# Patient Record
Sex: Male | Born: 1935 | Race: Black or African American | Hispanic: No | Marital: Married | Smoking: Former smoker
Health system: Southern US, Community
[De-identification: ages and names within clinical notes are randomized; demographics above are authoritative.]

## PROBLEM LIST (undated history)

## (undated) DIAGNOSIS — K279 Peptic ulcer, site unspecified, unspecified as acute or chronic, without hemorrhage or perforation: Secondary | ICD-10-CM

## (undated) DIAGNOSIS — E119 Type 2 diabetes mellitus without complications: Secondary | ICD-10-CM

## (undated) DIAGNOSIS — M5126 Other intervertebral disc displacement, lumbar region: Secondary | ICD-10-CM

## (undated) DIAGNOSIS — K219 Gastro-esophageal reflux disease without esophagitis: Secondary | ICD-10-CM

## (undated) DIAGNOSIS — G4730 Sleep apnea, unspecified: Secondary | ICD-10-CM

## (undated) DIAGNOSIS — D369 Benign neoplasm, unspecified site: Secondary | ICD-10-CM

## (undated) DIAGNOSIS — M1990 Unspecified osteoarthritis, unspecified site: Secondary | ICD-10-CM

## (undated) DIAGNOSIS — I10 Essential (primary) hypertension: Secondary | ICD-10-CM

## (undated) DIAGNOSIS — N400 Enlarged prostate without lower urinary tract symptoms: Secondary | ICD-10-CM

## (undated) DIAGNOSIS — D469 Myelodysplastic syndrome, unspecified: Principal | ICD-10-CM

## (undated) DIAGNOSIS — E785 Hyperlipidemia, unspecified: Secondary | ICD-10-CM

## (undated) DIAGNOSIS — K5790 Diverticulosis of intestine, part unspecified, without perforation or abscess without bleeding: Secondary | ICD-10-CM

## (undated) HISTORY — DX: Other intervertebral disc displacement, lumbar region: M51.26

## (undated) HISTORY — DX: Hyperlipidemia, unspecified: E78.5

## (undated) HISTORY — PX: NECK SURGERY: SHX720

## (undated) HISTORY — DX: Essential (primary) hypertension: I10

## (undated) HISTORY — DX: Myelodysplastic syndrome, unspecified: D46.9

## (undated) HISTORY — DX: Sleep apnea, unspecified: G47.30

## (undated) HISTORY — DX: Diverticulosis of intestine, part unspecified, without perforation or abscess without bleeding: K57.90

## (undated) HISTORY — DX: Benign neoplasm, unspecified site: D36.9

## (undated) HISTORY — PX: ESOPHAGOGASTRODUODENOSCOPY: SHX1529

## (undated) HISTORY — DX: Peptic ulcer, site unspecified, unspecified as acute or chronic, without hemorrhage or perforation: K27.9

## (undated) HISTORY — DX: Gastro-esophageal reflux disease without esophagitis: K21.9

## (undated) HISTORY — PX: COLONOSCOPY: SHX174

## (undated) HISTORY — DX: Benign prostatic hyperplasia without lower urinary tract symptoms: N40.0

## (undated) HISTORY — DX: Unspecified osteoarthritis, unspecified site: M19.90

## (undated) HISTORY — DX: Type 2 diabetes mellitus without complications: E11.9

---

## 1998-04-14 ENCOUNTER — Encounter: Admission: RE | Admit: 1998-04-14 | Discharge: 1998-04-14 | Payer: Self-pay | Admitting: Family Medicine

## 1998-05-19 ENCOUNTER — Encounter: Admission: RE | Admit: 1998-05-19 | Discharge: 1998-05-19 | Payer: Self-pay | Admitting: Family Medicine

## 1998-08-08 ENCOUNTER — Encounter (INDEPENDENT_AMBULATORY_CARE_PROVIDER_SITE_OTHER): Payer: Self-pay | Admitting: *Deleted

## 1998-08-08 ENCOUNTER — Other Ambulatory Visit: Admission: RE | Admit: 1998-08-08 | Discharge: 1998-08-08 | Payer: Self-pay | Admitting: Gastroenterology

## 1998-09-17 ENCOUNTER — Encounter: Admission: RE | Admit: 1998-09-17 | Discharge: 1998-09-17 | Payer: Self-pay | Admitting: Family Medicine

## 1998-10-02 ENCOUNTER — Encounter: Admission: RE | Admit: 1998-10-02 | Discharge: 1998-10-02 | Payer: Self-pay | Admitting: Family Medicine

## 1998-10-17 ENCOUNTER — Encounter: Admission: RE | Admit: 1998-10-17 | Discharge: 1998-10-17 | Payer: Self-pay | Admitting: Family Medicine

## 1998-11-11 ENCOUNTER — Encounter: Admission: RE | Admit: 1998-11-11 | Discharge: 1998-11-11 | Payer: Self-pay | Admitting: *Deleted

## 1998-11-21 ENCOUNTER — Ambulatory Visit (HOSPITAL_BASED_OUTPATIENT_CLINIC_OR_DEPARTMENT_OTHER): Admission: RE | Admit: 1998-11-21 | Discharge: 1998-11-21 | Payer: Self-pay

## 1998-12-16 ENCOUNTER — Encounter: Admission: RE | Admit: 1998-12-16 | Discharge: 1998-12-16 | Payer: Self-pay | Admitting: Sports Medicine

## 1999-01-02 ENCOUNTER — Encounter: Admission: RE | Admit: 1999-01-02 | Discharge: 1999-01-02 | Payer: Self-pay | Admitting: Family Medicine

## 1999-01-19 ENCOUNTER — Ambulatory Visit (HOSPITAL_COMMUNITY): Admission: RE | Admit: 1999-01-19 | Discharge: 1999-01-19 | Payer: Self-pay | Admitting: Sports Medicine

## 1999-02-02 ENCOUNTER — Encounter: Admission: RE | Admit: 1999-02-02 | Discharge: 1999-02-02 | Payer: Self-pay | Admitting: Family Medicine

## 1999-02-10 ENCOUNTER — Encounter: Admission: RE | Admit: 1999-02-10 | Discharge: 1999-02-10 | Payer: Self-pay | Admitting: Family Medicine

## 1999-02-11 ENCOUNTER — Encounter: Payer: Self-pay | Admitting: Emergency Medicine

## 1999-02-11 ENCOUNTER — Emergency Department (HOSPITAL_COMMUNITY): Admission: EM | Admit: 1999-02-11 | Discharge: 1999-02-11 | Payer: Self-pay | Admitting: Emergency Medicine

## 1999-02-11 ENCOUNTER — Encounter: Admission: RE | Admit: 1999-02-11 | Discharge: 1999-02-11 | Payer: Self-pay | Admitting: Family Medicine

## 1999-02-16 ENCOUNTER — Encounter: Admission: RE | Admit: 1999-02-16 | Discharge: 1999-02-16 | Payer: Self-pay | Admitting: Family Medicine

## 1999-03-04 ENCOUNTER — Encounter: Admission: RE | Admit: 1999-03-04 | Discharge: 1999-03-04 | Payer: Self-pay | Admitting: Family Medicine

## 1999-03-10 ENCOUNTER — Ambulatory Visit (HOSPITAL_BASED_OUTPATIENT_CLINIC_OR_DEPARTMENT_OTHER): Admission: RE | Admit: 1999-03-10 | Discharge: 1999-03-10 | Payer: Self-pay | Admitting: Urology

## 1999-03-25 ENCOUNTER — Encounter: Admission: RE | Admit: 1999-03-25 | Discharge: 1999-03-25 | Payer: Self-pay | Admitting: Family Medicine

## 1999-04-08 ENCOUNTER — Encounter: Admission: RE | Admit: 1999-04-08 | Discharge: 1999-04-08 | Payer: Self-pay | Admitting: Family Medicine

## 1999-06-23 ENCOUNTER — Encounter: Payer: Self-pay | Admitting: Sports Medicine

## 1999-06-23 ENCOUNTER — Encounter: Admission: RE | Admit: 1999-06-23 | Discharge: 1999-06-23 | Payer: Self-pay | Admitting: Sports Medicine

## 1999-07-03 ENCOUNTER — Encounter: Admission: RE | Admit: 1999-07-03 | Discharge: 1999-07-03 | Payer: Self-pay | Admitting: Sports Medicine

## 1999-07-23 ENCOUNTER — Encounter: Admission: RE | Admit: 1999-07-23 | Discharge: 1999-07-23 | Payer: Self-pay | Admitting: Family Medicine

## 1999-08-03 ENCOUNTER — Encounter: Admission: RE | Admit: 1999-08-03 | Discharge: 1999-08-03 | Payer: Self-pay | Admitting: Family Medicine

## 1999-10-28 ENCOUNTER — Encounter: Admission: RE | Admit: 1999-10-28 | Discharge: 1999-10-28 | Payer: Self-pay | Admitting: Family Medicine

## 1999-10-30 ENCOUNTER — Encounter: Payer: Self-pay | Admitting: Family Medicine

## 1999-10-30 ENCOUNTER — Encounter: Admission: RE | Admit: 1999-10-30 | Discharge: 1999-10-30 | Payer: Self-pay | Admitting: *Deleted

## 1999-11-16 ENCOUNTER — Encounter: Admission: RE | Admit: 1999-11-16 | Discharge: 1999-11-16 | Payer: Self-pay | Admitting: Family Medicine

## 1999-12-23 ENCOUNTER — Encounter: Admission: RE | Admit: 1999-12-23 | Discharge: 1999-12-23 | Payer: Self-pay | Admitting: Family Medicine

## 2000-02-08 ENCOUNTER — Encounter: Admission: RE | Admit: 2000-02-08 | Discharge: 2000-02-08 | Payer: Self-pay | Admitting: Family Medicine

## 2000-02-08 ENCOUNTER — Encounter: Payer: Self-pay | Admitting: *Deleted

## 2000-02-08 ENCOUNTER — Encounter: Admission: RE | Admit: 2000-02-08 | Discharge: 2000-02-08 | Payer: Self-pay | Admitting: *Deleted

## 2000-02-24 ENCOUNTER — Encounter: Admission: RE | Admit: 2000-02-24 | Discharge: 2000-02-24 | Payer: Self-pay | Admitting: Family Medicine

## 2000-04-06 ENCOUNTER — Encounter: Admission: RE | Admit: 2000-04-06 | Discharge: 2000-04-06 | Payer: Self-pay | Admitting: Family Medicine

## 2000-04-29 ENCOUNTER — Encounter: Admission: RE | Admit: 2000-04-29 | Discharge: 2000-04-29 | Payer: Self-pay | Admitting: Family Medicine

## 2000-05-11 ENCOUNTER — Encounter: Admission: RE | Admit: 2000-05-11 | Discharge: 2000-05-11 | Payer: Self-pay | Admitting: Family Medicine

## 2000-06-01 ENCOUNTER — Encounter: Admission: RE | Admit: 2000-06-01 | Discharge: 2000-06-01 | Payer: Self-pay | Admitting: Family Medicine

## 2000-07-28 ENCOUNTER — Encounter: Admission: RE | Admit: 2000-07-28 | Discharge: 2000-07-28 | Payer: Self-pay | Admitting: Family Medicine

## 2001-04-25 ENCOUNTER — Encounter: Admission: RE | Admit: 2001-04-25 | Discharge: 2001-04-25 | Payer: Self-pay | Admitting: Sports Medicine

## 2001-10-23 ENCOUNTER — Encounter: Admission: RE | Admit: 2001-10-23 | Discharge: 2001-10-23 | Payer: Self-pay | Admitting: Family Medicine

## 2001-11-09 ENCOUNTER — Encounter: Admission: RE | Admit: 2001-11-09 | Discharge: 2001-11-09 | Payer: Self-pay | Admitting: Family Medicine

## 2001-11-13 ENCOUNTER — Encounter: Admission: RE | Admit: 2001-11-13 | Discharge: 2001-11-13 | Payer: Self-pay | Admitting: Family Medicine

## 2002-01-23 ENCOUNTER — Encounter: Admission: RE | Admit: 2002-01-23 | Discharge: 2002-01-23 | Payer: Self-pay | Admitting: Family Medicine

## 2002-01-28 ENCOUNTER — Emergency Department (HOSPITAL_COMMUNITY): Admission: EM | Admit: 2002-01-28 | Discharge: 2002-01-28 | Payer: Self-pay | Admitting: Emergency Medicine

## 2002-01-28 ENCOUNTER — Encounter: Payer: Self-pay | Admitting: Emergency Medicine

## 2002-02-06 ENCOUNTER — Encounter: Admission: RE | Admit: 2002-02-06 | Discharge: 2002-02-06 | Payer: Self-pay | Admitting: Family Medicine

## 2002-02-07 ENCOUNTER — Encounter: Payer: Self-pay | Admitting: Sports Medicine

## 2002-02-07 ENCOUNTER — Encounter: Admission: RE | Admit: 2002-02-07 | Discharge: 2002-02-07 | Payer: Self-pay | Admitting: Sports Medicine

## 2002-02-13 ENCOUNTER — Encounter: Admission: RE | Admit: 2002-02-13 | Discharge: 2002-02-13 | Payer: Self-pay | Admitting: Family Medicine

## 2002-02-26 ENCOUNTER — Emergency Department (HOSPITAL_COMMUNITY): Admission: EM | Admit: 2002-02-26 | Discharge: 2002-02-27 | Payer: Self-pay | Admitting: *Deleted

## 2002-03-02 ENCOUNTER — Encounter: Admission: RE | Admit: 2002-03-02 | Discharge: 2002-03-02 | Payer: Self-pay | Admitting: Family Medicine

## 2002-04-03 ENCOUNTER — Encounter: Admission: RE | Admit: 2002-04-03 | Discharge: 2002-04-03 | Payer: Self-pay | Admitting: Family Medicine

## 2002-08-07 ENCOUNTER — Encounter: Admission: RE | Admit: 2002-08-07 | Discharge: 2002-08-07 | Payer: Self-pay | Admitting: Family Medicine

## 2002-08-07 ENCOUNTER — Encounter: Admission: RE | Admit: 2002-08-07 | Discharge: 2002-08-07 | Payer: Self-pay | Admitting: Sports Medicine

## 2002-08-07 ENCOUNTER — Encounter: Payer: Self-pay | Admitting: Sports Medicine

## 2002-08-30 ENCOUNTER — Encounter: Admission: RE | Admit: 2002-08-30 | Discharge: 2002-08-30 | Payer: Self-pay | Admitting: Sports Medicine

## 2002-08-30 ENCOUNTER — Encounter: Admission: RE | Admit: 2002-08-30 | Discharge: 2002-08-30 | Payer: Self-pay | Admitting: Family Medicine

## 2002-08-30 ENCOUNTER — Encounter: Payer: Self-pay | Admitting: Sports Medicine

## 2002-10-01 ENCOUNTER — Encounter: Admission: RE | Admit: 2002-10-01 | Discharge: 2002-10-01 | Payer: Self-pay | Admitting: Family Medicine

## 2002-12-17 ENCOUNTER — Encounter: Payer: Self-pay | Admitting: Family Medicine

## 2002-12-17 ENCOUNTER — Inpatient Hospital Stay (HOSPITAL_COMMUNITY): Admission: AD | Admit: 2002-12-17 | Discharge: 2002-12-19 | Payer: Self-pay | Admitting: Family Medicine

## 2002-12-17 ENCOUNTER — Encounter: Admission: RE | Admit: 2002-12-17 | Discharge: 2002-12-17 | Payer: Self-pay | Admitting: Sports Medicine

## 2002-12-19 ENCOUNTER — Encounter: Payer: Self-pay | Admitting: Cardiovascular Disease

## 2002-12-19 ENCOUNTER — Encounter: Payer: Self-pay | Admitting: Cardiology

## 2002-12-28 ENCOUNTER — Encounter: Admission: RE | Admit: 2002-12-28 | Discharge: 2002-12-28 | Payer: Self-pay | Admitting: Family Medicine

## 2003-01-30 ENCOUNTER — Encounter: Admission: RE | Admit: 2003-01-30 | Discharge: 2003-01-30 | Payer: Self-pay | Admitting: Family Medicine

## 2003-02-28 ENCOUNTER — Encounter: Admission: RE | Admit: 2003-02-28 | Discharge: 2003-02-28 | Payer: Self-pay | Admitting: Family Medicine

## 2003-09-02 ENCOUNTER — Encounter: Admission: RE | Admit: 2003-09-02 | Discharge: 2003-09-02 | Payer: Self-pay | Admitting: Family Medicine

## 2004-03-06 ENCOUNTER — Ambulatory Visit (HOSPITAL_COMMUNITY): Admission: RE | Admit: 2004-03-06 | Discharge: 2004-03-06 | Payer: Self-pay | Admitting: Surgery

## 2007-12-14 ENCOUNTER — Encounter: Payer: Self-pay | Admitting: Internal Medicine

## 2008-01-05 ENCOUNTER — Ambulatory Visit: Payer: Self-pay | Admitting: Internal Medicine

## 2008-01-15 ENCOUNTER — Telehealth: Payer: Self-pay | Admitting: Internal Medicine

## 2008-02-06 ENCOUNTER — Ambulatory Visit: Payer: Self-pay | Admitting: Oncology

## 2008-02-08 ENCOUNTER — Ambulatory Visit (HOSPITAL_COMMUNITY): Admission: RE | Admit: 2008-02-08 | Discharge: 2008-02-08 | Payer: Self-pay | Admitting: Oncology

## 2008-02-08 LAB — CBC WITH DIFFERENTIAL/PLATELET
BASO%: 0.1 % (ref 0.0–2.0)
Basophils Absolute: 0 10*3/uL (ref 0.0–0.1)
EOS%: 2.5 % (ref 0.0–7.0)
HGB: 13.5 g/dL (ref 13.0–17.1)
MCH: 30.4 pg (ref 28.0–33.4)
MCV: 86.8 fL (ref 81.6–98.0)
MONO%: 10.8 % (ref 0.0–13.0)
RBC: 4.43 10*6/uL (ref 4.20–5.71)
RDW: 15.8 % — ABNORMAL HIGH (ref 11.2–14.6)
lymph#: 2.3 10*3/uL (ref 0.9–3.3)

## 2008-02-08 LAB — CHCC SMEAR

## 2008-02-12 LAB — BETA 2 MICROGLOBULIN, SERUM: Beta-2 Microglobulin: 3.05 mg/L — ABNORMAL HIGH (ref 1.01–1.73)

## 2008-02-12 LAB — COMPREHENSIVE METABOLIC PANEL
ALT: 19 U/L (ref 0–53)
AST: 27 U/L (ref 0–37)
Albumin: 3.6 g/dL (ref 3.5–5.2)
Alkaline Phosphatase: 39 U/L (ref 39–117)
Potassium: 4.1 mEq/L (ref 3.5–5.3)
Sodium: 133 mEq/L — ABNORMAL LOW (ref 135–145)
Total Bilirubin: 0.3 mg/dL (ref 0.3–1.2)
Total Protein: 9 g/dL — ABNORMAL HIGH (ref 6.0–8.3)

## 2008-02-12 LAB — IMMUNOFIXATION ELECTROPHORESIS
IgM, Serum: 34 mg/dL — ABNORMAL LOW (ref 60–263)
Total Protein, Serum Electrophoresis: 9 g/dL — ABNORMAL HIGH (ref 6.0–8.3)

## 2008-02-12 LAB — KAPPA/LAMBDA LIGHT CHAINS: Kappa:Lambda Ratio: 0.82 (ref 0.26–1.65)

## 2008-02-14 LAB — UIFE/LIGHT CHAINS/TP QN, 24-HR UR
Free Kappa Lt Chains,Ur: 3.58 mg/dL — ABNORMAL HIGH (ref 0.04–1.51)
Free Lambda Lt Chains,Ur: 0.23 mg/dL (ref 0.08–1.01)
Free Lt Chn Excr Rate: 35.8 mg/d
Gamma Globulin, Urine: DETECTED — AB
Volume, Urine: 1000 mL

## 2008-02-16 ENCOUNTER — Ambulatory Visit: Payer: Self-pay | Admitting: Internal Medicine

## 2008-02-16 ENCOUNTER — Encounter: Payer: Self-pay | Admitting: Internal Medicine

## 2008-02-16 DIAGNOSIS — D369 Benign neoplasm, unspecified site: Secondary | ICD-10-CM

## 2008-02-16 HISTORY — DX: Benign neoplasm, unspecified site: D36.9

## 2008-03-01 ENCOUNTER — Encounter: Payer: Self-pay | Admitting: Internal Medicine

## 2008-03-08 ENCOUNTER — Encounter: Payer: Self-pay | Admitting: Oncology

## 2008-03-08 ENCOUNTER — Other Ambulatory Visit: Admission: RE | Admit: 2008-03-08 | Discharge: 2008-03-08 | Payer: Self-pay | Admitting: Oncology

## 2008-03-19 ENCOUNTER — Ambulatory Visit: Payer: Self-pay | Admitting: Oncology

## 2008-03-31 ENCOUNTER — Encounter: Admission: RE | Admit: 2008-03-31 | Discharge: 2008-03-31 | Payer: Self-pay | Admitting: Oncology

## 2008-04-19 LAB — COMPREHENSIVE METABOLIC PANEL
ALT: 24 U/L (ref 0–53)
AST: 47 U/L — ABNORMAL HIGH (ref 0–37)
CO2: 22 mEq/L (ref 19–32)
Calcium: 8.8 mg/dL (ref 8.4–10.5)
Chloride: 106 mEq/L (ref 96–112)
Creatinine, Ser: 1.28 mg/dL (ref 0.40–1.50)
Sodium: 137 mEq/L (ref 135–145)
Total Bilirubin: 0.3 mg/dL (ref 0.3–1.2)
Total Protein: 8.1 g/dL (ref 6.0–8.3)

## 2008-04-19 LAB — CBC WITH DIFFERENTIAL/PLATELET
BASO%: 0 % (ref 0.0–2.0)
EOS%: 4.2 % (ref 0.0–7.0)
MCH: 29.8 pg (ref 28.0–33.4)
MCHC: 34.8 g/dL (ref 32.0–35.9)
MONO#: 0.5 10*3/uL (ref 0.1–0.9)
RBC: 3.9 10*6/uL — ABNORMAL LOW (ref 4.20–5.71)
WBC: 5.4 10*3/uL (ref 4.0–10.0)
lymph#: 2.4 10*3/uL (ref 0.9–3.3)

## 2008-05-08 ENCOUNTER — Ambulatory Visit: Payer: Self-pay | Admitting: Oncology

## 2008-05-10 LAB — CBC WITH DIFFERENTIAL/PLATELET
BASO%: 0.6 % (ref 0.0–2.0)
EOS%: 2 % (ref 0.0–7.0)
HCT: 35.5 % — ABNORMAL LOW (ref 38.7–49.9)
LYMPH%: 42.9 % (ref 14.0–48.0)
MCH: 30 pg (ref 28.0–33.4)
MCHC: 35 g/dL (ref 32.0–35.9)
MONO#: 0.4 10*3/uL (ref 0.1–0.9)
NEUT%: 47.6 % (ref 40.0–75.0)
Platelets: 223 10*3/uL (ref 145–400)
RBC: 4.14 10*6/uL — ABNORMAL LOW (ref 4.20–5.71)
WBC: 5.7 10*3/uL (ref 4.0–10.0)

## 2008-05-17 ENCOUNTER — Ambulatory Visit (HOSPITAL_COMMUNITY): Admission: RE | Admit: 2008-05-17 | Discharge: 2008-05-17 | Payer: Self-pay | Admitting: Oncology

## 2008-05-24 LAB — CBC WITH DIFFERENTIAL/PLATELET
BASO%: 0.7 % (ref 0.0–2.0)
LYMPH%: 36.1 % (ref 14.0–48.0)
MCHC: 34.3 g/dL (ref 32.0–35.9)
MONO#: 0.5 10*3/uL (ref 0.1–0.9)
MONO%: 8 % (ref 0.0–13.0)
Platelets: 245 10*3/uL (ref 145–400)
RBC: 4.33 10*6/uL (ref 4.20–5.71)
RDW: 15.6 % — ABNORMAL HIGH (ref 11.2–14.6)
WBC: 5.9 10*3/uL (ref 4.0–10.0)

## 2008-05-24 LAB — COMPREHENSIVE METABOLIC PANEL
ALT: 20 U/L (ref 0–53)
Alkaline Phosphatase: 42 U/L (ref 39–117)
CO2: 25 mEq/L (ref 19–32)
Potassium: 4.2 mEq/L (ref 3.5–5.3)
Sodium: 134 mEq/L — ABNORMAL LOW (ref 135–145)
Total Bilirubin: 0.4 mg/dL (ref 0.3–1.2)
Total Protein: 8.7 g/dL — ABNORMAL HIGH (ref 6.0–8.3)

## 2008-06-07 LAB — CBC WITH DIFFERENTIAL/PLATELET
BASO%: 0 % (ref 0.0–2.0)
EOS%: 2.2 % (ref 0.0–7.0)
HCT: 36.7 % — ABNORMAL LOW (ref 38.7–49.9)
HGB: 12.8 g/dL — ABNORMAL LOW (ref 13.0–17.1)
LYMPH%: 46 % (ref 14.0–48.0)
MONO%: 18.4 % — ABNORMAL HIGH (ref 0.0–13.0)
WBC: 4.1 10*3/uL (ref 4.0–10.0)

## 2008-06-07 LAB — TECHNOLOGIST REVIEW

## 2008-06-26 ENCOUNTER — Ambulatory Visit: Payer: Self-pay | Admitting: Oncology

## 2008-07-10 LAB — CBC WITH DIFFERENTIAL/PLATELET
BASO%: 0.9 % (ref 0.0–2.0)
EOS%: 2.5 % (ref 0.0–7.0)
Eosinophils Absolute: 0.1 10*3/uL (ref 0.0–0.5)
LYMPH%: 41 % (ref 14.0–48.0)
MCH: 29.1 pg (ref 28.0–33.4)
MCHC: 35 g/dL (ref 32.0–35.9)
MCV: 83 fL (ref 81.6–98.0)
MONO%: 9.4 % (ref 0.0–13.0)
Platelets: 208 10*3/uL (ref 145–400)
RBC: 4.29 10*6/uL (ref 4.20–5.71)
RDW: 15.4 % — ABNORMAL HIGH (ref 11.2–14.6)

## 2008-07-11 LAB — IGG, IGA, IGM
IgA: 87 mg/dL (ref 68–378)
IgG (Immunoglobin G), Serum: 2860 mg/dL — ABNORMAL HIGH (ref 694–1618)

## 2008-07-11 LAB — BASIC METABOLIC PANEL
Calcium: 8.9 mg/dL (ref 8.4–10.5)
Glucose, Bld: 85 mg/dL (ref 70–99)
Potassium: 4.3 mEq/L (ref 3.5–5.3)
Sodium: 135 mEq/L (ref 135–145)

## 2008-08-02 ENCOUNTER — Ambulatory Visit: Payer: Self-pay | Admitting: Oncology

## 2008-08-02 LAB — CBC WITH DIFFERENTIAL/PLATELET
HGB: 12.1 g/dL — ABNORMAL LOW (ref 13.0–17.1)
MCV: 85.3 fL (ref 81.6–98.0)
Platelets: 188 10*3/uL (ref 145–400)
RDW: 17.5 % — ABNORMAL HIGH (ref 11.2–14.6)
WBC: 3.6 10*3/uL — ABNORMAL LOW (ref 4.0–10.0)

## 2008-08-02 LAB — MANUAL DIFFERENTIAL
ALC: 1.7 10*3/uL (ref 0.9–3.3)
Band Neutrophils: 0 % (ref 0–10)
EOS: 2 % (ref 0–7)
MONO: 10 % (ref 0–14)
Myelocytes: 0 % (ref 0–0)
Other Cell: 0 % (ref 0–0)
PLT EST: ADEQUATE
PROMYELO: 0 % (ref 0–0)
SEG: 37 % — ABNORMAL LOW (ref 38–77)
Variant Lymph: 0 % (ref 0–0)
nRBC: 0 % (ref 0–0)

## 2008-08-02 LAB — BASIC METABOLIC PANEL
BUN: 19 mg/dL (ref 6–23)
CO2: 24 mEq/L (ref 19–32)
Calcium: 8.9 mg/dL (ref 8.4–10.5)
Chloride: 105 mEq/L (ref 96–112)
Creatinine, Ser: 1.15 mg/dL (ref 0.40–1.50)
Glucose, Bld: 78 mg/dL (ref 70–99)

## 2008-08-02 LAB — IGG, IGA, IGM: IgM, Serum: 55 mg/dL — ABNORMAL LOW (ref 60–263)

## 2008-08-30 LAB — COMPREHENSIVE METABOLIC PANEL
AST: 30 U/L (ref 0–37)
Albumin: 3.4 g/dL — ABNORMAL LOW (ref 3.5–5.2)
BUN: 24 mg/dL — ABNORMAL HIGH (ref 6–23)
CO2: 29 mEq/L (ref 19–32)
Calcium: 9.1 mg/dL (ref 8.4–10.5)
Chloride: 101 mEq/L (ref 96–112)
Glucose, Bld: 79 mg/dL (ref 70–99)
Potassium: 3.7 mEq/L (ref 3.5–5.3)

## 2008-08-30 LAB — CBC WITH DIFFERENTIAL/PLATELET
Basophils Absolute: 0 10*3/uL (ref 0.0–0.1)
Eosinophils Absolute: 0.1 10*3/uL (ref 0.0–0.5)
HGB: 12.7 g/dL — ABNORMAL LOW (ref 13.0–17.1)
NEUT#: 1.9 10*3/uL (ref 1.5–6.5)
RDW: 17 % — ABNORMAL HIGH (ref 11.0–14.6)
lymph#: 1.6 10*3/uL (ref 0.9–3.3)

## 2008-09-06 LAB — IGG, IGA, IGM: IgM, Serum: 35 mg/dL — ABNORMAL LOW (ref 60–263)

## 2008-09-06 LAB — PROTEIN ELECTROPHORESIS, SERUM
Albumin ELP: 46.7 % — ABNORMAL LOW (ref 55.8–66.1)
Alpha-1-Globulin: 6.1 % — ABNORMAL HIGH (ref 2.9–4.9)
Alpha-2-Globulin: 8.4 % (ref 7.1–11.8)
Beta 2: 2.5 % — ABNORMAL LOW (ref 3.2–6.5)
Gamma Globulin: 31.8 % — ABNORMAL HIGH (ref 11.1–18.8)

## 2008-09-25 ENCOUNTER — Ambulatory Visit: Payer: Self-pay | Admitting: Oncology

## 2008-09-27 ENCOUNTER — Encounter: Payer: Self-pay | Admitting: Oncology

## 2008-09-27 ENCOUNTER — Ambulatory Visit: Payer: Self-pay | Admitting: Vascular Surgery

## 2008-09-27 ENCOUNTER — Ambulatory Visit (HOSPITAL_COMMUNITY): Admission: RE | Admit: 2008-09-27 | Discharge: 2008-09-27 | Payer: Self-pay | Admitting: Oncology

## 2008-09-27 LAB — CBC WITH DIFFERENTIAL/PLATELET
BASO%: 0.3 % (ref 0.0–2.0)
EOS%: 2.3 % (ref 0.0–7.0)
HCT: 36.3 % — ABNORMAL LOW (ref 38.4–49.9)
LYMPH%: 34.2 % (ref 14.0–49.0)
MCH: 28.9 pg (ref 27.2–33.4)
MCHC: 36.1 g/dL — ABNORMAL HIGH (ref 32.0–36.0)
MONO#: 0.4 10*3/uL (ref 0.1–0.9)
NEUT%: 52.2 % (ref 39.0–75.0)
RBC: 4.54 10*6/uL (ref 4.20–5.82)
WBC: 3.5 10*3/uL — ABNORMAL LOW (ref 4.0–10.3)
lymph#: 1.2 10*3/uL (ref 0.9–3.3)
nRBC: 0 % (ref 0–0)

## 2008-10-01 LAB — IGG, IGA, IGM
IgA: 80 mg/dL (ref 68–378)
IgG (Immunoglobin G), Serum: 2850 mg/dL — ABNORMAL HIGH (ref 694–1618)

## 2008-10-01 LAB — BASIC METABOLIC PANEL
Chloride: 103 mEq/L (ref 96–112)
Glucose, Bld: 78 mg/dL (ref 70–99)
Potassium: 4.1 mEq/L (ref 3.5–5.3)
Sodium: 136 mEq/L (ref 135–145)

## 2008-10-01 LAB — PROTEIN ELECTROPHORESIS, SERUM
Albumin ELP: 48 % — ABNORMAL LOW (ref 55.8–66.1)
Alpha-1-Globulin: 3.2 % (ref 2.9–4.9)
Beta Globulin: 4.2 % — ABNORMAL LOW (ref 4.7–7.2)
Total Protein, Serum Electrophoresis: 7.8 g/dL (ref 6.0–8.3)

## 2008-10-07 ENCOUNTER — Ambulatory Visit (HOSPITAL_COMMUNITY): Admission: RE | Admit: 2008-10-07 | Discharge: 2008-10-07 | Payer: Self-pay | Admitting: Family Medicine

## 2008-10-11 LAB — CBC WITH DIFFERENTIAL/PLATELET
BASO%: 0.8 % (ref 0.0–2.0)
EOS%: 2.1 % (ref 0.0–7.0)
HCT: 36 % — ABNORMAL LOW (ref 38.4–49.9)
MCH: 28.7 pg (ref 27.2–33.4)
MCHC: 35.8 g/dL (ref 32.0–36.0)
MCV: 80.2 fL (ref 79.3–98.0)
MONO%: 10.5 % (ref 0.0–14.0)
NEUT%: 46.9 % (ref 39.0–75.0)
RDW: 17.4 % — ABNORMAL HIGH (ref 11.0–14.6)
lymph#: 1.6 10*3/uL (ref 0.9–3.3)

## 2008-11-01 ENCOUNTER — Ambulatory Visit (HOSPITAL_COMMUNITY): Admission: RE | Admit: 2008-11-01 | Discharge: 2008-11-01 | Payer: Self-pay | Admitting: Oncology

## 2008-11-01 LAB — CBC WITH DIFFERENTIAL/PLATELET
BASO%: 0 % (ref 0.0–2.0)
Basophils Absolute: 0 10*3/uL (ref 0.0–0.1)
EOS%: 6.3 % (ref 0.0–7.0)
HGB: 12 g/dL — ABNORMAL LOW (ref 13.0–17.1)
MCH: 30.1 pg (ref 27.2–33.4)
MCHC: 34.8 g/dL (ref 32.0–36.0)
MONO%: 20.7 % — ABNORMAL HIGH (ref 0.0–14.0)
RBC: 3.98 10*6/uL — ABNORMAL LOW (ref 4.20–5.82)
RDW: 19.3 % — ABNORMAL HIGH (ref 11.0–14.6)
lymph#: 1 10*3/uL (ref 0.9–3.3)

## 2008-11-05 LAB — COMPREHENSIVE METABOLIC PANEL
ALT: 21 U/L (ref 0–53)
AST: 28 U/L (ref 0–37)
Albumin: 3.3 g/dL — ABNORMAL LOW (ref 3.5–5.2)
Alkaline Phosphatase: 44 U/L (ref 39–117)
Calcium: 8.4 mg/dL (ref 8.4–10.5)
Chloride: 104 mEq/L (ref 96–112)
Potassium: 4.1 mEq/L (ref 3.5–5.3)
Sodium: 139 mEq/L (ref 135–145)
Total Protein: 7.7 g/dL (ref 6.0–8.3)

## 2008-11-05 LAB — PROTEIN ELECTROPHORESIS, SERUM
Beta 2: 2.9 % — ABNORMAL LOW (ref 3.2–6.5)
Gamma Globulin: 30.5 % — ABNORMAL HIGH (ref 11.1–18.8)
M-Spike, %: 2.13 g/dL

## 2008-11-27 ENCOUNTER — Ambulatory Visit: Payer: Self-pay | Admitting: Oncology

## 2008-11-29 LAB — CBC WITH DIFFERENTIAL/PLATELET
BASO%: 1.2 % (ref 0.0–2.0)
HGB: 12.2 g/dL — ABNORMAL LOW (ref 13.0–17.1)
LYMPH%: 48 % (ref 14.0–49.0)
MCHC: 36.2 g/dL — ABNORMAL HIGH (ref 32.0–36.0)
MONO#: 0.4 10*3/uL (ref 0.1–0.9)
NEUT#: 1 10*3/uL — ABNORMAL LOW (ref 1.5–6.5)
NEUT%: 32.1 % — ABNORMAL LOW (ref 39.0–75.0)
Platelets: 181 10*3/uL (ref 140–400)

## 2008-12-05 LAB — CBC WITH DIFFERENTIAL/PLATELET
BASO%: 0.6 % (ref 0.0–2.0)
LYMPH%: 42.9 % (ref 14.0–49.0)
MCHC: 36 g/dL (ref 32.0–36.0)
MCV: 81.6 fL (ref 79.3–98.0)
MONO%: 14.5 % — ABNORMAL HIGH (ref 0.0–14.0)
NEUT%: 36.8 % — ABNORMAL LOW (ref 39.0–75.0)
Platelets: 186 10*3/uL (ref 140–400)
RBC: 4.12 10*6/uL — ABNORMAL LOW (ref 4.20–5.82)
nRBC: 0 % (ref 0–0)

## 2008-12-12 LAB — CBC WITH DIFFERENTIAL/PLATELET
BASO%: 0.8 % (ref 0.0–2.0)
MCH: 29.1 pg (ref 27.2–33.4)
MCV: 81.1 fL (ref 79.3–98.0)
MONO%: 15.4 % — ABNORMAL HIGH (ref 0.0–14.0)
RBC: 3.81 10*6/uL — ABNORMAL LOW (ref 4.20–5.82)
RDW: 17.9 % — ABNORMAL HIGH (ref 11.0–14.6)

## 2008-12-27 LAB — CBC WITH DIFFERENTIAL/PLATELET
Basophils Absolute: 0 10*3/uL (ref 0.0–0.1)
Eosinophils Absolute: 0.1 10*3/uL (ref 0.0–0.5)
HCT: 31.6 % — ABNORMAL LOW (ref 38.4–49.9)
HGB: 11.3 g/dL — ABNORMAL LOW (ref 13.0–17.1)
LYMPH%: 36.8 % (ref 14.0–49.0)
MCHC: 35.8 g/dL (ref 32.0–36.0)
MONO#: 0.5 10*3/uL (ref 0.1–0.9)
NEUT%: 43.8 % (ref 39.0–75.0)
Platelets: 184 10*3/uL (ref 140–400)
WBC: 3.2 10*3/uL — ABNORMAL LOW (ref 4.0–10.3)
lymph#: 1.2 10*3/uL (ref 0.9–3.3)

## 2008-12-27 LAB — COMPREHENSIVE METABOLIC PANEL
BUN: 13 mg/dL (ref 6–23)
CO2: 24 mEq/L (ref 19–32)
Calcium: 8.5 mg/dL (ref 8.4–10.5)
Chloride: 104 mEq/L (ref 96–112)
Creatinine, Ser: 1 mg/dL (ref 0.40–1.50)
Glucose, Bld: 131 mg/dL — ABNORMAL HIGH (ref 70–99)
Total Bilirubin: 0.6 mg/dL (ref 0.3–1.2)

## 2008-12-31 LAB — KAPPA/LAMBDA LIGHT CHAINS
Kappa free light chain: 1.59 mg/dL (ref 0.33–1.94)
Kappa:Lambda Ratio: 1.53 (ref 0.26–1.65)
Lambda Free Lght Chn: 1.04 mg/dL (ref 0.57–2.63)

## 2008-12-31 LAB — PROTEIN ELECTROPHORESIS, SERUM
Albumin ELP: 51.5 % — ABNORMAL LOW (ref 55.8–66.1)
Alpha-2-Globulin: 8.9 % (ref 7.1–11.8)
Beta Globulin: 4.4 % — ABNORMAL LOW (ref 4.7–7.2)
M-Spike, %: 2 g/dL
Total Protein, Serum Electrophoresis: 7.6 g/dL (ref 6.0–8.3)

## 2009-01-08 ENCOUNTER — Ambulatory Visit: Payer: Self-pay | Admitting: Oncology

## 2009-01-10 LAB — CBC WITH DIFFERENTIAL/PLATELET
BASO%: 1.2 % (ref 0.0–2.0)
EOS%: 5 % (ref 0.0–7.0)
LYMPH%: 39.4 % (ref 14.0–49.0)
MCH: 30.4 pg (ref 27.2–33.4)
MCHC: 36.3 g/dL — ABNORMAL HIGH (ref 32.0–36.0)
MCV: 83.8 fL (ref 79.3–98.0)
MONO%: 18.7 % — ABNORMAL HIGH (ref 0.0–14.0)
Platelets: 188 10*3/uL (ref 140–400)
RBC: 4.08 10*6/uL — ABNORMAL LOW (ref 4.20–5.82)

## 2009-01-17 LAB — CBC WITH DIFFERENTIAL/PLATELET
BASO%: 1.6 % (ref 0.0–2.0)
LYMPH%: 49.2 % — ABNORMAL HIGH (ref 14.0–49.0)
MCHC: 36 g/dL (ref 32.0–36.0)
MONO#: 0.5 10*3/uL (ref 0.1–0.9)
Platelets: 174 10*3/uL (ref 140–400)
RBC: 4.37 10*6/uL (ref 4.20–5.82)
RDW: 17.7 % — ABNORMAL HIGH (ref 11.0–14.6)
WBC: 3.7 10*3/uL — ABNORMAL LOW (ref 4.0–10.3)
lymph#: 1.8 10*3/uL (ref 0.9–3.3)
nRBC: 0 % (ref 0–0)

## 2009-02-07 ENCOUNTER — Ambulatory Visit: Payer: Self-pay | Admitting: Oncology

## 2009-02-07 LAB — CBC WITH DIFFERENTIAL/PLATELET
BASO%: 1.4 % (ref 0.0–2.0)
Basophils Absolute: 0 10*3/uL (ref 0.0–0.1)
EOS%: 3 % (ref 0.0–7.0)
HGB: 13.3 g/dL (ref 13.0–17.1)
MCH: 30.4 pg (ref 27.2–33.4)
MCHC: 36.1 g/dL — ABNORMAL HIGH (ref 32.0–36.0)
RDW: 17 % — ABNORMAL HIGH (ref 11.0–14.6)
lymph#: 1.1 10*3/uL (ref 0.9–3.3)

## 2009-02-07 LAB — COMPREHENSIVE METABOLIC PANEL
ALT: 19 U/L (ref 0–53)
AST: 20 U/L (ref 0–37)
Albumin: 3.7 g/dL (ref 3.5–5.2)
Alkaline Phosphatase: 46 U/L (ref 39–117)
Potassium: 4 mEq/L (ref 3.5–5.3)
Sodium: 137 mEq/L (ref 135–145)
Total Protein: 7.6 g/dL (ref 6.0–8.3)

## 2009-02-18 LAB — COMPREHENSIVE METABOLIC PANEL
ALT: 22 U/L (ref 0–53)
AST: 27 U/L (ref 0–37)
Albumin: 3.4 g/dL — ABNORMAL LOW (ref 3.5–5.2)
Alkaline Phosphatase: 42 U/L (ref 39–117)
Glucose, Bld: 84 mg/dL (ref 70–99)
Potassium: 3.8 mEq/L (ref 3.5–5.3)
Sodium: 135 mEq/L (ref 135–145)
Total Bilirubin: 0.4 mg/dL (ref 0.3–1.2)
Total Protein: 7.7 g/dL (ref 6.0–8.3)

## 2009-02-18 LAB — CBC WITH DIFFERENTIAL/PLATELET
BASO%: 0.5 % (ref 0.0–2.0)
Eosinophils Absolute: 0.2 10*3/uL (ref 0.0–0.5)
MCHC: 36.2 g/dL — ABNORMAL HIGH (ref 32.0–36.0)
MCV: 83.6 fL (ref 79.3–98.0)
MONO#: 0.6 10*3/uL (ref 0.1–0.9)
MONO%: 15.9 % — ABNORMAL HIGH (ref 0.0–14.0)
NEUT#: 1.9 10*3/uL (ref 1.5–6.5)
RBC: 4.46 10*6/uL (ref 4.20–5.82)
RDW: 16.6 % — ABNORMAL HIGH (ref 11.0–14.6)
WBC: 4 10*3/uL (ref 4.0–10.3)

## 2009-02-20 LAB — PROTEIN ELECTROPHORESIS, SERUM
Alpha-2-Globulin: 7.6 % (ref 7.1–11.8)
Beta Globulin: 4.4 % — ABNORMAL LOW (ref 4.7–7.2)
Gamma Globulin: 29.1 % — ABNORMAL HIGH (ref 11.1–18.8)
M-Spike, %: 2 g/dL

## 2009-03-13 ENCOUNTER — Ambulatory Visit: Payer: Self-pay | Admitting: Oncology

## 2009-03-18 LAB — CBC WITH DIFFERENTIAL/PLATELET
EOS%: 3.3 % (ref 0.0–7.0)
LYMPH%: 32.5 % (ref 14.0–49.0)
MCH: 30.6 pg (ref 27.2–33.4)
MCHC: 36.7 g/dL — ABNORMAL HIGH (ref 32.0–36.0)
MCV: 83.3 fL (ref 79.3–98.0)
MONO%: 15.2 % — ABNORMAL HIGH (ref 0.0–14.0)
RBC: 4.48 10*6/uL (ref 4.20–5.82)
RDW: 16.6 % — ABNORMAL HIGH (ref 11.0–14.6)
nRBC: 0 % (ref 0–0)

## 2009-03-20 LAB — PROTEIN ELECTROPHORESIS, SERUM
Gamma Globulin: 27.2 % — ABNORMAL HIGH (ref 11.1–18.8)
M-Spike, %: 1.68 g/dL
Total Protein, Serum Electrophoresis: 6.9 g/dL (ref 6.0–8.3)

## 2009-03-28 ENCOUNTER — Ambulatory Visit (HOSPITAL_COMMUNITY): Admission: RE | Admit: 2009-03-28 | Discharge: 2009-03-28 | Payer: Self-pay | Admitting: Oncology

## 2009-05-07 ENCOUNTER — Ambulatory Visit: Payer: Self-pay | Admitting: Oncology

## 2009-05-09 LAB — CBC WITH DIFFERENTIAL/PLATELET
BASO%: 0.2 % (ref 0.0–2.0)
LYMPH%: 42.3 % (ref 14.0–49.0)
MCHC: 35.9 g/dL (ref 32.0–36.0)
MONO#: 0.5 10*3/uL (ref 0.1–0.9)
Platelets: 176 10*3/uL (ref 140–400)
RBC: 4.29 10*6/uL (ref 4.20–5.82)
WBC: 4.4 10*3/uL (ref 4.0–10.3)

## 2009-05-13 LAB — COMPREHENSIVE METABOLIC PANEL
ALT: 18 U/L (ref 0–53)
AST: 24 U/L (ref 0–37)
Alkaline Phosphatase: 45 U/L (ref 39–117)
CO2: 25 mEq/L (ref 19–32)
Sodium: 137 mEq/L (ref 135–145)
Total Bilirubin: 0.6 mg/dL (ref 0.3–1.2)
Total Protein: 8.4 g/dL — ABNORMAL HIGH (ref 6.0–8.3)

## 2009-05-13 LAB — PROTEIN ELECTROPHORESIS, SERUM
Beta 2: 3.3 % (ref 3.2–6.5)
Beta Globulin: 4.5 % — ABNORMAL LOW (ref 4.7–7.2)
Gamma Globulin: 32.3 % — ABNORMAL HIGH (ref 11.1–18.8)
M-Spike, %: 2.58 g/dL
Total Protein, Serum Electrophoresis: 8.9 g/dL — ABNORMAL HIGH (ref 6.0–8.3)

## 2009-05-30 ENCOUNTER — Ambulatory Visit (HOSPITAL_COMMUNITY): Admission: RE | Admit: 2009-05-30 | Discharge: 2009-05-30 | Payer: Self-pay | Admitting: Oncology

## 2009-06-11 ENCOUNTER — Ambulatory Visit: Payer: Self-pay | Admitting: Oncology

## 2009-06-20 LAB — CBC WITH DIFFERENTIAL/PLATELET
BASO%: 1.1 % (ref 0.0–2.0)
EOS%: 0.1 % (ref 0.0–7.0)
HGB: 14 g/dL (ref 13.0–17.1)
MCH: 32.2 pg (ref 27.2–33.4)
MCHC: 34.5 g/dL (ref 32.0–36.0)
RDW: 14.8 % — ABNORMAL HIGH (ref 11.0–14.6)
WBC: 8.3 10*3/uL (ref 4.0–10.3)
lymph#: 1.2 10*3/uL (ref 0.9–3.3)

## 2009-06-24 LAB — COMPREHENSIVE METABOLIC PANEL
ALT: 31 U/L (ref 0–53)
AST: 30 U/L (ref 0–37)
Albumin: 3.5 g/dL (ref 3.5–5.2)
Calcium: 8.4 mg/dL (ref 8.4–10.5)
Chloride: 103 mEq/L (ref 96–112)
Potassium: 3.9 mEq/L (ref 3.5–5.3)
Total Protein: 7.1 g/dL (ref 6.0–8.3)

## 2009-06-24 LAB — PROTEIN ELECTROPHORESIS, SERUM
Gamma Globulin: 30.1 % — ABNORMAL HIGH (ref 11.1–18.8)
M-Spike, %: 1.94 g/dL

## 2009-07-16 ENCOUNTER — Ambulatory Visit: Payer: Self-pay | Admitting: Oncology

## 2009-07-18 ENCOUNTER — Encounter: Admission: RE | Admit: 2009-07-18 | Discharge: 2009-07-18 | Payer: Self-pay | Admitting: Neurosurgery

## 2009-07-18 LAB — CBC WITH DIFFERENTIAL/PLATELET
BASO%: 0 % (ref 0.0–2.0)
EOS%: 0 % (ref 0.0–7.0)
HCT: 38.6 % (ref 38.4–49.9)
HGB: 13.6 g/dL (ref 13.0–17.1)
LYMPH%: 13.8 % — ABNORMAL LOW (ref 14.0–49.0)
MCHC: 35.3 g/dL (ref 32.0–36.0)
MCV: 92.8 fL (ref 79.3–98.0)
MONO#: 0.3 10*3/uL (ref 0.1–0.9)
MONO%: 5.1 % (ref 0.0–14.0)
Platelets: 99 10*3/uL — ABNORMAL LOW (ref 140–400)
RBC: 4.16 10*6/uL — ABNORMAL LOW (ref 4.20–5.82)

## 2009-07-22 ENCOUNTER — Emergency Department (HOSPITAL_COMMUNITY): Admission: EM | Admit: 2009-07-22 | Discharge: 2009-07-22 | Payer: Self-pay | Admitting: Emergency Medicine

## 2009-07-23 ENCOUNTER — Telehealth: Payer: Self-pay | Admitting: Internal Medicine

## 2009-08-01 ENCOUNTER — Inpatient Hospital Stay (HOSPITAL_COMMUNITY): Admission: RE | Admit: 2009-08-01 | Discharge: 2009-08-02 | Payer: Self-pay | Admitting: Neurosurgery

## 2009-08-13 ENCOUNTER — Ambulatory Visit: Payer: Self-pay | Admitting: Physical Medicine & Rehabilitation

## 2009-08-14 ENCOUNTER — Ambulatory Visit: Payer: Self-pay | Admitting: Physical Medicine & Rehabilitation

## 2009-08-14 ENCOUNTER — Inpatient Hospital Stay (HOSPITAL_COMMUNITY)
Admission: RE | Admit: 2009-08-14 | Discharge: 2009-09-09 | Payer: Self-pay | Admitting: Physical Medicine & Rehabilitation

## 2009-08-15 ENCOUNTER — Ambulatory Visit: Payer: Self-pay | Admitting: Vascular Surgery

## 2009-08-15 ENCOUNTER — Encounter: Payer: Self-pay | Admitting: Physical Medicine & Rehabilitation

## 2009-08-20 ENCOUNTER — Ambulatory Visit: Payer: Self-pay | Admitting: Oncology

## 2009-09-10 ENCOUNTER — Inpatient Hospital Stay: Admission: AD | Admit: 2009-09-10 | Discharge: 2009-09-25 | Payer: Self-pay | Admitting: Internal Medicine

## 2009-09-13 ENCOUNTER — Ambulatory Visit (HOSPITAL_COMMUNITY): Admission: RE | Admit: 2009-09-13 | Discharge: 2009-09-13 | Payer: Self-pay | Admitting: Internal Medicine

## 2009-09-26 ENCOUNTER — Encounter: Admission: RE | Admit: 2009-09-26 | Discharge: 2009-09-26 | Payer: Self-pay | Admitting: Neurosurgery

## 2009-10-01 ENCOUNTER — Ambulatory Visit: Payer: Self-pay | Admitting: Oncology

## 2009-10-02 ENCOUNTER — Encounter
Admission: RE | Admit: 2009-10-02 | Discharge: 2009-12-05 | Payer: Self-pay | Admitting: Physical Medicine & Rehabilitation

## 2009-10-03 LAB — CBC WITH DIFFERENTIAL/PLATELET
BASO%: 0.2 % (ref 0.0–2.0)
EOS%: 0 % (ref 0.0–7.0)
LYMPH%: 15.6 % (ref 14.0–49.0)
MCHC: 34.8 g/dL (ref 32.0–36.0)
MCV: 82.9 fL (ref 79.3–98.0)
MONO%: 2.1 % (ref 0.0–14.0)
Platelets: ADEQUATE 10*3/uL (ref 140–400)
RBC: 3.98 10*6/uL — ABNORMAL LOW (ref 4.20–5.82)
RDW: 16.8 % — ABNORMAL HIGH (ref 11.0–14.6)

## 2009-10-07 ENCOUNTER — Ambulatory Visit: Payer: Self-pay | Admitting: Physical Medicine & Rehabilitation

## 2009-10-07 LAB — COMPREHENSIVE METABOLIC PANEL
ALT: 14 U/L (ref 0–53)
AST: 22 U/L (ref 0–37)
Alkaline Phosphatase: 46 U/L (ref 39–117)
Potassium: 4.8 mEq/L (ref 3.5–5.3)
Sodium: 134 mEq/L — ABNORMAL LOW (ref 135–145)
Total Bilirubin: 0.4 mg/dL (ref 0.3–1.2)
Total Protein: 7.9 g/dL (ref 6.0–8.3)

## 2009-10-07 LAB — PROTEIN ELECTROPHORESIS, SERUM
Albumin ELP: 50.5 % — ABNORMAL LOW (ref 55.8–66.1)
Beta 2: 4.1 % (ref 3.2–6.5)
Beta Globulin: 4.5 % — ABNORMAL LOW (ref 4.7–7.2)
Gamma Globulin: 26.9 % — ABNORMAL HIGH (ref 11.1–18.8)
M-Spike, %: 1.72 g/dL

## 2009-10-10 ENCOUNTER — Ambulatory Visit (HOSPITAL_COMMUNITY)
Admission: RE | Admit: 2009-10-10 | Discharge: 2009-10-10 | Payer: Self-pay | Admitting: Physical Medicine & Rehabilitation

## 2009-10-10 ENCOUNTER — Encounter: Payer: Self-pay | Admitting: Physical Medicine & Rehabilitation

## 2009-10-10 ENCOUNTER — Ambulatory Visit: Payer: Self-pay | Admitting: Vascular Surgery

## 2009-11-05 ENCOUNTER — Encounter (HOSPITAL_COMMUNITY): Admission: RE | Admit: 2009-11-05 | Discharge: 2009-12-05 | Payer: Self-pay | Admitting: Neurosurgery

## 2009-11-27 ENCOUNTER — Ambulatory Visit: Payer: Self-pay | Admitting: Oncology

## 2009-11-28 LAB — CBC WITH DIFFERENTIAL/PLATELET
BASO%: 0.2 % (ref 0.0–2.0)
EOS%: 2.3 % (ref 0.0–7.0)
HCT: 34.3 % — ABNORMAL LOW (ref 38.4–49.9)
LYMPH%: 42.1 % (ref 14.0–49.0)
MCH: 27.6 pg (ref 27.2–33.4)
MCHC: 35.6 g/dL (ref 32.0–36.0)
MONO#: 0.6 10*3/uL (ref 0.1–0.9)
NEUT%: 42.6 % (ref 39.0–75.0)
RBC: 4.42 10*6/uL (ref 4.20–5.82)
WBC: 4.4 10*3/uL (ref 4.0–10.3)
lymph#: 1.9 10*3/uL (ref 0.9–3.3)
nRBC: 0 % (ref 0–0)

## 2009-12-05 ENCOUNTER — Ambulatory Visit: Payer: Self-pay | Admitting: Physical Medicine & Rehabilitation

## 2009-12-10 ENCOUNTER — Encounter (HOSPITAL_COMMUNITY): Admission: RE | Admit: 2009-12-10 | Discharge: 2010-01-09 | Payer: Self-pay | Admitting: Neurosurgery

## 2009-12-10 LAB — HGB ELECTROPHORESIS REFLEXED REPORT
Hemoglobin A - HGBRFX: 59.1 % — ABNORMAL LOW (ref >96.0)
Hemoglobin Elect C: 37.4 % — ABNORMAL HIGH
Hemoglobin F - HGBRFX: 0.7 % (ref <2.0)
Sickle Solubility Test - HGBRFX: NEGATIVE

## 2009-12-10 LAB — HEMOGLOBINOPATHY EVALUATION
Hgb A2 Quant: 3 % (ref 2.2–3.2)
Hgb F Quant: 0.6 % (ref 0.0–2.0)

## 2009-12-13 ENCOUNTER — Emergency Department (HOSPITAL_COMMUNITY): Admission: EM | Admit: 2009-12-13 | Discharge: 2009-12-13 | Payer: Self-pay | Admitting: Emergency Medicine

## 2009-12-15 ENCOUNTER — Encounter (HOSPITAL_COMMUNITY)
Admission: RE | Admit: 2009-12-15 | Discharge: 2010-01-14 | Payer: Self-pay | Admitting: Physical Medicine & Rehabilitation

## 2009-12-26 LAB — CBC WITH DIFFERENTIAL/PLATELET
Basophils Absolute: 0 10*3/uL (ref 0.0–0.1)
Eosinophils Absolute: 0.1 10*3/uL (ref 0.0–0.5)
HCT: 35.2 % — ABNORMAL LOW (ref 38.4–49.9)
LYMPH%: 39.7 % (ref 14.0–49.0)
MCV: 76.5 fL — ABNORMAL LOW (ref 79.3–98.0)
MONO#: 0.6 10*3/uL (ref 0.1–0.9)
MONO%: 12.5 % (ref 0.0–14.0)
NEUT#: 2.2 10*3/uL (ref 1.5–6.5)
NEUT%: 45.4 % (ref 39.0–75.0)
Platelets: 217 10*3/uL (ref 140–400)
RBC: 4.6 10*6/uL (ref 4.20–5.82)

## 2009-12-30 LAB — BASIC METABOLIC PANEL
BUN: 19 mg/dL (ref 6–23)
CO2: 25 mEq/L (ref 19–32)
Calcium: 9.6 mg/dL (ref 8.4–10.5)
Creatinine, Ser: 1.04 mg/dL (ref 0.40–1.50)
Glucose, Bld: 116 mg/dL — ABNORMAL HIGH (ref 70–99)
Sodium: 140 mEq/L (ref 135–145)

## 2009-12-30 LAB — PROTEIN ELECTROPHORESIS, SERUM
Gamma Globulin: 28.3 % — ABNORMAL HIGH (ref 11.1–18.8)
M-Spike, %: 2 g/dL
Total Protein, Serum Electrophoresis: 8.2 g/dL (ref 6.0–8.3)

## 2009-12-30 LAB — FERRITIN: Ferritin: 381 ng/mL — ABNORMAL HIGH (ref 22–322)

## 2010-01-13 ENCOUNTER — Encounter (HOSPITAL_COMMUNITY): Admission: RE | Admit: 2010-01-13 | Discharge: 2010-02-12 | Payer: Self-pay | Admitting: Neurosurgery

## 2010-01-16 ENCOUNTER — Encounter (HOSPITAL_COMMUNITY)
Admission: RE | Admit: 2010-01-16 | Discharge: 2010-02-15 | Payer: Self-pay | Admitting: Physical Medicine & Rehabilitation

## 2010-01-21 ENCOUNTER — Ambulatory Visit: Payer: Self-pay | Admitting: Oncology

## 2010-01-23 LAB — CBC WITH DIFFERENTIAL/PLATELET
BASO%: 0.4 % (ref 0.0–2.0)
EOS%: 2.4 % (ref 0.0–7.0)
HCT: 38.1 % — ABNORMAL LOW (ref 38.4–49.9)
HGB: 13.4 g/dL (ref 13.0–17.1)
LYMPH%: 41.8 % (ref 14.0–49.0)
MCV: 77.3 fL — ABNORMAL LOW (ref 79.3–98.0)
MONO#: 0.7 10*3/uL (ref 0.1–0.9)
MONO%: 13.8 % (ref 0.0–14.0)
NEUT#: 2.1 10*3/uL (ref 1.5–6.5)
NEUT%: 41.6 % (ref 39.0–75.0)
Platelets: 204 10*3/uL (ref 140–400)
lymph#: 2.1 10*3/uL (ref 0.9–3.3)
nRBC: 0 % (ref 0–0)

## 2010-02-04 ENCOUNTER — Encounter
Admission: RE | Admit: 2010-02-04 | Discharge: 2010-05-05 | Payer: Self-pay | Admitting: Physical Medicine & Rehabilitation

## 2010-02-06 ENCOUNTER — Ambulatory Visit: Payer: Self-pay | Admitting: Physical Medicine & Rehabilitation

## 2010-02-13 ENCOUNTER — Encounter (HOSPITAL_COMMUNITY): Admission: RE | Admit: 2010-02-13 | Discharge: 2010-03-15 | Payer: Self-pay | Admitting: Neurosurgery

## 2010-03-04 ENCOUNTER — Ambulatory Visit: Payer: Self-pay | Admitting: Oncology

## 2010-03-06 LAB — CBC WITH DIFFERENTIAL/PLATELET
Basophils Absolute: 0 10*3/uL (ref 0.0–0.1)
EOS%: 2 % (ref 0.0–7.0)
Eosinophils Absolute: 0.1 10*3/uL (ref 0.0–0.5)
LYMPH%: 53.3 % — ABNORMAL HIGH (ref 14.0–49.0)
MCH: 27.7 pg (ref 27.2–33.4)
MCHC: 35.6 g/dL (ref 32.0–36.0)
MONO#: 0.4 10*3/uL (ref 0.1–0.9)
lymph#: 2.1 10*3/uL (ref 0.9–3.3)

## 2010-03-10 LAB — PROTEIN ELECTROPHORESIS, SERUM
Albumin ELP: 50.8 % — ABNORMAL LOW (ref 55.8–66.1)
Total Protein, Serum Electrophoresis: 7.8 g/dL (ref 6.0–8.3)

## 2010-03-10 LAB — BASIC METABOLIC PANEL
BUN: 19 mg/dL (ref 6–23)
CO2: 23 mEq/L (ref 19–32)
Calcium: 9 mg/dL (ref 8.4–10.5)
Glucose, Bld: 89 mg/dL (ref 70–99)
Potassium: 3.9 mEq/L (ref 3.5–5.3)

## 2010-03-18 ENCOUNTER — Encounter (HOSPITAL_COMMUNITY): Admission: RE | Admit: 2010-03-18 | Discharge: 2010-04-17 | Payer: Self-pay | Admitting: Neurosurgery

## 2010-04-03 ENCOUNTER — Ambulatory Visit: Payer: Self-pay | Admitting: Physical Medicine & Rehabilitation

## 2010-04-22 ENCOUNTER — Ambulatory Visit: Payer: Self-pay | Admitting: Oncology

## 2010-04-24 LAB — CBC WITH DIFFERENTIAL/PLATELET
BASO%: 0.5 % (ref 0.0–2.0)
HGB: 13 g/dL (ref 13.0–17.1)
LYMPH%: 50.6 % — ABNORMAL HIGH (ref 14.0–49.0)
MCH: 28.3 pg (ref 27.2–33.4)
MONO#: 0.3 10*3/uL (ref 0.1–0.9)
NEUT%: 39 % (ref 39.0–75.0)
RDW: 16.9 % — ABNORMAL HIGH (ref 11.0–14.6)
WBC: 4.3 10*3/uL (ref 4.0–10.3)
lymph#: 2.2 10*3/uL (ref 0.9–3.3)
nRBC: 0 % (ref 0–0)

## 2010-04-28 LAB — COMPREHENSIVE METABOLIC PANEL
AST: 33 U/L (ref 0–37)
Alkaline Phosphatase: 52 U/L (ref 39–117)
BUN: 17 mg/dL (ref 6–23)
CO2: 23 mEq/L (ref 19–32)
Calcium: 9 mg/dL (ref 8.4–10.5)
Chloride: 104 mEq/L (ref 96–112)
Glucose, Bld: 89 mg/dL (ref 70–99)
Sodium: 136 mEq/L (ref 135–145)

## 2010-04-28 LAB — PROTEIN ELECTROPHORESIS, SERUM
Albumin ELP: 49.9 % — ABNORMAL LOW (ref 55.8–66.1)
Beta Globulin: 4 % — ABNORMAL LOW (ref 4.7–7.2)
Gamma Globulin: 32.6 % — ABNORMAL HIGH (ref 11.1–18.8)
M-Spike, %: 2.29 g/dL
Total Protein, Serum Electrophoresis: 7.9 g/dL (ref 6.0–8.3)

## 2010-06-18 ENCOUNTER — Inpatient Hospital Stay (HOSPITAL_COMMUNITY): Admission: EM | Admit: 2010-06-18 | Discharge: 2009-08-14 | Payer: Self-pay | Admitting: Emergency Medicine

## 2010-06-24 ENCOUNTER — Ambulatory Visit: Payer: Self-pay | Admitting: Oncology

## 2010-06-26 LAB — CBC WITH DIFFERENTIAL/PLATELET
Basophils Absolute: 0 10*3/uL (ref 0.0–0.1)
EOS%: 2 % (ref 0.0–7.0)
HGB: 13 g/dL (ref 13.0–17.1)
MCH: 30.1 pg (ref 27.2–33.4)
MCHC: 34.9 g/dL (ref 32.0–36.0)
MCV: 86.1 fL (ref 79.3–98.0)
MONO%: 10 % (ref 0.0–14.0)
NEUT%: 40.9 % (ref 39.0–75.0)
RDW: 16.4 % — ABNORMAL HIGH (ref 11.0–14.6)

## 2010-06-30 LAB — COMPREHENSIVE METABOLIC PANEL
ALT: 18 U/L (ref 0–53)
AST: 26 U/L (ref 0–37)
Albumin: 3.6 g/dL (ref 3.5–5.2)
Alkaline Phosphatase: 46 U/L (ref 39–117)
Glucose, Bld: 84 mg/dL (ref 70–99)
Potassium: 4.2 mEq/L (ref 3.5–5.3)
Sodium: 137 mEq/L (ref 135–145)
Total Bilirubin: 0.5 mg/dL (ref 0.3–1.2)
Total Protein: 8.1 g/dL (ref 6.0–8.3)

## 2010-06-30 LAB — PROTEIN ELECTROPHORESIS, SERUM
Albumin ELP: 48.8 % — ABNORMAL LOW (ref 55.8–66.1)
Beta 2: 3.1 % — ABNORMAL LOW (ref 3.2–6.5)
Beta Globulin: 4.1 % — ABNORMAL LOW (ref 4.7–7.2)
Gamma Globulin: 33.8 % — ABNORMAL HIGH (ref 11.1–18.8)

## 2010-07-16 ENCOUNTER — Emergency Department (HOSPITAL_COMMUNITY)
Admission: EM | Admit: 2010-07-16 | Discharge: 2010-07-16 | Payer: Self-pay | Source: Home / Self Care | Admitting: Emergency Medicine

## 2010-07-16 LAB — DIFFERENTIAL
Basophils Absolute: 0 10*3/uL (ref 0.0–0.1)
Basophils Relative: 0 % (ref 0–1)
Eosinophils Absolute: 0.1 10*3/uL (ref 0.0–0.7)
Eosinophils Relative: 1 % (ref 0–5)
Lymphocytes Relative: 25 % (ref 12–46)
Lymphs Abs: 2.1 10*3/uL (ref 0.7–4.0)
Monocytes Absolute: 0.9 10*3/uL (ref 0.1–1.0)
Monocytes Relative: 11 % (ref 3–12)
Neutro Abs: 5.2 10*3/uL (ref 1.7–7.7)
Neutrophils Relative %: 63 % (ref 43–77)

## 2010-07-16 LAB — POCT I-STAT, CHEM 8
BUN: 13 mg/dL (ref 6–23)
Calcium, Ion: 1.14 mmol/L (ref 1.12–1.32)
Chloride: 107 mEq/L (ref 96–112)
Creatinine, Ser: 0.9 mg/dL (ref 0.4–1.5)
Glucose, Bld: 94 mg/dL (ref 70–99)
HCT: 39 % (ref 39.0–52.0)
Hemoglobin: 13.3 g/dL (ref 13.0–17.0)
Potassium: 3.8 mEq/L (ref 3.5–5.1)
Sodium: 140 mEq/L (ref 135–145)
TCO2: 27 mmol/L (ref 0–100)

## 2010-07-16 LAB — PROTIME-INR
INR: 1.07 (ref 0.00–1.49)
Prothrombin Time: 14.1 seconds (ref 11.6–15.2)

## 2010-07-16 LAB — CBC
HCT: 34.1 % — ABNORMAL LOW (ref 39.0–52.0)
Hemoglobin: 12.2 g/dL — ABNORMAL LOW (ref 13.0–17.0)
MCH: 29.7 pg (ref 26.0–34.0)
MCHC: 35.8 g/dL (ref 30.0–36.0)
MCV: 83 fL (ref 78.0–100.0)
Platelets: 185 10*3/uL (ref 150–400)
RBC: 4.11 MIL/uL — ABNORMAL LOW (ref 4.22–5.81)
RDW: 15.7 % — ABNORMAL HIGH (ref 11.5–15.5)
WBC: 8.3 10*3/uL (ref 4.0–10.5)

## 2010-07-16 LAB — APTT: aPTT: 29 seconds (ref 24–37)

## 2010-07-24 ENCOUNTER — Encounter
Admission: RE | Admit: 2010-07-24 | Discharge: 2010-07-24 | Payer: Self-pay | Source: Home / Self Care | Attending: Neurosurgery | Admitting: Neurosurgery

## 2010-08-02 ENCOUNTER — Encounter: Payer: Self-pay | Admitting: Neurosurgery

## 2010-08-12 ENCOUNTER — Encounter: Payer: Self-pay | Admitting: Family Medicine

## 2010-08-13 NOTE — Procedures (Signed)
Summary: Upper Endoscopy/[facility]  Upper Endoscopy/[facility]   Imported By: Sherian Rein 08/22/2009 12:21:39  _____________________________________________________________________  External Attachment:    Type:   Image     Comment:   External Document

## 2010-08-13 NOTE — Progress Notes (Signed)
Summary: triage  Phone Note Call from Patient Call back at [phone]   Caller: wife, Angelique Blonder Call For: Dr. Leone Payor Reason for Call: Talk to Nurse Summary of Call: pt was recently seen in ER for abd pain... pr diagnosed with internal bleeding per wife and were advised to sch f/u appt with Dr. Leone Payor asap Initial call taken by: Vallarie Mare,  July 23, 2009 11:08 AM  Follow-up for Phone Call        Patient  had black stools and he was found to be heme positive , she is offered an appointment with Dr Leone Payor for next week, patient's wife declines she can only bring him on a Friday.  patient is scheduled for 08-22-09 Follow-up by: Darcey Nora RN, CGRN,  July 23, 2009 12:02 PM     Appended Document: triage Called pt @ [phone] and offered him an appt today at 1:00PM.  He wanted to have his colonoscopy today. I told him that is not possible but he could see our PA-C at 1:00 Pm regarding his rectal bleeding. He said he doesn't need to come in. He went to [facility] on Tuesday 07-22-09 and hasn't had any bleeding since.  I again urged him that he may still want to see the PA to be checked out but he refused.  He thanked me for offering.  He said he has an appt in Feb with Dr. Leone Payor but he may cancel it.He thinks it is for a Colonoscopy but it is an office visit. He is having neck surgery next week and wants to wait until after that to have his colonoscopy.  Appended Document: triage Per Mike Gip PA-C , I called pt again at [phone] and LM for him to call me.  We heard from DR. Mariel Sleet office, the pt's oncologist.  He thinks the pt should have the colonoscopy before his neck surgery. I LM for the pt to call me.  I advised him on the mesage we have an opening next week for the Colonoscopy, Wed the 07-30-09.   Appended Document: triage Also LM for pt's wife at [phone] and asked her to call us or have  her husband call us back today please.  Appended Document: triage Left another  message at 4:25 PM for pt. Asked him to call me about scheduling Colonoscopy before his neck surgery which is what his oncologist suggests Dr. Truett Perna.

## 2010-09-25 ENCOUNTER — Other Ambulatory Visit: Payer: Self-pay | Admitting: Oncology

## 2010-09-25 ENCOUNTER — Ambulatory Visit: Payer: Self-pay | Admitting: Physical Medicine & Rehabilitation

## 2010-09-25 ENCOUNTER — Encounter (HOSPITAL_BASED_OUTPATIENT_CLINIC_OR_DEPARTMENT_OTHER): Payer: Medicare Other | Admitting: Oncology

## 2010-09-25 DIAGNOSIS — I82819 Embolism and thrombosis of superficial veins of unspecified lower extremities: Secondary | ICD-10-CM

## 2010-09-25 DIAGNOSIS — D630 Anemia in neoplastic disease: Secondary | ICD-10-CM

## 2010-09-25 DIAGNOSIS — Z7901 Long term (current) use of anticoagulants: Secondary | ICD-10-CM

## 2010-09-25 DIAGNOSIS — C9000 Multiple myeloma not having achieved remission: Secondary | ICD-10-CM

## 2010-09-25 LAB — CBC WITH DIFFERENTIAL/PLATELET
BASO%: 0.4 % (ref 0.0–2.0)
Basophils Absolute: 0 10*3/uL (ref 0.0–0.1)
EOS%: 2.4 % (ref 0.0–7.0)
HGB: 12.1 g/dL — ABNORMAL LOW (ref 13.0–17.1)
MCH: 28.3 pg (ref 27.2–33.4)
MCHC: 36 g/dL (ref 32.0–36.0)
MCV: 78.5 fL — ABNORMAL LOW (ref 79.3–98.0)
MONO%: 6.6 % (ref 0.0–14.0)
NEUT%: 53.9 % (ref 39.0–75.0)
RDW: 16.6 % — ABNORMAL HIGH (ref 11.0–14.6)

## 2010-09-27 LAB — HEMOCCULT GUIAC POC 1CARD (OFFICE): Fecal Occult Bld: POSITIVE

## 2010-09-27 LAB — BASIC METABOLIC PANEL
BUN: 14 mg/dL (ref 6–23)
BUN: 16 mg/dL (ref 6–23)
BUN: 18 mg/dL (ref 6–23)
CO2: 22 mEq/L (ref 19–32)
CO2: 24 mEq/L (ref 19–32)
CO2: 24 mEq/L (ref 19–32)
CO2: 27 mEq/L (ref 19–32)
Calcium: 7.9 mg/dL — ABNORMAL LOW (ref 8.4–10.5)
Calcium: 9.2 mg/dL (ref 8.4–10.5)
Chloride: 106 mEq/L (ref 96–112)
Chloride: 106 mEq/L (ref 96–112)
Creatinine, Ser: 0.71 mg/dL (ref 0.4–1.5)
Creatinine, Ser: 0.81 mg/dL (ref 0.4–1.5)
GFR calc Af Amer: >60 mL/min (ref >60)
Glucose, Bld: 112 mg/dL — ABNORMAL HIGH (ref 70–99)
Glucose, Bld: 176 mg/dL — ABNORMAL HIGH (ref 70–99)
Potassium: 3.9 mEq/L (ref 3.5–5.1)
Sodium: 133 mEq/L — ABNORMAL LOW (ref 135–145)
Sodium: 138 mEq/L (ref 135–145)

## 2010-09-27 LAB — CBC
HCT: 31.9 % — ABNORMAL LOW (ref 39.0–52.0)
HCT: 41.1 % (ref 39.0–52.0)
Hemoglobin: 11.1 g/dL — ABNORMAL LOW (ref 13.0–17.0)
Hemoglobin: 14.3 g/dL (ref 13.0–17.0)
MCHC: 34 g/dL (ref 30.0–36.0)
MCHC: 34.7 g/dL (ref 30.0–36.0)
MCHC: 34.8 g/dL (ref 30.0–36.0)
MCHC: 34.9 g/dL (ref 30.0–36.0)
MCV: 93.5 fL (ref 78.0–100.0)
MCV: 94.1 fL (ref 78.0–100.0)
MCV: 94.4 fL (ref 78.0–100.0)
Platelets: 119 10*3/uL — ABNORMAL LOW (ref 150–400)
Platelets: 122 10*3/uL — ABNORMAL LOW (ref 150–400)
Platelets: 94 10*3/uL — ABNORMAL LOW (ref 150–400)
RDW: 14.7 % (ref 11.5–15.5)
RDW: 15 % (ref 11.5–15.5)

## 2010-09-27 LAB — DIFFERENTIAL
Basophils Absolute: 0 10*3/uL (ref 0.0–0.1)
Basophils Absolute: 0 10*3/uL (ref 0.0–0.1)
Basophils Relative: 0 % (ref 0–1)
Basophils Relative: 1 % (ref 0–1)
Eosinophils Absolute: 0 10*3/uL (ref 0.0–0.7)
Eosinophils Absolute: 0 10*3/uL (ref 0.0–0.7)
Eosinophils Absolute: 0 10*3/uL (ref 0.0–0.7)
Eosinophils Relative: 0 % (ref 0–5)
Eosinophils Relative: 1 % (ref 0–5)
Lymphocytes Relative: 27 % (ref 12–46)
Lymphocytes Relative: 17 % (ref 12–46)
Lymphs Abs: 1.2 10*3/uL (ref 0.7–4.0)
Monocytes Absolute: 0.2 10*3/uL (ref 0.1–1.0)
Monocytes Relative: 9 % (ref 3–12)
Neutro Abs: 6.1 10*3/uL (ref 1.7–7.7)
Neutrophils Relative %: 64 % (ref 43–77)

## 2010-09-27 LAB — POCT I-STAT, CHEM 8
Creatinine, Ser: 1.4 mg/dL (ref 0.4–1.5)
Glucose, Bld: 87 mg/dL (ref 70–99)
Hemoglobin: 9.2 g/dL — ABNORMAL LOW (ref 13.0–17.0)
Sodium: 138 mEq/L (ref 135–145)
TCO2: 23 mmol/L (ref 0–100)

## 2010-09-27 LAB — URINALYSIS, ROUTINE W REFLEX MICROSCOPIC
Hgb urine dipstick: NEGATIVE
Protein, ur: NEGATIVE mg/dL
Urobilinogen, UA: 1 mg/dL (ref 0.0–1.0)

## 2010-09-27 LAB — CULTURE, BLOOD (ROUTINE X 2): Culture: NO GROWTH

## 2010-09-27 LAB — CK TOTAL AND CKMB (NOT AT ARMC)
CK, MB: 2.1 ng/mL (ref 0.3–4.0)
Relative Index: INVALID (ref 0.0–2.5)
Total CK: 118 U/L (ref 7–232)
Total CK: 96 U/L (ref 7–232)

## 2010-09-27 LAB — COMPREHENSIVE METABOLIC PANEL
AST: 30 U/L (ref 0–37)
Albumin: 2.9 g/dL — ABNORMAL LOW (ref 3.5–5.2)
Calcium: 8.8 mg/dL (ref 8.4–10.5)
Creatinine, Ser: 1.13 mg/dL (ref 0.4–1.5)
GFR calc Af Amer: >60 mL/min (ref >60)
Sodium: 129 mEq/L — ABNORMAL LOW (ref 135–145)
Total Protein: 6.3 g/dL (ref 6.0–8.3)

## 2010-09-27 LAB — TROPONIN I: Troponin I: 0.03 ng/mL (ref 0.00–0.06)

## 2010-09-27 LAB — PROTIME-INR: Prothrombin Time: 13.3 seconds (ref 11.6–15.2)

## 2010-09-28 LAB — URINALYSIS, ROUTINE W REFLEX MICROSCOPIC
Protein, ur: >300 mg/dL — AB
Urobilinogen, UA: 1 mg/dL (ref 0.0–1.0)

## 2010-09-28 LAB — URINE CULTURE

## 2010-09-28 LAB — POCT I-STAT, CHEM 8
BUN: 17 mg/dL (ref 6–23)
Creatinine, Ser: 0.8 mg/dL (ref 0.4–1.5)
Potassium: 3.5 mEq/L (ref 3.5–5.1)
Sodium: 139 mEq/L (ref 135–145)

## 2010-09-28 LAB — URINE MICROSCOPIC-ADD ON

## 2010-09-29 LAB — PROTEIN ELECTROPHORESIS, SERUM
Albumin ELP: 44.4 % — ABNORMAL LOW (ref 55.8–66.1)
Alpha-1-Globulin: 3.5 % (ref 2.9–4.9)

## 2010-09-29 LAB — COMPREHENSIVE METABOLIC PANEL
Albumin: 3.5 g/dL (ref 3.5–5.2)
BUN: 16 mg/dL (ref 6–23)
Calcium: 8.8 mg/dL (ref 8.4–10.5)
Chloride: 104 mEq/L (ref 96–112)
Glucose, Bld: 119 mg/dL — ABNORMAL HIGH (ref 70–99)
Potassium: 3.9 mEq/L (ref 3.5–5.3)

## 2010-09-30 LAB — URINALYSIS, MICROSCOPIC ONLY
Bilirubin Urine: NEGATIVE
Glucose, UA: NEGATIVE mg/dL
Ketones, ur: NEGATIVE mg/dL
Nitrite: NEGATIVE
Protein, ur: NEGATIVE mg/dL
Specific Gravity, Urine: 1.015 (ref 1.005–1.030)
Specific Gravity, Urine: 1.016 (ref 1.005–1.030)
Urobilinogen, UA: 1 mg/dL (ref 0.0–1.0)
pH: 8.5 — ABNORMAL HIGH (ref 5.0–8.0)

## 2010-09-30 LAB — BASIC METABOLIC PANEL
BUN: 13 mg/dL (ref 6–23)
BUN: 15 mg/dL (ref 6–23)
BUN: 7 mg/dL (ref 6–23)
BUN: 9 mg/dL (ref 6–23)
CO2: 27 mEq/L (ref 19–32)
Calcium: 8.4 mg/dL (ref 8.4–10.5)
Calcium: 8.8 mg/dL (ref 8.4–10.5)
Calcium: 8.8 mg/dL (ref 8.4–10.5)
Chloride: 100 mEq/L (ref 96–112)
Chloride: 101 mEq/L (ref 96–112)
Chloride: 101 mEq/L (ref 96–112)
Chloride: 91 mEq/L — ABNORMAL LOW (ref 96–112)
Creatinine, Ser: 0.98 mg/dL (ref 0.4–1.5)
GFR calc Af Amer: >60 mL/min (ref >60)
GFR calc Af Amer: >60 mL/min (ref >60)
GFR calc Af Amer: >60 mL/min (ref >60)
GFR calc Af Amer: >60 mL/min (ref >60)
GFR calc non Af Amer: >60 mL/min (ref >60)
GFR calc non Af Amer: >60 mL/min (ref >60)
GFR calc non Af Amer: >60 mL/min (ref >60)
GFR calc non Af Amer: >60 mL/min (ref >60)
GFR calc non Af Amer: >60 mL/min (ref >60)
GFR calc non Af Amer: >60 mL/min (ref >60)
Glucose, Bld: 121 mg/dL — ABNORMAL HIGH (ref 70–99)
Glucose, Bld: 140 mg/dL — ABNORMAL HIGH (ref 70–99)
Glucose, Bld: 166 mg/dL — ABNORMAL HIGH (ref 70–99)
Glucose, Bld: 77 mg/dL (ref 70–99)
Glucose, Bld: 87 mg/dL (ref 70–99)
Potassium: 3.4 mEq/L — ABNORMAL LOW (ref 3.5–5.1)
Potassium: 3.5 mEq/L (ref 3.5–5.1)
Potassium: 4 mEq/L (ref 3.5–5.1)
Potassium: 4.1 mEq/L (ref 3.5–5.1)
Potassium: 4.1 mEq/L (ref 3.5–5.1)
Sodium: 125 mEq/L — ABNORMAL LOW (ref 135–145)
Sodium: 125 mEq/L — ABNORMAL LOW (ref 135–145)
Sodium: 129 mEq/L — ABNORMAL LOW (ref 135–145)
Sodium: 131 mEq/L — ABNORMAL LOW (ref 135–145)
Sodium: 132 mEq/L — ABNORMAL LOW (ref 135–145)
Sodium: 134 mEq/L — ABNORMAL LOW (ref 135–145)

## 2010-09-30 LAB — CBC
HCT: 24.6 % — ABNORMAL LOW (ref 39.0–52.0)
HCT: 25 % — ABNORMAL LOW (ref 39.0–52.0)
HCT: 25.8 % — ABNORMAL LOW (ref 39.0–52.0)
HCT: 26.8 % — ABNORMAL LOW (ref 39.0–52.0)
HCT: 27.1 % — ABNORMAL LOW (ref 39.0–52.0)
HCT: 29.7 % — ABNORMAL LOW (ref 39.0–52.0)
HCT: 30.4 % — ABNORMAL LOW (ref 39.0–52.0)
HCT: 30.5 % — ABNORMAL LOW (ref 39.0–52.0)
HCT: 30.6 % — ABNORMAL LOW (ref 39.0–52.0)
HCT: 31.1 % — ABNORMAL LOW (ref 39.0–52.0)
HCT: 31.3 % — ABNORMAL LOW (ref 39.0–52.0)
HCT: 31.6 % — ABNORMAL LOW (ref 39.0–52.0)
HCT: 32.5 % — ABNORMAL LOW (ref 39.0–52.0)
HCT: 33 % — ABNORMAL LOW (ref 39.0–52.0)
HCT: 33.8 % — ABNORMAL LOW (ref 39.0–52.0)
HCT: 34.1 % — ABNORMAL LOW (ref 39.0–52.0)
Hemoglobin: 10.4 g/dL — ABNORMAL LOW (ref 13.0–17.0)
Hemoglobin: 10.8 g/dL — ABNORMAL LOW (ref 13.0–17.0)
Hemoglobin: 11.2 g/dL — ABNORMAL LOW (ref 13.0–17.0)
Hemoglobin: 11.5 g/dL — ABNORMAL LOW (ref 13.0–17.0)
Hemoglobin: 11.9 g/dL — ABNORMAL LOW (ref 13.0–17.0)
Hemoglobin: 8.8 g/dL — ABNORMAL LOW (ref 13.0–17.0)
Hemoglobin: 9.4 g/dL — ABNORMAL LOW (ref 13.0–17.0)
MCHC: 34.3 g/dL (ref 30.0–36.0)
MCHC: 34.5 g/dL (ref 30.0–36.0)
MCHC: 34.8 g/dL (ref 30.0–36.0)
MCHC: 34.9 g/dL (ref 30.0–36.0)
MCHC: 34.9 g/dL (ref 30.0–36.0)
MCHC: 35 g/dL (ref 30.0–36.0)
MCHC: 35.1 g/dL (ref 30.0–36.0)
MCHC: 35.2 g/dL (ref 30.0–36.0)
MCHC: 35.2 g/dL (ref 30.0–36.0)
MCHC: 35.4 g/dL (ref 30.0–36.0)
MCHC: 35.5 g/dL (ref 30.0–36.0)
MCV: 89.4 fL (ref 78.0–100.0)
MCV: 89.6 fL (ref 78.0–100.0)
MCV: 89.8 fL (ref 78.0–100.0)
MCV: 89.9 fL (ref 78.0–100.0)
MCV: 90.1 fL (ref 78.0–100.0)
MCV: 90.6 fL (ref 78.0–100.0)
MCV: 91.9 fL (ref 78.0–100.0)
MCV: 92.1 fL (ref 78.0–100.0)
MCV: 92.3 fL (ref 78.0–100.0)
MCV: 93.1 fL (ref 78.0–100.0)
Platelets: 127 10*3/uL — ABNORMAL LOW (ref 150–400)
Platelets: 130 10*3/uL — ABNORMAL LOW (ref 150–400)
Platelets: 135 10*3/uL — ABNORMAL LOW (ref 150–400)
Platelets: 135 10*3/uL — ABNORMAL LOW (ref 150–400)
Platelets: 160 10*3/uL (ref 150–400)
Platelets: 160 10*3/uL (ref 150–400)
Platelets: 163 10*3/uL (ref 150–400)
Platelets: 172 10*3/uL (ref 150–400)
Platelets: 188 10*3/uL (ref 150–400)
Platelets: 196 10*3/uL (ref 150–400)
Platelets: 200 10*3/uL (ref 150–400)
Platelets: 202 10*3/uL (ref 150–400)
Platelets: 232 10*3/uL (ref 150–400)
Platelets: 292 10*3/uL (ref 150–400)
Platelets: 323 10*3/uL (ref 150–400)
Platelets: 352 10*3/uL (ref 150–400)
Platelets: 357 10*3/uL (ref 150–400)
RBC: 2.76 MIL/uL — ABNORMAL LOW (ref 4.22–5.81)
RBC: 2.95 MIL/uL — ABNORMAL LOW (ref 4.22–5.81)
RBC: 2.95 MIL/uL — ABNORMAL LOW (ref 4.22–5.81)
RBC: 3.01 MIL/uL — ABNORMAL LOW (ref 4.22–5.81)
RBC: 3.03 MIL/uL — ABNORMAL LOW (ref 4.22–5.81)
RBC: 3.09 MIL/uL — ABNORMAL LOW (ref 4.22–5.81)
RBC: 3.23 MIL/uL — ABNORMAL LOW (ref 4.22–5.81)
RBC: 3.23 MIL/uL — ABNORMAL LOW (ref 4.22–5.81)
RBC: 3.52 MIL/uL — ABNORMAL LOW (ref 4.22–5.81)
RDW: 15.1 % (ref 11.5–15.5)
RDW: 15.5 % (ref 11.5–15.5)
RDW: 15.6 % — ABNORMAL HIGH (ref 11.5–15.5)
RDW: 15.7 % — ABNORMAL HIGH (ref 11.5–15.5)
RDW: 15.7 % — ABNORMAL HIGH (ref 11.5–15.5)
RDW: 15.7 % — ABNORMAL HIGH (ref 11.5–15.5)
RDW: 16.4 % — ABNORMAL HIGH (ref 11.5–15.5)
RDW: 16.7 % — ABNORMAL HIGH (ref 11.5–15.5)
RDW: 17.2 % — ABNORMAL HIGH (ref 11.5–15.5)
WBC: 3 10*3/uL — ABNORMAL LOW (ref 4.0–10.5)
WBC: 3.3 10*3/uL — ABNORMAL LOW (ref 4.0–10.5)
WBC: 3.4 10*3/uL — ABNORMAL LOW (ref 4.0–10.5)
WBC: 3.9 10*3/uL — ABNORMAL LOW (ref 4.0–10.5)
WBC: 4 10*3/uL (ref 4.0–10.5)
WBC: 4.3 10*3/uL (ref 4.0–10.5)
WBC: 5 10*3/uL (ref 4.0–10.5)
WBC: 5.1 10*3/uL (ref 4.0–10.5)
WBC: 5.6 10*3/uL (ref 4.0–10.5)
WBC: 5.6 10*3/uL (ref 4.0–10.5)
WBC: 6.2 10*3/uL (ref 4.0–10.5)
WBC: 6.4 10*3/uL (ref 4.0–10.5)

## 2010-09-30 LAB — GLUCOSE, CAPILLARY
Glucose-Capillary: 100 mg/dL — ABNORMAL HIGH (ref 70–99)
Glucose-Capillary: 100 mg/dL — ABNORMAL HIGH (ref 70–99)
Glucose-Capillary: 100 mg/dL — ABNORMAL HIGH (ref 70–99)
Glucose-Capillary: 100 mg/dL — ABNORMAL HIGH (ref 70–99)
Glucose-Capillary: 101 mg/dL — ABNORMAL HIGH (ref 70–99)
Glucose-Capillary: 101 mg/dL — ABNORMAL HIGH (ref 70–99)
Glucose-Capillary: 101 mg/dL — ABNORMAL HIGH (ref 70–99)
Glucose-Capillary: 105 mg/dL — ABNORMAL HIGH (ref 70–99)
Glucose-Capillary: 107 mg/dL — ABNORMAL HIGH (ref 70–99)
Glucose-Capillary: 108 mg/dL — ABNORMAL HIGH (ref 70–99)
Glucose-Capillary: 109 mg/dL — ABNORMAL HIGH (ref 70–99)
Glucose-Capillary: 109 mg/dL — ABNORMAL HIGH (ref 70–99)
Glucose-Capillary: 110 mg/dL — ABNORMAL HIGH (ref 70–99)
Glucose-Capillary: 110 mg/dL — ABNORMAL HIGH (ref 70–99)
Glucose-Capillary: 112 mg/dL — ABNORMAL HIGH (ref 70–99)
Glucose-Capillary: 112 mg/dL — ABNORMAL HIGH (ref 70–99)
Glucose-Capillary: 113 mg/dL — ABNORMAL HIGH (ref 70–99)
Glucose-Capillary: 113 mg/dL — ABNORMAL HIGH (ref 70–99)
Glucose-Capillary: 115 mg/dL — ABNORMAL HIGH (ref 70–99)
Glucose-Capillary: 116 mg/dL — ABNORMAL HIGH (ref 70–99)
Glucose-Capillary: 117 mg/dL — ABNORMAL HIGH (ref 70–99)
Glucose-Capillary: 117 mg/dL — ABNORMAL HIGH (ref 70–99)
Glucose-Capillary: 117 mg/dL — ABNORMAL HIGH (ref 70–99)
Glucose-Capillary: 118 mg/dL — ABNORMAL HIGH (ref 70–99)
Glucose-Capillary: 121 mg/dL — ABNORMAL HIGH (ref 70–99)
Glucose-Capillary: 121 mg/dL — ABNORMAL HIGH (ref 70–99)
Glucose-Capillary: 122 mg/dL — ABNORMAL HIGH (ref 70–99)
Glucose-Capillary: 124 mg/dL — ABNORMAL HIGH (ref 70–99)
Glucose-Capillary: 127 mg/dL — ABNORMAL HIGH (ref 70–99)
Glucose-Capillary: 132 mg/dL — ABNORMAL HIGH (ref 70–99)
Glucose-Capillary: 145 mg/dL — ABNORMAL HIGH (ref 70–99)
Glucose-Capillary: 149 mg/dL — ABNORMAL HIGH (ref 70–99)
Glucose-Capillary: 157 mg/dL — ABNORMAL HIGH (ref 70–99)
Glucose-Capillary: 157 mg/dL — ABNORMAL HIGH (ref 70–99)
Glucose-Capillary: 165 mg/dL — ABNORMAL HIGH (ref 70–99)
Glucose-Capillary: 167 mg/dL — ABNORMAL HIGH (ref 70–99)
Glucose-Capillary: 177 mg/dL — ABNORMAL HIGH (ref 70–99)
Glucose-Capillary: 208 mg/dL — ABNORMAL HIGH (ref 70–99)
Glucose-Capillary: 237 mg/dL — ABNORMAL HIGH (ref 70–99)
Glucose-Capillary: 285 mg/dL — ABNORMAL HIGH (ref 70–99)
Glucose-Capillary: 65 mg/dL — ABNORMAL LOW (ref 70–99)
Glucose-Capillary: 72 mg/dL (ref 70–99)
Glucose-Capillary: 76 mg/dL (ref 70–99)
Glucose-Capillary: 84 mg/dL (ref 70–99)
Glucose-Capillary: 85 mg/dL (ref 70–99)
Glucose-Capillary: 88 mg/dL (ref 70–99)
Glucose-Capillary: 89 mg/dL (ref 70–99)
Glucose-Capillary: 91 mg/dL (ref 70–99)
Glucose-Capillary: 92 mg/dL (ref 70–99)
Glucose-Capillary: 93 mg/dL (ref 70–99)
Glucose-Capillary: 95 mg/dL (ref 70–99)
Glucose-Capillary: 95 mg/dL (ref 70–99)
Glucose-Capillary: 97 mg/dL (ref 70–99)
Glucose-Capillary: 98 mg/dL (ref 70–99)
Glucose-Capillary: 98 mg/dL (ref 70–99)
Glucose-Capillary: 99 mg/dL (ref 70–99)
Glucose-Capillary: 99 mg/dL (ref 70–99)

## 2010-09-30 LAB — COMPREHENSIVE METABOLIC PANEL
Albumin: 2.5 g/dL — ABNORMAL LOW (ref 3.5–5.2)
Alkaline Phosphatase: 61 U/L (ref 39–117)
BUN: 16 mg/dL (ref 6–23)
Calcium: 8.8 mg/dL (ref 8.4–10.5)
Potassium: 4.1 mEq/L (ref 3.5–5.1)
Sodium: 132 mEq/L — ABNORMAL LOW (ref 135–145)
Total Protein: 5.8 g/dL — ABNORMAL LOW (ref 6.0–8.3)

## 2010-09-30 LAB — CARDIAC PANEL(CRET KIN+CKTOT+MB+TROPI)
CK, MB: 1.1 ng/mL (ref 0.3–4.0)
Relative Index: 0.5 (ref 0.0–2.5)
Troponin I: 0.03 ng/mL (ref 0.00–0.06)

## 2010-09-30 LAB — PROTIME-INR
INR: 0.96 (ref 0.00–1.49)
INR: 1.01 (ref 0.00–1.49)
INR: 2.15 — ABNORMAL HIGH (ref 0.00–1.49)
INR: 2.61 — ABNORMAL HIGH (ref 0.00–1.49)
INR: 2.67 — ABNORMAL HIGH (ref 0.00–1.49)
INR: 2.89 — ABNORMAL HIGH (ref 0.00–1.49)
INR: 2.94 — ABNORMAL HIGH (ref 0.00–1.49)
Prothrombin Time: 12.7 seconds (ref 11.6–15.2)
Prothrombin Time: 23.8 seconds — ABNORMAL HIGH (ref 11.6–15.2)
Prothrombin Time: 26.4 seconds — ABNORMAL HIGH (ref 11.6–15.2)
Prothrombin Time: 27.7 seconds — ABNORMAL HIGH (ref 11.6–15.2)
Prothrombin Time: 28.2 seconds — ABNORMAL HIGH (ref 11.6–15.2)
Prothrombin Time: 30 seconds — ABNORMAL HIGH (ref 11.6–15.2)
Prothrombin Time: 31.9 seconds — ABNORMAL HIGH (ref 11.6–15.2)

## 2010-09-30 LAB — DIFFERENTIAL
Basophils Absolute: 0 10*3/uL (ref 0.0–0.1)
Basophils Relative: 0 % (ref 0–1)
Eosinophils Absolute: 0.1 10*3/uL (ref 0.0–0.7)
Eosinophils Relative: 1 % (ref 0–5)
Lymphocytes Relative: 34 % (ref 12–46)
Lymphs Abs: 1.7 10*3/uL (ref 0.7–4.0)
Lymphs Abs: 1.9 10*3/uL (ref 0.7–4.0)
Monocytes Absolute: 0.1 10*3/uL (ref 0.1–1.0)
Monocytes Relative: 2 % — ABNORMAL LOW (ref 3–12)
Myelocytes: 0 %
Neutro Abs: 4.2 10*3/uL (ref 1.7–7.7)
Neutrophils Relative %: 54 % (ref 43–77)
Neutrophils Relative %: 67 % (ref 43–77)

## 2010-09-30 LAB — URINE CULTURE
Colony Count: 4000
Colony Count: >=100000

## 2010-09-30 LAB — URINALYSIS, ROUTINE W REFLEX MICROSCOPIC
Bilirubin Urine: NEGATIVE
Glucose, UA: NEGATIVE mg/dL
Ketones, ur: NEGATIVE mg/dL
Protein, ur: NEGATIVE mg/dL
pH: 6 (ref 5.0–8.0)

## 2010-09-30 LAB — BRAIN NATRIURETIC PEPTIDE: Pro B Natriuretic peptide (BNP): 80 pg/mL (ref 0.0–100.0)

## 2010-10-04 LAB — CBC
HCT: 30.9 % — ABNORMAL LOW (ref 39.0–52.0)
Hemoglobin: 10.6 g/dL — ABNORMAL LOW (ref 13.0–17.0)
MCV: 90.2 fL (ref 78.0–100.0)
Platelets: 299 10*3/uL (ref 150–400)
RBC: 3.43 MIL/uL — ABNORMAL LOW (ref 4.22–5.81)
WBC: 4.8 10*3/uL (ref 4.0–10.5)

## 2010-10-04 LAB — GLUCOSE, CAPILLARY: Glucose-Capillary: 131 mg/dL — ABNORMAL HIGH (ref 70–99)

## 2010-10-05 LAB — GLUCOSE, CAPILLARY
Glucose-Capillary: 101 mg/dL — ABNORMAL HIGH (ref 70–99)
Glucose-Capillary: 101 mg/dL — ABNORMAL HIGH (ref 70–99)
Glucose-Capillary: 101 mg/dL — ABNORMAL HIGH (ref 70–99)
Glucose-Capillary: 104 mg/dL — ABNORMAL HIGH (ref 70–99)
Glucose-Capillary: 105 mg/dL — ABNORMAL HIGH (ref 70–99)
Glucose-Capillary: 105 mg/dL — ABNORMAL HIGH (ref 70–99)
Glucose-Capillary: 108 mg/dL — ABNORMAL HIGH (ref 70–99)
Glucose-Capillary: 112 mg/dL — ABNORMAL HIGH (ref 70–99)
Glucose-Capillary: 115 mg/dL — ABNORMAL HIGH (ref 70–99)
Glucose-Capillary: 117 mg/dL — ABNORMAL HIGH (ref 70–99)
Glucose-Capillary: 120 mg/dL — ABNORMAL HIGH (ref 70–99)
Glucose-Capillary: 120 mg/dL — ABNORMAL HIGH (ref 70–99)
Glucose-Capillary: 123 mg/dL — ABNORMAL HIGH (ref 70–99)
Glucose-Capillary: 125 mg/dL — ABNORMAL HIGH (ref 70–99)
Glucose-Capillary: 131 mg/dL — ABNORMAL HIGH (ref 70–99)
Glucose-Capillary: 133 mg/dL — ABNORMAL HIGH (ref 70–99)
Glucose-Capillary: 136 mg/dL — ABNORMAL HIGH (ref 70–99)
Glucose-Capillary: 136 mg/dL — ABNORMAL HIGH (ref 70–99)
Glucose-Capillary: 137 mg/dL — ABNORMAL HIGH (ref 70–99)
Glucose-Capillary: 149 mg/dL — ABNORMAL HIGH (ref 70–99)
Glucose-Capillary: 157 mg/dL — ABNORMAL HIGH (ref 70–99)
Glucose-Capillary: 158 mg/dL — ABNORMAL HIGH (ref 70–99)
Glucose-Capillary: 158 mg/dL — ABNORMAL HIGH (ref 70–99)
Glucose-Capillary: 161 mg/dL — ABNORMAL HIGH (ref 70–99)
Glucose-Capillary: 197 mg/dL — ABNORMAL HIGH (ref 70–99)
Glucose-Capillary: 82 mg/dL (ref 70–99)
Glucose-Capillary: 84 mg/dL (ref 70–99)
Glucose-Capillary: 85 mg/dL (ref 70–99)
Glucose-Capillary: 90 mg/dL (ref 70–99)
Glucose-Capillary: 91 mg/dL (ref 70–99)
Glucose-Capillary: 93 mg/dL (ref 70–99)
Glucose-Capillary: 94 mg/dL (ref 70–99)
Glucose-Capillary: 95 mg/dL (ref 70–99)
Glucose-Capillary: 96 mg/dL (ref 70–99)
Glucose-Capillary: 96 mg/dL (ref 70–99)

## 2010-10-23 ENCOUNTER — Encounter: Payer: Medicare Other | Attending: Physical Medicine & Rehabilitation | Admitting: Physical Medicine & Rehabilitation

## 2010-10-23 DIAGNOSIS — G4700 Insomnia, unspecified: Secondary | ICD-10-CM | POA: Insufficient documentation

## 2010-10-23 DIAGNOSIS — Z87440 Personal history of urinary (tract) infections: Secondary | ICD-10-CM | POA: Insufficient documentation

## 2010-10-23 DIAGNOSIS — M4712 Other spondylosis with myelopathy, cervical region: Secondary | ICD-10-CM

## 2010-10-23 DIAGNOSIS — M76899 Other specified enthesopathies of unspecified lower limb, excluding foot: Secondary | ICD-10-CM | POA: Insufficient documentation

## 2010-10-23 DIAGNOSIS — M5412 Radiculopathy, cervical region: Secondary | ICD-10-CM

## 2010-10-23 DIAGNOSIS — IMO0001 Reserved for inherently not codable concepts without codable children: Secondary | ICD-10-CM | POA: Insufficient documentation

## 2010-10-23 DIAGNOSIS — M62838 Other muscle spasm: Secondary | ICD-10-CM | POA: Insufficient documentation

## 2010-10-23 DIAGNOSIS — N319 Neuromuscular dysfunction of bladder, unspecified: Secondary | ICD-10-CM

## 2010-10-23 DIAGNOSIS — R5381 Other malaise: Secondary | ICD-10-CM | POA: Insufficient documentation

## 2010-10-24 NOTE — Assessment & Plan Note (Signed)
Shell is back regarding his cervical myelopathy.  He complains generally of fatigue and insomnia.  If I recall, he does have a history of chronic fatigue.  He states that he had increased left shoulder pain in the fall and Dr. Jordan Likes gave him Percocet for this which he uses on occasion.  The symptoms have resolved for the most part.  I asked him about the factors that keep him up at night and generally he feels restless and often he has spasms.  He does feel tired during the day like he wants to go sleep, but then when he tries to go to sleep at night, he is unable to. He has a new family doctor now and sees him apparently next month or two.  REVIEW OF SYSTEMS:  Notable for trouble walking, dizziness, weakness, tingling, and coughing.  Full 12-point review is in the written health and history section of the chart.  SOCIAL HISTORY:  The patient is married, living with wife who is with him today.  PHYSICAL EXAMINATION:  VITAL SIGNS:  Blood pressure is 133/83, pulse 64, and he is saturating 95% on room air. GENERAL:  The patient is pleasant and alert. MUSCULOSKELETAL:  He walked with me today and walked without the cane and had some instability in the knee.  The knee seemed to have difficulty with extension and weightbearing as well.  He walked better with his straight cane.  He has 4/5 strength in the right upper extremity except for the hand intrinsics which are more 3+ to 4/5 still. Right lower extremity has improved somewhat in the hip flexion at 2+ to 3/5, still is only 3/5 knee extension.  He is 4/5 to 5/5 elsewhere. NEUROLOGIC:  Sensory exam is intermittently diminished in the lower extremities.  Cognitively, he is alert and appropriate. HEART:  Regular. CHEST:  Clear. ABDOMEN:  Soft and nontender.  ASSESSMENT: 1. Cervical myelopathy with residual right greater than left weakness     and big sensory loss. 2. Bursitis of right hip. 3. History of urinary tract infections. 4.  Myofascial pain. 5. Persistent fatigue and insomnia.  PLAN: 1. Since spasms often keep him awake at night, I suggested trying a     muscle relaxant on a p.r.n. basis.  He may even try to use this on     a scheduled basis so this helps him sleep. 2. We will send him to [facility] for hinged right knee brace     for stability.  He needs to work on quad and hip muscle     strengthening if he wants to gain further control of the limb.  I     am not sure that he is doing that right now. 3. Regarding fatigue, I recommended that he follow up with his family     doctor to check labs, etc.  I recall that he has had problems with     this in the past with no clear reason being found for his symptoms.     He needs to work     on better sleep hygiene and activity during the day as well. 4. I will see him back in about 3 months.     Ranelle Oyster, M.D. Electronically Signed    ZTS/MedQ D:  10/23/2010 10:21:00  T:  10/24/2010 00:13:34  Job #:  [id number]  cc:   Dr. Eldred Manges [facility]

## 2010-11-27 NOTE — Discharge Summary (Signed)
[name], [name] NO.:  [id number]   MEDICAL RECORD NO.:  [id number]                   PATIENT TYPE:  INP   LOCATION:  4715                                 FACILITY:  [facility]   PHYSICIAN:  Pearlean Brownie, M.D.            DATE OF BIRTH:  Jun 29, 1936   DATE OF ADMISSION:  12/17/2002  DATE OF DISCHARGE:  12/19/2002                                 DISCHARGE SUMMARY   MEDICATIONS:  1. Aspirin 81 mg one tablet p.o. q.d.  2. Flomax 0.4 mg one capsule p.o. b.i.d.  3. Nitroglycerin 0.4 mg one tablet sublingual every five minutes as needed     for chest pain.  4. Tylenol 650 mg p.o. p.r.n. for pain q.4-6h.  5. Toprol-XL 100 mg one tablet p.o. q.d.  6. Mevacor 20 mg one tablet p.o. q.p.m.  7. Continue omeprazole at home dose.  8. Continue other home medications, do not take Viagra if nitroglycerin used     within 24 hours.   DISCHARGE DIAGNOSES:  1. Atypical angina.  2. Hypertension.  3. Hyperlipidemia.  4. Gastroesophageal reflux disease.  5. Benign prostatic hypertrophy.   DISCHARGE INSTRUCTIONS:  The patient to avoid strenuous exercise and return  to work as tolerated. Cardiac diet.   This is a 75 year old African-American male with history of hypertension,  GERD, kidney stones, BPH, and presenting with atypical angina and also  diagnosed with hyperlipidemia.   PROBLEM LIST:  1. Atypical angina. On admission, the patient presented with substernal     chest radiating to back and alleviated with rest. Labs show CK and CK-MB     elevation at 336 and 6.4 respectively. TI not elevated and at 0.01 with     refractory index at 1.9. Initial EKGs showed T wave inversion in aVF     which had resolved on subsequent EKG. Two-D echocardiogram results show     left ventricular size normal, left ventricular systolic function     decreased, ejection fraction 45% to 50%, diffuse left ventricular     hypokinesis, and left ventricular wall thickness.  Cardiolite results     showed no ischemia and with ejection fraction of 49%. The patient placed     on Lopressor, aspirin at 325 mg, and nitroglycerin sublingual.  2. Hypertension, controlled on Lopressor.  3. Gastroesophageal reflux disease. Controlled on Protonix.  4. Hyperlipidemia. The patient's 10-year risk of coronary heart disease at     20%, therefore started on Mevacor 20 mg one tablet by mouth q.d.   INSTRUCTIONS FOR DR Aram Beecham YOUNG:  The patient to get a followup 2-D  echocardiogram six months from date of discharge, being December 19, 2002.   Followup appointment made with Dr. Ocie Doyne at [facility] family  practice on Friday, December 28, 2002 at 1:30 p.m.     [name], M.D.  Pearlean Brownie, M.D.    NCR/MEDQ  D:  12/19/2002  T:  12/20/2002  Job:  [id number]

## 2010-11-27 NOTE — Consult Note (Signed)
[name], [name] NO.:  [id number]   MEDICAL RECORD NO.:  [id number]                   PATIENT TYPE:  INP   LOCATION:  4715                                 FACILITY:  [facility]   PHYSICIAN:  Rollene Rotunda, M.D.                DATE OF BIRTH:  August 21, 1935   DATE OF CONSULTATION:  12/17/2002  DATE OF DISCHARGE:                                   CONSULTATION   PRIMARY CARE PHYSICIAN: Ocie Doyne, M.D.   REASON FOR PRESENTATION:  Evaluate patient for chest pain.   HISTORY OF PRESENT ILLNESS:  The patient is a very pleasant 75 year old  African-American with a cardiac history of chest discomfort.  He apparently  had a Cardiolite which was negative for any evidence of ischemia, in 2001  (unfortunately I do not have his old records at this time).  He had been in  his usual state of health until developing chest discomfort on Saturday.  This happened at rest.  Seemed to be worse when he was in upright position.  Lying down would help the pain go away.  It was left sided under his left  breast.  He described it as sharp with radiation straight through to his  back.  He did not seem to notice it as exacerbated by exertion other than  the fact that he would be upright and noticed it more with this.  He did not  describe any fevers or chills.  He was not having a cough.  He was not  having associated nausea, vomiting or diaphoresis.  He did not feel  particularly short of breath.  He did not think this was like his previous  reflux.  It did get quite intense and was an 8 out of 10 in intensity.  It  is mentioned in the notes that he did get some chest pain when he came to  the emergency room and seemed to have some improvement with this.  Of note, sitting the patient up in the bed during his examination brought on  the discomfort.  It became more intense the longer he stood.  It was not  noticeable when he stood straight up.  It did go away during this  examination when he laid back down.   PAST MEDICAL HISTORY:  1. Mild hypertension (the patient does not really think he has had a problem     with this).  2. Gastroesophageal reflux disease.  3. Nephrolithiasis.  4. Benign prostatic hypertrophy.  5. Mildly elevated LDL (130 in the past).  6. Seasonal allergies.  7. Varicose veins.  8. L4-5 disk herniation.   PAST SURGICAL HISTORY:  TURP.   ALLERGIES:  None.   MEDICATIONS:  1. Flomax 0.4 mg b.i.d.  2. Flonase.  3. Humibid.  4. Mepirizole 20 mg daily.  5. Viagra.   SOCIAL HISTORY:  The patient did smoke for many  years one pack per day.  He  quit in 2000.  He operates a [employer].  He is married and has four children.   FAMILY HISTORY:  Unknown as the patient is adopted.   REVIEW OF SYSTEMS:  As stated in the HPI, otherwise negative for other  systems.   PHYSICAL EXAMINATION:  GENERAL: The patient is in no distress.  VITAL SIGNS: Blood pressure 134/86, heart rate 57 and regular.  HEENT: Pupils are equal, round and reactive to light.  Fundi not visualized.  Oral mucosa is unremarkable.  NECK: No jugular venous distension, wave form within normal limits, carotid  upstroke brisk and symmetric, no bruits.  No thyromegaly.  LYMPHATICS: No cervical, axillary, inguinal adenopathy.  LUNGS: Clear to auscultation, no crackles, no wheezing, no rubs.  BACK: No costovertebral angle tenderness.  CHEST: Unremarkable, no chest wall tenderness.  HEART: The PMI not displaced or sustained.  S1 and S2 are within normal  limits, no S3, no S4, no murmurs.  ABDOMEN: Obese, positive bowel sounds, normal in frequency and pitch, no  bruits, no rebound  guarding, no positive mass, hepatomegaly or  splenomegaly.  SKIN: No rash, nodules.  EXTREMITIES: Pulses 2+ throughout.  No edema.  NEUROLOGIC: Oriented to person, place and time, cranial nerves II-XII  grossly intact.  Motor grossly intact.   LABORATORY DATA:  EKG sinus rhythm, axis within normal  limits, intervals  within normal limits, early repolarization pattern, T wave inversions noted  in the inferior leads.   ASSESSMENT AND PLAN:  1. Chest discomfort. The patient's chest discomfort is very atypical for     angina.  It came on when he sat upright in his bed with his legs     stretched out in front of him.  He felt a pressure-like discomfort in his     upper chest that slowly became worse.  It went away with lying flat.  It     was not as noticeable when he was standing upright.  At this point I consider non anginal etiologies.  He could have pleural or  pericardial inflammation.  He could have musculoskeletal discomfort.  I  suggest serial enzymes and a repeat EKG in the morning.  Start with an  echocardiogram to evaluate the pericardium.  I would consider Cardiolite  based on the above evaluation.  Certainly  if he has any enzyme elevations or other effective evidence of ischemia he  would have a cardiac catheterization.  1. Hypertension.  The patient will need an antihypertensive.  This will be     per the Teaching Service.  2. Obesity.  He needs education about diet and exercise.                                               Rollene Rotunda, M.D.    JH/MEDQ  D:  12/18/2002  T:  12/18/2002  Job:  [id number]   cc:   Ocie Doyne, M.D.  [facility].  Family Practice Resident  [place]  [place] [address]  Fax: [phone]

## 2010-12-25 ENCOUNTER — Encounter (HOSPITAL_BASED_OUTPATIENT_CLINIC_OR_DEPARTMENT_OTHER): Payer: Medicare Other | Admitting: Oncology

## 2010-12-25 ENCOUNTER — Other Ambulatory Visit: Payer: Self-pay | Admitting: Oncology

## 2010-12-25 DIAGNOSIS — R42 Dizziness and giddiness: Secondary | ICD-10-CM

## 2010-12-25 DIAGNOSIS — D630 Anemia in neoplastic disease: Secondary | ICD-10-CM

## 2010-12-25 DIAGNOSIS — I82819 Embolism and thrombosis of superficial veins of unspecified lower extremities: Secondary | ICD-10-CM

## 2010-12-25 DIAGNOSIS — Z7901 Long term (current) use of anticoagulants: Secondary | ICD-10-CM

## 2010-12-25 DIAGNOSIS — C9000 Multiple myeloma not having achieved remission: Secondary | ICD-10-CM

## 2010-12-25 LAB — CBC WITH DIFFERENTIAL/PLATELET
Basophils Absolute: 0.1 10*3/uL (ref 0.0–0.1)
Eosinophils Absolute: 0.1 10*3/uL (ref 0.0–0.5)
HGB: 12.6 g/dL — ABNORMAL LOW (ref 13.0–17.1)
MCV: 85.2 fL (ref 79.3–98.0)
MONO#: 0.4 10*3/uL (ref 0.1–0.9)
MONO%: 6.6 % (ref 0.0–14.0)
NEUT#: 3.8 10*3/uL (ref 1.5–6.5)
RBC: 4.29 10*6/uL (ref 4.20–5.82)
RDW: 16.5 % — ABNORMAL HIGH (ref 11.0–14.6)
WBC: 6.3 10*3/uL (ref 4.0–10.3)
lymph#: 1.9 10*3/uL (ref 0.9–3.3)

## 2010-12-29 LAB — COMPREHENSIVE METABOLIC PANEL
Albumin: 3.6 g/dL (ref 3.5–5.2)
Alkaline Phosphatase: 48 U/L (ref 39–117)
Calcium: 8.9 mg/dL (ref 8.4–10.5)
Chloride: 104 mEq/L (ref 96–112)
Glucose, Bld: 152 mg/dL — ABNORMAL HIGH (ref 70–99)
Potassium: 4.1 mEq/L (ref 3.5–5.3)
Sodium: 138 mEq/L (ref 135–145)
Total Protein: 8.8 g/dL — ABNORMAL HIGH (ref 6.0–8.3)

## 2010-12-29 LAB — PROTEIN ELECTROPHORESIS, SERUM
Albumin ELP: 44.2 % — ABNORMAL LOW (ref 55.8–66.1)
Beta 2: 3 % — ABNORMAL LOW (ref 3.2–6.5)
Gamma Globulin: 38.1 % — ABNORMAL HIGH (ref 11.1–18.8)

## 2011-01-22 ENCOUNTER — Encounter: Payer: Medicare Other | Attending: Physical Medicine & Rehabilitation | Admitting: Physical Medicine & Rehabilitation

## 2011-01-22 DIAGNOSIS — R209 Unspecified disturbances of skin sensation: Secondary | ICD-10-CM | POA: Insufficient documentation

## 2011-01-22 DIAGNOSIS — R29898 Other symptoms and signs involving the musculoskeletal system: Secondary | ICD-10-CM | POA: Insufficient documentation

## 2011-01-22 DIAGNOSIS — N319 Neuromuscular dysfunction of bladder, unspecified: Secondary | ICD-10-CM

## 2011-01-22 DIAGNOSIS — R5381 Other malaise: Secondary | ICD-10-CM | POA: Insufficient documentation

## 2011-01-22 DIAGNOSIS — M76899 Other specified enthesopathies of unspecified lower limb, excluding foot: Secondary | ICD-10-CM | POA: Insufficient documentation

## 2011-01-22 DIAGNOSIS — G4730 Sleep apnea, unspecified: Secondary | ICD-10-CM | POA: Insufficient documentation

## 2011-01-22 DIAGNOSIS — M4712 Other spondylosis with myelopathy, cervical region: Secondary | ICD-10-CM

## 2011-01-22 DIAGNOSIS — I803 Phlebitis and thrombophlebitis of lower extremities, unspecified: Secondary | ICD-10-CM

## 2011-01-22 DIAGNOSIS — M79609 Pain in unspecified limb: Secondary | ICD-10-CM | POA: Insufficient documentation

## 2011-01-22 DIAGNOSIS — R42 Dizziness and giddiness: Secondary | ICD-10-CM | POA: Insufficient documentation

## 2011-01-22 DIAGNOSIS — R5383 Other fatigue: Secondary | ICD-10-CM | POA: Insufficient documentation

## 2011-01-22 DIAGNOSIS — G4700 Insomnia, unspecified: Secondary | ICD-10-CM | POA: Insufficient documentation

## 2011-01-22 NOTE — Assessment & Plan Note (Signed)
[name] is back regarding his cervical myelopathy.  Generally, he has doing much better.  The Robaxin at night has helped his leg spasms and allows him sleep.  He is still having complaints of dizziness during the day and decreased sleep quality overall.  Apparently, he has been diagnosed with sleep apnea and has a CPAP machine, but the mask does not fit appropriately.  He complains of some pain in the distal legs more in the left than right.  He complains of the anterior down through the top of the foot.  Pain is a 7/10 today, described as burning particularly in the legs.  He uses a cane for balance.  REVIEW OF SYSTEMS:  Notable for the above.  Full 12-point review is in the written health and history section of the chart.  SOCIAL HISTORY:  The patient is married and wife is with him today.  PHYSICAL EXAMINATION:  VITAL SIGNS:  Blood pressure 130/84, pulse 55, respiratory rate 15 and he is satting 95% on room air. GENERAL:  The patient is pleasant, alert and oriented x3.  Affect is generally bright and appropriate. EXTREMITIES:  The patient has mild diminishment of the sensation in the legs right more than left.  He had pain with heel cord stretching on either side left more than right.  Generalized tenderness in the calf as well as along the shin to a lesser extent.  Strength is grossly 4-5/5 throughout the hand intrinsics which were 3+-4/5 and hip flexion to 3/5. He was not wearing his right knee brace today. HEART:  Regular. CHEST:  Clear. ABDOMEN:  Soft and nontender. NEUROLOGIC:  Affect was pleasant.  ASSESSMENT: 1. Cervical myelopathy with residual right greater than left weakness     and sensory loss. 2. Bursitis of the right hip. 3. History of persistent fatigue, insomnia and dizziness.  He has     sleep apnea and has a ill-fitting CPAP mask. 4. Bilateral leg pain which is more posterior seems to be related to a     calf tenderness and tightness.  PLAN: 1. I recommend  regular stretching of his cast.  We discussed and     reviewed exercises today.  He also tried some heat and ice.  I     encouraged him to increase his physical activity tolerance as well. 2. He can continue with his right-hinged knee brace.  This helps some     for stability.  He can try loosening the Velcro when he is sitting     to help with comfort in that position. 3. Recommended checking on his CPAP machine to see if there is a     service number for this.  Likely is that he could check on options     for a facemask.  I would be happy to write him a new prescription     for another face mask. 4. In general, the patient is doing quite well.  He is off the     baclofen, Neurontin and Lidoderm.  He is only using the Robaxin     p.r.n. from me.  He does have refills on this.  I will see him     again in the future as needed.     Ranelle Oyster, M.D. Electronically Signed    ZTS/MedQ D:  01/22/2011 10:38:16  T:  01/22/2011 11:01:35  Job #:  [id number]

## 2011-03-05 ENCOUNTER — Encounter: Payer: Self-pay | Admitting: Internal Medicine

## 2011-03-05 ENCOUNTER — Ambulatory Visit (INDEPENDENT_AMBULATORY_CARE_PROVIDER_SITE_OTHER): Payer: Medicare Other | Admitting: Internal Medicine

## 2011-03-05 VITALS — BP 128/70 | HR 80 | Ht 72.0 in | Wt 245.0 lb

## 2011-03-05 DIAGNOSIS — G4733 Obstructive sleep apnea (adult) (pediatric): Secondary | ICD-10-CM

## 2011-03-05 NOTE — Patient Instructions (Signed)
Order- DRS--  Autotitrate CPAP 5- 20 cwp for pressure recommendation  X 2 weeks                           Refit CPAP mask for comfort- mask of choice, humidifier                     Dx  [facility]

## 2011-03-05 NOTE — Progress Notes (Signed)
Subjective:    Patient ID: [name], male    DOB: August 19, 1935, 75 y.o.   MRN: [id number]  HPI 03/05/11- 75 year old male former smoker seen because of obstructive sleep apnea on kind referral by Dr. Modesto Charon from [facility] in [place]. Wife(?) is here. A diagnostic sleep study on 01/31/2008 was done at [facility] because of insomnia with sleep apnea. This study confirmed moderately severe obstructive sleep apnea with an AHI of 22 per hour. A full face mask became uncomfortable, he got tired of it and stopped using CPAP several months to a year ago. Wife now reports loud snoring and again says he stays sleepy during the day although he has difficulty falling asleep. Bedtime between 8 and 9 PM with sleep latency at least to 30 minutes. He feels that he awakens and will lie awake or get up and wander around in the home portrait is half of each night. He finally gets up around 5 AM. When he is asleep he feels that he is sleeping well but this is quite fragmented. He admits drowsiness during the day if he sits quietly. Occasional cough in the morning. Feels smothered lying supine, better on his sides. Treated for hypertension and elevated cholesterol but he denies heart or lung disease. Denies ENT surgery.  He had smoked heavily but quit 20 years ago. Review of Systems Constitutional:   No-   weight loss, night sweats, fevers, chills, fatigue,        + lassitude- per history of present illness HEENT:   No-  headaches, difficulty swallowing, tooth/dental problems, sore throat,       No-  sneezing, itching, ear ache, nasal congestion, post nasal drip,  CV:  No-   chest pain, orthopnea, PND, swelling in lower extremities, anasarca, dizziness, palpitations Resp: No-   shortness of breath with exertion or at rest.              No-   productive cough,  No non-productive cough,  No-  coughing up of blood.              No-   change in color of mucus.  No- wheezing.   Skin: No-   rash or lesions. GI:  No-    heartburn, indigestion, abdominal pain, nausea, vomiting, diarrhea,                 change in bowel habits, loss of appetite GU: No-   dysuria, change in color of urine, no urgency or frequency.  No- flank pain. MS:  No-   joint pain or swelling.  No- decreased range of motion.  No- back pain. Neuro- normal:  Psych:  No- change in mood or affect. No depression or anxiety.  No memory loss.      Objective:   Physical Exam General- Alert, Oriented, Affect-appropriate, Distress- none acute,  overweight Skin- rash-none, lesions- none, excoriation- none Lymphadenopathy- none Head- atraumatic            Eyes- Gross vision intact, PERRLA, conjunctivae clear secretions            Ears- Hearing, canals-normal            Nose- Clear, no-Septal dev, mucus, polyps, erosion, perforation             Throat- Mallampati II-III , mucosa clear , drainage- none, tonsils- atrophic Neck- flexible , trachea midline, no stridor , thyroid nl, carotid no bruit Chest - symmetrical excursion , unlabored  Heart/CV- RRR , no murmur , no gallop  , no rub, nl s1 s2                           - JVD- none , edema- none, stasis changes- none, varices- none           Lung- clear to P&A, wheeze- none, cough- none , dullness-none, rub- none           Chest wall-  Abd- tender-no, distended-no, bowel sounds-present, HSM- no Br/ Gen/ Rectal- Not done, not indicated Extrem- cyanosis- none, clubbing, none, atrophy- none, strength- nl Neuro- grossly intact to observation         Assessment & Plan:

## 2011-03-08 ENCOUNTER — Encounter: Payer: Self-pay | Admitting: Internal Medicine

## 2011-03-08 DIAGNOSIS — G4733 Obstructive sleep apnea (adult) (pediatric): Secondary | ICD-10-CM | POA: Insufficient documentation

## 2011-03-08 NOTE — Assessment & Plan Note (Signed)
After appropriate discussion of his symptoms and sleep hygiene, medical concerns of untreated sleep apnea and available treatments, we're going to retry CPAP. We're setting up an auto titration for pressure recommendations with reconsideration of mask style.

## 2011-03-14 ENCOUNTER — Emergency Department (HOSPITAL_COMMUNITY): Payer: Medicare Other

## 2011-03-14 ENCOUNTER — Emergency Department (HOSPITAL_COMMUNITY)
Admission: EM | Admit: 2011-03-14 | Discharge: 2011-03-14 | Disposition: A | Discharge status: Home / Self Care | Payer: Medicare Other | Attending: Emergency Medicine | Admitting: Emergency Medicine

## 2011-03-14 DIAGNOSIS — R079 Chest pain, unspecified: Secondary | ICD-10-CM | POA: Insufficient documentation

## 2011-03-14 DIAGNOSIS — M542 Cervicalgia: Secondary | ICD-10-CM | POA: Insufficient documentation

## 2011-03-14 DIAGNOSIS — Z87898 Personal history of other specified conditions: Secondary | ICD-10-CM | POA: Insufficient documentation

## 2011-03-14 DIAGNOSIS — Z981 Arthrodesis status: Secondary | ICD-10-CM | POA: Insufficient documentation

## 2011-03-14 DIAGNOSIS — M549 Dorsalgia, unspecified: Secondary | ICD-10-CM | POA: Insufficient documentation

## 2011-03-26 ENCOUNTER — Encounter (HOSPITAL_BASED_OUTPATIENT_CLINIC_OR_DEPARTMENT_OTHER): Payer: Medicare Other | Admitting: Oncology

## 2011-03-26 ENCOUNTER — Other Ambulatory Visit: Payer: Self-pay | Admitting: Oncology

## 2011-03-26 DIAGNOSIS — Z7901 Long term (current) use of anticoagulants: Secondary | ICD-10-CM

## 2011-03-26 DIAGNOSIS — C9000 Multiple myeloma not having achieved remission: Secondary | ICD-10-CM

## 2011-03-26 DIAGNOSIS — R42 Dizziness and giddiness: Secondary | ICD-10-CM

## 2011-03-26 DIAGNOSIS — M542 Cervicalgia: Secondary | ICD-10-CM

## 2011-03-26 DIAGNOSIS — I82819 Embolism and thrombosis of superficial veins of unspecified lower extremities: Secondary | ICD-10-CM

## 2011-03-26 DIAGNOSIS — D630 Anemia in neoplastic disease: Secondary | ICD-10-CM

## 2011-03-26 LAB — CBC WITH DIFFERENTIAL/PLATELET
BASO%: 0.3 % (ref 0.0–2.0)
Basophils Absolute: 0 10*3/uL (ref 0.0–0.1)
EOS%: 1.9 % (ref 0.0–7.0)
HGB: 12.5 g/dL — ABNORMAL LOW (ref 13.0–17.1)
MCH: 30 pg (ref 27.2–33.4)
MCHC: 34.9 g/dL (ref 32.0–36.0)
RDW: 16.1 % — ABNORMAL HIGH (ref 11.0–14.6)
lymph#: 1.8 10*3/uL (ref 0.9–3.3)

## 2011-03-30 LAB — COMPREHENSIVE METABOLIC PANEL
ALT: 20 U/L (ref 0–53)
AST: 24 U/L (ref 0–37)
Albumin: 3.2 g/dL — ABNORMAL LOW (ref 3.5–5.2)
Calcium: 9 mg/dL (ref 8.4–10.5)
Chloride: 104 mEq/L (ref 96–112)
Potassium: 4 mEq/L (ref 3.5–5.3)
Sodium: 137 mEq/L (ref 135–145)

## 2011-03-30 LAB — PROTEIN ELECTROPHORESIS, SERUM
Gamma Globulin: 40.5 % — ABNORMAL HIGH (ref 11.1–18.8)
M-Spike, %: 3.38 g/dL

## 2011-04-08 ENCOUNTER — Encounter: Payer: Self-pay | Admitting: Internal Medicine

## 2011-04-23 ENCOUNTER — Encounter: Payer: Self-pay | Admitting: Oncology

## 2011-04-23 ENCOUNTER — Encounter: Payer: Self-pay | Admitting: Internal Medicine

## 2011-04-23 ENCOUNTER — Ambulatory Visit (INDEPENDENT_AMBULATORY_CARE_PROVIDER_SITE_OTHER): Payer: Medicare Other | Admitting: Internal Medicine

## 2011-04-23 VITALS — BP 150/88 | HR 54 | Ht 72.0 in | Wt 245.8 lb

## 2011-04-23 DIAGNOSIS — Z23 Encounter for immunization: Secondary | ICD-10-CM

## 2011-04-23 DIAGNOSIS — G4733 Obstructive sleep apnea (adult) (pediatric): Secondary | ICD-10-CM

## 2011-04-23 NOTE — Progress Notes (Signed)
Patient ID: [name], male    DOB: [birth year], 75 y.o.   MRN: [id number]  HPI 03/05/11- 75 year old male former smoker seen because of obstructive sleep apnea on kind referral by Dr. Modesto Charon from [facility] in [place]. Wife(?) is here. A diagnostic sleep study on 01/31/2008 was done at [facility] because of insomnia with sleep apnea. This study confirmed moderately severe obstructive sleep apnea with an AHI of 22 per hour. A full face mask became uncomfortable, he got tired of it and stopped using CPAP several months to a year ago. Wife now reports loud snoring and again says he stays sleepy during the day although he has difficulty falling asleep. Bedtime between 8 and 9 PM with sleep latency at least to 30 minutes. He feels that he awakens and will lie awake or get up and wander around in the home portrait is half of each night. He finally gets up around 5 AM. When he is asleep he feels that he is sleeping well but this is quite fragmented. He admits drowsiness during the day if he sits quietly. Occasional cough in the morning. Feels smothered lying supine, better on his sides. Treated for hypertension and elevated cholesterol but he denies heart or lung disease. Denies ENT surgery.  He had smoked heavily but quit 20 years ago.  04/23/11-  75 year old male former smoker seen because of obstructive sleep apnea. A male companion is with him. He has not been able to be compliant with CPAP, blaming pains in the back of his neck and spine related to wearing the CPAP. We discussed how it might be constraining motion until his muscles gets stiff. He wore it for one week out of the two-week auto titration trial. He has an older machine that worked well. He says his mask leak so that was unusable. His home care company would not replace it because he old money. He wants to skip the auto titration, go back to his old machine, change equipment supplier and get a new mask. We discussed CPAP, indications and  alternatives, medical concerns, and the documentation of adequate compliance to meet the Medicare rules. He isn't sure of the pressure setting on his old machine, which was one of the reasons we went for auto titration.  Review of Systems Constitutional:   No-   weight loss, night sweats, fevers, chills, fatigue,        + lassitude- per history of present illness HEENT:   No-  headaches, difficulty swallowing, tooth/dental problems, sore throat,       No-  sneezing, itching, ear ache, nasal congestion, post nasal drip,  CV:  No-   chest pain, orthopnea, PND, swelling in lower extremities, anasarca, dizziness, palpitations Resp: No-   shortness of breath with exertion or at rest.              No-   productive cough,  No non-productive cough,  No-  coughing up of blood.              No-   change in color of mucus.  No- wheezing.   Skin: No-   rash or lesions. GI:  No-   heartburn, indigestion, abdominal pain, nausea, vomiting, diarrhea,                 change in bowel habits, loss of appetite GU: No-   dysuria, change in color of urine, no urgency or frequency.  No- flank pain. MS:  No-   joint pain or  swelling.  No- decreased range of motion.  No- back pain. Neuro- normal:  Psych:  No- change in mood or affect. No depression or anxiety.  No memory loss.      Objective:   Physical Exam General- Alert, Oriented, Affect-appropriate, Distress- none acute,  Overweight, calm/laconic Skin- rash-none, lesions- none, excoriation- none Lymphadenopathy- none Head- atraumatic            Eyes- Gross vision intact, PERRLA, conjunctivae clear secretions            Ears- Hearing, canals-normal            Nose- Clear, no-Septal dev, mucus, polyps, erosion, perforation             Throat- Mallampati II-III , mucosa clear , drainage- none, tonsils- atrophic Neck- flexible , trachea midline, no stridor , thyroid nl, carotid no bruit Chest - symmetrical excursion , unlabored           Heart/CV- RRR , no  murmur , no gallop  , no rub, nl s1 s2                           - JVD- none , edema- none, stasis changes- none, varices- none           Lung- clear to P&A, wheeze- none, cough- none , dullness-none, rub- none           Chest wall-  Abd- tender-no, distended-no, bowel sounds-present, HSM- no Br/ Gen/ Rectal- Not done, not indicated Extrem- cyanosis- none, clubbing, none, atrophy- none, strength- nl Neuro- grossly intact to observation

## 2011-04-23 NOTE — Patient Instructions (Signed)
Order- [facility]- Pt would like to change DME from [facility] to anybody else             Needs replacement CPAP mask of choice- he has a working machine- will leave at current pressure setting  Flu vax

## 2011-04-25 NOTE — Assessment & Plan Note (Signed)
I think he would be willing to get started again with CPAP resolve the mask issue. He indicates willingness to pay off his current home care company as long as he can switch to a new one.

## 2011-05-04 ENCOUNTER — Telehealth: Payer: Self-pay | Admitting: Internal Medicine

## 2011-05-04 DIAGNOSIS — G4733 Obstructive sleep apnea (adult) (pediatric): Secondary | ICD-10-CM

## 2011-05-04 NOTE — Telephone Encounter (Signed)
Called, spoke with [place] with [facility].  She is requesting and order for a new mask and order to set pt's cpap on a set pressure (order will need to include pressure CDY wants pt set at).  Per orders placed on 04/23/11, [facility] was already given order to get pt a new mask of choice - I informed Mayra Reel of this.  She verbalized understanding of this and now only needs an order or pt's pressure.  Dr. Maple Hudson, please advise what pressure pt is supposed to be at.  Thanks!

## 2011-05-04 NOTE — Telephone Encounter (Signed)
Called, spoke with [place].  She is aware of below per CDY and advised will send order to [facility] for this.  She verbalized understanding of this.

## 2011-05-04 NOTE — Telephone Encounter (Signed)
Order- [facility]- replacement CPAP mask of choice and supplies             Set CPAP at fixed 10 cwp   Dx [facility]SA

## 2011-05-18 NOTE — Progress Notes (Signed)
This encounter was created in error - please disregard.

## 2011-05-28 ENCOUNTER — Other Ambulatory Visit: Payer: Self-pay | Admitting: Oncology

## 2011-05-28 ENCOUNTER — Ambulatory Visit (HOSPITAL_BASED_OUTPATIENT_CLINIC_OR_DEPARTMENT_OTHER): Payer: Medicare Other | Admitting: Nurse Practitioner

## 2011-05-28 ENCOUNTER — Other Ambulatory Visit (HOSPITAL_BASED_OUTPATIENT_CLINIC_OR_DEPARTMENT_OTHER): Payer: Medicare Other | Admitting: Lab

## 2011-05-28 VITALS — BP 143/83 | HR 59 | Temp 96.9°F | Ht 71.0 in | Wt 240.4 lb

## 2011-05-28 DIAGNOSIS — E119 Type 2 diabetes mellitus without complications: Secondary | ICD-10-CM

## 2011-05-28 DIAGNOSIS — I82890 Acute embolism and thrombosis of other specified veins: Secondary | ICD-10-CM

## 2011-05-28 DIAGNOSIS — C9000 Multiple myeloma not having achieved remission: Secondary | ICD-10-CM

## 2011-05-28 DIAGNOSIS — I82819 Embolism and thrombosis of superficial veins of unspecified lower extremities: Secondary | ICD-10-CM

## 2011-05-28 LAB — CBC WITH DIFFERENTIAL/PLATELET
EOS%: 2.5 % (ref 0.0–7.0)
Eosinophils Absolute: 0.1 10*3/uL (ref 0.0–0.5)
LYMPH%: 37.6 % (ref 14.0–49.0)
MCH: 29.7 pg (ref 27.2–33.4)
MCV: 86 fL (ref 79.3–98.0)
MONO%: 8.9 % (ref 0.0–14.0)
NEUT#: 2.9 10*3/uL (ref 1.5–6.5)
Platelets: 194 10*3/uL (ref 140–400)
RBC: 4.37 10*6/uL (ref 4.20–5.82)

## 2011-05-28 NOTE — Progress Notes (Signed)
OFFICE PROGRESS NOTE  Interval history:  Mr. Heier is a 75 year old man with multiple myeloma. He has been maintained off of specific therapy for myeloma since September 2010. Most recent serum M spike on 03/26/2011 was slightly more elevated at 3.38 as compared to 3.19 on 12/25/2010. He is seen today for scheduled followup.  Overall Mr. Soltau reports that he feels well. No interim illnesses or infections. He has a good appetite. No nausea or vomiting. He continues to have intermittent dizziness. He reports a recent diagnosis of sleep apnea. He is utilizing a CPAP at night time. He has intermittent constipation. He takes laxatives as needed. He has intermittent leg pain.  Mr. Eddings reports receiving the influenza vaccine this year. He reports receiving the pneumococcal vaccine within the past 5 years.   Objective: Temperature 96.9, heart rate 59, respirations 20, blood pressure 143/83, weight 240.4 pounds, height 71 inches  Oropharynx is without thrush or ulceration. Lungs are clear. No palpable cervical or supraclavicular lymph nodes. Regular cardiac rhythm. Abdomen is soft and nontender. No organomegaly. Extremities are without edema. Motor strength is 5 over 5 with the exception of slight weakness at the right hand.   Lab Results: Hemoglobin 13 white count 5.7 absolute neutrophil count 2.9 platelet count 194,002 (chemistry panel serum protein electrophoresis and serum immunoglobulins pending).   Studies/Results: No results found.  Medications: I have reviewed the patient's current medications.  Assessment/Plan: 1. Multiple myeloma, status post treatment with melphalan/prednisone/thalidomide beginning in November 2009.  The thalidomide was increased to 200 mg daily beginning 09/11/2008.  The serum M spike was slightly improved on 03/18/2009 and slightly increased on 05/09/2009.  He has been maintained off of specific therapy for multiple myeloma since September 2010.  The serum M spike  was higher in June 2012 and September 2012. 2. Chronic "dizziness." 3. Chest x-ray 11/01/2008 with a patchy opacity at the right midlung suspicious for pneumonia.  He was treated with Avelox. 4. Low back pain with a radicular component.  An MRI on 10/07/2008 showed no involvement of the lumbosacral spine with myeloma.  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 5. Bilateral neck pain, status post a CT 05/17/2008 with findings of spinal stenosis and osteoarthritis. 6. 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 7. Red cell microcytosis, likely related to hemoglobin C trait.  A ferritin level returned elevated and a stool Hemoccult was negative 01/23/2010.  He underwent a colonoscopy in 2009. 8. Elevated beta-2 microglobulin level. 9. History of hypertension. 10. History of a herniated disk at L4-L5 on MRI an 01/11/2002. 11. Hyperlipidemia. 12. Gastroesophageal reflux disease. 13. History of atypical angina. 14. History of rectal bleeding. 15. Superficial venous thrombosis of the greater saphenous vein on a Doppler 09/27/2008.  Negative for deep vein thrombosis.  Symptoms improved with aspirin. 16. Neck pain with numbness/tingling in the arms, hands, and low back with proximal right leg weakness.  An MRI of the cervical spine 05/30/2009 showed multilevel spondylosis with mild cord edema at C4-C5 and C5-C6.  He was noted to have an enhancing lesion of the cord at T3-T4 of unclear etiology.  In the lumbar spine there was no change in the spondylosis at L3-L4 and L4-L5.  There was no involvement of the cervical or lumbar spine with myeloma.  He is status post C4-C5, C5-C6, and C6-C7 anterior cervical diskectomy with fusion by Dr. Jordan Likes 08/01/2009. 17. History of mild thrombocytopenia. 18.  Indeterminate-age deep vein thrombosis of the left lower extremity on a venous Doppler 08/15/2009.  There  were also findings consistent with superficial thrombosis involving the right lower extremity 08/15/2009.  Coumadin was discontinued in October 2011. 19. Type 2 diabetes. 20. History of hematuria, followed at [facility]. 21. Disposition-Mr. Vert appears stable. The serum M spike was slightly higher in September of this year. There has been no other evidence for progression of the myeloma. We will followup on the serum M spike from today. Mr. Spickler will return for an office visit in 2 months. He will contact the office in the interim with any problems. Plan reviewed with Dr. Truett Perna.     Lonna Cobb ANP/GNP-BC

## 2011-06-01 LAB — COMPREHENSIVE METABOLIC PANEL
AST: 29 U/L (ref 0–37)
BUN: 21 mg/dL (ref 6–23)
Calcium: 9.2 mg/dL (ref 8.4–10.5)
Chloride: 103 mEq/L (ref 96–112)
Creatinine, Ser: 1.26 mg/dL (ref 0.50–1.35)

## 2011-06-01 LAB — PROTEIN ELECTROPHORESIS, SERUM
Albumin ELP: 40.7 % — ABNORMAL LOW (ref 55.8–66.1)
Alpha-1-Globulin: 2.7 % — ABNORMAL LOW (ref 2.9–4.9)
Alpha-2-Globulin: 7 % — ABNORMAL LOW (ref 7.1–11.8)
Beta 2: 2.6 % — ABNORMAL LOW (ref 3.2–6.5)
Beta Globulin: 3.6 % — ABNORMAL LOW (ref 4.7–7.2)
Gamma Globulin: 43.4 % — ABNORMAL HIGH (ref 11.1–18.8)

## 2011-06-01 LAB — IGG, IGA, IGM: IgM, Serum: 18 mg/dL — ABNORMAL LOW (ref 41–251)

## 2011-06-04 ENCOUNTER — Ambulatory Visit (INDEPENDENT_AMBULATORY_CARE_PROVIDER_SITE_OTHER): Payer: Medicare Other | Admitting: Internal Medicine

## 2011-06-04 ENCOUNTER — Encounter: Payer: Self-pay | Admitting: Internal Medicine

## 2011-06-04 VITALS — BP 124/80 | HR 60 | Ht 72.0 in | Wt 245.0 lb

## 2011-06-04 DIAGNOSIS — G4733 Obstructive sleep apnea (adult) (pediatric): Secondary | ICD-10-CM

## 2011-06-04 DIAGNOSIS — G4700 Insomnia, unspecified: Secondary | ICD-10-CM

## 2011-06-04 DIAGNOSIS — G4730 Sleep apnea, unspecified: Secondary | ICD-10-CM

## 2011-06-04 MED ORDER — TEMAZEPAM 15 MG PO CAPS: ORAL_CAPSULE | ORAL | Status: DC

## 2011-06-04 NOTE — Patient Instructions (Signed)
Try continuing your current CPAP settings, while adding temazepam as a sleeping pill if needed.   Script for temazepam   Take 1 or 2, a little before bedtime as needed

## 2011-06-04 NOTE — Progress Notes (Signed)
Patient ID: [name], male    DOB: Jul 28, 1935, 75 y.o.   MRN: [id number]  HPI 03/05/11- 75 year old male former smoker seen because of obstructive sleep apnea on kind referral by Dr. Modesto Charon from [facility] in [place]. Wife(?) is here. A diagnostic sleep study on 01/31/2008 was done at [facility] because of insomnia with sleep apnea. This study confirmed moderately severe obstructive sleep apnea with an AHI of 22 per hour. A full face mask became uncomfortable, he got tired of it and stopped using CPAP several months to a year ago. Wife now reports loud snoring and again says he stays sleepy during the day although he has difficulty falling asleep. Bedtime between 8 and 9 PM with sleep latency at least to 30 minutes. He feels that he awakens and will lie awake or get up and wander around in the home portrait is half of each night. He finally gets up around 5 AM. When he is asleep he feels that he is sleeping well but this is quite fragmented. He admits drowsiness during the day if he sits quietly. Occasional cough in the morning. Feels smothered lying supine, better on his sides. Treated for hypertension and elevated cholesterol but he denies heart or lung disease. Denies ENT surgery.  He had smoked heavily but quit 20 years ago.  04/23/11-  75 year old male former smoker seen because of obstructive sleep apnea. A male companion is with him. He has not been able to be compliant with CPAP, blaming pains in the back of his neck and spine related to wearing the CPAP. We discussed how it might be constraining motion until his muscles gets stiff. He wore it for one week out of the two-week auto titration trial. He has an older machine that worked well. He says his mask leak so that was unusable. His home care company would not replace it because he old money. He wants to skip the auto titration, go back to his old machine, change equipment supplier and get a new mask. We discussed CPAP, indications and  alternatives, medical concerns, and the documentation of adequate compliance to meet the Medicare rules. He isn't sure of the pressure setting on his old machine, which was one of the reasons we went for auto titration.  06/04/11- 75 year old male former smoker seen because of obstructive sleep apnea. Wife here. He got a replacement CPAP mask but is using his old machine. [facility] changed his pressure to 10. The current mask does not leak. He complains of insomnia with or without CPAP and that seems to be the biggest problem interfering with a sustained use of CPAP now.  Review of Systems Constitutional:   No-   weight loss, night sweats, fevers, chills, fatigue, lassitude HEENT:   No-  headaches, difficulty swallowing, tooth/dental problems, sore throat,       No-  sneezing, itching, ear ache, nasal congestion, post nasal drip,  CV:  No-   chest pain, orthopnea, PND, swelling in lower extremities, anasarca, dizziness, palpitations Resp: No-   shortness of breath with exertion or at rest.              No-   productive cough,  No non-productive cough,  No-  coughing up of blood.              No-   change in color of mucus.  No- wheezing.   Skin: No-   rash or lesions. GI:  No-   heartburn, indigestion, abdominal pain, nausea, vomiting, diarrhea,  change in bowel habits, loss of appetite GU: No-   dysuria, change in color of urine, no urgency or frequency.  No- flank pain. MS:  No-   joint pain or swelling.  No- decreased range of motion.  No- back pain. Neuro- normal:  Psych:  No- change in mood or affect. No depression or anxiety.  No memory loss.      Objective:   Physical Exam General- Alert, Oriented, Affect-appropriate, Distress- none acute,  Overweight, calm/laconic Skin- rash-none, lesions- none, excoriation- none Lymphadenopathy- none Head- atraumatic            Eyes- Gross vision intact, PERRLA, conjunctivae clear secretions            Ears- Hearing,  canals-normal            Nose- Clear, no-Septal dev, mucus, polyps, erosion, perforation             Throat- Mallampati II-III , mucosa clear , drainage- none, tonsils- atrophic Neck- flexible , trachea midline, no stridor , thyroid nl, carotid no bruit Chest - symmetrical excursion , unlabored           Heart/CV- RRR , no murmur , no gallop  , no rub, nl s1 s2                           - JVD- none , edema- none, stasis changes- none, varices- none           Lung- clear to P&A, wheeze- none, cough- none , dullness-none, rub- none           Chest wall-  Abd- tender-no, distended-no, bowel sounds-present, HSM- no Br/ Gen/ Rectal- Not done, not indicated Extrem- cyanosis- none, clubbing, none, atrophy- none, strength- nl Neuro- grossly intact to observation

## 2011-06-06 DIAGNOSIS — G4730 Sleep apnea, unspecified: Secondary | ICD-10-CM | POA: Insufficient documentation

## 2011-06-06 NOTE — Assessment & Plan Note (Signed)
I think he is more motivated now to make CPAP work and he has his wife's support. He is using it regularly and control seems to be good.

## 2011-06-06 NOTE — Assessment & Plan Note (Signed)
I'm not yet sure if insomnia is a chronic underlying problem or directly related to comfort with his sleep apnea and CPAP. We have educated on sleep hygiene and discussed options. He wants to address this directly. We're going to try to try temazepam.

## 2011-06-14 ENCOUNTER — Telehealth: Payer: Self-pay | Admitting: *Deleted

## 2011-06-14 NOTE — Telephone Encounter (Signed)
Message copied by Wandalee Ferdinand on Mon Jun 14, 2011  7:17 PM ------      Message from: Ladene Artist      Created: Fri Jun 11, 2011  7:31 AM       Please call patient, M-spike is higher.  Hb is ok. F/u as scheduled. We will consider starting treatment for MM next visit if M-spoke is again higher

## 2011-06-17 ENCOUNTER — Telehealth: Payer: Self-pay | Admitting: *Deleted

## 2011-06-17 NOTE — Telephone Encounter (Signed)
Message copied by Caleb Popp on Thu Jun 17, 2011  9:45 AM ------      Message from: Ladene Artist      Created: Fri Jun 11, 2011  7:31 AM       Please call patient, M-spike is higher.  Hb is ok. F/u as scheduled. We will consider starting treatment for MM next visit if M-spoke is again higher

## 2011-06-17 NOTE — Telephone Encounter (Signed)
Spoke with pt, MSpike results given. Pt verbalized understanding.

## 2011-07-23 ENCOUNTER — Telehealth: Payer: Self-pay | Admitting: Oncology

## 2011-07-23 ENCOUNTER — Other Ambulatory Visit (HOSPITAL_BASED_OUTPATIENT_CLINIC_OR_DEPARTMENT_OTHER): Payer: Medicare Other | Admitting: Lab

## 2011-07-23 ENCOUNTER — Telehealth: Payer: Self-pay | Admitting: *Deleted

## 2011-07-23 ENCOUNTER — Ambulatory Visit (HOSPITAL_BASED_OUTPATIENT_CLINIC_OR_DEPARTMENT_OTHER): Payer: Medicare Other

## 2011-07-23 ENCOUNTER — Ambulatory Visit (HOSPITAL_BASED_OUTPATIENT_CLINIC_OR_DEPARTMENT_OTHER): Payer: Medicare Other | Admitting: Oncology

## 2011-07-23 DIAGNOSIS — C888 Other malignant immunoproliferative diseases: Secondary | ICD-10-CM

## 2011-07-23 DIAGNOSIS — C9000 Multiple myeloma not having achieved remission: Secondary | ICD-10-CM

## 2011-07-23 DIAGNOSIS — M549 Dorsalgia, unspecified: Secondary | ICD-10-CM

## 2011-07-23 LAB — CBC WITH DIFFERENTIAL/PLATELET
Basophils Absolute: 0 10*3/uL (ref 0.0–0.1)
Eosinophils Absolute: 0.1 10*3/uL (ref 0.0–0.5)
HGB: 12.4 g/dL — ABNORMAL LOW (ref 13.0–17.1)
LYMPH%: 44.8 % (ref 14.0–49.0)
MCV: 79.9 fL (ref 79.3–98.0)
MONO#: 0.4 10*3/uL (ref 0.1–0.9)
MONO%: 7 % (ref 0.0–14.0)
NEUT#: 2.3 10*3/uL (ref 1.5–6.5)
Platelets: 210 10*3/uL (ref 140–400)
RBC: 4.27 10*6/uL (ref 4.20–5.82)
RDW: 15.7 % — ABNORMAL HIGH (ref 11.0–14.6)
WBC: 5.1 10*3/uL (ref 4.0–10.3)
nRBC: 0 % (ref 0–0)

## 2011-07-23 LAB — URINALYSIS, MICROSCOPIC - CHCC
Blood: NEGATIVE
Nitrite: NEGATIVE
Protein: <30 mg/dL
Specific Gravity, Urine: 1.03 (ref 1.003–1.035)
pH: 6 (ref 4.6–8.0)

## 2011-07-23 NOTE — Telephone Encounter (Signed)
Called pt, urinalysis negative for blood. Follow up as scheduled. Pt verbalized understanding.

## 2011-07-23 NOTE — Progress Notes (Signed)
OFFICE PROGRESS NOTE   INTERVAL HISTORY:   She returns as scheduled. He reports stable back pain. He does not have significant neck pain at present. He reports several episodes of dark urine.  Objective:  Vital signs in last 24 hours:  Blood pressure 179/97, pulse 60, temperature 97.5 F (36.4 C), temperature source Oral, height 6' (1.829 m), weight 244 lb 6.4 oz (110.859 kg).    HEENT: Neck without mass Lymphatics: No cervical, supraclavicular, axillary, or inguinal lymph nodes Resp: Lungs clear bilaterally Cardio: Regular rate and rhythm GI: No hepatosplenomegaly Vascular: No leg edema    Lab Results:  Lab Results  Component Value Date   WBC 5.1 07/23/2011   HGB 12.4* 07/23/2011   HCT 34.1* 07/23/2011   MCV 79.9 07/23/2011   PLT 210 07/23/2011   ANC 2.3 Urinalysis: Negative for bilirubin, blood, and red cells   Medications: I have reviewed the patient's current medications.  Assessment/Plan: 1.Multiple myeloma, status post treatment with melphalan/prednisone/thalidomide beginning in November 2009. The thalidomide was increased to 200 mg daily beginning 09/11/2008. The serum M spike was slightly improved on 03/18/2009 and slightly increased on 05/09/2009. He has been maintained off of specific therapy for multiple myeloma since September 2010. The M spike was higher in  November of 2012.  2. Chronic "dizziness."  3. Chest x-ray 11/01/2008 with a patchy opacity at the right midlung suspicious for pneumonia. He was treated with Avelox.   4.Low back pain with a radicular component. An MRI on 10/07/2008 showed no involvement of the lumbosacral spine with myeloma. 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   5.Bilateral neck pain, status post a CT 05/17/2008 with findings of spinal stenosis and osteoarthritis.   6. 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  7.  Red cell microcytosis, likely related to hemoglobin C trait. A ferritin level returned elevated and a stool Hemoccult was negative 01/23/2010. He underwent a colonoscopy in 2009.  8. Elevated beta-2 microglobulin level.   9.History of hypertension.   10.History of a herniated disk  at L4-L5 on MRI an 01/11/2002.   11.Hyperlipidemia.   12.Gastroesophageal reflux disease.   13.History of atypical angina.   14.History of rectal bleeding  15 Superficial venous thrombosis of the greater saphenous vein on a Doppler 09/27/2008. Negative for deep vein thrombosis. Symptoms improved with aspirin.  16. Neck pain with numbness/tingling in the arms, hands, and low back with proximal right leg weakness. An MRI of the cervical spine 05/30/2009 showed multilevel spondylosis with mild cord edema at C4-C5 and C5-C6. He was noted to have an enhancing lesion of the cord at T3-T4 of unclear etiology. In the lumbar spine there was no change in the spondylosis at L3-L4 and L4-L5. There was no involvement of the cervical or lumbar spine with myeloma. He is status post C4-C5, C5-C6, and C6-C7 anterior cervical diskectomy with fusion by Dr. Jordan Likes 08/01/2009.   17.History of mild thrombocytopenia  18. Indeterminate-age deep vein thrombosis of the left lower extremity on a venous Doppler 08/15/2009. There were also findings consistent with superficial thrombosis involving the right lower extremity 08/15/2009. Coumadin was discontinued in October 2011.   19.Type 2 diabetes.  20. History of hematuria, followed at [facility]-a urinalysis is negative for blood today.    Disposition:  Mr. Falotico appears stable. He appears to have indolent multiple myeloma. The plan is to continue an observation approach. He will return for  an office and lab visit in 3 months.   Lucile Shutters, MD  07/23/2011  1:33 PM

## 2011-07-23 NOTE — Telephone Encounter (Signed)
Pt sent back to the lab today and appts made/printed  for 4/12   aom

## 2011-07-25 IMAGING — CR DG ABD PORTABLE 1V
1 series · 1 of 1 positions shown · non-contrast
Comparison: CT dated 03/06/2004.

CLINICAL DATA: Abdominal pain and constipation for the past week.

ABDOMEN - 1 VIEW

[view not recorded]
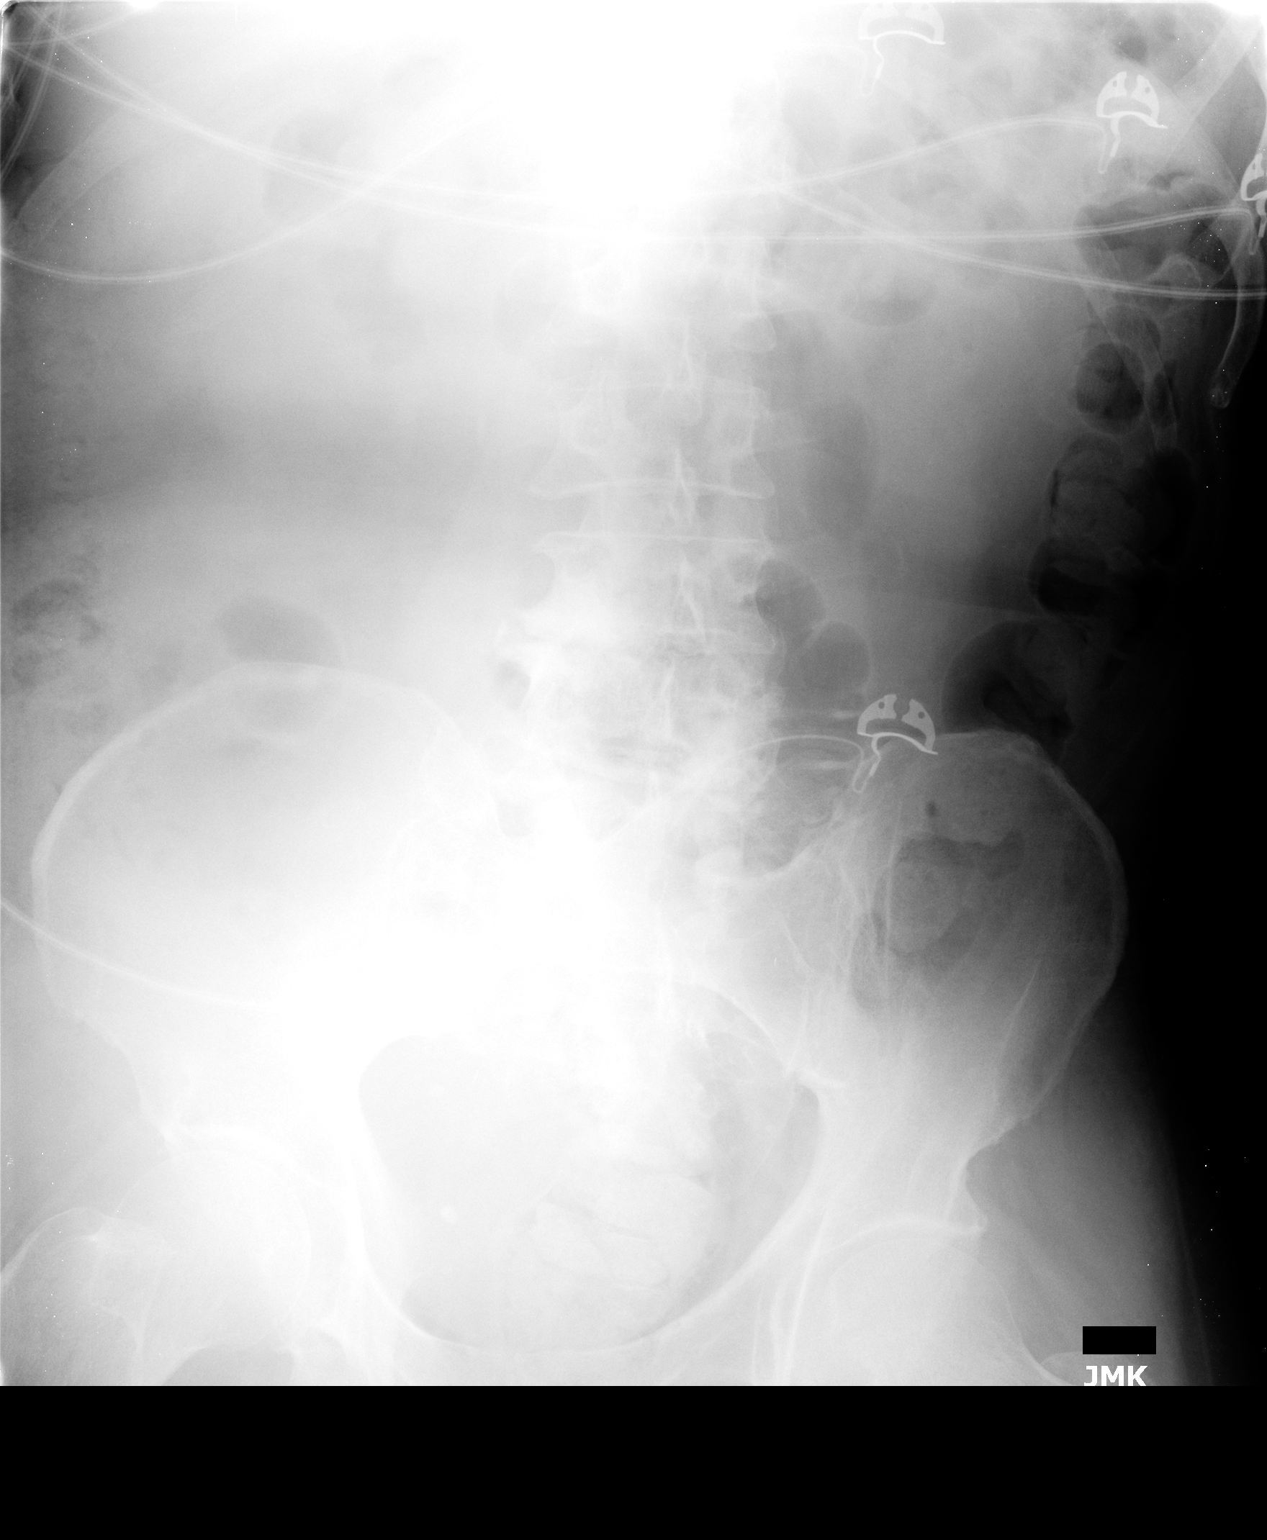

[1 of 1 positions shown; findings below may reference images not displayed]

FINDINGS: Normal bowel gas pattern.  Lower lumbar spine
degenerative changes.  Probable right pelvic phlebolith.
Suboptimal portable technique.
IMPRESSION: Limited examination demonstrating no gross acute abnormality.

## 2011-07-27 LAB — COMPREHENSIVE METABOLIC PANEL
ALT: 19 U/L (ref 0–53)
CO2: 26 mEq/L (ref 19–32)
Calcium: 8.7 mg/dL (ref 8.4–10.5)
Chloride: 104 mEq/L (ref 96–112)
Glucose, Bld: 94 mg/dL (ref 70–99)
Sodium: 135 mEq/L (ref 135–145)
Total Bilirubin: 0.4 mg/dL (ref 0.3–1.2)
Total Protein: 9.5 g/dL — ABNORMAL HIGH (ref 6.0–8.3)

## 2011-07-27 LAB — PROTEIN ELECTROPHORESIS, SERUM
Albumin ELP: 39.5 % — ABNORMAL LOW (ref 55.8–66.1)
Alpha-1-Globulin: 2.6 % — ABNORMAL LOW (ref 2.9–4.9)
Alpha-2-Globulin: 6.5 % — ABNORMAL LOW (ref 7.1–11.8)
Beta Globulin: 3.5 % — ABNORMAL LOW (ref 4.7–7.2)
Total Protein, Serum Electrophoresis: 9.5 g/dL — ABNORMAL HIGH (ref 6.0–8.3)

## 2011-07-30 ENCOUNTER — Ambulatory Visit (INDEPENDENT_AMBULATORY_CARE_PROVIDER_SITE_OTHER): Payer: Medicare Other | Admitting: Internal Medicine

## 2011-07-30 ENCOUNTER — Encounter: Payer: Self-pay | Admitting: Internal Medicine

## 2011-07-30 VITALS — BP 140/80 | HR 63 | Ht 72.0 in | Wt 246.0 lb

## 2011-07-30 DIAGNOSIS — G4730 Sleep apnea, unspecified: Secondary | ICD-10-CM

## 2011-07-30 DIAGNOSIS — G4700 Insomnia, unspecified: Secondary | ICD-10-CM

## 2011-07-30 DIAGNOSIS — G4733 Obstructive sleep apnea (adult) (pediatric): Secondary | ICD-10-CM

## 2011-07-30 NOTE — Patient Instructions (Signed)
Order- NPSG split protocol    Dx OSA 

## 2011-07-30 NOTE — Progress Notes (Signed)
Patient ID: [name], male    DOB: 1935-10-06, 76 y.o.   MRN: [id number]  HPI 03/05/11- 76 year old male former smoker seen because of obstructive sleep apnea on kind referral by Dr. Modesto Charon from [facility] in [place]. Wife(?) is here. A diagnostic sleep study on 01/31/2008 was done at [facility] because of insomnia with sleep apnea. This study confirmed moderately severe obstructive sleep apnea with an AHI of 22 per hour. A full face mask became uncomfortable, he got tired of it and stopped using CPAP several months to a year ago. Wife now reports loud snoring and again says he stays sleepy during the day although he has difficulty falling asleep. Bedtime between 8 and 9 PM with sleep latency at least to 30 minutes. He feels that he awakens and will lie awake or get up and wander around in the home  half of each night. He finally gets up around 5 AM. When he is asleep he feels that he is sleeping well but this is quite fragmented. He admits drowsiness during the day if he sits quietly. Occasional cough in the morning. Feels smothered lying supine, better on his sides. Treated for hypertension and elevated cholesterol but he denies heart or lung disease. Denies ENT surgery.  He had smoked heavily but quit 20 years ago.  04/23/11-  76 year old male former smoker seen because of obstructive sleep apnea. A male companion is with him. He has not been able to be compliant with CPAP, blaming pains in the back of his neck and spine related to wearing the CPAP. We discussed how it might be constraining motion until his muscles gets stiff. He wore it for one week out of the two-week auto titration trial. He has an older machine that worked well. He says his mask leak so that was unusable. His home care company would not replace it because he old money. He wants to skip the auto titration, go back to his old machine, change equipment supplier and get a new mask. We discussed CPAP, indications and alternatives,  medical concerns, and the documentation of adequate compliance to meet the Medicare rules. He isn't sure of the pressure setting on his old machine, which was one of the reasons we went for auto titration.  06/04/11- 76 year old male former smoker seen because of obstructive sleep apnea. Wife here. He got a replacement CPAP mask but is using his old machine. [facility] changed his pressure to 10. The current mask does not leak. He complains of insomnia with or without CPAP and that seems to be the biggest problem interfering with a sustained use of CPAP now.  07/30/11- 76 year old male former smoker seen because of obstructive sleep apnea. Wife here He try his old CPAP machine again but complains that humidifier gurgles in the flows even when he tries putting it lower than the level of his head so water runs back into the machine. He tried without she notify her but didn't like that either. Wife doesn't think CPAP stopped his snoring. He asks for a new sleep study which is the documentation we really need. He is sometimes restless at night even if he takes a sleeping pill.  Review of Systems Constitutional:   No-   weight loss, night sweats, fevers, chills, fatigue, lassitude HEENT:   No-  headaches, difficulty swallowing, tooth/dental problems, sore throat,       No-  sneezing, itching, ear ache, nasal congestion, post nasal drip,  CV:  No-   chest pain, orthopnea, PND,  swelling in lower extremities, anasarca, dizziness, palpitations Resp: No-   shortness of breath with exertion or at rest.              No-   productive cough,  No non-productive cough,  No-  coughing up of blood.              No-   change in color of mucus.  No- wheezing.   Skin: No-   rash or lesions. GI:  No-   heartburn, indigestion, abdominal pain, nausea, vomiting, diarrhea,                 change in bowel habits, loss of appetite GU: MS:  No-   joint pain or swelling.  No- decreased range of motion.  No- back pain. Neuro-  normal:  Psych:  No- change in mood or affect. No depression or anxiety.  No memory loss.      Objective:   Physical Exam General- Alert, Oriented, Affect-appropriate, Distress- none acute,  Overweight, calm/laconic but seems intelligent Skin- rash-none, lesions- none, excoriation- none Lymphadenopathy- none Head- atraumatic            Eyes- Gross vision intact, PERRLA, conjunctivae clear secretions            Ears- Hearing, canals-normal            Nose- Clear, no-Septal dev, mucus, polyps, erosion, perforation             Throat- Mallampati II-III , mucosa clear , drainage- none, tonsils- atrophic Neck- flexible , trachea midline, no stridor , thyroid nl, carotid no bruit Chest - symmetrical excursion , unlabored           Heart/CV- RRR , no murmur , no gallop  , no rub, nl s1 s2                           - JVD- none , edema- none, stasis changes- none, varices- none           Lung- clear to P&A, wheeze- none, cough- none , dullness-none, rub- none           Chest wall-  Abd- Br/ Gen/ Rectal- Not done, not indicated Extrem- cyanosis- none, clubbing, none, atrophy- none, strength- nl Neuro- grossly intact to observation

## 2011-08-01 NOTE — Assessment & Plan Note (Signed)
He has not been able to maintain CPAP compliance since original testing 3 years ago. Not strongly motivated. He is willing to have an a sleep study done so that we can tell what we are doing. I have educated him and his wife again about the medical issues of sleep apnea and an overview of treatment options.

## 2011-08-02 NOTE — Telephone Encounter (Signed)
Error. [name], CMA  

## 2011-08-22 ENCOUNTER — Ambulatory Visit (HOSPITAL_BASED_OUTPATIENT_CLINIC_OR_DEPARTMENT_OTHER): Payer: Medicare Other | Attending: Internal Medicine | Admitting: General Practice

## 2011-08-22 VITALS — Ht 72.0 in | Wt 243.0 lb

## 2011-08-22 DIAGNOSIS — G4733 Obstructive sleep apnea (adult) (pediatric): Secondary | ICD-10-CM | POA: Insufficient documentation

## 2011-08-22 DIAGNOSIS — R259 Unspecified abnormal involuntary movements: Secondary | ICD-10-CM | POA: Insufficient documentation

## 2011-08-22 DIAGNOSIS — Z79899 Other long term (current) drug therapy: Secondary | ICD-10-CM | POA: Insufficient documentation

## 2011-08-28 DIAGNOSIS — G4733 Obstructive sleep apnea (adult) (pediatric): Secondary | ICD-10-CM

## 2011-08-28 DIAGNOSIS — R0989 Other specified symptoms and signs involving the circulatory and respiratory systems: Secondary | ICD-10-CM

## 2011-08-28 DIAGNOSIS — M62838 Other muscle spasm: Secondary | ICD-10-CM

## 2011-08-28 DIAGNOSIS — R0609 Other forms of dyspnea: Secondary | ICD-10-CM

## 2011-08-29 NOTE — Procedures (Signed)
NAMEKOTY, ANCTIL                 ACCOUNT NO.:  [id number]  MEDICAL RECORD NO.:  [id number]          PATIENT TYPE:  OUT  LOCATION:  SLEEP CENTER                 FACILITY[facility]  [facility]  PHYSICIAN:  Natalin Bible D. Maple Hudson, MD, FCCP, FACPDATE OF BIRTH:  August 30, 1937  DATE OF STUDY:  08/22/2011                           NOCTURNAL POLYSOMNOGRAM  REFERRING PHYSICIAN:  Montravious Weigelt D. Tresean Mattix, MD, FCCP, FACP  INDICATION FOR STUDY:  Hypersomnia with sleep apnea.  EPWORTH SLEEPINESS SCORE:  0/24.  BMI 33, weight 243 pounds, height 72 inches.  Neck 16.5 inch.  MEDICATIONS:  Home medications charted and reviewed.  SLEEP ARCHITECTURE:  Split study protocol.  During the diagnostic phase, total sleep time 154.5 minutes with sleep efficiency 72.5%.  Stage I was 8.7%, stage II 65.4%, stage III absent.  REM 25.9% of total sleep time. Sleep latency 10.5 minutes.  REM latency 132 minutes.  Awake after sleep onset 49 minutes.  Arousal index 14.  BEDTIME MEDICATION:  Temazepam.  RESPIRATORY DATA:  Apnea-hypopnea index (AHI) 26.8 per hour.  A total of 69 events were scored including 25 obstructive apneas, 44 hypopneas. Events were scored as nonsupine.  REM AHI 69 per hour.  CPAP was then titrated to 9 CWP, AHI 0.5 per hour.  He wore a small Respironics ComfortGel mask with heated humidifier, and a C-Flex setting of 3. Supplemental oxygen was added.  OXYGEN DATA:  Moderately loud snoring before CPAP with oxygen desaturation to a nadir of 77% on room air.  When oxygen saturations remained low, supplemental oxygen was added at 2 L/minute because of persistent desaturation none related to respiratory events.  With CPAP titration, snoring was prevented, and mean oxygen saturation held at 91%.  CARDIAC DATA:  Sinus rhythm with PVCs.  MOVEMENT-PARASOMNIA:  Frequent limb jerks with little effect on sleep. During the diagnostic phase, a total of 136 limb jerks were associated with 8 arousals for an index of 3.1 per hour.   During the titration phase, a total of 292 limb jerks were recorded of which 9 were associated with arousal or awakening for periodic limb movement with arousal index of 2.5 per hour.  Bathroom x2.  IMPRESSIONS-RECOMMENDATIONS: 1. Moderate obstructive sleep apnea/hypopnea syndrome, apnea-hypopnea     index 26.8 per hour with nonsupine events, moderately loud snoring,     and oxygen desaturation to a nadir of 77% on room air. 2. CPAP successfully titrated to 9 CWP, apnea-hypopnea index 0.5 per     hour.  He wore a small Respironics ComfortGel mask with heated     humidifier, C-Flex of 3, and supplemental oxygen at 2 L/minute. 3. Supplemental oxygen was added at 2 L/minute because of persistent     oxygen desaturation with a nadir of 77% and a mean saturation of     88.3% on room air.  A total of 46.3 minutes was recorded during     this study with saturation less than 88%. 4. Frequent limb jerks with little effect on sleep.  If limb movement     is determined to be a significant cause of sleep disturbance in the     home environment after adjustment to CPAP, then consider  a trial of     specific therapy, such as ReQuip or Mirapex if clinically     appropriate.     Kyree Adriano D. Maple Hudson, MD, [facility], FACP Diplomate, American Board of Sleep Medicine    CDY/MEDQ  D:  08/28/2011 12:06:27  T:  08/29/2011 03:02:23  Job:  [id number]

## 2011-09-02 ENCOUNTER — Ambulatory Visit (INDEPENDENT_AMBULATORY_CARE_PROVIDER_SITE_OTHER): Payer: Medicare Other | Admitting: Internal Medicine

## 2011-09-02 ENCOUNTER — Encounter: Payer: Self-pay | Admitting: Internal Medicine

## 2011-09-02 VITALS — BP 122/72 | HR 72 | Ht 72.0 in | Wt 240.4 lb

## 2011-09-02 DIAGNOSIS — G4733 Obstructive sleep apnea (adult) (pediatric): Secondary | ICD-10-CM

## 2011-09-02 NOTE — Patient Instructions (Signed)
Order-  [facility]-  Replacement CPAP 9 cwp, heated humidifier, mask of choice and supplies    Dx OSA

## 2011-09-02 NOTE — Progress Notes (Signed)
Patient ID: [name], male    DOB: 04/08/1936, 76 y.o.   MRN: [id number]  HPI 03/05/11- 76 year old male former smoker seen because of obstructive sleep apnea on kind referral by Dr. Modesto Charon from [facility] in [place]. Wife(?) is here. A diagnostic sleep study on 01/31/2008 was done at [facility] because of insomnia with sleep apnea. This study confirmed moderately severe obstructive sleep apnea with an AHI of 22 per hour. A full face mask became uncomfortable, he got tired of it and stopped using CPAP several months to a year ago. Wife now reports loud snoring and again says he stays sleepy during the day although he has difficulty falling asleep. Bedtime between 8 and 9 PM with sleep latency at least to 30 minutes. He feels that he awakens and will lie awake or get up and wander around in the home  half of each night. He finally gets up around 5 AM. When he is asleep he feels that he is sleeping well but this is quite fragmented. He admits drowsiness during the day if he sits quietly. Occasional cough in the morning. Feels smothered lying supine, better on his sides. Treated for hypertension and elevated cholesterol but he denies heart or lung disease. Denies ENT surgery.  He had smoked heavily but quit 20 years ago.  04/23/11-  76 year old male former smoker seen because of obstructive sleep apnea. A male companion is with him. He has not been able to be compliant with CPAP, blaming pains in the back of his neck and spine related to wearing the CPAP. We discussed how it might be constraining motion until his muscles gets stiff. He wore it for one week out of the two-week auto titration trial. He has an older machine that worked well. He says his mask leak so that was unusable. His home care company would not replace it because he old money. He wants to skip the auto titration, go back to his old machine, change equipment supplier and get a new mask. We discussed CPAP, indications and alternatives,  medical concerns, and the documentation of adequate compliance to meet the Medicare rules. He isn't sure of the pressure setting on his old machine, which was one of the reasons we went for auto titration.  06/04/11- 76 year old male former smoker seen because of obstructive sleep apnea. Wife here. He got a replacement CPAP mask but is using his old machine. [facility] changed his pressure to 10. The current mask does not leak. He complains of insomnia with or without CPAP and that seems to be the biggest problem interfering with a sustained use of CPAP now.  07/30/11- 76 year old male former smoker seen because of obstructive sleep apnea. Wife here He try his old CPAP machine again but complains that humidifier gurgles in the flows even when he tries putting it lower than the level of his head so water runs back into the machine. He tried without she notify her but didn't like that either. Wife doesn't think CPAP stopped his snoring. He asks for a new sleep study which is the documentation we really need. He is sometimes restless at night even if he takes a sleeping pill.  09/02/19- 76 year old male former smoker seen because of obstructive sleep apnea.  NPSG 08/22/11- moderate OSA, AHI 26.8/hr with CPAP titration to 9 cwp for AHI 0.5/ hr. Oxygen was added at 2 L/M due to desaturation, and will need follow-up.  Review of Systems-see HPI Constitutional:   No-   weight loss, night sweats,  fevers, chills, +fatigue, lassitude HEENT:   No-  headaches, difficulty swallowing, tooth/dental problems, sore throat,       No-  sneezing, itching, ear ache, nasal congestion, post nasal drip,  CV:  No-   chest pain, orthopnea, PND, swelling in lower extremities, anasarca, dizziness, palpitations Resp: No-   shortness of breath with exertion or at rest.              No-   productive cough,  No non-productive cough,  No-  coughing up of blood.              No-   change in color of mucus.  No- wheezing.   Skin: No-    rash or lesions. GI:  No-   heartburn, indigestion, abdominal pain, nausea, vomiting, diarrhea,                 change in bowel habits, loss of appetite GU: MS:  No-   joint pain or swelling.  No- decreased range of motion.  No- back pain. Neuro- normal:  Psych:  No- change in mood or affect. No depression or anxiety.  No memory loss.      Objective:   Physical Exam General- Alert, Oriented, Affect-appropriate, Distress- none acute,  Overweight, calm/laconic but seems intelligent Skin- rash-none, lesions- none, excoriation- none Lymphadenopathy- none Head- atraumatic            Eyes- Gross vision intact, PERRLA, conjunctivae clear secretions, strabismus            Ears- Hearing, canals-normal            Nose- Clear, no-Septal dev, mucus, polyps, erosion, perforation             Throat- Mallampati II-III , mucosa clear , drainage- none, tonsils- atrophic Neck- flexible , trachea midline, no stridor , thyroid nl, carotid no bruit Chest - symmetrical excursion , unlabored           Heart/CV- RRR , no murmur , no gallop  , no rub, nl s1 s2                           - JVD- none , edema- none, stasis changes- none, varices- none           Lung- clear to P&A, wheeze- none, cough- none , dullness-none, rub- none           Chest wall-  Abd- Br/ Gen/ Rectal- Not done, not indicated Extrem- cyanosis- none, clubbing, none, atrophy- none, strength- nl Neuro- grossly intact to observation

## 2011-09-05 ENCOUNTER — Encounter: Payer: Self-pay | Admitting: Internal Medicine

## 2011-09-05 NOTE — Assessment & Plan Note (Signed)
He now has the documentation needed for CPAP management by [facility]. We discussed CPAP use and goals. He will need f/u with oximetry once established on CPAP and using it regularly.

## 2011-10-09 ENCOUNTER — Emergency Department (HOSPITAL_COMMUNITY)
Admission: EM | Admit: 2011-10-09 | Discharge: 2011-10-09 | Disposition: A | Discharge status: Home / Self Care | Payer: Medicare Other | Attending: Emergency Medicine | Admitting: Emergency Medicine

## 2011-10-09 ENCOUNTER — Emergency Department (HOSPITAL_COMMUNITY): Payer: Medicare Other

## 2011-10-09 ENCOUNTER — Encounter (HOSPITAL_COMMUNITY): Payer: Self-pay | Admitting: Emergency Medicine

## 2011-10-09 DIAGNOSIS — M5126 Other intervertebral disc displacement, lumbar region: Secondary | ICD-10-CM | POA: Insufficient documentation

## 2011-10-09 DIAGNOSIS — I10 Essential (primary) hypertension: Secondary | ICD-10-CM | POA: Insufficient documentation

## 2011-10-09 DIAGNOSIS — Z87898 Personal history of other specified conditions: Secondary | ICD-10-CM | POA: Insufficient documentation

## 2011-10-09 DIAGNOSIS — M545 Low back pain, unspecified: Secondary | ICD-10-CM | POA: Insufficient documentation

## 2011-10-09 DIAGNOSIS — M5431 Sciatica, right side: Secondary | ICD-10-CM

## 2011-10-09 DIAGNOSIS — M5430 Sciatica, unspecified side: Secondary | ICD-10-CM | POA: Insufficient documentation

## 2011-10-09 DIAGNOSIS — K219 Gastro-esophageal reflux disease without esophagitis: Secondary | ICD-10-CM | POA: Insufficient documentation

## 2011-10-09 DIAGNOSIS — M47817 Spondylosis without myelopathy or radiculopathy, lumbosacral region: Secondary | ICD-10-CM | POA: Insufficient documentation

## 2011-10-09 DIAGNOSIS — Z87891 Personal history of nicotine dependence: Secondary | ICD-10-CM | POA: Insufficient documentation

## 2011-10-09 DIAGNOSIS — R109 Unspecified abdominal pain: Secondary | ICD-10-CM | POA: Insufficient documentation

## 2011-10-09 MED ORDER — OXYCODONE-ACETAMINOPHEN 5-325 MG PO TABS
2.0000 | ORAL_TABLET | Freq: Once | ORAL | Status: AC
  Administered 2011-10-09: 2 via ORAL
  Filled 2011-10-09: qty 2

## 2011-10-09 MED ORDER — ORPHENADRINE CITRATE ER 100 MG PO TB12: 100.0000 mg | ORAL_TABLET | Freq: Two times a day (BID) | ORAL | Status: DC

## 2011-10-09 MED ORDER — DICLOFENAC SODIUM 75 MG PO TBEC: 75.0000 mg | DELAYED_RELEASE_TABLET | Freq: Two times a day (BID) | ORAL | Status: DC

## 2011-10-09 MED ORDER — OXYCODONE-ACETAMINOPHEN 7.5-325 MG PO TABS: 1.0000 | ORAL_TABLET | ORAL | Status: DC | PRN

## 2011-10-09 NOTE — ED Provider Notes (Signed)
History     CSN: [id number]  Arrival date & time 10/09/11  4098   First MD Initiated Contact with Patient 10/09/11 0531      Chief Complaint  Patient presents with  . Back Pain    (Consider location/radiation/quality/duration/timing/severity/associated sxs/prior treatment) Patient is a 76 y.o. male presenting with back pain. The history is provided by the patient.  Back Pain   He has had pain in the right side of his back which radiates down his right leg for "quite some time". Over the last 3 days, pain has gotten worse. It is worse if he stands but nothing makes it better. He denies weakness, numbness, tingling and. He denies bowel or bladder dysfunction other than chronic constipation. He has not tried to do anything for it other than to take painkillers. Of note, he has a history of multiple myeloma, but he says he has not had any bone problems or pathologic fractures are related to it. His pain tonight was severe and he rated at 10 out of 10 at its worst. He route came by ambulance and was given hydromorphone 2 mg and states she has been pain-free since getting that. He has a prescription for Percocet but had run out.  Past Medical History  Diagnosis Date  . GERD (gastroesophageal reflux disease)   . Hypertension   . BPH (benign prostatic hyperplasia)   . Lumbar herniated disc L4-5  . Hyperlipidemia   . Atypical angina     june 2004  . Sleep apnea     History reviewed. No pertinent past surgical history.  Family History  Problem Relation Age of Onset  . Adopted: Yes    History  Substance Use Topics  . Smoking status: Former Smoker    Types: Cigarettes  . Smokeless tobacco: Not on file  . Alcohol Use: No      Review of Systems  Musculoskeletal: Positive for back pain.  All other systems reviewed and are negative.    Allergies  Penicillins  Home Medications   Current Outpatient Rx  Name Route Sig Dispense Refill  . AMLODIPINE BESYLATE 2.5 MG PO TABS  Oral Take 1 tablet by mouth daily.    . ASPIRIN 325 MG PO TABS Oral Take 325 mg by mouth daily.      . ATORVASTATIN CALCIUM 10 MG PO TABS Oral Take 1 tablet by mouth Daily.    Marland Kitchen BYSTOLIC 5 MG PO TABS Oral Take 1 tablet by mouth daily.    Marland Kitchen DICLOFENAC SODIUM 75 MG PO TBEC Oral Take 1 tablet by mouth Twice daily.    Marland Kitchen FINASTERIDE 5 MG PO TABS Oral Take 5 mg by mouth daily.      Marland Kitchen OMEPRAZOLE 20 MG PO CPDR Oral Take 1 tablet by mouth daily.    Marland Kitchen TAMSULOSIN HCL 0.4 MG PO CAPS Oral Take 1 tablet by mouth daily.      BP 165/86  Pulse 56  Temp(Src) 97.8 F (36.6 C) (Oral)  Resp 18  Wt 240 lb (108.863 kg)  SpO2 94%  Physical Exam  Nursing note and vitals reviewed.  76 year old male who is resting comfortably and in no acute distress. Vital signs are significant for moderate hypertension with blood pressure 165/86. Oxygen saturation is 94% which is normal. Head is normocephalic and atraumatic. PERRLA, EOMI although gaze is slightly disconjugate. Arcus senilis present. Oropharynx is clear. Neck is nontender and supple without adenopathy or JVD. Lungs are clear without rales, wheezes, rhonchi. Back has no tenderness  along the midline but there is mild tenderness along the right iliac crest. Straight leg raise is negative. Heart has regular rate and rhythm without murmur. There is no chest wall tenderness. Abdomen is soft, flat, nontender without masses or hepatosplenomegaly. Extremities have full range of motion, no cyanosis or edema. Skin is warm and dry without rash. Neurologic: Mental status is normal, cranial nerves are intact, there no focal motor or sensory deficits. A careful sensory and motor exam of the lower extremities was completely normal.  ED Course  Procedures (including critical care time)  Results for orders placed in visit on 07/23/11  URINALYSIS, MICROSCOPIC - [facility]      Component Value Range   Glucose Negative  Negative (g/dL)   Bilirubin (Urine) Negative  Negative    Ketones  Negative  Negative (mg/dL)   Specific Gravity, Urine 1.030  1.003 - 1.035    Blood Negative  Negative    pH 6.0  4.6 - 8.0    Protein < 30  Negative- <30 (mg/dL)   Nitrite Negative  Negative    Leukocyte Esterase Negative  Negative    RBC count Negative  0 - 2    WBC, UA 0-2  0 - 2    Bacteria, UA Few  Negative- Trace    Casts Hyaline  Negative    Mucus, UA Large  Negative- Small    Ct Abdomen Pelvis Wo Contrast  10/09/2011  *RADIOLOGY REPORT*  Clinical Data: Right flank pain.  CT ABDOMEN AND PELVIS WITHOUT CONTRAST  Technique:  Multidetector CT imaging of the abdomen and pelvis was performed following the standard protocol without intravenous contrast.  Comparison: Ultrasound renal 03/06/2010  Findings: Atelectasis or fibrosis in the lung bases.  Tiny punctate stone in the lower pole right kidney.  No other renal, ureteral, or bladder stones are visualized.  Additional calcifications in the pelvis are consistent with phleboliths.  No pyelocaliectasis or ureterectasis.  In the posterior mid pole of the right kidney, there is a hyperdense circumscribed lesion measuring about 2.1 cm diameter.  This corresponds to the location of a cyst on the previous ultrasound and likely represents an hemorrhagic cyst.  Hypodense mass in the lateral segment left lobe of the liver measuring about 3.3 cm diameter.  This lesion is demonstrated on previous CT chest from 07/16/2010 and has the appearance on that study of a cavernous hemangioma.  Small calcification in the spleen.  This may represent a granuloma.  Gallbladder and pancreas are unremarkable.  Small nodule in the right adrenal gland measuring 1.3 cm diameter.  This measures fat attenuation on Hounsfield unit measurements consistent with an adenoma.  The stomach, small bowel, and colon are decompressed without evidence of distension, obstruction, or inflammatory change.  Small amount of fat in the emboli this.  No free air or free fluid in the abdomen.   Calcification of the abdominal aorta without aneurysm.  Pelvis:  Bladder wall is not thickened.  Calcification in the prostate gland.  No free or loculated pelvic fluid collections. The appendix is normal.  No inflammatory changes in the sigmoid colon.  Degenerative changes in the lumbar spine and hips.  No destructive bone lesions appreciated.  IMPRESSION: Punctate nonobstructing intrarenal calculus on the right.  No ureteral stone or obstruction.  Hyperdense circumscribed lesion in the right kidney corresponding to a cyst on prior ultrasound and likely representing a hemorrhagic cyst.  Cavernous hemangioma demonstrated in the lateral segment left lobe of liver, corresponding to previous CT chest.  Degenerative changes in the lumbar spine.  Original Report Authenticated By: Marlon Pel, M.D.    Workup fails to show evidence of myeloma causing his pain. He does have significant arthritic changes at L3-L4. I've explained this to the patient. He had been taking Percocet 5-325 but had not been getting relief. He'll be given a prescription for Percocet 7.5-325. He had stopped taking diclofenac and he is given a new prescription to start that and also orphenadrine. He is to followup with his PCP.  1. Sciatica of right side       MDM  Right lower back and leg pain most consistent with sciatica. With his history of multiple myeloma, CT scan is ordered to evaluate for possible myeloma involvement of vertebrae and or ileum.        Dione Booze, MD 10/09/11 2337

## 2011-10-09 NOTE — Discharge Instructions (Signed)
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 This would affect the nerve that comes out at L3. Talk with your doctor about starting physical therapy. If symptoms are not improving, or if they get worse, you may need need to have an MRI scan to see if this is something that would be amenable to treatment by surgery.  Sciatica Sciatica is a weakness and/or changes in sensation (tingling, jolts, hot and cold, numbness) along the path the sciatic nerve travels. Irritation or damage to lumbar nerve roots is often also referred to as lumbar radiculopathy.  Lumbar radiculopathy (Sciatica) is the most common form of this problem. Radiculopathy can occur in any of the nerves coming out of the spinal cord. The problems caused depend on which nerves are involved. The sciatic nerve is the large nerve supplying the branches of nerves going from the hip to the toes. It often causes a numbness or weakness in the skin and/or muscles that the sciatic nerve serves. It also may cause symptoms (problems) of pain, burning, tingling, or electric shock-like feelings in the path of this nerve. This usually comes from injury to the fibers that make up the sciatic nerve. Some of these symptoms are low back pain and/or unpleasant feelings in the following areas:  From the mid-buttock down the back of the leg to the back of the knee.   And/or the outside of the calf and top of the foot.   And/or behind the inner ankle to the sole of the foot.  CAUSES   Herniated or slipped disc. Discs are the little cushions between the bones in the back.   Pressure by the piriformis muscle in the buttock on the sciatic nerve (Piriformis Syndrome).   Misalignment of the bones in the lower back and buttocks (Sacroiliac Joint Derangement).   Narrowing of the spinal canal that puts pressure on or pinches the fibers that make up the sciatic nerve.   A slipped vertebra that is out of line with those  above or beneath it.   Abnormality of the nervous system itself so that nerve fibers do not transmit signals properly, especially to feet and calves (neuropathy).   Tumor (this is rare).  Your caregiver can usually determine the cause of your sciatica and begin the treatment most likely to help you. TREATMENT  Taking over-the-counter painkillers, physical therapy, rest, exercise, spinal manipulation, and injections of anesthetics and/or steroids may be used. Surgery, acupuncture, and Yoga can also be effective. Mind over matter techniques, mental imagery, and changing factors such as your bed, chair, desk height, posture, and activities are other treatments that may be helpful. You and your caregiver can help determine what is best for you. With proper diagnosis, the cause of most sciatica can be identified and removed. Communication and cooperation between your caregiver and you is essential. If you are not successful immediately, do not be discouraged. With time, a proper treatment can be found that will make you comfortable. HOME CARE INSTRUCTIONS   If the pain is coming from a problem in the back, applying ice to that area for 15 to 20 minutes, 3 to 4 times per day while awake, may be helpful. Put the ice in a plastic bag. Place a towel between the bag of ice and your skin.   You may exercise or perform your usual activities if these do not aggravate your pain, or as suggested by your caregiver.   Only take over-the-counter or prescription medicines  for pain, discomfort, or fever as directed by your caregiver.   If your caregiver has given you a follow-up appointment, it is very important to keep that appointment. Not keeping the appointment could result in a chronic or permanent injury, pain, and disability. If there is any problem keeping the appointment, you must call back to this facility for assistance.  SEEK IMMEDIATE MEDICAL CARE IF:   You experience loss of control of bowel or bladder.    You have increasing weakness in the trunk, buttocks, or legs.   There is numbness in any areas from the hip down to the toes.   You have difficulty walking or keeping your balance.   You have any of the above, with fever or forceful vomiting.  Document Released: 06/22/2001 Document Revised: 06/17/2011 Document Reviewed: 02/09/2008 [facility] Patient Information 2012 [place], [place].  Orphenadrine tablets What is this medicine? ORPHENADRINE (or FEN a dreen) helps to relieve pain and stiffness in muscles and can treat muscle spasms. This medicine may be used for other purposes; ask your health care provider or pharmacist if you have questions. What should I tell my health care provider before I take this medicine? They need to know if you have any of these conditions: -glaucoma -heart disease -kidney disease -myasthenia gravis -peptic ulcer disease -prostate disease -stomach problems -an unusual or allergic reaction to orphenadrine, other medicines, foods, lactose, dyes, or preservatives -pregnant or trying to get pregnant -breast-feeding How should I use this medicine? Take this medicine by mouth with a full glass of water. Follow the directions on the prescription label. Take your medicine at regular intervals. Do not take your medicine more often than directed. Do not take more than you are told to take. Talk to your pediatrician regarding the use of this medicine in children. Special care may be needed. Patients over 73 years old may have a stronger reaction and need a smaller dose. Overdosage: If you think you have taken too much of this medicine contact a poison control center or emergency room at once. NOTE: This medicine is only for you. Do not share this medicine with others. What if I miss a dose? If you miss a dose, take it as soon as you can. If it is almost time for your next dose, take only that dose. Do not take double or extra doses. What may interact with this  medicine? -alcohol -antihistamines -barbiturates, like phenobarbital -benzodiazepines -cyclobenzaprine -medicines for pain -phenothiazines like chlorpromazine, mesoridazine, prochlorperazine, thioridazine This list may not describe all possible interactions. Give your health care provider a list of all the medicines, herbs, non-prescription drugs, or dietary supplements you use. Also tell them if you smoke, drink alcohol, or use illegal drugs. Some items may interact with your medicine. What should I watch for while using this medicine? Your mouth may get dry. Chewing sugarless gum or sucking hard candy, and drinking plenty of water may help. Contact your doctor if the problem does not go away or is severe. This medicine may cause dry eyes and blurred vision. If you wear contact lenses you may feel some discomfort. Lubricating drops may help. See your eye doctor if the problem does not go away or is severe. You may get drowsy or dizzy. Do not drive, use machinery, or do anything that needs mental alertness until you know how this medicine affects you. Do not stand or sit up quickly, especially if you are an older patient. This reduces the risk of dizzy or fainting spells. Alcohol  may interfere with the effect of this medicine. Avoid alcoholic drinks. What side effects may I notice from receiving this medicine? Side effects that you should report to your doctor or health care professional as soon as possible: -allergic reactions like skin rash, itching or hives, swelling of the face, lips, or tongue -changes in vision -difficulty breathing -fast heartbeat or palpitations -hallucinations -light headedness, fainting spells -vomiting Side effects that usually do not require medical attention (report to your doctor or health care professional if they continue or are bothersome): -dizziness -drowsiness -headache -nausea This list may not describe all possible side effects. Call your doctor for  medical advice about side effects. You may report side effects to FDA at 1-800-FDA-1088. Where should I keep my medicine? Keep out of the reach of children. Store at room temperature between 15 and 30 degrees C (59 and 86 degrees F). Protect from light. Keep container tightly closed. Throw away any unused medicine after the expiration date. NOTE: This sheet is a summary. It may not cover all possible information. If you have questions about this medicine, talk to your doctor, pharmacist, or health care provider.  2012, [employer] Standard. (01/23/2008 5:19:12 PM)  Diclofenac immediate-release tablets What is this medicine? DICLOFENAC (dye KLOE fen ak) is a non-steroidal anti-inflammatory drug (NSAID). It is used to reduce swelling and to treat pain. It may be used to treat osteoarthritis, rheumatoid arthritis, mild to moderate pain, and painful monthly periods. This medicine may be used for other purposes; ask your health care provider or pharmacist if you have questions. What should I tell my health care provider before I take this medicine? They need to know if you have any of these conditions: -asthma, especially aspirin sensitive asthma -coronary artery bypass graft (CABG) surgery within the past 2 weeks -drink more than 3 alcohol containing drinks a day -heart disease or circulation problems like heart failure or leg edema (fluid retention) -high blood pressure -kidney disease -liver disease -stomach bleeding or ulcers -an unusual or allergic reaction to diclofenac, aspirin, other NSAIDs, other medicines, foods, dyes, or preservatives -pregnant or trying to get pregnant -breast-feeding How should I use this medicine? Take this medicine by mouth with food and with a full glass of water. Follow the directions on the prescription label. Take your medicine at regular intervals. Do not take your medicine more often than directed. Long-term, continuous use may increase the risk of heart  attack or stroke. A special MedGuide will be given to you by the pharmacist with each prescription and refill. Be sure to read this information carefully each time. Talk to your pediatrician regarding the use of this medicine in children. Special care may be needed. Elderly patients over 9 years old may have a stronger reaction and need a smaller dose. Overdosage: If you think you have taken too much of this medicine contact a poison control center or emergency room at once. NOTE: This medicine is only for you. Do not share this medicine with others. What if I miss a dose? If you miss a dose, take it as soon as you can. If it is almost time for your next dose, take only that dose. Do not take double or extra doses. What may interact with this medicine? Do not take this medicine with any of the following medications: -cidofovir -ketorolac -methotrexate This medicine may also interact with the following medications: -alcohol -aspirin and aspirin-like medicines -cyclosporine -diuretics -lithium -medicines for blood pressure -medicines for osteoporosis -medicines that affect platelets -medicines  that treat or prevent blood clots like warfarin -NSAIDs, medicines for pain and inflammation, like ibuprofen or naproxen -pemetrexed -steroid medicines like prednisone or cortisone This list may not describe all possible interactions. Give your health care provider a list of all the medicines, herbs, non-prescription drugs, or dietary supplements you use. Also tell them if you smoke, drink alcohol, or use illegal drugs. Some items may interact with your medicine. What should I watch for while using this medicine? Tell your doctor or health care professional if your pain does not get better. Talk to your doctor before taking another medicine for pain. Do not treat yourself. This medicine does not prevent heart attack or stroke. In fact, this medicine may increase the chance of a heart attack or  stroke. The chance may increase with longer use of this medicine and in people who have heart disease. If you take aspirin to prevent heart attack or stroke, talk with your doctor or health care professional. Do not take medicines such as ibuprofen and naproxen with this medicine. Side effects such as stomach upset, nausea, or ulcers may be more likely to occur. Many medicines available without a prescription should not be taken with this medicine. This medicine can cause ulcers and bleeding in the stomach and intestines at any time during treatment. Do not smoke cigarettes or drink alcohol. These increase irritation to your stomach and can make it more susceptible to damage from this medicine. Ulcers and bleeding can happen without warning symptoms and can cause death. You may get drowsy or dizzy. Do not drive, use machinery, or do anything that needs mental alertness until you know how this medicine affects you. Do not stand or sit up quickly, especially if you are an older patient. This reduces the risk of dizzy or fainting spells. This medicine can cause you to bleed more easily. Try to avoid damage to your teeth and gums when you brush or floss your teeth. What side effects may I notice from receiving this medicine? Side effects that you should report to your doctor or health care professional as soon as possible: -allergic reactions like skin rash, itching or hives, swelling of the face, lips, or tongue -black or bloody stools, blood in the urine or vomit -blurred vision -chest pain -difficulty breathing or wheezing -nausea or vomiting -rash or fever -redness, blistering, peeling or loosening of the skin, including inside the mouth -slurred speech or weakness on one side of the body -unexplained weight gain or swelling -unusually weak or tired -yellowing of eyes or skin Side effects that usually do not require medical attention (report to your doctor or health care professional if they  continue or are bothersome): -constipation -diarrhea -dizziness -headache -heartburn This list may not describe all possible side effects. Call your doctor for medical advice about side effects. You may report side effects to FDA at 1-800-FDA-1088. Where should I keep my medicine? Keep out of the reach of children. Store at room temperature below 30 degrees C (86 degrees F). Protect from moisture. Keep container tightly closed. Throw away any unused medicine after the expiration date. NOTE: This sheet is a summary. It may not cover all possible information. If you have questions about this medicine, talk to your doctor, pharmacist, or health care provider.  2012, [employer] Standard. (11/15/2008 2:28:04 PM)  Acetaminophen; Oxycodone tablets What is this medicine? ACETAMINOPHEN; OXYCODONE (a set a MEE noe fen; ox i KOE done) is a pain reliever. It is used to treat mild to moderate  pain. This medicine may be used for other purposes; ask your health care provider or pharmacist if you have questions. What should I tell my health care provider before I take this medicine? They need to know if you have any of these conditions: -brain tumor -Crohn's disease, inflammatory bowel disease, or ulcerative colitis -drink more than 3 alcohol containing drinks per day -drug abuse or addiction -head injury -heart or circulation problems -kidney disease or problems going to the bathroom -liver disease -lung disease, asthma, or breathing problems -an unusual or allergic reaction to acetaminophen, oxycodone, other opioid analgesics, other medicines, foods, dyes, or preservatives -pregnant or trying to get pregnant -breast-feeding How should I use this medicine? Take this medicine by mouth with a full glass of water. Follow the directions on the prescription label. Take your medicine at regular intervals. Do not take your medicine more often than directed. Talk to your pediatrician regarding the use of  this medicine in children. Special care may be needed. Patients over 34 years old may have a stronger reaction and need a smaller dose. Overdosage: If you think you have taken too much of this medicine contact a poison control center or emergency room at once. NOTE: This medicine is only for you. Do not share this medicine with others. What if I miss a dose? If you miss a dose, take it as soon as you can. If it is almost time for your next dose, take only that dose. Do not take double or extra doses. What may interact with this medicine? -alcohol or medicines that contain alcohol -antihistamines -barbiturates like amobarbital, butalbital, butabarbital, methohexital, pentobarbital, phenobarbital, thiopental, and secobarbital -benztropine -drugs for bladder problems like solifenacin, trospium, oxybutynin, tolterodine, hyoscyamine, and methscopolamine -drugs for breathing problems like ipratropium and tiotropium -drugs for certain stomach or intestine problems like propantheline, homatropine methylbromide, glycopyrrolate, atropine, belladonna, and dicyclomine -general anesthetics like etomidate, ketamine, nitrous oxide, propofol, desflurane, enflurane, halothane, isoflurane, and sevoflurane -medicines for depression, anxiety, or psychotic disturbances -medicines for pain like codeine, morphine, pentazocine, buprenorphine, butorphanol, nalbuphine, tramadol, and propoxyphene -medicines for sleep -muscle relaxants -naltrexone -phenothiazines like perphenazine, thioridazine, chlorpromazine, mesoridazine, fluphenazine, prochlorperazine, promazine, and trifluoperazine -scopolamine -trihexyphenidyl This list may not describe all possible interactions. Give your health care provider a list of all the medicines, herbs, non-prescription drugs, or dietary supplements you use. Also tell them if you smoke, drink alcohol, or use illegal drugs. Some items may interact with your medicine. What should I watch  for while using this medicine? Tell your doctor or health care professional if your pain does not go away, if it gets worse, or if you have new or a different type of pain. You may develop tolerance to the medicine. Tolerance means that you will need a higher dose of the medication for pain relief. Tolerance is normal and is expected if you take this medicine for a long time. Do not suddenly stop taking your medicine because you may develop a severe reaction. Your body becomes used to the medicine. This does NOT mean you are addicted. Addiction is a behavior related to getting and using a drug for a nonmedical reason. If you have pain, you have a medical reason to take pain medicine. Your doctor will tell you how much medicine to take. If your doctor wants you to stop the medicine, the dose will be slowly lowered over time to avoid any side effects. You may get drowsy or dizzy. Do not drive, use machinery, or do anything that needs mental  alertness until you know how this medicine affects you. Do not stand or sit up quickly, especially if you are an older patient. This reduces the risk of dizzy or fainting spells. Alcohol may interfere with the effect of this medicine. Avoid alcoholic drinks. The medicine will cause constipation. Try to have a bowel movement at least every 2 to 3 days. If you do not have a bowel movement for 3 days, call your doctor or health care professional. Do not take Tylenol (acetaminophen) or medicines that have acetaminophen with this medicine. Too much acetaminophen can be very dangerous. Many nonprescription medicines contain acetaminophen. Always read the labels carefully to avoid taking more acetaminophen. What side effects may I notice from receiving this medicine? Side effects that you should report to your doctor or health care professional as soon as possible: -allergic reactions like skin rash, itching or hives, swelling of the face, lips, or tongue -breathing difficulties,  wheezing -confusion -light headedness or fainting spells -severe stomach pain -yellowing of the skin or the whites of the eyes Side effects that usually do not require medical attention (report to your doctor or health care professional if they continue or are bothersome): -dizziness -drowsiness -nausea -vomiting This list may not describe all possible side effects. Call your doctor for medical advice about side effects. You may report side effects to FDA at 1-800-FDA-1088. Where should I keep my medicine? Keep out of the reach of children. This medicine can be abused. Keep your medicine in a safe place to protect it from theft. Do not share this medicine with anyone. Selling or giving away this medicine is dangerous and against the law. Store at room temperature between 20 and 25 degrees C (68 and 77 degrees F). Keep container tightly closed. Protect from light. Flush any unused medicines down the toilet. Do not use the medicine after the expiration date. NOTE: This sheet is a summary. It may not cover all possible information. If you have questions about this medicine, talk to your doctor, pharmacist, or health care provider.  2012, [employer] Standard. (05/27/2008 10:01:21 AM)

## 2011-10-09 NOTE — ED Notes (Signed)
To ED from home via EMS with c/o right sided back pain, no trauma reported, 20g R hand, EMS gave 2mg  Hydromorphone, NAD

## 2011-10-14 ENCOUNTER — Ambulatory Visit: Payer: Medicare Other | Admitting: Internal Medicine

## 2011-10-18 ENCOUNTER — Other Ambulatory Visit: Payer: Self-pay | Admitting: Family Medicine

## 2011-10-18 DIAGNOSIS — M79604 Pain in right leg: Secondary | ICD-10-CM

## 2011-10-18 DIAGNOSIS — M549 Dorsalgia, unspecified: Secondary | ICD-10-CM

## 2011-10-19 ENCOUNTER — Ambulatory Visit (HOSPITAL_COMMUNITY)
Admission: RE | Admit: 2011-10-19 | Discharge: 2011-10-19 | Disposition: A | Discharge status: Home / Self Care | Payer: Medicare Other | Source: Ambulatory Visit | Attending: Family Medicine | Admitting: Family Medicine

## 2011-10-19 ENCOUNTER — Ambulatory Visit: Payer: Medicare Other | Admitting: Internal Medicine

## 2011-10-19 ENCOUNTER — Telehealth: Payer: Self-pay | Admitting: *Deleted

## 2011-10-19 DIAGNOSIS — M5137 Other intervertebral disc degeneration, lumbosacral region: Secondary | ICD-10-CM | POA: Insufficient documentation

## 2011-10-19 DIAGNOSIS — M545 Low back pain, unspecified: Secondary | ICD-10-CM | POA: Insufficient documentation

## 2011-10-19 DIAGNOSIS — M51379 Other intervertebral disc degeneration, lumbosacral region without mention of lumbar back pain or lower extremity pain: Secondary | ICD-10-CM | POA: Insufficient documentation

## 2011-10-19 DIAGNOSIS — M79604 Pain in right leg: Secondary | ICD-10-CM

## 2011-10-19 DIAGNOSIS — M549 Dorsalgia, unspecified: Secondary | ICD-10-CM

## 2011-10-19 DIAGNOSIS — M79609 Pain in unspecified limb: Secondary | ICD-10-CM | POA: Insufficient documentation

## 2011-10-19 DIAGNOSIS — M5380 Other specified dorsopathies, site unspecified: Secondary | ICD-10-CM | POA: Insufficient documentation

## 2011-10-19 NOTE — Telephone Encounter (Signed)
Wife left VM saying "I need to bring [name] in tomorrow morning to be seen. His myeloma come up. Please call me as soon as possible". Noted that he is schedule for MRI at 7 pm tonight. Sees Dr. Truett Perna on 10/22/11. Per Dr. Nell Range and follow up on MRI results first.

## 2011-10-20 ENCOUNTER — Telehealth: Payer: Self-pay | Admitting: *Deleted

## 2011-10-20 ENCOUNTER — Encounter: Payer: Self-pay | Admitting: Nurse Practitioner

## 2011-10-20 ENCOUNTER — Ambulatory Visit (HOSPITAL_BASED_OUTPATIENT_CLINIC_OR_DEPARTMENT_OTHER): Payer: Medicare Other | Admitting: Lab

## 2011-10-20 ENCOUNTER — Telehealth: Payer: Self-pay | Admitting: Oncology

## 2011-10-20 ENCOUNTER — Ambulatory Visit (HOSPITAL_BASED_OUTPATIENT_CLINIC_OR_DEPARTMENT_OTHER): Payer: Medicare Other | Admitting: Nurse Practitioner

## 2011-10-20 DIAGNOSIS — M545 Low back pain: Secondary | ICD-10-CM

## 2011-10-20 DIAGNOSIS — R799 Abnormal finding of blood chemistry, unspecified: Secondary | ICD-10-CM

## 2011-10-20 DIAGNOSIS — C9000 Multiple myeloma not having achieved remission: Secondary | ICD-10-CM

## 2011-10-20 DIAGNOSIS — M549 Dorsalgia, unspecified: Secondary | ICD-10-CM

## 2011-10-20 NOTE — Telephone Encounter (Signed)
Call from pt's wife reporting pt's "myeloma is up". They are on their way to the office. They were told by the PCP to come in today. Received fax from Dr. Nash Dimmer office with elevated protein. Requested they let me get pt scheduled so they would not have to be worked in and have a long wait. Pt and wife declined. She became upset stating that pt's meloma is spreading and he needs to be seen today. Providers made aware. Pt added to NP schedule.

## 2011-10-20 NOTE — Telephone Encounter (Signed)
gve the pt's daughter the April 2013 appt calendar. Per Lonna Cobb the pa dr Truett Perna has already spoken with dr Dutch Quint and their office will be calling the pt directly with an appt. They are aware

## 2011-10-20 NOTE — Progress Notes (Signed)
OFFICE PROGRESS NOTE  Interval history:  [name]. [name] is a 76 year old man with multiple myeloma. He has been maintained off of specific therapy for myeloma since September 2010. He presents to the office today for an unscheduled visit for evaluation of back pain and elevation of serum total protein on recent labs.  Labs done through [facility] on 10/18/2011 returned with a total protein of 11.2. In addition, creatinine was 1.01, calcium 9.3, albumin 3.4, hemoglobin 14.7, white count 8.3, platelet count 214,000.  MRI of the [name] spine on 10/19/2011 showed [name] spondylosis and degenerative disc disease with considerable impingement at L2-3, L3-4 and L4-5. The most severe areas of impingement included the right L3 nerve roots due to a large disc fragment extending caudad from the L2-3 level to the right lateral recess, as well as the prominent central and right foraminal stenosis at L3-4 and the prominent central stenosis at L4-5.   [name]. [name] reports onset of low back and right hip pain radiating to the right leg approximately one month ago. He denies known injury. The right leg is weak. He denies numbness. He is taking Percocet 7.5/325 one tablet every 6 hours as needed. He also takes a muscle relaxant twice a day. No bowel or bladder dysfunction.   Objective: Blood pressure 98/60, pulse 54, temperature 96.9 F (36.1 C), temperature source Oral, height 6' (1.829 m), weight 224 lb 1.6 oz (101.651 kg).  Oropharynx is without thrush. Lungs are clear. Regular cardiac rhythm. Abdomen soft and nontender. No organomegaly. Extremities are without edema. Proximal right leg weakness. He has slight weakness at the right hand. Right knee DTR 1+; left knee DTR 2+.   Lab Results: Lab Results  Component Value Date   WBC 5.1 07/23/2011   HGB 12.4* 07/23/2011   HCT 34.1* 07/23/2011   MCV 79.9 07/23/2011   PLT 210 07/23/2011    Chemistry:    Chemistry      Component Value Date/Time   NA 135 07/23/2011 0943   K 4.2 07/23/2011 0943   CL 104 07/23/2011 0943   CO2 26 07/23/2011 0943   BUN 16 07/23/2011 0943   CREATININE 1.14 07/23/2011 0943      Component Value Date/Time   CALCIUM 8.7 07/23/2011 0943   ALKPHOS 48 07/23/2011 0943   AST 33 07/23/2011 0943   ALT 19 07/23/2011 0943   BILITOT 0.4 07/23/2011 0943       Studies/Results: Ct Abdomen Pelvis Wo [name]  10/09/2011  *RADIOLOGY REPORT*  Clinical Data: Right flank pain.  CT ABDOMEN AND PELVIS WITHOUT [name]  Technique:  Multidetector CT imaging of the abdomen and pelvis was performed following the standard protocol without intravenous [name].  Comparison: Ultrasound renal 03/06/2010  Findings: Atelectasis or fibrosis in the lung bases.  Tiny punctate stone in the lower pole right kidney.  No other renal, ureteral, or bladder stones are visualized.  Additional calcifications in the pelvis are consistent with phleboliths.  No pyelocaliectasis or ureterectasis.  In the posterior mid pole of the right kidney, there is a hyperdense circumscribed lesion measuring about 2.1 cm diameter.  This corresponds to the location of a cyst on the previous ultrasound and likely represents an hemorrhagic cyst.  Hypodense mass in the lateral segment left lobe of the liver measuring about 3.3 cm diameter.  This lesion is demonstrated on previous CT chest from 07/16/2010 and has the appearance on that study of a cavernous hemangioma.  Small calcification in the spleen.  This may represent a  granuloma.  Gallbladder and pancreas are unremarkable.  Small nodule in the right adrenal gland measuring 1.3 cm diameter.  This measures fat attenuation on Hounsfield unit measurements consistent with an adenoma.  The stomach, small bowel, and colon are decompressed without evidence of distension, obstruction, or inflammatory change.  Small amount of fat in the emboli this.  No free air or free fluid in the abdomen.  Calcification of the abdominal aorta without  aneurysm.  Pelvis:  Bladder wall is not thickened.  Calcification in the prostate gland.  No free or loculated pelvic fluid collections. The appendix is normal.  No inflammatory changes in the sigmoid colon.  Degenerative changes in the [name] spine and hips.  No destructive bone lesions appreciated.  IMPRESSION: Punctate nonobstructing intrarenal calculus on the right.  No ureteral stone or obstruction.  Hyperdense circumscribed lesion in the right kidney corresponding to a cyst on prior ultrasound and likely representing a hemorrhagic cyst.  Cavernous hemangioma demonstrated in the lateral segment left lobe of liver, corresponding to previous CT chest.  Degenerative changes in the [name] spine.  Original Report Authenticated By: Marlon Pel, M.D.   [name] [name]  10/20/2011  *RADIOLOGY REPORT*  Clinical Data: Low back pain.  Pain and weakness in the right leg.  MRI [name] SPINE WITHOUT [name]  Technique:  Multiplanar and multiecho pulse sequences of the [name] spine were obtained without intravenous [name].  Comparison: 10/09/2011  Findings: The lowest full intervertebral disk space is labeled L5- S1.  If procedural intervention is to be performed, careful correlation with this numbering strategy is recommended.  The conus medullaris appears unremarkable.  Conus level:  L1.  There is 2 mm of degenerative posterior subluxation of L2-1 L3.  Small hemangioma is noted at the T12, L1, and L2 levels.  There is prominent loss of disc space at L3-4 and L4-5, with type 3 degenerative endplate findings at L3-4 and type 2 degenerative endplate findings of L4-5.  Vacuum disc phenomenon noted at both levels.  The pedicles in the mid to lower [name] spine appear mildly congenitally short.  Disc desiccation noted at L3-4 and L4-5, and to a lesser extent at L2-3.  We partially imaged a 2.3 cm lesion with low T2 and intermediate to high T1 signal posteriorly in the right mid kidney, as shown on recent  CT scan of 10/09/2011.  This is probably a complex cyst, although technically nonspecific due to partial characterization and lack of IV [name]. However, this lesion was then present back on studies through 2010.  Additional findings at individual levels are as follows:  T12-L1:  Unremarkable.  L1-2:  Minimal facet arthropathy.  No impingement.  L2-3:  Disc bulge noted with facet arthropathy and right greater than left facet effusions.  There is also abnormal disc material in the right lateral recess extending caudad from this level, probably representing a large disc fragment or disc extrusion, and significantly impinging on the right L3 nerve roots in the lateral recess.  This disc fragment is visible on image 70 of series 3. The AP diameter of the thecal sac is narrowed to 7 mm, compatible with moderate central stenosis.  There is annular tearing in the left neural for foramen and left lateral extraforaminal space, where the underlying disc bulge abuts the left L2 nerve.  L3-4:  Disc bulge, facet arthropathy, and ligament flavum redundancy noted. The AP diameter of the thecal sac is narrowed to 6 mm compatible with prominent central stenosis.  Right  foraminal disc protrusion noted with intervertebral and facet spurring causing prominent right foraminal stenosis.  Facets spurring causes moderate left foraminal stenosis.  There is mild right and borderline left subarticular lateral recess stenosis.  L4-5:  Disc osteophyte complex noted with left greater than right facet arthropathy and small right facet effusion. The AP diameter of the thecal sac is narrowed to 5 mm compatible with severe central stenosis.  There is mild left and borderline right foraminal stenosis as well as mild left subarticular lateral recess stenosis.  L5-S1:  No impingement.  IMPRESSION:  1.  [name] spondylosis and degenerative disc disease, with considerable impingement at L2-3, L3-4, and L4-5.  The most severe areas of impingement include  impingement on the right L3 nerve roots due to a large disc fragment extending caudad from the L2-3 level in the right lateral recess; as well as the prominent central and right foraminal stenosis at L3-4 and the prominent central stenosis at L4-5.  Other lesser degrees of impingement are noted as referenced above. 2.  Complex cystic lesion of the right mid kidney posteriorly is partially characterized on today's exam, and similar appearance to the recent CT scan from 10/09/2011.  Original Report Authenticated By: Dellia Cloud, M.D.    Medications: I have reviewed the patient's current medications.  Assessment/Plan:  1. Multiple myeloma, status post treatment with melphalan/prednisone/thalidomide beginning in November 2009.  The thalidomide was increased to 200 mg daily beginning 09/11/2008.  The serum M spike was slightly improved on 03/18/2009 and slightly increased on 05/09/2009.  He has been maintained off of specific therapy for multiple myeloma since September 2010.  The serum M spike was higher in June 2012. 2. Chronic "dizziness." 3. Chest x-ray 11/01/2008 with a patchy opacity at the right midlung suspicious for pneumonia.  He was treated with Avelox. 4. Low back pain with a radicular component.  An MRI on 10/07/2008 showed no involvement of the lumbosacral spine with myeloma.  The pain was felt to be related to degenerative disease involving the [name] spine and congenital spinal stenosis. 5. Bilateral neck pain, status post a CT 05/17/2008 with findings of spinal stenosis and osteoarthritis. 6. 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 7. Red cell microcytosis, likely related to hemoglobin C trait.  A ferritin level returned elevated and a stool Hemoccult was negative 01/23/2010.  He underwent a colonoscopy in 2009. 8. Elevated beta-2 microglobulin level. 9. History of hypertension. 10. History of a herniated disk at L4-L5 on MRI an  01/11/2002. 11. Hyperlipidemia. 12. Gastroesophageal reflux disease. 13. History of atypical angina. 14. History of rectal bleeding. 15. Superficial venous thrombosis of the greater saphenous vein on a Doppler 09/27/2008.  Negative for deep vein thrombosis.  Symptoms improved with aspirin. 16. Neck pain with numbness/tingling in the arms, hands, and low back with proximal right leg weakness.  An MRI of the cervical spine 05/30/2009 showed multilevel spondylosis with mild cord edema at C4-C5 and C5-C6.  He was noted to have an enhancing lesion of the cord at T3-T4 of unclear etiology.  In the [name] spine there was no change in the spondylosis at L3-L4 and L4-L5.  There was no involvement of the cervical or [name] spine with myeloma.  He is status post C4-C5, C5-C6, and C6-C7 anterior cervical diskectomy with fusion by Dr. Jordan Likes 08/01/2009. 17. History of mild thrombocytopenia. 18. Intermittent indeterminate-age deep vein thrombosis of the left lower extremity on a venous Doppler 08/15/2009.  There were also findings  consistent with superficial thrombosis involving the right lower extremity 08/15/2009.  Coumadin was discontinued in October 2011. 19. Type 2 diabetes. 20. History of hematuria, followed at [facility]. Urinalysis was negative for blood on 07/23/2011. 21. Radicular back pain with MRI of the [name] spine on 10/19/2011 showing nerve root impingement at L2-L3, L3-L4 and L4-L5 with the most severe at the right L3 nerve roots due to a large disc fragment.  22. Proximal right leg weakness on exam today. 23. Elevated total protein on labs done 10/18/2011.  Disposition-the radicular back pain [name]. [name] is experiencing does not appear to be due to multiple myeloma. Dr. Truett Perna has spoken with Dr. Jordan Likes. Dr. Jordan Likes will see [name] on 10/21/2011. [name] will begin a Medrol Dosepak. He will continue Percocet as needed. We are repeating a serum protein electrophoresis, serum IgG and serum light  chains today. [name] or return on 10/29/2011 to review the results.  Patient seen with Dr. Truett Perna.    Lonna Cobb ANP/GNP-BC

## 2011-10-22 ENCOUNTER — Other Ambulatory Visit: Payer: Medicare Other | Admitting: Lab

## 2011-10-22 ENCOUNTER — Ambulatory Visit: Payer: Medicare Other | Admitting: Nurse Practitioner

## 2011-10-22 LAB — KAPPA/LAMBDA LIGHT CHAINS
Kappa:Lambda Ratio: 1.31 (ref 0.26–1.65)
Lambda Free Lght Chn: 1.32 mg/dL (ref 0.57–2.63)

## 2011-10-22 LAB — PROTEIN ELECTROPHORESIS, SERUM
Albumin ELP: 36.5 % — ABNORMAL LOW (ref 55.8–66.1)
M-Spike, %: 4.62 g/dL
Total Protein, Serum Electrophoresis: 10.5 g/dL — ABNORMAL HIGH (ref 6.0–8.3)

## 2011-10-25 ENCOUNTER — Encounter (HOSPITAL_COMMUNITY): Payer: Self-pay | Admitting: Pharmacy Technician

## 2011-10-26 ENCOUNTER — Encounter: Payer: Self-pay | Admitting: Internal Medicine

## 2011-10-26 ENCOUNTER — Encounter (HOSPITAL_COMMUNITY)
Admission: RE | Admit: 2011-10-26 | Discharge: 2011-10-26 | Disposition: A | Discharge status: Home / Self Care | Payer: Medicare Other | Source: Ambulatory Visit | Attending: Neurosurgery | Admitting: Neurosurgery

## 2011-10-26 ENCOUNTER — Encounter (HOSPITAL_COMMUNITY): Payer: Self-pay

## 2011-10-26 LAB — BASIC METABOLIC PANEL
CO2: 24 mEq/L (ref 19–32)
Chloride: 96 mEq/L (ref 96–112)
Glucose, Bld: 95 mg/dL (ref 70–99)
Sodium: 127 mEq/L — ABNORMAL LOW (ref 135–145)

## 2011-10-26 LAB — DIFFERENTIAL
Basophils Absolute: 0 10*3/uL (ref 0.0–0.1)
Eosinophils Relative: 2 % (ref 0–5)
Lymphocytes Relative: 24 % (ref 12–46)
Monocytes Absolute: 0.5 10*3/uL (ref 0.1–1.0)
Monocytes Relative: 5 % (ref 3–12)
Neutro Abs: 5.8 10*3/uL (ref 1.7–7.7)

## 2011-10-26 LAB — CBC
HCT: 36.7 % — ABNORMAL LOW (ref 39.0–52.0)
Hemoglobin: 13.5 g/dL (ref 13.0–17.0)
MCHC: 36.8 g/dL — ABNORMAL HIGH (ref 30.0–36.0)
MCV: 79.8 fL (ref 78.0–100.0)
RDW: 15.5 % (ref 11.5–15.5)
WBC: 8.5 10*3/uL (ref 4.0–10.5)

## 2011-10-26 LAB — TYPE AND SCREEN

## 2011-10-26 LAB — SURGICAL PCR SCREEN: Staphylococcus aureus: POSITIVE — AB

## 2011-10-26 MED ORDER — CHLORHEXIDINE GLUCONATE 4 % EX LIQD: 1.0000 "application " | Freq: Once | CUTANEOUS | Status: DC

## 2011-10-26 NOTE — Pre-Procedure Instructions (Addendum)
20 [name]  10/26/2011   Your procedure is scheduled on:  November 01, 2011  Report to [facility] at 12:15 PM.  Call this number if you have problems the morning of surgery: [phone]   Remember:   Do not eat food:After Midnight.  May have clear liquids: up to 4 Hours before arrival.  Clear liquids include soda, tea, black coffee, apple or grape juice, broth.  Take these medicines the morning of surgery with A SIP OF WATER: NORVASC, BYSTOLIC,PRILOSEC  STOP ASPIRIN NOW   Do not wear jewelry, make-up or nail polish.  Do not wear lotions, powders, or perfumes. You may wear deodorant.  Do not shave 48 hours prior to surgery.  Do not bring valuables to the hospital.  Contacts, dentures or bridgework may not be worn into surgery.  Leave suitcase in the car. After surgery it may be brought to your room.  For patients admitted to the hospital, checkout time is 11:00 AM the day of discharge.   Patients discharged the day of surgery will not be allowed to drive home.  Name and phone number of your driver: NA  Special Instructions: CHG Shower Use Special Wash: 1/2 bottle night before surgery and 1/2 bottle morning of surgery.   Please read over the following fact sheets that you were given: Pain Booklet, MRSA Information and Surgical Site Infection Prevention

## 2011-10-26 NOTE — Progress Notes (Signed)
CONTACTED VANESSA AT MD OFFICE PT ALLERGIC TO PENICILLINS ANCEF ORDERED, GIVE VANCOMYCIN 500 IV TO OR

## 2011-10-28 NOTE — Consult Note (Addendum)
Anesthesia Chart Review:  Patient is a 76 year old male scheduled for a right L2-3 decompressive LL, microdiskectomy right L3-4 decompressive LL on 11/01/11. History includes multiple myeloma, anemia, HTN, HLD, GERD, BPH, former smoker, atypical angina in '04, superficial venous thrombosis, OSA.  He is s/p 3 level ACDF in 2011.  PCP is Dr. Modesto Charon at [facility].  His Oncologist is Dr. Truett Perna who referred patient to Dr. Jordan Likes for surgical intervention.    His Pulmonologist is Dr.Young.  He had a sleep study on 08/22/11 that showed moderate OSA with a AHI of 26.8/hr.  CPAP titrated to 9 CWP.  EKG on 03/16/11 showed NSR.  His last cardiac work up was in 2004 and included a stress and echo.  Echo showed EF 45-50%, mild diffuse LV hypokinesis, trivial MR/TR.  Stress then showed no pharmacologic stress induced ischemia, EF 49%.   CXR from 03/14/11 showed 1. Bronchitic changes.  2. No focal pulmonary abnormality.  Labs noted.  Na 127, Cr 1.53.  He will need a BMET repeated preoperatively.  H/H 13.5/36.7.  It appears he has an appointment with Dr. Truett Perna tomorrow.  Will see if labs are repeated at that time.  Addendum: 10/29/11 1420  Patient was seen by Dr.Sherrill this morning. According to his note, "There is a significant rise in the serum M spike, mild elevation of his creatinine, and he has a declining performance status. We plan to reinitiate treatment of multiple myeloma with Revlimid/Decadron when he has recovered from the planned surgery. I plan to see Mr. Parkerson while in the hospital next week."  No labs were repeated, so I will order a BMET for the day of surgery.  Yesterday I notified Erie Noe at Dr. Dalphine Handing' office of patient's Na and Cr results.

## 2011-10-29 ENCOUNTER — Telehealth: Payer: Self-pay | Admitting: Oncology

## 2011-10-29 ENCOUNTER — Ambulatory Visit (HOSPITAL_BASED_OUTPATIENT_CLINIC_OR_DEPARTMENT_OTHER): Payer: Medicare Other | Admitting: Lab

## 2011-10-29 ENCOUNTER — Ambulatory Visit (HOSPITAL_BASED_OUTPATIENT_CLINIC_OR_DEPARTMENT_OTHER): Payer: Medicare Other | Admitting: Oncology

## 2011-10-29 DIAGNOSIS — Z86718 Personal history of other venous thrombosis and embolism: Secondary | ICD-10-CM

## 2011-10-29 DIAGNOSIS — C9000 Multiple myeloma not having achieved remission: Secondary | ICD-10-CM

## 2011-10-29 DIAGNOSIS — M545 Low back pain: Secondary | ICD-10-CM

## 2011-10-29 DIAGNOSIS — D630 Anemia in neoplastic disease: Secondary | ICD-10-CM

## 2011-10-29 LAB — COMPREHENSIVE METABOLIC PANEL
ALT: 27 U/L (ref 0–53)
CO2: 24 mEq/L (ref 19–32)
Calcium: 10.5 mg/dL (ref 8.4–10.5)
Chloride: 99 mEq/L (ref 96–112)
Sodium: 129 mEq/L — ABNORMAL LOW (ref 135–145)
Total Protein: 10.3 g/dL — ABNORMAL HIGH (ref 6.0–8.3)

## 2011-10-29 MED ORDER — SORBITOL 70 % PO SOLN: 30.0000 mL | Freq: Two times a day (BID) | ORAL | Status: DC

## 2011-10-29 NOTE — Telephone Encounter (Signed)
appts made and printed for pt aom °

## 2011-10-29 NOTE — Progress Notes (Signed)
OFFICE PROGRESS NOTE   INTERVAL HISTORY:   He returns as scheduled. He continues to have pain at the right lower back and pain/weakness of the right leg. He saw Dr. Jordan Likes and is scheduled for back surgery on 11/01/2011.  [name] complains of constipation for the past 2 weeks. The constipation has not been relieved with milk of magnesia, Senokot, or prune juice. He denies difficulty with bladder control. He is taking oxycodone every 6 hours for back pain. He reports hoarseness. The steroid Dosepak did not help the back pain.  Objective:  Vital signs in last 24 hours:  Blood pressure 114/76, pulse 68, temperature 96.8 F (36 C), temperature source Oral, height 6' (1.829 m), weight 216 lb 3.2 oz (98.068 kg).    HEENT: Neck without mass. No thrush. Lymphatics: No cervical, supraclavicular, or axillary nodes Resp: Lungs clear bilaterally Cardio: Regular rate and rhythm GI: Nontender, no hepatosplenomegaly Vascular: No leg edema Neuro: 4/5 strength with flexion at the right hip and extension at the right knee. 5 over 5 strength with dorsi flexion of the right foot      Lab Results:  Lab Results  Component Value Date   WBC 8.5 10/26/2011   HGB 13.5 10/26/2011   HCT 36.7* 10/26/2011   MCV 79.8 10/26/2011   PLT 243 10/26/2011   IgG 5930 on 10/20/2011 Serum M spike 4.6 to on 10/20/2011.   Medications: I have reviewed the patient's current medications.  Assessment/Plan: 1. Multiple myeloma, status post treatment with melphalan/prednisone/thalidomide beginning in November 2009. The thalidomide was increased to 200 mg daily beginning 09/11/2008. The serum M spike was slightly improved on 03/18/2009 and slightly increased on 05/09/2009. He has been maintained off of specific therapy for multiple myeloma since September 2010. The serum M spike is now higher.  2. Chronic "dizziness." 3. Chest x-ray 11/01/2008 with a patchy opacity at the right midlung suspicious for pneumonia. He was  treated with Avelox. 4. Low back pain with a radicular component. An MRI on 10/07/2008 showed no involvement of the lumbosacral spine with myeloma. 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 5. Bilateral neck pain, status post a CT 05/17/2008 with findings of spinal stenosis and osteoarthritis. 6. 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 7. Red cell microcytosis, likely related to hemoglobin C trait. A ferritin level returned elevated and a stool Hemoccult was negative 01/23/2010. He underwent a colonoscopy in 2009. 8. Elevated beta-2 microglobulin level. 9. History of hypertension. 10. History of a herniated disk at L4-L5 on MRI an 01/11/2002. 11. Hyperlipidemia. 12. Gastroesophageal reflux disease. 13. History of atypical angina. 14. History of rectal bleeding. 15. Superficial venous thrombosis of the greater saphenous vein on a Doppler 09/27/2008. Negative for deep vein thrombosis. Symptoms improved with aspirin. 16. Neck pain with numbness/tingling in the arms, hands, and low back with proximal right leg weakness. An MRI of the cervical spine 05/30/2009 showed multilevel spondylosis with mild cord edema at C4-C5 and C5-C6. He was noted to have an enhancing lesion of the cord at T3-T4 of unclear etiology. In the lumbar spine there was no change in the spondylosis at L3-L4 and L4-L5. There was no involvement of the cervical or lumbar spine with myeloma. He is status post C4-C5, C5-C6, and C6-C7 anterior cervical diskectomy with fusion by Dr. Jordan Likes 08/01/2009. 17. History of mild thrombocytopenia. 18. Intermittent indeterminate-age deep vein thrombosis of the left lower extremity on a  venous Doppler 08/15/2009. There were also findings consistent with superficial thrombosis involving the right lower extremity 08/15/2009. Coumadin was discontinued in October 2011. 19. Type 2  diabetes. 20. History of hematuria, followed at [facility]. Urinalysis was negative for blood on 07/23/2011. 21. Radicular back pain with MRI of the lumbar spine on 10/19/2011 showing nerve root impingement at L2-L3, L3-L4 and L4-L5 with the most severe at the right L3 nerve roots due to a large disc fragment. Associated right leg weakness. He is scheduled for surgery 11/01/2011. 22. Constipation-likely related to narcotics, we adjusted the bowel regimen today   Disposition:  [name] has progressive multiple myeloma after being off of specific treatment for myeloma for the past one and one half years. He is currently symptomatic with back pain and right leg weakness related to degenerative changes and disc disease at the lumbar spine. He is scheduled for surgery on 11/01/2011.  There is a  significant rise in the serum M spike,  mild elevation of his creatinine, and he has a declining performance status. We plan to reinitiate treatment of multiple myeloma with Revlimid/Decadron when he has recovered from the planned surgery. I plan to see [name] while in the hospital next week. He will return for an office visit in approximately 3 weeks. He will try sorbitol and magnesium citrate for constipation. [name] will contact us if the constipation is not relieved within the next 2 days.   Thornton Papas, MD  10/29/2011  12:01 PM

## 2011-10-29 NOTE — Patient Instructions (Signed)
[facility] Discharge Instructions   You are constipated from a combination of narcotics and limited mobility: Take Sorbitol 30 cc by mouth twice daily for bowel movement (script) Take 1/2 bottle of Magnesium citrate today and repeat Saturday am if no BM. If still no BM, please Dr. Truett Perna on Sunday. Push po fluids  BELOW ARE SYMPTOMS THAT SHOULD BE REPORTED IMMEDIATELY:  *FEVER GREATER THAN 100.5 F  *CHILLS WITH OR WITHOUT FEVER  NAUSEA AND VOMITING THAT IS NOT CONTROLLED WITH YOUR NAUSEA MEDICATION  *UNUSUAL SHORTNESS OF BREATH  *UNUSUAL BRUISING OR BLEEDING  TENDERNESS IN MOUTH AND THROAT WITH OR WITHOUT PRESENCE OF ULCERS  *URINARY PROBLEMS  *BOWEL PROBLEMS  UNUSUAL RASH Items with * indicate a potential emergency and should be followed up as soon as possible.  The clinic phone number is [phone].       __________________________________________  _____________  __________ Signature of Patient or Authorized Representative            Date                   Time    __________________________________________ Nurse's Signature

## 2011-10-31 MED ORDER — DEXAMETHASONE SODIUM PHOSPHATE 10 MG/ML IJ SOLN
10.0000 mg | Freq: Once | INTRAMUSCULAR | Status: AC
  Administered 2011-11-01: 10 mg via INTRAVENOUS
  Filled 2011-10-31: qty 1

## 2011-10-31 MED ORDER — VANCOMYCIN HCL 500 MG IV SOLR
500.0000 mg | Freq: Once | INTRAVENOUS | Status: AC
  Administered 2011-11-01: 500 mg via INTRAVENOUS
  Filled 2011-10-31: qty 500

## 2011-11-01 ENCOUNTER — Encounter (HOSPITAL_COMMUNITY): Payer: Self-pay | Admitting: Neurosurgery

## 2011-11-01 ENCOUNTER — Encounter (HOSPITAL_COMMUNITY): Payer: Self-pay | Admitting: Vascular Surgery

## 2011-11-01 ENCOUNTER — Encounter (HOSPITAL_COMMUNITY)
Admission: RE | Disposition: A | Discharge status: Home Health | Payer: Self-pay | Source: Ambulatory Visit | Attending: Neurosurgery

## 2011-11-01 ENCOUNTER — Inpatient Hospital Stay (HOSPITAL_COMMUNITY)
Admission: RE | Admit: 2011-11-01 | Discharge: 2011-11-03 | DRG: 490 | Disposition: A | Discharge status: Home Health | Payer: Medicare Other | Source: Ambulatory Visit | Attending: Neurosurgery | Admitting: Neurosurgery

## 2011-11-01 ENCOUNTER — Encounter (HOSPITAL_COMMUNITY): Payer: Self-pay | Admitting: *Deleted

## 2011-11-01 ENCOUNTER — Inpatient Hospital Stay (HOSPITAL_COMMUNITY): Payer: Medicare Other | Admitting: Vascular Surgery

## 2011-11-01 ENCOUNTER — Inpatient Hospital Stay (HOSPITAL_COMMUNITY): Payer: Medicare Other

## 2011-11-01 DIAGNOSIS — N289 Disorder of kidney and ureter, unspecified: Secondary | ICD-10-CM | POA: Diagnosis present

## 2011-11-01 DIAGNOSIS — N400 Enlarged prostate without lower urinary tract symptoms: Secondary | ICD-10-CM | POA: Diagnosis present

## 2011-11-01 DIAGNOSIS — M48061 Spinal stenosis, lumbar region without neurogenic claudication: Secondary | ICD-10-CM | POA: Diagnosis present

## 2011-11-01 DIAGNOSIS — Z01812 Encounter for preprocedural laboratory examination: Secondary | ICD-10-CM

## 2011-11-01 DIAGNOSIS — M5116 Intervertebral disc disorders with radiculopathy, lumbar region: Secondary | ICD-10-CM | POA: Diagnosis present

## 2011-11-01 DIAGNOSIS — K219 Gastro-esophageal reflux disease without esophagitis: Secondary | ICD-10-CM | POA: Diagnosis present

## 2011-11-01 DIAGNOSIS — Z01818 Encounter for other preprocedural examination: Secondary | ICD-10-CM

## 2011-11-01 DIAGNOSIS — C9000 Multiple myeloma not having achieved remission: Secondary | ICD-10-CM | POA: Diagnosis present

## 2011-11-01 DIAGNOSIS — M5126 Other intervertebral disc displacement, lumbar region: Principal | ICD-10-CM | POA: Diagnosis present

## 2011-11-01 DIAGNOSIS — K5900 Constipation, unspecified: Secondary | ICD-10-CM | POA: Diagnosis present

## 2011-11-01 HISTORY — PX: LUMBAR LAMINECTOMY/DECOMPRESSION MICRODISCECTOMY: SHX5026

## 2011-11-01 LAB — BASIC METABOLIC PANEL WITH GFR
BUN: 31 mg/dL — ABNORMAL HIGH (ref 6–23)
CO2: 29 meq/L (ref 19–32)
Calcium: 10.7 mg/dL — ABNORMAL HIGH (ref 8.4–10.5)
Chloride: 94 meq/L — ABNORMAL LOW (ref 96–112)
Creatinine, Ser: 1.49 mg/dL — ABNORMAL HIGH (ref 0.50–1.35)
GFR calc Af Amer: 52 mL/min — ABNORMAL LOW
GFR calc non Af Amer: 44 mL/min — ABNORMAL LOW
Glucose, Bld: 84 mg/dL (ref 70–99)
Potassium: 4.1 meq/L (ref 3.5–5.1)
Sodium: 127 meq/L — ABNORMAL LOW (ref 135–145)

## 2011-11-01 SURGERY — LUMBAR LAMINECTOMY/DECOMPRESSION MICRODISCECTOMY 2 LEVELS
Anesthesia: General | Site: Back | Laterality: Right | Wound class: Clean

## 2011-11-01 MED ORDER — OXYCODONE-ACETAMINOPHEN 5-325 MG PO TABS
1.0000 | ORAL_TABLET | ORAL | Status: DC | PRN
  Administered 2011-11-01 – 2011-11-02 (×2): 2 via ORAL
  Filled 2011-11-01 (×2): qty 2

## 2011-11-01 MED ORDER — ONDANSETRON HCL 4 MG/2ML IJ SOLN
INTRAMUSCULAR | Status: DC | PRN
  Administered 2011-11-01: 4 mg via INTRAVENOUS

## 2011-11-01 MED ORDER — FLEET ENEMA 7-19 GM/118ML RE ENEM
1.0000 | ENEMA | Freq: Once | RECTAL | Status: AC | PRN
  Filled 2011-11-01: qty 1

## 2011-11-01 MED ORDER — BUPIVACAINE HCL (PF) 0.25 % IJ SOLN
INTRAMUSCULAR | Status: DC | PRN
  Administered 2011-11-01: 20 mL

## 2011-11-01 MED ORDER — PHENOL 1.4 % MT LIQD: 1.0000 | OROMUCOSAL | Status: DC | PRN

## 2011-11-01 MED ORDER — ORPHENADRINE CITRATE ER 100 MG PO TB12
100.0000 mg | ORAL_TABLET | Freq: Two times a day (BID) | ORAL | Status: DC
  Administered 2011-11-02 – 2011-11-03 (×3): 100 mg via ORAL
  Filled 2011-11-01 (×5): qty 1

## 2011-11-01 MED ORDER — FENTANYL CITRATE 0.05 MG/ML IJ SOLN
INTRAMUSCULAR | Status: DC | PRN
  Administered 2011-11-01: 50 ug via INTRAVENOUS
  Administered 2011-11-01: 100 ug via INTRAVENOUS
  Administered 2011-11-01: 50 ug via INTRAVENOUS

## 2011-11-01 MED ORDER — HYDROMORPHONE HCL PF 1 MG/ML IJ SOLN
0.5000 mg | INTRAMUSCULAR | Status: DC | PRN
Start: 2011-11-01 — End: 2011-11-03
  Administered 2011-11-02: 1 mg via INTRAVENOUS
  Filled 2011-11-01: qty 1

## 2011-11-01 MED ORDER — SORBITOL 70 % PO SOLN
30.0000 mL | Freq: Two times a day (BID) | ORAL | Status: DC
  Administered 2011-11-01 – 2011-11-03 (×3): 30 mL via ORAL
  Filled 2011-11-01 (×19): qty 30

## 2011-11-01 MED ORDER — AMLODIPINE BESYLATE 2.5 MG PO TABS
2.5000 mg | ORAL_TABLET | Freq: Every day | ORAL | Status: DC
  Administered 2011-11-02 – 2011-11-03 (×2): 2.5 mg via ORAL
  Filled 2011-11-01 (×2): qty 1

## 2011-11-01 MED ORDER — VANCOMYCIN HCL IN DEXTROSE 1-5 GM/200ML-% IV SOLN
1000.0000 mg | Freq: Once | INTRAVENOUS | Status: AC
  Administered 2011-11-01: 1000 mg via INTRAVENOUS
  Filled 2011-11-01: qty 200

## 2011-11-01 MED ORDER — TAMSULOSIN HCL 0.4 MG PO CAPS
0.4000 mg | ORAL_CAPSULE | Freq: Every day | ORAL | Status: DC
  Administered 2011-11-01 – 2011-11-03 (×3): 0.4 mg via ORAL
  Filled 2011-11-01 (×3): qty 1

## 2011-11-01 MED ORDER — LIDOCAINE HCL (CARDIAC) 20 MG/ML IV SOLN
INTRAVENOUS | Status: DC | PRN
  Administered 2011-11-01: 100 mg via INTRAVENOUS

## 2011-11-01 MED ORDER — THROMBIN 5000 UNITS EX KIT
PACK | CUTANEOUS | Status: DC | PRN
  Administered 2011-11-01 (×2): 5000 [IU] via TOPICAL

## 2011-11-01 MED ORDER — BISACODYL 10 MG RE SUPP: 10.0000 mg | Freq: Every day | RECTAL | Status: DC | PRN

## 2011-11-01 MED ORDER — DICLOFENAC SODIUM 75 MG PO TBEC
75.0000 mg | DELAYED_RELEASE_TABLET | Freq: Two times a day (BID) | ORAL | Status: DC
  Administered 2011-11-01 – 2011-11-03 (×4): 75 mg via ORAL
  Filled 2011-11-01 (×5): qty 1

## 2011-11-01 MED ORDER — CYCLOBENZAPRINE HCL 10 MG PO TABS
10.0000 mg | ORAL_TABLET | Freq: Three times a day (TID) | ORAL | Status: DC | PRN
  Administered 2011-11-01 – 2011-11-02 (×2): 10 mg via ORAL
  Filled 2011-11-01 (×2): qty 1

## 2011-11-01 MED ORDER — EPHEDRINE SULFATE 50 MG/ML IJ SOLN
INTRAMUSCULAR | Status: DC | PRN
  Administered 2011-11-01: 10 mg via INTRAVENOUS
  Administered 2011-11-01: 5 mg via INTRAVENOUS

## 2011-11-01 MED ORDER — NEBIVOLOL HCL 5 MG PO TABS
5.0000 mg | ORAL_TABLET | Freq: Every day | ORAL | Status: DC
  Administered 2011-11-02 – 2011-11-03 (×2): 5 mg via ORAL
  Filled 2011-11-01 (×2): qty 1

## 2011-11-01 MED ORDER — MUPIROCIN 2 % EX OINT
TOPICAL_OINTMENT | Freq: Two times a day (BID) | CUTANEOUS | Status: DC
  Administered 2011-11-01 – 2011-11-02 (×2): via NASAL
  Administered 2011-11-02: 1 via NASAL
  Administered 2011-11-03: 11:00:00 via NASAL
  Filled 2011-11-01: qty 22

## 2011-11-01 MED ORDER — 0.9 % SODIUM CHLORIDE (POUR BTL) OPTIME
TOPICAL | Status: DC | PRN
  Administered 2011-11-01: 1000 mL

## 2011-11-01 MED ORDER — ACETAMINOPHEN 325 MG PO TABS: 650.0000 mg | ORAL_TABLET | ORAL | Status: DC | PRN

## 2011-11-01 MED ORDER — SODIUM CHLORIDE 0.9 % IV SOLN
250.0000 mL | INTRAVENOUS | Status: DC
  Administered 2011-11-03: 250 mL via INTRAVENOUS

## 2011-11-01 MED ORDER — PANTOPRAZOLE SODIUM 40 MG PO TBEC
40.0000 mg | DELAYED_RELEASE_TABLET | Freq: Every day | ORAL | Status: DC
  Administered 2011-11-02 – 2011-11-03 (×2): 40 mg via ORAL
  Filled 2011-11-01 (×2): qty 1

## 2011-11-01 MED ORDER — HYDROCODONE-ACETAMINOPHEN 5-325 MG PO TABS
1.0000 | ORAL_TABLET | ORAL | Status: DC | PRN
  Administered 2011-11-01: 1 via ORAL
  Administered 2011-11-02 – 2011-11-03 (×2): 2 via ORAL
  Filled 2011-11-01 (×2): qty 2
  Filled 2011-11-01: qty 1

## 2011-11-01 MED ORDER — SODIUM CHLORIDE 0.9 % IJ SOLN: 3.0000 mL | INTRAMUSCULAR | Status: DC | PRN

## 2011-11-01 MED ORDER — ATORVASTATIN CALCIUM 10 MG PO TABS
10.0000 mg | ORAL_TABLET | Freq: Every day | ORAL | Status: DC
  Administered 2011-11-01 – 2011-11-03 (×3): 10 mg via ORAL
  Filled 2011-11-01 (×3): qty 1

## 2011-11-01 MED ORDER — PROPOFOL 10 MG/ML IV EMUL
INTRAVENOUS | Status: DC | PRN
  Administered 2011-11-01: 200 mg via INTRAVENOUS

## 2011-11-01 MED ORDER — BACITRACIN 50000 UNITS IM SOLR
INTRAMUSCULAR | Status: AC
  Filled 2011-11-01: qty 1

## 2011-11-01 MED ORDER — SODIUM CHLORIDE 0.9 % IV SOLN
INTRAVENOUS | Status: AC
  Filled 2011-11-01: qty 500

## 2011-11-01 MED ORDER — SENNA 8.6 MG PO TABS
1.0000 | ORAL_TABLET | Freq: Two times a day (BID) | ORAL | Status: DC
  Administered 2011-11-01 – 2011-11-03 (×4): 8.6 mg via ORAL
  Filled 2011-11-01 (×6): qty 1

## 2011-11-01 MED ORDER — ONDANSETRON HCL 4 MG/2ML IJ SOLN: 4.0000 mg | INTRAMUSCULAR | Status: DC | PRN

## 2011-11-01 MED ORDER — SODIUM CHLORIDE 0.9 % IR SOLN
Status: DC | PRN
  Administered 2011-11-01: 13:00:00

## 2011-11-01 MED ORDER — DROPERIDOL 2.5 MG/ML IJ SOLN: 0.6250 mg | INTRAMUSCULAR | Status: DC | PRN

## 2011-11-01 MED ORDER — VECURONIUM BROMIDE 10 MG IV SOLR
INTRAVENOUS | Status: DC | PRN
  Administered 2011-11-01: 7 mg via INTRAVENOUS
  Administered 2011-11-01: 1 mg via INTRAVENOUS

## 2011-11-01 MED ORDER — ACETAMINOPHEN 650 MG RE SUPP: 650.0000 mg | RECTAL | Status: DC | PRN

## 2011-11-01 MED ORDER — MENTHOL 3 MG MT LOZG: 1.0000 | LOZENGE | OROMUCOSAL | Status: DC | PRN

## 2011-11-01 MED ORDER — MUPIROCIN 2 % EX OINT
TOPICAL_OINTMENT | Freq: Every day | CUTANEOUS | Status: DC
  Administered 2011-11-01: 17:00:00 via TOPICAL
  Filled 2011-11-01: qty 22

## 2011-11-01 MED ORDER — ZOLPIDEM TARTRATE 5 MG PO TABS: 5.0000 mg | ORAL_TABLET | Freq: Every evening | ORAL | Status: DC | PRN

## 2011-11-01 MED ORDER — POLYETHYLENE GLYCOL 3350 17 G PO PACK
17.0000 g | PACK | Freq: Every day | ORAL | Status: DC | PRN
  Filled 2011-11-01: qty 1

## 2011-11-01 MED ORDER — HEMOSTATIC AGENTS (NO CHARGE) OPTIME
TOPICAL | Status: DC | PRN
  Administered 2011-11-01: 1 via TOPICAL

## 2011-11-01 MED ORDER — SODIUM CHLORIDE 0.9 % IJ SOLN
3.0000 mL | Freq: Two times a day (BID) | INTRAMUSCULAR | Status: DC
  Administered 2011-11-01 – 2011-11-03 (×4): 3 mL via INTRAVENOUS

## 2011-11-01 MED ORDER — ALUM & MAG HYDROXIDE-SIMETH 200-200-20 MG/5ML PO SUSP: 30.0000 mL | Freq: Four times a day (QID) | ORAL | Status: DC | PRN

## 2011-11-01 MED ORDER — HYDROMORPHONE HCL PF 1 MG/ML IJ SOLN
0.2500 mg | INTRAMUSCULAR | Status: DC | PRN
  Administered 2011-11-01: 0.5 mg via INTRAVENOUS

## 2011-11-01 MED ORDER — LACTATED RINGERS IV SOLN
INTRAVENOUS | Status: DC | PRN
  Administered 2011-11-01 (×2): via INTRAVENOUS

## 2011-11-01 MED ORDER — HYDROMORPHONE HCL PF 1 MG/ML IJ SOLN
INTRAMUSCULAR | Status: AC
  Filled 2011-11-01: qty 1

## 2011-11-01 MED ORDER — FINASTERIDE 5 MG PO TABS
5.0000 mg | ORAL_TABLET | Freq: Every day | ORAL | Status: DC
  Administered 2011-11-02 – 2011-11-03 (×2): 5 mg via ORAL
  Filled 2011-11-01 (×2): qty 1

## 2011-11-01 SURGICAL SUPPLY — 50 items
ADH SKN CLS APL DERMABOND .7 (GAUZE/BANDAGES/DRESSINGS) ×1
APL SKNCLS STERI-STRIP NONHPOA (GAUZE/BANDAGES/DRESSINGS) ×1
BAG DECANTER FOR FLEXI CONT (MISCELLANEOUS) ×2 IMPLANT
BENZOIN TINCTURE PRP APPL 2/3 (GAUZE/BANDAGES/DRESSINGS) ×2 IMPLANT
BLADE SURG ROTATE 9660 (MISCELLANEOUS) IMPLANT
BRUSH SCRUB EZ PLAIN DRY (MISCELLANEOUS) ×2 IMPLANT
BUR CUTTER 7.0 ROUND (BURR) ×2 IMPLANT
CANISTER SUCTION 2500CC (MISCELLANEOUS) ×2 IMPLANT
CLOTH BEACON ORANGE TIMEOUT ST (SAFETY) ×2 IMPLANT
CONT SPEC 4OZ CLIKSEAL STRL BL (MISCELLANEOUS) ×2 IMPLANT
DECANTER SPIKE VIAL GLASS SM (MISCELLANEOUS) ×2 IMPLANT
DERMABOND ADVANCED (GAUZE/BANDAGES/DRESSINGS) ×1
DERMABOND ADVANCED .7 DNX12 (GAUZE/BANDAGES/DRESSINGS) ×1 IMPLANT
DRAPE LAPAROTOMY 100X72X124 (DRAPES) ×2 IMPLANT
DRAPE MICROSCOPE ZEISS OPMI (DRAPES) ×2 IMPLANT
DRAPE POUCH INSTRU U-SHP 10X18 (DRAPES) ×2 IMPLANT
DRAPE PROXIMA HALF (DRAPES) IMPLANT
DRAPE SURG 17X23 STRL (DRAPES) ×4 IMPLANT
ELECT REM PT RETURN 9FT ADLT (ELECTROSURGICAL) ×2
ELECTRODE REM PT RTRN 9FT ADLT (ELECTROSURGICAL) ×1 IMPLANT
GAUZE SPONGE 4X4 16PLY XRAY LF (GAUZE/BANDAGES/DRESSINGS) IMPLANT
GLOVE BIO SURGEON STRL SZ8 (GLOVE) ×1 IMPLANT
GLOVE ECLIPSE 8.5 STRL (GLOVE) ×2 IMPLANT
GLOVE EXAM NITRILE LRG STRL (GLOVE) IMPLANT
GLOVE EXAM NITRILE MD LF STRL (GLOVE) ×1 IMPLANT
GLOVE EXAM NITRILE XL STR (GLOVE) IMPLANT
GLOVE EXAM NITRILE XS STR PU (GLOVE) IMPLANT
GLOVE INDICATOR 8.5 STRL (GLOVE) ×1 IMPLANT
GOWN BRE IMP SLV AUR LG STRL (GOWN DISPOSABLE) IMPLANT
GOWN BRE IMP SLV AUR XL STRL (GOWN DISPOSABLE) ×5 IMPLANT
GOWN STRL REIN 2XL LVL4 (GOWN DISPOSABLE) IMPLANT
KIT BASIN OR (CUSTOM PROCEDURE TRAY) ×2 IMPLANT
KIT ROOM TURNOVER OR (KITS) ×2 IMPLANT
NDL SPNL 22GX3.5 QUINCKE BK (NEEDLE) ×1 IMPLANT
NEEDLE HYPO 22GX1.5 SAFETY (NEEDLE) ×2 IMPLANT
NEEDLE SPNL 22GX3.5 QUINCKE BK (NEEDLE) ×2 IMPLANT
NS IRRIG 1000ML POUR BTL (IV SOLUTION) ×2 IMPLANT
PACK LAMINECTOMY NEURO (CUSTOM PROCEDURE TRAY) ×2 IMPLANT
PAD ARMBOARD 7.5X6 YLW CONV (MISCELLANEOUS) ×6 IMPLANT
RUBBERBAND STERILE (MISCELLANEOUS) ×4 IMPLANT
SPONGE GAUZE 4X4 12PLY (GAUZE/BANDAGES/DRESSINGS) ×2 IMPLANT
SPONGE SURGIFOAM ABS GEL SZ50 (HEMOSTASIS) ×2 IMPLANT
STRIP CLOSURE SKIN 1/2X4 (GAUZE/BANDAGES/DRESSINGS) ×2 IMPLANT
SUT VIC AB 2-0 CT1 18 (SUTURE) ×2 IMPLANT
SUT VIC AB 3-0 SH 8-18 (SUTURE) ×2 IMPLANT
SYR 20ML ECCENTRIC (SYRINGE) ×2 IMPLANT
TAPE CLOTH SURG 4X10 WHT LF (GAUZE/BANDAGES/DRESSINGS) ×1 IMPLANT
TOWEL OR 17X24 6PK STRL BLUE (TOWEL DISPOSABLE) ×2 IMPLANT
TOWEL OR 17X26 10 PK STRL BLUE (TOWEL DISPOSABLE) ×2 IMPLANT
WATER STERILE IRR 1000ML POUR (IV SOLUTION) ×2 IMPLANT

## 2011-11-01 NOTE — Plan of Care (Signed)
Problem: Consults Goal: Diagnosis - Spinal Surgery L3-L4-L5 lami/decompression

## 2011-11-01 NOTE — Anesthesia Postprocedure Evaluation (Signed)
Anesthesia Post Note  Patient: [name]  Procedure(s) Performed: Procedure(s) (LRB): LUMBAR LAMINECTOMY/DECOMPRESSION MICRODISCECTOMY 2 LEVELS (Right)  Anesthesia type: general  Patient location: PACU  Post pain: Pain level controlled  Post assessment: Patient's Cardiovascular Status Stable  Last Vitals:  Filed Vitals:   11/01/11 1545  BP:   Pulse: 68  Temp:   Resp: 21    Post vital signs: Reviewed and stable  Level of consciousness: sedated  Complications: No apparent anesthesia complications

## 2011-11-01 NOTE — Op Note (Signed)
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 Right L3-4 stenosis.  Postoperative diagnosis: Same  Procedure Name: Right L2-3 decompressive laminotomy with a right L2 and L3 decompressive foraminotomies. Right L2-3 microdiscectomy.  Right L3-4 decompressive laminotomy with a right L3 and L4 decompressive foraminotomies.  Surgeon:Bryten Maher A.Dailyn Kempner, M.D.  Asst. Surgeon: Wynetta Emery  Anesthesia: General  Indication: 76 year old male with right lower extremity pain. Using weakest cyst with a mix lumbar radiculopathy and elements of neurogenic claudication as well peer workup demonstrates evidence of multilevel spondylosis with moderately severe stenosis at L2-3 and severe stenosis at L3-4. At L2-3 on the right side and inferior disc herniation is causing marked compression of thecal sac and right-sided L3 nerve root. Patient presents now for decompression in hopes of improving his symptoms.  Operative note: After induction anesthesia, patient positioned prone and then prepped and draped sterilely. Incision was made overlying the upper lumbar spine. Supper off Sexton performed lamina were exposed. X-rays taken and the L1-2 level was localized. Dissection was redirected 1 level caudally. Lamina of L2 and L3 were dissected free as was the lamina of L4. Deep self-retaining are in place laminotomies then performed using a high-speed drill and Kerrison rongeurs. Laminotomy at L2-3 was then further widened and the lateral gutter was undercut. Ligamentum flavum was elevated and resected piecemeal fashion. Ligament of her a laminotomy at L3-4 was also widened and undercut through the gutter. Decompressive foraminotomies in the form of course exiting L3 and L4 nerve roots. Microscope was then brought field these were microdissection of the right side at L2-3 disc space. Epidermides pus was quite  limited and cut cut. Thecal sac and L3 nerve root were gently mobilized and retracted towards midline. Inferior disc herniation was encountered. This dissected free and removed using pituitary rongeurs. The space itself was recently flat and intradiscal discectomy was not felt to be necessary. At this point a very thorough decompression achieved. Wide decompressive foraminotomies had been performed on of course exiting L2-L3 and L4 nerve root on the right side. There is no evidence of injury to thecal sac and nerve. Wound is then irrigated. Hemostasis achieved with bipolar try her. Gelfoam was placed topically for hemostasis. Wounds and closed in a typical fashion. Steri-Strips triggers were applied. There were no apparent locations. Patient tolerated the procedure well and returned to recovery room postop.

## 2011-11-01 NOTE — Brief Op Note (Signed)
11/01/2011  2:38 PM  PATIENT:  [name]  76 y.o. male  PRE-OPERATIVE DIAGNOSIS:  Herniated Nucleus Pulposus /stenosis  POST-OPERATIVE DIAGNOSIS:  Herniated Nucleus Pulposus /stenosis  PROCEDURE:  Procedure(s) (LRB): LUMBAR LAMINECTOMY/DECOMPRESSION MICRODISCECTOMY 2 LEVELS (Right)  SURGEON:  Surgeon(s) and Role:    * Temple Pacini, MD - Primary    * Mariam Dollar, MD - Assisting  PHYSICIAN ASSISTANT:   ASSISTANTS: Cram   ANESTHESIA:   general  EBL:  Total I/O In: 1300 [I.V.:1300] Out: 100 [Blood:100]  BLOOD ADMINISTERED:none  DRAINS: none   LOCAL MEDICATIONS USED:  MARCAINE     SPECIMEN:  No Specimen  DISPOSITION OF SPECIMEN:  N/A  COUNTS:  YES  TOURNIQUET:  * No tourniquets in log *  DICTATION: .Dragon Dictation  PLAN OF CARE: Admit to inpatient   PATIENT DISPOSITION:  PACU - hemodynamically stable.   Delay start of Pharmacological VTE agent (>24hrs) due to surgical blood loss or risk of bleeding: yes

## 2011-11-01 NOTE — Preoperative (Signed)
Beta Blockers   Reason not to administer Beta Blockers:Not Applicable 

## 2011-11-01 NOTE — Anesthesia Preprocedure Evaluation (Addendum)
Anesthesia Evaluation  Patient identified by MRN, date of birth, ID band Patient awake    Reviewed: Allergy & Precautions, H&P , NPO status , Patient's Chart, lab work & pertinent test results, reviewed documented beta blocker date and time   Airway Mallampati: II TM Distance: >3 FB Neck ROM: full    Dental  (+) Dental Advidsory Given and Teeth Intact   Pulmonary sleep apnea ,  breath sounds clear to auscultation        Cardiovascular hypertension, + angina Rhythm:regular Rate:Normal     Neuro/Psych  Neuromuscular disease    GI/Hepatic GERD-  Medicated and Controlled,  Endo/Other    Renal/GU      Musculoskeletal   Abdominal   Peds  Hematology   Anesthesia Other Findings   Reproductive/Obstetrics                          Anesthesia Physical Anesthesia Plan  ASA: III  Anesthesia Plan: General   Post-op Pain Management:    Induction: Intravenous  Airway Management Planned: Oral ETT  Additional Equipment:   Intra-op Plan:   Post-operative Plan: Extubation in OR  Informed Consent: I have reviewed the patients History and Physical, chart, labs and discussed the procedure including the risks, benefits and alternatives for the proposed anesthesia with the patient or authorized representative who has indicated his/her understanding and acceptance.   Dental Advisory Given and Dental advisory given  Plan Discussed with: CRNA, Surgeon and Anesthesiologist  Anesthesia Plan Comments:        Anesthesia Quick Evaluation

## 2011-11-01 NOTE — H&P (Signed)
[name] is an 76 y.o. male.   Chief Complaint: Right leg weakness HPI: 76 year old male with progressive right lower extremity weakness. Patient has lost strength sensation in his right lower extremity for the past few weeks. Patient has significant weakness of knee extension and dorsiflexion. Workup demonstrates evidence of marked stenosis at L2-3 and L3-4 with a superimposed right-sided L2-3 disc herniation with inferior free fragment. Patient has decided to proceed with operative decompression in hopes of improving his symptoms. 2  Past Medical History  Diagnosis Date  . GERD (gastroesophageal reflux disease)   . Hypertension   . BPH (benign prostatic hyperplasia)   . Lumbar herniated disc L4-5  . Hyperlipidemia   . Atypical angina     june 2004  . Sleep apnea   . Multiple myeloma 02/19/2008    Past Surgical History  Procedure Date  . Neck surgery     Family History  Problem Relation Age of Onset  . Adopted: Yes   Social History:  reports that he has quit smoking. His smoking use included Cigarettes. He does not have any smokeless tobacco history on file. He reports that he does not drink alcohol or use illicit drugs.  Allergies:  Allergies  Allergen Reactions  . Penicillins Rash    Medications Prior to Admission  Medication Sig Dispense Refill  . amLODipine (NORVASC) 2.5 MG tablet Take 1 tablet by mouth daily.      Marland Kitchen atorvastatin (LIPITOR) 10 MG tablet Take 1 tablet by mouth Daily.      Marland Kitchen BYSTOLIC 5 MG tablet Take 1 tablet by mouth daily.      . diclofenac (VOLTAREN) 75 MG EC tablet Take 75 mg by mouth 2 (two) times daily.      . finasteride (PROSCAR) 5 MG tablet Take 5 mg by mouth daily.        . mupirocin ointment (BACTROBAN) 2 %       . omeprazole (PRILOSEC) 20 MG capsule Take 1 tablet by mouth daily.      . orphenadrine (NORFLEX) 100 MG tablet Take 100 mg by mouth 2 (two) times daily.      Marland Kitchen oxyCODONE-acetaminophen (PERCOCET) 7.5-325 MG per tablet Take 1 tablet  by mouth every 4 (four) hours as needed. For pain      . sorbitol 70 % solution Take 30 mLs by mouth 2 (two) times daily. Decrease to daily when bowels become regular  1800 mL  1  . Tamsulosin HCl (FLOMAX) 0.4 MG CAPS Take 1 tablet by mouth daily.        Results for orders placed during the hospital encounter of 11/01/11 (from the past 48 hour(s))  BASIC METABOLIC PANEL     Status: Abnormal   Collection Time   11/01/11  9:36 AM      Component Value Range Comment   Sodium 127 (*) 135 - 145 (mEq/L)    Potassium 4.1  3.5 - 5.1 (mEq/L)    Chloride 94 (*) 96 - 112 (mEq/L)    CO2 29  19 - 32 (mEq/L)    Glucose, Bld 84  70 - 99 (mg/dL)    BUN 31 (*) 6 - 23 (mg/dL)    Creatinine, Ser 9.14 (*) 0.50 - 1.35 (mg/dL)    Calcium 78.2 (*) 8.4 - 10.5 (mg/dL)    GFR calc non Af Amer 44 (*) >90 (mL/min)    GFR calc Af Amer 52 (*) >90 (mL/min)    No results found.  Review of Systems  Constitutional: Negative.   HENT: Negative.   Eyes: Negative.   Respiratory: Negative.   Cardiovascular: Negative.   Gastrointestinal: Negative.   Genitourinary: Negative.   Musculoskeletal: Negative.   Skin: Negative.   Neurological: Negative.   Endo/Heme/Allergies: Negative.   Psychiatric/Behavioral: Negative.     Blood pressure 94/70, pulse 58, temperature 97.3 F (36.3 C), temperature source Oral, resp. rate 18, SpO2 97.00%. Physical Exam  Constitutional: He is oriented to person, place, and time. He appears well-developed and well-nourished.  HENT:  Head: Normocephalic and atraumatic.  Right Ear: External ear normal.  Left Ear: External ear normal.  Mouth/Throat: Oropharynx is clear and moist.  Eyes: Conjunctivae and EOM are normal. Pupils are equal, round, and reactive to light.  Neck: Normal range of motion. Neck supple. No tracheal deviation present. No thyromegaly present.  Cardiovascular: Normal rate, regular rhythm and intact distal pulses.  Exam reveals no friction rub.   No murmur  heard. Respiratory: Effort normal and breath sounds normal. No respiratory distress. He has no wheezes.  GI: Soft. Bowel sounds are normal. He exhibits no distension. There is no tenderness.  Musculoskeletal: Normal range of motion. He exhibits no edema and no tenderness.  Neurological: He is alert and oriented to person, place, and time. He has normal reflexes. No cranial nerve deficit. Coordination normal.  Skin: Skin is warm and dry. No rash noted. No erythema. No pallor.  Psychiatric: He has a normal mood and affect. His behavior is normal. Judgment and thought content normal.     Assessment/Plan Right L2-3 stenosis with a right L2-3 herniated nucleus pulposus with radiculopathy. Right L3-4 stenosis. Plan right L2-3 decompressive laminotomy and microdiscectomy and right L3-4 decompressive laminotomy and foraminotomies. Risks and benefits have been explained. Patient wishes to proceed.  [name] A 11/01/2011, 11:47 AM

## 2011-11-01 NOTE — Anesthesia Procedure Notes (Signed)
Procedure Name: Intubation Date/Time: 11/01/2011 12:21 PM Performed by: Carmela Rima Pre-anesthesia Checklist: Emergency Drugs available, Patient identified, Timeout performed, Suction available and Patient being monitored Patient Re-evaluated:Patient Re-evaluated prior to inductionOxygen Delivery Method: Circle system utilized Preoxygenation: Pre-oxygenation with 100% oxygen Intubation Type: IV induction Ventilation: Mask ventilation without difficulty Laryngoscope Size: Mac and 3 Grade View: Grade I Tube type: Oral Number of attempts: 1 Placement Confirmation: ETT inserted through vocal cords under direct vision,  breath sounds checked- equal and bilateral and positive ETCO2 Secured at: 23 cm Tube secured with: Tape Dental Injury: Teeth and Oropharynx as per pre-operative assessment

## 2011-11-01 NOTE — Consult Note (Signed)
ANTIBIOTIC CONSULT NOTE - INITIAL  Pharmacy Consult for Vancomycin Indication: surgical prophylaxis  Allergies  Allergen Reactions  . Penicillins Rash   Patient Measurements: Height: 6' 0.05" (183 cm) Weight: 216 lb 4.3 oz (98.1 kg) IBW/kg (Calculated) : 77.71   Vital Signs: Temp: 97.6 F (36.4 C) (04/22 1600) Temp src: Oral (04/22 0930) BP: 110/78 mmHg (04/22 1600) Pulse Rate: 67  (04/22 1600) Intake/Output from previous day:   Intake/Output from this shift: Total I/O In: 1400 [I.V.:1400] Out: 100 [Blood:100]  Labs:  [facility] 11/01/11 0936  WBC --  HGB --  PLT --  LABCREA --  CREATININE 1.49*   Estimated Creatinine Clearance: 52.8 ml/min (by C-G formula based on Cr of 1.49).  Microbiology: Recent Results (from the past 720 hour(s))  SURGICAL PCR SCREEN     Status: Abnormal   Collection Time   10/26/11  1:34 PM      Component Value Range Status Comment   MRSA, PCR NEGATIVE  NEGATIVE  Final    Staphylococcus aureus POSITIVE (*) NEGATIVE  Final     Medical History: Past Medical History  Diagnosis Date  . GERD (gastroesophageal reflux disease)   . Hypertension   . BPH (benign prostatic hyperplasia)   . Lumbar herniated disc L4-5  . Hyperlipidemia   . Atypical angina     june 2004  . Sleep apnea   . Multiple myeloma 02/19/2008   Assessment: 76yom s/p spinal surgery to receive one dose of vancomycin post-op for surgical prophylaxis. Received pre-op dose of 500mg  at 1220. Weight is 98.1kg and CrCl 54ml/min. Patient does not have a drain.  Plan:  1) Vancomycin 1000mg  x 1 @ 0000 on 4/23  Fredrik Rigger 11/01/2011,4:58 PM

## 2011-11-01 NOTE — Transfer of Care (Signed)
Immediate Anesthesia Transfer of Care Note  Patient: [name]  Procedure(s) Performed: Procedure(s) (LRB): LUMBAR LAMINECTOMY/DECOMPRESSION MICRODISCECTOMY 2 LEVELS (Right)  Patient Location: PACU  Anesthesia Type: General  Level of Consciousness: awake, alert  and oriented  Airway & Oxygen Therapy: Patient Spontanous Breathing and Patient connected to nasal cannula oxygen  Post-op Assessment: Report given to PACU RN  Post vital signs: Reviewed and stable  Complications: No apparent anesthesia complications

## 2011-11-02 ENCOUNTER — Encounter (HOSPITAL_COMMUNITY): Payer: Self-pay | Admitting: Neurosurgery

## 2011-11-02 NOTE — Progress Notes (Signed)
UR COMPLETED  

## 2011-11-02 NOTE — Progress Notes (Signed)
Post op day 1.   Patient states that right le feels better.  Still weak and having trouble walking.    afeb with stable vitals. Right quad 4-/5  OW 5/5.  Wound c/d/i/  Mobilize with therapy. Hopefully home in am

## 2011-11-02 NOTE — Progress Notes (Signed)
CARE MANAGEMENT NOTE 11/02/2011  Patient:  [name],[name]   Account Number:  [id number]  Date Initiated:  11/02/2011  Documentation initiated by:  Vance Peper  Subjective/Objective Assessment:   76 yr old male s/p L3-L5 decompression laminectomy.     Action/Plan:   Patient requests [facility], has used them in the past.   Anticipated DC Date:  11/03/2011   Anticipated DC Plan:  HOME W HOME HEALTH SERVICES      DC Planning Services  CM consult      PAC Choice  DURABLE MEDICAL EQUIPMENT  HOME HEALTH   Choice offered to / List presented to:  C-1 Patient   DME arranged  HOSPITAL BED      DME agency  [facility]     HH arranged  HH-2 PT      [facility] agency  [facility]   Status of service:  Completed, signed off  Discharge Disposition:  HOME W HOME HEALTH SERVICES

## 2011-11-02 NOTE — Plan of Care (Signed)
Problem: Consults Goal: Diagnosis - Spinal Surgery Outcome: Completed/Met Date Met:  11/02/11 Lumbar Laminectomy (Complex)

## 2011-11-02 NOTE — Evaluation (Addendum)
Physical Therapy Evaluation Patient Details Name: Araceli Coufal MRN: [id number] DOB: 17-Oct-1935 Today's Date: 11/02/2011 Time: 1478-2956 PT Time Calculation (min): 27 min  PT Assessment / Plan / Recommendation Clinical Impression  Patient is a 76 y.o. male s/p L3-L5 decompression laminectomy due to spinal stenosis and presents with decreased strength and surgical pain limiting indpendence and safety with mobility.  He will benefit from skilled PT in the acute setting to maximize independence and practice stairs for home entry to allow d/c home with wife assist and HHPT.    PT Assessment  Patient needs continued PT services    Follow Up Recommendations  Home health PT;Supervision/Assistance - 24 hour    Equipment Recommendations  Hospital bed    Frequency Min 6X/week    Precautions / Restrictions Precautions Precautions: Back;Fall Precaution Booklet Issued: No Precaution Comments: educated in no bending, arching or twisting; no recent falls, but has had LE weakness and light headedness PTA (feels may be due to meds or constipation)   Pertinent Vitals/Pain 5/10 in back surgical pain      Mobility  Bed Mobility Bed Mobility: Rolling Right;Right Sidelying to Sit Rolling Right: With rail;5: Supervision Right Sidelying to Sit: 5: Supervision;With rails Details for Bed Mobility Assistance: able to demo appropriate technique with back precautions without cues (HOB 25-30*) Transfers Transfers: Sit to Stand;Stand to Sit Sit to Stand: 4: Min guard;With upper extremity assist;From bed Stand to Sit: 5: Supervision;To bed;With upper extremity assist Ambulation/Gait Ambulation/Gait Assistance: 4: Min guard Ambulation Distance (Feet): 150 Feet Assistive device: Rolling walker Ambulation/Gait Assistance Details: forward flexed, decreased clearance on right and with fatigue drags floor Gait Pattern: Decreased step length - right;Trunk flexed    Exercises     PT Goals Acute Rehab PT  Goals PT Goal Formulation: With patient Time For Goal Achievement: 11/05/11 Potential to Achieve Goals: Good Pt will go Sit to Stand: with supervision PT Goal: Sit to Stand - Progress: Goal set today Pt will go Stand to Sit: with supervision PT Goal: Stand to Sit - Progress: Goal set today Pt will Ambulate: >150 feet;with supervision;with rolling walker PT Goal: Ambulate - Progress: Goal set today Pt will Go Up / Down Stairs: 1-2 stairs;with min assist;with rolling walker PT Goal: Up/Down Stairs - Progress: Goal set today Additional Goals Additional Goal #1: Patient to verbalize back precautions independently. PT Goal: Additional Goal #1 - Progress: Goal set today  Visit Information  Last PT Received On: 11/02/11 Assistance Needed: +1    Subjective Data  Subjective: This is my third walk.  Dr. Jordan Likes wanted to send me home today.  Just still weak and light headed. Patient Stated Goal: To go home with assist   Prior Functioning  Home Living Lives With: Spouse Available Help at Discharge: Family Type of Home: House Home Access: Stairs to enter Secretary/administrator of Steps: 2 Home Layout: One level Bathroom Shower/Tub: Engineer, manufacturing systems: Handicapped height Home Adaptive Equipment: Bedside commode/3-in-1;Walker - rolling;Straight cane Additional Comments: wife reports concern for tolerance to bed at home.  Interested in hospital bed for home Prior Function Level of Independence: Independent with assistive device(s) Communication Communication: No difficulties    Cognition  Overall Cognitive Status: Appears within functional limits for tasks assessed/performed Arousal/Alertness: Awake/alert Orientation Level: Appears intact for tasks assessed Behavior During Session: [facility] for tasks performed    Extremity/Trunk Assessment Right Lower Extremity Assessment RLE ROM/Strength/Tone: Deficits RLE ROM/Strength/Tone Deficits: Hip flexion 2/5, knee extension 3-/5, ankle  dorsiflexion 4/5; AAROM WFL RLE Sensation:  WFL - Light Touch Left Lower Extremity Assessment LLE ROM/Strength/Tone: WFL for tasks assessed LLE Sensation: WFL - Light Touch   Balance    End of Session PT - End of Session Equipment Utilized During Treatment: Gait belt Activity Tolerance: Patient limited by fatigue Patient left: in bed;with call bell/phone within reach;with family/visitor present (sitting on edge of bed for lunch) Fitted patient's walker with tennis balls on back legs due to waring of rubber stoppers and dragging loudly in hallway.   Caswell Alvillar,CYNDI 11/02/2011, 3:05 PM

## 2011-11-03 DIAGNOSIS — N289 Disorder of kidney and ureter, unspecified: Secondary | ICD-10-CM

## 2011-11-03 DIAGNOSIS — C9000 Multiple myeloma not having achieved remission: Secondary | ICD-10-CM

## 2011-11-03 LAB — COMPREHENSIVE METABOLIC PANEL
ALT: 72 U/L — ABNORMAL HIGH (ref 0–53)
Albumin: 2.4 g/dL — ABNORMAL LOW (ref 3.5–5.2)
Alkaline Phosphatase: 60 U/L (ref 39–117)
Potassium: 4.3 mEq/L (ref 3.5–5.1)
Sodium: 130 mEq/L — ABNORMAL LOW (ref 135–145)
Total Protein: 9.5 g/dL — ABNORMAL HIGH (ref 6.0–8.3)

## 2011-11-03 LAB — CALCIUM, IONIZED: Calcium, Ion: 1.45 mmol/L — ABNORMAL HIGH (ref 1.12–1.32)

## 2011-11-03 MED ORDER — HYDROCODONE-ACETAMINOPHEN 5-325 MG PO TABS: 1.0000 | ORAL_TABLET | ORAL | Status: DC | PRN

## 2011-11-03 MED ORDER — CYCLOBENZAPRINE HCL 10 MG PO TABS: 10.0000 mg | ORAL_TABLET | Freq: Three times a day (TID) | ORAL | Status: DC | PRN

## 2011-11-03 MED ORDER — SODIUM CHLORIDE 0.9 % IV SOLN
90.0000 mg | Freq: Once | INTRAVENOUS | Status: AC
  Administered 2011-11-03: 90 mg via INTRAVENOUS
  Filled 2011-11-03: qty 500

## 2011-11-03 NOTE — Progress Notes (Signed)
Occupational Therapy Evaluation Patient Details Name: [name] MRN: [id number] DOB: [birth year] Today's Date: 11/03/2011 Time: 4098-1191 OT Time Calculation (min): 16 min  OT Assessment / Plan / Recommendation Clinical Impression  Pt s/p  L3-L5 decompression laminectomy due to spinal stenosis.  Pt has no DME needs, however pt and wife with concern about bed at home.  Recommending hospital bed.  Pt's spouse reports that she will provide all necessary assist.  All education completed.    OT Assessment  Patient does not need any further OT services    Follow Up Recommendations  Supervision/Assistance - 24 hour    Equipment Recommendations  Hospital bed    Frequency      Precautions / Restrictions Precautions Precautions: Back;Fall Precaution Comments: Pt was educated on 3/3 back precaustions Restrictions Weight Bearing Restrictions: No   Pertinent Vitals/Pain NA    ADL  Grooming: Simulated;Set up Where Assessed - Grooming: Unsupported sitting Upper Body Bathing: Simulated;Set up Where Assessed - Upper Body Bathing: Sitting, bed Lower Body Bathing: Simulated;Minimal assistance Where Assessed - Lower Body Bathing: Sit to stand from bed Upper Body Dressing: Simulated;Set up Where Assessed - Upper Body Dressing: Sitting, bed Lower Body Dressing: Simulated;Minimal assistance Where Assessed - Lower Body Dressing: Sit to stand from bed Toilet Transfer: Simulated;Supervision/safety Toilet Transfer Method: Stand pivot Toilet Transfer Equipment: Other (comment) (bed) ADL Comments: Pt has AE at home from previous surgery.  Pt and spouse report knowledege on use of AE, but wife states that she will just assist him.    OT Goals    Visit Information  Last OT Received On: 11/03/11 Assistance Needed: +1    Subjective Data      Prior Functioning  Home Living Lives With: Spouse Available Help at Discharge: Family Type of Home: House Home Access: Stairs to enter Water quality scientist of Steps: 2 Home Layout: One level Bathroom Shower/Tub: Engineer, manufacturing systems: Handicapped height Home Adaptive Equipment: Bedside commode/3-in-1;Walker - rolling;Straight cane Additional Comments: wife reports concern for tolerance to bed at home.  Interested in hospital bed for home Prior Function Level of Independence: Independent with assistive device(s) Communication Communication: No difficulties    Cognition  Overall Cognitive Status: Appears within functional limits for tasks assessed/performed Arousal/Alertness: Awake/alert Orientation Level: Oriented X4 / Intact Behavior During Session: [facility] for tasks performed    Extremity/Trunk Assessment Right Upper Extremity Assessment RUE ROM/Strength/Tone: Within functional levels RUE Sensation: WFL - Light Touch;WFL - Proprioception RUE Coordination: WFL - gross/fine motor Left Upper Extremity Assessment LUE ROM/Strength/Tone: Within functional levels LUE Sensation: WFL - Light Touch;WFL - Proprioception LUE Coordination: WFL - gross/fine motor   Mobility Bed Mobility Bed Mobility: Scooting to HOB;Rolling Right;Right Sidelying to Sit;Sitting - Scoot to Edge of Bed Rolling Right: With rail;4: Min assist (assistance with LE) Right Sidelying to Sit: 4: Min assist (assistance with LE) Sitting - Scoot to Edge of Bed: 6: Modified independent (Device/Increase time);With rail Scooting to [facility] 6: Modified independent (Device/Increase time);With rail Details for Bed Mobility Assistance: Pt required assistance with LE during log roll and descent during sidelying to siting  Transfers Sit to Stand: 5: Supervision;With upper extremity assist;From bed Stand to Sit: 5: Supervision;With upper extremity assist;To bed   Exercise    Balance    End of Session OT - End of Session Activity Tolerance: Patient tolerated treatment well Patient left: in bed;with call bell/phone within reach;with family/visitor present Nurse  Communication: Mobility status  11/03/2011 [name] OTR/L Pager [phone] Office [phone]  [name] 11/03/2011, 5:13 PM

## 2011-11-03 NOTE — Progress Notes (Signed)
Physical Therapy Treatment Patient Details Name: [name] MRN: [id number] DOB: 31-Jan-1936 Today's Date: 11/03/2011 Time: 0910-0940 PT Time Calculation (min): 30 min  PT Assessment / Plan / Recommendation Comments on Treatment Session  Pt making progess with amb. Pt was able to amb stairs using rails. Pt and wife was educated on safety amb stairs and was encouraged to use as much assistance available to ensure safety.         Follow Up Recommendations  Home health PT;Supervision/Assistance - 24 hour    Equipment Recommendations  Hospital bed    Frequency Min 6X/week   Plan Discharge plan remains appropriate;Frequency remains appropriate    Precautions / Restrictions Precautions Precautions: Back;Fall Precaution Comments: Pt was educated on 3/3 back precaustions Restrictions Weight Bearing Restrictions: No   Pertinent Vitals/Pain     Mobility  Bed Mobility Bed Mobility: Scooting to HOB;Rolling Right;Right Sidelying to Sit;Sitting - Scoot to Edge of Bed Rolling Right: With rail;4: Min assist (assistance with LE) Right Sidelying to Sit: 4: Min assist (assistance with LE) Sitting - Scoot to Edge of Bed: 6: Modified independent (Device/Increase time);With rail Scooting to [facility]: 6: Modified independent (Device/Increase time);With rail Details for Bed Mobility Assistance: Pt required assistance with LE during log roll and descent during sidelying to siting  Transfers Transfers: Sit to Stand;Stand to Sit Sit to Stand: 5: Supervision;With upper extremity assist;From bed Stand to Sit: 5: Supervision;With upper extremity assist;To bed Ambulation/Gait Ambulation Distance (Feet): 125 Feet Assistive device: Rolling walker Ambulation/Gait Assistance Details: Pt needed VC to upright postition and to correct R foot drag during amb. Gait Pattern: Within Functional Limits;Step-through pattern;Decreased stride length Stairs: Yes Stairs Assistance: 4: Min assist Stairs Assistance Details  (indicate cue type and reason): assistance controlling desent, educated on "up with strong ,down with the not so strong" Stair Management Technique: Two rails Number of Stairs: 12  ( up and down 3 steps x 2  in stairwell ) Wheelchair Mobility Wheelchair Mobility: No    Exercises     PT Goals Acute Rehab PT Goals PT Goal: Sit to Stand - Progress: Met PT Goal: Stand to Sit - Progress: Met PT Goal: Ambulate - Progress: Progressing toward goal PT Goal: Up/Down Stairs - Progress: Progressing toward goal  Visit Information  Last PT Received On: 11/03/11    Subjective Data  Subjective: Pt's wife stated she is going to get a family member to install rails at home   Cognition  Overall Cognitive Status: Appears within functional limits for tasks assessed/performed Arousal/Alertness: Awake/alert Orientation Level: Oriented X4 / Intact Behavior During Session: [facility] for tasks performed    Balance     End of Session PT - End of Session Equipment Utilized During Treatment: Gait belt Activity Tolerance: Patient tolerated treatment well Patient left: in bed;with call bell/phone within reach;with family/visitor present Nurse Communication: Mobility status    [name]  11/03/2011, 2:50 PM

## 2011-11-03 NOTE — Discharge Instructions (Signed)
Home Health to be provided by [facility] [phone]   Wound Care Keep incision covered and dry for one week.  If you shower prior to then, cover incision with plastic wrap.  You may remove outer bandage after one week and shower.  Do not put any creams, lotions, or ointments on incision. Leave steri-strips on back.  They will fall off by themselves. Activity Walk each and every day, increasing distance each day. No lifting greater than 5 lbs.  Avoid excessive neck motion. No driving for 2 weeks; may ride as a passenger locally. If provided with back brace, wear when out of bed.  It is not necessary to wear brace in bed. Diet Resume your normal diet.  Return to Work Will be discussed at you follow up appointment. Call Your Doctor If Any of These Occur Redness, drainage, or swelling at the wound.  Temperature greater than 101 degrees. Severe pain not relieved by pain medication. Incision starts to come apart. Follow Up Appt Call today for appointment in 1-2 weeks ([phone]) or for problems.  If you have any hardware placed in your spine, you will need an x-ray before your appointment.

## 2011-11-03 NOTE — Discharge Summary (Signed)
Physician Discharge Summary  Patient ID: [name] MRN: [id number] DOB/AGE: Nov 05, 1935 76 y.o.  Admit date: 11/01/2011 Discharge date: 11/03/2011  Admission Diagnoses:  Discharge Diagnoses:  Principal Problem:  *Lumbar disc herniation with radiculopathy   Discharged Condition: fair  Hospital Course: Patient in the hospital for an are where an uncomplicated right-sided L2-3 laminotomy and microdiscectomy and right-sided L3-4 decompressive laminotomy. Postoperatively he has done reasonably well. His preoperative pain and weakness are much improved. Back pain is well controlled. He is having no new neurologic symptoms. He is mobilized with physical therapy. Therapist do believe that he would benefit from home health physical therapy which has been arranged.  Patient has multiple medical problems including significant myeloma causing generalized weakness which complicates his recovery from this does procedure. Due to his overall weakness and his right lower extremity weakness I believe the patient would benefit from a hospital bed for positioning and asked to help get him in and out of bed. He does require positioning would not be that would not be feasible and normal bed. He does require the ability to change positions not feasible within normal bed.  Consults:   Significant Diagnostic Studies:   Treatments:   Discharge Exam: Blood pressure 103/67, pulse 68, temperature 97.6 F (36.4 C), temperature source Oral, resp. rate 16, height 6' 0.05" (1.83 m), weight 98.1 kg (216 lb 4.3 oz), SpO2 92.00%. Patient awake and alert he is oriented appropriate.  Function is intact. Motor examination reveals weakness of his right knee extensors graded out at 4/5 remainder of his motor strength is reasonably intact. Sensory examination is nonfocal. Deep tendon versus are hypoactive throughout but absent in his right patella. Wound is clean dry and intact. Chest and abdomen are benign.  Disposition:  01-Home or Self Care  Discharge Orders    Future Appointments: Provider: Department: Dept Phone: Center:   11/18/2011 11:45 AM Rana Snare, NP [facility] [phone] None   11/29/2011 4:00 PM Waymon Budge, MD [facility] [phone] None     Medication List  As of 11/03/2011  9:24 AM   TAKE these medications         amLODipine 2.5 MG tablet   Commonly known as: NORVASC   Take 1 tablet by mouth daily.      atorvastatin 10 MG tablet   Commonly known as: LIPITOR   Take 1 tablet by mouth Daily.      BYSTOLIC 5 MG tablet   Generic drug: nebivolol   Take 1 tablet by mouth daily.      cyclobenzaprine 10 MG tablet   Commonly known as: FLEXERIL   Take 1 tablet (10 mg total) by mouth 3 (three) times daily as needed for muscle spasms.      diclofenac 75 MG EC tablet   Commonly known as: VOLTAREN   Take 75 mg by mouth 2 (two) times daily.      finasteride 5 MG tablet   Commonly known as: PROSCAR   Take 5 mg by mouth daily.      HYDROcodone-acetaminophen 5-325 MG per tablet   Commonly known as: NORCO   Take 1-2 tablets by mouth every 4 (four) hours as needed.      mupirocin ointment 2 %   Commonly known as: BACTROBAN      omeprazole 20 MG capsule   Commonly known as: PRILOSEC   Take 1 tablet by mouth daily.      orphenadrine 100 MG tablet   Commonly known as: NORFLEX  Take 100 mg by mouth 2 (two) times daily.      oxyCODONE-acetaminophen 7.5-325 MG per tablet   Commonly known as: PERCOCET   Take 1 tablet by mouth every 4 (four) hours as needed. For pain      sorbitol 70 % solution   Take 30 mLs by mouth 2 (two) times daily. Decrease to daily when bowels become regular      Tamsulosin HCl 0.4 MG Caps   Commonly known as: FLOMAX   Take 1 tablet by mouth daily.           Follow-up Information    Follow up with Kahli Mayon A, MD. Call in 1 week.   Contact information:   [address], [address] [place] [address] [phone]           Signed: Temple Pacini 11/03/2011, 9:24 AM

## 2011-11-03 NOTE — Progress Notes (Signed)
IP PROGRESS NOTE  Subjective:   [name] is recovering from lumbar spine surgery. He reports an improvement in the back and leg pain. The right leg remains weak. He had a bowel movement while in the hospital.  Objective: Vital signs in last 24 hours: Blood pressure 100/62, pulse 62, temperature 97.8 F (36.6 C), temperature source Oral, resp. rate 20, height 6' 0.05" (1.83 m), weight 216 lb 4.3 oz (98.1 kg), SpO2 97.00%.  Intake/Output from previous day: 04/23 0701 - 04/24 0700 In: 1200 [P.O.:1200] Out: -   Physical Exam:   Lungs: Clear bilaterally, no respiratory distress Cardiac: Regular rate and rhythm Abdomen: Soft and nontender Extremities: No leg edema Neurologic: There is weakness of the right leg proximally and with extension at the right knee    Lab Results:  BMET  Basename 11/01/11 0936  NA 127*  K 4.1  CL 94*  CO2 29  GLUCOSE 84  BUN 31*  CREATININE 1.49*  CALCIUM 10.7*    Studies/Results: Dg Lumbar Spine 1 View  11/01/2011  *RADIOLOGY REPORT*  Clinical Data: Laminectomy and microdiskectomy.  LUMBAR SPINE - 1 VIEW  Comparison: Lumbar spine MR 10/19/2011.  Findings: Numbering system from 10/19/2011 is preserved.  Surgical instrument tip projects in the region of the L1-2 facet joints. Endplate degenerative changes are worst at L3-4, with associated loss of disc space height.  Overall osseous detail is degraded by technique.  IMPRESSION: Intraoperative localization, as above.  Original Report Authenticated By: [name], M.D.    Medications: I have reviewed the patient's current medications.  Assessment/Plan:  1. Status post lumbar decompression surgery surgery 11/01/2011  2. Right leg radiculopathy secondary to lumbar disc disease/spinal stenosis  3. Multiple myeloma with a recent rise in the serum M spike  4. Hypercalcemia on preoperative lab 11/01/2011  5. Constipation-likely related to narcotic analgesics versus hypercalcemia  6.  Renal insufficiency  [name] is recovering from lumbar spine surgery. There has been recent progression of the multiple myeloma. The myeloma may be causing renal insufficiency and hypercalcemia. The plan is to begin salvage therapy with Revlimid/Decadron. He will begin Revlimid as soon as he receives home delivery of this drug. He will take Decadron weekly.  Recommendations: 1. Repeat chemistry panel and ionized calcium this morning 2. Decadron and pamidronate prior to discharge if hypercalcemia is confirmed 3. Outpatient followup as scheduled at the [facility]    LOS: 2 days   [facility], [name]  11/03/2011, 7:20 AM

## 2011-11-04 NOTE — Progress Notes (Signed)
11/04/2011 Minetta Krisher Elizabeth PTA [phone] pager [phone] office    

## 2011-11-08 ENCOUNTER — Other Ambulatory Visit: Payer: Self-pay | Admitting: *Deleted

## 2011-11-08 ENCOUNTER — Emergency Department (HOSPITAL_COMMUNITY): Payer: Medicare Other

## 2011-11-08 ENCOUNTER — Inpatient Hospital Stay (HOSPITAL_COMMUNITY)
Admission: EM | Admit: 2011-11-08 | Discharge: 2011-11-10 | DRG: 312 | Disposition: A | Discharge status: Home / Self Care | Payer: Medicare Other | Attending: Family Medicine | Admitting: Family Medicine

## 2011-11-08 ENCOUNTER — Encounter (HOSPITAL_COMMUNITY): Payer: Self-pay

## 2011-11-08 ENCOUNTER — Ambulatory Visit (HOSPITAL_BASED_OUTPATIENT_CLINIC_OR_DEPARTMENT_OTHER): Payer: Medicare Other | Admitting: Lab

## 2011-11-08 DIAGNOSIS — Z880 Allergy status to penicillin: Secondary | ICD-10-CM

## 2011-11-08 DIAGNOSIS — M5116 Intervertebral disc disorders with radiculopathy, lumbar region: Secondary | ICD-10-CM

## 2011-11-08 DIAGNOSIS — C9000 Multiple myeloma not having achieved remission: Secondary | ICD-10-CM

## 2011-11-08 DIAGNOSIS — I951 Orthostatic hypotension: Principal | ICD-10-CM | POA: Diagnosis present

## 2011-11-08 DIAGNOSIS — E871 Hypo-osmolality and hyponatremia: Secondary | ICD-10-CM | POA: Diagnosis present

## 2011-11-08 DIAGNOSIS — N400 Enlarged prostate without lower urinary tract symptoms: Secondary | ICD-10-CM | POA: Diagnosis present

## 2011-11-08 DIAGNOSIS — I10 Essential (primary) hypertension: Secondary | ICD-10-CM | POA: Diagnosis present

## 2011-11-08 DIAGNOSIS — G4700 Insomnia, unspecified: Secondary | ICD-10-CM

## 2011-11-08 DIAGNOSIS — D649 Anemia, unspecified: Secondary | ICD-10-CM

## 2011-11-08 DIAGNOSIS — E785 Hyperlipidemia, unspecified: Secondary | ICD-10-CM | POA: Diagnosis present

## 2011-11-08 DIAGNOSIS — K219 Gastro-esophageal reflux disease without esophagitis: Secondary | ICD-10-CM | POA: Diagnosis present

## 2011-11-08 DIAGNOSIS — N289 Disorder of kidney and ureter, unspecified: Secondary | ICD-10-CM | POA: Diagnosis present

## 2011-11-08 DIAGNOSIS — G4733 Obstructive sleep apnea (adult) (pediatric): Secondary | ICD-10-CM

## 2011-11-08 DIAGNOSIS — Z87891 Personal history of nicotine dependence: Secondary | ICD-10-CM

## 2011-11-08 DIAGNOSIS — Z79899 Other long term (current) drug therapy: Secondary | ICD-10-CM

## 2011-11-08 DIAGNOSIS — Z7982 Long term (current) use of aspirin: Secondary | ICD-10-CM

## 2011-11-08 LAB — COMPREHENSIVE METABOLIC PANEL
ALT: 43 U/L (ref 0–53)
BUN: 26 mg/dL — ABNORMAL HIGH (ref 6–23)
CO2: 25 mEq/L (ref 19–32)
Calcium: 8.2 mg/dL — ABNORMAL LOW (ref 8.4–10.5)
Chloride: 98 mEq/L (ref 96–112)
Creatinine, Ser: 1.74 mg/dL — ABNORMAL HIGH (ref 0.50–1.35)

## 2011-11-08 LAB — URINALYSIS, ROUTINE W REFLEX MICROSCOPIC
Leukocytes, UA: NEGATIVE
Nitrite: NEGATIVE
Specific Gravity, Urine: 1.016 (ref 1.005–1.030)
Urobilinogen, UA: 0.2 mg/dL (ref 0.0–1.0)

## 2011-11-08 LAB — POCT I-STAT TROPONIN I: Troponin i, poc: 0 ng/mL (ref 0.00–0.08)

## 2011-11-08 LAB — CBC
MCH: 28.8 pg (ref 26.0–34.0)
Platelets: 174 10*3/uL (ref 150–400)
RBC: 4.13 MIL/uL — ABNORMAL LOW (ref 4.22–5.81)
WBC: 6.5 10*3/uL (ref 4.0–10.5)

## 2011-11-08 MED ORDER — SODIUM CHLORIDE 0.9 % IV BOLUS (SEPSIS)
500.0000 mL | Freq: Once | INTRAVENOUS | Status: AC
  Administered 2011-11-08: 500 mL via INTRAVENOUS

## 2011-11-08 MED ORDER — VANCOMYCIN HCL IN DEXTROSE 1-5 GM/200ML-% IV SOLN
1000.0000 mg | Freq: Once | INTRAVENOUS | Status: AC
  Administered 2011-11-08: 1000 mg via INTRAVENOUS
  Filled 2011-11-08 (×2): qty 200

## 2011-11-08 MED ORDER — DEXTROSE 5 % IV SOLN
1.0000 g | Freq: Two times a day (BID) | INTRAVENOUS | Status: DC
  Administered 2011-11-08: 1 g via INTRAVENOUS
  Filled 2011-11-08 (×4): qty 1

## 2011-11-08 NOTE — ED Notes (Signed)
Was sent to the ER by his Cancer doctor because of abnormal lab results. Pt denies any pain at present

## 2011-11-08 NOTE — ED Provider Notes (Signed)
History     CSN: [id number]  Arrival date & time 11/08/11  1628   First MD Initiated Contact with Patient 11/08/11 1701      Chief Complaint  Patient presents with  . Coagulation Disorder    (Consider location/radiation/quality/duration/timing/severity/associated sxs/prior treatment) HPI Patient with a history of multiple myeloma and hypertension presents with generalized fatigue and weakness as well as hypotension. He states that he was referred from the cancer Center today do to low blood pressure. Patient states that his fatigue and weakness have been ongoing for the past several weeks. He denies any chest pain or fainting but he has had lightheadedness upon standing which has been the same for several weeks. He has not had any fever or cough or vomiting or diarrhea. He recently had a neurosurgical procedure on his low back and states that his pain is much improved and that he has been recovering well from the surgery without any acute change in symptoms. He has been taking his blood pressure medications as prescribed and last took a dose of BP meds at 8 AM this morning. He has also been taking hydrocodone as needed for his pain. There no other associated systemic symptoms. There are no other alleviating or modifying factors.  He denies any blood or melena in his stool  Past Medical History  Diagnosis Date  . GERD (gastroesophageal reflux disease)   . Hypertension   . BPH (benign prostatic hyperplasia)   . Lumbar herniated disc L4-5  . Hyperlipidemia   . Atypical angina     june 2004  . Sleep apnea   . Multiple myeloma 02/19/2008    Past Surgical History  Procedure Date  . Neck surgery   . Lumbar laminectomy/decompression microdiscectomy 11/01/2011    Procedure: LUMBAR LAMINECTOMY/DECOMPRESSION MICRODISCECTOMY 2 LEVELS;  Surgeon: Temple Pacini, MD;  Location: [facility];  Service: Neurosurgery;  Laterality: Right;  Lumbar Laminectomy/Microdiscectomy Decompression Lumbar  Three-Four, Lumbar Four-Five     Family History  Problem Relation Age of Onset  . Adopted: Yes    History  Substance Use Topics  . Smoking status: Former Smoker    Types: Cigarettes  . Smokeless tobacco: Not on file  . Alcohol Use: No      Review of Systems ROS reviewed and all otherwise negative except for mentioned in HPI Allergies  Penicillins  Home Medications   Current Outpatient Rx  Name Route Sig Dispense Refill  . AMLODIPINE BESYLATE 2.5 MG PO TABS Oral Take 1 tablet by mouth daily.    . ASPIRIN 325 MG PO TBEC Oral Take 325 mg by mouth daily.    . ATORVASTATIN CALCIUM 10 MG PO TABS Oral Take 1 tablet by mouth Daily.    Marland Kitchen BYSTOLIC 5 MG PO TABS Oral Take 1 tablet by mouth daily.    . CYCLOBENZAPRINE HCL 10 MG PO TABS Oral Take 10 mg by mouth 3 (three) times daily as needed.    Marland Kitchen DICLOFENAC SODIUM 75 MG PO TBEC Oral Take 75 mg by mouth 2 (two) times daily.    Marland Kitchen FINASTERIDE 5 MG PO TABS Oral Take 5 mg by mouth daily.      Marland Kitchen HYDROCODONE-ACETAMINOPHEN 5-325 MG PO TABS Oral Take 1-2 tablets by mouth every 4 (four) hours as needed. For pain    . MUPIROCIN 2 % EX OINT Topical Apply 1 application topically 2 (two) times daily.     Marland Kitchen OMEPRAZOLE 20 MG PO CPDR Oral Take 1 tablet by mouth daily.    Marland Kitchen  SORBITOL 70 % PO SOLN Oral Take 30 mLs by mouth 2 (two) times daily. Decrease to daily when bowels become regular    . TAMSULOSIN HCL 0.4 MG PO CAPS Oral Take 1 tablet by mouth daily.      BP 116/75  Pulse 79  Temp(Src) 98.1 F (36.7 C) (Oral)  Resp 21  SpO2 94% Vitals reviewed Physical Exam Physical Examination: General appearance - alert, tired appearing, and in no distress Mental status - alert, oriented to person, place, and time Eyes - pupils equal and reactive, no conjunctival injection or sceral icterus Mouth - mucous membranes moist, pharynx normal without lesions Chest - clear to auscultation, no wheezes, rales or rhonchi, symmetric air entry Heart - normal rate,  regular rhythm, normal S1, S2, no murmurs, rubs, clicks or gallops Abdomen - soft, nontender, nondistended, no masses or organomegaly Neurological - alert, oriented, normal speech, strength 5/5 in extremities x , 4+ /5 in RLE- chronic and improving per patient after recent back surgery, sensation intact to light touch in extremities x 4, cranial nerves grossly intact Extremities - peripheral pulses normal, no pedal edema, no clubbing or cyanosis Skin - normal coloration and turgor, no rashes  ED Course  Procedures (including critical care time)   Date: 11/08/2011  Rate: 74  Rhythm: normal sinus rhythm  QRS Axis: normal  Intervals: normal  ST/T Wave abnormalities: normal  Conduction Disutrbances:none  Narrative Interpretation:   Old EKG Reviewed: none available compared to 03/14/11    Labs Reviewed  CBC - Abnormal; Notable for the following:    RBC 4.13 (*)    Hemoglobin 11.9 (*)    HCT 32.6 (*)    MCHC 36.5 (*)    All other components within normal limits  URINALYSIS, ROUTINE W REFLEX MICROSCOPIC  POCT I-STAT TROPONIN I   Dg Chest 2 View  11/08/2011  *RADIOLOGY REPORT*  Clinical Data: Coagulation disorder, former smoker, history hypertension, hoarseness, recent lumbar surgery  CHEST - 2 VIEW  Comparison: 03/14/2011  Findings: Normal heart size, mediastinal contours, and pulmonary vascularity. Bibasilar atelectasis versus early infiltrate, slightly greater on left. Upper lungs clear. No pleural effusion or pneumothorax. Central peribronchial thickening noted. Bones appear demineralized evidence of prior cervical spine surgery.  IMPRESSION: Bibasilar atelectasis versus early infiltrate, greater on left.  Original Report Authenticated By: Lollie Marrow, M.D.     1. Orthostatic hypotension   2. Renal insufficiency       MDM  Patient presenting with generalized weakness and hypotension. In working him up today in the emergency department his orthostatic vital signs were positive.  He also has some mild renal insufficiency. He has not any neck and his EKG is reassuring. His blood pressure was somewhat improved after a liter of IV fluids however he was still somewhat orthostatic in terms of his standing blood pressure. Patient was reticent to be admitted but I have had a long discussion with he and his wife who have agreed to be admitted overnight. There was a possibility of early infiltrate on chest x-ray and do to his recent hospitalization he was started on antibiotics for healthcare associated pneumonia. Patient admitted to triad service for further evaluation and management.        Ethelda Chick, MD 11/10/11 [phone]

## 2011-11-08 NOTE — ED Notes (Signed)
Jalesa RN on [phone] made aware that there are temporary orders for pt to go upstairs. Telemetry order in computer. RN made aware that she needs to page admitting when pt gets upstairs. RN stated that she does not need any additional orders.

## 2011-11-08 NOTE — ED Notes (Signed)
Sent by md for low hgb and low blood pressure.

## 2011-11-08 NOTE — ED Notes (Signed)
Pt refused In and Out Cath. EDP made aware. Will continue to monitor.

## 2011-11-08 NOTE — ED Notes (Signed)
Pt stated that he had a lumbar laminectomy on last Monday. He went to Cancer doctor today and they told him to come to the ED for abnormal labs. Pt is not in any pain or discomfort. No respiratory or cardiac distress. No neurological deficits. Pt refused pain medications. Will continue to monitor.

## 2011-11-08 NOTE — ED Notes (Signed)
RN unavailable at this time. Will continue to monitor.  

## 2011-11-08 NOTE — ED Notes (Signed)
Spoke with Dr. Karma Ganja about temporary orders, and she stated that we will have to wait until admitting comes to see pt prior to the pt going upstairs. Will continue to monitor.

## 2011-11-08 NOTE — ED Notes (Signed)
EDP at bedside with pt discussing plan of care.

## 2011-11-09 ENCOUNTER — Encounter: Payer: Self-pay | Admitting: *Deleted

## 2011-11-09 ENCOUNTER — Telehealth: Payer: Self-pay | Admitting: *Deleted

## 2011-11-09 DIAGNOSIS — E871 Hypo-osmolality and hyponatremia: Secondary | ICD-10-CM | POA: Diagnosis present

## 2011-11-09 DIAGNOSIS — N289 Disorder of kidney and ureter, unspecified: Secondary | ICD-10-CM | POA: Diagnosis present

## 2011-11-09 DIAGNOSIS — I951 Orthostatic hypotension: Principal | ICD-10-CM | POA: Diagnosis present

## 2011-11-09 LAB — BASIC METABOLIC PANEL
Calcium: 7.6 mg/dL — ABNORMAL LOW (ref 8.4–10.5)
Creatinine, Ser: 1.14 mg/dL (ref 0.50–1.35)
GFR calc non Af Amer: 61 mL/min — ABNORMAL LOW (ref >90)
Sodium: 132 mEq/L — ABNORMAL LOW (ref 135–145)

## 2011-11-09 LAB — CBC
MCH: 28.5 pg (ref 26.0–34.0)
MCV: 78 fL (ref 78.0–100.0)
Platelets: 164 10*3/uL (ref 150–400)
RBC: 3.72 MIL/uL — ABNORMAL LOW (ref 4.22–5.81)
RDW: 15.3 % (ref 11.5–15.5)
WBC: 6.6 10*3/uL (ref 4.0–10.5)

## 2011-11-09 LAB — CALCIUM, IONIZED: Calcium, Ion: 1.08 mmol/L — ABNORMAL LOW (ref 1.12–1.32)

## 2011-11-09 MED ORDER — ONDANSETRON HCL 4 MG PO TABS: 4.0000 mg | ORAL_TABLET | Freq: Four times a day (QID) | ORAL | Status: DC | PRN

## 2011-11-09 MED ORDER — ACETAMINOPHEN 650 MG RE SUPP: 650.0000 mg | Freq: Four times a day (QID) | RECTAL | Status: DC | PRN

## 2011-11-09 MED ORDER — ACETAMINOPHEN 325 MG PO TABS
650.0000 mg | ORAL_TABLET | Freq: Four times a day (QID) | ORAL | Status: DC | PRN
  Administered 2011-11-10: 650 mg via ORAL
  Filled 2011-11-09: qty 2

## 2011-11-09 MED ORDER — SENNA 8.6 MG PO TABS
1.0000 | ORAL_TABLET | Freq: Two times a day (BID) | ORAL | Status: DC
  Administered 2011-11-09 – 2011-11-10 (×4): 8.6 mg via ORAL
  Filled 2011-11-09 (×5): qty 1

## 2011-11-09 MED ORDER — DEXAMETHASONE 4 MG PO TABS
40.0000 mg | ORAL_TABLET | Freq: Once | ORAL | Status: DC
Start: 2011-11-09 — End: 2011-11-18

## 2011-11-09 MED ORDER — ATORVASTATIN CALCIUM 10 MG PO TABS
10.0000 mg | ORAL_TABLET | Freq: Every day | ORAL | Status: DC
  Administered 2011-11-09: 10 mg via ORAL
  Filled 2011-11-09 (×2): qty 1

## 2011-11-09 MED ORDER — TAMSULOSIN HCL 0.4 MG PO CAPS
0.4000 mg | ORAL_CAPSULE | Freq: Every day | ORAL | Status: DC
  Administered 2011-11-09 – 2011-11-10 (×2): 0.4 mg via ORAL
  Filled 2011-11-09 (×2): qty 1

## 2011-11-09 MED ORDER — DOCUSATE SODIUM 100 MG PO CAPS
100.0000 mg | ORAL_CAPSULE | Freq: Two times a day (BID) | ORAL | Status: DC
  Administered 2011-11-09 – 2011-11-10 (×4): 100 mg via ORAL
  Filled 2011-11-09 (×5): qty 1

## 2011-11-09 MED ORDER — ASPIRIN EC 325 MG PO TBEC
325.0000 mg | DELAYED_RELEASE_TABLET | Freq: Every day | ORAL | Status: DC
  Administered 2011-11-09 – 2011-11-10 (×2): 325 mg via ORAL
  Filled 2011-11-09 (×2): qty 1

## 2011-11-09 MED ORDER — PANTOPRAZOLE SODIUM 40 MG PO TBEC
40.0000 mg | DELAYED_RELEASE_TABLET | Freq: Every day | ORAL | Status: DC
  Administered 2011-11-09 – 2011-11-10 (×3): 40 mg via ORAL
  Filled 2011-11-09 (×3): qty 1

## 2011-11-09 MED ORDER — SODIUM CHLORIDE 0.9 % IV SOLN
INTRAVENOUS | Status: DC
  Administered 2011-11-09 – 2011-11-10 (×5): via INTRAVENOUS

## 2011-11-09 MED ORDER — LEVOFLOXACIN IN D5W 750 MG/150ML IV SOLN
750.0000 mg | INTRAVENOUS | Status: DC
  Administered 2011-11-09: 750 mg via INTRAVENOUS
  Filled 2011-11-09 (×2): qty 150

## 2011-11-09 MED ORDER — ZOLPIDEM TARTRATE 5 MG PO TABS
5.0000 mg | ORAL_TABLET | Freq: Every evening | ORAL | Status: DC | PRN
  Administered 2011-11-09: 5 mg via ORAL
  Filled 2011-11-09: qty 1

## 2011-11-09 MED ORDER — HEPARIN SODIUM (PORCINE) 5000 UNIT/ML IJ SOLN
5000.0000 [IU] | Freq: Three times a day (TID) | INTRAMUSCULAR | Status: DC
  Administered 2011-11-09 – 2011-11-10 (×4): 5000 [IU] via SUBCUTANEOUS
  Filled 2011-11-09 (×7): qty 1

## 2011-11-09 MED ORDER — FINASTERIDE 5 MG PO TABS
5.0000 mg | ORAL_TABLET | Freq: Every day | ORAL | Status: DC
  Administered 2011-11-09 – 2011-11-10 (×2): 5 mg via ORAL
  Filled 2011-11-09 (×2): qty 1

## 2011-11-09 MED ORDER — ENSURE COMPLETE PO LIQD
237.0000 mL | Freq: Every day | ORAL | Status: DC
Start: 2011-11-09 — End: 2011-11-10

## 2011-11-09 MED ORDER — ONDANSETRON HCL 4 MG/2ML IJ SOLN: 4.0000 mg | Freq: Four times a day (QID) | INTRAMUSCULAR | Status: DC | PRN

## 2011-11-09 MED ORDER — LENALIDOMIDE 10 MG PO CAPS: 10.0000 mg | ORAL_CAPSULE | Freq: Every day | ORAL | Status: DC

## 2011-11-09 MED ORDER — SODIUM CHLORIDE 0.9 % IJ SOLN
3.0000 mL | Freq: Two times a day (BID) | INTRAMUSCULAR | Status: DC
  Administered 2011-11-09: 3 mL via INTRAVENOUS

## 2011-11-09 NOTE — Progress Notes (Signed)
Pt stated he's on a cpap at home MD on call was notified & placed an order for it. Respiratory was notified of the order however since it was late the pt stated he would sleep without the cpap for the time being. Will continue to monitor the pt. Sanda Linger

## 2011-11-09 NOTE — Progress Notes (Signed)
Physical Therapy Evaluation  Past Medical History  Diagnosis Date  . GERD (gastroesophageal reflux disease)   . Hypertension   . BPH (benign prostatic hyperplasia)   . Lumbar herniated disc L4-5  . Hyperlipidemia   . Atypical angina     june 2004  . Sleep apnea   . Multiple myeloma 02/19/2008   Past Surgical History  Procedure Date  . Neck surgery   . Lumbar laminectomy/decompression microdiscectomy 11/01/2011    Procedure: LUMBAR LAMINECTOMY/DECOMPRESSION MICRODISCECTOMY 2 LEVELS;  Surgeon: Temple Pacini, MD;  Location: [facility];  Service: Neurosurgery;  Laterality: Right;  Lumbar Laminectomy/Microdiscectomy Decompression Lumbar Three-Four, Lumbar Four-Five     11/09/11 1000  PT Visit Information  Last PT Received On 11/09/11  Assistance Needed +1  PT Time Calculation  PT Start Time 1025  PT Stop Time 1050  PT Time Calculation (min) 25 min  Subjective Data  Subjective I was moving around great, except for this lightheadedness.    Patient Stated Goal To go home and stay home.    Precautions  Precautions Back;Fall  Precaution Comments Reviewed back precautions  Restrictions  Weight Bearing Restrictions No  Home Living  Lives With Spouse  Available Help at Discharge Family  Type of Home House  Home Access Stairs to enter  Entrance Stairs-Number of Steps 2  Home Layout One level  Bathroom Shower/Tub Tub/shower unit  Bathroom Toilet Handicapped height  Home Adaptive Equipment Bedside commode/3-in-1;Walker - rolling;Straight cane  Additional Comments wife reports concern for tolerance to bed at home.  Interested in hospital bed for home  Prior Function  Level of Independence Independent with assistive device(s)  Able to Take Stairs? Yes  Driving Yes  Communication  Communication No difficulties  Cognition  Overall Cognitive Status Appears within functional limits for tasks assessed/performed  Arousal/Alertness Awake/alert  Orientation Level Oriented X4 / Intact    Behavior During [facility] for tasks performed  Right Lower Extremity Assessment  RLE ROM/Strength/Tone Deficits  RLE ROM/Strength/Tone Deficits Hip flexion 2/5, knee extension 3-/5, ankle dorsiflexion 4/5; AAROM WFL  Left Lower Extremity Assessment  LLE ROM/Strength/Tone WFL for tasks assessed  Bed Mobility  Bed Mobility Rolling Right;Right Sidelying to Sit;Sitting - Scoot to Edge of Bed  Rolling Right 6: Modified independent (Device/Increase time)  Right Sidelying to Sit 6: Modified independent (Device/Increase time)  Sitting - Scoot to Edge of Bed 6: Modified independent (Device/Increase time)  Details for Bed Mobility Assistance Only needed increased time, demos log roll technique.    Transfers  Transfers Sit to Stand;Stand to Sit  Sit to Stand 5: Supervision;With upper extremity assist;From bed  Stand to Sit 5: Supervision;With upper extremity assist;With armrests;To chair/3-in-1  Details for Transfer Assistance Demos good use of UEs  Ambulation/Gait  Ambulation/Gait Assistance 5: Supervision  Ambulation Distance (Feet) 150 Feet  Assistive device Rolling walker  Ambulation/Gait Assistance Details mild flexed posture, demos good use of RW and safety with turns.  pt notes feeling a little dizzy, but notes improves during ambulation.    Gait Pattern Step-through pattern;Decreased stride length;Trunk flexed  Stairs No  Engineering geologist No  Balance  Balance Assessed No  PT - End of Session  Equipment Utilized During Treatment Gait belt  Activity Tolerance Patient tolerated treatment well  Patient left in chair;with call bell/phone within reach  Nurse Communication Mobility status  PT Assessment  Clinical Impression Statement pt presents with Orthostatic Hypotension and recent back surgery.  pt motivated and moving well, just limited  by dizziness.  BP supine 135/81, Sitting 109/68, and standing 101/66.    PT Recommendation/Assessment Patient needs  continued PT services  PT Problem List Decreased strength;Decreased activity tolerance;Decreased balance;Decreased mobility;Decreased knowledge of use of DME;Decreased knowledge of precautions;Pain  Barriers to Discharge None  PT Therapy Diagnosis  Difficulty walking  PT Plan  PT Frequency Min 3X/week  PT Treatment/Interventions DME instruction;Gait training;Stair training;Functional mobility training;Therapeutic activities;Therapeutic exercise;Balance training;Patient/family education  PT Recommendation  Follow Up Recommendations Home health PT;Supervision - Intermittent  Equipment Recommended None recommended by PT  Individuals Consulted  Consulted and Agree with Results and Recommendations Patient  Acute Rehab PT Goals  PT Goal Formulation With patient  Time For Goal Achievement 11/23/11  Potential to Achieve Goals Good  Pt will go Sit to Stand with modified independence  PT Goal: Sit to Stand - Progress Goal set today  Pt will go Stand to Sit with modified independence  PT Goal: Stand to Sit - Progress Goal set today  Pt will Ambulate >150 feet;with modified independence;with rolling walker  PT Goal: Ambulate - Progress Goal set today  Pt will Go Up / Down Stairs 1-2 stairs;with min assist;with rolling walker  PT Goal: Up/Down Stairs - Progress Goal set today  Additional Goals  Additional Goal #1 pt will verbalize and follow back precautions.    PT Goal: Additional Goal #1 - Progress Goal set today    Mack Hook, PT [phone]

## 2011-11-09 NOTE — H&P (Signed)
PCP:  Redmond Baseman, MD, MD  Dr. Truett Perna in oncology Dr. Lelon Perla in neurosurgery   Chief Complaint:  Dizziness with movement  HPI: [phone] with h/o multiple myeloma, lumbar herniated disc s/p surgery on 4/22 presents with  orthostatic hypoTN.   Pt was just admitted from 4/22 to 4/24 to Nsurgery where he had an uncomplicated right sided  L2-3 laminotomy and microdiscectomy, and right L3-4 compressive laminotomy. Back pain  improved. Noted to also have generalized weakness from his MM. Pt also noted to have recent  serum M spike and hyperCa on pre-op lab 4/22. Dr. Truett Perna consulted while admitted who states  he has progression of MM, may be causing the renal insufficiency and hyperCa, plan to begin  salvage therapy with Revlimid/Decadron.  Pt comes back stating that for the past while, unclear exactly how long, he's had dizziness worse  when he moves around and especially with getting up. This was ongoing when he was admitted  for the surgery but he also seems to indicate some longstanding chronicity to this problem as well,  possibly going back to the 90's and possibly previously attributed to medication effect. He has not  had LOC, SOB, CP, n/v, abd pain, palpitations. He got back from home and this appears to have  worsened especially in the past 3 nights, such that he came back to the ED.   In the ED, initial vitals with hypoTN to 87/52, also orthostatic from 116/65  p71 lying to 95/66   p75 sitting to 86/58  p80 standing. Most recently improved to 139/72  p87. Labs with hypoNa  129, renal 26/1.74. Tprot elevated at 10.6. Trop POC negative. CBC normal but all counts lower  than prior, Hgb 11.9 down from 13.5, and plts 174 down from 243. UA negative. CXR with  bibasilar atelectasis vs early infiltrate, greater on left. Pt has been given 1L of NS, Vanco,  cefepime.   ROS as above, otherwise pt with left hand swelling from what sounds like an infiltrated IV site  during  recent admission. It's still painful but he states it's overall doing down a lot. ROS otherwise  unremarakable.    Past Medical History  Diagnosis Date  . GERD (gastroesophageal reflux disease)   . Hypertension   . BPH (benign prostatic hyperplasia)   . Lumbar herniated disc L4-5  . Hyperlipidemia   . Atypical angina     june 2004  . Sleep apnea   . Multiple myeloma 02/19/2008    Past Surgical History  Procedure Date  . Neck surgery   . Lumbar laminectomy/decompression microdiscectomy 11/01/2011    Procedure: LUMBAR LAMINECTOMY/DECOMPRESSION MICRODISCECTOMY 2 LEVELS;  Surgeon: Temple Pacini, MD;  Location: [facility];  Service: Neurosurgery;  Laterality: Right;  Lumbar Laminectomy/Microdiscectomy Decompression Lumbar Three-Four, Lumbar Four-Five     Medications:  HOME MEDS: Reconciled by name with pt  Prior to Admission medications   Medication Sig Start Date End Date Taking? Authorizing Provider  amLODipine (NORVASC) 2.5 MG tablet Take 1 tablet by mouth daily. 08/28/11  Yes Historical Provider, MD  aspirin 325 MG EC tablet Take 325 mg by mouth daily.   Yes Historical Provider, MD  atorvastatin (LIPITOR) 10 MG tablet Take 1 tablet by mouth Daily. 02/21/11  Yes Historical Provider, MD  BYSTOLIC 5 MG tablet Take 1 tablet by mouth daily. 02/25/11  Yes Historical Provider, MD  cyclobenzaprine (FLEXERIL) 10 MG tablet Take 10 mg by mouth 3 (three) times daily as needed. 11/03/11 11/13/11 Yes Sherilyn Cooter  A Pool, MD  diclofenac (VOLTAREN) 75 MG EC tablet Take 75 mg by mouth 2 (two) times daily.   Yes Historical Provider, MD  finasteride (PROSCAR) 5 MG tablet Take 5 mg by mouth daily.     Yes Historical Provider, MD  HYDROcodone-acetaminophen (NORCO) 5-325 MG per tablet Take 1-2 tablets by mouth every 4 (four) hours as needed. For pain 11/03/11 11/13/11 Yes Temple Pacini, MD  mupirocin ointment (BACTROBAN) 2 % Apply 1 application topically 2 (two) times daily.  10/26/11  Yes Historical Provider, MD    omeprazole (PRILOSEC) 20 MG capsule Take 1 tablet by mouth daily. 08/28/11  Yes Historical Provider, MD  sorbitol 70 % solution Take 30 mLs by mouth 2 (two) times daily. Decrease to daily when bowels become regular 10/29/11  Yes Ladene Artist, MD  Tamsulosin HCl (FLOMAX) 0.4 MG CAPS Take 1 tablet by mouth daily. 02/12/11  Yes Historical Provider, MD    Allergies:  Allergies  Allergen Reactions  . Penicillins Rash    Social History:   reports that he has quit smoking. His smoking use included Cigarettes. He does not have any smokeless tobacco history on file. He reports that he does not drink alcohol or use illicit drugs. He lives at home with his wife and has grown children. He quit smoking almost 40 years ago.   Family History: Family History  Problem Relation Age of Onset  . Adopted: Yes    Physical Exam: Filed Vitals:   11/08/11 2245 11/08/11 2300 11/08/11 2330 11/09/11 0002  BP: 116/78 116/75 105/52 139/72  Pulse: 77 79 82 87  Temp:    97.9 F (36.6 C)  TempSrc:    Oral  Resp: 15 21 21 20   Height:    6' (1.829 m)  Weight:    97.977 kg (216 lb)  SpO2: 97% 94% 95% 96%   Blood pressure 139/72, pulse 87, temperature 97.9 F (36.6 C), temperature source Oral, resp. rate 20, height 6' (1.829 m), weight 97.977 kg (216 lb), SpO2 96.00%. Gen: Younger than stated age appearing M in hospital bed, overall stable and well appearing, able  to relate history moderately well, no distress, breathing comfortably HEENT: Pupils round and equal, EOMI, sclera clear overall, normal appearing. Tongue  moderately dry appearing but not parched, lips not parched.  Lungs: CTAB no w/c/r, good air movement, nromal overall Heart: Regular, not tachycardic, no m/g appreciated, normal exam Abd: Soft, not tender, not distended, normal exam, no facial grimacing Extrem: Hands and feet are bit cool but not cold or cyanotic, no BLE edema noted, L hand does  appear generally more puffy than right with minimal  tenderness, does not extend proximally, no  erythema or warmth. Radials a bit hard to palpate.  Neuro: Alert, attnetive, conversant, CN 2-12 intact, moves extremities on his own, grossly non- focal.    Labs & Imaging Results for orders placed during the hospital encounter of 11/08/11 (from the past 48 hour(s))  CBC     Status: Abnormal   Collection Time   11/08/11  5:41 PM      Component Value Range Comment   WBC 6.5  4.0 - 10.5 (K/uL)    RBC 4.13 (*) 4.22 - 5.81 (MIL/uL)    Hemoglobin 11.9 (*) 13.0 - 17.0 (g/dL)    HCT 16.1 (*) 09.6 - 52.0 (%)    MCV 78.9  78.0 - 100.0 (fL)    MCH 28.8  26.0 - 34.0 (pg)    MCHC 36.5 (*)  30.0 - 36.0 (g/dL)    RDW 16.1  09.6 - 04.5 (%)    Platelets 174  150 - 400 (K/uL)   POCT I-STAT TROPONIN I     Status: Normal   Collection Time   11/08/11  5:59 PM      Component Value Range Comment   Troponin i, poc 0.00  0.00 - 0.08 (ng/mL)    Comment 3            URINALYSIS, ROUTINE W REFLEX MICROSCOPIC     Status: Normal   Collection Time   11/08/11  7:26 PM      Component Value Range Comment   Color, Urine YELLOW  YELLOW     APPearance CLEAR  CLEAR     Specific Gravity, Urine 1.016  1.005 - 1.030     pH 5.5  5.0 - 8.0     Glucose, UA NEGATIVE  NEGATIVE (mg/dL)    Hgb urine dipstick NEGATIVE  NEGATIVE     Bilirubin Urine NEGATIVE  NEGATIVE     Ketones, ur NEGATIVE  NEGATIVE (mg/dL)    Protein, ur NEGATIVE  NEGATIVE (mg/dL)    Urobilinogen, UA 0.2  0.0 - 1.0 (mg/dL)    Nitrite NEGATIVE  NEGATIVE     Leukocytes, UA NEGATIVE  NEGATIVE  MICROSCOPIC NOT DONE ON URINES WITH NEGATIVE PROTEIN, BLOOD, LEUKOCYTES, NITRITE, OR GLUCOSE <1000 mg/dL.   Dg Chest 2 View  11/08/2011  *RADIOLOGY REPORT*  Clinical Data: Coagulation disorder, former smoker, history hypertension, hoarseness, recent lumbar surgery  CHEST - 2 VIEW  Comparison: 03/14/2011  Findings: Normal heart size, mediastinal contours, and pulmonary vascularity. Bibasilar atelectasis versus early  infiltrate, slightly greater on left. Upper lungs clear. No pleural effusion or pneumothorax. Central peribronchial thickening noted. Bones appear demineralized evidence of prior cervical spine surgery.  IMPRESSION: Bibasilar atelectasis versus early infiltrate, greater on left.  Original Report Authenticated By: Lollie Marrow, M.D.    ECG: NSR 74 bpm, normal axis, normal P and PR, lateral Q waves from V4-6, I, aVL. No current  ischemic ST deviations, and T waves all appear normal. No changes compared to prior.     Impression Present on Admission:  .Orthostatic hypotension .Renal insufficiency .Hyponatremia .Multiple myeloma  76yoM with h/o multiple myeloma, lumbar herniated disc s/p surgery on 4/22 presents with  orthostatic hypoTN.   1. Orthostasis: See below, suspect this is due to dehydration. HyperCa from progressive MM as  underlying etiology for this is considered, vs poor PO intake. Do not suspect infection, will NOT  continue ABx.  - IVF's overnight and trend orthostatics. Holding PO home BP medications, except will continue  tamsulosin. PT consult   2. HypoNa: Pt's Na's were in the 120's during 10/2011 admission, however pt's baseline appears to  be a normal value. This, in combination with renal fxn as below, suggests pt was dehydrated  going into the surgery.   3. Renal insufficiency: Pt's renal indices were higher than prior during 10/2011 admission as well,  with apparent baseline 20's / 1.1-1.2, however during admission was 30's / 1.4-1.5, and now up to  Cr 1.74. Considered dehydration, however pt also noted to have progression of MM and hyperCa,  however repeat Ca today shows normal value at 8.2, although albumin a bit lower as well.  - IVF's overnight as above, will get ionized Ca.   4. Declining blood counts: without frank pancytopenia. Hgb decrease may have been from  surgery. Will monitor this, trend CBC to ensure  this is real. Do not think pt has started his  chemo  for MM to explain this.   5. Left hand swelling: Pt states this started when he had in IV in that hand that they had to pull  out, and I can see a small IV insertion site. Suspect this was infiltration that is just taking awhile  to resolve. As discussed with pt, for now will just monitor this, expect it will get better within  another 1-2 weeks, but if not improved can consider ultrasound as outpt.   6. Holding various home pain medications, can add back as needed.   SubQ heparin Telemetry, [facility] team 8 Presumed full code  Other plans as per orders.   Lilo Wallington 11/09/2011, 1:17 AM

## 2011-11-09 NOTE — Progress Notes (Signed)
Utilization review completed. Laniah Grimm RN, CCM  

## 2011-11-09 NOTE — Progress Notes (Signed)
Reviewd [name]'s chart. Saw and examined him at bed side. I have added levaquin/incentive spirometer as suggestion of infiltrate versus atelectasis. Continue ivf, and management per DR Bonno.  Jerolyn Flenniken,Md pager#[phone].

## 2011-11-09 NOTE — Progress Notes (Signed)
RECEIVED A FAX FROM [facility] CONCERNING A CONFIRMATION OF FACSIMILE RECEIPT. 

## 2011-11-09 NOTE — Progress Notes (Signed)
INITIAL ADULT NUTRITION ASSESSMENT Date: 11/09/2011   Time: 2:38 PM  Reason for Assessment: Nutrition Risk Report  ASSESSMENT: Male 76 y.o.  Dx: Orthostatic hypotension  Hx:  Past Medical History  Diagnosis Date  . GERD (gastroesophageal reflux disease)   . Hypertension   . BPH (benign prostatic hyperplasia)   . Lumbar herniated disc L4-5  . Hyperlipidemia   . Atypical angina     june 2004  . Sleep apnea   . Multiple myeloma 02/19/2008    Related Meds:     . aspirin  325 mg Oral Daily  . atorvastatin  10 mg Oral q1800  . docusate sodium  100 mg Oral BID  . finasteride  5 mg Oral Daily  . heparin  5,000 Units Subcutaneous Q8H  . levofloxacin (LEVAQUIN) IV  750 mg Intravenous Q24H  . senna  1 tablet Oral BID  . sodium chloride  500 mL Intravenous Once  . sodium chloride  500 mL Intravenous Once  . sodium chloride  3 mL Intravenous Q12H  . Tamsulosin HCl  0.4 mg Oral Daily  . vancomycin  1,000 mg Intravenous Once  . DISCONTD: ceFEPime (MAXIPIME) IV  1 g Intravenous Q12H    Ht: 6' (182.9 cm)  Wt: 216 lb (97.977 kg)  Ideal Wt: 81 kg % Ideal Wt: 121%  Usual Wt: 226 lb % Usual Wt: 95%  Body mass index is 29.29 kg/(m^2).  Food/Nutrition Related Hx: unintentional weight loss > 10 lbs within the past month per admission nutrition screen  Labs:  CMP     Component Value Date/Time   NA 132* 11/09/2011 0650   K 3.8 11/09/2011 0650   CL 103 11/09/2011 0650   CO2 22 11/09/2011 0650   GLUCOSE 78 11/09/2011 0650   BUN 24* 11/09/2011 0650   CREATININE 1.14 11/09/2011 0650   CALCIUM 7.6* 11/09/2011 0650   PROT 10.6* 11/08/2011 1325   ALBUMIN 2.6* 11/08/2011 1325   AST 27 11/08/2011 1325   ALT 43 11/08/2011 1325   ALKPHOS 65 11/08/2011 1325   BILITOT 0.3 11/08/2011 1325   GFRNONAA 61* 11/09/2011 0650   GFRAA 71* 11/09/2011 0650     Intake/Output Summary (Last 24 hours) at 11/09/11 1439 Last data filed at 11/09/11 1340  Gross per 24 hour  Intake    300 ml  Output    825 ml   Net   -525 ml    Diet Order: Regular  Supplements/Tube Feeding: N/A  IVF:    sodium chloride Last Rate: 150 mL/hr at 11/09/11 3244    Estimated Nutritional Needs:   Kcal: 2000-2200 Protein: 100-110 gm Fluid: 2.1-2.3 L  RD spoke with patient re: nutrition hx -- reports his appetite has been variable prior to hospitalization; he was not eating very well prior to his back surgery (November 01, 2011) due to pain; reports an approximate 10 lb weight loss x 1 month (4.4% -- not significant for time frame); current PO intake is variable at 50-100% per flowsheet records; no skin breakdown; pt would like Ensure supplement daily to optimize nutritional status -- RD to order.  NUTRITION DIAGNOSIS: -Inadequate oral intake (NI-2.1).  Status: Ongoing  RELATED TO: variable appetite  AS EVIDENCE BY: PO intake 50-100%  MONITORING/EVALUATION(Goals): Goal: meet >90% of estimated nutrition needs  Monitor: PO & supplemental intake, weight, labs, I/O's  EDUCATION NEEDS: -No education needs identified at this time  INTERVENTION:  Add Ensure Complete daily (350 kcals, 13 gm protein per 8 fl oz bottle)  RD to follow for nutrition care plan  Dietitian #: [phone]  DOCUMENTATION CODES Per approved criteria  -Not Applicable    [name] 11/09/2011, 2:38 PM

## 2011-11-09 NOTE — Progress Notes (Signed)
Late entry for 11/08/11: Patient arrived with his wife via W/C to to over his Revlimid paper work. Reviewed side effects with him and wife and went over precautions per [employer] program. Consent signed.  Patient reports continued feeling of dizziness. BP sitting was 76/53 pulse 72. Reviewed with Dr. Truett Perna and told patient to hold all his BP meds until his PCP calls him with further instructions. Called office of [facility] and gave message of BP reading and his complaints of dizziness. We instructed him to stop his BP meds pending PCP call. Also noted he is on 2 meds for prostate.  Patient also wanted RN to look at his left hand that appears to be swollen from what looks to be a previous IV infiltration.

## 2011-11-09 NOTE — Telephone Encounter (Signed)
Notified wife that Revlimid script was sent to [facility] and that they will be calling them after insurance investigation to determine co pay. [facility] will direct them to co pay assistance if needed. Made her aware that his calcium level was OK yesterday and that one time Decadron dose has been sent to his local pharmacy to take while we await the Revlimid. She will pick this up tomorrow. Patient currently in hospital at [facility] "trying to get his blood up". Planning on discharge tomorrow. Reminded her to call [employer] and help Bernardo with his survey tomorrow.

## 2011-11-09 NOTE — Progress Notes (Signed)
   CARE MANAGEMENT NOTE 11/09/2011  Patient:  [name],[name]   Account Number:  [id number]  Date Initiated:  11/09/2011  Documentation initiated by:  Darlyne Russian  Subjective/Objective Assessment:   Patient admitted due to orthostatic hypotensive episode     Action/Plan:   Progression of care and discharge planning   Anticipated DC Date:  11/12/2011   Anticipated DC Plan:  HOME W HOME HEALTH SERVICES      DC Planning Services  CM consult      Choice offered to / List presented to:             Status of service:  In process, will continue to follow Medicare Important Message given?   (If response is "NO", the following Medicare IM given date fields will be blank) Date Medicare IM given:   Date Additional Medicare IM given:    Discharge Disposition:    Per UR Regulation:  Reviewed for med. necessity/level of care/duration of stay  If discussed at Long Length of Stay Meetings, dates discussed:    Comments:  11/09/11 Onnie Boer, RN, BSN 1525 PT WAS ADMITTED WITH ORTHOSTATIC HYPOTENSION.  PTA PT WAS AT HOME WITH HIS WIFE.  PT STATED THAT HE HAS A WALKER AND CPAP MACHINE AT HOME.  WILL F/U ON DC NEEDS  11/09/2011 1500 Darlyne Russian RN, CCM Utilization review completed.

## 2011-11-10 DIAGNOSIS — M7989 Other specified soft tissue disorders: Secondary | ICD-10-CM

## 2011-11-10 DIAGNOSIS — E871 Hypo-osmolality and hyponatremia: Secondary | ICD-10-CM

## 2011-11-10 DIAGNOSIS — I951 Orthostatic hypotension: Secondary | ICD-10-CM

## 2011-11-10 DIAGNOSIS — N179 Acute kidney failure, unspecified: Secondary | ICD-10-CM

## 2011-11-10 LAB — CBC
HCT: 28.5 % — ABNORMAL LOW (ref 39.0–52.0)
Hemoglobin: 10.4 g/dL — ABNORMAL LOW (ref 13.0–17.0)
MCH: 28.4 pg (ref 26.0–34.0)
MCHC: 36.5 g/dL — ABNORMAL HIGH (ref 30.0–36.0)

## 2011-11-10 LAB — COMPREHENSIVE METABOLIC PANEL
Alkaline Phosphatase: 55 U/L (ref 39–117)
BUN: 13 mg/dL (ref 6–23)
CO2: 21 mEq/L (ref 19–32)
GFR calc Af Amer: >90 mL/min (ref >90)
GFR calc non Af Amer: 82 mL/min — ABNORMAL LOW (ref >90)
Glucose, Bld: 97 mg/dL (ref 70–99)
Potassium: 3.9 mEq/L (ref 3.5–5.1)
Total Protein: 8.6 g/dL — ABNORMAL HIGH (ref 6.0–8.3)

## 2011-11-10 LAB — GLUCOSE, CAPILLARY: Glucose-Capillary: 111 mg/dL — ABNORMAL HIGH (ref 70–99)

## 2011-11-10 MED ORDER — LEVOFLOXACIN 750 MG PO TABS: 750.0000 mg | ORAL_TABLET | Freq: Every day | ORAL | Status: AC

## 2011-11-10 MED ORDER — ENSURE COMPLETE PO LIQD: 237.0000 mL | Freq: Every day | ORAL | Status: DC

## 2011-11-10 NOTE — Progress Notes (Signed)
Physical Therapy Treatment Patient Details Name: [name] MRN: [id number] DOB: January 19, 1936 Today's Date: 11/10/2011 Time: 9629-5284 PT Time Calculation (min): 17 min  PT Assessment / Plan / Recommendation Comments on Treatment Session  No orthostasis noted this session; Overall managing well    Follow Up Recommendations  Home health PT;Supervision - Intermittent    Equipment Recommendations  None recommended by PT    Frequency Min 3X/week   Plan Discharge plan remains appropriate;Frequency remains appropriate    Precautions / Restrictions Precautions Precautions: Back;Fall Precaution Comments: Reviewed back precautions   Pertinent Vitals/Pain Serial BPs as follows: Sitting                    113/70 HR 83 Standing                123/82 HR 97 Post amb/steps     137/96 HR107 Post seated rest     121/75  No reports of pain    Mobility  Transfers Transfers: Sit to Stand;Stand to Sit Sit to Stand: 5: Supervision;With upper extremity assist;From bed Stand to Sit: 5: Supervision;With upper extremity assist;With armrests;To chair/3-in-1 Details for Transfer Assistance: cues for back prec Ambulation/Gait Ambulation/Gait Assistance: 5: Supervision Ambulation Distance (Feet): 140 Feet Assistive device: Rolling walker Ambulation/Gait Assistance Details: Cues for self monitor for activity tol Gait Pattern: Step-through pattern;Decreased stride length;Trunk flexed General Gait Details: Cues for safety with steps Stairs: Yes Stairs Assistance: 4: Min guard Stairs Assistance Details (indicate cue type and reason): Cues for technique Stair Management Technique: No rails Number of Stairs: 2          PT Goals Acute Rehab PT Goals Time For Goal Achievement: 11/23/11 Potential to Achieve Goals: Good Pt will go Sit to Stand: with modified independence PT Goal: Sit to Stand - Progress: Progressing toward goal Pt will go Stand to Sit: with modified independence PT Goal: Stand to Sit  - Progress: Progressing toward goal Pt will Ambulate: >150 feet;with modified independence;with rolling walker PT Goal: Ambulate - Progress: Progressing toward goal Pt will Go Up / Down Stairs: 1-2 stairs;with min assist;with rolling walker PT Goal: Up/Down Stairs - Progress: Progressing toward goal Additional Goals Additional Goal #1: pt will verbalize and follow back precautions.   PT Goal: Additional Goal #1 - Progress: Progressing toward goal  Visit Information  Last PT Received On: 11/10/11 Assistance Needed: +1    Subjective Data  Subjective: Agreeable to amb; States is very familiar with back precautions and declined bed mobility Patient Stated Goal: To go home and stay home.     Cognition  Overall Cognitive Status: Appears within functional limits for tasks assessed/performed Arousal/Alertness: Awake/alert Orientation Level: Oriented X4 / Intact Behavior During Session: [facility] for tasks performed    Balance     End of Session PT - End of Session Activity Tolerance: Patient tolerated treatment well Patient left: in chair;with call bell/phone within reach Nurse Communication: Mobility status    Olen Pel [place], [place] [phone]  11/10/2011, 1:47 PM

## 2011-11-10 NOTE — Progress Notes (Signed)
Pt and family provided with discharge instructions, education and prescription for new meds. Pt and family both verbalize understanding and teachback information. Pt was provided with information on orthostatic hypotension and what he should do at home (ie, drink plenty of water). Pt  IV removed with tip intact. Heart monitor returned to front. No needs at this time. Pt leaving floor in wheelchair with wife for home with [facility] services. Ramond Craver, RN

## 2011-11-10 NOTE — Progress Notes (Signed)
   CARE MANAGEMENT NOTE 11/10/2011  Patient:  [name],[name]   Account Number:  [id number]  Date Initiated:  11/09/2011  Documentation initiated by:  Darlyne Russian  Subjective/Objective Assessment:   Patient admitted due to orthostatic hypotensive episode     Action/Plan:   Progression of care and discharge planning   Anticipated DC Date:  11/12/2011   Anticipated DC Plan:  HOME W HOME HEALTH SERVICES      DC Planning Services  CM consult      Choice offered to / List presented to:          [facility] arranged  HH-2 PT      [facility] agency  [facility]   Status of service:  Completed, signed off Medicare Important Message given?   (If response is "NO", the following Medicare IM given date fields will be blank) Date Medicare IM given:   Date Additional Medicare IM given:    Discharge Disposition:  HOME W HOME HEALTH SERVICES  Per UR Regulation:  Reviewed for med. necessity/level of care/duration of stay  If discussed at Long Length of Stay Meetings, dates discussed:    Comments:  11/10/11 Onnie Boer, RN, BSN 1420 PT RECOMMENDED HH PT.  PT ALREADY ACTIVE WITH [facility].  WILL RESUME AT DC.  PT IS SUPPOSE TO DC TO HOME TODAY.  11/09/11 Onnie Boer, RN, BSN 1525 PT WAS ADMITTED WITH ORTHOSTATIC HYPOTENSION.  PTA PT WAS AT HOME WITH HIS WIFE.  PT STATED THAT HE HAS A WALKER AND CPAP MACHINE AT HOME.  WILL F/U ON DC NEEDS  11/09/2011 1500 Darlyne Russian RN, CCM Utilization review completed.

## 2011-11-10 NOTE — Discharge Summary (Signed)
HOSPITAL DISCHARGE SUMMARY   @n   [name], 76 y.o., DOB [birth year]  Admission date: 11/08/2011 Discharge Date: 11/10/2011  Primary MD Redmond Baseman, MD, MD  Admitting Physician Devonne Doughty, MD  Admission Diagnosis  Orthostatic hypotension [458.0] Renal insufficiency [593.9] Coagulation disorder  Discharge Diagnoses:   No resolved problems to display.  Active Hospital Problems  Diagnoses Date Noted   . Orthostatic hypotension 11/09/2011   . Renal insufficiency 11/09/2011   . Hyponatremia 11/09/2011   . Multiple myeloma 02/19/2008     Resolved Hospital Problems  Diagnoses Date Noted Date Resolved    Past Medical History  Diagnosis Date  . GERD (gastroesophageal reflux disease)   . Hypertension   . BPH (benign prostatic hyperplasia)   . Lumbar herniated disc L4-5  . Hyperlipidemia   . Atypical angina     june 2004  . Sleep apnea   . Multiple myeloma 02/19/2008    Past Surgical History  Procedure Date  . Neck surgery   . Lumbar laminectomy/decompression microdiscectomy 11/01/2011    Procedure: LUMBAR LAMINECTOMY/DECOMPRESSION MICRODISCECTOMY 2 LEVELS;  Surgeon: Temple Pacini, MD;  Location: [facility];  Service: Neurosurgery;  Laterality: Right;  Lumbar Laminectomy/Microdiscectomy Decompression Lumbar Three-Four, Lumbar [facility] Course See H&P, Labs, Consult and Test reports for all details.   From H&P:   59yoM with h/o multiple myeloma, lumbar herniated disc s/p surgery on 4/22 presents with  orthostatic hypoTN.  Pt was just admitted from 4/22 to 4/24 to Nsurgery where he had an uncomplicated right sided  L2-3 laminotomy and microdiscectomy, and right L3-4 compressive laminotomy. Back pain  improved. Noted to also have generalized weakness from his MM. Pt also noted to have recent  serum M spike and hyperCa on pre-op lab 4/22. Dr. Truett Perna consulted while admitted who states  he has progression of MM, may be causing the renal insufficiency and  hyperCa, plan to begin  salvage therapy with Revlimid/Decadron.   Pt comes back stating that for the past while, unclear exactly how long, he's had dizziness worse  when he moves around and especially with getting up. This was ongoing when he was admitted  for the surgery but he also seems to indicate some longstanding chronicity to this problem as well,  possibly going back to the 90's and possibly previously attributed to medication effect. He has not  had LOC, SOB, CP, n/v, abd pain, palpitations. He got back from home and this appears to have  worsened especially in the past 3 nights, such that he came back to the ED.   In the ED, initial vitals with hypoTN to 87/52, also orthostatic from 116/65 p71 lying to 95/66  p75 sitting to 86/58 p80 standing. Most recently improved to 139/72 p87. Labs with hypoNa  129, renal 26/1.74. Tprot elevated at 10.6. Trop POC negative. CBC normal but all counts lower  than prior, Hgb 11.9 down from 13.5, and plts 174 down from 243. UA negative. CXR with  bibasilar atelectasis vs early infiltrate, greater on left. Pt had been given 1L of NS, Vanco,  cefepime.   Pt did very well with IVF hydration and returned to baseline rather quickly.   He was started on Levaquin  750 mg daily for 5 days total for possible pneumonia seen on admission CXR.  He should complete that course.  He will follow up with his neurosurgeon tomorrow and with his PCP and oncologist in next few days for  Recheck.  He was  discharged home in stable condition with close follow up.  He was given instructions to return if  Symptoms recur, worsen or new problems develop.     No resolved problems to display.  Active Hospital Problems  Diagnoses Date Noted   . Orthostatic hypotension 11/09/2011   . Renal insufficiency 11/09/2011   . Hyponatremia 11/09/2011   . Multiple myeloma 02/19/2008     Resolved Hospital Problems  Diagnoses Date Noted Date Resolved     Today's Assessment:    Subjective:   [name]  Pt asking to go home.  He is ambulating fine with no problems.  He was seen by PT and they have recommended that he continue his HH PT.    Objective:   Blood pressure 133/71, pulse 76, temperature 98.8 F (37.1 C), temperature source Oral, resp. rate 20, height 6' (1.829 m), weight 101.107 kg (222 lb 14.4 oz), SpO2 93.00%. No intake or output data in the 24 hours ending 11/10/11 1451  Exam Gen - awake, alert, no distress Lungs - BBS clear CV - normal s1, s2 sounds  Abd - soft, nondistended, no masses Ext - mild edema of left hand   Lab Results  Component Value Date   WBC 5.6 11/10/2011   WBC 5.1 07/23/2011   HGB 10.4* 11/10/2011   HGB 12.4* 07/23/2011   HCT 28.5* 11/10/2011   HCT 34.1* 07/23/2011   PLT 155 11/10/2011   PLT 210 07/23/2011   LYMPHOPCT 24 10/26/2011   LYMPHOPCT 44.8 07/23/2011   BANDSPCT 0 08/15/2009   MONOPCT 5 10/26/2011   MONOPCT 7.0 07/23/2011   EOSPCT 2 10/26/2011   EOSPCT 2.3 07/23/2011   EOSPCT 2 08/02/2008   BASOPCT 0 10/26/2011   BASOPCT 0.8 07/23/2011   CMP:  Lab Results  Component Value Date   NA 130* 11/10/2011   K 3.9 11/10/2011   CL 104 11/10/2011   CO2 21 11/10/2011   BUN 13 11/10/2011   CREATININE 0.90 11/10/2011   PROT 8.6* 11/10/2011   ALBUMIN 2.1* 11/10/2011   BILITOT 0.2* 11/10/2011   ALKPHOS 55 11/10/2011   AST 26 11/10/2011   ALT 37 11/10/2011  .  DISCHARGE MEDICATION: Medication List  As of 11/10/2011  2:51 PM   STOP taking these medications         diclofenac 75 MG EC tablet         TAKE these medications         amLODipine 2.5 MG tablet   Commonly known as: NORVASC   Take 1 tablet by mouth daily.      aspirin 325 MG EC tablet   Take 325 mg by mouth daily.      atorvastatin 10 MG tablet   Commonly known as: LIPITOR   Take 1 tablet by mouth Daily.      BYSTOLIC 5 MG tablet   Generic drug: nebivolol   Take 1 tablet by mouth daily.      cyclobenzaprine 10 MG tablet   Commonly known as: FLEXERIL   Take 10 mg by mouth 3  (three) times daily as needed.      dexamethasone 4 MG tablet   Commonly known as: DECADRON   Take 10 tablets (40 mg total) by mouth once.      feeding supplement Liqd   Take 237 mLs by mouth daily at 3 pm.      finasteride 5 MG tablet   Commonly known as: PROSCAR   Take 5 mg by mouth daily.  HYDROcodone-acetaminophen 5-325 MG per tablet   Commonly known as: NORCO   Take 1-2 tablets by mouth every 4 (four) hours as needed. For pain      lenalidomide 10 MG capsule   Commonly known as: REVLIMID   Take 1 capsule (10 mg total) by mouth at bedtime. Take for 21 days, then 7 day rest  [id number]    Adult male      levofloxacin 750 MG tablet   Commonly known as: LEVAQUIN   Take 1 tablet (750 mg total) by mouth daily.      mupirocin ointment 2 %   Commonly known as: BACTROBAN   Apply 1 application topically 2 (two) times daily.      omeprazole 20 MG capsule   Commonly known as: PRILOSEC   Take 1 tablet by mouth daily.      sorbitol 70 % solution   Take 30 mLs by mouth 2 (two) times daily. Decrease to daily when bowels become regular      Tamsulosin HCl 0.4 MG Caps   Commonly known as: FLOMAX   Take 1 tablet by mouth daily.            Disposition and Follow-up: Discharge Orders    Future Appointments: Provider: Department: Dept Phone: Center:   11/18/2011 11:45 AM Rana Snare, NP [facility] [phone] None   11/29/2011 4:00 PM Waymon Budge, MD [facility] [phone] None     Future Orders Please Complete By Expires   Increase activity slowly        Follow-up Information    Follow up with Redmond Baseman, MD in 1 week.   Contact information:   [address] [place] [address] [place] [address] [phone]       Follow up with Thornton Papas, MD in 5 days.   Contact information:   [address] [place] [address] [phone]       Follow up with Neurosurgeon. (Follow up tomorrow  as scheduled)         The risks, benefits, and possible side effects of all treatments and tests were explained to the patient.  The patient verbalized understanding.  The importance of close follow up with the primary care medical provider was explained clearly to the patient.  The patient verbalized understanding.  The patient was given instructions to return if symptoms recur, worsen or new changes develop.  The patient verbalized understanding.   Cleora Fleet, MD, CDE, FAAFP [facility] [place], [place]   Total Time spent reviewing critical document, reviewing this patient's comprehensive hospitalization, arranging follow up and coordination of care, reviewing data and todays exam greater than 35 minutes.   Signed: Maynor Mwangi Laural Benes 11/10/2011 2:51 PM

## 2011-11-10 NOTE — Discharge Instructions (Signed)
Orthostatic Hypotension Orthostatic hypotension is a sudden fall in blood pressure. It occurs when a person goes from a sitting or lying position to a standing position. CAUSES   Loss of body fluids (dehydration).   Medicines that lower blood pressure.   Sudden changes in posture, such as sudden standing when you have been sitting or lying down.   Taking too much of your medicine.  SYMPTOMS   Lightheadedness or dizziness.   Fainting or near-fainting.   A fast heart rate (tachycardia).   Weakness.   Feeling tired (fatigue).  DIAGNOSIS  Your caregiver may find the cause of orthostatic hypotension through:  A history and/or physical exam.   Checking your blood pressure. Your caregiver will check your blood pressure when you are:   Lying down.   Sitting.   Standing.   Tilt table testing. In this test, you are placed on a table that goes from a lying position to a standing position. You will be strapped to the table. This test helps to monitor your blood pressure and heart rate when you are in different positions.  TREATMENT   If orthostatic hypotension is caused by your medicines, your caregiver will need to adjust your dosage. Do not stop or adjust your medicine on your own.   When changing positions, make these changes slowly. This allows your body to adjust to the different position.   Compression stockings that are worn on your lower legs may be helpful.   Your caregiver may have you consume extra salt. Do not add extra salt to your diet unless directed by your caregiver.   Eat frequent, small meals. Avoid sudden standing after eating.   Avoid hot showers or excessive heat.   Your caregiver may give you fluids through the vein (intravenous).   Your caregiver may put you on medicine to help enhance fluid retention.  SEEK IMMEDIATE MEDICAL CARE IF:   You faint or have a near-fainting episode. Call your local emergency services (911 in [place]   You have or  develop chest pain.   You feel sick to your stomach (nauseous) or vomit.   You have a loss of feeling or movement in your arms or legs.   You have difficulty talking, slurred speech, or you are unable to talk.   You have difficulty thinking or have confused thinking.  MAKE SURE YOU:   Understand these instructions.   Will watch your condition.   Will get help right away if you are not doing well or get worse.  Document Released: 06/18/2002 Document Revised: 06/17/2011 Document Reviewed: 10/11/2008 [facility] Patient Information 2012 [place], [place].  Dehydration, Adult Dehydration is when you lose more fluids from the body than you take in. Vital organs like the kidneys, brain, and heart cannot function without a proper amount of fluids and salt. Any loss of fluids from the body can cause dehydration.  CAUSES   Vomiting.   Diarrhea.   Excessive sweating.   Excessive urine output.   Fever.  SYMPTOMS  Mild dehydration  Thirst.   Dry lips.   Slightly dry mouth.  Moderate dehydration  Very dry mouth.   Sunken eyes.   Skin does not bounce back quickly when lightly pinched and released.   Dark urine and decreased urine production.   Decreased tear production.   Headache.  Severe dehydration  Very dry mouth.   Extreme thirst.   Rapid, weak pulse (more than 100 beats per minute at rest).   Cold hands and feet.  Not able to sweat in spite of heat and temperature.   Rapid breathing.   Blue lips.   Confusion and lethargy.   Difficulty being awakened.   Minimal urine production.   No tears.  DIAGNOSIS  Your caregiver will diagnose dehydration based on your symptoms and your exam. Blood and urine tests will help confirm the diagnosis. The diagnostic evaluation should also identify the cause of dehydration. TREATMENT  Treatment of mild or moderate dehydration can often be done at home by increasing the amount of fluids that you drink. It is best to  drink small amounts of fluid more often. Drinking too much at one time can make vomiting worse. Refer to the home care instructions below. Severe dehydration needs to be treated at the hospital where you will probably be given intravenous (IV) fluids that contain water and electrolytes. HOME CARE INSTRUCTIONS   Ask your caregiver about specific rehydration instructions.   Drink enough fluids to keep your urine clear or pale yellow.   Drink small amounts frequently if you have nausea and vomiting.   Eat as you normally do.   Avoid:   Foods or drinks high in sugar.   Carbonated drinks.   Juice.   Extremely hot or cold fluids.   Drinks with caffeine.   Fatty, greasy foods.   Alcohol.   Tobacco.   Overeating.   Gelatin desserts.   Wash your hands well to avoid spreading bacteria and viruses.   Only take over-the-counter or prescription medicines for pain, discomfort, or fever as directed by your caregiver.   Ask your caregiver if you should continue all prescribed and over-the-counter medicines.   Keep all follow-up appointments with your caregiver.  SEEK MEDICAL CARE IF:  You have abdominal pain and it increases or stays in one area (localizes).   You have a rash, stiff neck, or severe headache.   You are irritable, sleepy, or difficult to awaken.   You are weak, dizzy, or extremely thirsty.  SEEK IMMEDIATE MEDICAL CARE IF:    You are unable to keep fluids down or you get worse despite treatment.   You have frequent episodes of vomiting or diarrhea.   You have blood or green matter (bile) in your vomit.   You have blood in your stool or your stool looks black and tarry.   You have not urinated in 6 to 8 hours, or you have only urinated a small amount of very dark urine.   You have a fever.   You faint.  MAKE SURE YOU:   Understand these instructions.   Will watch your condition.   Will get help right away if you are not doing well or get worse.    Document Released: 06/28/2005 Document Revised: 06/17/2011 Document Reviewed: 02/15/2011  [facility] Patient Information 2012 [place], [place].

## 2011-11-11 ENCOUNTER — Telehealth: Payer: Self-pay | Admitting: *Deleted

## 2011-11-11 ENCOUNTER — Encounter: Payer: Self-pay | Admitting: *Deleted

## 2011-11-11 NOTE — Telephone Encounter (Signed)
Received confirmation of revlimid shipment on 11/10/11 for delivery next business day.

## 2011-11-11 NOTE — Telephone Encounter (Signed)
Took his decadron yesterday. His Revlimid arrived today with $7.00 co pay. Asking when to start? Begin tonight per Dr. Truett Perna.

## 2011-11-18 ENCOUNTER — Other Ambulatory Visit: Payer: Self-pay | Admitting: *Deleted

## 2011-11-18 ENCOUNTER — Ambulatory Visit (HOSPITAL_BASED_OUTPATIENT_CLINIC_OR_DEPARTMENT_OTHER): Payer: Medicare Other | Admitting: Nurse Practitioner

## 2011-11-18 DIAGNOSIS — R42 Dizziness and giddiness: Secondary | ICD-10-CM

## 2011-11-18 DIAGNOSIS — C9000 Multiple myeloma not having achieved remission: Secondary | ICD-10-CM

## 2011-11-18 DIAGNOSIS — D649 Anemia, unspecified: Secondary | ICD-10-CM

## 2011-11-18 DIAGNOSIS — M549 Dorsalgia, unspecified: Secondary | ICD-10-CM

## 2011-11-18 MED ORDER — DEXAMETHASONE 4 MG PO TABS: 40.0000 mg | ORAL_TABLET | ORAL | Status: DC

## 2011-11-18 NOTE — Progress Notes (Signed)
OFFICE PROGRESS NOTE  Interval history:  [name] returns as scheduled. He began Revlimid 10 mg daily for 21 days on 11/11/2011. He underwent right L2-3 decompressive laminotomy with right L2 and L3 decompressive foraminotomies and right L2-3 microdiscectomy by Dr. Jordan [name] on 11/01/2011. He denies back pain. He continues to note proximal right leg weakness.  [name] was hospitalized 11/08/2011 through 11/10/2011 with orthostatic hypotension/dizziness. He received intravenous hydration with improvement. He was started on a course of Levaquin for possible pneumonia on admission chest x-ray.  [name] continues to have intermittent dizziness. This is a chronic problem for him.  He denies nausea/vomiting. No mouth sores. No diarrhea. He is intermittently constipated. He denies numbness or tingling in his hands or feet. He has occasional shortness of breath.   Objective: Blood pressure 108/65, pulse 89, temperature 97 F (36.1 C), temperature source Oral, height 6' (1.829 m), weight 225 lb 11.2 oz (102.377 kg).  Oropharynx is without thrush or ulceration. Lungs with faint inspiratory rales at the right base. Regular cardiac rhythm. Abdomen is soft and nontender. No organomegaly. Extremities are without edema. 4/5 strength at the proximal right leg.  Lab Results: Lab Results  Component Value Date   WBC 5.6 11/10/2011   HGB 10.4* 11/10/2011   HCT 28.5* 11/10/2011   MCV 77.9* 11/10/2011   PLT 155 11/10/2011    Chemistry:    Chemistry      Component Value Date/Time   NA 130* 11/10/2011 0521   K 3.9 11/10/2011 0521   CL 104 11/10/2011 0521   CO2 21 11/10/2011 0521   BUN 13 11/10/2011 0521   CREATININE 0.90 11/10/2011 0521      Component Value Date/Time   CALCIUM 7.4* 11/10/2011 0521   ALKPHOS 55 11/10/2011 0521   AST 26 11/10/2011 0521   ALT 37 11/10/2011 0521   BILITOT 0.2* 11/10/2011 0521       Studies/Results: Dg Chest 2 View  11/08/2011  *RADIOLOGY REPORT*  Clinical Data: Coagulation disorder, former  smoker, history hypertension, hoarseness, recent [name] surgery  CHEST - 2 VIEW  Comparison: 03/14/2011  Findings: Normal heart size, mediastinal contours, and pulmonary vascularity. Bibasilar atelectasis versus early infiltrate, slightly greater on left. Upper lungs clear. No pleural effusion or pneumothorax. Central peribronchial thickening noted. Bones appear demineralized evidence of prior cervical spine surgery.  IMPRESSION: Bibasilar atelectasis versus early infiltrate, greater on left.  Original Report Authenticated By: [name], M.D.   [name] Spine Wo Contrast  10/20/2011  *RADIOLOGY REPORT*  Clinical Data: Low back pain.  Pain and weakness in the right leg.  MRI [name] SPINE WITHOUT CONTRAST  Technique:  Multiplanar and multiecho pulse sequences of the [name] spine were obtained without intravenous contrast.  Comparison: 10/09/2011  Findings: The lowest full intervertebral disk space is labeled L5- S1.  If procedural intervention is to be performed, careful correlation with this numbering strategy is recommended.  The conus medullaris appears unremarkable.  Conus level:  L1.  There is 2 mm of degenerative posterior subluxation of L2-1 L3.  Small hemangioma is noted at the T12, L1, and L2 levels.  There is prominent loss of disc space at L3-4 and L4-5, with type 3 degenerative endplate findings at L3-4 and type 2 degenerative endplate findings of L4-5.  Vacuum disc phenomenon noted at both levels.  The pedicles in the mid to lower [name] spine appear mildly congenitally short.  Disc desiccation noted at L3-4 and L4-5, and to a lesser extent at L2-3.  We  partially imaged a 2.3 cm lesion with low T2 and intermediate to high T1 signal posteriorly in the right mid kidney, as shown on recent CT scan of 10/09/2011.  This is probably a complex cyst, although technically nonspecific due to partial characterization and lack of IV contrast. However, this lesion was then present back on studies through 2010.   Additional findings at individual levels are as follows:  T12-L1:  Unremarkable.  L1-2:  Minimal facet arthropathy.  No impingement.  L2-3:  Disc bulge noted with facet arthropathy and right greater than left facet effusions.  There is also abnormal disc material in the right lateral recess extending caudad from this level, probably representing a large disc fragment or disc extrusion, and significantly impinging on the right L3 nerve roots in the lateral recess.  This disc fragment is visible on image 70 of series 3. The AP diameter of the thecal sac is narrowed to 7 mm, compatible with moderate central stenosis.  There is annular tearing in the left neural for foramen and left lateral extraforaminal space, where the underlying disc bulge abuts the left L2 nerve.  L3-4:  Disc bulge, facet arthropathy, and ligament flavum redundancy noted. The AP diameter of the thecal sac is narrowed to 6 mm compatible with prominent central stenosis.  Right foraminal disc protrusion noted with intervertebral and facet spurring causing prominent right foraminal stenosis.  Facets spurring causes moderate left foraminal stenosis.  There is mild right and borderline left subarticular lateral recess stenosis.  L4-5:  Disc osteophyte complex noted with left greater than right facet arthropathy and small right facet effusion. The AP diameter of the thecal sac is narrowed to 5 mm compatible with severe central stenosis.  There is mild left and borderline right foraminal stenosis as well as mild left subarticular lateral recess stenosis.  L5-S1:  No impingement.  IMPRESSION:  1.  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  The most severe areas of impingement include impingement on the right L3 nerve roots due to a large disc fragment extending caudad from the L2-3 level in the right lateral recess; as well as the prominent central and right foraminal stenosis at L3-4 and the  prominent central stenosis at L4-5.  Other lesser degrees of impingement are noted as referenced above. 2.  Complex cystic lesion of the right mid kidney posteriorly is partially characterized on today's exam, and similar appearance to the recent CT scan from 10/09/2011.  Original Report Authenticated By: Dellia Cloud, M.D.   Dg [name] Spine 1 View  11/01/2011  *RADIOLOGY REPORT*  Clinical Data: Laminectomy and microdiskectomy.  [name] SPINE - 1 VIEW  Comparison: [name] spine MR 10/19/2011.  Findings: Numbering system from 10/19/2011 is preserved.  Surgical instrument tip projects in the region of the L1-2 facet joints. Endplate degenerative changes are worst at L3-4, with associated loss of disc space height.  Overall osseous detail is degraded by technique.  IMPRESSION: Intraoperative localization, as above.  Original Report Authenticated By: Reyes Ivan, M.D.    Medications: I have reviewed the patient's current medications.  Assessment/Plan:  1. Multiple myeloma, status post treatment with melphalan/prednisone/thalidomide beginning in November 2009. The thalidomide was increased to 200 mg daily beginning 09/11/2008. The serum M spike was slightly improved on 03/18/2009 and slightly increased on 05/09/2009. He has been maintained off of specific therapy for multiple myeloma since September 2010. The serum M spike is now higher. He began Revlimid on 11/11/2011. He  will begin weekly dexamethasone. 2. Chronic "dizziness." 3. Chest x-ray 11/01/2008 with a patchy opacity at the right midlung suspicious for pneumonia. He was treated with Avelox. 4. Low back pain with a radicular component. An MRI on 10/07/2008 showed no involvement of the lumbosacral spine with myeloma. 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 5. Bilateral neck pain, status post a CT 05/17/2008 with findings of spinal stenosis and  osteoarthritis. 6. 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 7. Red cell microcytosis, likely related to hemoglobin C trait. A ferritin level returned elevated and a stool Hemoccult was negative 01/23/2010. He underwent a colonoscopy in 2009. 8. Elevated beta-2 microglobulin level. 9. History of hypertension. 10. History of a herniated disk at L4-L5 on MRI an 01/11/2002. 11. Hyperlipidemia. 12. Gastroesophageal reflux disease. 13. History of atypical angina. 14. History of rectal bleeding. 15. Superficial venous thrombosis of the greater saphenous vein on a Doppler 09/27/2008. Negative for deep vein thrombosis. Symptoms improved with aspirin. 16. Neck pain with numbness/tingling in the arms, hands, and low back with proximal right leg weakness. An MRI of the cervical spine 05/30/2009 showed multilevel spondylosis with mild cord edema at C4-C5 and C5-C6. He was noted to have an enhancing lesion of the cord at T3-T4 of unclear etiology. In the [name] spine there was no change in the spondylosis at L3-L4 and L4-L5. There was no involvement of the cervical or [name] spine with myeloma. He is status post C4-C5, C5-C6, and C6-C7 anterior cervical diskectomy with fusion by Dr. Jordan [name] 08/01/2009. 17. History of mild thrombocytopenia. 18. Intermittent indeterminate-age deep vein thrombosis of the left lower extremity on a venous Doppler 08/15/2009. There were also findings consistent with superficial thrombosis involving the right lower extremity 08/15/2009. Coumadin was discontinued in October 2011. 19. Type 2 diabetes. 20. History of hematuria, followed at [facility]. Urinalysis was negative for blood on 07/23/2011. 21. Radicular back pain with MRI of the [name] spine on 10/19/2011 showing nerve root impingement at L2-L3, L3-L4 and L4-L5 with the most severe at the right L3 nerve roots due to a large disc fragment. Associated right leg weakness. He underwent  right L2-3 decompressive laminotomy with right L2 and L3 decompressive foraminotomy; right L2-3 microdiscectomy. 22. Constipation. Bowels moving. He continues a laxative regimen. 23. Hospitalization 11/08/2011 through 11/10/2011 with orthostatic hypotension/dizziness. He improved with intravenous hydration. He continues to have mild hypotension. Dr. Truett Perna recommends discontinuing Norvasc and Proscar. 24. Hypercalcemia status post pamidronate 11/03/2011.  Disposition-[name] will continue Revlimid and weekly dexamethasone. He will return for a followup visit on 12/07/2011. He will contact the office in the interim with any problems.  Plan reviewed with Dr. Truett Perna.  Lonna Cobb ANP/GNP-BC

## 2011-11-18 NOTE — Telephone Encounter (Signed)
Gave pt appt calendar appt for 12/07/11 lab and ML

## 2011-11-29 ENCOUNTER — Encounter: Payer: Self-pay | Admitting: Internal Medicine

## 2011-11-29 ENCOUNTER — Ambulatory Visit (INDEPENDENT_AMBULATORY_CARE_PROVIDER_SITE_OTHER): Payer: Medicare Other | Admitting: Internal Medicine

## 2011-11-29 VITALS — BP 114/76 | HR 91 | Ht 72.0 in | Wt 228.0 lb

## 2011-11-29 DIAGNOSIS — G4700 Insomnia, unspecified: Secondary | ICD-10-CM

## 2011-11-29 DIAGNOSIS — G4730 Sleep apnea, unspecified: Secondary | ICD-10-CM

## 2011-11-29 DIAGNOSIS — G4733 Obstructive sleep apnea (adult) (pediatric): Secondary | ICD-10-CM

## 2011-11-29 MED ORDER — ZOLPIDEM TARTRATE 10 MG PO TABS: 5.0000 mg | ORAL_TABLET | Freq: Every evening | ORAL | Status: DC | PRN

## 2011-11-29 NOTE — Progress Notes (Signed)
Patient ID: [name], male    DOB: September 29, 1935, 76 y.o.   MRN: [id number]  HPI 03/05/11- 76 year old male former smoker seen because of obstructive sleep apnea on kind referral by Dr. Modesto Charon from [facility] in [place]. Wife(?) is here. A diagnostic sleep study on 01/31/2008 was done at [facility] because of insomnia with sleep apnea. This study confirmed moderately severe obstructive sleep apnea with an AHI of 22 per hour. A full face mask became uncomfortable, he got tired of it and stopped using CPAP several months to a year ago. Wife now reports loud snoring and again says he stays sleepy during the day although he has difficulty falling asleep. Bedtime between 8 and 9 PM with sleep latency at least to 30 minutes. He feels that he awakens and will lie awake or get up and wander around in the home  half of each night. He finally gets up around 5 AM. When he is asleep he feels that he is sleeping well but this is quite fragmented. He admits drowsiness during the day if he sits quietly. Occasional cough in the morning. Feels smothered lying supine, better on his sides. Treated for hypertension and elevated cholesterol but he denies heart or lung disease. Denies ENT surgery.  He had smoked heavily but quit 20 years ago.  04/23/11-  76 year old male former smoker seen because of obstructive sleep apnea. A male companion is with him. He has not been able to be compliant with CPAP, blaming pains in the back of his neck and spine related to wearing the CPAP. We discussed how it might be constraining motion until his muscles gets stiff. He wore it for one week out of the two-week auto titration trial. He has an older machine that worked well. He says his mask leak so that was unusable. His home care company would not replace it because he old money. He wants to skip the auto titration, go back to his old machine, change equipment supplier and get a new mask. We discussed CPAP, indications and alternatives,  medical concerns, and the documentation of adequate compliance to meet the Medicare rules. He isn't sure of the pressure setting on his old machine, which was one of the reasons we went for auto titration.  06/04/11- 76 year old male former smoker seen because of obstructive sleep apnea. Wife here. He got a replacement CPAP mask but is using his old machine. [facility] changed his pressure to 10. The current mask does not leak. He complains of insomnia with or without CPAP and that seems to be the biggest problem interfering with a sustained use of CPAP now.  07/30/11- 76 year old male former smoker seen because of obstructive sleep apnea. Wife here He try his old CPAP machine again but complains that humidifier gurgles in the flows even when he tries putting it lower than the level of his head so water runs back into the machine. He tried without she notify her but didn't like that either. Wife doesn't think CPAP stopped his snoring. He asks for a new sleep study which is the documentation we really need. He is sometimes restless at night even if he takes a sleeping pill.  09/02/19- 76 year old male former smoker seen because of obstructive sleep apnea.  NPSG 08/22/11- moderate OSA, AHI 26.8/hr with CPAP titration to 9 cwp for AHI 0.5/ hr. Oxygen was added at 2 L/M due to desaturation, and will need follow-up.  11/29/11- 76 year old male former smoker seen because of obstructive sleep apnea.   Pt  hasnt worn mask fo 1 month. pt denies any sob,wheezing, chest congestion He was hospitalized twice for back surgery and I think complication of that surgery. Through that, he dropped off of CPAP. Wife says he still uses it a few hours at a time and it works when he wears it. Control has seemed to good at 9 CWP. Sleep is also disturbed by pain in his knees   Review of Systems-see HPI Constitutional:   No-   weight loss, night sweats, fevers, chills, +fatigue, lassitude HEENT:   No-  headaches, difficulty  swallowing, tooth/dental problems, sore throat,       No-  sneezing, itching, ear ache, nasal congestion, post nasal drip,  CV:  No-   chest pain, orthopnea, PND, swelling in lower extremities, anasarca, dizziness, palpitations Resp: No-   shortness of breath with exertion or at rest.              No-   productive cough,  No non-productive cough,  No-  coughing up of blood.              No-   change in color of mucus.  No- wheezing.   Skin: No-   rash or lesions. GI:  No-   heartburn, indigestion, abdominal pain, nausea, vomiting,  GU: MS:  No-   joint pain or swelling.   Neuro- normal:  Psych:  No- change in mood or affect. No depression or anxiety.  No memory loss.  Objective:   Physical Exam General- Alert, Oriented, Affect-appropriate, Distress- none acute,  Overweight, calm/laconic but seems intelligent, Using a walker. Skin- rash-none, lesions- none, excoriation- none Lymphadenopathy- none Head- atraumatic            Eyes- Gross vision intact, PERRLA, conjunctivae clear secretions, strabismus            Ears- Hearing, canals-normal            Nose- Clear, no-Septal dev, mucus, polyps, erosion, perforation             Throat- Mallampati II-III , mucosa clear , drainage- none, tonsils- atrophic Neck- flexible , trachea midline, no stridor , thyroid nl, carotid no bruit Chest - symmetrical excursion , unlabored           Heart/CV- RRR , no murmur , no gallop  , no rub, nl s1 s2                           - JVD- none , edema- none, stasis changes- none, varices- none           Lung- few crackles, wheeze- none, cough- none , dullness-none, rub- none           Chest wall-  Abd- Br/ Gen/ Rectal- Not done, not indicated Extrem- cyanosis- none, clubbing, none, atrophy- none, strength- nl Neuro- grossly intact to observation

## 2011-11-29 NOTE — Patient Instructions (Addendum)
Order- [facility]  Replacement CPAP mask of choice. He would like to try full face mask       Dx OSA  Our goal is to wear CPAP all night, every night  Script sent for ambien to help sleep at night

## 2011-12-01 ENCOUNTER — Telehealth: Payer: Self-pay | Admitting: *Deleted

## 2011-12-01 NOTE — Telephone Encounter (Signed)
[facility] faxed Revlimid prescription refill request.  Request to MD for review. 

## 2011-12-02 ENCOUNTER — Encounter: Payer: Self-pay | Admitting: *Deleted

## 2011-12-02 ENCOUNTER — Other Ambulatory Visit: Payer: Self-pay | Admitting: *Deleted

## 2011-12-02 MED ORDER — LENALIDOMIDE 10 MG PO CAPS: 10.0000 mg | ORAL_CAPSULE | Freq: Every day | ORAL | Status: DC

## 2011-12-02 NOTE — Progress Notes (Signed)
Confirmation fax from [facility] that referral for Revlimid received and in insurance verification process.

## 2011-12-03 NOTE — Assessment & Plan Note (Signed)
Plan-treat the insomnia component with a trial of Ambien 5 mg at bedtime if needed. Sleep hygiene discussion.

## 2011-12-03 NOTE — Assessment & Plan Note (Signed)
CPAP 9 at good control. He just needs to use it more. We discussed CPAP, merits and ways to manage problems.

## 2011-12-07 ENCOUNTER — Ambulatory Visit (HOSPITAL_BASED_OUTPATIENT_CLINIC_OR_DEPARTMENT_OTHER): Payer: Medicare Other | Admitting: Nurse Practitioner

## 2011-12-07 ENCOUNTER — Telehealth: Payer: Self-pay | Admitting: Oncology

## 2011-12-07 ENCOUNTER — Other Ambulatory Visit (HOSPITAL_BASED_OUTPATIENT_CLINIC_OR_DEPARTMENT_OTHER): Payer: Medicare Other

## 2011-12-07 DIAGNOSIS — Z86718 Personal history of other venous thrombosis and embolism: Secondary | ICD-10-CM

## 2011-12-07 DIAGNOSIS — C9000 Multiple myeloma not having achieved remission: Secondary | ICD-10-CM

## 2011-12-07 DIAGNOSIS — T451X5A Adverse effect of antineoplastic and immunosuppressive drugs, initial encounter: Secondary | ICD-10-CM

## 2011-12-07 DIAGNOSIS — R42 Dizziness and giddiness: Secondary | ICD-10-CM

## 2011-12-07 DIAGNOSIS — I82819 Embolism and thrombosis of superficial veins of unspecified lower extremities: Secondary | ICD-10-CM

## 2011-12-07 LAB — COMPREHENSIVE METABOLIC PANEL
ALT: 30 U/L (ref 0–53)
AST: 21 U/L (ref 0–37)
Alkaline Phosphatase: 66 U/L (ref 39–117)
Creatinine, Ser: 0.83 mg/dL (ref 0.50–1.35)
Sodium: 133 mEq/L — ABNORMAL LOW (ref 135–145)
Total Bilirubin: 0.3 mg/dL (ref 0.3–1.2)
Total Protein: 8.1 g/dL (ref 6.0–8.3)

## 2011-12-07 LAB — CBC WITH DIFFERENTIAL/PLATELET
BASO%: 1.4 % (ref 0.0–2.0)
EOS%: 3.4 % (ref 0.0–7.0)
Eosinophils Absolute: 0.2 10*3/uL (ref 0.0–0.5)
MCH: 28.4 pg (ref 27.2–33.4)
MCHC: 34.3 g/dL (ref 32.0–36.0)
MCV: 82.8 fL (ref 79.3–98.0)
MONO%: 8.3 % (ref 0.0–14.0)
NEUT#: 3.3 10*3/uL (ref 1.5–6.5)
RBC: 3.56 10*6/uL — ABNORMAL LOW (ref 4.20–5.82)
RDW: 17.8 % — ABNORMAL HIGH (ref 11.0–14.6)
nRBC: 0 % (ref 0–0)

## 2011-12-07 NOTE — Telephone Encounter (Signed)
appts made and printed for pt aom °

## 2011-12-07 NOTE — Progress Notes (Signed)
OFFICE PROGRESS NOTE  Interval history:  [name] returns as scheduled. He completed cycle 1 of Revlimid on 12/01/2011. He continues weekly dexamethasone. He denies nausea/vomiting. No mouth sores. No diarrhea.   Objective: Blood pressure 140/74, pulse 93, temperature 98.1 F (36.7 C), temperature source Oral, height 6' (1.829 m), weight 232 lb 11.2 oz (105.552 kg).  Oropharynx is without thrush or ulceration. Lungs are clear. Regular cardiac rhythm. Abdomen is soft and nontender. No organomegaly. Extremities are without edema. Slight decrease in strength at the proximal right leg.  Lab Results: Lab Results  Component Value Date   WBC 6.6 12/07/2011   HGB 10.1* 12/07/2011   HCT 29.5* 12/07/2011   MCV 82.8 12/07/2011   PLT 250 12/07/2011    Chemistry:    Chemistry      Component Value Date/Time   NA 133* 12/07/2011 1342   K 3.6 12/07/2011 1342   CL 101 12/07/2011 1342   CO2 23 12/07/2011 1342   BUN 16 12/07/2011 1342   CREATININE 0.83 12/07/2011 1342      Component Value Date/Time   CALCIUM 8.2* 12/07/2011 1342   ALKPHOS 66 12/07/2011 1342   AST 21 12/07/2011 1342   ALT 30 12/07/2011 1342   BILITOT 0.3 12/07/2011 1342       Studies/Results: Dg Chest 2 View  11/08/2011  *RADIOLOGY REPORT*  Clinical Data: Coagulation disorder, former smoker, history hypertension, hoarseness, recent lumbar surgery  CHEST - 2 VIEW  Comparison: 03/14/2011  Findings: Normal heart size, mediastinal contours, and pulmonary vascularity. Bibasilar atelectasis versus early infiltrate, slightly greater on left. Upper lungs clear. No pleural effusion or pneumothorax. Central peribronchial thickening noted. Bones appear demineralized evidence of prior cervical spine surgery.  IMPRESSION: Bibasilar atelectasis versus early infiltrate, greater on left.  Original Report Authenticated By: Lollie Marrow, M.D.    Medications: I have reviewed the patient's current medications.  Assessment/Plan:  1. Multiple myeloma,  status post treatment with melphalan/prednisone/thalidomide beginning in November 2009. The thalidomide was increased to 200 mg daily beginning 09/11/2008. The serum M spike was slightly improved on 03/18/2009 and slightly increased on 05/09/2009. He has been maintained off of specific therapy for multiple myeloma since September 2010. The serum M spike was increased on 10/20/2011 at 4.6. He began Revlimid on 11/11/2011. He takes weekly dexamethasone. 2. Chronic "dizziness." 3. Chest x-ray 11/01/2008 with a patchy opacity at the right midlung suspicious for pneumonia. He was treated with Avelox. 4. Low back pain with a radicular component. An MRI on 10/07/2008 showed no involvement of the lumbosacral spine with myeloma. 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 5. Bilateral neck pain, status post a CT 05/17/2008 with findings of spinal stenosis and osteoarthritis. 6. 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 7. Red cell microcytosis, likely related to hemoglobin C trait. A ferritin level returned elevated and a stool Hemoccult was negative 01/23/2010. He underwent a colonoscopy in 2009. 8. Elevated beta-2 microglobulin level. 9. History of hypertension. 10. History of a herniated disk at L4-L5 on MRI an 01/11/2002. 11. Hyperlipidemia. 12. Gastroesophageal reflux disease. 13. History of atypical angina. 14. History of rectal bleeding. 15. Superficial venous thrombosis of the greater saphenous vein on a Doppler 09/27/2008. Negative for deep vein thrombosis. Symptoms improved with aspirin. 16. Neck pain with numbness/tingling in the arms, hands, and low back with proximal right leg weakness. An MRI of the cervical spine 05/30/2009 showed multilevel spondylosis  with mild cord edema at C4-C5 and C5-C6. He was noted to have an enhancing lesion of the cord at T3-T4 of unclear etiology. In the  lumbar spine there was no change in the spondylosis at L3-L4 and L4-L5. There was no involvement of the cervical or lumbar spine with myeloma. He is status post C4-C5, C5-C6, and C6-C7 anterior cervical diskectomy with fusion by Dr. Jordan Likes 08/01/2009. 17. History of mild thrombocytopenia. 18. Intermittent indeterminate-age deep vein thrombosis of the left lower extremity on a venous Doppler 08/15/2009. There were also findings consistent with superficial thrombosis involving the right lower extremity 08/15/2009. Coumadin was discontinued in October 2011. 19. Type 2 diabetes. 20. History of hematuria, followed at [facility]. Urinalysis was negative for blood on 07/23/2011. 21. Radicular back pain with MRI of the lumbar spine on 10/19/2011 showing nerve root impingement at L2-L3, L3-L4 and L4-L5 with the most severe at the right L3 nerve roots due to a large disc fragment. Associated right leg weakness. He underwent right L2-3 decompressive laminotomy with right L2 and L3 decompressive foraminotomy; right L2-3 microdiscectomy. 22. Constipation. Bowels moving. He continues a laxative regimen. 23. Hospitalization 11/08/2011 through 11/10/2011 with orthostatic hypotension/dizziness. He improved with intravenous hydration. He had mild hypotension when here on 11/18/2011. We recommended discontinuing Norvasc and Proscar. 24. Hypercalcemia status post pamidronate 11/03/2011. Calcium 7.4 on 11/10/2011.  Disposition-[name] appears stable. He will begin cycle #2 Revlimid on 12/09/2011. He will continue weekly dexamethasone. He will return for a followup visit on 12/31/2011. We will repeat the SPEP on 12/31/2011. He will contact the office prior to his next visit with any problems.  Plan reviewed with Dr. Truett Perna.  Lonna Cobb ANP/GNP-BC

## 2011-12-08 NOTE — Telephone Encounter (Signed)
[facility] faxed confirmation of revlimid prescription.  Shipped Revlimid on 12-07-11 with next business day delivery.

## 2011-12-20 ENCOUNTER — Encounter: Payer: Self-pay | Admitting: Internal Medicine

## 2011-12-30 NOTE — Progress Notes (Signed)
Quick Note:  Pt aware of compliance and will call as needed. ______

## 2011-12-31 ENCOUNTER — Other Ambulatory Visit: Payer: Self-pay | Admitting: *Deleted

## 2011-12-31 ENCOUNTER — Telehealth: Payer: Self-pay | Admitting: Oncology

## 2011-12-31 ENCOUNTER — Ambulatory Visit (HOSPITAL_BASED_OUTPATIENT_CLINIC_OR_DEPARTMENT_OTHER): Payer: Medicare Other | Admitting: Oncology

## 2011-12-31 ENCOUNTER — Other Ambulatory Visit (HOSPITAL_BASED_OUTPATIENT_CLINIC_OR_DEPARTMENT_OTHER): Payer: Medicare Other | Admitting: Lab

## 2011-12-31 VITALS — BP 158/77 | HR 74 | Temp 97.2°F | Ht 72.0 in | Wt 232.7 lb

## 2011-12-31 DIAGNOSIS — C9000 Multiple myeloma not having achieved remission: Secondary | ICD-10-CM

## 2011-12-31 DIAGNOSIS — T451X5A Adverse effect of antineoplastic and immunosuppressive drugs, initial encounter: Secondary | ICD-10-CM

## 2011-12-31 DIAGNOSIS — N289 Disorder of kidney and ureter, unspecified: Secondary | ICD-10-CM

## 2011-12-31 DIAGNOSIS — C888 Other malignant immunoproliferative diseases: Secondary | ICD-10-CM

## 2011-12-31 DIAGNOSIS — D638 Anemia in other chronic diseases classified elsewhere: Secondary | ICD-10-CM

## 2011-12-31 LAB — CBC WITH DIFFERENTIAL/PLATELET
BASO%: 1 % (ref 0.0–2.0)
HCT: 31.8 % — ABNORMAL LOW (ref 38.4–49.9)
LYMPH%: 31.1 % (ref 14.0–49.0)
MCHC: 34.3 g/dL (ref 32.0–36.0)
MCV: 84.4 fL (ref 79.3–98.0)
MONO%: 13.6 % (ref 0.0–14.0)
NEUT%: 48 % (ref 39.0–75.0)
Platelets: 221 10*3/uL (ref 140–400)
RBC: 3.77 10*6/uL — ABNORMAL LOW (ref 4.20–5.82)

## 2011-12-31 MED ORDER — LENALIDOMIDE 10 MG PO CAPS: 10.0000 mg | ORAL_CAPSULE | Freq: Every day | ORAL | Status: DC

## 2011-12-31 MED ORDER — DEXAMETHASONE 4 MG PO TABS: 40.0000 mg | ORAL_TABLET | ORAL | Status: DC

## 2011-12-31 NOTE — Telephone Encounter (Signed)
appts made and printed for pt  °

## 2011-12-31 NOTE — Addendum Note (Signed)
Addended by: Caren Griffins on: 12/31/2011 03:21 PM   Modules accepted: Orders

## 2011-12-31 NOTE — Telephone Encounter (Signed)
Rx refill from [facility] for revlimid given to Dr Kalman Drape RN for review.  SLJ

## 2011-12-31 NOTE — Progress Notes (Signed)
[facility]    OFFICE PROGRESS NOTE   INTERVAL HISTORY:   He returns as scheduled. He completed another cycle of Revlimid on 12/29/2011. He continues weekly Decadron. He reports tolerating the Revlimid well. No nausea or neuropathy symptoms. The back pain has improved following the recent surgery. He continues to have "dizziness ". He has a constant discomfort in the subxiphoid region. No associated symptoms.  Objective:  Vital signs in last 24 hours:  Blood pressure 158/77, pulse 74, temperature 97.2 F (36.2 C), temperature source Oral, height 6' (1.829 m), weight 232 lb 11.2 oz (105.552 kg).    HEENT: No thrush Lymphatics: Prominent bilateral axillary fat pads,? Lipoma in the right axilla Resp: Lungs clear bilaterally Cardio: Regular rate and rhythm GI: Nontender, no hepatosplenomegaly Vascular: No leg edema    Lab Results:  Lab Results  Component Value Date   WBC 5.3 12/31/2011   HGB 10.9* 12/31/2011   HCT 31.8* 12/31/2011   MCV 84.4 12/31/2011   PLT 221 12/31/2011   ANC 2.5, serum protein electrophoresis pending    Medications: I have reviewed the patient's current medications.  Assessment/Plan: 1. Multiple myeloma, status post treatment with melphalan/prednisone/thalidomide beginning in November 2009. The thalidomide was increased to 200 mg daily beginning 09/11/2008. The serum M spike was slightly improved on 03/18/2009 and slightly increased on 05/09/2009. He has been maintained off of specific therapy for multiple myeloma since September 2010. The serum M spike was increased on 10/20/2011 at 4.6. He began Revlimid on 11/11/2011. He takes weekly dexamethasone. He began cycle 2 of Revlimid on may 30th 2013. 2. Chronic "dizziness." 3. Chest x-ray 11/01/2008 with a patchy opacity at the right midlung suspicious for pneumonia. He was treated with Avelox. 4. Low back pain with a radicular component. An MRI on 10/07/2008 showed no involvement of the  lumbosacral spine with myeloma. 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 5. Bilateral neck pain, status post a CT 05/17/2008 with findings of spinal stenosis and osteoarthritis. 6. 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 7. Red cell microcytosis, likely related to hemoglobin C trait. A ferritin level returned elevated and a stool Hemoccult was negative 01/23/2010. He underwent a colonoscopy in 2009. 8. Elevated beta-2 microglobulin level. 9. History of hypertension. 10. History of a herniated disk at L4-L5 on MRI an 01/11/2002. 11. Hyperlipidemia. 12. Gastroesophageal reflux disease. 13. History of atypical angina. 14. History of rectal bleeding. 15. Superficial venous thrombosis of the greater saphenous vein on a Doppler 09/27/2008. Negative for deep vein thrombosis. Symptoms improved with aspirin. 16. Neck pain with numbness/tingling in the arms, hands, and low back with proximal right leg weakness. An MRI of the cervical spine 05/30/2009 showed multilevel spondylosis with mild cord edema at C4-C5 and C5-C6. He was noted to have an enhancing lesion of the cord at T3-T4 of unclear etiology. In the lumbar spine there was no change in the spondylosis at L3-L4 and L4-L5. There was no involvement of the cervical or lumbar spine with myeloma. He is status post C4-C5, C5-C6, and C6-C7 anterior cervical diskectomy with fusion by Dr. Jordan Likes 08/01/2009. 17. History of mild thrombocytopenia. 18. Intermittent indeterminate-age deep vein thrombosis of the left lower extremity on a venous Doppler 08/15/2009. There were also findings consistent with superficial thrombosis involving the right lower extremity 08/15/2009. Coumadin was discontinued in October 2011. 19. Type 2 diabetes. 20. History of hematuria, followed at  [facility]. Urinalysis was negative for blood on 07/23/2011. 21. Radicular  back pain with MRI of the lumbar spine on 10/19/2011 showing nerve root impingement at L2-L3, L3-L4 and L4-L5 with the most severe at the right L3 nerve roots due to a large disc fragment. Associated right leg weakness. He underwent right L2-3 decompressive laminotomy with right L2 and L3 decompressive foraminotomy; right L2-3 microdiscectomy on 11/01/2011.. 22. Constipation. Bowels moving. He continues a laxative regimen. 23. Hospitalization 11/08/2011 through 11/10/2011 with orthostatic hypotension/dizziness. He improved with intravenous hydration. He had mild hypotension when here on 11/18/2011. We recommended discontinuing Norvasc and Proscar. 24. Hypercalcemia status post pamidronate 11/03/2011. Calcium 7.4 on 11/10/2011.  Disposition:  Mr. Tabet appears stable. He is completed 2 cycles of salvage therapy with Revlimid/Decadron. He appears to be tolerating the treatment well. We will followup on the chemistry panel and serum protein electrophoresis from today. He will begin a third cycle of Revlimid/Decadron on 01/06/2012. Mr. Gatlin will return for an office visit on 01/28/2012.   Thornton Papas, MD  12/31/2011  3:28 PM

## 2011-12-31 NOTE — Telephone Encounter (Signed)
Auth # [id number], signed rx faxed to [facility].  SLJ

## 2012-01-04 LAB — PROTEIN ELECTROPHORESIS, SERUM
Albumin ELP: 38.1 % — ABNORMAL LOW (ref 55.8–66.1)
Alpha-2-Globulin: 9.1 % (ref 7.1–11.8)
Beta 2: 3.1 % — ABNORMAL LOW (ref 3.2–6.5)
Beta Globulin: 4.8 % (ref 4.7–7.2)
Total Protein, Serum Electrophoresis: 7.8 g/dL (ref 6.0–8.3)

## 2012-01-04 LAB — COMPREHENSIVE METABOLIC PANEL
ALT: 18 U/L (ref 0–53)
Alkaline Phosphatase: 58 U/L (ref 39–117)
CO2: 26 mEq/L (ref 19–32)
Creatinine, Ser: 0.83 mg/dL (ref 0.50–1.35)
Glucose, Bld: 108 mg/dL — ABNORMAL HIGH (ref 70–99)
Sodium: 133 mEq/L — ABNORMAL LOW (ref 135–145)
Total Bilirubin: 0.5 mg/dL (ref 0.3–1.2)
Total Protein: 7.8 g/dL (ref 6.0–8.3)

## 2012-01-04 NOTE — Telephone Encounter (Signed)
[facility] faxed confirmation of prescription shipment.  Revlimid was shipped 01-03-2012 with next Business day delivery.

## 2012-01-06 ENCOUNTER — Telehealth: Payer: Self-pay | Admitting: *Deleted

## 2012-01-06 DIAGNOSIS — C9000 Multiple myeloma not having achieved remission: Secondary | ICD-10-CM

## 2012-01-06 MED ORDER — POTASSIUM CHLORIDE CRYS ER 20 MEQ PO TBCR: 20.0000 meq | EXTENDED_RELEASE_TABLET | Freq: Every day | ORAL | Status: DC

## 2012-01-06 NOTE — Telephone Encounter (Signed)
Message copied by Wandalee Ferdinand on Thu Jan 06, 2012 12:41 PM ------      Message from: Thornton Papas B      Created: Wed Jan 05, 2012 11:14 PM       Please call patient, m-spike is better,cont.rev/dex, K+ is low, start kcl daily, cmet next lab

## 2012-01-06 NOTE — Telephone Encounter (Signed)
Notified wife of improvement in SPEP. Confirmed he started his next Revlimid cycle today and weekly Decadron. Explained reason he needs to begin K+ tablet daily. She understands and agrees. Will pick up script today.

## 2012-01-24 ENCOUNTER — Encounter: Payer: Self-pay | Admitting: Internal Medicine

## 2012-01-28 ENCOUNTER — Ambulatory Visit (HOSPITAL_BASED_OUTPATIENT_CLINIC_OR_DEPARTMENT_OTHER): Payer: Medicare Other | Admitting: Nurse Practitioner

## 2012-01-28 ENCOUNTER — Other Ambulatory Visit: Payer: Self-pay | Admitting: *Deleted

## 2012-01-28 ENCOUNTER — Other Ambulatory Visit (HOSPITAL_BASED_OUTPATIENT_CLINIC_OR_DEPARTMENT_OTHER): Payer: Medicare Other | Admitting: Lab

## 2012-01-28 VITALS — BP 117/77 | HR 74 | Temp 97.0°F | Ht 72.0 in | Wt 231.3 lb

## 2012-01-28 DIAGNOSIS — C9000 Multiple myeloma not having achieved remission: Secondary | ICD-10-CM

## 2012-01-28 DIAGNOSIS — N289 Disorder of kidney and ureter, unspecified: Secondary | ICD-10-CM

## 2012-01-28 LAB — CBC WITH DIFFERENTIAL/PLATELET
BASO%: 0.7 % (ref 0.0–2.0)
EOS%: 4.6 % (ref 0.0–7.0)
MCHC: 34.2 g/dL (ref 32.0–36.0)
MONO#: 0.8 10*3/uL (ref 0.1–0.9)
RBC: 4.01 10*6/uL — ABNORMAL LOW (ref 4.20–5.82)
RDW: 19.5 % — ABNORMAL HIGH (ref 11.0–14.6)
WBC: 5.8 10*3/uL (ref 4.0–10.3)
lymph#: 2.1 10*3/uL (ref 0.9–3.3)

## 2012-01-28 MED ORDER — LENALIDOMIDE 10 MG PO CAPS: 10.0000 mg | ORAL_CAPSULE | Freq: Every day | ORAL | Status: DC

## 2012-01-28 NOTE — Telephone Encounter (Signed)
appt made and printed for pt,pt seeing leb pulm on 8/20 and due to drive appt was made same day   aom

## 2012-01-28 NOTE — Progress Notes (Signed)
OFFICE PROGRESS NOTE  Interval history:  [name] returns as scheduled. He completed cycle 3 Revlimid beginning 01/06/2012. He continues weekly Decadron. He denies nausea. No numbness or tingling in his hands or feet. He denies pain. Bowels moving regularly with the aid of a laxative. He continues to have dizziness.   Objective: Blood pressure 117/77, pulse 74, temperature 97 F (36.1 C), temperature source Oral, height 6' (1.829 m), weight 231 lb 4.8 oz (104.917 kg).  Oropharynx is without thrush or ulceration. Lungs are clear. Regular cardiac rhythm. Occasional premature beat. Abdomen is soft and nontender. No organomegaly. Extremities are without edema.  Lab Results: Lab Results  Component Value Date   WBC 5.8 01/28/2012   HGB 11.5* 01/28/2012   HCT 33.7* 01/28/2012   MCV 84.0 01/28/2012   PLT 202 01/28/2012    Chemistry:    Chemistry      Component Value Date/Time   NA 133* 12/31/2011 1108   K 3.2* 12/31/2011 1108   CL 101 12/31/2011 1108   CO2 26 12/31/2011 1108   BUN 9 12/31/2011 1108   CREATININE 0.83 12/31/2011 1108      Component Value Date/Time   CALCIUM 9.0 12/31/2011 1108   ALKPHOS 58 12/31/2011 1108   AST 18 12/31/2011 1108   ALT 18 12/31/2011 1108   BILITOT 0.5 12/31/2011 1108       Studies/Results: No results found.  Medications: I have reviewed the patient's current medications.  Assessment/Plan:  1. Multiple myeloma, status post treatment with melphalan/prednisone/thalidomide beginning in November 2009. The thalidomide was increased to 200 mg daily beginning 09/11/2008. The serum M spike was slightly improved on 03/18/2009 and slightly increased on 05/09/2009. He has been maintained off of specific therapy for multiple myeloma since September 2010. The serum M spike was increased on 10/20/2011 at 4.6. He began Revlimid on 11/11/2011. He takes weekly dexamethasone. He began cycle 2 of Revlimid on 12/09/2011. The serum M spike was improved on 12/31/2011. He began  cycle 3 on 01/06/2012. 2. Chronic "dizziness." 3. Chest x-ray 11/01/2008 with a patchy opacity at the right midlung suspicious for pneumonia. He was treated with Avelox. 4. Low back pain with a radicular component. An MRI on 10/07/2008 showed no involvement of the lumbosacral spine with myeloma. 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 5. Bilateral neck pain, status post a CT 05/17/2008 with findings of spinal stenosis and osteoarthritis. 6. 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 7. Red cell microcytosis, likely related to hemoglobin C trait. A ferritin level returned elevated and a stool Hemoccult was negative 01/23/2010. He underwent a colonoscopy in 2009. 8. Elevated beta-2 microglobulin level. 9. History of hypertension. 10. History of a herniated disk at L4-L5 on MRI an 01/11/2002. 11. Hyperlipidemia. 12. Gastroesophageal reflux disease. 13. History of atypical angina. 14. History of rectal bleeding. 15. Superficial venous thrombosis of the greater saphenous vein on a Doppler 09/27/2008. Negative for deep vein thrombosis. Symptoms improved with aspirin. 16. Neck pain with numbness/tingling in the arms, hands, and low back with proximal right leg weakness. An MRI of the cervical spine 05/30/2009 showed multilevel spondylosis with mild cord edema at C4-C5 and C5-C6. He was noted to have an enhancing lesion of the cord at T3-T4 of unclear etiology. In the lumbar spine there was no change in the spondylosis at L3-L4 and L4-L5. There was no involvement of the cervical or lumbar spine with myeloma. He  is status post C4-C5, C5-C6, and C6-C7 anterior cervical diskectomy with fusion by Dr. Jordan Likes 08/01/2009. 17. History of mild thrombocytopenia. 18. Intermittent indeterminate-age deep vein thrombosis of the left lower extremity on a venous Doppler 08/15/2009. There were also findings  consistent with superficial thrombosis involving the right lower extremity 08/15/2009. Coumadin was discontinued in October 2011. 19. Type 2 diabetes. 20. History of hematuria, followed at [facility]. Urinalysis was negative for blood on 07/23/2011. 21. Radicular back pain with MRI of the lumbar spine on 10/19/2011 showing nerve root impingement at L2-L3, L3-L4 and L4-L5 with the most severe at the right L3 nerve roots due to a large disc fragment. Associated right leg weakness. He underwent right L2-3 decompressive laminotomy with right L2 and L3 decompressive foraminotomy; right L2-3 microdiscectomy on 11/01/2011.. 22. Constipation. Bowels moving. He continues a laxative regimen. 23. Hospitalization 11/08/2011 through 11/10/2011 with orthostatic hypotension/dizziness. He improved with intravenous hydration. He had mild hypotension when here on 11/18/2011. We recommended discontinuing Norvasc and Proscar. 24. Hypercalcemia status post pamidronate 11/03/2011. Calcium 7.4 on 11/10/2011; 9.0 on 12/31/2011.  Disposition-[name] appears stable. He has now completed 3 cycles of Revlimid/dexamethasone. The serum M spike was improved following 2 cycles. He will begin cycle 4 Revlimid 02/03/2012 with continuation of weekly Decadron. He will return for a followup visit in 4 weeks. He will contact the office in the interim with any problems.  Plan reviewed with Dr. Truett Perna.  Lonna Cobb ANP/GNP-BC

## 2012-01-28 NOTE — Telephone Encounter (Signed)
Faxed request from [facility] for Revlimid given to Kem Boroughs RN.  Pt. Is seeing Lonna Cobb NP today.

## 2012-02-01 LAB — COMPREHENSIVE METABOLIC PANEL
ALT: 15 U/L (ref 0–53)
Albumin: 3 g/dL — ABNORMAL LOW (ref 3.5–5.2)
Alkaline Phosphatase: 58 U/L (ref 39–117)
CO2: 23 mEq/L (ref 19–32)
Glucose, Bld: 88 mg/dL (ref 70–99)
Potassium: 4.5 mEq/L (ref 3.5–5.3)
Sodium: 129 mEq/L — ABNORMAL LOW (ref 135–145)
Total Protein: 8.4 g/dL — ABNORMAL HIGH (ref 6.0–8.3)

## 2012-02-01 LAB — PROTEIN ELECTROPHORESIS, SERUM
Alpha-1-Globulin: 3.7 % (ref 2.9–4.9)
Alpha-2-Globulin: 9 % (ref 7.1–11.8)
Total Protein, Serum Electrophoresis: 8.4 g/dL — ABNORMAL HIGH (ref 6.0–8.3)

## 2012-02-01 NOTE — Telephone Encounter (Signed)
[facility] faxed confirmation of prescription shipment.  Revlimid was shipped 01-31-2012 with next business day delivery.

## 2012-02-02 ENCOUNTER — Telehealth: Payer: Self-pay | Admitting: Oncology

## 2012-02-02 ENCOUNTER — Telehealth: Payer: Self-pay | Admitting: *Deleted

## 2012-02-02 DIAGNOSIS — C9000 Multiple myeloma not having achieved remission: Secondary | ICD-10-CM

## 2012-02-02 NOTE — Telephone Encounter (Signed)
Called pt, MSpike results given. He understands to expect call from schedulers for lab appt.

## 2012-02-02 NOTE — Telephone Encounter (Signed)
Message copied by Caleb Popp on Wed Feb 02, 2012 10:46 AM ------      Message from: Ladene Artist      Created: Tue Feb 01, 2012  9:56 PM       Please call patient, m-spike is stable, check cmet and ionized calcium next 1-2 weeks to f/u on mildly elevated Ca++ level

## 2012-02-02 NOTE — Telephone Encounter (Signed)
S/w the pt's dtr and they are aware of the aug lab appt

## 2012-02-16 ENCOUNTER — Ambulatory Visit (HOSPITAL_BASED_OUTPATIENT_CLINIC_OR_DEPARTMENT_OTHER): Payer: Medicare Other

## 2012-02-16 ENCOUNTER — Other Ambulatory Visit: Payer: Self-pay | Admitting: Nurse Practitioner

## 2012-02-16 ENCOUNTER — Other Ambulatory Visit (HOSPITAL_BASED_OUTPATIENT_CLINIC_OR_DEPARTMENT_OTHER): Payer: Medicare Other | Admitting: Lab

## 2012-02-16 ENCOUNTER — Other Ambulatory Visit: Payer: Self-pay | Admitting: *Deleted

## 2012-02-16 DIAGNOSIS — C9000 Multiple myeloma not having achieved remission: Secondary | ICD-10-CM

## 2012-02-16 LAB — COMPREHENSIVE METABOLIC PANEL
AST: 16 U/L (ref 0–37)
Albumin: 2.6 g/dL — ABNORMAL LOW (ref 3.5–5.2)
Alkaline Phosphatase: 64 U/L (ref 39–117)
BUN: 28 mg/dL — ABNORMAL HIGH (ref 6–23)
Glucose, Bld: 109 mg/dL — ABNORMAL HIGH (ref 70–99)
Potassium: 4.2 mEq/L (ref 3.5–5.3)
Sodium: 128 mEq/L — ABNORMAL LOW (ref 135–145)
Total Bilirubin: 0.4 mg/dL (ref 0.3–1.2)

## 2012-02-16 MED ORDER — ZOLEDRONIC ACID 4 MG/5ML IV CONC: 4.0000 mg | Freq: Once | INTRAVENOUS | Status: DC

## 2012-02-16 MED ORDER — DEXAMETHASONE 4 MG PO TABS: 20.0000 mg | ORAL_TABLET | Freq: Two times a day (BID) | ORAL | Status: DC

## 2012-02-16 MED ORDER — ZOLEDRONIC ACID 4 MG/100ML IV SOLN
4.0000 mg | Freq: Once | INTRAVENOUS | Status: AC
  Administered 2012-02-16: 4 mg via INTRAVENOUS
  Filled 2012-02-16: qty 100

## 2012-02-16 NOTE — Telephone Encounter (Signed)
Stat CMET reviewed by Misty Stanley, NP and Dr. Cyndie Chime. Order received for Zometa ASAP and Decadron 20 mg BID for 4 days. Called pt and wife, they will return to office today for Zometa infusion.

## 2012-02-16 NOTE — Patient Instructions (Signed)
Zoledronic Acid injection (Hypercalcemia, Oncology) What is this medicine? ZOLEDRONIC ACID (ZOE le dron ik AS id) lowers the amount of calcium loss from bone. It is used to treat too much calcium in your blood from cancer. It is also used to prevent complications of cancer that has spread to the bone. This medicine may be used for other purposes; ask your health care provider or pharmacist if you have questions. What should I tell my health care provider before I take this medicine? They need to know if you have any of these conditions: -aspirin-sensitive asthma -dental disease -kidney disease -an unusual or allergic reaction to zoledronic acid, other medicines, foods, dyes, or preservatives -pregnant or trying to get pregnant -breast-feeding How should I use this medicine? This medicine is for infusion into a vein. It is given by a health care professional in a hospital or clinic setting. Talk to your pediatrician regarding the use of this medicine in children. Special care may be needed. Overdosage: If you think you have taken too much of this medicine contact a poison control center or emergency room at once. NOTE: This medicine is only for you. Do not share this medicine with others. What if I miss a dose? It is important not to miss your dose. Call your doctor or health care professional if you are unable to keep an appointment. What may interact with this medicine? -certain antibiotics given by injection -NSAIDs, medicines for pain and inflammation, like ibuprofen or naproxen -some diuretics like bumetanide, furosemide -teriparatide -thalidomide This list may not describe all possible interactions. Give your health care provider a list of all the medicines, herbs, non-prescription drugs, or dietary supplements you use. Also tell them if you smoke, drink alcohol, or use illegal drugs. Some items may interact with your medicine. What should I watch for while using this medicine? Visit  your doctor or health care professional for regular checkups. It may be some time before you see the benefit from this medicine. Do not stop taking your medicine unless your doctor tells you to. Your doctor may order blood tests or other tests to see how you are doing. Women should inform their doctor if they wish to become pregnant or think they might be pregnant. There is a potential for serious side effects to an unborn child. Talk to your health care professional or pharmacist for more information. You should make sure that you get enough calcium and vitamin D while you are taking this medicine. Discuss the foods you eat and the vitamins you take with your health care professional. Some people who take this medicine have severe bone, joint, and/or muscle pain. This medicine may also increase your risk for a broken thigh bone. Tell your doctor right away if you have pain in your upper leg or groin. Tell your doctor if you have any pain that does not go away or that gets worse. What side effects may I notice from receiving this medicine? Side effects that you should report to your doctor or health care professional as soon as possible: -allergic reactions like skin rash, itching or hives, swelling of the face, lips, or tongue -anxiety, confusion, or depression -breathing problems -changes in vision -feeling faint or lightheaded, falls -jaw burning, cramping, pain -muscle cramps, stiffness, or weakness -trouble passing urine or change in the amount of urine Side effects that usually do not require medical attention (report to your doctor or health care professional if they continue or are bothersome): -bone, joint, or muscle pain -  fever -hair loss -irritation at site where injected -loss of appetite -nausea, vomiting -stomach upset -tired This list may not describe all possible side effects. Call your doctor for medical advice about side effects. You may report side effects to FDA at  1-800-FDA-1088. Where should I keep my medicine? This drug is given in a hospital or clinic and will not be stored at home. NOTE: This sheet is a summary. It may not cover all possible information. If you have questions about this medicine, talk to your doctor, pharmacist, or health care provider.  2012, [employer] Standard. (12/25/2010 9:06:58 AM) 

## 2012-02-17 ENCOUNTER — Telehealth: Payer: Self-pay | Admitting: Oncology

## 2012-02-17 ENCOUNTER — Other Ambulatory Visit: Payer: Self-pay | Admitting: *Deleted

## 2012-02-17 NOTE — Telephone Encounter (Signed)
Moved 8/20 appt to 8/16 per 8/8 pof. S/w wife re change and new d/t.

## 2012-02-19 ENCOUNTER — Other Ambulatory Visit: Payer: Self-pay | Admitting: Nurse Practitioner

## 2012-02-19 DIAGNOSIS — C9000 Multiple myeloma not having achieved remission: Secondary | ICD-10-CM

## 2012-02-25 ENCOUNTER — Telehealth: Payer: Self-pay | Admitting: Oncology

## 2012-02-25 ENCOUNTER — Other Ambulatory Visit: Payer: Self-pay | Admitting: *Deleted

## 2012-02-25 ENCOUNTER — Ambulatory Visit (HOSPITAL_BASED_OUTPATIENT_CLINIC_OR_DEPARTMENT_OTHER): Payer: Medicare Other | Admitting: Oncology

## 2012-02-25 ENCOUNTER — Other Ambulatory Visit (HOSPITAL_BASED_OUTPATIENT_CLINIC_OR_DEPARTMENT_OTHER): Payer: Medicare Other | Admitting: Lab

## 2012-02-25 VITALS — BP 132/70 | HR 77 | Temp 96.9°F | Resp 20 | Ht 72.0 in | Wt 226.9 lb

## 2012-02-25 DIAGNOSIS — D649 Anemia, unspecified: Secondary | ICD-10-CM

## 2012-02-25 DIAGNOSIS — C9000 Multiple myeloma not having achieved remission: Secondary | ICD-10-CM

## 2012-02-25 DIAGNOSIS — K625 Hemorrhage of anus and rectum: Secondary | ICD-10-CM

## 2012-02-25 DIAGNOSIS — R42 Dizziness and giddiness: Secondary | ICD-10-CM

## 2012-02-25 LAB — COMPREHENSIVE METABOLIC PANEL
AST: 24 U/L (ref 0–37)
Alkaline Phosphatase: 79 U/L (ref 39–117)
BUN: 17 mg/dL (ref 6–23)
Calcium: 7.5 mg/dL — ABNORMAL LOW (ref 8.4–10.5)
Chloride: 100 mEq/L (ref 96–112)
Creatinine, Ser: 0.98 mg/dL (ref 0.50–1.35)
Total Bilirubin: 0.2 mg/dL — ABNORMAL LOW (ref 0.3–1.2)

## 2012-02-25 LAB — CBC WITH DIFFERENTIAL/PLATELET
BASO%: 0.3 % (ref 0.0–2.0)
Basophils Absolute: 0 10*3/uL (ref 0.0–0.1)
EOS%: 4.6 % (ref 0.0–7.0)
HCT: 34.6 % — ABNORMAL LOW (ref 38.4–49.9)
HGB: 11.9 g/dL — ABNORMAL LOW (ref 13.0–17.1)
LYMPH%: 27.3 % (ref 14.0–49.0)
MCH: 28.7 pg (ref 27.2–33.4)
MCHC: 34.5 g/dL (ref 32.0–36.0)
MCV: 83 fL (ref 79.3–98.0)
MONO%: 11.7 % (ref 0.0–14.0)
NEUT%: 56.1 % (ref 39.0–75.0)
Platelets: 206 10*3/uL (ref 140–400)

## 2012-02-25 MED ORDER — CYCLOPHOSPHAMIDE 50 MG PO TABS: 600.0000 mg | ORAL_TABLET | ORAL | Status: DC

## 2012-02-25 MED ORDER — ACYCLOVIR 400 MG PO TABS: 400.0000 mg | ORAL_TABLET | Freq: Two times a day (BID) | ORAL | Status: DC

## 2012-02-25 NOTE — Progress Notes (Signed)
[facility]    OFFICE PROGRESS NOTE   INTERVAL HISTORY:   [name] returns as scheduled. He received Zometa on 02/16/2012 after the calcium returned elevated at 11.5. He complains of chronic "dizziness "and constipation. He has a burning discomfort in the abdomen. He reports 2 episodes of bright red blood per rectum this week.  Objective:  Vital signs in last 24 hours:  Blood pressure 132/70, pulse 77, temperature 96.9 F (36.1 C), temperature source Oral, resp. rate 20, height 6' (1.829 m), weight 226 lb 14.4 oz (102.921 kg).    HEENT: Neck without mass Resp: Lungs clear bilaterally Cardio: Regular rate and rhythm GI: The abdomen is nontender, no hepatomegaly, no mass Vascular: No leg edema  Rectal: Large external hemorrhoids, the prostate is symmetrically enlarged, no rectal mass, the stool is light brown and Hemoccult positive     Lab Results:  Lab Results  Component Value Date   WBC 5.5 02/25/2012   HGB 11.9* 02/25/2012   HCT 34.6* 02/25/2012   MCV 83.0 02/25/2012   PLT 206 02/25/2012   ANC 3.1 Potassium 3.2, BUN 17, creatinine 0.98, calcium 7.5    Medications: I have reviewed the patient's current medications.  Assessment/Plan: 1. Multiple myeloma, status post treatment with melphalan/prednisone/thalidomide beginning in November 2009. The thalidomide was increased to 200 mg daily beginning 09/11/2008. The serum M spike was slightly improved on 03/18/2009 and slightly increased on 05/09/2009. He has been maintained off of specific therapy for multiple myeloma since September 2010. The serum M spike was increased on 10/20/2011 at 4.6. He began Revlimid on 11/11/2011. He takes weekly dexamethasone. He began cycle 2 of Revlimid on 12/09/2011. The serum M spike was improved on 12/31/2011. The serum M spike was slightly higher on 01/28/2012 .He began cycle 4 on 02/03/2012. 2. Chronic "dizziness." 3. Chest x-ray 11/01/2008 with a patchy opacity at the right  midlung suspicious for pneumonia. He was treated with Avelox. 4. Low back pain with a radicular component. An MRI on 10/07/2008 showed no involvement of the lumbosacral spine with myeloma. 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 5. Bilateral neck pain, status post a CT 05/17/2008 with findings of spinal stenosis and osteoarthritis. 6. 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 7. Red cell microcytosis, likely related to hemoglobin C trait. A ferritin level returned elevated and a stool Hemoccult was negative 01/23/2010. He underwent a colonoscopy in 2009. 8. Elevated beta-2 microglobulin level. 9. History of hypertension. 10. History of a herniated disk at L4-L5 on MRI an 01/11/2002. 11. Hyperlipidemia. 12. Gastroesophageal reflux disease. 13. History of atypical angina. 14. History of rectal bleeding-large external hemorrhoids noted on exam today. The stool was Hemoccult positive. He has a history of colon polyps. He will be referred to Dr. Leone Payor to consider a repeat colonoscopy. 15. Superficial venous thrombosis of the greater saphenous vein on a Doppler 09/27/2008. Negative for deep vein thrombosis. Symptoms improved with aspirin. 16. Neck pain with numbness/tingling in the arms, hands, and low back with proximal right leg weakness. An MRI of the cervical spine 05/30/2009 showed multilevel spondylosis with mild cord edema at C4-C5 and C5-C6. He was noted to have an enhancing lesion of the cord at T3-T4 of unclear etiology. In the lumbar spine there was no change in the spondylosis at L3-L4 and L4-L5. There was no involvement of the cervical or lumbar spine with myeloma. He is status  post C4-C5, C5-C6, and C6-C7 anterior cervical diskectomy with fusion by Dr. Jordan Likes 08/01/2009. 17. History of mild thrombocytopenia. 18. Intermittent indeterminate-age deep vein thrombosis of the left  lower extremity on a venous Doppler 08/15/2009. There were also findings consistent with superficial thrombosis involving the right lower extremity 08/15/2009. Coumadin was discontinued in October 2011. 19. Type 2 diabetes. 20. History of hematuria, followed at [facility]. Urinalysis was negative for blood on 07/23/2011. 21. Radicular back pain with MRI of the lumbar spine on 10/19/2011 showing nerve root impingement at L2-L3, L3-L4 and L4-L5 with the most severe at the right L3 nerve roots due to a large disc fragment. Associated right leg weakness. He underwent right L2-3 decompressive laminotomy with right L2 and L3 decompressive foraminotomy; right L2-3 microdiscectomy on 11/01/2011.. 22. Constipation. Bowels moving. He continues a laxative regimen. 23. Hospitalization 11/08/2011 through 11/10/2011 with orthostatic hypotension/dizziness. He improved with intravenous hydration. He had mild hypotension when here on 11/18/2011. We recommended discontinuing Norvasc and Proscar. 24. Hypercalcemia status post pamidronate 11/03/2011. Recurrent hypercalcemia 02/16/2012, status post Zometa. The calcium is normal today.   Disposition:  [name] appears stable. He will be referred to Dr. Leone Payor to consider colonoscopy to evaluate the rectal bleeding and Hemoccult positive stool.  He had recurrent hypercalcemia last week. This responded to Zometa. The serum M spike was slightly higher on 01/28/2012. I suspect the myeloma is progressing while on Revlimid/Decadron. We will followup on the IgG level and serum M spike from today.  The plan is to make a change to Cytoxan/Velcade/Decadron. I reviewed the potential toxicities associated with this regimen including the chance for mouth sores, nausea, hematologic toxicity, diarrhea, and neuropathy. He will be placed on prophylactic acyclovir. The plan is to begin weekly treatment on the above regimen 03/02/2012. [name] will return for an office visit on  03/16/2012.   Thornton Papas, MD  02/25/2012  4:50 PM

## 2012-02-25 NOTE — Telephone Encounter (Signed)
gv wife appt schedule for August/September. Due to after hrs pt aware he will be contacted w/GI appt. I will be out next wk and Rose will f/u on GI referral.

## 2012-02-25 NOTE — Telephone Encounter (Signed)
Faxed request for Revlimid received from [facility].  Given to Kem Boroughs RN.  Pt. Sees Dr. Truett Perna today

## 2012-02-25 NOTE — Progress Notes (Signed)
Gave patient and wife printed information and verbal information on Cytoxan. Script to managed care.

## 2012-02-28 ENCOUNTER — Other Ambulatory Visit: Payer: Medicare Other | Admitting: Lab

## 2012-02-28 ENCOUNTER — Ambulatory Visit: Payer: Medicare Other | Admitting: Oncology

## 2012-02-29 ENCOUNTER — Encounter: Payer: Self-pay | Admitting: Oncology

## 2012-02-29 ENCOUNTER — Other Ambulatory Visit: Payer: Medicare Other | Admitting: Lab

## 2012-02-29 ENCOUNTER — Telehealth: Payer: Self-pay | Admitting: Oncology

## 2012-02-29 ENCOUNTER — Ambulatory Visit (INDEPENDENT_AMBULATORY_CARE_PROVIDER_SITE_OTHER): Payer: Medicare Other | Admitting: Internal Medicine

## 2012-02-29 ENCOUNTER — Ambulatory Visit: Payer: Medicare Other | Admitting: Oncology

## 2012-02-29 ENCOUNTER — Encounter: Payer: Self-pay | Admitting: Internal Medicine

## 2012-02-29 ENCOUNTER — Telehealth: Payer: Self-pay | Admitting: Internal Medicine

## 2012-02-29 VITALS — BP 160/78 | HR 83 | Ht 72.0 in | Wt 233.6 lb

## 2012-02-29 DIAGNOSIS — G4733 Obstructive sleep apnea (adult) (pediatric): Secondary | ICD-10-CM

## 2012-02-29 LAB — PROTEIN ELECTROPHORESIS, SERUM
Alpha-1-Globulin: 4.5 % (ref 2.9–4.9)
Alpha-2-Globulin: 11.5 % (ref 7.1–11.8)
Beta Globulin: 4.4 % — ABNORMAL LOW (ref 4.7–7.2)
Gamma Globulin: 39.6 % — ABNORMAL HIGH (ref 11.1–18.8)

## 2012-02-29 NOTE — Progress Notes (Signed)
Patient ID: [name], male    DOB: 1936/02/16, 76 y.o.   MRN: [id number]  HPI 03/05/11- 76 year old male former smoker seen because of obstructive sleep apnea on kind referral by Dr. Jacelyn Grip from [facility] in [place]. Wife(?) is here. A diagnostic sleep study on 01/31/2008 was done at [facility] because of insomnia with sleep apnea. This study confirmed moderately severe obstructive sleep apnea with an AHI of 22 per hour. A full face mask became uncomfortable, he got tired of it and stopped using CPAP several months to a year ago. Wife now reports loud snoring and again says he stays sleepy during the day although he has difficulty falling asleep. Bedtime between 8 and 9 PM with sleep latency at least to 30 minutes. He feels that he awakens and will lie awake or get up and wander around in the home  half of each night. He finally gets up around 5 AM. When he is asleep he feels that he is sleeping well but this is quite fragmented. He admits drowsiness during the day if he sits quietly. Occasional cough in the morning. Feels smothered lying supine, better on his sides. Treated for hypertension and elevated cholesterol but he denies heart or lung disease. Denies ENT surgery.  He had smoked heavily but quit 20 years ago.  04/23/11-  76 year old male former smoker seen because of obstructive sleep apnea. A male companion is with him. He has not been able to be compliant with CPAP, blaming pains in the back of his neck and spine related to wearing the CPAP. We discussed how it might be constraining motion until his muscles gets stiff. He wore it for one week out of the two-week auto titration trial. He has an older machine that worked well. He says his mask leak so that was unusable. His home care company would not replace it because he old money. He wants to skip the auto titration, go back to his old machine, change equipment supplier and get a new mask. We discussed CPAP, indications and alternatives,  medical concerns, and the documentation of adequate compliance to meet the Medicare rules. He isn't sure of the pressure setting on his old machine, which was one of the reasons we went for auto titration.  06/04/11- 76 year old male former smoker seen because of obstructive sleep apnea. Wife here. He got a replacement CPAP mask but is using his old machine. [facility] changed his pressure to 10. The current mask does not leak. He complains of insomnia with or without CPAP and that seems to be the biggest problem interfering with a sustained use of CPAP now.  07/30/11- 76 year old male former smoker seen because of obstructive sleep apnea. Wife here He try his old CPAP machine again but complains that humidifier gurgles in the flows even when he tries putting it lower than the level of his head so water runs back into the machine. He tried without she notify her but didn't like that either. Wife doesn't think CPAP stopped his snoring. He asks for a new sleep study which is the documentation we really need. He is sometimes restless at night even if he takes a sleeping pill.  09/02/19- 76 year old male former smoker seen because of obstructive sleep apnea.     NPSG 08/22/11- moderate OSA, AHI 26.8/hr with CPAP titration to 9 cwp for AHI 0.5/ hr. Oxygen was added at 2 L/M due to desaturation, and will need follow-up.  11/29/11- 76 year old male former smoker seen because of obstructive sleep apnea.  Pt hasnt worn mask fo 1 month. pt denies any sob,wheezing, chest congestion He was hospitalized twice for back surgery and I think complication of that surgery. Through that, he dropped off of CPAP. Wife says he still uses it a few hours at a time and it works when he wears it. Control has seemed  good at 9 CWP/ [facility]. Sleep is also disturbed by pain in his knees  02/29/12- 76 year old male former smoker seen because of obstructive sleep apnea.        Wife here  CPAP mask is not working well-unable to get one  until September due to insurance; Trying to wear CPAP every night  for 8-9 hours, but current mask hurts his face.  Review of Systems-see HPI Constitutional:   No-   weight loss, night sweats, fevers, chills, +fatigue, lassitude HEENT:   No-  headaches, difficulty swallowing, tooth/dental problems, sore throat,       No-  sneezing, itching, ear ache, nasal congestion, post nasal drip,  CV:  No-   chest pain, orthopnea, PND, swelling in lower extremities, anasarca, dizziness, palpitations Resp: No-   shortness of breath with exertion or at rest.              No-   productive cough,  No non-productive cough,  No-  coughing up of blood.              No-   change in color of mucus.  No- wheezing.   Skin: No-   rash or lesions. GI:  No-   heartburn, indigestion, abdominal pain, nausea, vomiting,  GU: MS:  No-   joint pain or swelling.   Neuro- normal:  Psych:  No- change in mood or affect. No depression or anxiety.  No memory loss.  Objective:   Physical Exam General- Alert, Oriented, Affect-appropriate, Distress- none acute,  Overweight, calm/laconic but seems intelligent, Using a walker. Skin- rash-none, lesions- none, excoriation- none Lymphadenopathy- none Head- atraumatic            Eyes- Gross vision intact, PERRLA, conjunctivae clear secretions, strabismus            Ears- Hearing, canals-normal            Nose- Clear, no-Septal dev, mucus, polyps, erosion, perforation             Throat- Mallampati II-III , mucosa clear , drainage- none, tonsils- atrophic Neck- flexible , trachea midline, no stridor , thyroid nl, carotid no bruit Chest - symmetrical excursion , unlabored           Heart/CV- RRR , no murmur , no gallop  , no rub, nl s1 s2                           - JVD- none , edema- none, stasis changes- none, varices- none           Lung- clear, wheeze- none, cough- none , dullness-none, rub- none           Chest wall-  Abd- Br/ Gen/ Rectal- Not done, not indicated Extrem-  cyanosis- none, clubbing, none, atrophy- none, strength- nl Neuro- grossly intact to observation

## 2012-02-29 NOTE — Telephone Encounter (Signed)
Patient is scheduled for 03/06/12 2:00 with Willette Cluster RNP.  Rose at the [facility] is advised that if the patient is symptomatic he will need ER eval.

## 2012-02-29 NOTE — Telephone Encounter (Signed)
Called pt numemrous times today, tried all the numbers, no vm, pt has appt with GI @ [facility] , Dr. Leone Payor

## 2012-02-29 NOTE — Progress Notes (Signed)
Faxed cytoxan prescription to [facility]. °

## 2012-02-29 NOTE — Telephone Encounter (Signed)
Talked to pt and gave him appt for 03/01/12 and on 03/06/12 for GI doctor @ [facility]

## 2012-02-29 NOTE — Telephone Encounter (Signed)
Called Dr. Leone Payor GI, @ [facility], first opening in 3rd week of September 2013, waiting for a call from Triage nurse.

## 2012-02-29 NOTE — Patient Instructions (Addendum)
Try to adjust your current mask, perhaps cushioning with cotton balls, so you can use it until [facility] can work with you to get a new mask in September  We will leave your CPAP pressure at 9 for now.

## 2012-02-29 NOTE — Progress Notes (Signed)
[employer] approved cytoxan 50mg  under Medicare Part B from 02-29-12-12-31.13.

## 2012-03-01 ENCOUNTER — Telehealth: Payer: Self-pay | Admitting: *Deleted

## 2012-03-01 NOTE — Telephone Encounter (Signed)
[facility] faxed confirmation of prescription shipment.  Cyclophosphamide was shipped 02-29-2012 with next business day delivery.

## 2012-03-01 NOTE — Telephone Encounter (Signed)
[facility] faxed information about Revlimid and Cyclophosphamide services patient is receiving.

## 2012-03-02 ENCOUNTER — Other Ambulatory Visit (HOSPITAL_BASED_OUTPATIENT_CLINIC_OR_DEPARTMENT_OTHER): Payer: Medicare Other | Admitting: Lab

## 2012-03-02 ENCOUNTER — Other Ambulatory Visit: Payer: Self-pay | Admitting: Oncology

## 2012-03-02 ENCOUNTER — Ambulatory Visit (HOSPITAL_BASED_OUTPATIENT_CLINIC_OR_DEPARTMENT_OTHER): Payer: Medicare Other

## 2012-03-02 VITALS — BP 131/79 | HR 68 | Temp 98.1°F | Resp 20

## 2012-03-02 DIAGNOSIS — C9000 Multiple myeloma not having achieved remission: Secondary | ICD-10-CM

## 2012-03-02 DIAGNOSIS — Z5112 Encounter for antineoplastic immunotherapy: Secondary | ICD-10-CM

## 2012-03-02 LAB — BASIC METABOLIC PANEL
Chloride: 105 mEq/L (ref 96–112)
Glucose, Bld: 91 mg/dL (ref 70–99)
Potassium: 3.9 mEq/L (ref 3.5–5.3)
Sodium: 133 mEq/L — ABNORMAL LOW (ref 135–145)

## 2012-03-02 LAB — CBC WITH DIFFERENTIAL/PLATELET
Basophils Absolute: 0.1 10*3/uL (ref 0.0–0.1)
EOS%: 1.9 % (ref 0.0–7.0)
Eosinophils Absolute: 0.1 10*3/uL (ref 0.0–0.5)
HGB: 10.9 g/dL — ABNORMAL LOW (ref 13.0–17.1)
MCH: 28 pg (ref 27.2–33.4)
MONO#: 0.5 10*3/uL (ref 0.1–0.9)
NEUT#: 2.5 10*3/uL (ref 1.5–6.5)
RDW: 18.4 % — ABNORMAL HIGH (ref 11.0–14.6)
WBC: 5.2 10*3/uL (ref 4.0–10.3)
lymph#: 2 10*3/uL (ref 0.9–3.3)

## 2012-03-02 MED ORDER — BORTEZOMIB CHEMO SQ INJECTION 3.5 MG (2.5MG/ML)
1.3000 mg/m2 | Freq: Once | INTRAMUSCULAR | Status: AC
  Administered 2012-03-02: 3 mg via SUBCUTANEOUS
  Filled 2012-03-02: qty 3

## 2012-03-02 MED ORDER — ONDANSETRON HCL 8 MG PO TABS
8.0000 mg | ORAL_TABLET | Freq: Once | ORAL | Status: AC
  Administered 2012-03-02: 8 mg via ORAL

## 2012-03-02 MED ORDER — DEXAMETHASONE 4 MG PO TABS
40.0000 mg | ORAL_TABLET | Freq: Once | ORAL | Status: AC
  Administered 2012-03-02: 40 mg via ORAL

## 2012-03-02 NOTE — Patient Instructions (Signed)
[facility] Discharge Instructions for Patients Receiving Chemotherapy  Today you received the following chemotherapy agents velcade  To help prevent nausea and vomiting after your treatment, we encourage you to take your nausea medication as needed Begin taking it at 10pm and take it as often as prescribed for the next 48 hours as needed .   If you develop nausea and vomiting that is not controlled by your nausea medication, call the clinic. If it is after clinic hours your family physician or the after hours number for the clinic or go to the Emergency Department.   BELOW ARE SYMPTOMS THAT SHOULD BE REPORTED IMMEDIATELY:  *FEVER GREATER THAN 100.5 F  *CHILLS WITH OR WITHOUT FEVER  NAUSEA AND VOMITING THAT IS NOT CONTROLLED WITH YOUR NAUSEA MEDICATION  *UNUSUAL SHORTNESS OF BREATH  *UNUSUAL BRUISING OR BLEEDING  TENDERNESS IN MOUTH AND THROAT WITH OR WITHOUT PRESENCE OF ULCERS  *URINARY PROBLEMS  *BOWEL PROBLEMS  UNUSUAL RASH Items with * indicate a potential emergency and should be followed up as soon as possible.  One of the nurses will contact you 24 hours after your treatment. Please let the nurse know about any problems that you may have experienced. Feel free to call the clinic you have any questions or concerns. The clinic phone number is [phone].   I have been informed and understand all the instructions given to me. I know to contact the clinic, my physician, or go to the Emergency Department if any problems should occur. I do not have any questions at this time, but understand that I may call the clinic during office hours   should I have any questions or need assistance in obtaining follow up care.    __________________________________________  _____________  __________ Signature of Patient or Authorized Representative            Date                   Time    __________________________________________ Nurse's Signature

## 2012-03-03 ENCOUNTER — Telehealth: Payer: Self-pay | Admitting: *Deleted

## 2012-03-03 NOTE — Telephone Encounter (Signed)
Message copied by Augusto Garbe on Fri Mar 03, 2012  1:04 PM ------      Message from: Gaylord Shih      Created: Thu Mar 02, 2012  5:26 PM      Regarding: chemo follow up       Started po cytoxan 8/22, received sq velcade and po decadron 40mg  8/22. Pt had a question about taking aspirin with cytoxan from the package insert. I was unable to ascertain answer from Dr Truett Perna or pharmacy d/t time. Pt will be at his cell phone #.

## 2012-03-03 NOTE — Telephone Encounter (Signed)
Spoke with patient and his wife [name].  He is doing well and doesn't feel any differently.  Is drinking water and eating well.  Asked about taking the aspirin 325 mg dose.  Checked with pharmacist who did not find any interaction with aspirin and cytoxan.  Patient says his doctor told him to take this to prevent heart attacks.  No further questions.

## 2012-03-05 NOTE — Assessment & Plan Note (Signed)
CPAP compliance and control are pretty good, given discomfort he is having with broken mask. We discussed how to cushion it with a cotton ball or similar padding until his insurance can replace it

## 2012-03-06 ENCOUNTER — Ambulatory Visit (INDEPENDENT_AMBULATORY_CARE_PROVIDER_SITE_OTHER): Payer: Medicare Other | Admitting: Nurse Practitioner

## 2012-03-06 ENCOUNTER — Encounter: Payer: Self-pay | Admitting: Nurse Practitioner

## 2012-03-06 VITALS — BP 132/70 | HR 80 | Ht 72.0 in | Wt 234.0 lb

## 2012-03-06 DIAGNOSIS — Z86010 Personal history of colonic polyps: Secondary | ICD-10-CM

## 2012-03-06 DIAGNOSIS — K625 Hemorrhage of anus and rectum: Secondary | ICD-10-CM

## 2012-03-06 DIAGNOSIS — K649 Unspecified hemorrhoids: Secondary | ICD-10-CM

## 2012-03-06 DIAGNOSIS — K5900 Constipation, unspecified: Secondary | ICD-10-CM | POA: Insufficient documentation

## 2012-03-06 MED ORDER — HYDROCORTISONE ACETATE 25 MG RE SUPP: 25.0000 mg | Freq: Two times a day (BID) | RECTAL | Status: DC

## 2012-03-06 NOTE — Patient Instructions (Addendum)
We sent a prescription to [facility] for Anusol HC Suppositories. Stop the Sorbitol.  Take Miralax 17 grams , once daily. You can increase to twice daily for constipation if you need to. Make a follow up appointment to see Dr. Leone Payor.

## 2012-03-06 NOTE — Progress Notes (Signed)
03/06/2012 [name] [id number] 1935-11-27   HISTORY OF PRESENT ILLNESS: Patient is a 76 year old male with a history of multiple myeloma for which he is currently undergoing treatment under the direction of Dr. Myrle Sheng. Patient is known to Dr. Leone Payor for a history of colon polyps. His last surveillance colonoscopy was August 2009. Findings included diverticulosis, internal hemorrhoids and 2 small adenomatous polyps.  Patient comes in today for evaluation of rectal bleeding. He passed some red blood with a bowel movement several days ago, no bleeding since. Bleeding was painless. Hgb on 02/25/12 was 11.9. Patient does complain of constipation, he has been on sorbitol for several months but it causes gassiness and gives inconsistent results. He has a history of hemorrhoids and uses Preparation H from time to time.   Past Medical History  Diagnosis Date  . GERD (gastroesophageal reflux disease)   . Hypertension   . BPH (benign prostatic hyperplasia)   . Lumbar herniated disc L4-5  . Hyperlipidemia   . Atypical angina     june 2004  . Sleep apnea   . Multiple myeloma 02/19/2008  . Arthritis    Past Surgical History  Procedure Date  . Neck surgery   . Lumbar laminectomy/decompression microdiscectomy 11/01/2011    Procedure: LUMBAR LAMINECTOMY/DECOMPRESSION MICRODISCECTOMY 2 LEVELS;  Surgeon: Temple Pacini, MD;  Location: [facility];  Service: Neurosurgery;  Laterality: Right;  Lumbar Laminectomy/Microdiscectomy Decompression Lumbar Three-Four, Lumbar Four-Five     reports that he has quit smoking. His smoking use included Cigarettes. He has a 40 pack-year smoking history. He has never used smokeless tobacco. He reports that he does not drink alcohol or use illicit drugs. family history is not on file.  He is adopted. Allergies  Allergen Reactions  . Penicillins Rash      Outpatient Encounter Prescriptions as of 03/06/2012  Medication Sig Dispense Refill  . acyclovir (ZOVIRAX) 400 MG  tablet Take 1 tablet (400 mg total) by mouth 2 (two) times daily.  60 tablet  3  . aspirin 325 MG EC tablet Take 325 mg by mouth 2 (two) times daily.       Marland Kitchen atorvastatin (LIPITOR) 10 MG tablet Take 1 tablet by mouth Daily.      . cyclophosphamide (CYTOXAN) 50 MG tablet Take 12 tablets (600 mg total) by mouth once a week. Give on an empty stomach 1 hour before or 2 hours after meals.  48 tablet  0  . dexamethasone (DECADRON) 4 MG tablet Take 20 mg by mouth as directed. Take 20 mg by mouth on Thursday not getting dose at [facility]      . diclofenac (VOLTAREN) 75 MG EC tablet Take 75 mg by mouth 2 (two) times daily.       Marland Kitchen omeprazole (PRILOSEC) 20 MG capsule Take 1 tablet by mouth daily.      . Potassium Chloride (K-TABS PO) Take 20 mEq by mouth daily.      . sorbitol 70 % solution Take 30 mLs by mouth 2 (two) times daily. Decrease to daily when bowels become regular      . Tamsulosin HCl (FLOMAX) 0.4 MG CAPS Take 0.4 mg by mouth 2 (two) times daily.       Marland Kitchen zolpidem (AMBIEN) 10 MG tablet Take 5 mg by mouth at bedtime as needed.         REVIEW OF SYSTEMS  : All other systems reviewed and negative except where noted in the History of Present Illness.   PHYSICAL EXAM:  BP 132/70  Pulse 80  Ht 6' (1.829 m)  Wt 234 lb (106.142 kg)  BMI 31.74 kg/m2 General: Well developed black male in no acute distress Head: Normocephalic and atraumatic Eyes:  sclerae anicteric,conjunctive pink. Ears: Normal auditory acuity Neck: Supple, soft tumor over left clavicle (feels like lipoma) Lungs: Clear throughout to auscultation Heart: Regular rate and rhythm Abdomen: Soft, non distended, nontender. No masses or hepatomegaly noted. Normal Bowel sounds Rectal: Large external hemorrhoids, on anoscopy there were large and inflamed internal hemorrhoids. Minimal brown stool in vault, Hemoccult negative Musculoskeletal: Symmetrical with no gross deformities  Skin: No lesions on visible extremities Extremities:  No edema or deformities noted Neurological: Alert oriented x 4, grossly nonfocal Cervical Nodes:  No significant cervical adenopathy Psychological:  Alert and cooperative. Normal mood and affect  ASSESSMENT AND PLAN:  1. painless rectal bleeding, one episode with bowel movement last week. He has large internal and external hemorrhoids, suspect this is source of bleeding. Hemorrhoids aggravated by constipation.   2. Hemorrhoids.  Will treat constipation as well as try Anusol suppositories for hemorrhoids. Patient to return to clinic in a few weeks for reassessment of hemorrhoids. If he continues to have bleeding then hemorrhoids may need banding or patient sent for surgical evaluation. If bleeding recurs despite successful treatment of constipation and hemorrhoids then we may need to consider repeating colonoscopy at earlier time.    3. Constipation, stop Sorbitol for now as it is causing gas. Trial of Miralax  4. history of adenomatous colon polyps, up-to-date on surveillance colonoscopy with next one being due August 2014..  5. Multiple myeloma, currently undergoing treatment with Dr. Truett Perna.

## 2012-03-07 ENCOUNTER — Other Ambulatory Visit: Payer: Self-pay | Admitting: Oncology

## 2012-03-07 NOTE — Progress Notes (Signed)
Reviewed and agree with management plan. Malcolm T. Stark MD FACG 

## 2012-03-09 ENCOUNTER — Ambulatory Visit (HOSPITAL_BASED_OUTPATIENT_CLINIC_OR_DEPARTMENT_OTHER): Payer: Medicare Other

## 2012-03-09 ENCOUNTER — Other Ambulatory Visit (HOSPITAL_BASED_OUTPATIENT_CLINIC_OR_DEPARTMENT_OTHER): Payer: Medicare Other | Admitting: Lab

## 2012-03-09 VITALS — BP 117/72 | HR 79 | Temp 97.6°F | Resp 17

## 2012-03-09 DIAGNOSIS — C9000 Multiple myeloma not having achieved remission: Secondary | ICD-10-CM

## 2012-03-09 DIAGNOSIS — Z5112 Encounter for antineoplastic immunotherapy: Secondary | ICD-10-CM

## 2012-03-09 LAB — CBC WITH DIFFERENTIAL/PLATELET
BASO%: 1.5 % (ref 0.0–2.0)
Basophils Absolute: 0.1 10*3/uL (ref 0.0–0.1)
EOS%: 3.1 % (ref 0.0–7.0)
HCT: 31 % — ABNORMAL LOW (ref 38.4–49.9)
HGB: 11.4 g/dL — ABNORMAL LOW (ref 13.0–17.1)
LYMPH%: 36.6 % (ref 14.0–49.0)
MCH: 28.1 pg (ref 27.2–33.4)
MCHC: 36.8 g/dL — ABNORMAL HIGH (ref 32.0–36.0)
MCV: 76.4 fL — ABNORMAL LOW (ref 79.3–98.0)
MONO%: 11.7 % (ref 0.0–14.0)
NEUT%: 47.1 % (ref 39.0–75.0)
Platelets: 200 10*3/uL (ref 140–400)
lymph#: 1.8 10*3/uL (ref 0.9–3.3)

## 2012-03-09 MED ORDER — ONDANSETRON HCL 8 MG PO TABS
8.0000 mg | ORAL_TABLET | Freq: Once | ORAL | Status: AC
  Administered 2012-03-09: 8 mg via ORAL

## 2012-03-09 MED ORDER — BORTEZOMIB CHEMO SQ INJECTION 3.5 MG (2.5MG/ML)
1.3000 mg/m2 | Freq: Once | INTRAMUSCULAR | Status: AC
  Administered 2012-03-09: 3 mg via SUBCUTANEOUS
  Filled 2012-03-09: qty 3

## 2012-03-09 MED ORDER — DEXAMETHASONE 4 MG PO TABS
40.0000 mg | ORAL_TABLET | Freq: Once | ORAL | Status: AC
  Administered 2012-03-09: 40 mg via ORAL

## 2012-03-09 NOTE — Patient Instructions (Signed)
[facility] Discharge Instructions for Patients Receiving Chemotherapy  Today you received the following chemotherapy agent Velcade.  To help prevent nausea and vomiting after your treatment, we encourage you to take your nausea medication. Begin taking your nausea medication as prescribed for by Dr. Truett Perna.    If you develop nausea and vomiting that is not controlled by your nausea medication, call the clinic. If it is after clinic hours your family physician or the after hours number for the clinic or go to the Emergency Department.   BELOW ARE SYMPTOMS THAT SHOULD BE REPORTED IMMEDIATELY:  *FEVER GREATER THAN 100.5 F  *CHILLS WITH OR WITHOUT FEVER  NAUSEA AND VOMITING THAT IS NOT CONTROLLED WITH YOUR NAUSEA MEDICATION  *UNUSUAL SHORTNESS OF BREATH  *UNUSUAL BRUISING OR BLEEDING  TENDERNESS IN MOUTH AND THROAT WITH OR WITHOUT PRESENCE OF ULCERS  *URINARY PROBLEMS  *BOWEL PROBLEMS  UNUSUAL RASH Items with * indicate a potential emergency and should be followed up as soon as possible.  One of the nurses will contact you 24 hours after your treatment. Please let the nurse know about any problems that you may have experienced. Feel free to call the clinic you have any questions or concerns. The clinic phone number is [phone].   I have been informed and understand all the instructions given to me. I know to contact the clinic, my physician, or go to the Emergency Department if any problems should occur. I do not have any questions at this time, but understand that I may call the clinic during office hours   should I have any questions or need assistance in obtaining follow up care.    __________________________________________  _____________  __________ Signature of Patient or Authorized Representative            Date                   Time    __________________________________________ Nurse's Signature      BORTEZOMIB (bor TEZ oh mib) is a  chemotherapy drug. It slows the growth of cancer cells. This medicine is used to treat multiple myeloma, lymphoma, and other cancers. This medicine may be used for other purposes; ask your health care provider or pharmacist if you have questions. What should I tell my health care provider before I take this medicine? They need to know if you have any of these conditions: -heart disease -irregular heartbeat -liver disease -low blood counts, like low white blood cells, platelets, or hemoglobin -peripheral neuropathy -taking medicine for blood pressure -an unusual or allergic reaction to bortezomib, mannitol, boron, other medicines, foods, dyes, or preservatives -pregnant or trying to get pregnant -breast-feeding How should I use this medicine? This medicine is for injection into a vein or for injection under the skin. It is given by a health care professional in a hospital or clinic setting. Talk to your pediatrician regarding the use of this medicine in children. Special care may be needed. Overdosage: If you think you have taken too much of this medicine contact a poison control center or emergency room at once. NOTE: This medicine is only for you. Do not share this medicine with others. What if I miss a dose? It is important not to miss your dose. Call your doctor or health care professional if you are unable to keep an appointment. What may interact with this medicine? -medicines for diabetes -medicines to increase blood counts like filgrastim, pegfilgrastim, sargramostim -zalcitabine Talk to your doctor or health  care professional before taking any of these medicines: -acetaminophen -aspirin -ibuprofen -ketoprofen -naproxen This list may not describe all possible interactions. Give your health care provider a list of all the medicines, herbs, non-prescription drugs, or dietary supplements you use. Also tell them if you smoke, drink alcohol, or use illegal drugs. Some items may  interact with your medicine. What should I watch for while using this medicine? Visit your doctor for checks on your progress. This drug may make you feel generally unwell. This is not uncommon, as chemotherapy can affect healthy cells as well as cancer cells. Report any side effects. Continue your course of treatment even though you feel ill unless your doctor tells you to stop. You may get drowsy or dizzy. Do not drive, use machinery, or do anything that needs mental alertness until you know how this medicine affects you. Do not stand or sit up quickly, especially if you are an older patient. This reduces the risk of dizzy or fainting spells. In some cases, you may be given additional medicines to help with side effects. Follow all directions for their use. Call your doctor or health care professional for advice if you get a fever, chills or sore throat, or other symptoms of a cold or flu. Do not treat yourself. This drug decreases your body's ability to fight infections. Try to avoid being around people who are sick. This medicine may increase your risk to bruise or bleed. Call your doctor or health care professional if you notice any unusual bleeding. Be careful brushing and flossing your teeth or using a toothpick because you may get an infection or bleed more easily. If you have any dental work done, tell your dentist you are receiving this medicine. Avoid taking products that contain aspirin, acetaminophen, ibuprofen, naproxen, or ketoprofen unless instructed by your doctor. These medicines may hide a fever. Do not become pregnant while taking this medicine. Women should inform their doctor if they wish to become pregnant or think they might be pregnant. There is a potential for serious side effects to an unborn child. Talk to your health care professional or pharmacist for more information. Do not breast-feed an infant while taking this medicine. You may have vomiting or diarrhea while taking this  medicine. Drink water or other fluids as directed. What side effects may I notice from receiving this medicine? Side effects that you should report to your doctor or health care professional as soon as possible: -allergic reactions like skin rash, itching or hives, swelling of the face, lips, or tongue -breathing problems -changes in hearing -changes in vision -fast, irregular heartbeat -feeling faint or lightheaded, falls -pain, tingling, numbness in the hands or feet -seizures -swelling of the ankles, feet, hands -unusual bleeding or bruising -unusually weak or tired -vomiting Side effects that usually do not require medical attention (report to your doctor or health care professional if they continue or are bothersome): -changes in emotions or moods -constipation -diarrhea -loss of appetite -headache -irritation at site where injected -nausea This list may not describe all possible side effects. Call your doctor for medical advice about side effects. You may report side effects to FDA at 1-800-FDA-1088. Where should I keep my medicine? This drug is given in a hospital or clinic and will not be stored at home. NOTE: This sheet is a summary. It may not cover all possible information. If you have questions about this medicine, talk to your doctor, pharmacist, or health care provider.  2012, [employer] Standard. (08/05/2010 11:42:36  AM)

## 2012-03-15 ENCOUNTER — Other Ambulatory Visit: Payer: Self-pay | Admitting: Oncology

## 2012-03-16 ENCOUNTER — Other Ambulatory Visit (HOSPITAL_BASED_OUTPATIENT_CLINIC_OR_DEPARTMENT_OTHER): Payer: Medicare Other | Admitting: Lab

## 2012-03-16 ENCOUNTER — Telehealth: Payer: Self-pay | Admitting: Oncology

## 2012-03-16 ENCOUNTER — Ambulatory Visit (HOSPITAL_BASED_OUTPATIENT_CLINIC_OR_DEPARTMENT_OTHER): Payer: Medicare Other

## 2012-03-16 ENCOUNTER — Ambulatory Visit (HOSPITAL_BASED_OUTPATIENT_CLINIC_OR_DEPARTMENT_OTHER): Payer: Medicare Other | Admitting: Nurse Practitioner

## 2012-03-16 ENCOUNTER — Other Ambulatory Visit: Payer: Self-pay | Admitting: *Deleted

## 2012-03-16 ENCOUNTER — Telehealth: Payer: Self-pay | Admitting: *Deleted

## 2012-03-16 VITALS — BP 121/76 | HR 74 | Temp 98.0°F | Resp 18 | Ht 72.0 in | Wt 234.5 lb

## 2012-03-16 VITALS — BP 147/87 | HR 71 | Temp 97.9°F

## 2012-03-16 DIAGNOSIS — C9000 Multiple myeloma not having achieved remission: Secondary | ICD-10-CM

## 2012-03-16 DIAGNOSIS — Z5112 Encounter for antineoplastic immunotherapy: Secondary | ICD-10-CM

## 2012-03-16 DIAGNOSIS — T451X5A Adverse effect of antineoplastic and immunosuppressive drugs, initial encounter: Secondary | ICD-10-CM

## 2012-03-16 DIAGNOSIS — E119 Type 2 diabetes mellitus without complications: Secondary | ICD-10-CM

## 2012-03-16 LAB — BASIC METABOLIC PANEL (CC13)
BUN: 10 mg/dL (ref 7.0–26.0)
Calcium: 8.2 mg/dL — ABNORMAL LOW (ref 8.4–10.4)
Creatinine: 0.9 mg/dL (ref 0.7–1.3)
Glucose: 113 mg/dl — ABNORMAL HIGH (ref 70–99)

## 2012-03-16 LAB — CBC WITH DIFFERENTIAL/PLATELET
Basophils Absolute: 0.1 10*3/uL (ref 0.0–0.1)
Eosinophils Absolute: 0.2 10*3/uL (ref 0.0–0.5)
LYMPH%: 24 % (ref 14.0–49.0)
MCV: 84.2 fL (ref 79.3–98.0)
MONO%: 12.8 % (ref 0.0–14.0)
NEUT#: 3.2 10*3/uL (ref 1.5–6.5)
Platelets: 196 10*3/uL (ref 140–400)
RBC: 3.78 10*6/uL — ABNORMAL LOW (ref 4.20–5.82)

## 2012-03-16 MED ORDER — DEXAMETHASONE 4 MG PO TABS: 20.0000 mg | ORAL_TABLET | ORAL | Status: DC

## 2012-03-16 MED ORDER — ONDANSETRON HCL 8 MG PO TABS
8.0000 mg | ORAL_TABLET | Freq: Once | ORAL | Status: AC
  Administered 2012-03-16: 8 mg via ORAL

## 2012-03-16 MED ORDER — BORTEZOMIB CHEMO SQ INJECTION 3.5 MG (2.5MG/ML)
1.3000 mg/m2 | Freq: Once | INTRAMUSCULAR | Status: AC
  Administered 2012-03-16: 3 mg via SUBCUTANEOUS
  Filled 2012-03-16: qty 3

## 2012-03-16 MED ORDER — CYCLOPHOSPHAMIDE 50 MG PO TABS: 600.0000 mg | ORAL_TABLET | ORAL | Status: DC

## 2012-03-16 MED ORDER — DEXAMETHASONE 4 MG PO TABS
40.0000 mg | ORAL_TABLET | Freq: Once | ORAL | Status: AC
Start: 2012-03-16 — End: 2012-03-16
  Administered 2012-03-16: 40 mg via ORAL

## 2012-03-16 NOTE — Progress Notes (Signed)
OFFICE PROGRESS NOTE  Interval history:  [name] returns as scheduled. He began subcutaneous Velcade/Cytoxan/dexamethasone 03/02/2012. He denies nausea/vomiting. No mouth sores. No diarrhea. No numbness or tingling in his hands or feet.  He reports an episode of low back pain approximately 1 month ago after mowing his lawn with a riding mower. He denies pain at present. He has noted weakness of the right leg. He is able to ambulate but notes that he tires easily. No bowel or bladder dysfunction. He reports a seeing Dr. Jordan Likes yesterday. He has been referred for an MRI.   Objective: Blood pressure 121/76, pulse 74, temperature 98 F (36.7 C), temperature source Oral, resp. rate 18, height 6' (1.829 m), weight 234 lb 8 oz (106.369 kg).  Oropharynx is without thrush or ulceration. Lungs are clear. Regular cardiac rhythm. Abdomen is soft and nontender. No organomegaly. Extremities are without edema. Right proximal leg with slight decrease in strength. Motor strength is otherwise intact. He was able to maneuver onto the exam table without difficulty. He ambulated in the room.  Lab Results: Lab Results  Component Value Date   WBC 5.4 03/16/2012   HGB 11.0* 03/16/2012   HCT 31.9* 03/16/2012   MCV 84.2 03/16/2012   PLT 196 03/16/2012    Chemistry:    Chemistry      Component Value Date/Time   NA 134* 03/16/2012 1301   NA 133* 03/02/2012 1504   K 3.6 03/16/2012 1301   K 3.9 03/02/2012 1504   CL 108* 03/16/2012 1301   CL 105 03/02/2012 1504   CO2 20* 03/16/2012 1301   CO2 24 03/02/2012 1504   BUN 10.0 03/16/2012 1301   BUN 9 03/02/2012 1504   CREATININE 0.9 03/16/2012 1301   CREATININE 0.84 03/02/2012 1504      Component Value Date/Time   CALCIUM 8.2* 03/16/2012 1301   CALCIUM 8.3* 03/02/2012 1504   ALKPHOS 79 02/25/2012 1501   AST 24 02/25/2012 1501   ALT 39 02/25/2012 1501   BILITOT 0.2* 02/25/2012 1501       Studies/Results: No results found.  Medications: I have reviewed the patient's current  medications.  Assessment/Plan:  1. Multiple myeloma, status post treatment with melphalan/prednisone/thalidomide beginning in November 2009. The thalidomide was increased to 200 mg daily beginning 09/11/2008. The serum M spike was slightly improved on 03/18/2009 and slightly increased on 05/09/2009. He has been maintained off of specific therapy for multiple myeloma since September 2010. The serum M spike was increased on 10/20/2011 at 4.6. He began Revlimid on 11/11/2011. He takes weekly dexamethasone. He began cycle 2 of Revlimid on 12/09/2011. The serum M spike was improved on 12/31/2011. The serum M spike was slightly higher on 01/28/2012. He began cycle 4 on 02/03/2012. He developed recurrent hypercalcemia. Treatment was changed to Cytoxan/Velcade/Decadron beginning 03/02/2012. 2. Chronic "dizziness." 3. Chest x-ray 11/01/2008 with a patchy opacity at the right midlung suspicious for pneumonia. He was treated with Avelox. 4. Low back pain with a radicular component. An MRI on 10/07/2008 showed no involvement of the lumbosacral spine with myeloma. 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 5. Bilateral neck pain, status post a CT 05/17/2008 with findings of spinal stenosis and osteoarthritis. 6. 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 7. Red cell microcytosis, likely related to hemoglobin C trait. A ferritin level returned elevated and a stool Hemoccult was negative 01/23/2010. He underwent a colonoscopy in 2009. 8.  Elevated beta-2 microglobulin level. 9. History of hypertension. 10. History of a herniated disk at L4-L5 on MRI an 01/11/2002. 11. Hyperlipidemia. 12. Gastroesophageal reflux disease. 13. History of atypical angina. 14. History of rectal bleeding-large external hemorrhoids noted 02/25/2012. The stool was Hemoccult positive. He has a history of colon polyps. He is  following up with Dr. Leone Payor. 15. Superficial venous thrombosis of the greater saphenous vein on a Doppler 09/27/2008. Negative for deep vein thrombosis. Symptoms improved with aspirin. 16. Neck pain with numbness/tingling in the arms, hands, and low back with proximal right leg weakness. An MRI of the cervical spine 05/30/2009 showed multilevel spondylosis with mild cord edema at C4-C5 and C5-C6. He was noted to have an enhancing lesion of the cord at T3-T4 of unclear etiology. In the lumbar spine there was no change in the spondylosis at L3-L4 and L4-L5. There was no involvement of the cervical or lumbar spine with myeloma. He is status post C4-C5, C5-C6, and C6-C7 anterior cervical diskectomy with fusion by Dr. Jordan Likes 08/01/2009. 17. History of mild thrombocytopenia. 18. Intermittent indeterminate-age deep vein thrombosis of the left lower extremity on a venous Doppler 08/15/2009. There were also findings consistent with superficial thrombosis involving the right lower extremity 08/15/2009. Coumadin was discontinued in October 2011. 19. Type 2 diabetes. 20. History of hematuria, followed at [facility]. Urinalysis was negative for blood on 07/23/2011. 21. Radicular back pain with MRI of the lumbar spine on 10/19/2011 showing nerve root impingement at L2-L3, L3-L4 and L4-L5 with the most severe at the right L3 nerve roots due to a large disc fragment. Associated right leg weakness. He underwent right L2-3 decompressive laminotomy with right L2 and L3 decompressive foraminotomy; right L2-3 microdiscectomy on 11/01/2011. 22. Constipation. Bowels moving. He continues a laxative regimen. 23. Hospitalization 11/08/2011 through 11/10/2011 with orthostatic hypotension/dizziness. He improved with intravenous hydration. He had mild hypotension when here on 11/18/2011. We recommended discontinuing Norvasc and Proscar. 24. Hypercalcemia status post pamidronate 11/03/2011. Recurrent hypercalcemia 02/16/2012,  status post Zometa. The calcium was normal on 02/25/2012, 03/02/2012 and 03/16/2012. 25. Episode of low back pain approximately 1 month ago. He has been referred for an MRI by Dr. Jordan Likes. He knows to contact our office or Dr. Jordan Likes immediately with increased back pain, extremity weakness, bowel/bladder dysfunction.  Disposition-[name] appears stable. Plan to continue Velcade/Cytoxan/Decadron (Velcade is given 3 weeks out of 4; Cytoxan and Decadron are taken weekly). He will return for a followup visit on 04/13/2012. We will repeat the SPEP at that time. He will contact the office in the interim as outlined above or with any other problems.  Plan reviewed with Dr. Truett Perna.    Lonna Cobb ANP/GNP-BC

## 2012-03-16 NOTE — Telephone Encounter (Signed)
Per staff message and POF I have scheduled appts.  JMW  

## 2012-03-16 NOTE — Telephone Encounter (Signed)
gve the pt his sept,oct 2013 appt calendars. Pt is aware his chemo appts will be added. Sent michelle a staff message

## 2012-03-16 NOTE — Patient Instructions (Signed)
BORTEZOMIB (bor TEZ oh mib) is a chemotherapy drug. It slows the growth of cancer cells. This medicine is used to treat multiple myeloma, lymphoma, and other cancers. This medicine may be used for other purposes; ask your health care provider or pharmacist if you have questions. What should I tell my health care provider before I take this medicine? They need to know if you have any of these conditions: -heart disease -irregular heartbeat -liver disease -low blood counts, like low white blood cells, platelets, or hemoglobin -peripheral neuropathy -taking medicine for blood pressure -an unusual or allergic reaction to bortezomib, mannitol, boron, other medicines, foods, dyes, or preservatives -pregnant or trying to get pregnant -breast-feeding How should I use this medicine? This medicine is for injection into a vein or for injection under the skin. It is given by a health care professional in a hospital or clinic setting. Talk to your pediatrician regarding the use of this medicine in children. Special care may be needed. Overdosage: If you think you have taken too much of this medicine contact a poison control center or emergency room at once. NOTE: This medicine is only for you. Do not share this medicine with others. What if I miss a dose? It is important not to miss your dose. Call your doctor or health care professional if you are unable to keep an appointment. What may interact with this medicine? -medicines for diabetes -medicines to increase blood counts like filgrastim, pegfilgrastim, sargramostim -zalcitabine Talk to your doctor or health care professional before taking any of these medicines: -acetaminophen -aspirin -ibuprofen -ketoprofen -naproxen This list may not describe all possible interactions. Give your health care provider a list of all the medicines, herbs, non-prescription drugs, or dietary supplements you use. Also tell them if you smoke, drink alcohol, or use  illegal drugs. Some items may interact with your medicine. What should I watch for while using this medicine? Visit your doctor for checks on your progress. This drug may make you feel generally unwell. This is not uncommon, as chemotherapy can affect healthy cells as well as cancer cells. Report any side effects. Continue your course of treatment even though you feel ill unless your doctor tells you to stop. You may get drowsy or dizzy. Do not drive, use machinery, or do anything that needs mental alertness until you know how this medicine affects you. Do not stand or sit up quickly, especially if you are an older patient. This reduces the risk of dizzy or fainting spells. In some cases, you may be given additional medicines to help with side effects. Follow all directions for their use. Call your doctor or health care professional for advice if you get a fever, chills or sore throat, or other symptoms of a cold or flu. Do not treat yourself. This drug decreases your body's ability to fight infections. Try to avoid being around people who are sick. This medicine may increase your risk to bruise or bleed. Call your doctor or health care professional if you notice any unusual bleeding. Be careful brushing and flossing your teeth or using a toothpick because you may get an infection or bleed more easily. If you have any dental work done, tell your dentist you are receiving this medicine. Avoid taking products that contain aspirin, acetaminophen, ibuprofen, naproxen, or ketoprofen unless instructed by your doctor. These medicines may hide a fever. Do not become pregnant while taking this medicine. Women should inform their doctor if they wish to become pregnant or think they might   be pregnant. There is a potential for serious side effects to an unborn child. Talk to your health care professional or pharmacist for more information. Do not breast-feed an infant while taking this medicine. You may have vomiting  or diarrhea while taking this medicine. Drink water or other fluids as directed. What side effects may I notice from receiving this medicine? Side effects that you should report to your doctor or health care professional as soon as possible: -allergic reactions like skin rash, itching or hives, swelling of the face, lips, or tongue -breathing problems -changes in hearing -changes in vision -fast, irregular heartbeat -feeling faint or lightheaded, falls -pain, tingling, numbness in the hands or feet -seizures -swelling of the ankles, feet, hands -unusual bleeding or bruising -unusually weak or tired -vomiting Side effects that usually do not require medical attention (report to your doctor or health care professional if they continue or are bothersome): -changes in emotions or moods -constipation -diarrhea -loss of appetite -headache -irritation at site where injected -nausea This list may not describe all possible side effects. Call your doctor for medical advice about side effects. You may report side effects to FDA at 1-800-FDA-1088. Where should I keep my medicine? This drug is given in a hospital or clinic and will not be stored at home. NOTE: This sheet is a summary. It may not cover all possible information. If you have questions about this medicine, talk to your doctor, pharmacist, or health care provider.  2012, [employer]. (08/05/2010 11:42:36 AM) 

## 2012-03-17 ENCOUNTER — Other Ambulatory Visit: Payer: Self-pay | Admitting: Neurosurgery

## 2012-03-17 ENCOUNTER — Telehealth: Payer: Self-pay | Admitting: Oncology

## 2012-03-17 DIAGNOSIS — M545 Low back pain: Secondary | ICD-10-CM

## 2012-03-17 NOTE — Telephone Encounter (Signed)
S/w the pt's wife and she is aware of the appt time change on 03/30/2012 to 11:00am for the lab and 11:30am for the infusion

## 2012-03-19 ENCOUNTER — Encounter (HOSPITAL_COMMUNITY): Payer: Self-pay | Admitting: *Deleted

## 2012-03-19 ENCOUNTER — Observation Stay (HOSPITAL_COMMUNITY)
Admission: EM | Admit: 2012-03-19 | Discharge: 2012-03-21 | Disposition: A | Discharge status: Home / Self Care | Payer: Medicare Other | Attending: Internal Medicine | Admitting: Internal Medicine

## 2012-03-19 DIAGNOSIS — K625 Hemorrhage of anus and rectum: Secondary | ICD-10-CM

## 2012-03-19 DIAGNOSIS — I951 Orthostatic hypotension: Secondary | ICD-10-CM

## 2012-03-19 DIAGNOSIS — K921 Melena: Principal | ICD-10-CM | POA: Insufficient documentation

## 2012-03-19 DIAGNOSIS — E871 Hypo-osmolality and hyponatremia: Secondary | ICD-10-CM | POA: Diagnosis present

## 2012-03-19 DIAGNOSIS — N289 Disorder of kidney and ureter, unspecified: Secondary | ICD-10-CM | POA: Diagnosis present

## 2012-03-19 DIAGNOSIS — K5900 Constipation, unspecified: Secondary | ICD-10-CM

## 2012-03-19 DIAGNOSIS — G4733 Obstructive sleep apnea (adult) (pediatric): Secondary | ICD-10-CM | POA: Diagnosis present

## 2012-03-19 DIAGNOSIS — E785 Hyperlipidemia, unspecified: Secondary | ICD-10-CM | POA: Insufficient documentation

## 2012-03-19 DIAGNOSIS — K649 Unspecified hemorrhoids: Secondary | ICD-10-CM

## 2012-03-19 DIAGNOSIS — Z7982 Long term (current) use of aspirin: Secondary | ICD-10-CM | POA: Insufficient documentation

## 2012-03-19 DIAGNOSIS — I10 Essential (primary) hypertension: Secondary | ICD-10-CM | POA: Insufficient documentation

## 2012-03-19 DIAGNOSIS — K644 Residual hemorrhoidal skin tags: Secondary | ICD-10-CM | POA: Insufficient documentation

## 2012-03-19 DIAGNOSIS — Z87891 Personal history of nicotine dependence: Secondary | ICD-10-CM | POA: Insufficient documentation

## 2012-03-19 DIAGNOSIS — K219 Gastro-esophageal reflux disease without esophagitis: Secondary | ICD-10-CM | POA: Insufficient documentation

## 2012-03-19 DIAGNOSIS — M5126 Other intervertebral disc displacement, lumbar region: Secondary | ICD-10-CM | POA: Insufficient documentation

## 2012-03-19 DIAGNOSIS — Z860100 Personal history of colon polyps, unspecified: Secondary | ICD-10-CM

## 2012-03-19 DIAGNOSIS — M5116 Intervertebral disc disorders with radiculopathy, lumbar region: Secondary | ICD-10-CM | POA: Diagnosis present

## 2012-03-19 DIAGNOSIS — G4700 Insomnia, unspecified: Secondary | ICD-10-CM | POA: Diagnosis present

## 2012-03-19 DIAGNOSIS — C9000 Multiple myeloma not having achieved remission: Secondary | ICD-10-CM | POA: Diagnosis present

## 2012-03-19 DIAGNOSIS — Z79899 Other long term (current) drug therapy: Secondary | ICD-10-CM | POA: Insufficient documentation

## 2012-03-19 LAB — COMPREHENSIVE METABOLIC PANEL
AST: 20 U/L (ref 0–37)
Albumin: 2.5 g/dL — ABNORMAL LOW (ref 3.5–5.2)
BUN: 11 mg/dL (ref 6–23)
CO2: 24 mEq/L (ref 19–32)
Calcium: 7.9 mg/dL — ABNORMAL LOW (ref 8.4–10.5)
Chloride: 103 mEq/L (ref 96–112)
Creatinine, Ser: 0.73 mg/dL (ref 0.50–1.35)
GFR calc non Af Amer: 89 mL/min — ABNORMAL LOW (ref >90)
Total Bilirubin: 0.2 mg/dL — ABNORMAL LOW (ref 0.3–1.2)

## 2012-03-19 LAB — URINALYSIS, ROUTINE W REFLEX MICROSCOPIC
Hgb urine dipstick: NEGATIVE
Leukocytes, UA: NEGATIVE
Protein, ur: NEGATIVE mg/dL
Specific Gravity, Urine: 1.02 (ref 1.005–1.030)
Urobilinogen, UA: 0.2 mg/dL (ref 0.0–1.0)

## 2012-03-19 LAB — OCCULT BLOOD, POC DEVICE: Fecal Occult Bld: POSITIVE

## 2012-03-19 LAB — PROTIME-INR: Prothrombin Time: 14 seconds (ref 11.6–15.2)

## 2012-03-19 LAB — CBC WITH DIFFERENTIAL/PLATELET
Basophils Absolute: 0 10*3/uL (ref 0.0–0.1)
HCT: 28.4 % — ABNORMAL LOW (ref 39.0–52.0)
Lymphocytes Relative: 41 % (ref 12–46)
Monocytes Absolute: 0.7 10*3/uL (ref 0.1–1.0)
Neutro Abs: 2.2 10*3/uL (ref 1.7–7.7)
Neutrophils Relative %: 43 % (ref 43–77)
Platelets: 192 10*3/uL (ref 150–400)
RDW: 19 % — ABNORMAL HIGH (ref 11.5–15.5)
WBC: 5.2 10*3/uL (ref 4.0–10.5)

## 2012-03-19 LAB — APTT: aPTT: 25 seconds (ref 24–37)

## 2012-03-19 MED ORDER — ATORVASTATIN CALCIUM 10 MG PO TABS
10.0000 mg | ORAL_TABLET | Freq: Every day | ORAL | Status: DC
  Administered 2012-03-20: 10 mg via ORAL
  Filled 2012-03-19 (×2): qty 1

## 2012-03-19 MED ORDER — ONDANSETRON HCL 4 MG/2ML IJ SOLN: 4.0000 mg | Freq: Four times a day (QID) | INTRAMUSCULAR | Status: DC | PRN

## 2012-03-19 MED ORDER — ACETAMINOPHEN 650 MG RE SUPP: 650.0000 mg | Freq: Four times a day (QID) | RECTAL | Status: DC | PRN

## 2012-03-19 MED ORDER — SODIUM CHLORIDE 0.9 % IJ SOLN: 3.0000 mL | INTRAMUSCULAR | Status: DC | PRN

## 2012-03-19 MED ORDER — ZOLPIDEM TARTRATE 5 MG PO TABS: 5.0000 mg | ORAL_TABLET | Freq: Every evening | ORAL | Status: DC | PRN

## 2012-03-19 MED ORDER — ONDANSETRON HCL 4 MG/2ML IJ SOLN
4.0000 mg | Freq: Once | INTRAMUSCULAR | Status: AC
  Administered 2012-03-19: 4 mg via INTRAVENOUS
  Filled 2012-03-19: qty 2

## 2012-03-19 MED ORDER — SODIUM CHLORIDE 0.9 % IJ SOLN
3.0000 mL | Freq: Two times a day (BID) | INTRAMUSCULAR | Status: DC
  Administered 2012-03-20: 3 mL via INTRAVENOUS

## 2012-03-19 MED ORDER — ACETAMINOPHEN 325 MG PO TABS: 650.0000 mg | ORAL_TABLET | Freq: Four times a day (QID) | ORAL | Status: DC | PRN

## 2012-03-19 MED ORDER — ACYCLOVIR 400 MG PO TABS
400.0000 mg | ORAL_TABLET | Freq: Two times a day (BID) | ORAL | Status: DC
  Administered 2012-03-20 – 2012-03-21 (×4): 400 mg via ORAL
  Filled 2012-03-19 (×5): qty 1

## 2012-03-19 MED ORDER — SODIUM CHLORIDE 0.9 % IV SOLN
1000.0000 mL | INTRAVENOUS | Status: DC
  Administered 2012-03-19 – 2012-03-20 (×2): 1000 mL via INTRAVENOUS

## 2012-03-19 MED ORDER — SODIUM CHLORIDE 0.9 % IV SOLN: 250.0000 mL | INTRAVENOUS | Status: DC | PRN

## 2012-03-19 MED ORDER — HYDROCORTISONE ACETATE 25 MG RE SUPP
25.0000 mg | Freq: Two times a day (BID) | RECTAL | Status: DC
  Administered 2012-03-20 – 2012-03-21 (×3): 25 mg via RECTAL
  Filled 2012-03-19 (×5): qty 1

## 2012-03-19 MED ORDER — PANTOPRAZOLE SODIUM 40 MG PO TBEC
40.0000 mg | DELAYED_RELEASE_TABLET | Freq: Every day | ORAL | Status: DC
  Administered 2012-03-20: 40 mg via ORAL
  Filled 2012-03-19 (×2): qty 1

## 2012-03-19 MED ORDER — ONDANSETRON HCL 4 MG PO TABS: 4.0000 mg | ORAL_TABLET | Freq: Four times a day (QID) | ORAL | Status: DC | PRN

## 2012-03-19 MED ORDER — ALUM & MAG HYDROXIDE-SIMETH 200-200-20 MG/5ML PO SUSP: 30.0000 mL | Freq: Four times a day (QID) | ORAL | Status: DC | PRN

## 2012-03-19 MED ORDER — TAMSULOSIN HCL 0.4 MG PO CAPS
0.4000 mg | ORAL_CAPSULE | Freq: Two times a day (BID) | ORAL | Status: DC
  Administered 2012-03-20 – 2012-03-21 (×4): 0.4 mg via ORAL
  Filled 2012-03-19 (×5): qty 1

## 2012-03-19 NOTE — ED Notes (Signed)
Pt states had a bowel movement x 2 today with large amt bright red blood; denies abd pain or nausea; c/o feeling sleepy and tired; previous history of small last month with workup

## 2012-03-19 NOTE — ED Notes (Signed)
Attempted to call report. Secretary advised that RN will call back when she is able to take report

## 2012-03-19 NOTE — H&P (Signed)
[facility] History and Physical  [name] ZOX:[id number] DOB: 30-Apr-1936 DOA: 03/19/2012   PCP: Redmond Baseman, MD   Chief Complaint:  Chief Complaint  Patient presents with  . Hematochezia     HPI: [name] is a 76 y.o. male with multiple myeloma, on active chemotherapy with Cytoxan dexamethasone presented to the emergency room with complaints of 2 large bloody bowel movements today. He has history of colonic polyps and history of hemorrhoids. He also has remote history of gastric ulcer. Patient denies any epigastric pain, denies any nausea or vomiting. He has been started on fairly high-dose steroids as well as high-dose nonsteroidal inflammatory drugs is the new chemotherapy regimen last month. In the emergency room he was found to be medically stable before for observation for admission.   Review of Systems: Chronic back pain for which the patient underwent back surgery and he is ambulating using a walker The patient denies anorexia, fever, weight loss,, vision loss, decreased hearing, hoarseness, chest pain, syncope, dyspnea on exertion, peripheral edema, balance deficits, hemoptysis, abdominal pain, melena, severe indigestion/heartburn, hematuria, incontinence, genital sores, muscle weakness, suspicious skin lesions, transient blindness, depression, unusual weight change,  enlarged lymph nodes, angioedema,     Past Medical History  Diagnosis Date  . GERD (gastroesophageal reflux disease)   . Hypertension   . BPH (benign prostatic hyperplasia)   . Lumbar herniated disc L4-5  . Hyperlipidemia   . Atypical angina     june 2004  . Sleep apnea   . Multiple myeloma 02/19/2008  . Arthritis   . Myeloma    Past Surgical History  Procedure Date  . Neck surgery   . Lumbar laminectomy/decompression microdiscectomy 11/01/2011    Procedure: LUMBAR LAMINECTOMY/DECOMPRESSION MICRODISCECTOMY 2 LEVELS;  Surgeon: Temple Pacini, MD;  Location: [facility];  Service:  Neurosurgery;  Laterality: Right;  Lumbar Laminectomy/Microdiscectomy Decompression Lumbar Three-Four, Lumbar Four-Five    Social History:  reports that he has quit smoking. His smoking use included Cigarettes. He has a 40 pack-year smoking history. He has never used smokeless tobacco. He reports that he does not drink alcohol or use illicit drugs. The patient lives at home with his wife and he is fairly independent with his activities of daily living  Allergies  Allergen Reactions  . Penicillins Rash    Family History  Problem Relation Age of Onset  . Adopted: Yes     Prior to Admission medications   Medication Sig Start Date End Date Taking? Authorizing Provider  acyclovir (ZOVIRAX) 400 MG tablet Take 1 tablet (400 mg total) by mouth 2 (two) times daily. 02/25/12  Yes Ladene Artist, MD  aspirin 325 MG EC tablet Take 325 mg by mouth 2 (two) times daily.    Yes Historical Provider, MD  atorvastatin (LIPITOR) 10 MG tablet Take 1 tablet by mouth Daily. 02/21/11  Yes Historical Provider, MD  cyclophosphamide (CYTOXAN) 50 MG tablet Take 600 mg by mouth once a week. Give on an empty stomach 1 hour before or 2 hours after meals. 03/30/12  Yes Ladene Artist, MD  dexamethasone (DECADRON) 4 MG tablet Take 5 tablets (20 mg total) by mouth as directed. Take 20 mg by mouth on Thursday not getting dose at [facility] 03/16/12  Yes Ladene Artist, MD  diclofenac (VOLTAREN) 75 MG EC tablet Take 75 mg by mouth 2 (two) times daily.  12/23/11  Yes Historical Provider, MD  hydrocortisone (ANUSOL-HC) 25 MG suppository Place 1 suppository (25 mg total) rectally 2 (  two) times daily. 03/06/12 03/06/13 Yes Meredith Pel, NP  omeprazole (PRILOSEC) 20 MG capsule Take 1 tablet by mouth daily. 08/28/11  Yes Historical Provider, MD  Potassium Chloride (K-TABS PO) Take 20 mEq by mouth daily.   Yes Historical Provider, MD  sorbitol 70 % solution Take 30 mLs by mouth 2 (two) times daily. Decrease to daily when bowels  become regular 10/29/11  Yes Ladene Artist, MD  Tamsulosin HCl (FLOMAX) 0.4 MG CAPS Take 0.4 mg by mouth 2 (two) times daily.  02/12/11  Yes Historical Provider, MD  zolpidem (AMBIEN) 10 MG tablet Take 5 mg by mouth at bedtime as needed. 11/29/11  Yes Waymon Budge, MD   Physical Exam: Filed Vitals:   03/19/12 2130 03/19/12 2145 03/19/12 2200 03/19/12 2215  BP:   132/77   Pulse: 62 63 66 66  Temp:      Resp: 16 21 20 17   SpO2: 95% 92% 93% 93%     General:  Alert and oriented x3, head normocephalic atraumatic  Eyes: pupils equal and round, reactive to light accommodation, extraocular movements intact  ENT: No pharyngeal exudates, no oral lesions  Neck: No jugular venous distention  Cardiovascular: Regular rate and rhythm without murmurs rubs or gallops  Respiratory: Clear to auscultation bilaterally  Abdomen: Soft nontender, rectal exam per the emergency room physician report showing hemorrhoids  Skin: Dry no rashes  Musculoskeletal: Intact   Psychiatric: Euthymic  Neurologic: Cranial nerves 2-12 intact, strength 5 out of 5 in all 4 extremities, sensation intact  Labs on Admission:  Basic Metabolic Panel:  Lab 03/19/12 8295 03/16/12 1301  NA 133* 134*  K 3.8 3.6  CL 103 108*  CO2 24 20*  GLUCOSE 88 113*  BUN 11 10.0  CREATININE 0.73 0.9  CALCIUM 7.9* 8.2*  MG -- --  PHOS -- --   Liver Function Tests:  Lab 03/19/12 1950  AST 20  ALT 18  ALKPHOS 73  BILITOT 0.2*  PROT 7.7  ALBUMIN 2.5*   No results found for this basename: LIPASE:5,AMYLASE:5 in the last 168 hours No results found for this basename: AMMONIA:5 in the last 168 hours CBC:  Lab 03/19/12 1950 03/16/12 1301  WBC 5.2 5.4  NEUTROABS 2.2 3.2  HGB 10.4* 11.0*  HCT 28.4* 31.9*  MCV 77.2* 84.2  PLT 192 196   Cardiac Enzymes: No results found for this basename: CKTOTAL:5,CKMB:5,CKMBINDEX:5,TROPONINI:5 in the last 168 hours  BNP (last 3 results) No results found for this basename: PROBNP:3  in the last 8760 hours CBG: No results found for this basename: GLUCAP:5 in the last 168 hours  Radiological Exams on Admission: No results found.    Assessment/Plan Principal Problem:  *Rectal bleeding Active Problems:  Obstructive sleep apnea  Insomnia with sleep apnea  Multiple myeloma  Lumbar disc herniation with radiculopathy  Renal insufficiency  Hyponatremia  Hemorrhoids  Constipation  History of colon polyps   1. Recurrent hematochezia- etiology is unclear-severity is unclear-given the ongoing usage of steroids and nostril that the vomit or drugs I think it's prudent to observe the patient overnight to make sure he does not develop hemodynamic instability. Most likely etiologies hemorrhoids. Will followup CBCs every 12 hours. If patient stops bleeding he will be scheduled closely with [facility] in the office 2. Multiple myeloma on active chemotherapy-patient will followup with his primary oncologist as soon as possible 3. Obstructive sleep apnea-resume CPAP at night in the hospital 4. Hyponatremia-chronic  Code Status: full code Family  Communication: wife at bedside  Disposition Plan: home  Time spent: 45 minutes  Ernest Orr [facility] Pager [phone]  If 7PM-7AM, please contact night-coverage [email or web address] Password [email or web address] 03/19/2012, 11:11 PM

## 2012-03-19 NOTE — ED Notes (Signed)
Pt placed on cardiac monitor with cycling v/s

## 2012-03-19 NOTE — ED Provider Notes (Signed)
History     CSN: [id number]  Arrival date & time 03/19/12  1842   First MD Initiated Contact with Patient 03/19/12 1911      Chief Complaint  Patient presents with  . Hematochezia    (Consider location/radiation/quality/duration/timing/severity/associated sxs/prior treatment) Patient is a 76 y.o. male presenting with hematochezia. The history is provided by the patient.  Rectal Bleeding  The current episode started today. The onset was sudden. Episode frequency: He has had two episodes. The patient is experiencing no pain. The stool is described as bloody. Pertinent negatives include no fever, no abdominal pain, no nausea, no rectal pain and no vomiting. His past medical history does not include recent abdominal injury or recent antibiotic use. There were no sick contacts. He has received no recent medical care.    Past Medical History  Diagnosis Date  . GERD (gastroesophageal reflux disease)   . Hypertension   . BPH (benign prostatic hyperplasia)   . Lumbar herniated disc L4-5  . Hyperlipidemia   . Atypical angina     june 2004  . Sleep apnea   . Multiple myeloma 02/19/2008  . Arthritis   . Myeloma     Past Surgical History  Procedure Date  . Neck surgery   . Lumbar laminectomy/decompression microdiscectomy 11/01/2011    Procedure: LUMBAR LAMINECTOMY/DECOMPRESSION MICRODISCECTOMY 2 LEVELS;  Surgeon: Temple Pacini, MD;  Location: [facility];  Service: Neurosurgery;  Laterality: Right;  Lumbar Laminectomy/Microdiscectomy Decompression Lumbar Three-Four, Lumbar Four-Five     Family History  Problem Relation Age of Onset  . Adopted: Yes    History  Substance Use Topics  . Smoking status: Former Smoker -- 2.0 packs/day for 20 years    Types: Cigarettes  . Smokeless tobacco: Never Used  . Alcohol Use: No      Review of Systems  Constitutional: Negative for fever.  Gastrointestinal: Positive for hematochezia. Negative for nausea, vomiting, abdominal pain and rectal  pain.  All other systems reviewed and are negative.    Allergies  Penicillins  Home Medications   Current Outpatient Rx  Name Route Sig Dispense Refill  . ACYCLOVIR 400 MG PO TABS Oral Take 1 tablet (400 mg total) by mouth 2 (two) times daily. 60 tablet 3  . ASPIRIN 325 MG PO TBEC Oral Take 325 mg by mouth 2 (two) times daily.     . ATORVASTATIN CALCIUM 10 MG PO TABS Oral Take 1 tablet by mouth Daily.    . CYCLOPHOSPHAMIDE 50 MG PO TABS Oral Take 600 mg by mouth once a week. Give on an empty stomach 1 hour before or 2 hours after meals.    Marland Kitchen DEXAMETHASONE 4 MG PO TABS Oral Take 5 tablets (20 mg total) by mouth as directed. Take 20 mg by mouth on Thursday not getting dose at [facility] 5 tablet 3  . DICLOFENAC SODIUM 75 MG PO TBEC Oral Take 75 mg by mouth 2 (two) times daily.     Marland Kitchen HYDROCORTISONE ACETATE 25 MG RE SUPP Rectal Place 1 suppository (25 mg total) rectally 2 (two) times daily. 20 suppository 1  . OMEPRAZOLE 20 MG PO CPDR Oral Take 1 tablet by mouth daily.    Marland Kitchen K-TABS PO Oral Take 20 mEq by mouth daily.    . SORBITOL 70 % PO SOLN Oral Take 30 mLs by mouth 2 (two) times daily. Decrease to daily when bowels become regular    . TAMSULOSIN HCL 0.4 MG PO CAPS Oral Take 0.4 mg by  mouth 2 (two) times daily.     Marland Kitchen ZOLPIDEM TARTRATE 10 MG PO TABS Oral Take 5 mg by mouth at bedtime as needed.      BP 136/73  Pulse 83  Temp 98.2 F (36.8 C)  Resp 16  SpO2 95%  Physical Exam  Nursing note and vitals reviewed. Constitutional: He appears well-developed and well-nourished. No distress.  HENT:  Head: Normocephalic and atraumatic.  Right Ear: External ear normal.  Left Ear: External ear normal.  Eyes: Conjunctivae are normal. Right eye exhibits no discharge. Left eye exhibits no discharge. No scleral icterus.  Neck: Neck supple. No tracheal deviation present.  Cardiovascular: Normal rate, regular rhythm and intact distal pulses.   Pulmonary/Chest: Effort normal and breath sounds  normal. No stridor. No respiratory distress. He has no wheezes. He has no rales.  Abdominal: Soft. Bowel sounds are normal. He exhibits no distension. There is no tenderness. There is no rebound and no guarding.  Genitourinary: Rectal exam shows tenderness. Guaiac positive stool.       External hemorrhoids, anal tenderness, no definite mass  Musculoskeletal: He exhibits no edema and no tenderness.  Neurological: He is alert. He has normal strength. No sensory deficit. Cranial nerve deficit:  no gross defecits noted. He exhibits normal muscle tone. He displays no seizure activity. Coordination normal.  Skin: Skin is warm and dry. No rash noted.  Psychiatric: He has a normal mood and affect.    ED Course  Procedures (including critical care time)  Labs Reviewed  CBC WITH DIFFERENTIAL - Abnormal; Notable for the following:    RBC 3.68 (*)     Hemoglobin 10.4 (*)     HCT 28.4 (*)     MCV 77.2 (*)     MCHC 36.6 (*)     RDW 19.0 (*)     Monocytes Relative 13 (*)     All other components within normal limits  COMPREHENSIVE METABOLIC PANEL - Abnormal; Notable for the following:    Sodium 133 (*)     Calcium 7.9 (*)     Albumin 2.5 (*)     Total Bilirubin 0.2 (*)     GFR calc non Af Amer 89 (*)     All other components within normal limits  URINALYSIS, ROUTINE W REFLEX MICROSCOPIC  APTT  PROTIME-INR  TYPE AND SCREEN  ABO/RH  OCCULT BLOOD, POC DEVICE   No results found.   1. Rectal bleeding       MDM  Patient presents with rectal bleeding.  He remained hemodynamically stable.  His anemia has worsened. He will be admitted to the hospital for further evaluation and monitoring.        Celene Kras, MD 03/19/12 2212

## 2012-03-20 DIAGNOSIS — E871 Hypo-osmolality and hyponatremia: Secondary | ICD-10-CM

## 2012-03-20 LAB — COMPREHENSIVE METABOLIC PANEL
Albumin: 2.1 g/dL — ABNORMAL LOW (ref 3.5–5.2)
Alkaline Phosphatase: 66 U/L (ref 39–117)
BUN: 10 mg/dL (ref 6–23)
Calcium: 8 mg/dL — ABNORMAL LOW (ref 8.4–10.5)
Creatinine, Ser: 0.71 mg/dL (ref 0.50–1.35)
Potassium: 3.5 mEq/L (ref 3.5–5.1)
Total Protein: 6.7 g/dL (ref 6.0–8.3)

## 2012-03-20 LAB — CBC
HCT: 26.8 % — ABNORMAL LOW (ref 39.0–52.0)
HCT: 30.5 % — ABNORMAL LOW (ref 39.0–52.0)
Hemoglobin: 10.9 g/dL — ABNORMAL LOW (ref 13.0–17.0)
MCHC: 36.6 g/dL — ABNORMAL HIGH (ref 30.0–36.0)
MCV: 77.8 fL — ABNORMAL LOW (ref 78.0–100.0)
RBC: 3.92 MIL/uL — ABNORMAL LOW (ref 4.22–5.81)
RDW: 19 % — ABNORMAL HIGH (ref 11.5–15.5)
WBC: 4.7 10*3/uL (ref 4.0–10.5)

## 2012-03-20 MED ORDER — SODIUM CHLORIDE 0.45 % IV SOLN
INTRAVENOUS | Status: DC
  Administered 2012-03-20: 10 mL/h via INTRAVENOUS

## 2012-03-20 MED ORDER — SODIUM CHLORIDE 0.9 % IV SOLN
INTRAVENOUS | Status: DC
  Administered 2012-03-20: via INTRAVENOUS
  Administered 2012-03-21: 500 mL via INTRAVENOUS

## 2012-03-20 NOTE — Progress Notes (Addendum)
[facility] PROGRESS NOTE  Eilam Shrewsbury WUJ:[id number] DOB: [birth year] DOA: 03/19/2012 PCP: Redmond Baseman, MD  Assessment/Plan: Principal Problem:  *Rectal bleeding Active Problems:  Obstructive sleep apnea  Insomnia with sleep apnea  Multiple myeloma  Lumbar disc herniation with radiculopathy  Renal insufficiency  Hyponatremia  Hemorrhoids  Constipation  History of colon polyps  1. Recurrent hematochezia- etiology is unclear-severity is unclear- Hgb stable. GI consulted for further recs. Pt is anxious to know the source of bleeding 2. Multiple myeloma on active chemotherapy-patient will followup with his primary oncologist as soon as possible 3. Obstructive sleep apnea-resume CPAP at night in the hospital 4. Hyponatremia- stable, chronic  Code Status:full Family Communication: Disposition Plan: home    Brief narrative: Pt is a 76 y.o. male with multiple myeloma, on active chemotherapy with Cytoxan dexamethasone admitted with complaints of 2 large bloody bowel movements. He has history of colonic polyps and history of hemorrhoids. He also has remote history of gastric ulcer. He had been started on fairly high-dose steroids as well as high-dose nonsteroidal inflammatory drugs is the new chemotherapy regimen last month. In the ED his hgb was 9.4 he was admitted for further eval and management.    Consultants:  GI  Procedures:  NONE  Antibiotics:  NONE  HPI/Subjective: Pt denies any further rectal bleeding today. He is anxious to know the source of his bleeding  Objective: Filed Vitals:   03/19/12 2215 03/19/12 2313 03/20/12 0513 03/20/12 1530  BP:  160/92 159/88 147/82  Pulse: 66 68 65 60  Temp:  97.4 F (36.3 C) 97.5 F (36.4 C) 98.2 F (36.8 C)  TempSrc:  Oral Oral   Resp: 17 20 18 17   Height:  6' (1.829 m)    Weight:  107.14 kg (236 lb 3.2 oz)    SpO2: 93% 94% 96% 97%    Intake/Output Summary (Last 24 hours) at 03/20/12 1711 Last data filed  at 03/20/12 1500  Gross per 24 hour  Intake   1080 ml  Output   2050 ml  Net   -970 ml   Filed Weights   03/19/12 2313  Weight: 107.14 kg (236 lb 3.2 oz)    Exam:   General: elderly BM, in NAF  Cardiovascular: RRR, nl S1S2  Respiratory: clear  Abdomen: soft +BS, NT/ND  Data Reviewed: Basic Metabolic Panel:  Lab 03/20/12 9562 03/19/12 1950 03/16/12 1301  NA 133* 133* 134*  K 3.5 3.8 3.6  CL 102 103 108*  CO2 24 24 20*  GLUCOSE 99 88 113*  BUN 10 11 10.0  CREATININE 0.71 0.73 0.9  CALCIUM 8.0* 7.9* 8.2*  MG -- -- --  PHOS -- -- --   Liver Function Tests:  Lab 03/20/12 0135 03/19/12 1950  AST 14 20  ALT 15 18  ALKPHOS 66 73  BILITOT 0.2* 0.2*  PROT 6.7 7.7  ALBUMIN 2.1* 2.5*   No results found for this basename: LIPASE:5,AMYLASE:5 in the last 168 hours No results found for this basename: AMMONIA:5 in the last 168 hours CBC:  Lab 03/20/12 1230 03/20/12 0135 03/19/12 1950 03/16/12 1301  WBC 4.7 4.6 5.2 5.4  NEUTROABS -- -- 2.2 3.2  HGB 10.9* 9.8* 10.4* 11.0*  HCT 30.5* 26.8* 28.4* 31.9*  MCV 77.8* 77.5* 77.2* 84.2  PLT 180 170 192 196   Cardiac Enzymes: No results found for this basename: CKTOTAL:5,CKMB:5,CKMBINDEX:5,TROPONINI:5 in the last 168 hours BNP (last 3 results) No results found for this basename: PROBNP:3 in the last 8760 hours  CBG: No results found for this basename: GLUCAP:5 in the last 168 hours  No results found for this or any previous visit (from the past 240 hour(s)).   Studies: No results found.  Scheduled Meds:   . acyclovir  400 mg Oral BID  . atorvastatin  10 mg Oral q1800  . hydrocortisone  25 mg Rectal BID  . ondansetron (ZOFRAN) IV  4 mg Intravenous Once  . pantoprazole  40 mg Oral Q1200  . sodium chloride  3 mL Intravenous Q12H  . sodium chloride  3 mL Intravenous Q12H  . Tamsulosin HCl  0.4 mg Oral BID   Continuous Infusions:   . sodium chloride 1,000 mL (03/19/12 2009)    Principal Problem:  *Rectal  bleeding Active Problems:  Obstructive sleep apnea  Insomnia with sleep apnea  Multiple myeloma  Lumbar disc herniation with radiculopathy  Renal insufficiency  Hyponatremia  Hemorrhoids  Constipation  History of colon polyps    Time spent: 40    [facility]  [facility] Pager [phone]. If 8PM-8AM, please contact night-coverage at [email or web address], password [facility] 03/20/2012, 5:11 PM  LOS: 1 day

## 2012-03-20 NOTE — Progress Notes (Signed)
Set up patient on CPAP (9cmh2o pressure per home setting) with full face mask. Patient tolerating well at this time. RT will monitor.

## 2012-03-20 NOTE — Consult Note (Signed)
[facility] Consultation  Referring Provider: [facility] Primary Care Physician:  Redmond Baseman, MD Primary Gastroenterologist:   Stan Head, MD Reason for Consultation:  Rectal bleeding  HPI: [name] is a 76 y.o. male with a history of multiple myeloma for which he is currently undergoing treatment under the direction of Dr. Myrle Sheng. Patient is known to Dr. Leone Payor in our practice for a history of colon polyps. His last surveillance colonoscopy was August 2009. Findings included diverticulosis, internal hemorrhoids and 2 small adenomatous polyps. I saw [name] in the office 03/06/12 for painless rectal bleeding. He had passed some red blood with a bowel movement several days prior to our visit. Patient had been constipated. He had been on sorbitol for several months for constipation but stopped it because of gassiness and inconsistent results.  Patient had external hemorroids on exam and internal hemorrhoids were seen on anoscopy. He was given Anusol HC suppositories. Miralax 1-2 times a day was recommended for constipation.  The MiraLax worked well initially but then seemed that cause excessive gas in his stools were not hard, they were not occurring frequently enough. Patient increased MiraLax to twice daily. Yesterday he had a large bowel movement which contained what he describes as a large amount of blood. Later in the evening patient expelled gas which was also associated with a large amount of blood. At this point patient came to ED. Presenting hemoglobin was 10.4.  It was 9.8 at 1:30 this morning then up to 10.9 at 12:30 PM today.  Baseline hemoglobin is around 10.4 to 11.9 range. No abdominal pain. No nausea or vomiting. He has been hemodynamically stable. They should has not had any rectal bleeding nor bowel movements today. He takes a full aspirin on a daily basis. He takes 2 Voltaren on a daily basis.  He is on a daily PPI as well    Past Medical History    Diagnosis Date  . GERD (gastroesophageal reflux disease)   . Hypertension   . BPH (benign prostatic hyperplasia)   . Lumbar herniated disc L4-5  . Hyperlipidemia   . Atypical angina     june 2004  . Sleep apnea   . Multiple myeloma 02/19/2008  . Arthritis   . Myeloma     Past Surgical History  Procedure Date  . Neck surgery   . Lumbar laminectomy/decompression microdiscectomy 11/01/2011    Procedure: LUMBAR LAMINECTOMY/DECOMPRESSION MICRODISCECTOMY 2 LEVELS;  Surgeon: Temple Pacini, MD;  Location: [facility];  Service: Neurosurgery;  Laterality: Right;  Lumbar Laminectomy/Microdiscectomy Decompression Lumbar Three-Four, Lumbar Four-Five     Prior to Admission medications   Medication Sig Start Date End Date Taking? Authorizing Provider  acyclovir (ZOVIRAX) 400 MG tablet Take 1 tablet (400 mg total) by mouth 2 (two) times daily. 02/25/12  Yes Ladene Artist, MD  aspirin 325 MG EC tablet Take 325 mg by mouth 2 (two) times daily.    Yes Historical Provider, MD  atorvastatin (LIPITOR) 10 MG tablet Take 1 tablet by mouth Daily. 02/21/11  Yes Historical Provider, MD  cyclophosphamide (CYTOXAN) 50 MG tablet Take 600 mg by mouth once a week. Give on an empty stomach 1 hour before or 2 hours after meals. 03/30/12  Yes Ladene Artist, MD  dexamethasone (DECADRON) 4 MG tablet Take 5 tablets (20 mg total) by mouth as directed. Take 20 mg by mouth on Thursday not getting dose at [facility] 03/16/12  Yes Ladene Artist, MD  diclofenac (VOLTAREN) 75 MG  EC tablet Take 75 mg by mouth 2 (two) times daily.  12/23/11  Yes Historical Provider, MD  hydrocortisone (ANUSOL-HC) 25 MG suppository Place 1 suppository (25 mg total) rectally 2 (two) times daily. 03/06/12 03/06/13 Yes Meredith Pel, NP  omeprazole (PRILOSEC) 20 MG capsule Take 1 tablet by mouth daily. 08/28/11  Yes Historical Provider, MD  Potassium Chloride (K-TABS PO) Take 20 mEq by mouth daily.   Yes Historical Provider, MD  sorbitol 70 %  solution Take 30 mLs by mouth 2 (two) times daily. Decrease to daily when bowels become regular 10/29/11  Yes Ladene Artist, MD  Tamsulosin HCl (FLOMAX) 0.4 MG CAPS Take 0.4 mg by mouth 2 (two) times daily.  02/12/11  Yes Historical Provider, MD  zolpidem (AMBIEN) 10 MG tablet Take 5 mg by mouth at bedtime as needed. 11/29/11  Yes Waymon Budge, MD    Current Facility-Administered Medications  Medication Dose Route Frequency Provider Last Rate Last Dose  . 0.9 %  sodium chloride infusion  1,000 mL Intravenous Continuous Celene Kras, MD 125 mL/hr at 03/19/12 2009 1,000 mL at 03/19/12 2009  . 0.9 %  sodium chloride infusion  250 mL Intravenous PRN Sorin Luanne Bras, MD      . acetaminophen (TYLENOL) tablet 650 mg  650 mg Oral Q6H PRN Sorin Luanne Bras, MD       Or  . acetaminophen (TYLENOL) suppository 650 mg  650 mg Rectal Q6H PRN Sorin Luanne Bras, MD      . acyclovir (ZOVIRAX) tablet 400 mg  400 mg Oral BID Sorin Luanne Bras, MD   400 mg at 03/20/12 1040  . alum & mag hydroxide-simeth (MAALOX/MYLANTA) 200-200-20 MG/5ML suspension 30 mL  30 mL Oral Q6H PRN Sorin Luanne Bras, MD      . atorvastatin (LIPITOR) tablet 10 mg  10 mg Oral q1800 Sorin Luanne Bras, MD      . hydrocortisone (ANUSOL-HC) suppository 25 mg  25 mg Rectal BID Sorin Luanne Bras, MD   25 mg at 03/20/12 1041  . ondansetron (ZOFRAN) injection 4 mg  4 mg Intravenous Once Celene Kras, MD   4 mg at 03/19/12 2010  . ondansetron (ZOFRAN) tablet 4 mg  4 mg Oral Q6H PRN Sorin Luanne Bras, MD       Or  . ondansetron (ZOFRAN) injection 4 mg  4 mg Intravenous Q6H PRN Sorin Luanne Bras, MD      . pantoprazole (PROTONIX) EC tablet 40 mg  40 mg Oral Q1200 Sorin Luanne Bras, MD   40 mg at 03/20/12 1041  . sodium chloride 0.9 % injection 3 mL  3 mL Intravenous Q12H Sorin Luanne Bras, MD   3 mL at 03/20/12 0005  . sodium chloride 0.9 % injection 3 mL  3 mL Intravenous Q12H Sorin C Laza, MD      . sodium chloride 0.9 % injection 3 mL  3 mL Intravenous PRN Sorin Luanne Bras, MD      . Tamsulosin HCl  (FLOMAX) capsule 0.4 mg  0.4 mg Oral BID Sorin Luanne Bras, MD   0.4 mg at 03/20/12 1041  . zolpidem (AMBIEN) tablet 5 mg  5 mg Oral QHS PRN Sorin Luanne Bras, MD        Allergies as of 03/19/2012 - Review Complete 03/19/2012  Allergen Reaction Noted  . Penicillins Rash 03/05/2011    Family History  Problem Relation Age of Onset  . Adopted: Yes    History  Social History  . Marital Status: Married    Spouse Name: N/A    Number of Children: N/A  . Years of Education: N/A   Occupational History  . retired-disabled-Construction    Social History Main Topics  . Smoking status: Former Smoker -- 2.0 packs/day for 20 years    Types: Cigarettes  . Smokeless tobacco: Never Used  . Alcohol Use: No  . Drug Use: No  . Sexually Active: Not on file    Social History Narrative   Pt was adopted.    Review of Systems: Positive for chronic back pain. All other systems reviewed and negative except where noted in the history of present illness.  PHYSICAL EXAM: Vital signs in last 24 hours: Temp:  [97.4 F (36.3 C)-98.2 F (36.8 C)] 98.2 F (36.8 C) (09/09 1530) Pulse Rate:  [60-83] 60  (09/09 1530) Resp:  [16-24] 17  (09/09 1530) BP: (132-160)/(73-92) 147/82 mmHg (09/09 1530) SpO2:  [92 %-97 %] 97 % (09/09 1530) Weight:  [236 lb 3.2 oz (107.14 kg)] 236 lb 3.2 oz (107.14 kg) (09/08 2313) Last BM Date: 03/19/12 General:   Pleasant black male male in NAD Head:  Normocephalic and atraumatic. Eyes:   No icterus.   Conjunctiva pink. Ears:  Normal auditory acuity. Neck:  Soft mass over her left clavicle (questionable lipoma)  Lungs:  Respirations even and unlabored. Lungs clear to auscultation bilaterally.   No wheezes, crackles, or rhonchi.  Heart:  Regular rate and rhythm Abdomen:  Soft, nondistended, nontender. Normal bowel sounds. No appreciable masses or hepatomegaly.  Rectal:  Large external hemorrhoid. Flecks of dried stool and blood in vault   Msk:  Symmetrical without gross  deformities.  Extremities:  Without edema. Neurologic:  Alert and  oriented x4;  grossly normal neurologically. Skin:  Intact without significant lesions or rashes. Cervical Nodes:  No significant cervical adenopathy. Psych:  Alert and cooperative. Normal affect.  LAB RESULTS:  Basename 03/20/12 1230 03/20/12 0135 03/19/12 1950  WBC 4.7 4.6 5.2  HGB 10.9* 9.8* 10.4*  HCT 30.5* 26.8* 28.4*  PLT 180 170 192   BMET  Basename 03/20/12 0135 03/19/12 1950  NA 133* 133*  K 3.5 3.8  CL 102 103  CO2 24 24  GLUCOSE 99 88  BUN 10 11  CREATININE 0.71 0.73  CALCIUM 8.0* 7.9*   LFT  Basename 03/20/12 0135  PROT 6.7  ALBUMIN 2.1*  AST 14  ALT 15  ALKPHOS 66  BILITOT 0.2*  BILIDIR --  IBILI --   PT/INR  Basename 03/19/12 1950  LABPROT 14.0  INR 1.06     PREVIOUS ENDOSCOPIES: Colonoscopy August 2009 for polyp surveillance, findings included diverticulosis, internal hemorrhoids and 2 small polyps (path compatible with tubular adenoma)   IMPRESSION / PLAN: 1.  Painless rectal bleeding. Suspect hemorrhoidal bleeding. Patient has been hemodynamically stable, hemoglobin has been stable. Patient is not due for surveillance colonoscopy until August 2014 but given recurrent rectal bleeding we could possibly proceed earlier and band internal hemorrhoids at time of procedure. This could be done on an outpatient basis but will review case with  Dr. Arlyce Dice to see if he wants to proceed inpatient.   2. history of adenomatous colon polyps, up-to-date on surveillance colonoscopy with next one being due August 2014..   3..Multiple myeloma, currently undergoing treatment with  Dr. Truett Perna. He gets infusions twice weekly. White blood cells and platelets are in normal range.     Thanks   LOS: 1 day  Willette Cluster  03/20/2012, 4:15 PM

## 2012-03-20 NOTE — Consult Note (Signed)
Chart was reviewed and patient was examined. X-rays were reviewed.   Pt has grade 3 hemorrhoids and likely is bleeding from them.  Diverticular bleeding is less likely.  Doubt neoplasm.  In view of his relatively recent colonoscopy (2009) I think we can proceed with sigmoidoscopy only with the intention of banding the hemorrhoids.     Barbette Hair. Arlyce Dice, M.D., [facility]astroenterology Cell [phone]

## 2012-03-21 ENCOUNTER — Encounter (HOSPITAL_COMMUNITY): Payer: Self-pay | Admitting: Gastroenterology

## 2012-03-21 ENCOUNTER — Other Ambulatory Visit (INDEPENDENT_AMBULATORY_CARE_PROVIDER_SITE_OTHER): Payer: Self-pay | Admitting: General Surgery

## 2012-03-21 ENCOUNTER — Encounter (HOSPITAL_COMMUNITY)
Admission: EM | Disposition: A | Discharge status: Home / Self Care | Payer: Self-pay | Source: Home / Self Care | Attending: Internal Medicine

## 2012-03-21 DIAGNOSIS — C9000 Multiple myeloma not having achieved remission: Secondary | ICD-10-CM

## 2012-03-21 DIAGNOSIS — K649 Unspecified hemorrhoids: Secondary | ICD-10-CM

## 2012-03-21 LAB — CBC
HCT: 27.9 % — ABNORMAL LOW (ref 39.0–52.0)
HCT: 28.5 % — ABNORMAL LOW (ref 39.0–52.0)
HCT: 31.9 % — ABNORMAL LOW (ref 39.0–52.0)
Hemoglobin: 10.2 g/dL — ABNORMAL LOW (ref 13.0–17.0)
Hemoglobin: 10.3 g/dL — ABNORMAL LOW (ref 13.0–17.0)
MCH: 28 pg (ref 26.0–34.0)
MCHC: 36.1 g/dL — ABNORMAL HIGH (ref 30.0–36.0)
MCHC: 36.6 g/dL — ABNORMAL HIGH (ref 30.0–36.0)
MCV: 76.6 fL — ABNORMAL LOW (ref 78.0–100.0)
MCV: 77.1 fL — ABNORMAL LOW (ref 78.0–100.0)
MCV: 77.7 fL — ABNORMAL LOW (ref 78.0–100.0)
RBC: 3.67 MIL/uL — ABNORMAL LOW (ref 4.22–5.81)
RDW: 18.9 % — ABNORMAL HIGH (ref 11.5–15.5)
WBC: 4.3 10*3/uL (ref 4.0–10.5)

## 2012-03-21 LAB — BASIC METABOLIC PANEL
BUN: 5 mg/dL — ABNORMAL LOW (ref 6–23)
CO2: 27 mEq/L (ref 19–32)
Chloride: 102 mEq/L (ref 96–112)
Creatinine, Ser: 0.71 mg/dL (ref 0.50–1.35)

## 2012-03-21 SURGERY — CANCELLED PROCEDURE

## 2012-03-21 MED ORDER — FENTANYL CITRATE 0.05 MG/ML IJ SOLN
INTRAMUSCULAR | Status: DC | PRN
  Administered 2012-03-21 (×2): 25 ug via INTRAVENOUS

## 2012-03-21 MED ORDER — PRAMOXINE HCL 1 % RE FOAM: RECTAL | Status: AC | PRN

## 2012-03-21 MED ORDER — MIDAZOLAM HCL 10 MG/2ML IJ SOLN
INTRAMUSCULAR | Status: DC | PRN
  Administered 2012-03-21 (×2): 2 mg via INTRAVENOUS
  Administered 2012-03-21: 1 mg via INTRAVENOUS

## 2012-03-21 MED ORDER — PSYLLIUM 58.6 % PO POWD: 1.0000 | Freq: Three times a day (TID) | ORAL | Status: DC

## 2012-03-21 NOTE — Progress Notes (Signed)
Patient ID: [name], male   DOB: 1936-03-04, 76 y.o.   MRN: [id number] [name] Progress Note  Subjective: Feels fine -no further bleeding. HGB stable at 10.3  Objective:  Vital signs in last 24 hours: Temp:  [97.2 F (36.2 C)-98.2 F (36.8 C)] 97.7 F (36.5 C) (09/10 0555) Pulse Rate:  [60-67] 64  (09/10 0555) Resp:  [16-18] 18  (09/10 0555) BP: (143-181)/(78-116) 157/91 mmHg (09/10 0558) SpO2:  [93 %-97 %] 93 % (09/10 0555) Last BM Date: 03/19/12 General:   Alert,  Well-developed,    in NAD Heart:  Regular rate and rhythm; no murmurs Pulm;clear Abdomen:  Soft, nontender and nondistended. Normal bowel sounds,  Extremities:  Without edema. Neurologic:  Alert and  oriented x4;  grossly normal neurologically. Psych:  Alert and cooperative. Normal mood and affect.  Intake/Output from previous day: 09/09 0701 - 09/10 0700 In: 3886.3 [P.O.:1080; I.V.:2806.3] Out: 3850 [Urine:3850] Intake/Output this shift: Total I/O In: -  Out: 575 [Urine:575]  Lab Results:  Basename 03/21/12 0430 03/21/12 0005 03/20/12 1230  WBC 4.3 4.8 4.7  HGB 10.3* 10.2* 10.9*  HCT 28.5* 27.9* 30.5*  PLT 162 177 180   BMET  Basename 03/21/12 0430 03/20/12 0135 03/19/12 1950  NA 134* 133* 133*  K 3.6 3.5 3.8  CL 102 102 103  CO2 27 24 24   GLUCOSE 92 99 88  BUN 5* 10 11  CREATININE 0.71 0.71 0.73  CALCIUM 8.3* 8.0* 7.9*   LFT  Basename 03/20/12 0135  PROT 6.7  ALBUMIN 2.1*  AST 14  ALT 15  ALKPHOS 66  BILITOT 0.2*  BILIDIR --  IBILI --   PT/INR  Basename 03/19/12 1950  LABPROT 14.0  INR 1.06    Assessment / Plan: #1  76 yo male with hematochezia- R/O  hemorrhoidal vs low grade diverticular- no further active bleeding For Flex sigmoid today and possible hemorrhoid banding if indicated Pt will need to stay  on Miralax once daily on discharge Please give him an Rx for Vicodin or percocet short term on discharge as well  In event he has pain post banding Should  be OK for discharge later today Follow up DrMarland Kitchen [name]  Prn #2 anemia-stable #3 multiple myeloma Principal Problem:  *Rectal bleeding Active Problems:  Obstructive sleep apnea  Insomnia with sleep apnea  Multiple myeloma  Lumbar disc herniation with radiculopathy  Renal insufficiency  Hyponatremia  Hemorrhoids  Constipation  History of colon polyps     LOS: 2 days   [name]  03/21/2012, 9:13 AM

## 2012-03-21 NOTE — Care Management Note (Unsigned)
    Page 1 of 1   03/21/2012     11:24:35 AM   CARE MANAGEMENT NOTE 03/21/2012  Patient:  [name],[name]   Account Number:  [id number]  Date Initiated:  03/21/2012  Documentation initiated by:  Lanier Clam  Subjective/Objective Assessment:   ADMITTED W/RECTAL BLEED.     Action/Plan:   FROM HOME   Anticipated DC Date:  03/21/2012   Anticipated DC Plan:  HOME/SELF CARE      DC Planning Services  CM consult      Choice offered to / List presented to:             Status of service:  In process, will continue to follow Medicare Important Message given?   (If response is "NO", the following Medicare IM given date fields will be blank) Date Medicare IM given:   Date Additional Medicare IM given:    Discharge Disposition:    Per UR Regulation:  Reviewed for med. necessity/level of care/duration of stay  If discussed at Long Length of Stay Meetings, dates discussed:    Comments:  03/21/12 [name] Yerian RN,BSN NCM [phone] FOR FLEX SIGMOIDOSCOPY TODAY.

## 2012-03-21 NOTE — Progress Notes (Signed)
On digital examination today grade 4 hemorrhoids were seen. These could not be reduced.  Therefore banding procedure was canceled because it is ineffective for external hemorrhoids.  Recommend surgical consult for further therapy.

## 2012-03-21 NOTE — Progress Notes (Signed)
I have personally taken an interval history, reviewed the chart, and examined the patient.  I agree with the extender's note, impression and recommendations.  

## 2012-03-21 NOTE — Discharge Summary (Signed)
Physician Discharge Summary  [name] ZOX:[id number] DOB: [birth year] DOA: 03/19/2012  PCP: Redmond Baseman, MD  Admit date: 03/19/2012 Discharge date: 03/21/2012  Recommendations for Outpatient Follow-up:  Follow-up Information    Follow up with [facility], MD in 3 weeks. (As needed)    Contact information:   [address] [place] [address] [phone]       Follow up with Redmond Baseman, MD. (in 1-2weeks, call for appt upon discharge)    Contact information:   [address] [place] [address] [phone]       Follow up with Melvia Heaps, MD. (GI, as needed)    Contact information:   [address] [facility] [address] [place] [address] [place] [address] [phone]           Discharge Diagnoses:  Principal Problem:  *Rectal bleeding Active Problems:  Obstructive sleep apnea  Insomnia with sleep apnea  Multiple myeloma  Lumbar disc herniation with radiculopathy  Renal insufficiency  Hyponatremia  Hemorrhoids  Constipation  History of colon polyps   Discharge Condition: improved/stable  Diet recommendation: high fiber diet  Filed Weights   03/19/12 2313  Weight: 107.14 kg (236 lb 3.2 oz)    History of present illness:  Pt is a 76 y.o. male with multiple myeloma, on active chemotherapy with Cytoxan dexamethasone admitted with complaints of 2 large bloody bowel movements. He has history of colonic polyps and history of hemorrhoids. He also has remote history of gastric ulcer. He had been started on fairly high-dose steroids as well as high-dose nonsteroidal inflammatory drugs is the new chemotherapy regimen last month. In the ED his hgb was 9.4 he was admitted for further eval and management  Hospital Course:  1. Recurrent hematochezia- -likely secondary to Hemorrhoids per GI.His Hgb was monitored in the hospital and serial h/h remained stable. GI was consulted  for further recs and Dr Arlyce Dice saw pt and found that he had grade 4 hemorrhoids on exam, and stated that banding would be ineffective for these and so the procedure was cancelled and he recommended gen. Surgery consult and this was done. Dr Dwain Sarna saw pt and recommended sitz baths, and proctofoam, then outpt f/u at the office. Pt was then dc'ed home, he  Was also to f/u with his PCP.  2. Multiple myeloma on active chemotherapy-patient will followup with his primary oncologist upon d/c. 3. Obstructive sleep apnea- CPAP at night was resumed in the hospital 4. Hyponatremia- stable, chronic- NA on d/c was 134.  Procedures:  none  Consultations:  GI-  Dr Arlyce Dice  Surgery - Dr Dwain Sarna  Discharge Exam: Filed Vitals:   03/21/12 1349 03/21/12 1350 03/21/12 1400 03/21/12 1432  BP: 160/104 160/104 154/96 175/89  Pulse:    69  Temp:    97.9 F (36.6 C)  TempSrc:      Resp: 17 18 21 17   Height:      Weight:      SpO2: 94% 94% 90% 96%   Exam:  General: elderly BM, in NAD  Cardiovascular: RRR, nl S1S2  Respiratory: clear  Abdomen: soft +BS, NT/ND   Discharge Instructions  Discharge Orders    Future Appointments: Provider: Department: Dept Phone: Center:   03/23/2012 12:15 PM [facility] Mr 2 Gi[facility]315 Mri [phone] [facility] [facility]   03/30/2012 11:00 AM Dava Najjar Idelle Jo [facility] [phone] None   03/30/2012 11:30 AM [facility] B7 [facility] [phone] None  04/04/2012 2:00 PM Iva Boop, MD [facility] [place] Office [phone] [facility]   04/06/2012 10:45 AM Krista Blue [facility] [phone] None   04/06/2012 11:15 AM [facility] I25 Dns [facility] [phone] None   04/13/2012 11:30 AM Windell Hummingbird [facility] [phone] None   04/13/2012 12:00 PM Ladene Artist, MD [facility] [phone] None   04/13/2012 12:45 PM [facility] G24 [facility] [phone] None   08/31/2012 2:00 PM Waymon Budge, MD [facility]  [phone] None     Future Orders Please Complete By Expires   Diet - low sodium heart healthy      Increase activity slowly      Discharge instructions      Comments:   SITZ baths as directed 2X/day     Medication List  As of 03/21/2012  4:25 PM   STOP taking these medications         aspirin 325 MG EC tablet      diclofenac 75 MG EC tablet         TAKE these medications         acyclovir 400 MG tablet   Commonly known as: ZOVIRAX   Take 1 tablet (400 mg total) by mouth 2 (two) times daily.      atorvastatin 10 MG tablet   Commonly known as: LIPITOR   Take 1 tablet by mouth Daily.      cyclophosphamide 50 MG tablet   Commonly known as: CYTOXAN   Take 600 mg by mouth once a week. Give on an empty stomach 1 hour before or 2 hours after meals.   Start taking on: 03/30/2012      dexamethasone 4 MG tablet   Commonly known as: DECADRON   Take 5 tablets (20 mg total) by mouth as directed. Take 20 mg by mouth on Thursday not getting dose at [facility]      hydrocortisone 25 MG suppository   Commonly known as: ANUSOL-HC   Place 1 suppository (25 mg total) rectally 2 (two) times daily.      K-TABS PO   Take 20 mEq by mouth daily.      omeprazole 20 MG capsule   Commonly known as: PRILOSEC   Take 1 tablet by mouth daily.      pramoxine 1 % foam   Commonly known as: PROCTOFOAM   Place rectally every 4 (four) hours as needed for hemorrhoids.      psyllium 58.6 % powder   Commonly known as: METAMUCIL   Take 1 packet by mouth 3 (three) times daily.      sorbitol 70 % solution   Take 30 mLs by mouth 2 (two) times daily. Decrease to daily when bowels become regular      Tamsulosin HCl 0.4 MG Caps   Commonly known as: FLOMAX   Take 0.4 mg by mouth 2 (two) times daily.      zolpidem 10 MG tablet   Commonly known as: AMBIEN   Take 5 mg by mouth at bedtime as needed.           Follow-up Information    Follow up with [facility], MD in 3 weeks. (As needed)     Contact information:   [address] [place] [address] [phone]       Follow up with Redmond Baseman, MD. (in 1-2weeks, call for appt upon discharge)    Contact information:   [address] [employer]  [address] [place] [address] [phone]       Follow up with Melvia Heaps, MD. (GI, as needed)    Contact information:   [address] [facility] [address] [place] [address] [place] [address] [phone]           The results of significant diagnostics from this hospitalization (including imaging, microbiology, ancillary and laboratory) are listed below for reference.    Significant Diagnostic Studies: No results found.  Microbiology: No results found for this or any previous visit (from the past 240 hour(s)).   Labs: Basic Metabolic Panel:  Lab 03/21/12 5784 03/20/12 0135 03/19/12 1950 03/16/12 1301  NA 134* 133* 133* 134*  K 3.6 3.5 3.8 3.6  CL 102 102 103 108*  CO2 27 24 24  20*  GLUCOSE 92 99 88 113*  BUN 5* 10 11 10.0  CREATININE 0.71 0.71 0.73 0.9  CALCIUM 8.3* 8.0* 7.9* 8.2*  MG -- -- -- --  PHOS -- -- -- --   Liver Function Tests:  Lab 03/20/12 0135 03/19/12 1950  AST 14 20  ALT 15 18  ALKPHOS 66 73  BILITOT 0.2* 0.2*  PROT 6.7 7.7  ALBUMIN 2.1* 2.5*   No results found for this basename: LIPASE:5,AMYLASE:5 in the last 168 hours No results found for this basename: AMMONIA:5 in the last 168 hours CBC:  Lab 03/21/12 1230 03/21/12 0430 03/21/12 0005 03/20/12 1230 03/20/12 0135 03/19/12 1950  WBC 4.8 4.3 4.8 4.7 4.6 --  NEUTROABS -- -- -- -- -- 2.2  HGB 11.5* 10.3* 10.2* 10.9* 9.8* --  HCT 31.9* 28.5* 27.9* 30.5* 26.8* --  MCV 77.1* 77.7* 76.6* 77.8* 77.5* --  PLT 176 162 177 180 170 --   Cardiac Enzymes: No results found for this basename: CKTOTAL:5,CKMB:5,CKMBINDEX:5,TROPONINI:5 in the last 168 hours BNP: BNP (last 3 results) No results found for this basename: PROBNP:3  in the last 8760 hours CBG: No results found for this basename: GLUCAP:5 in the last 168 hours  Time coordinating discharge: >30 minutes  Signed:  Kela Millin  [facility] 03/21/2012, 4:25 PM

## 2012-03-21 NOTE — Consult Note (Signed)
He has some prolapsed internal hemorrhoids associated with external hemorrhoids.  There is no stigmata of bleeding now.  His hct has been fine.  I agree with above and will see him back in my office.

## 2012-03-21 NOTE — Progress Notes (Signed)
INITIAL ADULT NUTRITION ASSESSMENT Date: 03/21/2012   Time: 12:48 PM Reason for Assessment: Nutrition Risk- weight loss  ASSESSMENT: Male 76 y.o.  Dx: Rectal bleeding  Hx:  Past Medical History  Diagnosis Date  . GERD (gastroesophageal reflux disease)   . Hypertension   . BPH (benign prostatic hyperplasia)   . Lumbar herniated disc L4-5  . Hyperlipidemia   . Atypical angina     june 2004  . Sleep apnea   . Multiple myeloma 02/19/2008  . Arthritis   . Myeloma    Past Surgical History  Procedure Date  . Neck surgery   . Lumbar laminectomy/decompression microdiscectomy 11/01/2011    Procedure: LUMBAR LAMINECTOMY/DECOMPRESSION MICRODISCECTOMY 2 LEVELS;  Surgeon: Temple Pacini, MD;  Location: [facility];  Service: Neurosurgery;  Laterality: Right;  Lumbar Laminectomy/Microdiscectomy Decompression Lumbar Three-Four, Lumbar Four-Five     Related Meds:     . acyclovir  400 mg Oral BID  . atorvastatin  10 mg Oral q1800  . hydrocortisone  25 mg Rectal BID  . pantoprazole  40 mg Oral Q1200  . sodium chloride  3 mL Intravenous Q12H  . Tamsulosin HCl  0.4 mg Oral BID  . DISCONTD: sodium chloride  3 mL Intravenous Q12H     Ht: 6' (182.9 cm)  Wt: 236 lb 3.2 oz (107.14 kg)  Ideal Wt: 81 kg % Ideal Wt: 132  Usual Wt: 265# before cancer Wt Readings from Last 10 Encounters:  03/19/12 236 lb 3.2 oz (107.14 kg)  03/19/12 236 lb 3.2 oz (107.14 kg)  03/16/12 234 lb 8 oz (106.369 kg)  03/06/12 234 lb (106.142 kg)  02/29/12 233 lb 9.6 oz (105.96 kg)  02/25/12 226 lb 14.4 oz (102.921 kg)  01/28/12 231 lb 4.8 oz (104.917 kg)  12/31/11 232 lb 11.2 oz (105.552 kg)  12/07/11 232 lb 11.2 oz (105.552 kg)  11/29/11 228 lb (103.42 kg)   % Usual Wt: 104% of weight 4 months ago  Body mass index is 32.03 kg/(m^2).  Labs:  CMP     Component Value Date/Time   NA 134* 03/21/2012 0430   NA 134* 03/16/2012 1301   K 3.6 03/21/2012 0430   K 3.6 03/16/2012 1301   CL 102 03/21/2012 0430   CL  108* 03/16/2012 1301   CO2 27 03/21/2012 0430   CO2 20* 03/16/2012 1301   GLUCOSE 92 03/21/2012 0430   GLUCOSE 113* 03/16/2012 1301   BUN 5* 03/21/2012 0430   BUN 10.0 03/16/2012 1301   CREATININE 0.71 03/21/2012 0430   CREATININE 0.9 03/16/2012 1301   CALCIUM 8.3* 03/21/2012 0430   CALCIUM 8.2* 03/16/2012 1301   PROT 6.7 03/20/2012 0135   ALBUMIN 2.1* 03/20/2012 0135   AST 14 03/20/2012 0135   ALT 15 03/20/2012 0135   ALKPHOS 66 03/20/2012 0135   BILITOT 0.2* 03/20/2012 0135   GFRNONAA >90 03/21/2012 0430   GFRAA >90 03/21/2012 0430    I/O last 3 completed shifts: In: 3886.3 [P.O.:1080; I.V.:2806.3] Out: 4550 [Urine:4550] Total I/O In: -  Out: 775 [Urine:775]   Diet Order: NPO  Supplements/Tube Feeding:  none  IVF:    sodium chloride Last Rate: 20 mL/hr at 03/20/12 2330  DISCONTD: sodium chloride Last Rate: 10 mL/hr (03/20/12 1908)  DISCONTD: sodium chloride Last Rate: 1,000 mL (03/20/12 1200)    Estimated Nutritional Needs:   Kcal: 1900-2200 Protein: 100-120 gm Fluid: >1.9L  Food/Nutrition Related Hx: Spoke with pts wife who reported that pt ate very well prior to  admit.  Regular diet but low in salt and sugar.  Weight loss secondary to cancer but has started to regain.  Used to drink Ensure in the am but this increased his potassium.  Currently NPO for tests. BMI meets criteria for obesity grade 1  NUTRITION DIAGNOSIS: -Inadequate oral intake (NI-2.1).  Status: Ongoing  RELATED TO: inability to eat  AS EVIDENCE BY: npo status  MONITORING/EVALUATION(Goals): Intake >90% to meet estimated needs. Monitor:  Intake, weight, diet advancement per MD, labs  EDUCATION NEEDS: -No education needs identified at this time  INTERVENTION: Monitor diet advancement  Dietitian [phone]  DOCUMENTATION CODES Per approved criteria  -Obesity Unspecified    Derrell Lolling Anastasia Fiedler 03/21/2012, 12:48 PM

## 2012-03-21 NOTE — Consult Note (Signed)
[name] 04-Nov-1935  [id number].   Requesting MD: Dr. Melvia Heaps Chief Complaint/Reason for Consult: hemorrhoids HPI: This is a 76 yo male who has multiple myeloma, currently getting chemotherapy, who had some painless rectal bleeding several weeks ago.  He saw the GI PA and was treated with anusol suppositories and Miralax BID.  He did okay for a while, but this past Sunday he had some blood with his BM.  Later that afternoon, he passed flatus and had some bleeding with that too.  He notes that this all started when he was having some constipation issues and passing hard BMs.  He was admitted and evaluated by the GI doctors again.  After their evaluation, they felt he needed a surgical evaluation and we were consulted.  Review of Systems: Please see HPI, otherwise all other systems are currently negative.  Family History  Problem Relation Age of Onset  . Adopted: Yes    Past Medical History  Diagnosis Date  . GERD (gastroesophageal reflux disease)   . Hypertension   . BPH (benign prostatic hyperplasia)   . Lumbar herniated disc L4-5  . Hyperlipidemia   . Atypical angina     june 2004  . Sleep apnea   . Multiple myeloma 02/19/2008  . Arthritis   . Myeloma     Past Surgical History  Procedure Date  . Neck surgery   . Lumbar laminectomy/decompression microdiscectomy 11/01/2011    Procedure: LUMBAR LAMINECTOMY/DECOMPRESSION MICRODISCECTOMY 2 LEVELS;  Surgeon: Temple Pacini, MD;  Location: [facility];  Service: Neurosurgery;  Laterality: Right;  Lumbar Laminectomy/Microdiscectomy Decompression Lumbar Three-Four, Lumbar Four-Five     Social History:  reports that he has quit smoking. His smoking use included Cigarettes. He has a 40 pack-year smoking history. He has never used smokeless tobacco. He reports that he does not drink alcohol or use illicit drugs.  Allergies:  Allergies  Allergen Reactions  . Penicillins Rash    Medications Prior to Admission  Medication Sig  Dispense Refill  . acyclovir (ZOVIRAX) 400 MG tablet Take 1 tablet (400 mg total) by mouth 2 (two) times daily.  60 tablet  3  . aspirin 325 MG EC tablet Take 325 mg by mouth 2 (two) times daily.       Marland Kitchen atorvastatin (LIPITOR) 10 MG tablet Take 1 tablet by mouth Daily.      . cyclophosphamide (CYTOXAN) 50 MG tablet Take 600 mg by mouth once a week. Give on an empty stomach 1 hour before or 2 hours after meals.      Marland Kitchen dexamethasone (DECADRON) 4 MG tablet Take 5 tablets (20 mg total) by mouth as directed. Take 20 mg by mouth on Thursday not getting dose at [facility]  5 tablet  3  . diclofenac (VOLTAREN) 75 MG EC tablet Take 75 mg by mouth 2 (two) times daily.       . hydrocortisone (ANUSOL-HC) 25 MG suppository Place 1 suppository (25 mg total) rectally 2 (two) times daily.  20 suppository  1  . omeprazole (PRILOSEC) 20 MG capsule Take 1 tablet by mouth daily.      . Potassium Chloride (K-TABS PO) Take 20 mEq by mouth daily.      . sorbitol 70 % solution Take 30 mLs by mouth 2 (two) times daily. Decrease to daily when bowels become regular      . Tamsulosin HCl (FLOMAX) 0.4 MG CAPS Take 0.4 mg by mouth 2 (two) times daily.       Marland Kitchen  zolpidem (AMBIEN) 10 MG tablet Take 5 mg by mouth at bedtime as needed.        Blood pressure 175/89, pulse 69, temperature 97.9 F (36.6 C), temperature source Oral, resp. rate 17, height 6' (1.829 m), weight 236 lb 3.2 oz (107.14 kg), SpO2 96.00%. Physical Exam: General: WD, WN 76 yo black male who is sitting up in bed in NAD Rectal: large external hemorrhoid on left side of rectum.  No bleeding currently seen.  These are not tender and are soft with no evidence of thrombosis.  DRE is differed at this time.   Results for orders placed during the hospital encounter of 03/19/12 (from the past 48 hour(s))  CBC WITH DIFFERENTIAL     Status: Abnormal   Collection Time   03/19/12  7:50 PM      Component Value Range Comment   WBC 5.2  4.0 - 10.5 K/uL    RBC 3.68 (*)  4.22 - 5.81 MIL/uL    Hemoglobin 10.4 (*) 13.0 - 17.0 g/dL    HCT 16.1 (*) 09.6 - 52.0 %    MCV 77.2 (*) 78.0 - 100.0 fL    MCH 28.3  26.0 - 34.0 pg    MCHC 36.6 (*) 30.0 - 36.0 g/dL    RDW 04.5 (*) 40.9 - 15.5 %    Platelets 192  150 - 400 K/uL    Neutrophils Relative 43  43 - 77 %    Neutro Abs 2.2  1.7 - 7.7 K/uL    Lymphocytes Relative 41  12 - 46 %    Lymphs Abs 2.1  0.7 - 4.0 K/uL    Monocytes Relative 13 (*) 3 - 12 %    Monocytes Absolute 0.7  0.1 - 1.0 K/uL    Eosinophils Relative 3  0 - 5 %    Eosinophils Absolute 0.2  0.0 - 0.7 K/uL    Basophils Relative 0  0 - 1 %    Basophils Absolute 0.0  0.0 - 0.1 K/uL   COMPREHENSIVE METABOLIC PANEL     Status: Abnormal   Collection Time   03/19/12  7:50 PM      Component Value Range Comment   Sodium 133 (*) 135 - 145 mEq/L    Potassium 3.8  3.5 - 5.1 mEq/L    Chloride 103  96 - 112 mEq/L    CO2 24  19 - 32 mEq/L    Glucose, Bld 88  70 - 99 mg/dL    BUN 11  6 - 23 mg/dL    Creatinine, Ser 8.11  0.50 - 1.35 mg/dL    Calcium 7.9 (*) 8.4 - 10.5 mg/dL    Total Protein 7.7  6.0 - 8.3 g/dL    Albumin 2.5 (*) 3.5 - 5.2 g/dL    AST 20  0 - 37 U/L    ALT 18  0 - 53 U/L    Alkaline Phosphatase 73  39 - 117 U/L    Total Bilirubin 0.2 (*) 0.3 - 1.2 mg/dL    GFR calc non Af Amer 89 (*) >90 mL/min    GFR calc Af Amer >90  >90 mL/min   APTT     Status: Normal   Collection Time   03/19/12  7:50 PM      Component Value Range Comment   aPTT 25  24 - 37 seconds   PROTIME-INR     Status: Normal   Collection Time   03/19/12  7:50 PM  Component Value Range Comment   Prothrombin Time 14.0  11.6 - 15.2 seconds    INR 1.06  0.00 - 1.49   TYPE AND SCREEN     Status: Normal   Collection Time   03/19/12  7:50 PM      Component Value Range Comment   ABO/RH(D) B POS      Antibody Screen NEG      Sample Expiration 03/22/2012     ABO/RH     Status: Normal   Collection Time   03/19/12  8:45 PM      Component Value Range Comment   ABO/RH(D) B POS      URINALYSIS, ROUTINE W REFLEX MICROSCOPIC     Status: Normal   Collection Time   03/19/12  8:58 PM      Component Value Range Comment   Color, Urine YELLOW  YELLOW    APPearance CLEAR  CLEAR    Specific Gravity, Urine 1.020  1.005 - 1.030    pH 5.5  5.0 - 8.0    Glucose, UA NEGATIVE  NEGATIVE mg/dL    Hgb urine dipstick NEGATIVE  NEGATIVE    Bilirubin Urine NEGATIVE  NEGATIVE    Ketones, ur NEGATIVE  NEGATIVE mg/dL    Protein, ur NEGATIVE  NEGATIVE mg/dL    Urobilinogen, UA 0.2  0.0 - 1.0 mg/dL    Nitrite NEGATIVE  NEGATIVE    Leukocytes, UA NEGATIVE  NEGATIVE MICROSCOPIC NOT DONE ON URINES WITH NEGATIVE PROTEIN, BLOOD, LEUKOCYTES, NITRITE, OR GLUCOSE <1000 mg/dL.  OCCULT BLOOD, POC DEVICE     Status: Normal   Collection Time   03/19/12  9:27 PM      Component Value Range Comment   Fecal Occult Bld POSITIVE     CBC     Status: Abnormal   Collection Time   03/20/12  1:35 AM      Component Value Range Comment   WBC 4.6  4.0 - 10.5 K/uL    RBC 3.46 (*) 4.22 - 5.81 MIL/uL    Hemoglobin 9.8 (*) 13.0 - 17.0 g/dL    HCT 16.1 (*) 09.6 - 52.0 %    MCV 77.5 (*) 78.0 - 100.0 fL    MCH 28.3  26.0 - 34.0 pg    MCHC 36.6 (*) 30.0 - 36.0 g/dL    RDW 04.5 (*) 40.9 - 15.5 %    Platelets 170  150 - 400 K/uL   COMPREHENSIVE METABOLIC PANEL     Status: Abnormal   Collection Time   03/20/12  1:35 AM      Component Value Range Comment   Sodium 133 (*) 135 - 145 mEq/L    Potassium 3.5  3.5 - 5.1 mEq/L    Chloride 102  96 - 112 mEq/L    CO2 24  19 - 32 mEq/L    Glucose, Bld 99  70 - 99 mg/dL    BUN 10  6 - 23 mg/dL    Creatinine, Ser 8.11  0.50 - 1.35 mg/dL    Calcium 8.0 (*) 8.4 - 10.5 mg/dL    Total Protein 6.7  6.0 - 8.3 g/dL    Albumin 2.1 (*) 3.5 - 5.2 g/dL    AST 14  0 - 37 U/L    ALT 15  0 - 53 U/L    Alkaline Phosphatase 66  39 - 117 U/L    Total Bilirubin 0.2 (*) 0.3 - 1.2 mg/dL    GFR calc non Af Amer >  90  >90 mL/min    GFR calc Af Amer >90  >90 mL/min   CBC     Status: Abnormal    Collection Time   03/20/12 12:30 PM      Component Value Range Comment   WBC 4.7  4.0 - 10.5 K/uL    RBC 3.92 (*) 4.22 - 5.81 MIL/uL    Hemoglobin 10.9 (*) 13.0 - 17.0 g/dL    HCT 86.5 (*) 78.4 - 52.0 %    MCV 77.8 (*) 78.0 - 100.0 fL    MCH 27.8  26.0 - 34.0 pg    MCHC 35.7  30.0 - 36.0 g/dL    RDW 69.6 (*) 29.5 - 15.5 %    Platelets 180  150 - 400 K/uL   CBC     Status: Abnormal   Collection Time   03/21/12 12:05 AM      Component Value Range Comment   WBC 4.8  4.0 - 10.5 K/uL    RBC 3.64 (*) 4.22 - 5.81 MIL/uL    Hemoglobin 10.2 (*) 13.0 - 17.0 g/dL    HCT 28.4 (*) 13.2 - 52.0 %    MCV 76.6 (*) 78.0 - 100.0 fL    MCH 28.0  26.0 - 34.0 pg    MCHC 36.6 (*) 30.0 - 36.0 g/dL    RDW 44.0 (*) 10.2 - 15.5 %    Platelets 177  150 - 400 K/uL   CBC     Status: Abnormal   Collection Time   03/21/12  4:30 AM      Component Value Range Comment   WBC 4.3  4.0 - 10.5 K/uL    RBC 3.67 (*) 4.22 - 5.81 MIL/uL    Hemoglobin 10.3 (*) 13.0 - 17.0 g/dL    HCT 72.5 (*) 36.6 - 52.0 %    MCV 77.7 (*) 78.0 - 100.0 fL    MCH 28.1  26.0 - 34.0 pg    MCHC 36.1 (*) 30.0 - 36.0 g/dL    RDW 44.0 (*) 34.7 - 15.5 %    Platelets 162  150 - 400 K/uL   BASIC METABOLIC PANEL     Status: Abnormal   Collection Time   03/21/12  4:30 AM      Component Value Range Comment   Sodium 134 (*) 135 - 145 mEq/L    Potassium 3.6  3.5 - 5.1 mEq/L    Chloride 102  96 - 112 mEq/L    CO2 27  19 - 32 mEq/L    Glucose, Bld 92  70 - 99 mg/dL    BUN 5 (*) 6 - 23 mg/dL    Creatinine, Ser 4.25  0.50 - 1.35 mg/dL    Calcium 8.3 (*) 8.4 - 10.5 mg/dL    GFR calc non Af Amer >90  >90 mL/min    GFR calc Af Amer >90  >90 mL/min   CBC     Status: Abnormal   Collection Time   03/21/12 12:30 PM      Component Value Range Comment   WBC 4.8  4.0 - 10.5 K/uL    RBC 4.14 (*) 4.22 - 5.81 MIL/uL    Hemoglobin 11.5 (*) 13.0 - 17.0 g/dL    HCT 95.6 (*) 38.7 - 52.0 %    MCV 77.1 (*) 78.0 - 100.0 fL    MCH 27.8  26.0 - 34.0 pg    MCHC  36.1 (*) 30.0 - 36.0 g/dL    RDW  18.9 (*) 11.5 - 15.5 %    Platelets 176  150 - 400 K/uL    No results found.     Assessment/Plan 1. Large external hemorrhoids 2. BRBPR, secondary to #1 3. Multiple myeloma, on chemo  Plan: 1. There is nothing that needs to acutely be done for these hemorrhoids.  Banding is not an option for external hemorrhoids.  These are not causing the patient any problems except for occasional bleeding.  We have recommended to him to drink water, take fiber, use proctofoam, and sit in a warm bath BID.  He may return to see Dr. Dwain Sarna in the office in 2-3 weeks if needed.  The patient and wife understand and agree.  He is stable for dc from our standpoint.  Ellianne Gowen E 03/21/2012, 3:41 PM

## 2012-03-22 ENCOUNTER — Telehealth (INDEPENDENT_AMBULATORY_CARE_PROVIDER_SITE_OTHER): Payer: Self-pay | Admitting: General Surgery

## 2012-03-22 NOTE — Telephone Encounter (Signed)
Called p,t and spoke with wife, regarding Protofoam ordered at his discharge from the hospital.  His insurance will not cover the cost.  Dr. Dwain Sarna stated to use OTC med, like Preparation H, following their instructions.  Wife understands.

## 2012-03-23 ENCOUNTER — Ambulatory Visit
Admission: RE | Admit: 2012-03-23 | Discharge: 2012-03-23 | Disposition: A | Discharge status: Home / Self Care | Payer: Medicare Other | Source: Ambulatory Visit | Attending: Neurosurgery | Admitting: Neurosurgery

## 2012-03-23 DIAGNOSIS — M545 Low back pain: Secondary | ICD-10-CM

## 2012-03-23 MED ORDER — GADOBENATE DIMEGLUMINE 529 MG/ML IV SOLN
20.0000 mL | Freq: Once | INTRAVENOUS | Status: AC | PRN
  Administered 2012-03-23: 20 mL via INTRAVENOUS

## 2012-03-27 NOTE — Telephone Encounter (Signed)
RECEIVED A FAX FROM [employer] CONCERNING A CONFIRMATION OF PRESCRIPTION SHIPMENT FOR CYCLOPHOSPHAMIDE ON 03/24/12.

## 2012-03-29 ENCOUNTER — Other Ambulatory Visit: Payer: Self-pay | Admitting: Oncology

## 2012-03-30 ENCOUNTER — Other Ambulatory Visit (HOSPITAL_BASED_OUTPATIENT_CLINIC_OR_DEPARTMENT_OTHER): Payer: Medicare Other | Admitting: Lab

## 2012-03-30 ENCOUNTER — Ambulatory Visit (HOSPITAL_BASED_OUTPATIENT_CLINIC_OR_DEPARTMENT_OTHER): Payer: Medicare Other

## 2012-03-30 VITALS — BP 116/73 | HR 83 | Temp 97.6°F | Resp 18

## 2012-03-30 DIAGNOSIS — Z5112 Encounter for antineoplastic immunotherapy: Secondary | ICD-10-CM

## 2012-03-30 DIAGNOSIS — C9000 Multiple myeloma not having achieved remission: Secondary | ICD-10-CM

## 2012-03-30 LAB — CBC WITH DIFFERENTIAL/PLATELET
Basophils Absolute: 0 10*3/uL (ref 0.0–0.1)
EOS%: 1.4 % (ref 0.0–7.0)
HGB: 11.9 g/dL — ABNORMAL LOW (ref 13.0–17.1)
LYMPH%: 26.9 % (ref 14.0–49.0)
MCH: 29.3 pg (ref 27.2–33.4)
MCV: 85 fL (ref 79.3–98.0)
MONO%: 12.9 % (ref 0.0–14.0)
Platelets: 162 10*3/uL (ref 140–400)
RDW: 21.1 % — ABNORMAL HIGH (ref 11.0–14.6)

## 2012-03-30 MED ORDER — BORTEZOMIB CHEMO SQ INJECTION 3.5 MG (2.5MG/ML)
1.3000 mg/m2 | Freq: Once | INTRAMUSCULAR | Status: AC
  Administered 2012-03-30: 3 mg via SUBCUTANEOUS
  Filled 2012-03-30: qty 3

## 2012-03-30 MED ORDER — ONDANSETRON HCL 8 MG PO TABS
8.0000 mg | ORAL_TABLET | Freq: Once | ORAL | Status: AC
  Administered 2012-03-30: 8 mg via ORAL

## 2012-03-30 MED ORDER — DEXAMETHASONE 4 MG PO TABS
40.0000 mg | ORAL_TABLET | Freq: Once | ORAL | Status: AC
  Administered 2012-03-30: 40 mg via ORAL

## 2012-04-04 ENCOUNTER — Ambulatory Visit: Payer: Medicare Other | Admitting: Internal Medicine

## 2012-04-04 ENCOUNTER — Other Ambulatory Visit: Payer: Self-pay | Admitting: Oncology

## 2012-04-06 ENCOUNTER — Ambulatory Visit (HOSPITAL_BASED_OUTPATIENT_CLINIC_OR_DEPARTMENT_OTHER): Payer: Medicare Other

## 2012-04-06 ENCOUNTER — Ambulatory Visit: Payer: Medicare Other

## 2012-04-06 ENCOUNTER — Other Ambulatory Visit (HOSPITAL_BASED_OUTPATIENT_CLINIC_OR_DEPARTMENT_OTHER): Payer: Medicare Other | Admitting: Lab

## 2012-04-06 VITALS — BP 138/80 | HR 82 | Temp 96.0°F | Resp 18

## 2012-04-06 DIAGNOSIS — Z5112 Encounter for antineoplastic immunotherapy: Secondary | ICD-10-CM

## 2012-04-06 DIAGNOSIS — C9000 Multiple myeloma not having achieved remission: Secondary | ICD-10-CM

## 2012-04-06 LAB — CBC WITH DIFFERENTIAL/PLATELET
BASO%: 0.2 % (ref 0.0–2.0)
EOS%: 0.1 % (ref 0.0–7.0)
Eosinophils Absolute: 0 10*3/uL (ref 0.0–0.5)
LYMPH%: 18.8 % (ref 14.0–49.0)
MCH: 29.6 pg (ref 27.2–33.4)
MCHC: 34.5 g/dL (ref 32.0–36.0)
MCV: 85.8 fL (ref 79.3–98.0)
MONO%: 14.4 % — ABNORMAL HIGH (ref 0.0–14.0)
NEUT#: 4.5 10*3/uL (ref 1.5–6.5)
RBC: 3.99 10*6/uL — ABNORMAL LOW (ref 4.20–5.82)
RDW: 20.5 % — ABNORMAL HIGH (ref 11.0–14.6)

## 2012-04-06 MED ORDER — BORTEZOMIB CHEMO SQ INJECTION 3.5 MG (2.5MG/ML)
1.3000 mg/m2 | Freq: Once | INTRAMUSCULAR | Status: AC
  Administered 2012-04-06: 3 mg via SUBCUTANEOUS
  Filled 2012-04-06: qty 3

## 2012-04-06 MED ORDER — DEXAMETHASONE 4 MG PO TABS
40.0000 mg | ORAL_TABLET | Freq: Once | ORAL | Status: AC
  Administered 2012-04-06: 40 mg via ORAL

## 2012-04-06 MED ORDER — ONDANSETRON HCL 8 MG PO TABS
8.0000 mg | ORAL_TABLET | Freq: Once | ORAL | Status: AC
  Administered 2012-04-06: 8 mg via ORAL

## 2012-04-06 NOTE — Patient Instructions (Signed)
[facility] Discharge Instructions for Patients Receiving Chemotherapy  Today you received the following chemotherapy agents :  Velcade.  To help prevent nausea and vomiting after your treatment, we encourage you to take your nausea medication as directed.   If you develop nausea and vomiting that is not controlled by your nausea medication, call the clinic. If it is after clinic hours your family physician or the after hours number for the clinic or go to the Emergency Department.   BELOW ARE SYMPTOMS THAT SHOULD BE REPORTED IMMEDIATELY:  *FEVER GREATER THAN 100.5 F  *CHILLS WITH OR WITHOUT FEVER  NAUSEA AND VOMITING THAT IS NOT CONTROLLED WITH YOUR NAUSEA MEDICATION  *UNUSUAL SHORTNESS OF BREATH  *UNUSUAL BRUISING OR BLEEDING  TENDERNESS IN MOUTH AND THROAT WITH OR WITHOUT PRESENCE OF ULCERS  *URINARY PROBLEMS  *BOWEL PROBLEMS  UNUSUAL RASH Items with * indicate a potential emergency and should be followed up as soon as possible.   Feel free to call the clinic you have any questions or concerns. The clinic phone number is [phone].   I have been informed and understand all the instructions given to me. I know to contact the clinic, my physician, or go to the Emergency Department if any problems should occur. I do not have any questions at this time, but understand that I may call the clinic during office hours   should I have any questions or need assistance in obtaining follow up care.    __________________________________________  _____________  __________ Signature of Patient or Authorized Representative            Date                   Time    __________________________________________ Nurse's Signature    

## 2012-04-12 ENCOUNTER — Other Ambulatory Visit: Payer: Self-pay | Admitting: Oncology

## 2012-04-13 ENCOUNTER — Ambulatory Visit (HOSPITAL_BASED_OUTPATIENT_CLINIC_OR_DEPARTMENT_OTHER): Payer: Medicare Other | Admitting: Oncology

## 2012-04-13 ENCOUNTER — Ambulatory Visit (HOSPITAL_BASED_OUTPATIENT_CLINIC_OR_DEPARTMENT_OTHER): Payer: Medicare Other

## 2012-04-13 ENCOUNTER — Other Ambulatory Visit (HOSPITAL_BASED_OUTPATIENT_CLINIC_OR_DEPARTMENT_OTHER): Payer: Medicare Other | Admitting: Lab

## 2012-04-13 ENCOUNTER — Telehealth: Payer: Self-pay | Admitting: Oncology

## 2012-04-13 ENCOUNTER — Other Ambulatory Visit: Payer: Self-pay | Admitting: *Deleted

## 2012-04-13 VITALS — BP 136/90 | HR 78 | Temp 97.7°F | Resp 20 | Ht 72.0 in | Wt 236.5 lb

## 2012-04-13 DIAGNOSIS — D630 Anemia in neoplastic disease: Secondary | ICD-10-CM

## 2012-04-13 DIAGNOSIS — C9000 Multiple myeloma not having achieved remission: Secondary | ICD-10-CM

## 2012-04-13 DIAGNOSIS — I82819 Embolism and thrombosis of superficial veins of unspecified lower extremities: Secondary | ICD-10-CM

## 2012-04-13 DIAGNOSIS — Z5112 Encounter for antineoplastic immunotherapy: Secondary | ICD-10-CM

## 2012-04-13 LAB — CBC WITH DIFFERENTIAL/PLATELET
BASO%: 0.3 % (ref 0.0–2.0)
Eosinophils Absolute: 0 10*3/uL (ref 0.0–0.5)
MCHC: 34.8 g/dL (ref 32.0–36.0)
MONO#: 0.8 10*3/uL (ref 0.1–0.9)
NEUT#: 2.9 10*3/uL (ref 1.5–6.5)
RBC: 3.93 10*6/uL — ABNORMAL LOW (ref 4.20–5.82)
RDW: 21 % — ABNORMAL HIGH (ref 11.0–14.6)
WBC: 5 10*3/uL (ref 4.0–10.3)
lymph#: 1.3 10*3/uL (ref 0.9–3.3)

## 2012-04-13 LAB — COMPREHENSIVE METABOLIC PANEL (CC13)
ALT: 11 U/L (ref 0–55)
Albumin: 2.9 g/dL — ABNORMAL LOW (ref 3.5–5.0)
CO2: 22 mEq/L (ref 22–29)
Glucose: 94 mg/dl (ref 70–99)
Potassium: 4 mEq/L (ref 3.5–5.1)
Sodium: 135 mEq/L — ABNORMAL LOW (ref 136–145)
Total Bilirubin: 0.6 mg/dL (ref 0.20–1.20)
Total Protein: 8 g/dL (ref 6.4–8.3)

## 2012-04-13 MED ORDER — ONDANSETRON HCL 8 MG PO TABS
8.0000 mg | ORAL_TABLET | Freq: Once | ORAL | Status: AC
  Administered 2012-04-13: 8 mg via ORAL

## 2012-04-13 MED ORDER — DEXAMETHASONE 4 MG PO TABS
40.0000 mg | ORAL_TABLET | Freq: Once | ORAL | Status: AC
  Administered 2012-04-13: 40 mg via ORAL

## 2012-04-13 MED ORDER — BORTEZOMIB CHEMO SQ INJECTION 3.5 MG (2.5MG/ML)
1.3000 mg/m2 | Freq: Once | INTRAMUSCULAR | Status: AC
  Administered 2012-04-13: 3 mg via SUBCUTANEOUS
  Filled 2012-04-13: qty 3

## 2012-04-13 NOTE — Patient Instructions (Addendum)
[facility] Discharge Instructions for Patients Receiving Chemotherapy  Today you received the following chemotherapy agents;  Velcade.  To help prevent nausea and vomiting after your treatment, we encourage you to take your nausea medication.  Call Dr. Kalman Drape nurse to check on nausea medication and your decadron tomorrow if you have not heard anything yet.  If you develop nausea and vomiting that is not controlled by your nausea medication, call the clinic. If it is after clinic hours your family physician or the after hours number for the clinic or go to the Emergency Department.   BELOW ARE SYMPTOMS THAT SHOULD BE REPORTED IMMEDIATELY:  *FEVER GREATER THAN 100.5 F  *CHILLS WITH OR WITHOUT FEVER  NAUSEA AND VOMITING THAT IS NOT CONTROLLED WITH YOUR NAUSEA MEDICATION  *UNUSUAL SHORTNESS OF BREATH  *UNUSUAL BRUISING OR BLEEDING  TENDERNESS IN MOUTH AND THROAT WITH OR WITHOUT PRESENCE OF ULCERS  *URINARY PROBLEMS  *BOWEL PROBLEMS  UNUSUAL RASH Items with * indicate a potential emergency and should be followed up as soon as possible.   Feel free to call the clinic you have any questions or concerns. The clinic phone number is [phone].   I have been informed and understand all the instructions given to me. I know to contact the clinic, my physician, or go to the Emergency Department if any problems should occur. I do not have any questions at this time, but understand that I may call the clinic during office hours   should I have any questions or need assistance in obtaining follow up care.    __________________________________________  _____________  __________ Signature of Patient or Authorized Representative            Date                   Time    __________________________________________ Nurse's Signature

## 2012-04-13 NOTE — Telephone Encounter (Signed)
gv and printed schedule for pt. °

## 2012-04-13 NOTE — Progress Notes (Signed)
[facility]    OFFICE PROGRESS NOTE   INTERVAL HISTORY:   He returns as scheduled. He continues treatment with Cytoxan/Velcade/Decadron. He is tolerating the chemotherapy well. He was admitted on 03/19/2012 with an episode of rectal bleeding. He was diagnosed with "hemorrhoids ". No recurrent bleeding. He reports intermittent episodes of discomfort at the right anterior chest. He wonders whether this is associated with the CPAP machine. No leg swelling or erythema.  Objective:  Vital signs in last 24 hours:  Blood pressure 136/90, pulse 78, temperature 97.7 F (36.5 C), temperature source Oral, resp. rate 20, height 6' (1.829 m), weight 236 lb 8 oz (107.276 kg).    HEENT: No thrush or ulcers Resp: Lungs clear bilaterally Cardio: Regular rate and rhythm GI: No hepatosplenomegaly, nontender Vascular: No leg edema  Lab Results:  Lab Results  Component Value Date   WBC 5.0 04/13/2012   HGB 11.7* 04/13/2012   HCT 33.6* 04/13/2012   MCV 85.4 04/13/2012   PLT 131* 04/13/2012   ANC 2.9 Serum M spike 2.79 on 02/25/2012   Medications: I have reviewed the patient's current medications.  Assessment/Plan: 1. Multiple myeloma, status post treatment with melphalan/prednisone/thalidomide beginning in November 2009. The thalidomide was increased to 200 mg daily beginning 09/11/2008. The serum M spike was slightly improved on 03/18/2009 and slightly increased on 05/09/2009. He has been maintained off of specific therapy for multiple myeloma since September 2010. The serum M spike was increased on 10/20/2011 at 4.6. He began Revlimid on 11/11/2011. He takes weekly dexamethasone. He began cycle 2 of Revlimid on 12/09/2011. The serum M spike was improved on 12/31/2011. The serum M spike was slightly higher on 01/28/2012. He began cycle 4 on 02/03/2012. He developed recurrent hypercalcemia. Treatment was changed to Cytoxan/Velcade/Decadron beginning 03/02/2012. The serum M spike was  lower on 02/25/2012. 2. Chronic "dizziness." 3. Chest x-ray 11/01/2008 with a patchy opacity at the right midlung suspicious for pneumonia. He was treated with Avelox. 4. Low back pain with a radicular component. An MRI on 10/07/2008 showed no involvement of the lumbosacral spine with myeloma. 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 5. Bilateral neck pain, status post a CT 05/17/2008 with findings of spinal stenosis and osteoarthritis. 6. 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 Red cell microcytosis, likely related to hemoglobin C trait. A ferritin level returned elevated and a stool Hemoccult was negative 01/23/2010. He underwent a colonoscopy in 2009. 8. Elevated beta-2 microglobulin level. 9. History of hypertension. 10. History of a herniated disk at L4-L5 on MRI an 01/11/2002. 11. Hyperlipidemia. 12. Gastroesophageal reflux disease. 13. History of atypical angina. 14. History of rectal bleeding-large external hemorrhoids noted 02/25/2012. The stool was Hemoccult positive. He has a history of colon polyps. He is followed by [facility]. He was diagnosed with hemorrhoids in September of 2013 while hospitalized 15. Superficial venous thrombosis of the greater saphenous vein on a Doppler 09/27/2008. Negative for deep vein thrombosis. Symptoms improved with aspirin. 16. Neck pain with numbness/tingling in the arms, hands, and low back with proximal right leg weakness. An MRI of the cervical spine 05/30/2009 showed multilevel spondylosis with mild cord edema at C4-C5 and C5-C6. He was noted to have an enhancing lesion of the cord at T3-T4 of unclear etiology. In the lumbar spine there was no change in the spondylosis at L3-L4 and L4-L5. There was no involvement of  the cervical or lumbar spine with myeloma. He is status post C4-C5, C5-C6, and  C6-C7 anterior cervical diskectomy with fusion by Dr. Jordan Likes 08/01/2009. 17. History of mild thrombocytopenia. 18. Intermittent indeterminate-age deep vein thrombosis of the left lower extremity on a venous Doppler 08/15/2009. There were also findings consistent with superficial thrombosis involving the right lower extremity 08/15/2009. Coumadin was discontinued in October 2011. 19. Type 2 diabetes. 20. History of hematuria, followed at [facility]. Urinalysis was negative for blood on 07/23/2011. 21. Radicular back pain with MRI of the lumbar spine on 10/19/2011 showing nerve root impingement at L2-L3, L3-L4 and L4-L5 with the most severe at the right L3 nerve roots due to a large disc fragment. Associated right leg weakness. He underwent right L2-3 decompressive laminotomy with right L2 and L3 decompressive foraminotomy; right L2-3 microdiscectomy on 11/01/2011. 22. Constipation He continues a laxative regimen. 23. Hospitalization 11/08/2011 through 11/10/2011 with orthostatic hypotension/dizziness. He improved with intravenous hydration. He had mild hypotension when here on 11/18/2011. We recommended discontinuing Norvasc and Proscar.       24. Hypercalcemia status post pamidronate 11/03/2011. Recurrent hypercalcemia 02/16/2012, status post Zometa. The calcium has remained normal range.  Disposition:  Mr. Bur appears stable. We will followup on the serum electrophoresis from today. He will continue the Cytoxan/Velcade/Decadron. Mr. Johns has received an influenza vaccine. He will resume aspirin therapy. Mr. Maraldo will return for an office visit on 05/11/2012.   Thornton Papas, MD  04/13/2012  1:40 PM

## 2012-04-14 ENCOUNTER — Other Ambulatory Visit: Payer: Self-pay | Admitting: *Deleted

## 2012-04-14 DIAGNOSIS — C9000 Multiple myeloma not having achieved remission: Secondary | ICD-10-CM

## 2012-04-14 MED ORDER — PROCHLORPERAZINE MALEATE 10 MG PO TABS: 10.0000 mg | ORAL_TABLET | Freq: Four times a day (QID) | ORAL | Status: DC | PRN

## 2012-04-14 NOTE — Telephone Encounter (Signed)
Confirmed with [facility] that next refill is due 05/04/12-they will send refill request next week. Confirmed with his [facility] that he still had refill on his Decadron-they will prepare this for him now. E-scribed Compazine refill. Notified Mrs. Shawnie Pons.

## 2012-04-14 NOTE — Telephone Encounter (Signed)
Saw MD 10/3 and forgot to request refills on cytoxan, decadron and antiemetic. Will be needed for next week.

## 2012-04-17 LAB — PROTEIN ELECTROPHORESIS, SERUM
Albumin ELP: 41.7 % — ABNORMAL LOW (ref 55.8–66.1)
Beta 2: 3 % — ABNORMAL LOW (ref 3.2–6.5)
Beta Globulin: 4.2 % — ABNORMAL LOW (ref 4.7–7.2)
M-Spike, %: 2.98 g/dL
Total Protein, Serum Electrophoresis: 8 g/dL (ref 6.0–8.3)

## 2012-04-19 ENCOUNTER — Telehealth: Payer: Self-pay | Admitting: *Deleted

## 2012-04-19 NOTE — Telephone Encounter (Signed)
Refill request received from [facility] for cyclophosphamide 50 mg tablets.  Request to MD for review.

## 2012-04-21 ENCOUNTER — Other Ambulatory Visit: Payer: Self-pay | Admitting: *Deleted

## 2012-04-21 MED ORDER — CYCLOPHOSPHAMIDE 50 MG PO TABS: 600.0000 mg | ORAL_TABLET | ORAL | Status: DC

## 2012-04-23 ENCOUNTER — Other Ambulatory Visit: Payer: Self-pay | Admitting: Oncology

## 2012-04-24 ENCOUNTER — Other Ambulatory Visit: Payer: Self-pay | Admitting: *Deleted

## 2012-04-24 NOTE — Telephone Encounter (Addendum)
RECEIVED A FAX FROM [facility] CONCERNING A CONFIRMATION OF FACSIMILE RECEIPT FOR PT.'S REFERRAL. 

## 2012-04-25 NOTE — Telephone Encounter (Signed)
RECEIVED A FAX FROM [facility] CONCERNING A CONFIRMATION OF PRESCRIPTION SHIPMENT FOR CYCLOPHOSPHAMIDE.

## 2012-04-27 ENCOUNTER — Other Ambulatory Visit (HOSPITAL_BASED_OUTPATIENT_CLINIC_OR_DEPARTMENT_OTHER): Payer: Medicare Other | Admitting: Lab

## 2012-04-27 ENCOUNTER — Ambulatory Visit (HOSPITAL_BASED_OUTPATIENT_CLINIC_OR_DEPARTMENT_OTHER): Payer: Medicare Other

## 2012-04-27 VITALS — BP 127/75 | HR 79 | Temp 97.6°F

## 2012-04-27 DIAGNOSIS — Z5112 Encounter for antineoplastic immunotherapy: Secondary | ICD-10-CM

## 2012-04-27 DIAGNOSIS — C9000 Multiple myeloma not having achieved remission: Secondary | ICD-10-CM

## 2012-04-27 LAB — CBC WITH DIFFERENTIAL/PLATELET
BASO%: 0.5 % (ref 0.0–2.0)
EOS%: 1.5 % (ref 0.0–7.0)
HCT: 33.6 % — ABNORMAL LOW (ref 38.4–49.9)
MCH: 29.9 pg (ref 27.2–33.4)
MCHC: 34.3 g/dL (ref 32.0–36.0)
MONO#: 0.8 10*3/uL (ref 0.1–0.9)
NEUT%: 58.4 % (ref 39.0–75.0)
RBC: 3.86 10*6/uL — ABNORMAL LOW (ref 4.20–5.82)
WBC: 5.5 10*3/uL (ref 4.0–10.3)
lymph#: 1.3 10*3/uL (ref 0.9–3.3)

## 2012-04-27 MED ORDER — DEXAMETHASONE 4 MG PO TABS
40.0000 mg | ORAL_TABLET | Freq: Once | ORAL | Status: AC
  Administered 2012-04-27: 40 mg via ORAL

## 2012-04-27 MED ORDER — BORTEZOMIB CHEMO SQ INJECTION 3.5 MG (2.5MG/ML)
1.3000 mg/m2 | Freq: Once | INTRAMUSCULAR | Status: AC
  Administered 2012-04-27: 3 mg via SUBCUTANEOUS
  Filled 2012-04-27: qty 3

## 2012-04-27 MED ORDER — ONDANSETRON HCL 8 MG PO TABS
8.0000 mg | ORAL_TABLET | Freq: Once | ORAL | Status: AC
  Administered 2012-04-27: 8 mg via ORAL

## 2012-04-27 NOTE — Patient Instructions (Addendum)
[facility] Discharge Instructions for Patients Receiving Chemotherapy  Today you received the following chemotherapy agents Velcade To help prevent nausea and vomiting after your treatment, we encourage you to take your nausea medication as prescribed.   BELOW ARE SYMPTOMS THAT SHOULD BE REPORTED IMMEDIATELY:  *FEVER GREATER THAN 100.5 F  *CHILLS WITH OR WITHOUT FEVER  NAUSEA AND VOMITING THAT IS NOT CONTROLLED WITH YOUR NAUSEA MEDICATION  *UNUSUAL SHORTNESS OF BREATH  *UNUSUAL BRUISING OR BLEEDING  TENDERNESS IN MOUTH AND THROAT WITH OR WITHOUT PRESENCE OF ULCERS  *URINARY PROBLEMS  *BOWEL PROBLEMS  UNUSUAL RASH Items with * indicate a potential emergency and should be followed up as soon as possible.  Feel free to call the clinic you have any questions or concerns. The clinic phone number is [phone].

## 2012-04-28 ENCOUNTER — Encounter: Payer: Self-pay | Admitting: Internal Medicine

## 2012-04-28 ENCOUNTER — Ambulatory Visit (INDEPENDENT_AMBULATORY_CARE_PROVIDER_SITE_OTHER): Payer: Medicare Other | Admitting: Internal Medicine

## 2012-04-28 VITALS — BP 148/86 | HR 100 | Ht 69.5 in | Wt 243.4 lb

## 2012-04-28 DIAGNOSIS — K644 Residual hemorrhoidal skin tags: Secondary | ICD-10-CM

## 2012-04-28 DIAGNOSIS — K648 Other hemorrhoids: Secondary | ICD-10-CM

## 2012-04-28 MED ORDER — HYDROCORTISONE 2.5 % RE CREA: TOPICAL_CREAM | Freq: Two times a day (BID) | RECTAL | Status: DC | PRN

## 2012-04-28 NOTE — Progress Notes (Signed)
  Subjective:    Patient ID: [name], male    DOB: 1936-07-02, 76 y.o.   MRN: [id number]  HPI This elderly African American man with a history of multiple myeloma is seen in followup for hemorrhoids. He is actively being treated for his multiple myeloma. He was in our office in August and briefly admitted to the hospital in September with bleeding internal and external hemorrhoids. Outside of 2 episodes resulting in the office visit in the hospitalization he has not had further bleeding. He did take a course of hydrocortisone suppositories at one point. ProctoFoam was prescribed after the hospitalization but was not covered by his insurance. He has been using some preparation H. as needed. He takes MiraLax to try to control his stools using that as needed what most days. Medications, allergies, past medical history, past surgical history, family history and social history are reviewed and updated in the EMR.  Review of Systems As above.    Objective:   Physical Exam Is obese and in no acute distress, he uses a cane    Assessment & Plan:   1. Internal and external bleeding hemorrhoids    1. I have prescribed 2.5% hydrocortisone cream to use as needed 2. Written information regarding hemorrhoids given 3. Sitz baths advised as needed 4. MiraLax as needed 5. Followup with GI as needed, should he need some sort of intervention, surgical procedure would be indicated but in a man with his comorbidities and problems we should try to avoid that if we can. 6. I have explained that he may see periodic bleeding from time to time as long as it is fresh and red and self-limited does not necessarily need medical intervention or evaluation   CC: Redmond Baseman, MD

## 2012-04-28 NOTE — Patient Instructions (Addendum)
Today you have been given a hemorrhoid handout to read and follow.  Follow up with [place] as needed.  Thank you for choosing me and [facility].  Iva Boop, M.D., [facility]

## 2012-05-01 ENCOUNTER — Other Ambulatory Visit: Payer: Self-pay | Admitting: Certified Registered Nurse Anesthetist

## 2012-05-04 ENCOUNTER — Other Ambulatory Visit (HOSPITAL_BASED_OUTPATIENT_CLINIC_OR_DEPARTMENT_OTHER): Payer: Medicare Other | Admitting: Lab

## 2012-05-04 ENCOUNTER — Ambulatory Visit (HOSPITAL_BASED_OUTPATIENT_CLINIC_OR_DEPARTMENT_OTHER): Payer: Medicare Other

## 2012-05-04 VITALS — BP 144/80 | HR 86 | Temp 97.2°F | Resp 20

## 2012-05-04 DIAGNOSIS — C9000 Multiple myeloma not having achieved remission: Secondary | ICD-10-CM

## 2012-05-04 DIAGNOSIS — Z5112 Encounter for antineoplastic immunotherapy: Secondary | ICD-10-CM

## 2012-05-04 LAB — CBC WITH DIFFERENTIAL/PLATELET
Basophils Absolute: 0 10*3/uL (ref 0.0–0.1)
Eosinophils Absolute: 0.1 10*3/uL (ref 0.0–0.5)
LYMPH%: 27.3 % (ref 14.0–49.0)
MCV: 87.6 fL (ref 79.3–98.0)
MONO%: 15.7 % — ABNORMAL HIGH (ref 0.0–14.0)
NEUT#: 2.8 10*3/uL (ref 1.5–6.5)
Platelets: 151 10*3/uL (ref 140–400)
RBC: 3.99 10*6/uL — ABNORMAL LOW (ref 4.20–5.82)

## 2012-05-04 MED ORDER — BORTEZOMIB CHEMO SQ INJECTION 3.5 MG (2.5MG/ML)
1.3000 mg/m2 | Freq: Once | INTRAMUSCULAR | Status: AC
  Administered 2012-05-04: 3 mg via SUBCUTANEOUS
  Filled 2012-05-04: qty 3

## 2012-05-04 MED ORDER — DEXAMETHASONE 4 MG PO TABS
40.0000 mg | ORAL_TABLET | Freq: Once | ORAL | Status: AC
  Administered 2012-05-04: 40 mg via ORAL

## 2012-05-04 MED ORDER — ONDANSETRON HCL 8 MG PO TABS
8.0000 mg | ORAL_TABLET | Freq: Once | ORAL | Status: AC
  Administered 2012-05-04: 8 mg via ORAL

## 2012-05-04 NOTE — Patient Instructions (Addendum)
[facility] Discharge Instructions for Patients Receiving Chemotherapy  Today you received the following chemotherapy agents: Velcade  To help prevent nausea and vomiting after your treatment, we encourage you to take your nausea medication as directed by your MD.  If you develop nausea and vomiting that is not controlled by your nausea medication, call the clinic. If it is after clinic hours your family physician or the after hours number for the clinic or go to the Emergency Department.   BELOW ARE SYMPTOMS THAT SHOULD BE REPORTED IMMEDIATELY:  *FEVER GREATER THAN 100.5 F  *CHILLS WITH OR WITHOUT FEVER  NAUSEA AND VOMITING THAT IS NOT CONTROLLED WITH YOUR NAUSEA MEDICATION  *UNUSUAL SHORTNESS OF BREATH  *UNUSUAL BRUISING OR BLEEDING  TENDERNESS IN MOUTH AND THROAT WITH OR WITHOUT PRESENCE OF ULCERS  *URINARY PROBLEMS  *BOWEL PROBLEMS  UNUSUAL RASH Items with * indicate a potential emergency and should be followed up as soon as possible.   Feel free to call the clinic you have any questions or concerns. The clinic phone number is [phone].    

## 2012-05-11 ENCOUNTER — Other Ambulatory Visit: Payer: Self-pay | Admitting: *Deleted

## 2012-05-11 ENCOUNTER — Other Ambulatory Visit (HOSPITAL_BASED_OUTPATIENT_CLINIC_OR_DEPARTMENT_OTHER): Payer: Medicare Other | Admitting: Lab

## 2012-05-11 ENCOUNTER — Telehealth: Payer: Self-pay | Admitting: Oncology

## 2012-05-11 ENCOUNTER — Ambulatory Visit (HOSPITAL_BASED_OUTPATIENT_CLINIC_OR_DEPARTMENT_OTHER): Payer: Medicare Other | Admitting: Nurse Practitioner

## 2012-05-11 ENCOUNTER — Ambulatory Visit (HOSPITAL_BASED_OUTPATIENT_CLINIC_OR_DEPARTMENT_OTHER): Payer: Medicare Other

## 2012-05-11 VITALS — BP 107/68 | HR 78 | Temp 97.1°F | Resp 20 | Ht 69.5 in | Wt 245.7 lb

## 2012-05-11 DIAGNOSIS — C9000 Multiple myeloma not having achieved remission: Secondary | ICD-10-CM

## 2012-05-11 DIAGNOSIS — D630 Anemia in neoplastic disease: Secondary | ICD-10-CM

## 2012-05-11 DIAGNOSIS — Z86718 Personal history of other venous thrombosis and embolism: Secondary | ICD-10-CM

## 2012-05-11 DIAGNOSIS — R718 Other abnormality of red blood cells: Secondary | ICD-10-CM

## 2012-05-11 DIAGNOSIS — Z5112 Encounter for antineoplastic immunotherapy: Secondary | ICD-10-CM

## 2012-05-11 LAB — CBC WITH DIFFERENTIAL/PLATELET
BASO%: 0.4 % (ref 0.0–2.0)
EOS%: 1.3 % (ref 0.0–7.0)
LYMPH%: 28.7 % (ref 14.0–49.0)
MCH: 30.1 pg (ref 27.2–33.4)
MCHC: 34.2 g/dL (ref 32.0–36.0)
MCV: 88.1 fL (ref 79.3–98.0)
MONO%: 16 % — ABNORMAL HIGH (ref 0.0–14.0)
Platelets: 128 10*3/uL — ABNORMAL LOW (ref 140–400)
RBC: 3.95 10*6/uL — ABNORMAL LOW (ref 4.20–5.82)
WBC: 5.1 10*3/uL (ref 4.0–10.3)

## 2012-05-11 LAB — BASIC METABOLIC PANEL (CC13)
Calcium: 8.9 mg/dL (ref 8.4–10.4)
Sodium: 136 mEq/L (ref 136–145)

## 2012-05-11 MED ORDER — DEXAMETHASONE 4 MG PO TABS
40.0000 mg | ORAL_TABLET | Freq: Once | ORAL | Status: AC
  Administered 2012-05-11: 40 mg via ORAL

## 2012-05-11 MED ORDER — ONDANSETRON HCL 8 MG PO TABS
8.0000 mg | ORAL_TABLET | Freq: Once | ORAL | Status: AC
  Administered 2012-05-11: 8 mg via ORAL

## 2012-05-11 MED ORDER — CYCLOPHOSPHAMIDE 50 MG PO TABS: 600.0000 mg | ORAL_TABLET | ORAL | Status: DC

## 2012-05-11 MED ORDER — BORTEZOMIB CHEMO SQ INJECTION 3.5 MG (2.5MG/ML)
1.3000 mg/m2 | Freq: Once | INTRAMUSCULAR | Status: AC
  Administered 2012-05-11: 3 mg via SUBCUTANEOUS
  Filled 2012-05-11: qty 3

## 2012-05-11 NOTE — Patient Instructions (Signed)
[facility] Discharge Instructions for Patients Receiving Chemotherapy  Today you received the following chemotherapy agents Velcade  To help prevent nausea and vomiting after your treatment, we encourage you to take your nausea medication as prescribed.   If you develop nausea and vomiting that is not controlled by your nausea medication, call the clinic. If it is after clinic hours your family physician or the after hours number for the clinic or go to the Emergency Department.   BELOW ARE SYMPTOMS THAT SHOULD BE REPORTED IMMEDIATELY:  *FEVER GREATER THAN 100.5 F  *CHILLS WITH OR WITHOUT FEVER  NAUSEA AND VOMITING THAT IS NOT CONTROLLED WITH YOUR NAUSEA MEDICATION  *UNUSUAL SHORTNESS OF BREATH  *UNUSUAL BRUISING OR BLEEDING  TENDERNESS IN MOUTH AND THROAT WITH OR WITHOUT PRESENCE OF ULCERS  *URINARY PROBLEMS  *BOWEL PROBLEMS  UNUSUAL RASH Items with * indicate a potential emergency and should be followed up as soon as possible.  Feel free to call the clinic you have any questions or concerns. The clinic phone number is [phone].   I have been informed and understand all the instructions given to me. I know to contact the clinic, my physician, or go to the Emergency Department if any problems should occur. I do not have any questions at this time, but understand that I may call the clinic during office hours   should I have any questions or need assistance in obtaining follow up care.    

## 2012-05-11 NOTE — Progress Notes (Signed)
OFFICE PROGRESS NOTE  Interval history:  [name] returns as scheduled. He continues Cytoxan/Velcade/Decadron. Most recent serum M spike on 04/13/2012 was stable at 2.98.  [name] reports he is tolerating the chemotherapy well. He denies nausea/vomiting. No mouth sores. No diarrhea. He denies any numbness or tingling in his hands or feet. He reports recent fracturing of a tooth with associated pain. He would like to see a dentist.   Objective: Blood pressure 107/68, pulse 78, temperature 97.1 F (36.2 C), temperature source Oral, resp. rate 20, height 5' 9.5" (1.765 m), weight 245 lb 11.2 oz (111.449 kg).  Poor dentition. Fractured tooth right upper anterior gumline. Lungs clear. Regular cardiac rhythm. Abdomen soft and nontender. No organomegaly. Extremities without edema.  Lab Results: Lab Results  Component Value Date   WBC 5.1 05/11/2012   HGB 11.9* 05/11/2012   HCT 34.8* 05/11/2012   MCV 88.1 05/11/2012   PLT 128* 05/11/2012    Chemistry:    Chemistry      Component Value Date/Time   NA 136 05/11/2012 1145   NA 134* 03/21/2012 0430   K 4.4 05/11/2012 1145   K 3.6 03/21/2012 0430   CL 107 05/11/2012 1145   CL 102 03/21/2012 0430   CO2 24 05/11/2012 1145   CO2 27 03/21/2012 0430   BUN 17.0 05/11/2012 1145   BUN 5* 03/21/2012 0430   CREATININE 0.8 05/11/2012 1145   CREATININE 0.71 03/21/2012 0430      Component Value Date/Time   CALCIUM 8.9 05/11/2012 1145   CALCIUM 8.3* 03/21/2012 0430   ALKPHOS 62 04/13/2012 1107   ALKPHOS 66 03/20/2012 0135   AST 16 04/13/2012 1107   AST 14 03/20/2012 0135   ALT 11 04/13/2012 1107   ALT 15 03/20/2012 0135   BILITOT 0.60 04/13/2012 1107   BILITOT 0.2* 03/20/2012 0135       Studies/Results: No results found.  Medications: I have reviewed the patient's current medications.  Assessment/Plan:  1. Multiple myeloma, status post treatment with melphalan/prednisone/thalidomide beginning in November 2009. The thalidomide was increased to 200  mg daily beginning 09/11/2008. The serum M spike was slightly improved on 03/18/2009 and slightly increased on 05/09/2009. He has been maintained off of specific therapy for multiple myeloma since September 2010. The serum M spike was increased on 10/20/2011 at 4.6. He began Revlimid on 11/11/2011. He takes weekly dexamethasone. He began cycle 2 of Revlimid on 12/09/2011. The serum M spike was improved on 12/31/2011. The serum M spike was slightly higher on 01/28/2012. He began cycle 4 on 02/03/2012. He developed recurrent hypercalcemia. Treatment was changed to Cytoxan/Velcade/Decadron beginning 03/02/2012. The serum M spike was lower on 02/25/2012. The serum M spike was stable on 04/13/2012. 2. Chronic "dizziness." 3. Chest x-ray 11/01/2008 with a patchy opacity at the right midlung suspicious for pneumonia. He was treated with Avelox. 4. Low back pain with a radicular component. An MRI on 10/07/2008 showed no involvement of the lumbosacral spine with myeloma. 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 5. Bilateral neck pain, status post a CT 05/17/2008 with findings of spinal stenosis and osteoarthritis. 6. 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 Red cell microcytosis, likely related to hemoglobin C trait. A ferritin level returned elevated and a stool Hemoccult was negative 01/23/2010. He underwent a colonoscopy in 2009. 8. Elevated beta-2 microglobulin level. 9. History of hypertension. 10. History of a herniated  disk at L4-L5 on MRI an 01/11/2002. 11. Hyperlipidemia. 12. Gastroesophageal reflux disease. 13. History of atypical angina. 14. History of rectal bleeding-large external hemorrhoids noted 02/25/2012. The stool was Hemoccult positive. He has a history of colon polyps. He is followed by [facility]. He was diagnosed with hemorrhoids  in September of 2013 while hospitalized 15. Superficial venous thrombosis of the greater saphenous vein on a Doppler 09/27/2008. Negative for deep vein thrombosis. Symptoms improved with aspirin. 16. Neck pain with numbness/tingling in the arms, hands, and low back with proximal right leg weakness. An MRI of the cervical spine 05/30/2009 showed multilevel spondylosis with mild cord edema at C4-C5 and C5-C6. He was noted to have an enhancing lesion of the cord at T3-T4 of unclear etiology. In the lumbar spine there was no change in the spondylosis at L3-L4 and L4-L5. There was no involvement of the cervical or lumbar spine with myeloma. He is status post C4-C5, C5-C6, and C6-C7 anterior cervical diskectomy with fusion by Dr. Jordan Likes 08/01/2009. 17. History of mild thrombocytopenia. 18. Intermittent indeterminate-age deep vein thrombosis of the left lower extremity on a venous Doppler 08/15/2009. There were also findings consistent with superficial thrombosis involving the right lower extremity 08/15/2009. Coumadin was discontinued in October 2011. 19. Type 2 diabetes. 20. History of hematuria, followed at [facility]. Urinalysis was negative for blood on 07/23/2011. 21. Radicular back pain with MRI of the lumbar spine on 10/19/2011 showing nerve root impingement at L2-L3, L3-L4 and L4-L5 with the most severe at the right L3 nerve roots due to a large disc fragment. Associated right leg weakness. He underwent right L2-3 decompressive laminotomy with right L2 and L3 decompressive foraminotomy; right L2-3 microdiscectomy on 11/01/2011. 22. Constipation He continues a laxative regimen. 23. Hospitalization 11/08/2011 through 11/10/2011 with orthostatic hypotension/dizziness. He improved with intravenous hydration. He had mild hypotension when here on 11/18/2011. We recommended discontinuing Norvasc and Proscar. 24. Hypercalcemia status post pamidronate 11/03/2011. Recurrent hypercalcemia 02/16/2012 status post  Zometa. The calcium has remained in normal range. 25. Fractured tooth with associated pain. We are making a referral to Dr. Kristin Bruins.  Disposition-[name] appears stable. We will followup on the serum protein electrophoresis from today. Plan to continue Cytoxan/Velcade/Decadron. He will return for a followup visit on 06/09/2012. He will contact the office in the interim with any problems.  Plan reviewed with Dr. Truett Perna.  Lonna Cobb ANP/GNP-BC

## 2012-05-11 NOTE — Telephone Encounter (Signed)
lvm for Doris Dr. Jarold Motto sec. To make an appt.Marland Kitchen..Marland Kitchenemailed Marcelino Duster to add infusion for this pt..the patient aware....gv and printed appt schedule for pt.

## 2012-05-12 ENCOUNTER — Telehealth: Payer: Self-pay | Admitting: Oncology

## 2012-05-12 NOTE — Telephone Encounter (Signed)
s.w. pt and advised on Nov appt 11.14.13

## 2012-05-15 LAB — PROTEIN ELECTROPHORESIS, SERUM
Alpha-1-Globulin: 3.2 % (ref 2.9–4.9)
Alpha-2-Globulin: 8.7 % (ref 7.1–11.8)
Total Protein, Serum Electrophoresis: 7.8 g/dL (ref 6.0–8.3)

## 2012-05-15 LAB — IGG: IgG (Immunoglobin G), Serum: 2350 mg/dL — ABNORMAL HIGH (ref 650–1600)

## 2012-05-16 ENCOUNTER — Telehealth: Payer: Self-pay | Admitting: Oncology

## 2012-05-16 NOTE — Telephone Encounter (Signed)
S.w. With Doris from Dr. Lurene Shadow office...she stated that the pt is on a waiting list and when he comes up they will call the pt if baised on the pt's problem are within Dr. Lurene Shadow scope of practice.

## 2012-05-17 ENCOUNTER — Telehealth: Payer: Self-pay | Admitting: *Deleted

## 2012-05-17 NOTE — Telephone Encounter (Signed)
Message copied by Wandalee Ferdinand on Wed May 17, 2012  2:22 PM ------      Message from: Thornton Papas B      Created: Mon May 15, 2012  8:01 PM       Please call patient IgG and m-spike are better, f/u as scheduled,continue current therapy

## 2012-05-17 NOTE — Telephone Encounter (Signed)
Notified wife of SPEP and IgG results improved. Follow up as scheduled.

## 2012-05-18 ENCOUNTER — Other Ambulatory Visit: Payer: Self-pay | Admitting: Oncology

## 2012-05-19 ENCOUNTER — Encounter (HOSPITAL_COMMUNITY): Payer: Self-pay | Admitting: Dentistry

## 2012-05-19 ENCOUNTER — Other Ambulatory Visit: Payer: Self-pay | Admitting: Nurse Practitioner

## 2012-05-19 ENCOUNTER — Ambulatory Visit (HOSPITAL_COMMUNITY): Payer: Self-pay | Admitting: Dentistry

## 2012-05-19 VITALS — BP 142/82 | HR 81 | Temp 98.1°F

## 2012-05-19 DIAGNOSIS — C9000 Multiple myeloma not having achieved remission: Secondary | ICD-10-CM

## 2012-05-19 DIAGNOSIS — K036 Deposits [accretions] on teeth: Secondary | ICD-10-CM

## 2012-05-19 DIAGNOSIS — K029 Dental caries, unspecified: Secondary | ICD-10-CM

## 2012-05-19 DIAGNOSIS — D696 Thrombocytopenia, unspecified: Secondary | ICD-10-CM

## 2012-05-19 DIAGNOSIS — K083 Retained dental root: Secondary | ICD-10-CM

## 2012-05-19 DIAGNOSIS — Z972 Presence of dental prosthetic device (complete) (partial): Secondary | ICD-10-CM

## 2012-05-19 DIAGNOSIS — K08109 Complete loss of teeth, unspecified cause, unspecified class: Secondary | ICD-10-CM

## 2012-05-19 DIAGNOSIS — Z9229 Personal history of other drug therapy: Secondary | ICD-10-CM

## 2012-05-19 DIAGNOSIS — Z79899 Other long term (current) drug therapy: Secondary | ICD-10-CM

## 2012-05-19 DIAGNOSIS — M264 Malocclusion, unspecified: Secondary | ICD-10-CM

## 2012-05-19 DIAGNOSIS — K0530 Chronic periodontitis, unspecified: Secondary | ICD-10-CM

## 2012-05-19 NOTE — Progress Notes (Signed)
DENTAL CONSULTATION  Date of Consultation:  05/19/2012 Patient Name:   [name] Date of Birth:   24-Dec-1935 Medical Record Number: [id number]  VITALS: BP 142/82  Pulse 81  Temp 98.1 F (36.7 C) (Oral)   CHIEF COMPLAINT: Patient referred for evaluation of broken tooth.  HPI: [name] is a 76 -year-old male referred by Dr. Arnoldo Lenis for dental consultation. Patient has a history of multiple myeloma undergoing active chemotherapy. Patient also has a history of one dose of IV Aredia and a history of one dose of IV Zometa.  Dental consultation requested to evaluate poor dentition and provide treatment is indicated in coordination with active chemotherapy schedule, thrombocytopenia, and history of previous IV bisphosphonate therapy.  The patient currently is complaining of a broken upper right maxillary tooth. The patient points to tooth #6 is the offending tooth.This tooth does not hurt by patient report but is bothering his upper lip.  Patient indicates that the upper right tooth of the fractured off for approximately one year. He last saw a dentist approximately 7 years ago to have an exam, cleaning, and fabrication of upper and lower partial dentures.  Patient has not worn the partial dentures for the past 3 years.  Patient cannot remember the name of the dentist that fabricated the partial dentures.  PMH: Past Medical History  Diagnosis Date  . GERD (gastroesophageal reflux disease)   . Hypertension   . BPH (benign prostatic hyperplasia)   . Lumbar herniated disc L4-5  . Hyperlipidemia   . Atypical angina     june 2004  . Sleep apnea   . Multiple myeloma(203.0) 02/19/2008  . Arthritis   . Myeloma   . Diverticulosis   . Peptic ulcer disease   . Tubular adenoma 02/16/2008    Dr. Stan Head    PSH: Past Surgical History  Procedure Date  . Neck surgery   . Lumbar laminectomy/decompression microdiscectomy 11/01/2011    Procedure: LUMBAR LAMINECTOMY/DECOMPRESSION  MICRODISCECTOMY 2 LEVELS;  Surgeon: Temple Pacini, MD;  Location: [facility];  Service: Neurosurgery;  Laterality: Right;  Lumbar Laminectomy/Microdiscectomy Decompression Lumbar Three-Four, Lumbar Four-Five   . Colonoscopy     ALLERGIES: Allergies  Allergen Reactions  . Penicillins Hives and Rash    MEDICATIONS: Current Outpatient Prescriptions  Medication Sig Dispense Refill  . acyclovir (ZOVIRAX) 400 MG tablet Take 1 tablet (400 mg total) by mouth 2 (two) times daily.  60 tablet  3  . aspirin 325 MG EC tablet Take 325 mg by mouth daily.      Marland Kitchen atorvastatin (LIPITOR) 10 MG tablet Take 1 tablet by mouth Daily.      . cyclophosphamide (CYTOXAN) 50 MG tablet Take 12 tablets (600 mg total) by mouth once a week. Give on an empty stomach 1 hour before or 2 hours after meals.  48 tablet  0  . dexamethasone (DECADRON) 4 MG tablet Take 5 tablets (20 mg total) by mouth as directed. Take 20 mg by mouth on Thursday not getting dose at [facility]  5 tablet  3  . hydrocortisone (ANUSOL-HC) 2.5 % rectal cream Place rectally 2 (two) times daily as needed for hemorrhoids.  30 g  0  . KLOR-CON M20 20 MEQ tablet TAKE 1 TABLET EVERY DAY  30 tablet  3  . omeprazole (PRILOSEC) 20 MG capsule Take 1 tablet by mouth daily.      . Polyethylene Glycol 3350 (MIRALAX PO) Take 17 g by mouth every morning.      Marland Kitchen  Potassium Chloride (K-TABS PO) Take 20 mEq by mouth daily.      . prochlorperazine (COMPAZINE) 10 MG tablet Take 1 tablet (10 mg total) by mouth every 6 (six) hours as needed.  30 tablet  1  . psyllium (METAMUCIL) 58.6 % powder Take 1 packet by mouth at bedtime.      . Tamsulosin HCl (FLOMAX) 0.4 MG CAPS Take 0.4 mg by mouth 2 (two) times daily.       Marland Kitchen zolpidem (AMBIEN) 10 MG tablet Take 5 mg by mouth at bedtime as needed.        LABS: Lab Results  Component Value Date   WBC 5.1 05/11/2012   HGB 11.9* 05/11/2012   HCT 34.8* 05/11/2012   MCV 88.1 05/11/2012   PLT 128* 05/11/2012   BMET      Component Value Date/Time   NA 136 05/11/2012 1145   NA 134* 03/21/2012 0430   K 4.4 05/11/2012 1145   K 3.6 03/21/2012 0430   CL 107 05/11/2012 1145   CL 102 03/21/2012 0430   CO2 24 05/11/2012 1145   CO2 27 03/21/2012 0430   GLUCOSE 110* 05/11/2012 1145   GLUCOSE 92 03/21/2012 0430   BUN 17.0 05/11/2012 1145   BUN 5* 03/21/2012 0430   CREATININE 0.8 05/11/2012 1145   CREATININE 0.71 03/21/2012 0430   CALCIUM 8.9 05/11/2012 1145   CALCIUM 8.3* 03/21/2012 0430   GFRNONAA >90 03/21/2012 0430   GFRAA >90 03/21/2012 0430     Lab Results  Component Value Date   INR 1.06 03/19/2012   INR 1.07 07/16/2010   INR 1.57* 12/13/2009   No results found for this basename: PTT    SOCIAL HISTORY: History   Social History  . Marital Status: Married    Spouse Name: N/A    Number of Children: 4  . Years of Education: N/A   Occupational History  . retired-disabled-Construction    Social History Main Topics  . Smoking status: Former Smoker -- 2.0 packs/day for 20 years    Types: Cigarettes    Quit date: 07/12/1990  . Smokeless tobacco: Never Used  . Alcohol Use: No  . Drug Use: No  . Sexually Active: Not on file   Other Topics Concern  . Not on file   Social History Narrative   Pt was adopted.    FAMILY HISTORY: Family History  Problem Relation Age of Onset  . Adopted: Yes     REVIEW OF SYSTEMS: Reviewed with patient and included in dental chart.  DENTAL HISTORY: CHIEF COMPLAINT: Patient referred for evaluation of broken tooth.  HPI: [name] is a 76 -year-old male referred by Dr. Arnoldo Lenis for dental consultation. Patient has a history of multiple myeloma undergoing active chemotherapy. Patient also has a history of one dose of IV Aredia and a history of one dose of IV Zometa.  Dental consultation requested to evaluate poor dentition and provide treatment is indicated in coordination with active chemotherapy schedule, thrombocytopenia, and history of previous IV  bisphosphonate therapy.  The patient currently is complaining of a broken upper right maxillary tooth. The patient points to tooth #6 is the offending tooth.This tooth does not hurt by patient report but is bothering his upper lip.  Patient indicates that the upper right tooth of the fractured off for approximately one year. He last saw a dentist approximately 7 years ago to have an exam, cleaning, and fabrication of upper and lower partial dentures.  Patient has not worn  the partial dentures for the past 3 years.  Patient cannot remember the name of the dentist that fabricated the partial dentures.  DENTAL EXAMINATION:  GENERAL: Patient is a well-developed, well-nourished male in no acute distress. HEAD AND NECK:There is no palpable submandibular lymphadenopathy. The patient denies acute TMJ symptoms.  INTRAORAL EXAM:Patient has normal saliva. There is no evidence of abscess formation within the mouth at this time.  DENTITION:The patient is missing tooth numbers 1, 3, 4, 5, 10, 11, 12, 13, 14, 15, 16, 17, 19, 20, 31, and 32.  Tooth #6 is present as retained root segment.  PERIODONTAL:Patient with chronic, advanced periodontal disease with plaque and calculus accumulations, generalized interval recession, generalized tooth mobility, and moderate to severe bone loss.  DENTAL CARIES/SUBOPTIMAL RESTORATIONS:There are multiple dental caries noted as per dental charting form. ENDODONTIC:Patient currently denies acute pulpitis symptoms. There is evidence of periapical pathology associated with tooth numbers 6 and possibly tooth #9 as well. CROWN AND BRIDGE:There are no crown restorations. PROSTHODONTIC:Patient with a history of upper and lower cast partial dentures that he has not worn for the past 3 years. These were fabricated approximately 7 years ago.  OCCLUSION:Patient with a poor occlusal scheme secondary to multiple missing teeth, retained root segment #6, and lack of replacement of missing teeth  with clinically acceptable dental prostheses.  RADIOGRAPHIC INTERPRETATION: An orthopantogram was obtained today and supplemented with 9 periapical radiographs. There are multiple missing teeth. There is retained root segment #6. There is periapical pathology and radiolucency associated with tooth numbers 6 and possibly #9. There multiple dental caries noted. There is moderate to severe bone loss. There is supra-eruption and drifting of the unopposed teeth into the edentulous areas.   ASSESSMENTS: 1. Fractured tooth numbers 6 at the gum line with history of upper lip soreness by patient report. 2. Periapical pathology associated with tooth numbers 6 and possibly #9 as well. 3. No current acute pulpitis symptoms 4. Chronic periodontitis with bone loss 5. Plaque and calculus accumulations 6. Generalized gingival recession 7. Tooth mobility 8. Dental caries 9. Ill-fitting maxillary and mandibular partial denture that he no longer wears 10.  Thrombocytopenia with the risk for bleeding with invasive dental procedures 11. 2 doses of IV bisphosphonate therapy with the risk for osteonecrosis of the jaw with invasive dental procedures. 12. History of dental neglect  PLAN/RECOMMENDATIONS: 1. I discussed the risks, benefits, and complications of various treatment options with the patient in relationship to the medical and dental conditions, current chemotherapy, and previous IV bisphosphonate therapy.  We discussed potential competitions for bleeding due to the thrombocytopenia as well as possible osteonecrosis of the jaw related to previous IV bisphosphonate therapy with invasive dental procedures. We discussed various treatment options to include no treatment,  selective extractions tooth numbers 6 only, multiple extractions with alveoloplasty, pre-prosthetic surgery as indicated, periodontal therapy, dental restorations, root canal therapy, crown and bridge therapy, implant therapy, and replacement of  missing teeth as indicated. The patient currently wishes to proceed with extraction of tooth #6 at this time only. We will need to coordinate this extraction with Dr. Truett Perna to confirm the patient's platelet count and the ability to proceed with dental extraction at this time.  Extensive time was spent on discussing the potential increased risks for osteonecrosis of the jaw with future dental extractions once additional doses of the IV bisphosphonate therapy are given.  Patient will think about his options and consider additional extractions after he has healed from this initial extraction.  2.  Discussion of findings with medical team and coordination of future medical and dental care.   Charlynne Pander, DDS

## 2012-05-19 NOTE — Patient Instructions (Signed)
Return to dental medicine clinic for extraction of tooth numbers 6 after coordination of this treatment with Dr. Arnoldo Lenis.

## 2012-05-22 ENCOUNTER — Other Ambulatory Visit: Payer: Self-pay | Admitting: Nurse Practitioner

## 2012-05-23 ENCOUNTER — Encounter: Payer: Self-pay | Admitting: *Deleted

## 2012-05-23 NOTE — Progress Notes (Signed)
RECEIVED A FAX FROM [employer] [facility]CERNING A CONFIRMATION OF PRESCRIPTION SHIPMENT FOR CYCLOPHOSPHAMIDE ON 05/22/12.

## 2012-05-24 ENCOUNTER — Encounter (HOSPITAL_COMMUNITY): Payer: Self-pay | Admitting: Dentistry

## 2012-05-24 ENCOUNTER — Ambulatory Visit (HOSPITAL_COMMUNITY): Payer: Self-pay | Admitting: Dentistry

## 2012-05-24 ENCOUNTER — Other Ambulatory Visit (HOSPITAL_BASED_OUTPATIENT_CLINIC_OR_DEPARTMENT_OTHER): Payer: Medicare Other | Admitting: Lab

## 2012-05-24 VITALS — BP 147/86 | HR 87 | Temp 98.3°F

## 2012-05-24 DIAGNOSIS — C888 Other malignant immunoproliferative diseases: Secondary | ICD-10-CM

## 2012-05-24 DIAGNOSIS — C9000 Multiple myeloma not having achieved remission: Secondary | ICD-10-CM

## 2012-05-24 DIAGNOSIS — K083 Retained dental root: Secondary | ICD-10-CM

## 2012-05-24 DIAGNOSIS — K045 Chronic apical periodontitis: Secondary | ICD-10-CM

## 2012-05-24 DIAGNOSIS — Z9229 Personal history of other drug therapy: Secondary | ICD-10-CM

## 2012-05-24 DIAGNOSIS — K044 Acute apical periodontitis of pulpal origin: Secondary | ICD-10-CM

## 2012-05-24 LAB — CBC WITH DIFFERENTIAL/PLATELET
Basophils Absolute: 0 10*3/uL (ref 0.0–0.1)
EOS%: 1.7 % (ref 0.0–7.0)
Eosinophils Absolute: 0.1 10*3/uL (ref 0.0–0.5)
HGB: 12.1 g/dL — ABNORMAL LOW (ref 13.0–17.1)
NEUT#: 3.4 10*3/uL (ref 1.5–6.5)
RDW: 18.8 % — ABNORMAL HIGH (ref 11.0–14.6)
WBC: 5.4 10*3/uL (ref 4.0–10.3)
lymph#: 1.1 10*3/uL (ref 0.9–3.3)

## 2012-05-24 NOTE — Progress Notes (Signed)
Patient:            [name] Date of Birth:  07-21-1935 MRN:                [id number]   DATE OF PROCEDURE:  05/24/2012               OPERATIVE REPORT   PREOPERATIVE DIAGNOSES: 1. Multiple Myeloma 2. Active chemotherapy 3. History of IV bisphosphonate 4. Retained root segment #6 5. Apical periodontitis  POSTOPERATIVE DIAGNOSES: 1. Multiple Myeloma 2. Active chemotherapy 3. History of IV bisphosphonate 4. Retained root segment #6 5. Apical periodontitis   OPERATIONS: 1. Extraction of tooth #6   SURGEON: Charlynne Pander, DDS  ASSISTANT: Zettie Pho, (dental assistant)  ANESTHESIA: Local anesthesia only   MEDICATIONS: 1. Local anesthesia with a total utilization of one carpule each containing 34 mg of lidocaine with 0.017 mg of epinephrine  SPECIMENS: There was one tooth discarded.  DRAINS: None  CULTURES: None  COMPLICATIONS: None   ESTIMATED BLOOD LOSS: Less than 5 mLs.  INTRAVENOUS FLUIDS: None  INDICATIONS: The patient was previously diagnosed with multiple myeloma with a history of active chemotherapy and 2 doses of IV bisphosphonate therapy. A dental consultation was then requested to evaluate history of retained root segment.   The patient was examined and treatment planned for extraction of tooth #6 with alveoloplasty as indicated.  This treatment plan was formulated to decrease the risks and complications associated with dental infection from affecting the patient's systemic health.    OPERATIVE FINDINGS: Patient was examined in the dental clinic.  Tooth #6 was identified for extraction. The patient was noted be affected by a retained root segment #6, chronic apical periodontitis, and dental caries affecting the retained root segment.   DESCRIPTION OF PROCEDURE: Patient arrived operatory 1 in the dental clinic. Patient was then placed in the sitting position in the dental chair.  Preoperative vitals were obtained.  An extensive discussion  was provided concerning the risk for osteonecrosis of the jaw and other complications related to the surgical procedure. Informed consent was obtained. The patient was then prepped and draped in the usual manner for dental medicine procedure. A timeout was performed. The patient was identified and procedures were verified. The oral cavity was then thoroughly examined with the findings noted above. The patient was then ready for dental medicine procedure as follows:  Local anesthesia was then administered with a total utilization of one carpule containing 34 mg of lidocaine with 0.017 mg of epinephrine.  The Maxillary right quadrant was first approached. Anesthesia was then delivered utilizing infiltration with lidocaine with epinephrine. A Woodson elevator was then used to remove the soft tissue from the hard tissue. Tooth numbers 6 was then removed with a 150 forceps without complications. The socket was then curetted and compressed appropriately. No sutures were placed at this time.  The surgical site was then irrigated with copious amounts of sterile saline. A series of 4 x 4 gauze were placed to aid hemostasis. The patient was then examined for complications, seeing none, the dental medicine procedures deemed to be complete. Postoperative instructions were provided in a written and verbal format.  Postoperative vital signs were obtained with a blood pressure reading 147/86 and pulse of 87.  After an appropriate amount of time, the patient was transferred to the care of his spouse with stable vital signs and a good condition. All counts were correct for the dental medicine procedure. Patient indicated that he does have pain  medication at home. Patient is to return to clinic in approximately one week for evaluation of healing.   Charlynne Pander, DDS.

## 2012-05-24 NOTE — Progress Notes (Signed)
PRE-OPERATIVE NOTE:  05/24/2012 [name] [id number]  VITALS: BP 140/82  Pulse 78  Temp 98.3 F (36.8 C) (Oral)  Lab Results  Component Value Date   WBC 5.4 05/24/2012   HGB 12.1* 05/24/2012   HCT 34.1* 05/24/2012   MCV 88.4 05/24/2012   PLT 159 05/24/2012   BMET    Component Value Date/Time   NA 136 05/11/2012 1145   NA 134* 03/21/2012 0430   K 4.4 05/11/2012 1145   K 3.6 03/21/2012 0430   CL 107 05/11/2012 1145   CL 102 03/21/2012 0430   CO2 24 05/11/2012 1145   CO2 27 03/21/2012 0430   GLUCOSE 110* 05/11/2012 1145   GLUCOSE 92 03/21/2012 0430   BUN 17.0 05/11/2012 1145   BUN 5* 03/21/2012 0430   CREATININE 0.8 05/11/2012 1145   CREATININE 0.71 03/21/2012 0430   CALCIUM 8.9 05/11/2012 1145   CALCIUM 8.3* 03/21/2012 0430   GFRNONAA >90 03/21/2012 0430   GFRAA >90 03/21/2012 0430    Lab Results  Component Value Date   INR 1.06 03/19/2012   INR 1.07 07/16/2010   INR 1.57* 12/13/2009   No results found for this basename: PTT     [name] presents for dental extraction of tooth #6 with alveoloplasty in the dental clinic. We had an extensive discussion of the risks, benefits, complications of the dental procedures to include possible osteonecrosis of the jaw related to previous IV bisphosphonate therapy. Informed consent was obtained related to extraction of teeth with previous IV bisphosphonate therapy as well as informed consent for the surgical procedures. CBC was obtained this morning and platelet levels were acceptable.   SUBJECTIVE: The patient denies any acute medical or dental changes and agrees to proceed with treatment as planned.  EXAM: No sign of acute dental changes.  ASSESSMENT: Patient is affected by retained root segment #, dental caries, apical periodontitis, history of previous IV bisphosphonate therapy with risk for osteonecrosis of the jaw.  PLAN: Patient agrees to proceed with treatment as planned as discussed and accepts the risks, benefits,  complications of the proposed treatment.   Charlynne Pander, DDS

## 2012-05-24 NOTE — Patient Instructions (Signed)

## 2012-05-25 ENCOUNTER — Other Ambulatory Visit: Payer: Medicare Other | Admitting: Lab

## 2012-05-25 ENCOUNTER — Ambulatory Visit (HOSPITAL_BASED_OUTPATIENT_CLINIC_OR_DEPARTMENT_OTHER): Payer: Medicare Other

## 2012-05-25 ENCOUNTER — Other Ambulatory Visit: Payer: Self-pay | Admitting: Oncology

## 2012-05-25 VITALS — BP 131/81 | HR 81 | Temp 96.9°F

## 2012-05-25 DIAGNOSIS — Z5112 Encounter for antineoplastic immunotherapy: Secondary | ICD-10-CM

## 2012-05-25 DIAGNOSIS — C9000 Multiple myeloma not having achieved remission: Secondary | ICD-10-CM

## 2012-05-25 MED ORDER — BORTEZOMIB CHEMO SQ INJECTION 3.5 MG (2.5MG/ML)
1.3000 mg/m2 | Freq: Once | INTRAMUSCULAR | Status: AC
  Administered 2012-05-25: 3 mg via SUBCUTANEOUS
  Filled 2012-05-25: qty 3

## 2012-05-25 MED ORDER — DEXAMETHASONE 4 MG PO TABS
40.0000 mg | ORAL_TABLET | Freq: Once | ORAL | Status: AC
  Administered 2012-05-25: 40 mg via ORAL

## 2012-05-25 MED ORDER — ONDANSETRON HCL 8 MG PO TABS
8.0000 mg | ORAL_TABLET | Freq: Once | ORAL | Status: AC
  Administered 2012-05-25: 8 mg via ORAL

## 2012-05-25 NOTE — Patient Instructions (Addendum)
[facility] Discharge Instructions for Patients Receiving Chemotherapy  Today you received the following chemotherapy agents velcade  To help prevent nausea and vomiting after your treatment, we encourage you to take your nausea medication as prescribed Begin taking it as directed and take it as often as prescribed for the next as needed hours.   If you develop nausea and vomiting that is not controlled by your nausea medication, call the clinic. If it is after clinic hours your family physician or the after hours number for the clinic or go to the Emergency Department.   BELOW ARE SYMPTOMS THAT SHOULD BE REPORTED IMMEDIATELY:  *FEVER GREATER THAN 100.5 F  *CHILLS WITH OR WITHOUT FEVER  NAUSEA AND VOMITING THAT IS NOT CONTROLLED WITH YOUR NAUSEA MEDICATION  *UNUSUAL SHORTNESS OF BREATH  *UNUSUAL BRUISING OR BLEEDING  TENDERNESS IN MOUTH AND THROAT WITH OR WITHOUT PRESENCE OF ULCERS  *URINARY PROBLEMS  *BOWEL PROBLEMS  UNUSUAL RASH Items with * indicate a potential emergency and should be followed up as soon as possible.  One of the nurses will contact you 24 hours after your treatment. Please let the nurse know about any problems that you may have experienced. Feel free to call the clinic you have any questions or concerns. The clinic phone number is [phone].   I have been informed and understand all the instructions given to me. I know to contact the clinic, my physician, or go to the Emergency Department if any problems should occur. I do not have any questions at this time, but understand that I may call the clinic during office hours   should I have any questions or need assistance in obtaining follow up care.    __________________________________________  _____________  __________ Signature of Patient or Authorized Representative            Date                   Time    __________________________________________ Nurse's Signature

## 2012-05-30 ENCOUNTER — Other Ambulatory Visit: Payer: Self-pay | Admitting: Certified Registered Nurse Anesthetist

## 2012-05-31 ENCOUNTER — Other Ambulatory Visit: Payer: Self-pay | Admitting: Oncology

## 2012-06-01 ENCOUNTER — Other Ambulatory Visit (HOSPITAL_COMMUNITY): Payer: Self-pay | Admitting: Dentistry

## 2012-06-01 ENCOUNTER — Other Ambulatory Visit (HOSPITAL_BASED_OUTPATIENT_CLINIC_OR_DEPARTMENT_OTHER): Payer: Medicare Other | Admitting: Lab

## 2012-06-01 ENCOUNTER — Ambulatory Visit (HOSPITAL_COMMUNITY): Payer: Self-pay | Admitting: Dentistry

## 2012-06-01 ENCOUNTER — Encounter (HOSPITAL_COMMUNITY): Payer: Self-pay | Admitting: Dentistry

## 2012-06-01 ENCOUNTER — Ambulatory Visit (HOSPITAL_BASED_OUTPATIENT_CLINIC_OR_DEPARTMENT_OTHER): Payer: Medicare Other

## 2012-06-01 VITALS — BP 107/66 | HR 87 | Temp 97.6°F | Resp 20

## 2012-06-01 VITALS — BP 137/71 | HR 82 | Temp 97.8°F

## 2012-06-01 DIAGNOSIS — Z1389 Encounter for screening for other disorder: Secondary | ICD-10-CM

## 2012-06-01 DIAGNOSIS — Z5112 Encounter for antineoplastic immunotherapy: Secondary | ICD-10-CM

## 2012-06-01 DIAGNOSIS — K0530 Chronic periodontitis, unspecified: Secondary | ICD-10-CM

## 2012-06-01 DIAGNOSIS — C9000 Multiple myeloma not having achieved remission: Secondary | ICD-10-CM

## 2012-06-01 DIAGNOSIS — K08109 Complete loss of teeth, unspecified cause, unspecified class: Secondary | ICD-10-CM

## 2012-06-01 DIAGNOSIS — K08199 Complete loss of teeth due to other specified cause, unspecified class: Secondary | ICD-10-CM

## 2012-06-01 LAB — CBC WITH DIFFERENTIAL/PLATELET
BASO%: 0.4 % (ref 0.0–2.0)
EOS%: 1.4 % (ref 0.0–7.0)
HGB: 12.7 g/dL — ABNORMAL LOW (ref 13.0–17.1)
MCH: 30.6 pg (ref 27.2–33.4)
MCHC: 34.8 g/dL (ref 32.0–36.0)
RBC: 4.16 10*6/uL — ABNORMAL LOW (ref 4.20–5.82)
RDW: 18.4 % — ABNORMAL HIGH (ref 11.0–14.6)
lymph#: 1.5 10*3/uL (ref 0.9–3.3)

## 2012-06-01 MED ORDER — BORTEZOMIB CHEMO SQ INJECTION 3.5 MG (2.5MG/ML)
1.3000 mg/m2 | Freq: Once | INTRAMUSCULAR | Status: AC
  Administered 2012-06-01: 3 mg via SUBCUTANEOUS
  Filled 2012-06-01: qty 3

## 2012-06-01 MED ORDER — DEXAMETHASONE 4 MG PO TABS
40.0000 mg | ORAL_TABLET | Freq: Once | ORAL | Status: AC
  Administered 2012-06-01: 40 mg via ORAL

## 2012-06-01 MED ORDER — ONDANSETRON HCL 8 MG PO TABS
8.0000 mg | ORAL_TABLET | Freq: Once | ORAL | Status: AC
  Administered 2012-06-01: 8 mg via ORAL

## 2012-06-01 NOTE — Progress Notes (Signed)
POST OPERATIVE NOTE:  06/01/2012 [name] [id number]  VITALS: BP 137/71  Pulse 82  Temp 97.8 F (36.6 C) (Oral)  Patient is status post extraction of tooth #6.  SUBJECTIVE: Patient without complaints. He denies any acute pain at this time.  EXAM: No sign of infection, heme, or ooze. Clot is still present. No exposed bone is noted.  ASSESSMENT: Post operative course is consistent with dental procedures performed.   PLAN: 1. Continue salt water rinses as needed. 2. Return to clinic as scheduled for reevaluation of healing in 3 weeks. 3. Call if acute problems arise   Charlynne Pander, DDS

## 2012-06-01 NOTE — Patient Instructions (Addendum)
Return to clinic in 3 weeks for reevaluation of healing and to follow patient to rule out osteonecrosis of the jaw.

## 2012-06-01 NOTE — Patient Instructions (Signed)
[facility] Discharge Instructions for Patients Receiving Chemotherapy  Today you received the following chemotherapy agents Velcade.  To help prevent nausea and vomiting after your treatment, we encourage you to take your nausea medication compazine. Begin taking it at anytime today upon discharge home and take it as often as prescribed for the next 72 hours.   If you develop nausea and vomiting that is not controlled by your nausea medication, call the clinic. If it is after clinic hours your family physician or the after hours number for the clinic or go to the Emergency Department.   BELOW ARE SYMPTOMS THAT SHOULD BE REPORTED IMMEDIATELY:  *FEVER GREATER THAN 100.5 F  *CHILLS WITH OR WITHOUT FEVER  NAUSEA AND VOMITING THAT IS NOT CONTROLLED WITH YOUR NAUSEA MEDICATION  *UNUSUAL SHORTNESS OF BREATH  *UNUSUAL BRUISING OR BLEEDING  TENDERNESS IN MOUTH AND THROAT WITH OR WITHOUT PRESENCE OF ULCERS  *URINARY PROBLEMS  *BOWEL PROBLEMS  UNUSUAL RASH Items with * indicate a potential emergency and should be followed up as soon as possible.  Please call to let a nurse know about any problems that you may have experienced. Feel free to call the clinic you have any questions or concerns. The clinic phone number is [phone].   I have been informed and understand all the instructions given to me. I know to contact the clinic, my physician, or go to the Emergency Department if any problems should occur. I do not have any questions at this time, but understand that I may call the clinic during office hours   should I have any questions or need assistance in obtaining follow up care.    __________________________________________  _____________  __________ Signature of Patient or Authorized Representative            Date                   Time    __________________________________________ Nurse's Signature

## 2012-06-09 ENCOUNTER — Ambulatory Visit (HOSPITAL_BASED_OUTPATIENT_CLINIC_OR_DEPARTMENT_OTHER): Payer: Medicare Other | Admitting: Oncology

## 2012-06-09 ENCOUNTER — Other Ambulatory Visit (HOSPITAL_BASED_OUTPATIENT_CLINIC_OR_DEPARTMENT_OTHER): Payer: Medicare Other | Admitting: Lab

## 2012-06-09 ENCOUNTER — Telehealth: Payer: Self-pay | Admitting: Oncology

## 2012-06-09 ENCOUNTER — Ambulatory Visit (HOSPITAL_BASED_OUTPATIENT_CLINIC_OR_DEPARTMENT_OTHER): Payer: Medicare Other

## 2012-06-09 VITALS — BP 131/85 | HR 86 | Temp 97.1°F | Resp 22 | Ht 69.5 in | Wt 252.0 lb

## 2012-06-09 DIAGNOSIS — C9000 Multiple myeloma not having achieved remission: Secondary | ICD-10-CM

## 2012-06-09 DIAGNOSIS — R42 Dizziness and giddiness: Secondary | ICD-10-CM

## 2012-06-09 DIAGNOSIS — Z5112 Encounter for antineoplastic immunotherapy: Secondary | ICD-10-CM

## 2012-06-09 LAB — COMPREHENSIVE METABOLIC PANEL (CC13)
ALT: 15 U/L (ref 0–55)
CO2: 25 mEq/L (ref 22–29)
Creatinine: 1 mg/dL (ref 0.7–1.3)
Total Bilirubin: 0.42 mg/dL (ref 0.20–1.20)

## 2012-06-09 LAB — CBC WITH DIFFERENTIAL/PLATELET
Basophils Absolute: 0 10*3/uL (ref 0.0–0.1)
EOS%: 1.5 % (ref 0.0–7.0)
HCT: 37.1 % — ABNORMAL LOW (ref 38.4–49.9)
HGB: 12.9 g/dL — ABNORMAL LOW (ref 13.0–17.1)
LYMPH%: 28.4 % (ref 14.0–49.0)
MCH: 30.6 pg (ref 27.2–33.4)
MCV: 88.1 fL (ref 79.3–98.0)
MONO%: 16 % — ABNORMAL HIGH (ref 0.0–14.0)
NEUT%: 53.4 % (ref 39.0–75.0)
Platelets: 122 10*3/uL — ABNORMAL LOW (ref 140–400)

## 2012-06-09 MED ORDER — BORTEZOMIB CHEMO SQ INJECTION 3.5 MG (2.5MG/ML)
1.3000 mg/m2 | Freq: Once | INTRAMUSCULAR | Status: AC
  Administered 2012-06-09: 3 mg via SUBCUTANEOUS
  Filled 2012-06-09: qty 3

## 2012-06-09 MED ORDER — DEXAMETHASONE 4 MG PO TABS
40.0000 mg | ORAL_TABLET | Freq: Once | ORAL | Status: AC
  Administered 2012-06-09: 40 mg via ORAL

## 2012-06-09 MED ORDER — ONDANSETRON HCL 8 MG PO TABS
8.0000 mg | ORAL_TABLET | Freq: Once | ORAL | Status: AC
  Administered 2012-06-09: 8 mg via ORAL

## 2012-06-09 NOTE — Progress Notes (Signed)
[facility]    OFFICE PROGRESS NOTE   INTERVAL HISTORY:   He returns as scheduled. He continues treatment with Cytoxan/Velcade/Decadron. [name] reports tolerating the chemotherapy well. No new complaint.  Objective:  Vital signs in last 24 hours:  Blood pressure 131/85, pulse 86, temperature 97.1 F (36.2 C), temperature source Oral, resp. rate 22, height 5' 9.5" (1.765 m), weight 252 lb (114.306 kg).    HEENT: No thrush or ulcers Resp: End inspiratory rhonchi at the right greater than left base, no respiratory distress Cardio: Regular rate and rhythm GI: No hepatosplenomegaly Vascular: No leg edema   Lab Results:  Lab Results  Component Value Date   WBC 4.9 06/09/2012   HGB 12.9* 06/09/2012   HCT 37.1* 06/09/2012   MCV 88.1 06/09/2012   PLT 122* 06/09/2012   ANC 2.6  Serum M spike on 05/11/2012-2.47 IgG on 05/11/2012-2350    Medications: I have reviewed the patient's current medications.  Assessment/Plan: 1. Multiple myeloma, status post treatment with melphalan/prednisone/thalidomide beginning in November 2009. The thalidomide was increased to 200 mg daily beginning 09/11/2008. The serum M spike was slightly improved on 03/18/2009 and slightly increased on 05/09/2009. He has been maintained off of specific therapy for multiple myeloma since September 2010. The serum M spike was increased on 10/20/2011 at 4.6. He began Revlimid on 11/11/2011. He takes weekly dexamethasone. He began cycle 2 of Revlimid on 12/09/2011. The serum M spike was improved on 12/31/2011. The serum M spike was slightly higher on 01/28/2012. He began cycle 4 on 02/03/2012. He developed recurrent hypercalcemia. Treatment was changed to Cytoxan/Velcade/Decadron beginning 03/02/2012. The serum M spike was lower on 02/25/2012. The serum M spike was lower on 05/11/2012. 2. Chronic "dizziness." 3. Chest x-ray 11/01/2008 with a patchy opacity at the right midlung suspicious for  pneumonia. He was treated with Avelox. 4. Low back pain with a radicular component. An MRI on 10/07/2008 showed no involvement of the lumbosacral spine with myeloma. 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 5. Bilateral neck pain, status post a CT 05/17/2008 with findings of spinal stenosis and osteoarthritis. 6. 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 Red cell microcytosis, likely related to hemoglobin C trait. A ferritin level returned elevated and a stool Hemoccult was negative 01/23/2010. He underwent a colonoscopy in 2009. 8. Elevated beta-2 microglobulin level. 9. History of hypertension. 10. History of a herniated disk at L4-L5 on MRI an 01/11/2002. 11. Hyperlipidemia. 12. Gastroesophageal reflux disease. 13. History of atypical angina. 14. History of rectal bleeding-large external hemorrhoids noted 02/25/2012. The stool was Hemoccult positive. He has a history of colon polyps. He is followed by [facility]. He was diagnosed with hemorrhoids in September of 2013 while hospitalized 15. Superficial venous thrombosis of the greater saphenous vein on a Doppler 09/27/2008. Negative for deep vein thrombosis. Symptoms improved with aspirin. 16. Neck pain with numbness/tingling in the arms, hands, and low back with proximal right leg weakness. An MRI of the cervical spine 05/30/2009 showed multilevel spondylosis with mild cord edema at C4-C5 and C5-C6. He was noted to have an enhancing lesion of the cord at T3-T4 of unclear etiology. In the lumbar spine there was no change in the spondylosis at L3-L4 and L4-L5. There was no involvement of the cervical or lumbar spine with myeloma. He is status post C4-C5, C5-C6, and C6-C7 anterior cervical diskectomy with fusion by  Dr. Jordan Likes 08/01/2009. 17. History of mild thrombocytopenia. 18. Indeterminate-age  deep vein thrombosis of the left lower extremity on a venous Doppler 08/15/2009. There were also findings consistent with superficial thrombosis involving the right lower extremity 08/15/2009. Coumadin was discontinued in October 2011. 19. Type 2 diabetes. 20. History of hematuria, followed at [facility]. Urinalysis was negative for blood on 07/23/2011. 21. Radicular back pain with MRI of the lumbar spine on 10/19/2011 showing nerve root impingement at L2-L3, L3-L4 and L4-L5 with the most severe at the right L3 nerve roots due to a large disc fragment. Associated right leg weakness. He underwent right L2-3 decompressive laminotomy with right L2 and L3 decompressive foraminotomy; right L2-3 microdiscectomy on 11/01/2011. 22. Constipation He continues a laxative regimen. 23. Hospitalization 11/08/2011 through 11/10/2011 with orthostatic hypotension/dizziness. He improved with intravenous hydration. He had mild hypotension when here on 11/18/2011. We recommended discontinuing Norvasc and Proscar. 24. Hypercalcemia status post pamidronate 11/03/2011. Recurrent hypercalcemia 02/16/2012 status post Zometa. The calcium has remained in normal range. 25. Fractured tooth with associated pain. Status post a tooth extraction by Dr. Kristin Bruins   Disposition:  Mr. Whidby appears stable. The plan is to continue Cytoxan/Velcade/Decadron. We will followup on a chemistry panel and serum protein electrophoresis from today. He will return for an office visit in one month.   Thornton Papas, MD  06/09/2012  10:18 AM

## 2012-06-09 NOTE — Telephone Encounter (Signed)
gv and printed pt appt schedule for DEC °

## 2012-06-12 ENCOUNTER — Telehealth: Payer: Self-pay | Admitting: Internal Medicine

## 2012-06-12 DIAGNOSIS — G4733 Obstructive sleep apnea (adult) (pediatric): Secondary | ICD-10-CM

## 2012-06-12 NOTE — Telephone Encounter (Signed)
I spoke with spouse and she stated pt machine is leaking water out from the machine. Pt has not been able to sleep in 2 days. They took machine over to [facility] and was advised they needed an order sent from [place] before they can do anything. i have sent order. Nothing further was needed.

## 2012-06-13 LAB — PROTEIN ELECTROPHORESIS, SERUM
Albumin ELP: 49.5 % — ABNORMAL LOW (ref 55.8–66.1)
Alpha-1-Globulin: 3.4 % (ref 2.9–4.9)
Alpha-2-Globulin: 9.5 % (ref 7.1–11.8)
Beta 2: 1.9 % — ABNORMAL LOW (ref 3.2–6.5)
Beta Globulin: 5.5 % (ref 4.7–7.2)
Gamma Globulin: 30.2 % — ABNORMAL HIGH (ref 11.1–18.8)
M-Spike, %: 2.43 g/dL
Total Protein, Serum Electrophoresis: 8.3 g/dL (ref 6.0–8.3)

## 2012-06-14 ENCOUNTER — Telehealth: Payer: Self-pay | Admitting: *Deleted

## 2012-06-14 NOTE — Telephone Encounter (Signed)
[facility] faxed refill request for Cyclophosphamide.  Request to MD for review.

## 2012-06-15 MED ORDER — CYCLOPHOSPHAMIDE 50 MG PO TABS: 600.0000 mg | ORAL_TABLET | ORAL | Status: DC

## 2012-06-15 NOTE — Addendum Note (Signed)
Addended by: Augusto Garbe on: 06/15/2012 01:53 PM   Modules accepted: Orders

## 2012-06-15 NOTE — Telephone Encounter (Signed)
[facility] faxed confirmation of facsimile receipt for oral cytoxan.  Will verify insurance and make delivery arrangements.

## 2012-06-16 ENCOUNTER — Telehealth: Payer: Self-pay | Admitting: Internal Medicine

## 2012-06-16 DIAGNOSIS — C9000 Multiple myeloma not having achieved remission: Secondary | ICD-10-CM

## 2012-06-16 NOTE — Telephone Encounter (Signed)
Pt requesting rx for  ambien 10 mg <> 1/2 tablet at bedtime prn #30 3 refills Allergies  Allergen Reactions  . Penicillins Hives and Rash

## 2012-06-17 NOTE — Telephone Encounter (Signed)
Ok to refill ambien

## 2012-06-19 MED ORDER — ZOLPIDEM TARTRATE 10 MG PO TABS: 5.0000 mg | ORAL_TABLET | Freq: Every evening | ORAL | Status: DC | PRN

## 2012-06-19 NOTE — Telephone Encounter (Signed)
Refill called to pharmacy. Wife is aware.

## 2012-06-20 ENCOUNTER — Encounter (HOSPITAL_COMMUNITY): Payer: Self-pay | Admitting: Dentistry

## 2012-06-20 ENCOUNTER — Ambulatory Visit (HOSPITAL_COMMUNITY): Payer: Self-pay | Admitting: Dentistry

## 2012-06-20 ENCOUNTER — Telehealth: Payer: Self-pay | Admitting: Internal Medicine

## 2012-06-20 VITALS — BP 165/85 | HR 88 | Temp 97.4°F

## 2012-06-20 DIAGNOSIS — K08109 Complete loss of teeth, unspecified cause, unspecified class: Secondary | ICD-10-CM

## 2012-06-20 DIAGNOSIS — K0530 Chronic periodontitis, unspecified: Secondary | ICD-10-CM

## 2012-06-20 DIAGNOSIS — K08409 Partial loss of teeth, unspecified cause, unspecified class: Secondary | ICD-10-CM

## 2012-06-20 DIAGNOSIS — K036 Deposits [accretions] on teeth: Secondary | ICD-10-CM

## 2012-06-20 DIAGNOSIS — C9000 Multiple myeloma not having achieved remission: Secondary | ICD-10-CM

## 2012-06-20 DIAGNOSIS — K08199 Complete loss of teeth due to other specified cause, unspecified class: Secondary | ICD-10-CM

## 2012-06-20 DIAGNOSIS — M264 Malocclusion, unspecified: Secondary | ICD-10-CM

## 2012-06-20 DIAGNOSIS — K0889 Other specified disorders of teeth and supporting structures: Secondary | ICD-10-CM

## 2012-06-20 NOTE — Patient Instructions (Signed)
The patient is to contact dental medicine when he is ready to proceed with extraction of remaining teeth in the operating room. The patient currently wishes to discuss these dental procedures with Dr. Truett Perna at his late December 2013 appointment. The patient is to call dental medicine with his decision in early January 2014. In the meantime, the patient is to contact dental medicine for evaluation of exposed bone from the extraction site as indicated. Dr. Kristin Bruins

## 2012-06-20 NOTE — Progress Notes (Signed)
06/20/2012  Patient:            [name] Date of Birth:  [birth year] MRN:                [id number]  BP 165/85  Pulse 88  Temp 97.4 F (36.3 C) (Oral)  Patient is status post extraction of tooth #6 on 05/24/2012.  SUBJECTIVE: Patient without complaints. He denies any acute pain or dental problems at this time.  EXAM: No sign of infection, heme, or ooze. Extraction site is completely covered by soft tissue and is healing in appropriately. There is No exposed bone noted. The patient has chronic, advanced periodontal disease with plaque and calculus accumulations noted. Patient with multiple missing teeth and malocclusion. Multiple teeth are multiple.  ASSESSMENT: Post operative course is consistent with dental extraction performed on 05/24/12 with no exposed bone or evidence of osteonecrosis of the jaw related to previous bisphosphonate therapy. The patient has chronic, advanced periodontal disease with multiple mobile teeth.  These teeth will more than likely need to be extracted in the future.   The ideal plan would allow for removal of remaining teeth with alveoloplasty as indicated with subsequent fabrication of upper lower complete denture after adequate healing. Hopefully these extractions would be performed before additional IV bisphosphonate or antiresorptive drug therapy has been given.  PLAN: 1. Continue salt water rinses as needed. 2. The patient is to discuss extraction of remaining teeth with Dr. Truett Perna in late December 2013. The patient will then contact dental medicine with his decision in early January 2014. 3. The patient to call if exposed bone is noted in the area of previous extraction.   Charlynne Pander, DDS

## 2012-06-20 NOTE — Telephone Encounter (Signed)
Paperwork and message on CY's cart.

## 2012-06-22 ENCOUNTER — Ambulatory Visit: Payer: Self-pay

## 2012-06-22 ENCOUNTER — Ambulatory Visit (HOSPITAL_BASED_OUTPATIENT_CLINIC_OR_DEPARTMENT_OTHER): Payer: Medicare Other

## 2012-06-22 ENCOUNTER — Other Ambulatory Visit: Payer: Self-pay | Admitting: Oncology

## 2012-06-22 ENCOUNTER — Other Ambulatory Visit (HOSPITAL_BASED_OUTPATIENT_CLINIC_OR_DEPARTMENT_OTHER): Payer: Medicare Other | Admitting: Lab

## 2012-06-22 VITALS — BP 130/75 | HR 82 | Temp 98.0°F | Resp 20

## 2012-06-22 DIAGNOSIS — C9000 Multiple myeloma not having achieved remission: Secondary | ICD-10-CM

## 2012-06-22 DIAGNOSIS — Z5112 Encounter for antineoplastic immunotherapy: Secondary | ICD-10-CM

## 2012-06-22 LAB — CBC WITH DIFFERENTIAL/PLATELET
BASO%: 0.8 % (ref 0.0–2.0)
EOS%: 1.8 % (ref 0.0–7.0)
HCT: 36.2 % — ABNORMAL LOW (ref 38.4–49.9)
MCH: 30.7 pg (ref 27.2–33.4)
MCHC: 35 g/dL (ref 32.0–36.0)
MCV: 87.6 fL (ref 79.3–98.0)
MONO%: 14.5 % — ABNORMAL HIGH (ref 0.0–14.0)
NEUT%: 57.9 % (ref 39.0–75.0)
RDW: 17.8 % — ABNORMAL HIGH (ref 11.0–14.6)
lymph#: 1.4 10*3/uL (ref 0.9–3.3)

## 2012-06-22 MED ORDER — ONDANSETRON HCL 8 MG PO TABS
8.0000 mg | ORAL_TABLET | Freq: Once | ORAL | Status: AC
  Administered 2012-06-22: 8 mg via ORAL

## 2012-06-22 MED ORDER — DEXAMETHASONE 4 MG PO TABS
40.0000 mg | ORAL_TABLET | Freq: Once | ORAL | Status: AC
  Administered 2012-06-22: 40 mg via ORAL

## 2012-06-22 MED ORDER — BORTEZOMIB CHEMO SQ INJECTION 3.5 MG (2.5MG/ML)
1.3000 mg/m2 | Freq: Once | INTRAMUSCULAR | Status: AC
  Administered 2012-06-22: 3 mg via SUBCUTANEOUS
  Filled 2012-06-22: qty 3

## 2012-06-22 NOTE — Patient Instructions (Signed)
[facility] Discharge Instructions for Patients Receiving Chemotherapy  Today you received the following chemotherapy agents Velcade.  To help prevent nausea and vomiting after your treatment, we encourage you to take your nausea medication.   If you develop nausea and vomiting that is not controlled by your nausea medication, call the clinic. If it is after clinic hours your family physician or the after hours number for the clinic or go to the Emergency Department.   BELOW ARE SYMPTOMS THAT SHOULD BE REPORTED IMMEDIATELY:  *FEVER GREATER THAN 100.5 F  *CHILLS WITH OR WITHOUT FEVER  NAUSEA AND VOMITING THAT IS NOT CONTROLLED WITH YOUR NAUSEA MEDICATION  *UNUSUAL SHORTNESS OF BREATH  *UNUSUAL BRUISING OR BLEEDING  TENDERNESS IN MOUTH AND THROAT WITH OR WITHOUT PRESENCE OF ULCERS  *URINARY PROBLEMS  *BOWEL PROBLEMS  UNUSUAL RASH Items with * indicate a potential emergency and should be followed up as soon as possible.  One of the nurses will contact you 24 hours after your treatment. Please let the nurse know about any problems that you may have experienced. Feel free to call the clinic you have any questions or concerns. The clinic phone number is [phone].   I have been informed and understand all the instructions given to me. I know to contact the clinic, my physician, or go to the Emergency Department if any problems should occur. I do not have any questions at this time, but understand that I may call the clinic during office hours   should I have any questions or need assistance in obtaining follow up care.    __________________________________________  _____________  __________ Signature of Patient or Authorized Representative            Date                   Time    __________________________________________ Nurse's Signature    

## 2012-06-27 NOTE — Telephone Encounter (Signed)
Florentina Addison, are you/CY still working on this for the pt?

## 2012-06-28 ENCOUNTER — Other Ambulatory Visit: Payer: Self-pay | Admitting: Oncology

## 2012-06-28 DIAGNOSIS — C9000 Multiple myeloma not having achieved remission: Secondary | ICD-10-CM

## 2012-06-29 ENCOUNTER — Ambulatory Visit: Payer: Self-pay

## 2012-06-29 ENCOUNTER — Other Ambulatory Visit (HOSPITAL_BASED_OUTPATIENT_CLINIC_OR_DEPARTMENT_OTHER): Payer: Medicare Other

## 2012-06-29 ENCOUNTER — Other Ambulatory Visit: Payer: Self-pay | Admitting: Oncology

## 2012-06-29 ENCOUNTER — Ambulatory Visit (HOSPITAL_BASED_OUTPATIENT_CLINIC_OR_DEPARTMENT_OTHER): Payer: Medicare Other

## 2012-06-29 VITALS — BP 160/84 | HR 78 | Temp 98.5°F

## 2012-06-29 DIAGNOSIS — C9000 Multiple myeloma not having achieved remission: Secondary | ICD-10-CM

## 2012-06-29 DIAGNOSIS — Z5112 Encounter for antineoplastic immunotherapy: Secondary | ICD-10-CM

## 2012-06-29 LAB — CBC WITH DIFFERENTIAL/PLATELET
BASO%: 0.6 % (ref 0.0–2.0)
Basophils Absolute: 0 10*3/uL (ref 0.0–0.1)
EOS%: 1.3 % (ref 0.0–7.0)
HGB: 12.6 g/dL — ABNORMAL LOW (ref 13.0–17.1)
MCH: 30.4 pg (ref 27.2–33.4)
MCV: 87.2 fL (ref 79.3–98.0)
MONO%: 15.7 % — ABNORMAL HIGH (ref 0.0–14.0)
RBC: 4.15 10*6/uL — ABNORMAL LOW (ref 4.20–5.82)
RDW: 17.3 % — ABNORMAL HIGH (ref 11.0–14.6)
lymph#: 1.5 10*3/uL (ref 0.9–3.3)

## 2012-06-29 MED ORDER — DEXAMETHASONE 4 MG PO TABS
40.0000 mg | ORAL_TABLET | Freq: Once | ORAL | Status: AC
  Administered 2012-06-29: 40 mg via ORAL

## 2012-06-29 MED ORDER — BORTEZOMIB CHEMO SQ INJECTION 3.5 MG (2.5MG/ML)
1.3000 mg/m2 | Freq: Once | INTRAMUSCULAR | Status: AC
  Administered 2012-06-29: 3 mg via SUBCUTANEOUS
  Filled 2012-06-29: qty 3

## 2012-06-29 MED ORDER — ONDANSETRON HCL 8 MG PO TABS
8.0000 mg | ORAL_TABLET | Freq: Once | ORAL | Status: AC
  Administered 2012-06-29: 8 mg via ORAL

## 2012-06-29 NOTE — Telephone Encounter (Signed)
Given to [place]. Insurance will only pay for 90 days per year of sleep med/ zolpidem. The rest he will need to self-pay if he wants more.

## 2012-06-29 NOTE — Patient Instructions (Addendum)
[facility] Discharge Instructions for Patients Receiving Chemotherapy  Today you received the following chemotherapy agents:  Velcade  To help prevent nausea and vomiting after your treatment, we encourage you to take your nausea medication as ordered per MD.    If you develop nausea and vomiting that is not controlled by your nausea medication, call the clinic. If it is after clinic hours your family physician or the after hours number for the clinic or go to the Emergency Department.   BELOW ARE SYMPTOMS THAT SHOULD BE REPORTED IMMEDIATELY:  *FEVER GREATER THAN 100.5 F  *CHILLS WITH OR WITHOUT FEVER  NAUSEA AND VOMITING THAT IS NOT CONTROLLED WITH YOUR NAUSEA MEDICATION  *UNUSUAL SHORTNESS OF BREATH  *UNUSUAL BRUISING OR BLEEDING  TENDERNESS IN MOUTH AND THROAT WITH OR WITHOUT PRESENCE OF ULCERS  *URINARY PROBLEMS  *BOWEL PROBLEMS  UNUSUAL RASH Items with * indicate a potential emergency and should be followed up as soon as possible.   Please let the nurse know about any problems that you may have experienced. Feel free to call the clinic you have any questions or concerns. The clinic phone number is [phone].   I have been informed and understand all the instructions given to me. I know to contact the clinic, my physician, or go to the Emergency Department if any problems should occur. I do not have any questions at this time, but understand that I may call the clinic during office hours   should I have any questions or need assistance in obtaining follow up care.    __________________________________________  _____________  __________ Signature of Patient or Authorized Representative            Date                   Time    __________________________________________ Nurse's Signature    

## 2012-06-30 ENCOUNTER — Other Ambulatory Visit: Payer: Self-pay | Admitting: Certified Registered Nurse Anesthetist

## 2012-07-03 NOTE — Telephone Encounter (Signed)
Spoke with wife-aware and will relay to patient. Nothing more needed.

## 2012-07-05 ENCOUNTER — Other Ambulatory Visit: Payer: Self-pay | Admitting: Oncology

## 2012-07-06 ENCOUNTER — Telehealth: Payer: Self-pay | Admitting: *Deleted

## 2012-07-06 ENCOUNTER — Ambulatory Visit (HOSPITAL_BASED_OUTPATIENT_CLINIC_OR_DEPARTMENT_OTHER): Payer: Medicare Other

## 2012-07-06 ENCOUNTER — Ambulatory Visit (HOSPITAL_BASED_OUTPATIENT_CLINIC_OR_DEPARTMENT_OTHER): Payer: Medicare Other | Admitting: Oncology

## 2012-07-06 ENCOUNTER — Other Ambulatory Visit (HOSPITAL_BASED_OUTPATIENT_CLINIC_OR_DEPARTMENT_OTHER): Payer: Medicare Other | Admitting: Lab

## 2012-07-06 ENCOUNTER — Telehealth: Payer: Self-pay | Admitting: Oncology

## 2012-07-06 VITALS — BP 144/84 | HR 80 | Temp 97.0°F | Resp 18 | Ht 69.5 in | Wt 253.7 lb

## 2012-07-06 DIAGNOSIS — J00 Acute nasopharyngitis [common cold]: Secondary | ICD-10-CM

## 2012-07-06 DIAGNOSIS — Z5112 Encounter for antineoplastic immunotherapy: Secondary | ICD-10-CM

## 2012-07-06 DIAGNOSIS — C9000 Multiple myeloma not having achieved remission: Secondary | ICD-10-CM

## 2012-07-06 LAB — CBC WITH DIFFERENTIAL/PLATELET
BASO%: 0.2 % (ref 0.0–2.0)
Eosinophils Absolute: 0.1 10*3/uL (ref 0.0–0.5)
MCHC: 36.1 g/dL — ABNORMAL HIGH (ref 32.0–36.0)
MCV: 80.8 fL (ref 79.3–98.0)
MONO%: 11.9 % (ref 0.0–14.0)
NEUT#: 2.6 10*3/uL (ref 1.5–6.5)
RBC: 4.42 10*6/uL (ref 4.20–5.82)
RDW: 16.7 % — ABNORMAL HIGH (ref 11.0–14.6)
WBC: 4.5 10*3/uL (ref 4.0–10.3)
nRBC: 0 % (ref 0–0)

## 2012-07-06 LAB — BASIC METABOLIC PANEL (CC13)
CO2: 25 mEq/L (ref 22–29)
Calcium: 8.7 mg/dL (ref 8.4–10.4)
Creatinine: 0.8 mg/dL (ref 0.7–1.3)
Sodium: 138 mEq/L (ref 136–145)

## 2012-07-06 MED ORDER — DEXAMETHASONE 4 MG PO TABS
40.0000 mg | ORAL_TABLET | Freq: Once | ORAL | Status: AC
  Administered 2012-07-06: 40 mg via ORAL

## 2012-07-06 MED ORDER — BORTEZOMIB CHEMO SQ INJECTION 3.5 MG (2.5MG/ML)
1.3000 mg/m2 | Freq: Once | INTRAMUSCULAR | Status: AC
Start: 2012-07-06 — End: 2012-07-06
  Administered 2012-07-06: 3 mg via SUBCUTANEOUS
  Filled 2012-07-06: qty 3

## 2012-07-06 MED ORDER — ONDANSETRON HCL 8 MG PO TABS
8.0000 mg | ORAL_TABLET | Freq: Once | ORAL | Status: AC
  Administered 2012-07-06: 8 mg via ORAL

## 2012-07-06 MED ORDER — DEXAMETHASONE 4 MG PO TABS: 20.0000 mg | ORAL_TABLET | ORAL | Status: DC

## 2012-07-06 NOTE — Patient Instructions (Addendum)
[facility] Discharge Instructions for Patients Receiving Chemotherapy  Today you received the following chemotherapy agents :  Velcade,  Dexamethasone, and  Zofran .  Cytoxan meds from home.  To help prevent nausea and vomiting after your treatment, we encourage you to take your nausea medication as instructed by your physician.    If you develop nausea and vomiting that is not controlled by your nausea medication, call the clinic. If it is after clinic hours your family physician or the after hours number for the clinic or go to the Emergency Department.   BELOW ARE SYMPTOMS THAT SHOULD BE REPORTED IMMEDIATELY:  *FEVER GREATER THAN 100.5 F  *CHILLS WITH OR WITHOUT FEVER  NAUSEA AND VOMITING THAT IS NOT CONTROLLED WITH YOUR NAUSEA MEDICATION  *UNUSUAL SHORTNESS OF BREATH  *UNUSUAL BRUISING OR BLEEDING  TENDERNESS IN MOUTH AND THROAT WITH OR WITHOUT PRESENCE OF ULCERS  *URINARY PROBLEMS  *BOWEL PROBLEMS  UNUSUAL RASH Items with * indicate a potential emergency and should be followed up as soon as possible.  One of the nurses will contact you 24 hours after your treatment. Please let the nurse know about any problems that you may have experienced. Feel free to call the clinic you have any questions or concerns. The clinic phone number is [phone].   I have been informed and understand all the instructions given to me. I know to contact the clinic, my physician, or go to the Emergency Department if any problems should occur. I do not have any questions at this time, but understand that I may call the clinic during office hours   should I have any questions or need assistance in obtaining follow up care.    __________________________________________  _____________  __________ Signature of Patient or Authorized Representative            Date                   Time    __________________________________________ Nurse's Signature

## 2012-07-06 NOTE — Telephone Encounter (Signed)
Per staff message and POF I have scheduled appts.  JMW  

## 2012-07-06 NOTE — Progress Notes (Signed)
[facility]    OFFICE PROGRESS NOTE   INTERVAL HISTORY:   He returns as scheduled. He currently has a "cold "with rhinorrhea, redness/watering of the eyes and chest discomfort. He continues weekly Cytoxan and Decadron. He is scheduled to complete another cycle of Velcade today.  Objective:  Vital signs in last 24 hours:  Blood pressure 144/84, pulse 80, temperature 97 F (36.1 C), temperature source Oral, resp. rate 18, height 5' 9.5" (1.765 m), weight 253 lb 11.2 oz (115.078 kg).    HEENT: Mild conjunctival erythema bilaterally, no thrush or ulcers Resp: Lungs with a few in inspiratory rhonchi at the posterior basis, no respiratory distress Cardio: Regular rate and rhythm GI: No hepatosplenomegaly, nontender Vascular: No leg edema   Lab Results:  Lab Results  Component Value Date   WBC 4.5 07/06/2012   HGB 12.9* 07/06/2012   HCT 35.7* 07/06/2012   MCV 80.8 07/06/2012   PLT 124* 07/06/2012   ANC 2.6  06/09/2012-serum M spike 2.43   Medications: I have reviewed the patient's current medications.  Assessment/Plan: 1. Multiple myeloma, status post treatment with melphalan/prednisone/thalidomide beginning in November 2009. The thalidomide was increased to 200 mg daily beginning 09/11/2008. The serum M spike was slightly improved on 03/18/2009 and slightly increased on 05/09/2009. He has been maintained off of specific therapy for multiple myeloma since September 2010. The serum M spike was increased on 10/20/2011 at 4.6. He began Revlimid on 11/11/2011. He takes weekly dexamethasone. He began cycle 2 of Revlimid on 12/09/2011. The serum M spike was improved on 12/31/2011. The serum M spike was slightly higher on 01/28/2012. He began cycle 4 on 02/03/2012. He developed recurrent hypercalcemia. Treatment was changed to Cytoxan/Velcade/Decadron beginning 03/02/2012. The serum M spike was lower on 02/25/2012. The serum M spike was stable on  06/09/2012. 2. Chronic "dizziness." 3. Chest x-ray 11/01/2008 with a patchy opacity at the right midlung suspicious for pneumonia. He was treated with Avelox. 4. Low back pain with a radicular component. An MRI on 10/07/2008 showed no involvement of the lumbosacral spine with myeloma. 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 5. Bilateral neck pain, status post a CT 05/17/2008 with findings of spinal stenosis and osteoarthritis. 6. 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 Red cell microcytosis, likely related to hemoglobin C trait. A ferritin level returned elevated and a stool Hemoccult was negative 01/23/2010. He underwent a colonoscopy in 2009. 8. Elevated beta-2 microglobulin level. 9. History of hypertension. 10. History of a herniated disk at L4-L5 on MRI an 01/11/2002. 11. Hyperlipidemia. 12. Gastroesophageal reflux disease. 13. History of atypical angina. 14. History of rectal bleeding-large external hemorrhoids noted 02/25/2012. The stool was Hemoccult positive. He has a history of colon polyps. He is followed by [facility]. He was diagnosed with hemorrhoids in September of 2013 while hospitalized 15. Superficial venous thrombosis of the greater saphenous vein on a Doppler 09/27/2008. Negative for deep vein thrombosis. Symptoms improved with aspirin. 16. Neck pain with numbness/tingling in the arms, hands, and low back with proximal right leg weakness. An MRI of the cervical spine 05/30/2009 showed multilevel spondylosis with mild cord edema at C4-C5 and C5-C6. He was noted to have an enhancing lesion of the cord at T3-T4 of unclear etiology. In the lumbar spine there was no change in the spondylosis at L3-L4 and L4-L5. There was no involvement of the cervical or  lumbar spine with myeloma. He is status post C4-C5, C5-C6, and C6-C7  anterior cervical diskectomy with fusion by Dr. Jordan Likes 08/01/2009. 17. History of mild thrombocytopenia. 18. Indeterminate-age deep vein thrombosis of the left lower extremity on a venous Doppler 08/15/2009. There were also findings consistent with superficial thrombosis involving the right lower extremity 08/15/2009. Coumadin was discontinued in October 2011. 19. Type 2 diabetes. 20. History of hematuria, followed at [facility]. Urinalysis was negative for blood on 07/23/2011. 21. Radicular back pain with MRI of the lumbar spine on 10/19/2011 showing nerve root impingement at L2-L3, L3-L4 and L4-L5 with the most severe at the right L3 nerve roots due to a large disc fragment. Associated right leg weakness. He underwent right L2-3 decompressive laminotomy with right L2 and L3 decompressive foraminotomy; right L2-3 microdiscectomy on 11/01/2011. 22. Constipation He continues a laxative regimen. 23. Hospitalization 11/08/2011 through 11/10/2011 with orthostatic hypotension/dizziness. He improved with intravenous hydration. He had mild hypotension when here on 11/18/2011. We recommended discontinuing Norvasc and Proscar. 24. Hypercalcemia status post pamidronate 11/03/2011. Recurrent hypercalcemia 02/16/2012 status post Zometa. The calcium has remained in normal range. 25. Fractured tooth with associated pain. Status post a tooth extraction by Dr. Kristin Bruins, he is to be scheduled for multiple tooth extractions in the near future. We will place further Zometa on hold.  Disposition:  Mr. Radler is stable from a hematologic standpoint. He will complete another cycle of Cytoxan/Velcade/Decadron today. He continues weekly Cytoxan/Decadron at home. He will begin another cycle of Velcade on 07/21/2011. He will return for an office visit on 08/04/2011. We will followup on the serum M spike and IgG level from today. Mr. Fiveash most likely has a viral upper respiratory infection. He will contact us for a fever or  new symptoms.   Thornton Papas, MD  07/06/2012  3:26 PM

## 2012-07-06 NOTE — Telephone Encounter (Signed)
Gave pt appt for January 2014 lab, chemo and ML

## 2012-07-07 ENCOUNTER — Other Ambulatory Visit: Payer: Self-pay | Admitting: *Deleted

## 2012-07-07 NOTE — Telephone Encounter (Signed)
THIS REFILL REQUEST FOR CYCLOPHOSPHAMIDE WAS GIVEN TO DR.SHERRILL'S NURSE, SUSAN COWARD,RN.

## 2012-07-10 LAB — PROTEIN ELECTROPHORESIS, SERUM
Alpha-1-Globulin: 3.4 % (ref 2.9–4.9)
Gamma Globulin: 28.8 % — ABNORMAL HIGH (ref 11.1–18.8)
M-Spike, %: 1.98 g/dL

## 2012-07-10 LAB — IGG: IgG (Immunoglobin G), Serum: 2310 mg/dL — ABNORMAL HIGH (ref 650–1600)

## 2012-07-11 ENCOUNTER — Other Ambulatory Visit: Payer: Self-pay | Admitting: *Deleted

## 2012-07-11 MED ORDER — CYCLOPHOSPHAMIDE 50 MG PO TABS: 600.0000 mg | ORAL_TABLET | ORAL | Status: DC

## 2012-07-12 HISTORY — PX: HEMORRHOID BANDING: SHX5850

## 2012-07-19 ENCOUNTER — Other Ambulatory Visit: Payer: Self-pay | Admitting: Oncology

## 2012-07-20 ENCOUNTER — Ambulatory Visit (HOSPITAL_BASED_OUTPATIENT_CLINIC_OR_DEPARTMENT_OTHER): Payer: Medicare Other

## 2012-07-20 ENCOUNTER — Other Ambulatory Visit (HOSPITAL_BASED_OUTPATIENT_CLINIC_OR_DEPARTMENT_OTHER): Payer: Self-pay | Admitting: Lab

## 2012-07-20 VITALS — BP 129/67 | HR 86 | Temp 97.8°F

## 2012-07-20 DIAGNOSIS — Z5112 Encounter for antineoplastic immunotherapy: Secondary | ICD-10-CM

## 2012-07-20 DIAGNOSIS — C9000 Multiple myeloma not having achieved remission: Secondary | ICD-10-CM

## 2012-07-20 LAB — CBC WITH DIFFERENTIAL/PLATELET
BASO%: 0.7 % (ref 0.0–2.0)
LYMPH%: 22.8 % (ref 14.0–49.0)
MCHC: 34.9 g/dL (ref 32.0–36.0)
MONO#: 0.7 10*3/uL (ref 0.1–0.9)
Platelets: 156 10*3/uL (ref 140–400)
RBC: 4.2 10*6/uL (ref 4.20–5.82)
RDW: 17.3 % — ABNORMAL HIGH (ref 11.0–14.6)
WBC: 5.4 10*3/uL (ref 4.0–10.3)

## 2012-07-20 MED ORDER — DEXAMETHASONE 4 MG PO TABS
40.0000 mg | ORAL_TABLET | Freq: Once | ORAL | Status: AC
  Administered 2012-07-20: 40 mg via ORAL

## 2012-07-20 MED ORDER — BORTEZOMIB CHEMO SQ INJECTION 3.5 MG (2.5MG/ML)
1.3000 mg/m2 | Freq: Once | INTRAMUSCULAR | Status: AC
  Administered 2012-07-20: 3 mg via SUBCUTANEOUS
  Filled 2012-07-20: qty 3

## 2012-07-20 MED ORDER — ONDANSETRON HCL 8 MG PO TABS
8.0000 mg | ORAL_TABLET | Freq: Once | ORAL | Status: AC
  Administered 2012-07-20: 8 mg via ORAL

## 2012-07-20 NOTE — Patient Instructions (Addendum)
[facility] Discharge Instructions for Patients Receiving Chemotherapy  Today you received the following chemotherapy agents Velcade.  To help prevent nausea and vomiting after your treatment, we encourage you to take your nausea medication.   If you develop nausea and vomiting that is not controlled by your nausea medication, call the clinic. If it is after clinic hours your family physician or the after hours number for the clinic or go to the Emergency Department.   BELOW ARE SYMPTOMS THAT SHOULD BE REPORTED IMMEDIATELY:  *FEVER GREATER THAN 100.5 F  *CHILLS WITH OR WITHOUT FEVER  NAUSEA AND VOMITING THAT IS NOT CONTROLLED WITH YOUR NAUSEA MEDICATION  *UNUSUAL SHORTNESS OF BREATH  *UNUSUAL BRUISING OR BLEEDING  TENDERNESS IN MOUTH AND THROAT WITH OR WITHOUT PRESENCE OF ULCERS  *URINARY PROBLEMS  *BOWEL PROBLEMS  UNUSUAL RASH Items with * indicate a potential emergency and should be followed up as soon as possible.  Feel free to call the clinic you have any questions or concerns. The clinic phone number is [phone].   

## 2012-07-26 ENCOUNTER — Other Ambulatory Visit: Payer: Self-pay | Admitting: Oncology

## 2012-07-26 ENCOUNTER — Encounter: Payer: Self-pay | Admitting: Oncology

## 2012-07-27 ENCOUNTER — Ambulatory Visit (HOSPITAL_BASED_OUTPATIENT_CLINIC_OR_DEPARTMENT_OTHER): Payer: Medicare Other

## 2012-07-27 ENCOUNTER — Other Ambulatory Visit (HOSPITAL_BASED_OUTPATIENT_CLINIC_OR_DEPARTMENT_OTHER): Payer: Medicare Other | Admitting: Lab

## 2012-07-27 VITALS — BP 126/64 | HR 72 | Temp 98.6°F | Resp 20

## 2012-07-27 DIAGNOSIS — C9000 Multiple myeloma not having achieved remission: Secondary | ICD-10-CM

## 2012-07-27 DIAGNOSIS — Z5112 Encounter for antineoplastic immunotherapy: Secondary | ICD-10-CM

## 2012-07-27 LAB — CBC WITH DIFFERENTIAL/PLATELET
Basophils Absolute: 0 10*3/uL (ref 0.0–0.1)
Eosinophils Absolute: 0.1 10*3/uL (ref 0.0–0.5)
HGB: 12.5 g/dL — ABNORMAL LOW (ref 13.0–17.1)
LYMPH%: 20.1 % (ref 14.0–49.0)
MCV: 85.7 fL (ref 79.3–98.0)
MONO#: 0.8 10*3/uL (ref 0.1–0.9)
MONO%: 13.2 % (ref 0.0–14.0)
NEUT#: 3.8 10*3/uL (ref 1.5–6.5)
Platelets: 140 10*3/uL (ref 140–400)
RDW: 17 % — ABNORMAL HIGH (ref 11.0–14.6)
WBC: 5.8 10*3/uL (ref 4.0–10.3)

## 2012-07-27 LAB — COMPREHENSIVE METABOLIC PANEL (CC13)
Albumin: 3.1 g/dL — ABNORMAL LOW (ref 3.5–5.0)
Alkaline Phosphatase: 51 U/L (ref 40–150)
BUN: 13 mg/dL (ref 7.0–26.0)
CO2: 24 mEq/L (ref 22–29)
Glucose: 108 mg/dl — ABNORMAL HIGH (ref 70–99)
Potassium: 4 mEq/L (ref 3.5–5.1)
Sodium: 137 mEq/L (ref 136–145)
Total Protein: 7.4 g/dL (ref 6.4–8.3)

## 2012-07-27 MED ORDER — ONDANSETRON HCL 8 MG PO TABS
8.0000 mg | ORAL_TABLET | Freq: Once | ORAL | Status: AC
  Administered 2012-07-27: 8 mg via ORAL

## 2012-07-27 MED ORDER — BORTEZOMIB CHEMO SQ INJECTION 3.5 MG (2.5MG/ML)
1.3000 mg/m2 | Freq: Once | INTRAMUSCULAR | Status: AC
  Administered 2012-07-27: 3 mg via SUBCUTANEOUS
  Filled 2012-07-27: qty 3

## 2012-07-27 MED ORDER — DEXAMETHASONE 4 MG PO TABS
40.0000 mg | ORAL_TABLET | Freq: Once | ORAL | Status: AC
  Administered 2012-07-27: 40 mg via ORAL

## 2012-07-27 NOTE — Patient Instructions (Signed)
Patient aware of next appointment; discharged home with no complaints. 

## 2012-08-03 ENCOUNTER — Telehealth: Payer: Self-pay | Admitting: *Deleted

## 2012-08-03 ENCOUNTER — Other Ambulatory Visit (HOSPITAL_BASED_OUTPATIENT_CLINIC_OR_DEPARTMENT_OTHER): Payer: Medicare Other | Admitting: Lab

## 2012-08-03 ENCOUNTER — Ambulatory Visit (HOSPITAL_BASED_OUTPATIENT_CLINIC_OR_DEPARTMENT_OTHER): Payer: Medicare Other

## 2012-08-03 ENCOUNTER — Ambulatory Visit (HOSPITAL_BASED_OUTPATIENT_CLINIC_OR_DEPARTMENT_OTHER): Payer: Medicare Other | Admitting: Nurse Practitioner

## 2012-08-03 VITALS — BP 126/74 | HR 82 | Temp 96.9°F | Resp 20 | Ht 69.5 in | Wt 254.8 lb

## 2012-08-03 DIAGNOSIS — D6481 Anemia due to antineoplastic chemotherapy: Secondary | ICD-10-CM

## 2012-08-03 DIAGNOSIS — E119 Type 2 diabetes mellitus without complications: Secondary | ICD-10-CM

## 2012-08-03 DIAGNOSIS — C9000 Multiple myeloma not having achieved remission: Secondary | ICD-10-CM

## 2012-08-03 DIAGNOSIS — M545 Low back pain: Secondary | ICD-10-CM

## 2012-08-03 DIAGNOSIS — Z5112 Encounter for antineoplastic immunotherapy: Secondary | ICD-10-CM

## 2012-08-03 LAB — CBC WITH DIFFERENTIAL/PLATELET
BASO%: 0.2 % (ref 0.0–2.0)
LYMPH%: 23.9 % (ref 14.0–49.0)
MCHC: 36 g/dL (ref 32.0–36.0)
MCV: 81.5 fL (ref 79.3–98.0)
MONO#: 0.9 10*3/uL (ref 0.1–0.9)
MONO%: 17 % — ABNORMAL HIGH (ref 0.0–14.0)
Platelets: 141 10*3/uL (ref 140–400)
RBC: 4.39 10*6/uL (ref 4.20–5.82)
RDW: 16.5 % — ABNORMAL HIGH (ref 11.0–14.6)
WBC: 5.2 10*3/uL (ref 4.0–10.3)
nRBC: 0 % (ref 0–0)

## 2012-08-03 MED ORDER — BORTEZOMIB CHEMO SQ INJECTION 3.5 MG (2.5MG/ML)
1.3000 mg/m2 | Freq: Once | INTRAMUSCULAR | Status: AC
  Administered 2012-08-03: 3 mg via SUBCUTANEOUS
  Filled 2012-08-03: qty 3

## 2012-08-03 MED ORDER — DEXAMETHASONE 4 MG PO TABS
40.0000 mg | ORAL_TABLET | Freq: Once | ORAL | Status: AC
  Administered 2012-08-03: 40 mg via ORAL

## 2012-08-03 MED ORDER — ONDANSETRON HCL 8 MG PO TABS
8.0000 mg | ORAL_TABLET | Freq: Once | ORAL | Status: AC
  Administered 2012-08-03: 8 mg via ORAL

## 2012-08-03 NOTE — Progress Notes (Signed)
OFFICE PROGRESS NOTE  Interval history:  [name] returns as scheduled. He continues Velcade and weekly Cytoxan/Decadron. Most recent M spike on 07/06/2012 was improved at 1.98 as compared to 2.43 on 06/09/2012. IgG was stable on 07/06/2012 at 2310 as compared to 2350 on 05/11/2012.  He overall is feeling well. He recently began eyedrops and has noticed improvement in the redness and watering. He continues to feel "stuffy" in the chest. He is intermittently short of breath. He mainly notes the shortness of breath with exertion. He denies chest pain. No cough. No fever. He occasionally wheezes. He denies nausea/vomiting. No mouth sores. No diarrhea. He continues to have a good appetite.   Objective: Blood pressure 126/74, pulse 82, temperature 96.9 F (36.1 C), temperature source Oral, resp. rate 20, height 5' 9.5" (1.765 m), weight 254 lb 12.8 oz (115.577 kg).  Mild conjunctival erythema. Lungs are clear. No wheezes or rales. Regular cardiac rhythm. Abdomen is soft and nontender. No organomegaly. Trace lower leg edema bilaterally. Calves are nontender.  Lab Results: Lab Results  Component Value Date   WBC 5.2 08/03/2012   HGB 12.9* 08/03/2012   HCT 35.8* 08/03/2012   MCV 81.5 08/03/2012   PLT 141 08/03/2012    Chemistry:    Chemistry      Component Value Date/Time   NA 137 07/27/2012 1106   NA 134* 03/21/2012 0430   K 4.0 07/27/2012 1106   K 3.6 03/21/2012 0430   CL 105 07/27/2012 1106   CL 102 03/21/2012 0430   CO2 24 07/27/2012 1106   CO2 27 03/21/2012 0430   BUN 13.0 07/27/2012 1106   BUN 5* 03/21/2012 0430   CREATININE 0.9 07/27/2012 1106   CREATININE 0.71 03/21/2012 0430      Component Value Date/Time   CALCIUM 9.0 07/27/2012 1106   CALCIUM 8.3* 03/21/2012 0430   ALKPHOS 51 07/27/2012 1106   ALKPHOS 66 03/20/2012 0135   AST 21 07/27/2012 1106   AST 14 03/20/2012 0135   ALT 11 07/27/2012 1106   ALT 15 03/20/2012 0135   BILITOT 0.67 07/27/2012 1106   BILITOT 0.2* 03/20/2012 0135        Studies/Results: No results found.  Medications: I have reviewed the patient's current medications.  Assessment/Plan:  1. Multiple myeloma, status post treatment with melphalan/prednisone/thalidomide beginning in November 2009. The thalidomide was increased to 200 mg daily beginning 09/11/2008. The serum M spike was slightly improved on 03/18/2009 and slightly increased on 05/09/2009. He has been maintained off of specific therapy for multiple myeloma since September 2010. The serum M spike was increased on 10/20/2011 at 4.6. He began Revlimid on 11/11/2011. He takes weekly dexamethasone. He began cycle 2 of Revlimid on 12/09/2011. The serum M spike was improved on 12/31/2011. The serum M spike was slightly higher on 01/28/2012. He began cycle 4 on 02/03/2012. He developed recurrent hypercalcemia. Treatment was changed to Cytoxan/Velcade/Decadron beginning 03/02/2012. The serum M spike was lower on 02/25/2012. The serum M spike was stable on 06/09/2012. The serum M spike was improved on 07/06/2012. 2. Chronic "dizziness." 3. Chest x-ray 11/01/2008 with a patchy opacity at the right midlung suspicious for pneumonia. He was treated with Avelox. 4. Low back pain with a radicular component. An MRI on 10/07/2008 showed no involvement of the lumbosacral spine with myeloma. 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 5. Bilateral neck pain, status post a CT 05/17/2008 with findings of spinal stenosis and  osteoarthritis. 6. 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 Red cell microcytosis, likely related to hemoglobin C trait. A ferritin level returned elevated and a stool Hemoccult was negative 01/23/2010. He underwent a colonoscopy in 2009. 8. Elevated beta-2 microglobulin level. 9. History of hypertension. 10. History of a herniated disk at  L4-L5 on MRI an 01/11/2002. 11. Hyperlipidemia. 12. Gastroesophageal reflux disease. 13. History of atypical angina. 14. History of rectal bleeding-large external hemorrhoids noted 02/25/2012. The stool was Hemoccult positive. He has a history of colon polyps. He is followed by [facility]. He was diagnosed with hemorrhoids in September of 2013 while hospitalized 15. Superficial venous thrombosis of the greater saphenous vein on a Doppler 09/27/2008. Negative for deep vein thrombosis. Symptoms improved with aspirin. 16. Neck pain with numbness/tingling in the arms, hands, and low back with proximal right leg weakness. An MRI of the cervical spine 05/30/2009 showed multilevel spondylosis with mild cord edema at C4-C5 and C5-C6. He was noted to have an enhancing lesion of the cord at T3-T4 of unclear etiology. In the lumbar spine there was no change in the spondylosis at L3-L4 and L4-L5. There was no involvement of the cervical or lumbar spine with myeloma. He is status post C4-C5, C5-C6, and C6-C7 anterior cervical diskectomy with fusion by Dr. Jordan Likes 08/01/2009. 17. History of mild thrombocytopenia. 18. Indeterminate-age deep vein thrombosis of the left lower extremity on a venous Doppler 08/15/2009. There were also findings consistent with superficial thrombosis involving the right lower extremity 08/15/2009. Coumadin was discontinued in October 2011. 19. Type 2 diabetes. 20. History of hematuria, followed at [facility]. Urinalysis was negative for blood on 07/23/2011. 21. Radicular back pain with MRI of the lumbar spine on 10/19/2011 showing nerve root impingement at L2-L3, L3-L4 and L4-L5 with the most severe at the right L3 nerve roots due to a large disc fragment. Associated right leg weakness. He underwent right L2-3 decompressive laminotomy with right L2 and L3 decompressive foraminotomy; right L2-3 microdiscectomy on 11/01/2011. 22. Constipation He continues a laxative  regimen. 23. Hospitalization 11/08/2011 through 11/10/2011 with orthostatic hypotension/dizziness. He improved with intravenous hydration. He had mild hypotension when here on 11/18/2011. We recommended discontinuing Norvasc and Proscar. 24. Hypercalcemia status post pamidronate 11/03/2011. Recurrent hypercalcemia 02/16/2012 status post Zometa. The calcium has remained in normal range. 25. Fractured tooth with associated pain. Status post a tooth extraction by Dr. Kristin Bruins, he is to be scheduled for multiple tooth extractions in the near future. We will place further Zometa on hold.  Disposition-[name] appears stable. Plan to continue Velcade 3 weeks out of 4 and weekly Cytoxan/Decadron. He will return for a followup visit in one month. He will contact the office in the interim with any problems.  Plan reviewed with Dr. Truett Perna.  Lonna Cobb ANP/GNP-BC

## 2012-08-03 NOTE — Telephone Encounter (Signed)
Per staff message and POF I have scheduled appts.  JMW  

## 2012-08-03 NOTE — Patient Instructions (Signed)
[facility] Discharge Instructions for Patients Receiving Chemotherapy  Today you received the following chemotherapy agents Velcade.  To help prevent nausea and vomiting after your treatment, we encourage you to take your nausea medication as prescribed.   If you develop nausea and vomiting that is not controlled by your nausea medication, call the clinic. If it is after clinic hours your family physician or the after hours number for the clinic or go to the Emergency Department.   BELOW ARE SYMPTOMS THAT SHOULD BE REPORTED IMMEDIATELY:  *FEVER GREATER THAN 100.5 F  *CHILLS WITH OR WITHOUT FEVER  NAUSEA AND VOMITING THAT IS NOT CONTROLLED WITH YOUR NAUSEA MEDICATION  *UNUSUAL SHORTNESS OF BREATH  *UNUSUAL BRUISING OR BLEEDING  TENDERNESS IN MOUTH AND THROAT WITH OR WITHOUT PRESENCE OF ULCERS  *URINARY PROBLEMS  *BOWEL PROBLEMS  UNUSUAL RASH Items with * indicate a potential emergency and should be followed up as soon as possible.  One of the nurses will contact you 24 hours after your treatment. Please let the nurse know about any problems that you may have experienced. Feel free to call the clinic you have any questions or concerns. The clinic phone number is [phone].   I have been informed and understand all the instructions given to me. I know to contact the clinic, my physician, or go to the Emergency Department if any problems should occur. I do not have any questions at this time, but understand that I may call the clinic during office hours   should I have any questions or need assistance in obtaining follow up care.    __________________________________________  _____________  __________ Signature of Patient or Authorized Representative            Date                   Time    __________________________________________ Nurse's Signature    

## 2012-08-04 ENCOUNTER — Telehealth: Payer: Self-pay | Admitting: Oncology

## 2012-08-04 NOTE — Telephone Encounter (Signed)
Pt has copy of appt calendar for February 2014 labs, chemo and MD from [facility], chemo scheduler

## 2012-08-07 LAB — PROTEIN ELECTROPHORESIS, SERUM
Beta 2: 3 % — ABNORMAL LOW (ref 3.2–6.5)
Gamma Globulin: 26 % — ABNORMAL HIGH (ref 11.1–18.8)

## 2012-08-08 ENCOUNTER — Telehealth: Payer: Self-pay | Admitting: Certified Registered Nurse Anesthetist

## 2012-08-08 DIAGNOSIS — C9000 Multiple myeloma not having achieved remission: Secondary | ICD-10-CM

## 2012-08-08 MED ORDER — DEXAMETHASONE 4 MG PO TABS: 20.0000 mg | ORAL_TABLET | ORAL | Status: DC

## 2012-08-08 NOTE — Telephone Encounter (Signed)
Pt. left message this morning requesting for medication.  Called and spoke to wife Angelique Blonder, pt. requested decadron refill.  Discuss with Dr. Truett Perna, okay for refill and pt. and wife also notified that lab( protein level) has improved per MD.  E-scribe sent. HL

## 2012-08-09 ENCOUNTER — Telehealth: Payer: Self-pay | Admitting: *Deleted

## 2012-08-09 NOTE — Telephone Encounter (Signed)
Message copied by Caleb Popp on Wed Aug 09, 2012 12:44 PM ------      Message from: Thornton Papas B      Created: Mon Aug 07, 2012  9:17 PM       Please call patient, IgG, m-spike are better, cont. Current therapy

## 2012-08-16 ENCOUNTER — Other Ambulatory Visit: Payer: Self-pay | Admitting: Oncology

## 2012-08-16 ENCOUNTER — Encounter: Payer: Self-pay | Admitting: *Deleted

## 2012-08-16 NOTE — Progress Notes (Signed)
Fax from [employer] that Cyclophosphamide shipped 08/15/12 for 08/16/12 delivery.

## 2012-08-17 ENCOUNTER — Other Ambulatory Visit (HOSPITAL_BASED_OUTPATIENT_CLINIC_OR_DEPARTMENT_OTHER): Payer: Medicare Other | Admitting: Lab

## 2012-08-17 ENCOUNTER — Ambulatory Visit (HOSPITAL_BASED_OUTPATIENT_CLINIC_OR_DEPARTMENT_OTHER): Payer: Medicare Other

## 2012-08-17 VITALS — BP 105/67 | HR 87 | Temp 96.9°F | Resp 20

## 2012-08-17 DIAGNOSIS — C9000 Multiple myeloma not having achieved remission: Secondary | ICD-10-CM

## 2012-08-17 DIAGNOSIS — Z5112 Encounter for antineoplastic immunotherapy: Secondary | ICD-10-CM

## 2012-08-17 LAB — CBC WITH DIFFERENTIAL/PLATELET
Basophils Absolute: 0 10*3/uL (ref 0.0–0.1)
Eosinophils Absolute: 0.1 10*3/uL (ref 0.0–0.5)
HCT: 37.8 % — ABNORMAL LOW (ref 38.4–49.9)
LYMPH%: 22.6 % (ref 14.0–49.0)
MCV: 86.1 fL (ref 79.3–98.0)
MONO#: 0.8 10*3/uL (ref 0.1–0.9)
MONO%: 14.9 % — ABNORMAL HIGH (ref 0.0–14.0)
NEUT#: 3.4 10*3/uL (ref 1.5–6.5)
NEUT%: 60.5 % (ref 39.0–75.0)
Platelets: 166 10*3/uL (ref 140–400)
WBC: 5.6 10*3/uL (ref 4.0–10.3)

## 2012-08-17 MED ORDER — DEXAMETHASONE 4 MG PO TABS
40.0000 mg | ORAL_TABLET | Freq: Once | ORAL | Status: AC
  Administered 2012-08-17: 40 mg via ORAL

## 2012-08-17 MED ORDER — ONDANSETRON HCL 8 MG PO TABS
8.0000 mg | ORAL_TABLET | Freq: Once | ORAL | Status: AC
  Administered 2012-08-17: 8 mg via ORAL

## 2012-08-17 MED ORDER — BORTEZOMIB CHEMO SQ INJECTION 3.5 MG (2.5MG/ML)
1.3000 mg/m2 | Freq: Once | INTRAMUSCULAR | Status: AC
  Administered 2012-08-17: 3 mg via SUBCUTANEOUS
  Filled 2012-08-17: qty 3

## 2012-08-17 NOTE — Progress Notes (Signed)
Pt. C/o increased shortness of breath with activity.  Walked him in the hall.  O2 sat fell from 95 to 91 and then back up to 96.  Pulse increased from 87 to 100 and then back to 91.  Discussed with Lonna Cobb NP and she will discuss with Dr. Truett Perna.  But we are to go ahead and treat him today with velcade.  Lonna Cobb NP here to see pt.

## 2012-08-17 NOTE — Patient Instructions (Addendum)
[name] Discharge Instructions for Patients Receiving Chemotherapy  Today you received the following chemotherapy agents velcade  To help prevent nausea and vomiting after your treatment, we encourage you to take your nausea medication.    If you develop nausea and vomiting that is not controlled by your nausea medication, call the clinic. If it is after clinic hours your family physician or the after hours number for the clinic or go to the Emergency Department.   BELOW ARE SYMPTOMS THAT SHOULD BE REPORTED IMMEDIATELY:  *FEVER GREATER THAN 100.5 F  *CHILLS WITH OR WITHOUT FEVER  NAUSEA AND VOMITING THAT IS NOT CONTROLLED WITH YOUR NAUSEA MEDICATION  *UNUSUAL SHORTNESS OF BREATH  *UNUSUAL BRUISING OR BLEEDING  TENDERNESS IN MOUTH AND THROAT WITH OR WITHOUT PRESENCE OF ULCERS  *URINARY PROBLEMS  *BOWEL PROBLEMS  UNUSUAL RASH Items with * indicate a potential emergency and should be followed up as soon as possible. . Feel free to call the clinic you have any questions or concerns. The clinic phone number is [phone].   I have been informed and understand all the instructions given to me. I know to contact the clinic, my physician, or go to the Emergency Department if any problems should occur. I do not have any questions at this time, but understand that I may call the clinic during office hours   should I have any questions or need assistance in obtaining follow up care.    __________________________________________  _____________  __________ Signature of Patient or Authorized Representative            Date                   Time    __________________________________________ Nurse's Signature

## 2012-08-21 ENCOUNTER — Other Ambulatory Visit: Payer: Self-pay | Admitting: Oncology

## 2012-08-24 ENCOUNTER — Other Ambulatory Visit: Payer: Self-pay | Admitting: Lab

## 2012-08-24 ENCOUNTER — Telehealth: Payer: Self-pay | Admitting: *Deleted

## 2012-08-24 ENCOUNTER — Ambulatory Visit: Payer: Self-pay

## 2012-08-24 ENCOUNTER — Other Ambulatory Visit: Payer: Self-pay | Admitting: Oncology

## 2012-08-24 NOTE — Telephone Encounter (Signed)
Asking if he should still take his steroid and cytoxan today since he can't come to office for treatment? Per Dr. Truett Perna, his counts have been stable-OK to take home chemo meds. Can skip this week's Velcade and pick up next week as scheduled.

## 2012-08-24 NOTE — Telephone Encounter (Signed)
Called and spoke with pt; he stated not able to come due to weather and will re-schedule.

## 2012-08-31 ENCOUNTER — Ambulatory Visit (INDEPENDENT_AMBULATORY_CARE_PROVIDER_SITE_OTHER)
Admission: RE | Admit: 2012-08-31 | Discharge: 2012-08-31 | Disposition: A | Discharge status: Home / Self Care | Payer: Medicare Other | Source: Ambulatory Visit | Attending: Internal Medicine | Admitting: Internal Medicine

## 2012-08-31 ENCOUNTER — Ambulatory Visit (HOSPITAL_BASED_OUTPATIENT_CLINIC_OR_DEPARTMENT_OTHER): Payer: Medicare Other

## 2012-08-31 ENCOUNTER — Other Ambulatory Visit: Payer: Medicare Other

## 2012-08-31 ENCOUNTER — Encounter: Payer: Self-pay | Admitting: Internal Medicine

## 2012-08-31 ENCOUNTER — Ambulatory Visit (HOSPITAL_BASED_OUTPATIENT_CLINIC_OR_DEPARTMENT_OTHER): Payer: Medicare Other | Admitting: Oncology

## 2012-08-31 ENCOUNTER — Telehealth: Payer: Self-pay | Admitting: Oncology

## 2012-08-31 ENCOUNTER — Ambulatory Visit (INDEPENDENT_AMBULATORY_CARE_PROVIDER_SITE_OTHER): Payer: Medicare Other | Admitting: Internal Medicine

## 2012-08-31 VITALS — BP 115/62 | HR 86 | Temp 97.0°F | Resp 20 | Ht 69.5 in | Wt 251.6 lb

## 2012-08-31 VITALS — BP 118/76 | HR 89 | Ht 72.0 in | Wt 254.6 lb

## 2012-08-31 DIAGNOSIS — G4733 Obstructive sleep apnea (adult) (pediatric): Secondary | ICD-10-CM

## 2012-08-31 LAB — CBC WITH DIFFERENTIAL/PLATELET
BASO%: 1.3 % (ref 0.0–2.0)
Eosinophils Absolute: 0.1 10*3/uL (ref 0.0–0.5)
LYMPH%: 22 % (ref 14.0–49.0)
MCHC: 34.8 g/dL (ref 32.0–36.0)
MONO#: 0.6 10*3/uL (ref 0.1–0.9)
MONO%: 10.8 % (ref 0.0–14.0)
NEUT#: 3.5 10*3/uL (ref 1.5–6.5)
Platelets: 147 10*3/uL (ref 140–400)
RBC: 4.52 10*6/uL (ref 4.20–5.82)
RDW: 17 % — ABNORMAL HIGH (ref 11.0–14.6)
WBC: 5.3 10*3/uL (ref 4.0–10.3)

## 2012-08-31 LAB — COMPREHENSIVE METABOLIC PANEL (CC13)
ALT: 13 U/L (ref 0–55)
Albumin: 3.1 g/dL — ABNORMAL LOW (ref 3.5–5.0)
CO2: 25 mEq/L (ref 22–29)
Chloride: 105 mEq/L (ref 98–107)
Glucose: 132 mg/dl — ABNORMAL HIGH (ref 70–99)
Potassium: 3.8 mEq/L (ref 3.5–5.1)
Sodium: 138 mEq/L (ref 136–145)
Total Bilirubin: 0.67 mg/dL (ref 0.20–1.20)
Total Protein: 7.1 g/dL (ref 6.4–8.3)

## 2012-08-31 MED ORDER — ONDANSETRON HCL 8 MG PO TABS
8.0000 mg | ORAL_TABLET | Freq: Once | ORAL | Status: AC
  Administered 2012-08-31: 8 mg via ORAL

## 2012-08-31 MED ORDER — DEXAMETHASONE 4 MG PO TABS
40.0000 mg | ORAL_TABLET | Freq: Once | ORAL | Status: AC
  Administered 2012-08-31: 40 mg via ORAL

## 2012-08-31 MED ORDER — BORTEZOMIB CHEMO SQ INJECTION 3.5 MG (2.5MG/ML)
1.3000 mg/m2 | Freq: Once | INTRAMUSCULAR | Status: AC
  Administered 2012-08-31: 3 mg via SUBCUTANEOUS
  Filled 2012-08-31: qty 3

## 2012-08-31 NOTE — Assessment & Plan Note (Signed)
Still some difficulty with CPAP mask seal. Not much room to decrease pressure.  Plan- [facility]AP mask fitting with [facility] staff.

## 2012-08-31 NOTE — Progress Notes (Signed)
[facility]    OFFICE PROGRESS NOTE   INTERVAL HISTORY:   He returns as scheduled. He missed treatment last week secondary to the weather. [name] complains of malaise. He has chronic exertional dyspnea that has increased over the past month. He complains of burning at the left lower leg. No leg swelling. No chest pain. He has night sweats. No peripheral numbness.  Objective:  Vital signs in last 24 hours:  Blood pressure 115/62, pulse 86, temperature 97 F (36.1 C), temperature source Oral, resp. rate 20, height 5' 9.5" (1.765 m), weight 251 lb 9.6 oz (114.125 kg).    HEENT: No thrush or ulcers Resp: Few end inspiratory coarse rales at the bases, no respiratory distress Cardio: Regular rate and rhythm GI: No hepatosplenomegaly Vascular: No leg edema   Lab Results:  Lab Results  Component Value Date   WBC 5.3 08/31/2012   HGB 13.5 08/31/2012   HCT 38.8 08/31/2012   MCV 85.8 08/31/2012   PLT 147 08/31/2012   ANC 3.5  Serum M spike 1.58, IgG 1920 on 08/03/2012  Medications: I have reviewed the patient's current medications.  Assessment/Plan: 1. Multiple myeloma, status post treatment with melphalan/prednisone/thalidomide beginning in November 2009. The thalidomide was increased to 200 mg daily beginning 09/11/2008. The serum M spike was slightly improved on 03/18/2009 and slightly increased on 05/09/2009. He has been maintained off of specific therapy for multiple myeloma since September 2010. The serum M spike was increased on 10/20/2011 at 4.6. He began Revlimid on 11/11/2011. He takes weekly dexamethasone. He began cycle 2 of Revlimid on 12/09/2011. The serum M spike was improved on 12/31/2011. The serum M spike was slightly higher on 01/28/2012. He began cycle 4 on 02/03/2012. He developed recurrent hypercalcemia. Treatment was changed to Cytoxan/Velcade/Decadron beginning 03/02/2012. The serum M spike was lower on 02/25/2012. The serum M spike was stable on  06/09/2012. The serum M spike improved on 08/03/2012  2. Chronic "dizziness." 3. Chest x-ray 11/01/2008 with a patchy opacity at the right midlung suspicious for pneumonia. He was treated with Avelox. 4. Low back pain with a radicular component. An MRI on 10/07/2008 showed no involvement of the lumbosacral spine with myeloma. 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 5. Bilateral neck pain, status post a CT 05/17/2008 with findings of spinal stenosis and osteoarthritis. 6. 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 Red cell microcytosis, likely related to hemoglobin C trait. A ferritin level returned elevated and a stool Hemoccult was negative 01/23/2010. He underwent a colonoscopy in 2009. 8. Elevated beta-2 microglobulin level. 9. History of hypertension. 10. History of a herniated disk at L4-L5 on MRI an 01/11/2002. 11. Hyperlipidemia. 12. Gastroesophageal reflux disease. 13. History of atypical angina. 14. History of rectal bleeding-large external hemorrhoids noted 02/25/2012. The stool was Hemoccult positive. He has a history of colon polyps. He is followed by [facility]. He was diagnosed with hemorrhoids in September of 2013 while hospitalized 15. Superficial venous thrombosis of the greater saphenous vein on a Doppler 09/27/2008. Negative for deep vein thrombosis. Symptoms improved with aspirin. 16. Neck pain with numbness/tingling in the arms, hands, and low back with proximal right leg weakness. An MRI of the cervical spine 05/30/2009 showed multilevel spondylosis with mild cord edema at C4-C5 and C5-C6. He was noted to have an enhancing lesion of the cord at T3-T4 of unclear etiology. In the  lumbar spine there was no change in the spondylosis at L3-L4 and L4-L5. There was no involvement of the cervical or lumbar spine with myeloma. He  is status post C4-C5, C5-C6, and C6-C7 anterior cervical diskectomy with fusion by Dr. Jordan Likes 08/01/2009. 17. History of mild thrombocytopenia. 18. Indeterminate-age deep vein thrombosis of the left lower extremity on a venous Doppler 08/15/2009. There were also findings consistent with superficial thrombosis involving the right lower extremity 08/15/2009. Coumadin was discontinued in October 2011. 19. Type 2 diabetes. 20. History of hematuria, followed at [facility]. Urinalysis was negative for blood on 07/23/2011. 21. Radicular back pain with MRI of the lumbar spine on 10/19/2011 showing nerve root impingement at L2-L3, L3-L4 and L4-L5 with the most severe at the right L3 nerve roots due to a large disc fragment. Associated right leg weakness. He underwent right L2-3 decompressive laminotomy with right L2 and L3 decompressive foraminotomy; right L2-3 microdiscectomy on 11/01/2011. 22. Constipation He continues a laxative regimen. 23. Hospitalization 11/08/2011 through 11/10/2011 with orthostatic hypotension/dizziness. He improved with intravenous hydration. He had mild hypotension when here on 11/18/2011. We recommended discontinuing Norvasc and Proscar. 24. Hypercalcemia status post pamidronate 11/03/2011. Recurrent hypercalcemia 02/16/2012 status post Zometa. The calcium has remained in normal range. 25. Fractured tooth with associated pain. Status post a tooth extraction by Dr. Kristin Bruins, he is to be scheduled for multiple tooth extractions in the near future. Zometa has been placed on hold 26. Exertional dyspnea-etiology unclear. I recommended he followup with Dr. Modesto Charon. He may need a cardiac evaluation.  Disposition:  He continues to tolerate the Cytoxan/Velcade/Decadron well. No clinical evidence for progression of the multiple myeloma. The serum IgG and M spike continue to improve. He continues weekly Cytoxan/Decadron and he is treated with Velcade three out of four weeks. He will begin  another cycle today. Mr. Blazejewski is scheduled for an office visit on 10/05/2012.   Thornton Papas, MD  08/31/2012  1:23 PM

## 2012-08-31 NOTE — Telephone Encounter (Signed)
gv and printed appt schedule for pt for Feb and March...pt awar

## 2012-08-31 NOTE — Patient Instructions (Addendum)
[facility] Discharge Instructions for Patients Receiving Chemotherapy  Today you received the following chemotherapy agents Velcade.  To help prevent nausea and vomiting after your treatment, we encourage you to take your nausea medication as prescribed.   If you develop nausea and vomiting that is not controlled by your nausea medication, call the clinic. If it is after clinic hours your family physician or the after hours number for the clinic or go to the Emergency Department.   BELOW ARE SYMPTOMS THAT SHOULD BE REPORTED IMMEDIATELY:  *FEVER GREATER THAN 100.5 F  *CHILLS WITH OR WITHOUT FEVER  NAUSEA AND VOMITING THAT IS NOT CONTROLLED WITH YOUR NAUSEA MEDICATION  *UNUSUAL SHORTNESS OF BREATH  *UNUSUAL BRUISING OR BLEEDING  TENDERNESS IN MOUTH AND THROAT WITH OR WITHOUT PRESENCE OF ULCERS  *URINARY PROBLEMS  *BOWEL PROBLEMS  UNUSUAL RASH Items with * indicate a potential emergency and should be followed up as soon as possible.  One of the nurses will contact you 24 hours after your treatment. Please let the nurse know about any problems that you may have experienced. Feel free to call the clinic you have any questions or concerns. The clinic phone number is [phone].   I have been informed and understand all the instructions given to me. I know to contact the clinic, my physician, or go to the Emergency Department if any problems should occur. I do not have any questions at this time, but understand that I may call the clinic during office hours   should I have any questions or need assistance in obtaining follow up care.    __________________________________________  _____________  __________ Signature of Patient or Authorized Representative            Date                   Time    __________________________________________ Nurse's Signature    

## 2012-08-31 NOTE — Progress Notes (Signed)
Patient ID: [name], male    DOB: 1936/02/16, 77 y.o.   MRN: [id number]  HPI 03/05/11- 77 year old male former smoker seen because of obstructive sleep apnea on kind referral by Dr. Jacelyn Grip from [facility] in [place]. Wife(?) is here. A diagnostic sleep study on 01/31/2008 was done at [facility] because of insomnia with sleep apnea. This study confirmed moderately severe obstructive sleep apnea with an AHI of 22 per hour. A full face mask became uncomfortable, he got tired of it and stopped using CPAP several months to a year ago. Wife now reports loud snoring and again says he stays sleepy during the day although he has difficulty falling asleep. Bedtime between 8 and 9 PM with sleep latency at least to 30 minutes. He feels that he awakens and will lie awake or get up and wander around in the home  half of each night. He finally gets up around 5 AM. When he is asleep he feels that he is sleeping well but this is quite fragmented. He admits drowsiness during the day if he sits quietly. Occasional cough in the morning. Feels smothered lying supine, better on his sides. Treated for hypertension and elevated cholesterol but he denies heart or lung disease. Denies ENT surgery.  He had smoked heavily but quit 20 years ago.  04/23/11-  77 year old male former smoker seen because of obstructive sleep apnea. A male companion is with him. He has not been able to be compliant with CPAP, blaming pains in the back of his neck and spine related to wearing the CPAP. We discussed how it might be constraining motion until his muscles gets stiff. He wore it for one week out of the two-week auto titration trial. He has an older machine that worked well. He says his mask leak so that was unusable. His home care company would not replace it because he old money. He wants to skip the auto titration, go back to his old machine, change equipment supplier and get a new mask. We discussed CPAP, indications and alternatives,  medical concerns, and the documentation of adequate compliance to meet the Medicare rules. He isn't sure of the pressure setting on his old machine, which was one of the reasons we went for auto titration.  06/04/11- 77 year old male former smoker seen because of obstructive sleep apnea. Wife here. He got a replacement CPAP mask but is using his old machine. [facility] changed his pressure to 10. The current mask does not leak. He complains of insomnia with or without CPAP and that seems to be the biggest problem interfering with a sustained use of CPAP now.  07/30/11- 77 year old male former smoker seen because of obstructive sleep apnea. Wife here He try his old CPAP machine again but complains that humidifier gurgles in the flows even when he tries putting it lower than the level of his head so water runs back into the machine. He tried without she notify her but didn't like that either. Wife doesn't think CPAP stopped his snoring. He asks for a new sleep study which is the documentation we really need. He is sometimes restless at night even if he takes a sleeping pill.  09/02/19- 77 year old male former smoker seen because of obstructive sleep apnea.     NPSG 08/22/11- moderate OSA, AHI 26.8/hr with CPAP titration to 9 cwp for AHI 0.5/ hr. Oxygen was added at 2 L/M due to desaturation, and will need follow-up.  11/29/11- 77 year old male former smoker seen because of obstructive sleep apnea.  Pt hasnt worn mask fo 1 month. pt denies any sob,wheezing, chest congestion He was hospitalized twice for back surgery and I think complication of that surgery. Through that, he dropped off of CPAP. Wife says he still uses it a few hours at a time and it works when he wears it. Control has seemed  good at 9 CWP/ [facility]. Sleep is also disturbed by pain in his knees  02/29/12- 77 year old male former smoker seen because of obstructive sleep apnea.        Wife here  CPAP mask is not working well-unable to get one  until September due to insurance; Trying to wear CPAP every night  for 8-9 hours, but current mask hurts his face.  08/31/12- 77 year old male former smoker seen because of obstructive sleep apnea. FOLLOWS FOR: trying to wear CPAP every night but has hard time with mask around nose and pressure blowing into eyes; also states hard to lay/sleep on back. Leak worse in his preferred R decubitus sleep position. Wife says he snores through at times, but much better with CPAP. He is also concerned about dyspnea, especially with exertion. Dr Truett Perna recently suggested he might need to see a heart doctor. Denies chest pain, cough or wheeze. Apparently not a new problem.  Review of Systems-see HPI Constitutional:   No-   weight loss, night sweats, fevers, chills, +fatigue, lassitude HEENT:   No-  headaches, difficulty swallowing, tooth/dental problems, sore throat,       No-  sneezing, itching, ear ache, nasal congestion, post nasal drip,  CV:  No-   chest pain, orthopnea, PND, swelling in lower extremities, anasarca, dizziness, palpitations Resp: + shortness of breath with exertion or at rest.              No-   productive cough,  No non-productive cough,  No-  coughing up of blood.              No-   change in color of mucus.  No- wheezing.   Skin: No-   rash or lesions. GI:  No-   heartburn, indigestion, abdominal pain, nausea, vomiting,  GU: MS:  No-   joint pain or swelling.   Neuro- normal:  Psych:  No- change in mood or affect. No depression or anxiety.  No memory loss.  Objective:   Physical Exam General- Alert, Oriented, Affect-appropriate, Distress- none acute,  Overweight, calm/laconic but seems intelligent, Using a walker. Skin- rash-none, lesions- none, excoriation- none Lymphadenopathy- none Head- atraumatic            Eyes- Gross vision intact, PERRLA, conjunctivae clear secretions, strabismus            Ears- Hearing, canals-normal            Nose- Clear, no-Septal dev, mucus,  polyps, erosion, perforation             Throat- Mallampati II-III , mucosa clear , drainage- none, tonsils- atrophic Neck- flexible , trachea midline, no stridor , thyroid nl, carotid no bruit Chest - symmetrical excursion , unlabored           Heart/CV- RRR , no murmur , no gallop  , no rub, nl s1 s2                           +JVD 1-2 cm , edema- none, stasis changes- none, varices- none           Lung-  clear,  wheeze- none, cough- none , dullness-none, rub- none           Chest wall-  Abd- Br/ Gen/ Rectal- Not done, not indicated Extrem- cyanosis- none, clubbing, none, atrophy- none, strength- nl Neuro- grossly intact to observation

## 2012-08-31 NOTE — Assessment & Plan Note (Signed)
Lungs sound clear, with some JVD. Agree probably cardiogenic. We can get PFT if needed. Plan- CXR while here. He will follow-up on this problem with Dr Modesto Charon. I will help if needed.

## 2012-08-31 NOTE — Patient Instructions (Addendum)
Continue working with [facility], trying to get a CPAP mask that suits you.  Order- CXR  Dx dyspnea  Order- Refer to [facility] staff for help with CPAP mask fit.  We can continue CPAP 9  Please call as needed

## 2012-09-01 ENCOUNTER — Telehealth: Payer: Self-pay | Admitting: *Deleted

## 2012-09-01 NOTE — Telephone Encounter (Signed)
Per staff message and POF I have scheduled appts.  JMW  

## 2012-09-04 LAB — PROTEIN ELECTROPHORESIS, SERUM
Albumin ELP: 53.6 % — ABNORMAL LOW (ref 55.8–66.1)
Alpha-2-Globulin: 10.1 % (ref 7.1–11.8)
M-Spike, %: 1.58 g/dL
Total Protein, Serum Electrophoresis: 7.1 g/dL (ref 6.0–8.3)

## 2012-09-04 LAB — IGG: IgG (Immunoglobin G), Serum: 1890 mg/dL — ABNORMAL HIGH (ref 650–1600)

## 2012-09-05 ENCOUNTER — Ambulatory Visit (HOSPITAL_BASED_OUTPATIENT_CLINIC_OR_DEPARTMENT_OTHER): Payer: Medicare Other | Attending: Internal Medicine | Admitting: Radiology

## 2012-09-05 ENCOUNTER — Telehealth: Payer: Self-pay | Admitting: Oncology

## 2012-09-05 NOTE — Telephone Encounter (Signed)
Talked to pt and gave her appt for March 2014, lab, chem

## 2012-09-06 ENCOUNTER — Other Ambulatory Visit: Payer: Self-pay | Admitting: *Deleted

## 2012-09-06 MED ORDER — CYCLOPHOSPHAMIDE 50 MG PO TABS: 600.0000 mg | ORAL_TABLET | ORAL | Status: DC

## 2012-09-07 ENCOUNTER — Other Ambulatory Visit: Payer: Self-pay | Admitting: *Deleted

## 2012-09-07 ENCOUNTER — Ambulatory Visit: Payer: Self-pay

## 2012-09-07 ENCOUNTER — Ambulatory Visit (HOSPITAL_BASED_OUTPATIENT_CLINIC_OR_DEPARTMENT_OTHER): Payer: Medicare Other | Admitting: Oncology

## 2012-09-07 ENCOUNTER — Other Ambulatory Visit (HOSPITAL_BASED_OUTPATIENT_CLINIC_OR_DEPARTMENT_OTHER): Payer: Medicare Other

## 2012-09-07 ENCOUNTER — Ambulatory Visit (HOSPITAL_BASED_OUTPATIENT_CLINIC_OR_DEPARTMENT_OTHER): Payer: Medicare Other

## 2012-09-07 VITALS — BP 120/72 | HR 93 | Temp 98.5°F

## 2012-09-07 LAB — CBC WITH DIFFERENTIAL/PLATELET
Basophils Absolute: 0 10*3/uL (ref 0.0–0.1)
Eosinophils Absolute: 0.2 10*3/uL (ref 0.0–0.5)
HGB: 12.7 g/dL — ABNORMAL LOW (ref 13.0–17.1)
MCV: 86.4 fL (ref 79.3–98.0)
MONO#: 1.2 10*3/uL — ABNORMAL HIGH (ref 0.1–0.9)
NEUT#: 5.3 10*3/uL (ref 1.5–6.5)
RBC: 4.27 10*6/uL (ref 4.20–5.82)
RDW: 16.6 % — ABNORMAL HIGH (ref 11.0–14.6)
WBC: 7.8 10*3/uL (ref 4.0–10.3)
lymph#: 1.1 10*3/uL (ref 0.9–3.3)

## 2012-09-07 MED ORDER — ONDANSETRON HCL 8 MG PO TABS
8.0000 mg | ORAL_TABLET | Freq: Once | ORAL | Status: AC
  Administered 2012-09-07: 8 mg via ORAL

## 2012-09-07 MED ORDER — BORTEZOMIB CHEMO SQ INJECTION 3.5 MG (2.5MG/ML)
1.3000 mg/m2 | Freq: Once | INTRAMUSCULAR | Status: AC
  Administered 2012-09-07: 3 mg via SUBCUTANEOUS
  Filled 2012-09-07: qty 3

## 2012-09-07 MED ORDER — DEXAMETHASONE 4 MG PO TABS
40.0000 mg | ORAL_TABLET | Freq: Once | ORAL | Status: AC
  Administered 2012-09-07: 40 mg via ORAL

## 2012-09-07 MED ORDER — AZITHROMYCIN 250 MG PO TABS: ORAL_TABLET | ORAL | Status: DC

## 2012-09-07 NOTE — Patient Instructions (Addendum)
[facility] Discharge Instructions for Patients Receiving Chemotherapy  Today you received the following chemotherapy agents:  Velcade  To help prevent nausea and vomiting after your treatment, we encourage you to take your nausea medication as ordered per MD.    If you develop nausea and vomiting that is not controlled by your nausea medication, call the clinic. If it is after clinic hours your family physician or the after hours number for the clinic or go to the Emergency Department.   BELOW ARE SYMPTOMS THAT SHOULD BE REPORTED IMMEDIATELY:  *FEVER GREATER THAN 100.5 F  *CHILLS WITH OR WITHOUT FEVER  NAUSEA AND VOMITING THAT IS NOT CONTROLLED WITH YOUR NAUSEA MEDICATION  *UNUSUAL SHORTNESS OF BREATH  *UNUSUAL BRUISING OR BLEEDING  TENDERNESS IN MOUTH AND THROAT WITH OR WITHOUT PRESENCE OF ULCERS  *URINARY PROBLEMS  *BOWEL PROBLEMS  UNUSUAL RASH Items with * indicate a potential emergency and should be followed up as soon as possible.   Please let the nurse know about any problems that you may have experienced. Feel free to call the clinic you have any questions or concerns. The clinic phone number is [phone].   I have been informed and understand all the instructions given to me. I know to contact the clinic, my physician, or go to the Emergency Department if any problems should occur. I do not have any questions at this time, but understand that I may call the clinic during office hours   should I have any questions or need assistance in obtaining follow up care.    __________________________________________  _____________  __________ Signature of Patient or Authorized Representative            Date                   Time    __________________________________________ Nurse's Signature    

## 2012-09-07 NOTE — Progress Notes (Signed)
1250-Pt states that he had a nose bleed this past Saturday that lasted approximately 5 minutes.  States that he has had nose bleeds "off and on" since then.  Last nose bleed being this morning that lasted approximately 3 minutes.  Pt states that he has had harsh cough with sputum varying in color from white to green to blood tinged.  Pt.'s wife states that he had a fever this past Sunday and has had none further.  Dr. Truett Perna notified and to infusion room to assess patient.  Z-PAC to be called in for patient per Dr. Truett Perna.  Pt and pt.'s wife instructed to notify MD if bleeding continues or fevers persist.  Pt and pt.'s wife with no questions at this time.

## 2012-09-08 ENCOUNTER — Other Ambulatory Visit: Payer: Self-pay | Admitting: Oncology

## 2012-09-08 NOTE — Progress Notes (Signed)
   [facility]    OFFICE PROGRESS NOTE   INTERVAL HISTORY:   He returns for scheduled Velcade therapy. Mr. Hunley complains of a cough since I saw him last week. He saw Dr. Modesto Charon and a chest x-ray was negative. He had a fever over the weekend and reports an episode of coughing up blood this morning. He believes the blood was from postnasal drainage. Stable exertional dyspnea. He reports Dr. Modesto Charon referred him to cardiology for evaluation of the dyspnea.  Objective:  Vital signs in last 24 hours:  There were no vitals taken for this visit.   Resp: Lungs clear bilaterally in the anterior and posterior lung fields, no respiratory distress Cardio: Regular rate and rhythm Vascular: No leg edema  Lab Results:  Lab Results  Component Value Date   WBC 7.8 09/07/2012   HGB 12.7* 09/07/2012   HCT 36.9* 09/07/2012   MCV 86.4 09/07/2012   PLT 123* 09/07/2012   ANC 5.3  Chest x-ray 08/31/2012-negative  Medications: I have reviewed the patient's current medications.  Assessment/Plan:  Mr. Sires has multiple myeloma. He is currently being treated with Cytoxan/Velcade/Decadron. He reports a cough, fever, and night sweats. His symptoms have persisted over the past week. A chest x-ray did not show pneumonia. I suspect he has bronchitis. He is immunocompromised with the chemotherapy and underlying multiple myeloma. We prescribed azithromycin.  Disposition:  Mr. Anschutz will complete a course of azithromycin. He will contact us if his symptoms are not improved. He will return for treatment as scheduled on 09/14/2012.   Thornton Papas, MD  09/08/2012  4:54 PM

## 2012-09-11 NOTE — Progress Notes (Signed)
Quick Note:  Pt aware of results. ______ 

## 2012-09-12 ENCOUNTER — Other Ambulatory Visit: Payer: Self-pay | Admitting: Oncology

## 2012-09-12 NOTE — Telephone Encounter (Signed)
[facility] faxed confirmation of Cyclophosphamide prescription shipment.  Shipped on 09-11-2012 with next business day delivery.

## 2012-09-13 ENCOUNTER — Other Ambulatory Visit: Payer: Self-pay | Admitting: *Deleted

## 2012-09-14 ENCOUNTER — Other Ambulatory Visit (HOSPITAL_BASED_OUTPATIENT_CLINIC_OR_DEPARTMENT_OTHER): Payer: Medicare Other | Admitting: Lab

## 2012-09-14 ENCOUNTER — Other Ambulatory Visit: Payer: Self-pay | Admitting: Oncology

## 2012-09-14 ENCOUNTER — Other Ambulatory Visit: Payer: Self-pay | Admitting: Lab

## 2012-09-14 ENCOUNTER — Ambulatory Visit: Payer: Self-pay

## 2012-09-14 ENCOUNTER — Ambulatory Visit (HOSPITAL_BASED_OUTPATIENT_CLINIC_OR_DEPARTMENT_OTHER): Payer: Medicare Other

## 2012-09-14 VITALS — BP 152/84 | HR 85 | Temp 98.0°F | Resp 20

## 2012-09-14 DIAGNOSIS — C9000 Multiple myeloma not having achieved remission: Secondary | ICD-10-CM

## 2012-09-14 DIAGNOSIS — Z5112 Encounter for antineoplastic immunotherapy: Secondary | ICD-10-CM

## 2012-09-14 LAB — CBC WITH DIFFERENTIAL/PLATELET
BASO%: 0.8 % (ref 0.0–2.0)
Basophils Absolute: 0 10*3/uL (ref 0.0–0.1)
EOS%: 1.8 % (ref 0.0–7.0)
HGB: 12.9 g/dL — ABNORMAL LOW (ref 13.0–17.1)
MCH: 30 pg (ref 27.2–33.4)
MCHC: 35.3 g/dL (ref 32.0–36.0)
MCV: 84.9 fL (ref 79.3–98.0)
MONO%: 15.1 % — ABNORMAL HIGH (ref 0.0–14.0)
NEUT%: 60.7 % (ref 39.0–75.0)
RDW: 16.7 % — ABNORMAL HIGH (ref 11.0–14.6)

## 2012-09-14 MED ORDER — BORTEZOMIB CHEMO SQ INJECTION 3.5 MG (2.5MG/ML)
1.3000 mg/m2 | Freq: Once | INTRAMUSCULAR | Status: AC
  Administered 2012-09-14: 3 mg via SUBCUTANEOUS
  Filled 2012-09-14: qty 3

## 2012-09-14 MED ORDER — ONDANSETRON HCL 8 MG PO TABS
8.0000 mg | ORAL_TABLET | Freq: Once | ORAL | Status: AC
  Administered 2012-09-14: 8 mg via ORAL

## 2012-09-14 MED ORDER — DEXAMETHASONE 4 MG PO TABS
40.0000 mg | ORAL_TABLET | Freq: Once | ORAL | Status: AC
  Administered 2012-09-14: 40 mg via ORAL

## 2012-09-14 NOTE — Patient Instructions (Addendum)
[facility] Discharge Instructions for Patients Receiving Chemotherapy  Today you received the following chemotherapy agents :  Velcade.  To help prevent nausea and vomiting after your treatment, we encourage you to take your nausea medication as instructed by your physician.    If you develop nausea and vomiting that is not controlled by your nausea medication, call the clinic. If it is after clinic hours your family physician or the after hours number for the clinic or go to the Emergency Department.   BELOW ARE SYMPTOMS THAT SHOULD BE REPORTED IMMEDIATELY:  *FEVER GREATER THAN 100.5 F  *CHILLS WITH OR WITHOUT FEVER  NAUSEA AND VOMITING THAT IS NOT CONTROLLED WITH YOUR NAUSEA MEDICATION  *UNUSUAL SHORTNESS OF BREATH  *UNUSUAL BRUISING OR BLEEDING  TENDERNESS IN MOUTH AND THROAT WITH OR WITHOUT PRESENCE OF ULCERS  *URINARY PROBLEMS  *BOWEL PROBLEMS  UNUSUAL RASH Items with * indicate a potential emergency and should be followed up as soon as possible.  One of the nurses will contact you 24 hours after your treatment. Please let the nurse know about any problems that you may have experienced. Feel free to call the clinic you have any questions or concerns. The clinic phone number is [phone].   I have been informed and understand all the instructions given to me. I know to contact the clinic, my physician, or go to the Emergency Department if any problems should occur. I do not have any questions at this time, but understand that I may call the clinic during office hours   should I have any questions or need assistance in obtaining follow up care.    __________________________________________  _____________  __________ Signature of Patient or Authorized Representative            Date                   Time    __________________________________________ Nurse's Signature    

## 2012-09-19 ENCOUNTER — Encounter: Payer: Self-pay | Admitting: Cardiology

## 2012-09-24 ENCOUNTER — Other Ambulatory Visit: Payer: Self-pay | Admitting: Oncology

## 2012-09-28 ENCOUNTER — Other Ambulatory Visit: Payer: Self-pay | Admitting: Lab

## 2012-09-28 ENCOUNTER — Other Ambulatory Visit (HOSPITAL_BASED_OUTPATIENT_CLINIC_OR_DEPARTMENT_OTHER): Payer: Medicare Other | Admitting: Lab

## 2012-09-28 ENCOUNTER — Ambulatory Visit (HOSPITAL_BASED_OUTPATIENT_CLINIC_OR_DEPARTMENT_OTHER): Payer: Medicare Other

## 2012-09-28 ENCOUNTER — Ambulatory Visit: Payer: Self-pay

## 2012-09-28 VITALS — BP 109/63 | HR 81 | Temp 97.0°F

## 2012-09-28 DIAGNOSIS — C9000 Multiple myeloma not having achieved remission: Secondary | ICD-10-CM

## 2012-09-28 DIAGNOSIS — Z5112 Encounter for antineoplastic immunotherapy: Secondary | ICD-10-CM

## 2012-09-28 LAB — CBC WITH DIFFERENTIAL/PLATELET
BASO%: 0.5 % (ref 0.0–2.0)
Basophils Absolute: 0 10*3/uL (ref 0.0–0.1)
Eosinophils Absolute: 0.1 10*3/uL (ref 0.0–0.5)
HCT: 36.3 % — ABNORMAL LOW (ref 38.4–49.9)
HGB: 13.1 g/dL (ref 13.0–17.1)
LYMPH%: 31.7 % (ref 14.0–49.0)
MCHC: 36.1 g/dL — ABNORMAL HIGH (ref 32.0–36.0)
MONO#: 0.5 10*3/uL (ref 0.1–0.9)
NEUT#: 2.4 10*3/uL (ref 1.5–6.5)
NEUT%: 55.1 % (ref 39.0–75.0)
Platelets: 172 10*3/uL (ref 140–400)
WBC: 4.3 10*3/uL (ref 4.0–10.3)
lymph#: 1.4 10*3/uL (ref 0.9–3.3)

## 2012-09-28 MED ORDER — DEXAMETHASONE 4 MG PO TABS
40.0000 mg | ORAL_TABLET | Freq: Once | ORAL | Status: AC
  Administered 2012-09-28: 40 mg via ORAL

## 2012-09-28 MED ORDER — ONDANSETRON HCL 8 MG PO TABS
8.0000 mg | ORAL_TABLET | Freq: Once | ORAL | Status: AC
  Administered 2012-09-28: 8 mg via ORAL

## 2012-09-28 MED ORDER — BORTEZOMIB CHEMO SQ INJECTION 3.5 MG (2.5MG/ML)
1.3000 mg/m2 | Freq: Once | INTRAMUSCULAR | Status: AC
  Administered 2012-09-28: 3 mg via SUBCUTANEOUS
  Filled 2012-09-28: qty 3

## 2012-09-28 NOTE — Patient Instructions (Signed)
[facility] Discharge Instructions for Patients Receiving Chemotherapy  Today you received the following chemotherapy agents: velcade  To help prevent nausea and vomiting after your treatment, we encourage you to take your nausea medication.  Take it as often as prescribed.     If you develop nausea and vomiting that is not controlled by your nausea medication, call the clinic. If it is after clinic hours your family physician or the after hours number for the clinic or go to the Emergency Department.   BELOW ARE SYMPTOMS THAT SHOULD BE REPORTED IMMEDIATELY:  *FEVER GREATER THAN 100.5 F  *CHILLS WITH OR WITHOUT FEVER  NAUSEA AND VOMITING THAT IS NOT CONTROLLED WITH YOUR NAUSEA MEDICATION  *UNUSUAL SHORTNESS OF BREATH  *UNUSUAL BRUISING OR BLEEDING  TENDERNESS IN MOUTH AND THROAT WITH OR WITHOUT PRESENCE OF ULCERS  *URINARY PROBLEMS  *BOWEL PROBLEMS  UNUSUAL RASH Items with * indicate a potential emergency and should be followed up as soon as possible.  Feel free to call the clinic you have any questions or concerns. The clinic phone number is [phone].   I have been informed and understand all the instructions given to me. I know to contact the clinic, my physician, or go to the Emergency Department if any problems should occur. I do not have any questions at this time, but understand that I may call the clinic during office hours   should I have any questions or need assistance in obtaining follow up care.    __________________________________________  _____________  __________ Signature of Patient or Authorized Representative            Date                   Time    __________________________________________ Nurse's Signature    

## 2012-10-04 ENCOUNTER — Other Ambulatory Visit: Payer: Self-pay | Admitting: *Deleted

## 2012-10-04 NOTE — Telephone Encounter (Signed)
Refill request from [facility] for Cyclophosphamide given to Dr Kalman Drape nurse. Patient has appt on 10/05/12.

## 2012-10-05 ENCOUNTER — Ambulatory Visit (HOSPITAL_BASED_OUTPATIENT_CLINIC_OR_DEPARTMENT_OTHER): Payer: Medicare Other

## 2012-10-05 ENCOUNTER — Ambulatory Visit (HOSPITAL_BASED_OUTPATIENT_CLINIC_OR_DEPARTMENT_OTHER): Payer: Medicare Other | Admitting: Nurse Practitioner

## 2012-10-05 ENCOUNTER — Ambulatory Visit: Payer: Self-pay

## 2012-10-05 ENCOUNTER — Other Ambulatory Visit (HOSPITAL_BASED_OUTPATIENT_CLINIC_OR_DEPARTMENT_OTHER): Payer: Medicare Other | Admitting: Lab

## 2012-10-05 ENCOUNTER — Telehealth: Payer: Self-pay | Admitting: *Deleted

## 2012-10-05 ENCOUNTER — Telehealth: Payer: Self-pay | Admitting: Oncology

## 2012-10-05 VITALS — BP 122/74 | HR 85 | Temp 97.3°F | Resp 20 | Ht 72.0 in | Wt 254.8 lb

## 2012-10-05 DIAGNOSIS — C9000 Multiple myeloma not having achieved remission: Secondary | ICD-10-CM

## 2012-10-05 DIAGNOSIS — Z5112 Encounter for antineoplastic immunotherapy: Secondary | ICD-10-CM

## 2012-10-05 DIAGNOSIS — R209 Unspecified disturbances of skin sensation: Secondary | ICD-10-CM

## 2012-10-05 DIAGNOSIS — R0989 Other specified symptoms and signs involving the circulatory and respiratory systems: Secondary | ICD-10-CM

## 2012-10-05 DIAGNOSIS — E119 Type 2 diabetes mellitus without complications: Secondary | ICD-10-CM

## 2012-10-05 LAB — CBC WITH DIFFERENTIAL/PLATELET
Basophils Absolute: 0 10*3/uL (ref 0.0–0.1)
EOS%: 1.8 % (ref 0.0–7.0)
HCT: 36.5 % — ABNORMAL LOW (ref 38.4–49.9)
HGB: 12.5 g/dL — ABNORMAL LOW (ref 13.0–17.1)
LYMPH%: 22.9 % (ref 14.0–49.0)
MCH: 29.6 pg (ref 27.2–33.4)
MCHC: 34.1 g/dL (ref 32.0–36.0)
NEUT%: 58.7 % (ref 39.0–75.0)
Platelets: 116 10*3/uL — ABNORMAL LOW (ref 140–400)
lymph#: 1 10*3/uL (ref 0.9–3.3)

## 2012-10-05 LAB — BASIC METABOLIC PANEL (CC13)
CO2: 25 mEq/L (ref 22–29)
Chloride: 106 mEq/L (ref 98–107)
Potassium: 3.9 mEq/L (ref 3.5–5.1)

## 2012-10-05 MED ORDER — DEXAMETHASONE 4 MG PO TABS
40.0000 mg | ORAL_TABLET | Freq: Once | ORAL | Status: AC
  Administered 2012-10-05: 40 mg via ORAL

## 2012-10-05 MED ORDER — BORTEZOMIB CHEMO SQ INJECTION 3.5 MG (2.5MG/ML)
1.3000 mg/m2 | Freq: Once | INTRAMUSCULAR | Status: AC
  Administered 2012-10-05: 3 mg via SUBCUTANEOUS
  Filled 2012-10-05: qty 3

## 2012-10-05 MED ORDER — DEXAMETHASONE 4 MG PO TABS: 20.0000 mg | ORAL_TABLET | ORAL | Status: DC

## 2012-10-05 MED ORDER — ONDANSETRON HCL 8 MG PO TABS
8.0000 mg | ORAL_TABLET | Freq: Once | ORAL | Status: AC
  Administered 2012-10-05: 8 mg via ORAL

## 2012-10-05 NOTE — Telephone Encounter (Signed)
Per staff message and POF I have scheduled appts.  JMW  

## 2012-10-05 NOTE — Patient Instructions (Addendum)
[facility] Discharge Instructions for Patients Receiving Chemotherapy  Today you received the following chemotherapy agents VELCADE To help prevent nausea and vomiting after your treatment, we encourage you to take your nausea medication   Take it as often as prescribed.   If you develop nausea and vomiting that is not controlled by your nausea medication, call the clinic. If it is after clinic hours your family physician or the after hours number for the clinic or go to the Emergency Department.   BELOW ARE SYMPTOMS THAT SHOULD BE REPORTED IMMEDIATELY:  *FEVER GREATER THAN 100.5 F  *CHILLS WITH OR WITHOUT FEVER  NAUSEA AND VOMITING THAT IS NOT CONTROLLED WITH YOUR NAUSEA MEDICATION  *UNUSUAL SHORTNESS OF BREATH  *UNUSUAL BRUISING OR BLEEDING  TENDERNESS IN MOUTH AND THROAT WITH OR WITHOUT PRESENCE OF ULCERS  *URINARY PROBLEMS  *BOWEL PROBLEMS  UNUSUAL RASH Items with * indicate a potential emergency and should be followed up as soon as possible.  If this is your first treatment one of the nurses will contact you 24 hours after your treatment. Please let the nurse know about any problems that you may have experienced. Feel free to call the clinic you have any questions or concerns. The clinic phone number is [phone].   I have been informed and understand all the instructions given to me. I know to contact the clinic, my physician, or go to the Emergency Department if any problems should occur. I do not have any questions at this time, but understand that I may call the clinic during office hours   should I have any questions or need assistance in obtaining follow up care.    __________________________________________  _____________  __________ Signature of Patient or Authorized Representative            Date                   Time    __________________________________________ Nurse's Signature     

## 2012-10-05 NOTE — Progress Notes (Signed)
OFFICE PROGRESS NOTE  Interval history:  [name] returns as scheduled. He continues Cytoxan/Velcade/dexamethasone therapy. Most recent IgG level was stable at 1890 on 08/31/2012 for disease 1910 on 08/03/2012). Most recent SPEP was stable at 1.58 on 08/31/2012.  He denies nausea/vomiting. No mouth sores. No diarrhea. Bowels move regularly with the aid of a laxative. The cough he was experiencing in late February has improved. He continues to expectorate a small amount of phlegm periodically. He has stable dyspnea on exertion. No fever. He intermittently notes a "burning sensation" at the fingertips of the right hand. He has noted this since his neck surgery.   Objective: Blood pressure 122/74, pulse 85, temperature 97.3 F (36.3 C), temperature source Oral, resp. rate 20, height 6' (1.829 m), weight 254 lb 12.8 oz (115.577 kg).  Oropharynx is without thrush or ulceration. Lungs are clear. Regular cardiac rhythm. Abdomen soft and nontender. No organomegaly. Extremities are without edema. Vibratory sense is mildly decreased over the fingertips bilaterally.  Lab Results: Lab Results  Component Value Date   WBC 4.6 10/05/2012   HGB 12.5* 10/05/2012   HCT 36.5* 10/05/2012   MCV 86.7 10/05/2012   PLT 116* 10/05/2012    Chemistry:    Chemistry      Component Value Date/Time   NA 138 10/05/2012 1119   NA 134* 03/21/2012 0430   K 3.9 10/05/2012 1119   K 3.6 03/21/2012 0430   CL 106 10/05/2012 1119   CL 102 03/21/2012 0430   CO2 25 10/05/2012 1119   CO2 27 03/21/2012 0430   BUN 9.9 10/05/2012 1119   BUN 5* 03/21/2012 0430   CREATININE 0.8 10/05/2012 1119   CREATININE 0.71 03/21/2012 0430      Component Value Date/Time   CALCIUM 8.8 10/05/2012 1119   CALCIUM 8.3* 03/21/2012 0430   ALKPHOS 51 08/31/2012 1035   ALKPHOS 66 03/20/2012 0135   AST 20 08/31/2012 1035   AST 14 03/20/2012 0135   ALT 13 08/31/2012 1035   ALT 15 03/20/2012 0135   BILITOT 0.67 08/31/2012 1035   BILITOT 0.2* 03/20/2012 0135        Studies/Results: No results found.  Medications: I have reviewed the patient's current medications.  Assessment/Plan:  1. Multiple myeloma, status post treatment with melphalan/prednisone/thalidomide beginning in November 2009. The thalidomide was increased to 200 mg daily beginning 09/11/2008. The serum M spike was slightly improved on 03/18/2009 and slightly increased on 05/09/2009. He has been maintained off of specific therapy for multiple myeloma since September 2010. The serum M spike was increased on 10/20/2011 at 4.6. He began Revlimid on 11/11/2011. He takes weekly dexamethasone. He began cycle 2 of Revlimid on 12/09/2011. The serum M spike was improved on 12/31/2011. The serum M spike was slightly higher on 01/28/2012. He began cycle 4 on 02/03/2012. He developed recurrent hypercalcemia. Treatment was changed to Cytoxan/Velcade/Decadron beginning 03/02/2012. The serum M spike was lower on 02/25/2012. The serum M spike was stable on 06/09/2012. The serum M spike improved on 08/03/2012 and stable on 08/31/2012. The IgG level was stable on 08/31/2012. 2. Chronic "dizziness." 3. Chest x-ray 11/01/2008 with a patchy opacity at the right midlung suspicious for pneumonia. He was treated with Avelox. 4. Low back pain with a radicular component. An MRI on 10/07/2008 showed no involvement of the lumbosacral spine with myeloma. 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 5. Bilateral neck pain, status post a CT 05/17/2008 with findings  of spinal stenosis and osteoarthritis. 6. 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 Red cell microcytosis, likely related to hemoglobin C trait. A ferritin level returned elevated and a stool Hemoccult was negative 01/23/2010. He underwent a colonoscopy in 2009. 8. Elevated beta-2 microglobulin  level. 9. History of hypertension. 10. History of a herniated disk at L4-L5 on MRI an 01/11/2002. 11. Hyperlipidemia. 12. Gastroesophageal reflux disease. 13. History of atypical angina. 14. History of rectal bleeding-large external hemorrhoids noted 02/25/2012. The stool was Hemoccult positive. He has a history of colon polyps. He is followed by [facility]. He was diagnosed with hemorrhoids in September of 2013 while hospitalized. 15. Superficial venous thrombosis of the greater saphenous vein on a Doppler 09/27/2008. Negative for deep vein thrombosis. Symptoms improved with aspirin. 16. Neck pain with numbness/tingling in the arms, hands, and low back with proximal right leg weakness. An MRI of the cervical spine 05/30/2009 showed multilevel spondylosis with mild cord edema at C4-C5 and C5-C6. He was noted to have an enhancing lesion of the cord at T3-T4 of unclear etiology. In the lumbar spine there was no change in the spondylosis at L3-L4 and L4-L5. There was no involvement of the cervical or lumbar spine with myeloma. He is status post C4-C5, C5-C6, and C6-C7 anterior cervical diskectomy with fusion by Dr. Jordan Likes 08/01/2009. 17. History of mild thrombocytopenia. 18. Indeterminate-age deep vein thrombosis of the left lower extremity on a venous Doppler 08/15/2009. There were also findings consistent with superficial thrombosis involving the right lower extremity 08/15/2009. Coumadin was discontinued in October 2011. 19. Type 2 diabetes. 20. History of hematuria, followed at [facility]. Urinalysis was negative for blood on 07/23/2011. 21. Radicular back pain with MRI of the lumbar spine on 10/19/2011 showing nerve root impingement at L2-L3, L3-L4 and L4-L5 with the most severe at the right L3 nerve roots due to a large disc fragment. Associated right leg weakness. He underwent right L2-3 decompressive laminotomy with right L2 and L3 decompressive foraminotomy; right L2-3 microdiscectomy on  11/01/2011. 22. Constipation He continues a laxative regimen. 23. Hospitalization 11/08/2011 through 11/10/2011 with orthostatic hypotension/dizziness. He improved with intravenous hydration. He had mild hypotension when here on 11/18/2011. We recommended discontinuing Norvasc and Proscar. 24. Hypercalcemia status post pamidronate 11/03/2011. Recurrent hypercalcemia 02/16/2012 status post Zometa. The calcium has remained in normal range. 25. Fractured tooth with associated pain. Status post a tooth extraction by Dr. Kristin Bruins, he is to be scheduled for multiple tooth extractions in the near future. Zometa has been placed on hold 26. Exertional dyspnea-etiology unclear. Stable. He reports an upcoming appointment with cardiology.  Disposition-[name] appears stable. Plan to continue Cytoxan/Velcade/Decadron. He will complete the current cycle today. He will return to begin the next cycle on 10/19/2012. We will see him in followup in one month.  Lonna Cobb ANP/GNP-BC

## 2012-10-06 ENCOUNTER — Other Ambulatory Visit: Payer: Self-pay | Admitting: Certified Registered Nurse Anesthetist

## 2012-10-06 ENCOUNTER — Other Ambulatory Visit: Payer: Self-pay

## 2012-10-06 MED ORDER — OMEPRAZOLE 20 MG PO CPDR: 20.0000 mg | DELAYED_RELEASE_CAPSULE | Freq: Every day | ORAL | Status: DC

## 2012-10-06 MED ORDER — TAMSULOSIN HCL 0.4 MG PO CAPS: 0.4000 mg | ORAL_CAPSULE | Freq: Every day | ORAL | Status: DC

## 2012-10-09 LAB — PROTEIN ELECTROPHORESIS, SERUM
Albumin ELP: 55.8 % (ref 55.8–66.1)
Alpha-1-Globulin: 3.7 % (ref 2.9–4.9)
Beta 2: 3.1 % — ABNORMAL LOW (ref 3.2–6.5)
Beta Globulin: 5.1 % (ref 4.7–7.2)
Gamma Globulin: 22.9 % — ABNORMAL HIGH (ref 11.1–18.8)

## 2012-10-10 NOTE — Telephone Encounter (Signed)
RECEIVED A FAX FROM [facility]CERNING A CONFIRMATION OF PRESCRIPTION SHIPMENT FOR CYCLOPHOSPHAMIDE ON 10/09/12.

## 2012-10-12 ENCOUNTER — Other Ambulatory Visit: Payer: Self-pay | Admitting: *Deleted

## 2012-10-12 DIAGNOSIS — C9000 Multiple myeloma not having achieved remission: Secondary | ICD-10-CM

## 2012-10-12 MED ORDER — AMLODIPINE BESYLATE 5 MG PO TABS: 5.0000 mg | ORAL_TABLET | Freq: Every day | ORAL | Status: DC

## 2012-10-12 NOTE — Telephone Encounter (Signed)
Okay to restart 

## 2012-10-12 NOTE — Telephone Encounter (Signed)
Received refill for amlodipine, on med list it says to hold?

## 2012-10-13 ENCOUNTER — Other Ambulatory Visit: Payer: Self-pay | Admitting: *Deleted

## 2012-10-13 MED ORDER — ATORVASTATIN CALCIUM 10 MG PO TABS: 10.0000 mg | ORAL_TABLET | Freq: Every day | ORAL | Status: DC

## 2012-10-13 NOTE — Telephone Encounter (Signed)
Chart has lipitor 20mg .Will send back for review. thanks

## 2012-10-15 ENCOUNTER — Other Ambulatory Visit: Payer: Self-pay | Admitting: Oncology

## 2012-10-18 ENCOUNTER — Ambulatory Visit (INDEPENDENT_AMBULATORY_CARE_PROVIDER_SITE_OTHER): Payer: Medicare Other | Admitting: Family Medicine

## 2012-10-18 DIAGNOSIS — G4733 Obstructive sleep apnea (adult) (pediatric): Secondary | ICD-10-CM

## 2012-10-18 DIAGNOSIS — C9000 Multiple myeloma not having achieved remission: Secondary | ICD-10-CM

## 2012-10-18 DIAGNOSIS — M5126 Other intervertebral disc displacement, lumbar region: Secondary | ICD-10-CM

## 2012-10-18 DIAGNOSIS — N289 Disorder of kidney and ureter, unspecified: Secondary | ICD-10-CM

## 2012-10-18 NOTE — Progress Notes (Deleted)
Subjective:     Patient ID: [name], male   DOB: [birth year], 77 y.o.   MRN: [id number]  HPI    PMH/PSH: reviewed/updated in Epic  SH/FH: reviewed/updated in Epic  Allergies: reviewed/updated in Epic  Medications: reviewed/updated in Epic  Immunizations: reviewed/updated in Epic    Review of Systems     Objective:   Physical Exam On examination he appeared in good health and spirits. No distress. Anicteric, Acyanotic Vital signs as documented.  Skin warm and dry and without overt rashes.  Head,Ears,Eyes,Throat: normal Neck without JVD.  Lungs clear.  Heart exam notable for regular rhythm, normal sounds and absence of murmurs, rubs or gallops. Abdomen unremarkable and without evidence of organomegaly, masses, or abdominal aortic enlargement. GU: Extremities nonedematous. Neurologic:nonfocal    Assessment:     ***    Plan:     ***

## 2012-10-19 ENCOUNTER — Other Ambulatory Visit (HOSPITAL_BASED_OUTPATIENT_CLINIC_OR_DEPARTMENT_OTHER): Payer: Medicare Other | Admitting: Lab

## 2012-10-19 ENCOUNTER — Ambulatory Visit: Payer: Self-pay | Admitting: Cardiology

## 2012-10-19 ENCOUNTER — Ambulatory Visit (HOSPITAL_BASED_OUTPATIENT_CLINIC_OR_DEPARTMENT_OTHER): Payer: Medicare Other

## 2012-10-19 VITALS — BP 134/83 | HR 88 | Temp 97.0°F

## 2012-10-19 DIAGNOSIS — Z5112 Encounter for antineoplastic immunotherapy: Secondary | ICD-10-CM

## 2012-10-19 DIAGNOSIS — C9000 Multiple myeloma not having achieved remission: Secondary | ICD-10-CM

## 2012-10-19 LAB — CBC WITH DIFFERENTIAL/PLATELET
Eosinophils Absolute: 0 10*3/uL (ref 0.0–0.5)
LYMPH%: 23.6 % (ref 14.0–49.0)
MCH: 29.3 pg (ref 27.2–33.4)
MCHC: 35.8 g/dL (ref 32.0–36.0)
MCV: 81.8 fL (ref 79.3–98.0)
MONO%: 10.8 % (ref 0.0–14.0)
NEUT#: 3 10*3/uL (ref 1.5–6.5)
Platelets: 172 10*3/uL (ref 140–400)
RBC: 4.34 10*6/uL (ref 4.20–5.82)

## 2012-10-19 MED ORDER — ONDANSETRON HCL 8 MG PO TABS
8.0000 mg | ORAL_TABLET | Freq: Once | ORAL | Status: AC
  Administered 2012-10-19: 8 mg via ORAL

## 2012-10-19 MED ORDER — DEXAMETHASONE 4 MG PO TABS
40.0000 mg | ORAL_TABLET | Freq: Once | ORAL | Status: AC
  Administered 2012-10-19: 40 mg via ORAL

## 2012-10-19 MED ORDER — BORTEZOMIB CHEMO SQ INJECTION 3.5 MG (2.5MG/ML)
1.3000 mg/m2 | Freq: Once | INTRAMUSCULAR | Status: AC
  Administered 2012-10-19: 3 mg via SUBCUTANEOUS
  Filled 2012-10-19: qty 3

## 2012-10-19 NOTE — Patient Instructions (Addendum)
[facility] Discharge Instructions for Patients Receiving Chemotherapy  Today you received the following chemotherapy agents Velcade.  To help prevent nausea and vomiting after your treatment, we encourage you to take your nausea medication as prescribed.   If you develop nausea and vomiting that is not controlled by your nausea medication, call the clinic. If it is after clinic hours your family physician or the after hours number for the clinic or go to the Emergency Department.   BELOW ARE SYMPTOMS THAT SHOULD BE REPORTED IMMEDIATELY:  *FEVER GREATER THAN 100.5 F  *CHILLS WITH OR WITHOUT FEVER  NAUSEA AND VOMITING THAT IS NOT CONTROLLED WITH YOUR NAUSEA MEDICATION  *UNUSUAL SHORTNESS OF BREATH  *UNUSUAL BRUISING OR BLEEDING  TENDERNESS IN MOUTH AND THROAT WITH OR WITHOUT PRESENCE OF ULCERS  *URINARY PROBLEMS  *BOWEL PROBLEMS  UNUSUAL RASH Items with * indicate a potential emergency and should be followed up as soon as possible.  Feel free to call the clinic you have any questions or concerns. The clinic phone number is [phone].   I have been informed and understand all the instructions given to me. I know to contact the clinic, my physician, or go to the Emergency Department if any problems should occur. I do not have any questions at this time, but understand that I may call the clinic during office hours   should I have any questions or need assistance in obtaining follow up care.    __________________________________________  _____________  __________ Signature of Patient or Authorized Representative            Date                   Time    __________________________________________ Nurse's Signature    

## 2012-10-19 NOTE — Progress Notes (Signed)
Patient ID: [name], male   DOB: 1936-03-17, 77 y.o.   MRN: [id number] No show.

## 2012-10-24 ENCOUNTER — Other Ambulatory Visit: Payer: Self-pay | Admitting: Oncology

## 2012-10-26 ENCOUNTER — Other Ambulatory Visit: Payer: Self-pay | Admitting: Oncology

## 2012-10-26 ENCOUNTER — Ambulatory Visit (HOSPITAL_BASED_OUTPATIENT_CLINIC_OR_DEPARTMENT_OTHER): Payer: Medicare Other

## 2012-10-26 ENCOUNTER — Other Ambulatory Visit (HOSPITAL_BASED_OUTPATIENT_CLINIC_OR_DEPARTMENT_OTHER): Payer: Medicare Other | Admitting: Lab

## 2012-10-26 VITALS — BP 133/73 | HR 82 | Temp 97.8°F | Resp 18

## 2012-10-26 DIAGNOSIS — C9000 Multiple myeloma not having achieved remission: Secondary | ICD-10-CM

## 2012-10-26 DIAGNOSIS — Z5112 Encounter for antineoplastic immunotherapy: Secondary | ICD-10-CM

## 2012-10-26 LAB — CBC WITH DIFFERENTIAL/PLATELET
BASO%: 0.7 % (ref 0.0–2.0)
EOS%: 1.5 % (ref 0.0–7.0)
HCT: 36.2 % — ABNORMAL LOW (ref 38.4–49.9)
LYMPH%: 24.7 % (ref 14.0–49.0)
MCH: 30 pg (ref 27.2–33.4)
MCHC: 34.8 g/dL (ref 32.0–36.0)
NEUT%: 54.1 % (ref 39.0–75.0)
Platelets: 139 10*3/uL — ABNORMAL LOW (ref 140–400)
RBC: 4.2 10*6/uL (ref 4.20–5.82)

## 2012-10-26 MED ORDER — BORTEZOMIB CHEMO SQ INJECTION 3.5 MG (2.5MG/ML)
1.3000 mg/m2 | Freq: Once | INTRAMUSCULAR | Status: AC
  Administered 2012-10-26: 3 mg via SUBCUTANEOUS
  Filled 2012-10-26: qty 3

## 2012-10-26 MED ORDER — ONDANSETRON HCL 8 MG PO TABS
8.0000 mg | ORAL_TABLET | Freq: Once | ORAL | Status: AC
  Administered 2012-10-26: 8 mg via ORAL

## 2012-10-26 MED ORDER — DEXAMETHASONE 4 MG PO TABS
40.0000 mg | ORAL_TABLET | Freq: Once | ORAL | Status: AC
  Administered 2012-10-26: 40 mg via ORAL

## 2012-10-26 NOTE — Patient Instructions (Addendum)
[facility] Discharge Instructions for Patients Receiving Chemotherapy  Today you received the following chemotherapy agent Velcade.  To help prevent nausea and vomiting after your treatment, we encourage you to take your nausea medication. Begin taking your nausea medication as often as prescribed for by Dr Truett Perna.    If you develop nausea and vomiting that is not controlled by your nausea medication, call the clinic. If it is after clinic hours your family physician or the after hours number for the clinic or go to the Emergency Department.   BELOW ARE SYMPTOMS THAT SHOULD BE REPORTED IMMEDIATELY:  *FEVER GREATER THAN 100.5 F  *CHILLS WITH OR WITHOUT FEVER  NAUSEA AND VOMITING THAT IS NOT CONTROLLED WITH YOUR NAUSEA MEDICATION  *UNUSUAL SHORTNESS OF BREATH  *UNUSUAL BRUISING OR BLEEDING  TENDERNESS IN MOUTH AND THROAT WITH OR WITHOUT PRESENCE OF ULCERS  *URINARY PROBLEMS  *BOWEL PROBLEMS  UNUSUAL RASH Items with * indicate a potential emergency and should be followed up as soon as possible.  One of the nurses will contact you 24 hours after your treatment. Please let the nurse know about any problems that you may have experienced. Feel free to call the clinic you have any questions or concerns. The clinic phone number is [phone].   I have been informed and understand all the instructions given to me. I know to contact the clinic, my physician, or go to the Emergency Department if any problems should occur. I do not have any questions at this time, but understand that I may call the clinic during office hours   should I have any questions or need assistance in obtaining follow up care.    __________________________________________  _____________  __________ Signature of Patient or Authorized Representative            Date                   Time    __________________________________________ Nurse's Signature

## 2012-10-29 ENCOUNTER — Other Ambulatory Visit: Payer: Self-pay | Admitting: Oncology

## 2012-11-02 ENCOUNTER — Ambulatory Visit (HOSPITAL_BASED_OUTPATIENT_CLINIC_OR_DEPARTMENT_OTHER): Payer: Medicare Other

## 2012-11-02 ENCOUNTER — Ambulatory Visit (HOSPITAL_BASED_OUTPATIENT_CLINIC_OR_DEPARTMENT_OTHER): Payer: Medicare Other | Admitting: Nurse Practitioner

## 2012-11-02 ENCOUNTER — Other Ambulatory Visit: Payer: Self-pay | Admitting: *Deleted

## 2012-11-02 ENCOUNTER — Other Ambulatory Visit (HOSPITAL_BASED_OUTPATIENT_CLINIC_OR_DEPARTMENT_OTHER): Payer: Medicare Other | Admitting: Lab

## 2012-11-02 VITALS — BP 109/71 | HR 83 | Temp 97.2°F | Resp 20 | Ht 72.0 in | Wt 249.7 lb

## 2012-11-02 DIAGNOSIS — C9000 Multiple myeloma not having achieved remission: Secondary | ICD-10-CM

## 2012-11-02 DIAGNOSIS — M549 Dorsalgia, unspecified: Secondary | ICD-10-CM

## 2012-11-02 DIAGNOSIS — D6481 Anemia due to antineoplastic chemotherapy: Secondary | ICD-10-CM

## 2012-11-02 DIAGNOSIS — Z5112 Encounter for antineoplastic immunotherapy: Secondary | ICD-10-CM

## 2012-11-02 DIAGNOSIS — R0602 Shortness of breath: Secondary | ICD-10-CM

## 2012-11-02 LAB — CBC WITH DIFFERENTIAL/PLATELET
BASO%: 1 % (ref 0.0–2.0)
EOS%: 2 % (ref 0.0–7.0)
MCH: 30.2 pg (ref 27.2–33.4)
MCHC: 34.7 g/dL (ref 32.0–36.0)
RBC: 4.3 10*6/uL (ref 4.20–5.82)
RDW: 17.5 % — ABNORMAL HIGH (ref 11.0–14.6)
WBC: 4.8 10*3/uL (ref 4.0–10.3)
lymph#: 1.1 10*3/uL (ref 0.9–3.3)

## 2012-11-02 LAB — COMPREHENSIVE METABOLIC PANEL (CC13)
ALT: 12 U/L (ref 0–55)
CO2: 24 mEq/L (ref 22–29)
Creatinine: 1 mg/dL (ref 0.7–1.3)
Total Bilirubin: 0.44 mg/dL (ref 0.20–1.20)

## 2012-11-02 MED ORDER — ONDANSETRON HCL 8 MG PO TABS
8.0000 mg | ORAL_TABLET | Freq: Once | ORAL | Status: AC
  Administered 2012-11-02: 8 mg via ORAL

## 2012-11-02 MED ORDER — BORTEZOMIB CHEMO SQ INJECTION 3.5 MG (2.5MG/ML)
1.3000 mg/m2 | Freq: Once | INTRAMUSCULAR | Status: AC
  Administered 2012-11-02: 3 mg via SUBCUTANEOUS
  Filled 2012-11-02: qty 3

## 2012-11-02 MED ORDER — DEXAMETHASONE 4 MG PO TABS
40.0000 mg | ORAL_TABLET | Freq: Once | ORAL | Status: AC
  Administered 2012-11-02: 40 mg via ORAL

## 2012-11-02 NOTE — Telephone Encounter (Signed)
gv and printed appt sched and avs for pt....emailed michelle to add tx.Marland KitchenMarland Kitchen

## 2012-11-02 NOTE — Progress Notes (Signed)
OFFICE PROGRESS NOTE  Interval history:  [name] returns as scheduled. He continues Cytoxan/Velcade/dexamethasone therapy. Most recent IgG level was improved at 1730 on 10/05/2012 and most recent serum M spike was improved at 1.39 on 10/05/2012.  He overall is feeling well. No nausea or vomiting. Bowels moving with the aid of a laxative. Stable mild dyspnea on exertion. No fevers. No cough. He continues to have tingling in the right fingertips. He notes intermittent tenderness over the fingertips on the left hand. He has an intermittent burning sensation at the left lower leg.   Objective: Blood pressure 109/71, pulse 83, temperature 97.2 F (36.2 C), temperature source Oral, resp. rate 20, height 6' (1.829 m), weight 249 lb 11.2 oz (113.263 kg).  No thrush or ulceration. Lungs are clear. Regular cardiac rhythm. Abdomen soft and nontender. No organomegaly. Extremities without edema. Vibratory sense mildly decreased over the fingertips bilaterally.  Lab Results: Lab Results  Component Value Date   WBC 4.8 11/02/2012   HGB 13.0 11/02/2012   HCT 37.4* 11/02/2012   MCV 87.0 11/02/2012   PLT 120* 11/02/2012    Chemistry:    Chemistry      Component Value Date/Time   NA 138 10/05/2012 1119   NA 134* 03/21/2012 0430   K 3.9 10/05/2012 1119   K 3.6 03/21/2012 0430   CL 106 10/05/2012 1119   CL 102 03/21/2012 0430   CO2 25 10/05/2012 1119   CO2 27 03/21/2012 0430   BUN 9.9 10/05/2012 1119   BUN 5* 03/21/2012 0430   CREATININE 0.8 10/05/2012 1119   CREATININE 0.71 03/21/2012 0430      Component Value Date/Time   CALCIUM 8.8 10/05/2012 1119   CALCIUM 8.3* 03/21/2012 0430   ALKPHOS 51 08/31/2012 1035   ALKPHOS 66 03/20/2012 0135   AST 20 08/31/2012 1035   AST 14 03/20/2012 0135   ALT 13 08/31/2012 1035   ALT 15 03/20/2012 0135   BILITOT 0.67 08/31/2012 1035   BILITOT 0.2* 03/20/2012 0135       Studies/Results: No results found.  Medications: I have reviewed the patient's current  medications.  Assessment/Plan:  1. Multiple myeloma, status post treatment with melphalan/prednisone/thalidomide beginning in November 2009. The thalidomide was increased to 200 mg daily beginning 09/11/2008. The serum M spike was slightly improved on 03/18/2009 and slightly increased on 05/09/2009. He has been maintained off of specific therapy for multiple myeloma since September 2010. The serum M spike was increased on 10/20/2011 at 4.6. He began Revlimid on 11/11/2011. He takes weekly dexamethasone. He began cycle 2 of Revlimid on 12/09/2011. The serum M spike was improved on 12/31/2011. The serum M spike was slightly higher on 01/28/2012. He began cycle 4 on 02/03/2012. He developed recurrent hypercalcemia. Treatment was changed to Cytoxan/Velcade/Decadron beginning 03/02/2012. The serum M spike was lower on 02/25/2012. The serum M spike was stable on 06/09/2012. The serum M spike improved on 08/03/2012 and stable on 08/31/2012. The IgG level was stable on 08/31/2012. The IgG level and serum M spike were improved on 10/05/2012. 2. Chronic "dizziness." 3. Chest x-ray 11/01/2008 with a patchy opacity at the right midlung suspicious for pneumonia. He was treated with Avelox. 4. Low back pain with a radicular component. An MRI on 10/07/2008 showed no involvement of the lumbosacral spine with myeloma. 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 5. Bilateral neck pain, status post a CT 05/17/2008 with findings of spinal stenosis and  osteoarthritis. 6. 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 Red cell microcytosis, likely related to hemoglobin C trait. A ferritin level returned elevated and a stool Hemoccult was negative 01/23/2010. He underwent a colonoscopy in 2009. 8. Elevated beta-2 microglobulin level. 9. History of hypertension. 10. History of  a herniated disk at L4-L5 on MRI an 01/11/2002. 11. Hyperlipidemia. 12. Gastroesophageal reflux disease. 13. History of atypical angina. 14. History of rectal bleeding-large external hemorrhoids noted 02/25/2012. The stool was Hemoccult positive. He has a history of colon polyps. He is followed by [facility]. He was diagnosed with hemorrhoids in September of 2013 while hospitalized. 15. Superficial venous thrombosis of the greater saphenous vein on a Doppler 09/27/2008. Negative for deep vein thrombosis. Symptoms improved with aspirin. 16. Neck pain with numbness/tingling in the arms, hands, and low back with proximal right leg weakness. An MRI of the cervical spine 05/30/2009 showed multilevel spondylosis with mild cord edema at C4-C5 and C5-C6. He was noted to have an enhancing lesion of the cord at T3-T4 of unclear etiology. In the lumbar spine there was no change in the spondylosis at L3-L4 and L4-L5. There was no involvement of the cervical or lumbar spine with myeloma. He is status post C4-C5, C5-C6, and C6-C7 anterior cervical diskectomy with fusion by Dr. Jordan Likes 08/01/2009. 17. History of mild thrombocytopenia. 18. Indeterminate-age deep vein thrombosis of the left lower extremity on a venous Doppler 08/15/2009. There were also findings consistent with superficial thrombosis involving the right lower extremity 08/15/2009. Coumadin was discontinued in October 2011. 19. Type 2 diabetes. 20. History of hematuria, followed at [facility]. Urinalysis was negative for blood on 07/23/2011. 21. Radicular back pain with MRI of the lumbar spine on 10/19/2011 showing nerve root impingement at L2-L3, L3-L4 and L4-L5 with the most severe at the right L3 nerve roots due to a large disc fragment. Associated right leg weakness. He underwent right L2-3 decompressive laminotomy with right L2 and L3 decompressive foraminotomy; right L2-3 microdiscectomy on 11/01/2011. 22. Constipation He continues a laxative  regimen. 23. Hospitalization 11/08/2011 through 11/10/2011 with orthostatic hypotension/dizziness. He improved with intravenous hydration. He had mild hypotension when here on 11/18/2011. We recommended discontinuing Norvasc and Proscar. 24. Hypercalcemia status post pamidronate 11/03/2011. Recurrent hypercalcemia 02/16/2012 status post Zometa. The calcium has remained in normal range. 25. Fractured tooth with associated pain. Status post a tooth extraction by Dr. Kristin Bruins, he is to be scheduled for multiple tooth extractions in the near future. Zometa has been placed on hold. 26. Exertional dyspnea-etiology unclear. Stable. He reports an upcoming appointment with cardiology.  Disposition-[name] appears stable. Most recent IgG level and serum M spike were improved. Plan to continue Cytoxan/Velcade/Decadron on the current schedule. He will return for a followup visit in one month.  Plan reviewed with Dr. Truett Perna.  Lonna Cobb ANP/GNP-BC

## 2012-11-02 NOTE — Telephone Encounter (Signed)
Per staff message and POF I have scheduled appts.  JMW  

## 2012-11-02 NOTE — Patient Instructions (Signed)
[facility] Discharge Instructions for Patients Receiving Chemotherapy  Today you received the following chemotherapy agents Velcade.  To help prevent nausea and vomiting after your treatment, we encourage you to take your nausea medication as prescribed.   If you develop nausea and vomiting that is not controlled by your nausea medication, call the clinic. If it is after clinic hours your family physician or the after hours number for the clinic or go to the Emergency Department.   BELOW ARE SYMPTOMS THAT SHOULD BE REPORTED IMMEDIATELY:  *FEVER GREATER THAN 100.5 F  *CHILLS WITH OR WITHOUT FEVER  NAUSEA AND VOMITING THAT IS NOT CONTROLLED WITH YOUR NAUSEA MEDICATION  *UNUSUAL SHORTNESS OF BREATH  *UNUSUAL BRUISING OR BLEEDING  TENDERNESS IN MOUTH AND THROAT WITH OR WITHOUT PRESENCE OF ULCERS  *URINARY PROBLEMS  *BOWEL PROBLEMS  UNUSUAL RASH Items with * indicate a potential emergency and should be followed up as soon as possible.  One of the nurses will contact you 24 hours after your treatment. Please let the nurse know about any problems that you may have experienced. Feel free to call the clinic you have any questions or concerns. The clinic phone number is [phone].   I have been informed and understand all the instructions given to me. I know to contact the clinic, my physician, or go to the Emergency Department if any problems should occur. I do not have any questions at this time, but understand that I may call the clinic during office hours   should I have any questions or need assistance in obtaining follow up care.    __________________________________________  _____________  __________ Signature of Patient or Authorized Representative            Date                   Time    __________________________________________ Nurse's Signature    

## 2012-11-06 ENCOUNTER — Ambulatory Visit (INDEPENDENT_AMBULATORY_CARE_PROVIDER_SITE_OTHER): Payer: Medicare Other | Admitting: Cardiology

## 2012-11-06 ENCOUNTER — Encounter: Payer: Self-pay | Admitting: *Deleted

## 2012-11-06 ENCOUNTER — Ambulatory Visit: Payer: Self-pay | Admitting: Cardiology

## 2012-11-06 ENCOUNTER — Encounter: Payer: Self-pay | Admitting: Cardiology

## 2012-11-06 VITALS — BP 119/75 | HR 77 | Ht 72.0 in | Wt 250.4 lb

## 2012-11-06 DIAGNOSIS — I10 Essential (primary) hypertension: Secondary | ICD-10-CM

## 2012-11-06 DIAGNOSIS — R0602 Shortness of breath: Secondary | ICD-10-CM | POA: Insufficient documentation

## 2012-11-06 DIAGNOSIS — R079 Chest pain, unspecified: Secondary | ICD-10-CM

## 2012-11-06 DIAGNOSIS — C9000 Multiple myeloma not having achieved remission: Secondary | ICD-10-CM

## 2012-11-06 DIAGNOSIS — E782 Mixed hyperlipidemia: Secondary | ICD-10-CM

## 2012-11-06 LAB — PROTEIN ELECTROPHORESIS, SERUM
Alpha-1-Globulin: 4 % (ref 2.9–4.9)
Beta 2: 3.2 % (ref 3.2–6.5)
Gamma Globulin: 20.7 % — ABNORMAL HIGH (ref 11.1–18.8)

## 2012-11-06 NOTE — Progress Notes (Signed)
Clinical Summary [name] is a 77 y.o.male referred for cardiology consultation by Dr. Modesto Charon. He reports a history of progressive shortness of breath as well as intermittent indigestion-like chest discomfort, most notable over the last several months. At baseline there is no history of CAD or congestive heart failure. He was seen by Dr. Antoine Poche back in 2004 with atypical chest pain. Adenosine Myoview in June of 2004 was negative for ischemia. Echocardiogram at that time indicated an LVEF of 45-50% with mild diffuse hypokinesis, no major valvular abnormalities. He has had no followup testing since that time.  Recent ECG from February reviewed showing sinus rhythm with PAC.  He states that his shortness of breath is now NYHA class III. He denies any orthopnea or PND, but does have dependent edema, particularly when he stands for long periods of time.  Medications and chronic medical history outlined below.  Recent lab work reviewed finding hemoglobin 13.0, platelets 120, potassium 4.0, BUN 16, creatinine 1.0. Chest x-ray from February was normal.   Allergies  Allergen Reactions  . Penicillins Hives and Rash    Current Outpatient Prescriptions  Medication Sig Dispense Refill  . acyclovir (ZOVIRAX) 400 MG tablet Take 1 tablet (400 mg total) by mouth 2 (two) times daily.  60 tablet  2  . amLODipine (NORVASC) 5 MG tablet Take 1 tablet (5 mg total) by mouth daily.  30 tablet  4  . aspirin 325 MG EC tablet Take 325 mg by mouth daily.      Marland Kitchen atorvastatin (LIPITOR) 10 MG tablet Take 1 tablet (10 mg total) by mouth daily.  30 tablet  3  . cyclophosphamide (CYTOXAN) 50 MG tablet Take 12 tablets (600 mg total) by mouth once a week. Give on an empty stomach 1 hour before or 2 hours after meals.  48 tablet  0  . dexamethasone (DECADRON) 4 MG tablet Take 5 tablets (20 mg total) by mouth as directed. Take 20 mg by mouth on Thursday not getting dose at [facility]  5 tablet  3  . KLOR-CON M20 20 MEQ  tablet TAKE 1 TABLET EVERY DAY  30 tablet  3  . omeprazole (PRILOSEC) 20 MG capsule Take 1 capsule (20 mg total) by mouth daily.  30 capsule  4  . Polyethylene Glycol 3350 (MIRALAX PO) Take 17 g by mouth every morning.      . prochlorperazine (COMPAZINE) 10 MG tablet TAKE 1 TABLET BY MOUTH EVERY SIX HOURS AS NEEDED  30 tablet  2  . tamsulosin (FLOMAX) 0.4 MG CAPS Take 1 capsule (0.4 mg total) by mouth daily.  30 capsule  4  . zolpidem (AMBIEN) 10 MG tablet Take 0.5 tablets (5 mg total) by mouth at bedtime as needed.  30 tablet  3   No current facility-administered medications for this visit.    Past Medical History  Diagnosis Date  . GERD (gastroesophageal reflux disease)   . Essential hypertension, benign   . BPH (benign prostatic hyperplasia)   . Lumbar herniated disc     L4-5  . Hyperlipidemia   . Sleep apnea   . Multiple myeloma(203.0) 02/19/2008  . Arthritis   . Diverticulosis   . Peptic ulcer disease   . Tubular adenoma 02/16/2008    Dr. Stan Head    Past Surgical History  Procedure Laterality Date  . Neck surgery    . Lumbar laminectomy/decompression microdiscectomy  11/01/2011    Procedure: LUMBAR LAMINECTOMY/DECOMPRESSION MICRODISCECTOMY 2 LEVELS;  Surgeon: Temple Pacini, MD;  Location: [facility];  Service: Neurosurgery;  Laterality: Right;  Lumbar Laminectomy/Microdiscectomy Decompression Lumbar Three-Four, Lumbar Four-Five   . Colonoscopy      Family History  Problem Relation Age of Onset  . Adopted: Yes    Social History [name] reports that he quit smoking about 22 years ago. His smoking use included Cigarettes. He has a 40 pack-year smoking history. He has never used smokeless tobacco. [name] reports that he does not drink alcohol.  Review of Systems No palpitations. No cough, fevers or chills. Stable appetite. No syncope. Arthritic discomfort. Otherwise negative.  Physical Examination Filed Vitals:   11/06/12 1405  BP: 119/75  Pulse: 77    Filed Weights   11/06/12 1405  Weight: 250 lb 6.4 oz (113.581 kg)   Obese male in no acute distress. HEENT: Conjunctiva and lids normal, oropharynx clear. Neck: Supple, increased girth without elevated JVP or carotid bruits, no thyromegaly. Lungs: Diminished but clear to auscultation, nonlabored breathing at rest. Cardiac: Regular rate and rhythm, indistinct PMI, no S3 or significant systolic murmur. Abdomen: Protuberant , soft, nontender, bowel sounds present. Extremities: 1-2+ edema around ankles and lower legs, distal pulses 1-2+. Skin: Warm and dry. Musculoskeletal: No kyphosis. Neuropsychiatric: Alert and oriented x3, affect grossly appropriate.   Problem List and Plan   Shortness of breath Progressive, noted over the last several months, now NYHA class III and associated with indigestion-like chest pain. Recent ECG reviewed without significant ST segment change. He does have other comorbid associated illnesses including multiple myeloma (followed by Dr. Truett Perna), hypertension, hyperlipidemia, sleep apnea, also is overweight. He has not had ischemic or structural cardiac evaluation in the last 10 years. Plan is to proceed with a Lexiscan Myoview as well as an echocardiogram to assess for ischemic heart disease and also evaluate LVEF and pulmonary pressures. We will call him with the results, and can determine if further cardiac evaluation is needed.  Multiple myeloma Followed by Dr. Truett Perna.  Essential hypertension, benign Blood pressure is normal today.  Mixed hyperlipidemia On Lipitor, followed by Dr. Modesto Charon. I cannot locate any recent lipid profiles.    Jonelle Sidle, M.D., F.A.C.C.

## 2012-11-06 NOTE — Assessment & Plan Note (Signed)
On Lipitor, followed by Dr. Modesto Charon. I cannot locate any recent lipid profiles.

## 2012-11-06 NOTE — Assessment & Plan Note (Signed)
Blood pressure is normal today. 

## 2012-11-06 NOTE — Assessment & Plan Note (Signed)
Progressive, noted over the last several months, now NYHA class III and associated with indigestion-like chest pain. Recent ECG reviewed without significant ST segment change. He does have other comorbid associated illnesses including multiple myeloma (followed by Dr. Truett Perna), hypertension, hyperlipidemia, sleep apnea, also is overweight. He has not had ischemic or structural cardiac evaluation in the last 10 years. Plan is to proceed with a Lexiscan Myoview as well as an echocardiogram to assess for ischemic heart disease and also evaluate LVEF and pulmonary pressures. We will call him with the results, and can determine if further cardiac evaluation is needed.

## 2012-11-06 NOTE — Patient Instructions (Addendum)
Your physician recommends that you continue on your current medications as directed. Please refer to the Current Medication list given to you today..  Your physician has requested that you have a lexiscan myoview. For further information please visit [email or web address]. Please follow instruction sheet, as given.  Your physician has requested that you have an echocardiogram. Echocardiography is a painless test that uses sound waves to create images of your heart. It provides your doctor with information about the size and shape of your heart and how well your heart's chambers and valves are working. This procedure takes approximately one hour. There are no restrictions for this procedure.   

## 2012-11-06 NOTE — Assessment & Plan Note (Signed)
Followed by Dr Sherrill.  

## 2012-11-07 ENCOUNTER — Encounter: Payer: Self-pay | Admitting: *Deleted

## 2012-11-07 NOTE — Progress Notes (Signed)
RECEIVED A FAX FROM [facility] CONCERNING A CONFIRMATION OF PRESCRIPTION SHIPMENT FOR CYCLOPHOSPHAMIDE ON 11/06/12.

## 2012-11-13 ENCOUNTER — Ambulatory Visit (HOSPITAL_COMMUNITY): Payer: Medicare Other | Attending: Cardiology | Admitting: Radiology

## 2012-11-13 VITALS — BP 121/79 | Ht 72.0 in | Wt 248.0 lb

## 2012-11-13 DIAGNOSIS — I10 Essential (primary) hypertension: Secondary | ICD-10-CM | POA: Insufficient documentation

## 2012-11-13 DIAGNOSIS — R079 Chest pain, unspecified: Secondary | ICD-10-CM

## 2012-11-13 DIAGNOSIS — C9000 Multiple myeloma not having achieved remission: Secondary | ICD-10-CM | POA: Insufficient documentation

## 2012-11-13 DIAGNOSIS — R0789 Other chest pain: Secondary | ICD-10-CM | POA: Insufficient documentation

## 2012-11-13 DIAGNOSIS — Z87891 Personal history of nicotine dependence: Secondary | ICD-10-CM | POA: Insufficient documentation

## 2012-11-13 DIAGNOSIS — R0602 Shortness of breath: Secondary | ICD-10-CM | POA: Insufficient documentation

## 2012-11-13 DIAGNOSIS — E785 Hyperlipidemia, unspecified: Secondary | ICD-10-CM | POA: Insufficient documentation

## 2012-11-13 MED ORDER — TECHNETIUM TC 99M SESTAMIBI GENERIC - CARDIOLITE
11.0000 | Freq: Once | INTRAVENOUS | Status: AC | PRN
  Administered 2012-11-13: 11 via INTRAVENOUS

## 2012-11-13 MED ORDER — TECHNETIUM TC 99M SESTAMIBI GENERIC - CARDIOLITE
33.0000 | Freq: Once | INTRAVENOUS | Status: AC | PRN
  Administered 2012-11-13: 33 via INTRAVENOUS

## 2012-11-13 MED ORDER — REGADENOSON 0.4 MG/5ML IV SOLN
0.4000 mg | Freq: Once | INTRAVENOUS | Status: AC
  Administered 2012-11-13: 0.4 mg via INTRAVENOUS

## 2012-11-13 NOTE — Progress Notes (Signed)
[facility] SITE 3 NUCLEAR MED [address] [place], [place] [address] [phone]    Cardiology Nuclear Med Study  [name] is a 77 y.o. male     MRN : [id number]     DOB: 1936-05-25  Procedure Date: 11/13/2012  Nuclear Med Background Indication for Stress Test:  Evaluation for Ischemia History:  2004 ECHO: EF: 45-50% diffuse HK MPS: (-) ischemia EF: 49% mild activity intraseptal at stress and rest, multiple myeloma Cardiac Risk Factors: History of Smoking, Hypertension and Lipids  Symptoms:  Chest Pain, Chest Tightness, and SOB   Nuclear Pre-Procedure Caffeine/Decaff Intake:  None > 12 hrs NPO After: 5:00pm   Lungs:  clear O2 Sat: 98% on room air. IV 0.9% NS with Angio Cath:  22g  IV Site: R Hand x 1, tolerated well IV Started by:  Irean Hong, RN  Chest Size (in):  46 Cup Size: n/a  Height: 6' (1.829 m)  Weight:  248 lb (112.492 kg)  BMI:  Body mass index is 33.63 kg/(m^2). Tech Comments:  Took am medications    Nuclear Med Study 1 or 2 day study: 1 day  Stress Test Type:  Lexiscan  Reading MD: Marca Ancona, MD  Order Authorizing Provider:  Nona Dell, MD  Resting Radionuclide: Technetium 67m Sestamibi  Resting Radionuclide Dose: 11.0 mCi   Stress Radionuclide:  Technetium 70m Sestamibi  Stress Radionuclide Dose: 33.0 mCi           Stress Protocol Rest HR: 68 Stress HR: 93  Rest BP: 121/79 Stress BP: 124/77  Exercise Time (min): n/a METS: n/a   Predicted Max HR: 145 bpm % Max HR: 64.14 bpm Rate Pressure Product: 21308   Dose of Adenosine (mg):  n/a Dose of Lexiscan: 0.4 mg  Dose of Atropine (mg): n/a Dose of Dobutamine: n/a mcg/kg/min (at max HR)  Stress Test Technologist: Milana Na, EMT-P  Nuclear Technologist:  Domenic Polite, CNMT     Rest Procedure:  Myocardial perfusion imaging was performed at rest 45 minutes following the intravenous administration of Technetium 51m Sestamibi. Rest ECG: NSR - Normal EKG  Stress Procedure:   The patient received IV Lexiscan 0.4 mg over 15-seconds.  Technetium 77m Sestamibi injected at 30-seconds. This patient had sob, chest pain, and abdominal pain with the Lexiscan injection. Quantitative spect images were obtained after a 45 minute delay. Stress ECG: No significant change from baseline ECG  QPS Raw Data Images:  Normal; no motion artifact; normal heart/lung ratio. Stress Images:  Medium-sized, moderate basal to mid inferior and inferoseptal perfusion defect. Rest Images:  Medium-sized, severe basal to mid inferior and inferoseptal perfusion defect. Subtraction (SDS):  Fixed (actually looks worse at rest) medium-sized basal to mid inferior and inferoseptal perfusion defect.  Transient Ischemic Dilatation (Normal <1.22):  1.00 Lung/Heart Ratio (Normal <0.45):  0.31  Quantitative Gated Spect Images QGS EDV:  106 ml QGS ESV:  45 ml  Impression Exercise Capacity:  Lexiscan with no exercise. BP Response:  Hypotensive blood pressure response. Clinical Symptoms:  Pain in chest, warmth.  ECG Impression:  No significant ST segment change suggestive of ischemia. Comparison with Prior Nuclear Study: Inferior/inferoseptal perfusion defect more marked than on prior study.   Overall Impression:  Low risk stress nuclear study.  There is a fixed (looks worse at rest) medium-sized basal to mid inferior and inferoseptal perfusion defect.  There is no evidence for ischemia.  Suspect prior infarction.  EF is preserved.   LV Ejection Fraction: 58%.  LV Wall Motion:  Basal inferior hypokinesis.  Marca Ancona 11/13/2012

## 2012-11-16 ENCOUNTER — Telehealth: Payer: Self-pay | Admitting: *Deleted

## 2012-11-16 ENCOUNTER — Ambulatory Visit (HOSPITAL_BASED_OUTPATIENT_CLINIC_OR_DEPARTMENT_OTHER): Payer: Medicare Other

## 2012-11-16 ENCOUNTER — Other Ambulatory Visit (HOSPITAL_BASED_OUTPATIENT_CLINIC_OR_DEPARTMENT_OTHER): Payer: Medicare Other

## 2012-11-16 ENCOUNTER — Other Ambulatory Visit: Payer: Self-pay | Admitting: Oncology

## 2012-11-16 VITALS — BP 120/75 | HR 73 | Temp 97.6°F | Resp 20

## 2012-11-16 DIAGNOSIS — C9000 Multiple myeloma not having achieved remission: Secondary | ICD-10-CM

## 2012-11-16 DIAGNOSIS — Z5112 Encounter for antineoplastic immunotherapy: Secondary | ICD-10-CM

## 2012-11-16 LAB — CBC WITH DIFFERENTIAL/PLATELET
Eosinophils Absolute: 0.1 10*3/uL (ref 0.0–0.5)
HCT: 37.3 % — ABNORMAL LOW (ref 38.4–49.9)
LYMPH%: 22.6 % (ref 14.0–49.0)
MCHC: 34.6 g/dL (ref 32.0–36.0)
MCV: 87 fL (ref 79.3–98.0)
MONO#: 0.9 10*3/uL (ref 0.1–0.9)
MONO%: 17.8 % — ABNORMAL HIGH (ref 0.0–14.0)
NEUT#: 3 10*3/uL (ref 1.5–6.5)
NEUT%: 56.2 % (ref 39.0–75.0)
Platelets: 146 10*3/uL (ref 140–400)
RBC: 4.29 10*6/uL (ref 4.20–5.82)
WBC: 5.3 10*3/uL (ref 4.0–10.3)

## 2012-11-16 MED ORDER — BORTEZOMIB CHEMO SQ INJECTION 3.5 MG (2.5MG/ML)
1.3000 mg/m2 | Freq: Once | INTRAMUSCULAR | Status: AC
  Administered 2012-11-16: 3 mg via SUBCUTANEOUS
  Filled 2012-11-16: qty 3

## 2012-11-16 MED ORDER — DEXAMETHASONE 4 MG PO TABS
40.0000 mg | ORAL_TABLET | Freq: Once | ORAL | Status: AC
  Administered 2012-11-16: 40 mg via ORAL

## 2012-11-16 MED ORDER — ONDANSETRON HCL 8 MG PO TABS
8.0000 mg | ORAL_TABLET | Freq: Once | ORAL | Status: AC
  Administered 2012-11-16: 8 mg via ORAL

## 2012-11-16 NOTE — Patient Instructions (Addendum)
[facility] Discharge Instructions for Patients Receiving Chemotherapy  Today you received the following chemotherapy agents Velcade  To help prevent nausea and vomiting after your treatment, we encourage you to take your nausea medication prochlorperazine 10 mg Begin taking it at anytime upon discharge home and take it as often as prescribed for the next 72 hours.   If you develop nausea and vomiting that is not controlled by your nausea medication, call the clinic. If it is after clinic hours your family physician or the after hours number for the clinic or go to the Emergency Department.   BELOW ARE SYMPTOMS THAT SHOULD BE REPORTED IMMEDIATELY:  *FEVER GREATER THAN 100.5 F  *CHILLS WITH OR WITHOUT FEVER  NAUSEA AND VOMITING THAT IS NOT CONTROLLED WITH YOUR NAUSEA MEDICATION  *UNUSUAL SHORTNESS OF BREATH  *UNUSUAL BRUISING OR BLEEDING  TENDERNESS IN MOUTH AND THROAT WITH OR WITHOUT PRESENCE OF ULCERS  *URINARY PROBLEMS  *BOWEL PROBLEMS  UNUSUAL RASH Items with * indicate a potential emergency and should be followed up as soon as possible.  Please call to let a nurse know about any problems that you may have experienced. Feel free to call the clinic you have any questions or concerns. The clinic phone number is [phone].   I have been informed and understand all the instructions given to me. I know to contact the clinic, my physician, or go to the Emergency Department if any problems should occur. I do not have any questions at this time, but understand that I may call the clinic during office hours   should I have any questions or need assistance in obtaining follow up care.    __________________________________________  _____________  __________ Signature of Patient or Authorized Representative            Date                   Time    __________________________________________ Nurse's Signature

## 2012-11-16 NOTE — Telephone Encounter (Signed)
Message copied by Eustace Moore on Thu Nov 16, 2012  8:57 AM ------      Message from: Jonelle Sidle      Created: Tue Nov 14, 2012  1:08 PM       Reviewed Myoview report, conclusion copied below. Suggests possible prior infarct scar but normal LVEF. Will await results of echocardiogram, and then determine the next step for followup. Importantly, no active ischemia at this time            Impression      Exercise Capacity:  Lexiscan with no exercise.      BP Response:  Hypotensive blood pressure response.      Clinical Symptoms:  Pain in chest, warmth.       ECG Impression:  No significant ST segment change suggestive of ischemia.      Comparison with Prior Nuclear Study: Inferior/inferoseptal perfusion defect more marked than on prior study.             Overall Impression:  Low risk stress nuclear study.  There is a fixed (looks worse at rest) medium-sized basal to mid inferior and inferoseptal perfusion defect.  There is no evidence for ischemia.  Suspect prior infarction.  EF is preserved.             LV Ejection Fraction: 58%.  LV Wall Motion:  Basal inferior hypokinesis.            Marca Ancona      11/13/2012             ------

## 2012-11-16 NOTE — Progress Notes (Signed)
Discharged at 1305, ambulatory with cane with spouse.  No distress

## 2012-11-17 ENCOUNTER — Ambulatory Visit (HOSPITAL_COMMUNITY): Payer: Medicare Other | Attending: Cardiology | Admitting: Radiology

## 2012-11-17 DIAGNOSIS — R0609 Other forms of dyspnea: Secondary | ICD-10-CM

## 2012-11-17 DIAGNOSIS — C9000 Multiple myeloma not having achieved remission: Secondary | ICD-10-CM | POA: Insufficient documentation

## 2012-11-17 DIAGNOSIS — R079 Chest pain, unspecified: Secondary | ICD-10-CM | POA: Insufficient documentation

## 2012-11-17 DIAGNOSIS — E669 Obesity, unspecified: Secondary | ICD-10-CM | POA: Insufficient documentation

## 2012-11-17 DIAGNOSIS — I10 Essential (primary) hypertension: Secondary | ICD-10-CM | POA: Insufficient documentation

## 2012-11-17 DIAGNOSIS — R0602 Shortness of breath: Secondary | ICD-10-CM | POA: Insufficient documentation

## 2012-11-17 DIAGNOSIS — Z87891 Personal history of nicotine dependence: Secondary | ICD-10-CM | POA: Insufficient documentation

## 2012-11-17 DIAGNOSIS — E785 Hyperlipidemia, unspecified: Secondary | ICD-10-CM | POA: Insufficient documentation

## 2012-11-17 DIAGNOSIS — R0989 Other specified symptoms and signs involving the circulatory and respiratory systems: Secondary | ICD-10-CM

## 2012-11-17 DIAGNOSIS — R072 Precordial pain: Secondary | ICD-10-CM

## 2012-11-17 NOTE — Progress Notes (Signed)
Echocardiogram performed.  

## 2012-11-19 ENCOUNTER — Other Ambulatory Visit: Payer: Self-pay | Admitting: Oncology

## 2012-11-21 ENCOUNTER — Telehealth: Payer: Self-pay | Admitting: *Deleted

## 2012-11-21 NOTE — Telephone Encounter (Signed)
Patient informed. 

## 2012-11-21 NOTE — Telephone Encounter (Signed)
Message copied by Eustace Moore on Tue Nov 21, 2012  8:55 AM ------      Message from: Jonelle Sidle      Created: Fri Nov 17, 2012  2:48 PM       Reviewed. LV function normal without obvious regional wall motion abnormality. This would argue that the inferior wall defect seen on Myoview my have been artifact rather than scar. Pulmonary artery pressures are mildly elevated, but no major valvular abnormalities. While neither test points toward a definitive diagnosis of obstructive CAD causing his symptoms, we could pursue further invasive cardiac testing (right and left heart catheterization) if his symptoms progress. Please let him know, and we can see him back if needed. ------

## 2012-11-21 NOTE — Telephone Encounter (Signed)
Wife informed and said that patient does want to pursue further testing since he is still having the chest pain.

## 2012-11-23 ENCOUNTER — Ambulatory Visit (HOSPITAL_BASED_OUTPATIENT_CLINIC_OR_DEPARTMENT_OTHER): Payer: Medicare Other

## 2012-11-23 ENCOUNTER — Other Ambulatory Visit (HOSPITAL_BASED_OUTPATIENT_CLINIC_OR_DEPARTMENT_OTHER): Payer: Medicare Other | Admitting: Lab

## 2012-11-23 ENCOUNTER — Ambulatory Visit (INDEPENDENT_AMBULATORY_CARE_PROVIDER_SITE_OTHER): Payer: Medicare Other | Admitting: Family Medicine

## 2012-11-23 ENCOUNTER — Encounter: Payer: Self-pay | Admitting: Family Medicine

## 2012-11-23 VITALS — BP 113/74 | HR 91 | Temp 97.6°F

## 2012-11-23 VITALS — BP 117/70 | HR 98 | Temp 98.2°F | Ht 70.0 in | Wt 250.0 lb

## 2012-11-23 DIAGNOSIS — C9000 Multiple myeloma not having achieved remission: Secondary | ICD-10-CM

## 2012-11-23 DIAGNOSIS — M5126 Other intervertebral disc displacement, lumbar region: Secondary | ICD-10-CM

## 2012-11-23 DIAGNOSIS — I10 Essential (primary) hypertension: Secondary | ICD-10-CM

## 2012-11-23 DIAGNOSIS — E782 Mixed hyperlipidemia: Secondary | ICD-10-CM

## 2012-11-23 DIAGNOSIS — J309 Allergic rhinitis, unspecified: Secondary | ICD-10-CM

## 2012-11-23 DIAGNOSIS — G4733 Obstructive sleep apnea (adult) (pediatric): Secondary | ICD-10-CM

## 2012-11-23 DIAGNOSIS — N289 Disorder of kidney and ureter, unspecified: Secondary | ICD-10-CM

## 2012-11-23 DIAGNOSIS — Z5112 Encounter for antineoplastic immunotherapy: Secondary | ICD-10-CM

## 2012-11-23 DIAGNOSIS — J302 Other seasonal allergic rhinitis: Secondary | ICD-10-CM

## 2012-11-23 DIAGNOSIS — M5116 Intervertebral disc disorders with radiculopathy, lumbar region: Secondary | ICD-10-CM

## 2012-11-23 LAB — CBC WITH DIFFERENTIAL/PLATELET
Basophils Absolute: 0 10*3/uL (ref 0.0–0.1)
HCT: 40 % (ref 38.4–49.9)
HGB: 13.8 g/dL (ref 13.0–17.1)
LYMPH%: 18 % (ref 14.0–49.0)
MCH: 29.9 pg (ref 27.2–33.4)
MONO#: 0.9 10*3/uL (ref 0.1–0.9)
NEUT%: 61.5 % (ref 39.0–75.0)
Platelets: 136 10*3/uL — ABNORMAL LOW (ref 140–400)
WBC: 5.2 10*3/uL (ref 4.0–10.3)
lymph#: 0.9 10*3/uL (ref 0.9–3.3)

## 2012-11-23 MED ORDER — ONDANSETRON HCL 8 MG PO TABS
8.0000 mg | ORAL_TABLET | Freq: Once | ORAL | Status: AC
  Administered 2012-11-23: 8 mg via ORAL

## 2012-11-23 MED ORDER — FLUTICASONE PROPIONATE 50 MCG/ACT NA SUSP: 2.0000 | Freq: Every day | NASAL | Status: DC

## 2012-11-23 MED ORDER — DEXAMETHASONE 4 MG PO TABS
40.0000 mg | ORAL_TABLET | Freq: Once | ORAL | Status: AC
  Administered 2012-11-23: 40 mg via ORAL

## 2012-11-23 MED ORDER — BORTEZOMIB CHEMO SQ INJECTION 3.5 MG (2.5MG/ML)
1.3000 mg/m2 | Freq: Once | INTRAMUSCULAR | Status: AC
  Administered 2012-11-23: 3 mg via SUBCUTANEOUS
  Filled 2012-11-23: qty 3

## 2012-11-23 NOTE — Progress Notes (Signed)
Patient ID: Coen Miyasato, male   DOB: Nov 28, 1935, 77 y.o.   MRN: [id number] SUBJECTIVE: HPI: Patient is here for follow up of hypertension/hyperlipidemia:  denies Headache;deniesChest Pain;denies weakness;denies Shortness of Breath or Orthopnea;denies Visual changes;denies palpitations;denies cough;denies pedal edema;denies symptoms of TIA or stroke; admits to Compliance with medications. denies Problems with medications.  Has congestion nasally and in the throat.  Has Multiple myeloma and is under treatment and doing well.   PMH/PSH: reviewed/updated in Epic  SH/FH: reviewed/updated in Epic  Allergies: reviewed/updated in Epic  Medications: reviewed/updated in Epic  Immunizations: reviewed/updated in Epic  ROS: As above in the HPI. All other systems are stable or negative.  OBJECTIVE: APPEARANCE:  Patient in no acute distress.The patient appeared well nourished and normally developed. Acyanotic. Waist: VITAL SIGNS:BP 117/70  Pulse 98  Temp(Src) 98.2 F (36.8 C) (Oral)  Ht 5\' 10"  (1.778 m)  Wt 250 lb (113.399 kg)  BMI 35.87 kg/m2 Obese AA  Male Ambulates with a cane.  SKIN: warm and  Dry without overt rashes, tattoos and scars  HEAD and Neck: without JVD, Head and scalp: normal Eyes:No scleral icterus. Fundi normal, eye movements normal. Ears: Auricle normal, canal normal, Tympanic membranes normal, insufflation normal. Nose:congested and rhinitis and postnasal drip. Throat: normal Neck & thyroid: normal  CHEST & LUNGS: Chest wall: normal Lungs: Clear  CVS: Reveals the PMI to be normally located. Regular rhythm, First and Second Heart sounds are normal,  absence of murmurs, rubs or gallops. Peripheral vasculature: Radial pulses: normal Dorsal pedis pulses: normal Posterior pulses: normal  ABDOMEN:  Appearance: obese Benign,, no organomegaly, no masses, no Abdominal Aortic enlargement. No Guarding , no rebound. No Bruits. Bowel sounds: normal  RECTAL:  N/A GU: N/A  EXTREMETIES: nonedematous.  ASSESSMENT: Seasonal allergic rhinitis - Plan: fluticasone (FLONASE) 50 MCG/ACT nasal spray  Multiple myeloma  Renal insufficiency  Obstructive sleep apnea  Mixed hyperlipidemia  Essential hypertension, benign  Lumbar disc herniation with radiculopathy   PLAN:  No orders of the defined types were placed in this encounter.    Results for orders placed in visit on 11/23/12  CBC WITH DIFFERENTIAL      Result Value Range   WBC 5.2  4.0 - 10.3 10e3/uL   NEUT# 3.2  1.5 - 6.5 10e3/uL   HGB 13.8  13.0 - 17.1 g/dL   HCT 78.2  95.6 - 21.3 %   Platelets 136 (*) 140 - 400 10e3/uL   MCV 86.5  79.3 - 98.0 fL   MCH 29.9  27.2 - 33.4 pg   MCHC 34.6  32.0 - 36.0 g/dL   RBC 0.86  5.78 - 4.69 10e6/uL   RDW 17.0 (*) 11.0 - 14.6 %   lymph# 0.9  0.9 - 3.3 10e3/uL   MONO# 0.9  0.1 - 0.9 10e3/uL   Eosinophils Absolute 0.1  0.0 - 0.5 10e3/uL   Basophils Absolute 0.0  0.0 - 0.1 10e3/uL   NEUT% 61.5  39.0 - 75.0 %   LYMPH% 18.0  14.0 - 49.0 %   MONO% 18.0 (*) 0.0 - 14.0 %   EOS% 1.9  0.0 - 7.0 %   BASO% 0.6  0.0 - 2.0 %    Meds ordered this encounter  Medications  . fluticasone (FLONASE) 50 MCG/ACT nasal spray    Sig: Place 2 sprays into the nose daily.    Dispense:  16 g    Refill:  6    Continue with specialists. BP stable no change in regimen.  Labs done mainly through the specialists.  RTC 4 months.  Kristilyn Coltrane P. Modesto Charon, M.D.

## 2012-11-23 NOTE — Patient Instructions (Addendum)
[facility] Discharge Instructions for Patients Receiving Chemotherapy  Today you received the following chemotherapy agents:  Velcade  To help prevent nausea and vomiting after your treatment, we encourage you to take your nausea medication as ordered per MD.    If you develop nausea and vomiting that is not controlled by your nausea medication, call the clinic. If it is after clinic hours your family physician or the after hours number for the clinic or go to the Emergency Department.   BELOW ARE SYMPTOMS THAT SHOULD BE REPORTED IMMEDIATELY:  *FEVER GREATER THAN 100.5 F  *CHILLS WITH OR WITHOUT FEVER  NAUSEA AND VOMITING THAT IS NOT CONTROLLED WITH YOUR NAUSEA MEDICATION  *UNUSUAL SHORTNESS OF BREATH  *UNUSUAL BRUISING OR BLEEDING  TENDERNESS IN MOUTH AND THROAT WITH OR WITHOUT PRESENCE OF ULCERS  *URINARY PROBLEMS  *BOWEL PROBLEMS  UNUSUAL RASH Items with * indicate a potential emergency and should be followed up as soon as possible.   Please let the nurse know about any problems that you may have experienced. Feel free to call the clinic you have any questions or concerns. The clinic phone number is [phone].   I have been informed and understand all the instructions given to me. I know to contact the clinic, my physician, or go to the Emergency Department if any problems should occur. I do not have any questions at this time, but understand that I may call the clinic during office hours   should I have any questions or need assistance in obtaining follow up care.    __________________________________________  _____________  __________ Signature of Patient or Authorized Representative            Date                   Time    __________________________________________ Nurse's Signature    

## 2012-11-30 ENCOUNTER — Ambulatory Visit (HOSPITAL_BASED_OUTPATIENT_CLINIC_OR_DEPARTMENT_OTHER): Payer: Medicare Other

## 2012-11-30 ENCOUNTER — Ambulatory Visit (HOSPITAL_BASED_OUTPATIENT_CLINIC_OR_DEPARTMENT_OTHER): Payer: Medicare Other | Admitting: Nurse Practitioner

## 2012-11-30 ENCOUNTER — Other Ambulatory Visit (HOSPITAL_BASED_OUTPATIENT_CLINIC_OR_DEPARTMENT_OTHER): Payer: Medicare Other | Admitting: Lab

## 2012-11-30 ENCOUNTER — Other Ambulatory Visit: Payer: Self-pay | Admitting: Nurse Practitioner

## 2012-11-30 VITALS — BP 132/66 | HR 79 | Temp 97.1°F | Resp 18 | Ht 70.0 in | Wt 254.9 lb

## 2012-11-30 DIAGNOSIS — R0989 Other specified symptoms and signs involving the circulatory and respiratory systems: Secondary | ICD-10-CM

## 2012-11-30 DIAGNOSIS — G609 Hereditary and idiopathic neuropathy, unspecified: Secondary | ICD-10-CM

## 2012-11-30 DIAGNOSIS — Z5112 Encounter for antineoplastic immunotherapy: Secondary | ICD-10-CM

## 2012-11-30 DIAGNOSIS — R300 Dysuria: Secondary | ICD-10-CM

## 2012-11-30 DIAGNOSIS — L539 Erythematous condition, unspecified: Secondary | ICD-10-CM

## 2012-11-30 DIAGNOSIS — C9000 Multiple myeloma not having achieved remission: Secondary | ICD-10-CM

## 2012-11-30 LAB — URINALYSIS, MICROSCOPIC - CHCC
Bilirubin (Urine): NEGATIVE
Blood: NEGATIVE
Ketones: NEGATIVE mg/dL
pH: 7 (ref 4.6–8.0)

## 2012-11-30 LAB — CBC WITH DIFFERENTIAL/PLATELET
Basophils Absolute: 0 10*3/uL (ref 0.0–0.1)
EOS%: 2.4 % (ref 0.0–7.0)
HCT: 37.6 % — ABNORMAL LOW (ref 38.4–49.9)
HGB: 13.2 g/dL (ref 13.0–17.1)
MCH: 30.1 pg (ref 27.2–33.4)
MCV: 86.1 fL (ref 79.3–98.0)
MONO%: 15.4 % — ABNORMAL HIGH (ref 0.0–14.0)
NEUT%: 62 % (ref 39.0–75.0)
RDW: 16.5 % — ABNORMAL HIGH (ref 11.0–14.6)

## 2012-11-30 LAB — COMPREHENSIVE METABOLIC PANEL (CC13)
AST: 20 U/L (ref 5–34)
Alkaline Phosphatase: 52 U/L (ref 40–150)
BUN: 6.6 mg/dL — ABNORMAL LOW (ref 7.0–26.0)
Creatinine: 0.8 mg/dL (ref 0.7–1.3)

## 2012-11-30 MED ORDER — ONDANSETRON HCL 8 MG PO TABS
8.0000 mg | ORAL_TABLET | Freq: Once | ORAL | Status: AC
  Administered 2012-11-30: 8 mg via ORAL

## 2012-11-30 MED ORDER — DEXAMETHASONE 4 MG PO TABS
40.0000 mg | ORAL_TABLET | Freq: Once | ORAL | Status: AC
  Administered 2012-11-30: 40 mg via ORAL

## 2012-11-30 MED ORDER — BORTEZOMIB CHEMO SQ INJECTION 3.5 MG (2.5MG/ML)
1.3000 mg/m2 | Freq: Once | INTRAMUSCULAR | Status: AC
  Administered 2012-11-30: 3 mg via SUBCUTANEOUS
  Filled 2012-11-30: qty 3

## 2012-11-30 NOTE — Addendum Note (Signed)
Addended by: Lonna Cobb K on: 11/30/2012 12:40 PM   Modules accepted: Orders

## 2012-11-30 NOTE — Progress Notes (Signed)
OFFICE PROGRESS NOTE  Interval history:  [name] returns as scheduled. He continues Cytoxan/Velcade/dexamethasone therapy. Most recent IgG level on 11/02/2012 was further improved at 1480; serum M spike also further improved at 1.24.  He continues to have a good appetite. No nausea or vomiting. Stable dyspnea on exertion. Bowels moving with the aid of a laxative. He continues to note "soreness" over the fingertips on the left hand. He denies any numbness in the hands or feet. He notes mild pain at the Velcade injection site from last week.   Objective: There were no vitals taken for this visit.  No thrush or ulcerations. Lungs clear. Regular cardiac rhythm. Abdomen soft and nontender. No hepatomegaly. Mild erythema medial left abdomen just lateral and inferior to the umbilicus. No induration. Trace lower leg edema bilaterally. Vibratory sense is moderately decreased over the fingertips bilaterally.  Lab Results: Lab Results  Component Value Date   WBC 5.5 11/30/2012   HGB 13.2 11/30/2012   HCT 37.6* 11/30/2012   MCV 86.1 11/30/2012   PLT 114* 11/30/2012    Chemistry:    Chemistry      Component Value Date/Time   NA 140 11/02/2012 1054   NA 134* 03/21/2012 0430   K 4.0 11/02/2012 1054   K 3.6 03/21/2012 0430   CL 106 11/02/2012 1054   CL 102 03/21/2012 0430   CO2 24 11/02/2012 1054   CO2 27 03/21/2012 0430   BUN 15.2 11/02/2012 1054   BUN 5* 03/21/2012 0430   CREATININE 1.0 11/02/2012 1054   CREATININE 0.71 03/21/2012 0430      Component Value Date/Time   CALCIUM 8.9 11/02/2012 1054   CALCIUM 8.3* 03/21/2012 0430   ALKPHOS 50 11/02/2012 1054   ALKPHOS 66 03/20/2012 0135   AST 22 11/02/2012 1054   AST 14 03/20/2012 0135   ALT 12 11/02/2012 1054   ALT 15 03/20/2012 0135   BILITOT 0.44 11/02/2012 1054   BILITOT 0.2* 03/20/2012 0135       Studies/Results: No results found.  Medications: I have reviewed the patient's current medications.  Assessment/Plan:  1. Multiple myeloma, status post  treatment with melphalan/prednisone/thalidomide beginning in November 2009. The thalidomide was increased to 200 mg daily beginning 09/11/2008. The serum M spike was slightly improved on 03/18/2009 and slightly increased on 05/09/2009. He has been maintained off of specific therapy for multiple myeloma since September 2010. The serum M spike was increased on 10/20/2011 at 4.6. He began Revlimid on 11/11/2011. He takes weekly dexamethasone. He began cycle 2 of Revlimid on 12/09/2011. The serum M spike was improved on 12/31/2011. The serum M spike was slightly higher on 01/28/2012. He began cycle 4 on 02/03/2012. He developed recurrent hypercalcemia. Treatment was changed to Cytoxan/Velcade/Decadron beginning 03/02/2012. The serum M spike was lower on 02/25/2012. The serum M spike was stable on 06/09/2012. The serum M spike improved on 08/03/2012 and stable on 08/31/2012. The IgG level was stable on 08/31/2012. The IgG level and serum M spike were improved on 10/05/2012. The IgG level and serum M spike further improved on 11/02/2012. 2. Chronic "dizziness." 3. Chest x-ray 11/01/2008 with a patchy opacity at the right midlung suspicious for pneumonia. He was treated with Avelox. 4. Low back pain with a radicular component. An MRI on 10/07/2008 showed no involvement of the lumbosacral spine with myeloma. 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 5. Bilateral neck pain, status post a CT 05/17/2008 with findings  of spinal stenosis and osteoarthritis. 6. 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 Red cell microcytosis, likely related to hemoglobin C trait. A ferritin level returned elevated and a stool Hemoccult was negative 01/23/2010. He underwent a colonoscopy in 2009. 8. Elevated beta-2 microglobulin level. 9. History of hypertension. 10. History of a  herniated disk at L4-L5 on MRI an 01/11/2002. 11. Hyperlipidemia. 12. Gastroesophageal reflux disease. 13. History of atypical angina. 14. History of rectal bleeding-large external hemorrhoids noted 02/25/2012. The stool was Hemoccult positive. He has a history of colon polyps. He is followed by [facility]. He was diagnosed with hemorrhoids in September of 2013 while hospitalized. 15. Superficial venous thrombosis of the greater saphenous vein on a Doppler 09/27/2008. Negative for deep vein thrombosis. Symptoms improved with aspirin. 16. Neck pain with numbness/tingling in the arms, hands, and low back with proximal right leg weakness. An MRI of the cervical spine 05/30/2009 showed multilevel spondylosis with mild cord edema at C4-C5 and C5-C6. He was noted to have an enhancing lesion of the cord at T3-T4 of unclear etiology. In the lumbar spine there was no change in the spondylosis at L3-L4 and L4-L5. There was no involvement of the cervical or lumbar spine with myeloma. He is status post C4-C5, C5-C6, and C6-C7 anterior cervical diskectomy with fusion by Dr. Jordan Likes 08/01/2009. 17. History of mild thrombocytopenia. 18. Indeterminate-age deep vein thrombosis of the left lower extremity on a venous Doppler 08/15/2009. There were also findings consistent with superficial thrombosis involving the right lower extremity 08/15/2009. Coumadin was discontinued in October 2011. 19. Type 2 diabetes. 20. History of hematuria, followed at [facility]. Urinalysis was negative for blood on 07/23/2011. 21. Radicular back pain with MRI of the lumbar spine on 10/19/2011 showing nerve root impingement at L2-L3, L3-L4 and L4-L5 with the most severe at the right L3 nerve roots due to a large disc fragment. Associated right leg weakness. He underwent right L2-3 decompressive laminotomy with right L2 and L3 decompressive foraminotomy; right L2-3 microdiscectomy on 11/01/2011. 22. Constipation He continues a laxative  regimen. 23. Hospitalization 11/08/2011 through 11/10/2011 with orthostatic hypotension/dizziness. He improved with intravenous hydration. He had mild hypotension when here on 11/18/2011. We recommended discontinuing Norvasc and Proscar. 24. Hypercalcemia status post pamidronate 11/03/2011. Recurrent hypercalcemia 02/16/2012 status post Zometa. The calcium has remained in normal range. 25. Fractured tooth with associated pain. Status post a tooth extraction by Dr. Kristin Bruins, he is to be scheduled for multiple tooth extractions in the near future. Zometa has been placed on hold. 26. Exertional dyspnea-etiology unclear. Stable. He reports an upcoming appointment with cardiology. 27. Question Velcade neuropathy with moderate decrease in vibratory sense over the fingertips per tuning fork exam today. 28. Mild erythema medial left abdomen. Question related to recent Velcade injection. He will contact the office with increased erythema, pain or fever.  Disposition-[name] appears stable. The IgG and serum M spike were further improved on 11/02/2012. Plan to continue Cytoxan/Velcade/Decadron on the current schedule. We will continue to monitor for progressive neuropathy symptoms. He will return for a followup visit in one month.  Plan reviewed with Dr. Truett Perna.  Lonna Cobb ANP/GNP-BC

## 2012-11-30 NOTE — Patient Instructions (Addendum)
[facility] Discharge Instructions for Patients Receiving Chemotherapy  Today you received the following chemotherapy agents: Velcade. To help prevent nausea and vomiting after your treatment, we encourage you to take your nausea medication.  If you develop nausea and vomiting that is not controlled by your nausea medication, call the clinic.   BELOW ARE SYMPTOMS THAT SHOULD BE REPORTED IMMEDIATELY:  *FEVER GREATER THAN 100.5 F  *CHILLS WITH OR WITHOUT FEVER  NAUSEA AND VOMITING THAT IS NOT CONTROLLED WITH YOUR NAUSEA MEDICATION  *UNUSUAL SHORTNESS OF BREATH  *UNUSUAL BRUISING OR BLEEDING  TENDERNESS IN MOUTH AND THROAT WITH OR WITHOUT PRESENCE OF ULCERS  *URINARY PROBLEMS  *BOWEL PROBLEMS  UNUSUAL RASH Items with * indicate a potential emergency and should be followed up as soon as possible.  Feel free to call the clinic you have any questions or concerns. The clinic phone number is [phone].    

## 2012-12-05 LAB — PROTEIN ELECTROPHORESIS, SERUM
Alpha-1-Globulin: 4.6 % (ref 2.9–4.9)
Alpha-2-Globulin: 11.2 % (ref 7.1–11.8)
Beta 2: 2.9 % — ABNORMAL LOW (ref 3.2–6.5)
Gamma Globulin: 19.5 % — ABNORMAL HIGH (ref 11.1–18.8)
M-Spike, %: 1.09 g/dL

## 2012-12-06 ENCOUNTER — Encounter: Payer: Self-pay | Admitting: *Deleted

## 2012-12-06 NOTE — Progress Notes (Signed)
Faxed notification from [employer] that cyclophosphamide was shipped 12/05/12 for next day delivery.

## 2012-12-07 ENCOUNTER — Encounter: Payer: Self-pay | Admitting: Physician Assistant

## 2012-12-07 ENCOUNTER — Ambulatory Visit (INDEPENDENT_AMBULATORY_CARE_PROVIDER_SITE_OTHER): Payer: Medicare Other | Admitting: Physician Assistant

## 2012-12-07 VITALS — BP 138/78 | HR 85 | Ht 72.0 in | Wt 256.8 lb

## 2012-12-07 DIAGNOSIS — R0602 Shortness of breath: Secondary | ICD-10-CM

## 2012-12-07 MED ORDER — NITROGLYCERIN 0.4 MG SL SUBL: 0.4000 mg | SUBLINGUAL_TABLET | SUBLINGUAL | Status: DC | PRN

## 2012-12-07 MED ORDER — ASPIRIN 325 MG PO TBEC: 325.0000 mg | DELAYED_RELEASE_TABLET | Freq: Two times a day (BID) | ORAL | Status: DC

## 2012-12-07 NOTE — Patient Instructions (Signed)
Continue all current medications. Nitroglycerin as needed for severe chest pain only - new sent to pharm Your physician wants you to follow up in: 6 months.  You will receive a reminder letter in the mail one-two months in advance.  If you don't receive a letter, please call our office to schedule the follow up appointment

## 2012-12-07 NOTE — Assessment & Plan Note (Signed)
No further workup is currently indicated. After extensive and thorough discussion with the patient and his wife, as well as review of both study results with Dr. Diona Browner, plan is to continue watchful waiting and monitoring for any progression of symptoms. Additionally, patient was reassured that these recent studies are not suggestive of active ischemia, or evidence of prior occult myocardial infarction. He was agreeable with this explanation and understands to contact us in the near future, if he were to develop worsening DOE, or new onset exertional CP. In the meanwhile, he is to continue on full dose aspirin twice a day, which was prescribed by his oncologist, and we will provide him with a prescription for prn NTG. We'll plan on having him return in 6 months for followup with Dr. Diona Browner, or sooner if needed.

## 2012-12-07 NOTE — Progress Notes (Addendum)
Primary Cardiologist: Simona Huh, MD   HPI: Patient presents for discussion of test results, discussion of current symptoms, and determination of whether pursuit of R/L heart catheterization needs to be considered at this time.  Patient was referred here to Dr. Diona Browner on April 28, per Dr. Nash Dimmer request, for evaluation of progressive SOB and intermittent CP. He presented with no known history of CAD or CHF. He had had a negative adenosine Myoview in 2004. Dr. Diona Browner recommended proceeding with a complete noninvasive evaluation, with results as follows:   - Lexiscan Cardiolite, May 5: Low risk stress nuclear study. There is a fixed (looks worse at rest) medium-sized basal to mid inferior and inferoseptal perfusion defect. There is no evidence for ischemia. Suspect prior infarction. EF is preserved. LV Ejection Fraction: 58%. LV Wall Motion: Basal inferior hypokinesis   - Echocardiogram, May 9: LV function normal, without obvious regional wall motion abnormality; Pulmonary artery pressures mildly elevated, but no major valvular abnormalities  Clinically, he reports no change from his baseline exercise tolerance level, since last OV. He denies any frank exertional CP. He states that his DOE has been long lasting, namely ongoing for many years. Of note, he is not able to pinpoint exactly since when his DOE has worsened.  Allergies  Allergen Reactions  . Penicillins Hives and Rash    Current Outpatient Prescriptions  Medication Sig Dispense Refill  . acyclovir (ZOVIRAX) 400 MG tablet Take 1 tablet (400 mg total) by mouth 2 (two) times daily.  60 tablet  2  . amLODipine (NORVASC) 5 MG tablet Take 1 tablet (5 mg total) by mouth daily.  30 tablet  4  . aspirin 325 MG EC tablet Take 1 tablet (325 mg total) by mouth 2 (two) times daily.      Marland Kitchen atorvastatin (LIPITOR) 10 MG tablet Take 1 tablet (10 mg total) by mouth daily.  30 tablet  3  . cyclophosphamide (CYTOXAN) 50 MG tablet Take 12 tablets  (600 mg total) by mouth once a week. Give on an empty stomach 1 hour before or 2 hours after meals.  48 tablet  0  . dexamethasone (DECADRON) 4 MG tablet Take 5 tablets (20 mg total) by mouth as directed. Take 20 mg by mouth on Thursday not getting dose at [facility]  5 tablet  3  . fluticasone (FLONASE) 50 MCG/ACT nasal spray Place 2 sprays into the nose daily.  16 g  6  . KLOR-CON M20 20 MEQ tablet TAKE 1 TABLET EVERY DAY  30 tablet  3  . omeprazole (PRILOSEC) 20 MG capsule Take 1 capsule (20 mg total) by mouth daily.  30 capsule  4  . Polyethylene Glycol 3350 (MIRALAX PO) Take 17 g by mouth every morning.      . prochlorperazine (COMPAZINE) 10 MG tablet TAKE 1 TABLET BY MOUTH EVERY SIX HOURS AS NEEDED  30 tablet  2  . tamsulosin (FLOMAX) 0.4 MG CAPS Take 1 capsule (0.4 mg total) by mouth daily.  30 capsule  4  . zolpidem (AMBIEN) 10 MG tablet Take 0.5 tablets (5 mg total) by mouth at bedtime as needed.  30 tablet  3  . nitroGLYCERIN (NITROSTAT) 0.4 MG SL tablet Place 1 tablet (0.4 mg total) under the tongue every 5 (five) minutes as needed for chest pain.  25 tablet  3   No current facility-administered medications for this visit.    Past Medical History  Diagnosis Date  . GERD (gastroesophageal reflux disease)   .  Essential hypertension, benign   . BPH (benign prostatic hyperplasia)   . Lumbar herniated disc     L4-5  . Hyperlipidemia   . Sleep apnea   . Multiple myeloma 02/19/2008  . Arthritis   . Diverticulosis   . Peptic ulcer disease   . Tubular adenoma 02/16/2008    Dr. Stan Head    Past Surgical History  Procedure Laterality Date  . Neck surgery    . Lumbar laminectomy/decompression microdiscectomy  11/01/2011    Procedure: LUMBAR LAMINECTOMY/DECOMPRESSION MICRODISCECTOMY 2 LEVELS;  Surgeon: Temple Pacini, MD;  Location: [facility];  Service: Neurosurgery;  Laterality: Right;  Lumbar Laminectomy/Microdiscectomy Decompression Lumbar Three-Four, Lumbar Four-Five   .  Colonoscopy      History   Social History  . Marital Status: Married    Spouse Name: N/A    Number of Children: 4  . Years of Education: N/A   Occupational History  . retired-disabled-Construction    Social History Main Topics  . Smoking status: Former Smoker -- 2.00 packs/day for 20 years    Types: Cigarettes    Quit date: 07/12/1990  . Smokeless tobacco: Never Used  . Alcohol Use: No  . Drug Use: No  . Sexually Active: Not on file   Other Topics Concern  . Not on file   Social History Narrative   Pt was adopted.    Family History  Problem Relation Age of Onset  . Adopted: Yes    ROS: no nausea, vomiting; no fever, chills; no melena, hematochezia; no claudication  PHYSICAL EXAM: BP 138/78  Pulse 85  Ht 6' (1.829 m)  Wt 256 lb 12.8 oz (116.484 kg)  BMI 34.82 kg/m2  SpO2 95% GENERAL: 77 year-old male, moderately obese; NAD HEENT: NCAT, PERRLA, EOMI; sclera clear; no xanthelasma NECK: palpable bilateral carotid pulses, no bruits; no JVD; no TM LUNGS: CTA bilaterally CARDIAC: RRR (S1, S2); no significant murmurs; no rubs or gallops ABDOMEN: soft, non-tender; intact BS EXTREMETIES: no significant peripheral edema SKIN: warm/dry; no obvious rash/lesions MUSCULOSKELETAL: no joint deformity NEURO: no focal deficit; NL affect   EKG:    ASSESSMENT & PLAN:  Shortness of breath No further workup is currently indicated. After extensive and thorough discussion with the patient and his wife, as well as review of both study results with Dr. Diona Browner, plan is to continue watchful waiting and monitoring for any progression of symptoms. Additionally, patient was reassured that these recent studies are not suggestive of active ischemia, or evidence of prior occult myocardial infarction. He was agreeable with this explanation and understands to contact us in the near future, if he were to develop worsening DOE, or new onset exertional CP. In the meanwhile, he is to continue  on full dose aspirin twice a day, which was prescribed by his oncologist, and we will provide him with a prescription for prn NTG. We'll plan on having him return in 6 months for followup with Dr. Diona Browner, or sooner if needed.    Gene Delyle Weider, PAC

## 2012-12-12 ENCOUNTER — Telehealth: Payer: Self-pay | Admitting: Family Medicine

## 2012-12-13 ENCOUNTER — Other Ambulatory Visit: Payer: Self-pay | Admitting: Family Medicine

## 2012-12-14 ENCOUNTER — Other Ambulatory Visit: Payer: Self-pay | Admitting: Oncology

## 2012-12-14 ENCOUNTER — Other Ambulatory Visit (HOSPITAL_BASED_OUTPATIENT_CLINIC_OR_DEPARTMENT_OTHER): Payer: Medicare Other | Admitting: Lab

## 2012-12-14 ENCOUNTER — Ambulatory Visit (HOSPITAL_BASED_OUTPATIENT_CLINIC_OR_DEPARTMENT_OTHER): Payer: Medicare Other

## 2012-12-14 ENCOUNTER — Telehealth: Payer: Self-pay | Admitting: Family Medicine

## 2012-12-14 VITALS — BP 111/65 | HR 85 | Temp 98.4°F | Resp 20

## 2012-12-14 DIAGNOSIS — Z5112 Encounter for antineoplastic immunotherapy: Secondary | ICD-10-CM

## 2012-12-14 DIAGNOSIS — C9000 Multiple myeloma not having achieved remission: Secondary | ICD-10-CM

## 2012-12-14 LAB — CBC WITH DIFFERENTIAL/PLATELET
BASO%: 0.6 % (ref 0.0–2.0)
EOS%: 1.8 % (ref 0.0–7.0)
HCT: 36.4 % — ABNORMAL LOW (ref 38.4–49.9)
LYMPH%: 26.6 % (ref 14.0–49.0)
MCH: 29.4 pg (ref 27.2–33.4)
MCHC: 35.7 g/dL (ref 32.0–36.0)
MONO#: 0.6 10*3/uL (ref 0.1–0.9)
NEUT%: 59.4 % (ref 39.0–75.0)
Platelets: 162 10*3/uL (ref 140–400)

## 2012-12-14 MED ORDER — DEXAMETHASONE 4 MG PO TABS
40.0000 mg | ORAL_TABLET | Freq: Once | ORAL | Status: AC
  Administered 2012-12-14: 40 mg via ORAL

## 2012-12-14 MED ORDER — ONDANSETRON HCL 8 MG PO TABS
8.0000 mg | ORAL_TABLET | Freq: Once | ORAL | Status: AC
  Administered 2012-12-14: 8 mg via ORAL

## 2012-12-14 MED ORDER — BORTEZOMIB CHEMO SQ INJECTION 3.5 MG (2.5MG/ML)
1.3000 mg/m2 | Freq: Once | INTRAMUSCULAR | Status: AC
  Administered 2012-12-14: 3 mg via SUBCUTANEOUS
  Filled 2012-12-14: qty 3

## 2012-12-14 NOTE — Patient Instructions (Addendum)
[facility] Discharge Instructions for Patients Receiving Chemotherapy  Today you received the following chemotherapy agents: Velcade.  To help prevent nausea and vomiting after your treatment, we encourage you to take your nausea medication as prescribed.   If you develop nausea and vomiting that is not controlled by your nausea medication, call the clinic.   BELOW ARE SYMPTOMS THAT SHOULD BE REPORTED IMMEDIATELY:  *FEVER GREATER THAN 100.5 F  *CHILLS WITH OR WITHOUT FEVER  NAUSEA AND VOMITING THAT IS NOT CONTROLLED WITH YOUR NAUSEA MEDICATION  *UNUSUAL SHORTNESS OF BREATH  *UNUSUAL BRUISING OR BLEEDING  TENDERNESS IN MOUTH AND THROAT WITH OR WITHOUT PRESENCE OF ULCERS  *URINARY PROBLEMS  *BOWEL PROBLEMS  UNUSUAL RASH Items with * indicate a potential emergency and should be followed up as soon as possible.  Feel free to call the clinic you have any questions or concerns. The clinic phone number is [phone].    

## 2012-12-15 ENCOUNTER — Telehealth: Payer: Self-pay | Admitting: Oncology

## 2012-12-15 NOTE — Telephone Encounter (Signed)
Per pof sent for [phone] move [name] to [name] same day. pof sent on 6/6 by LT. S/w pt wife re changes and new time for 6/19 @ 11:15am - lb/fu/tx. Pt to get new schedule 6/12.

## 2012-12-15 NOTE — Telephone Encounter (Signed)
Pt rx writtten 3/14 for Tamulosin for QD. Needs BID per pt. Please review. Thanks

## 2012-12-18 ENCOUNTER — Other Ambulatory Visit: Payer: Self-pay | Admitting: Family Medicine

## 2012-12-18 DIAGNOSIS — N400 Enlarged prostate without lower urinary tract symptoms: Secondary | ICD-10-CM

## 2012-12-18 MED ORDER — TAMSULOSIN HCL 0.4 MG PO CAPS: 0.4000 mg | ORAL_CAPSULE | Freq: Two times a day (BID) | ORAL | Status: DC

## 2012-12-18 NOTE — Telephone Encounter (Signed)
Spoke with pt takes tamouslin bid and rx can be called to [facility]

## 2012-12-18 NOTE — Telephone Encounter (Signed)
Pt aware.

## 2012-12-18 NOTE — Telephone Encounter (Signed)
Sent through [employer]. One twice a  Day.

## 2012-12-21 ENCOUNTER — Ambulatory Visit: Payer: Medicare Other | Admitting: Family Medicine

## 2012-12-21 ENCOUNTER — Other Ambulatory Visit: Payer: Self-pay | Admitting: Oncology

## 2012-12-21 ENCOUNTER — Other Ambulatory Visit (HOSPITAL_BASED_OUTPATIENT_CLINIC_OR_DEPARTMENT_OTHER): Payer: Medicare Other | Admitting: Lab

## 2012-12-21 ENCOUNTER — Ambulatory Visit (HOSPITAL_BASED_OUTPATIENT_CLINIC_OR_DEPARTMENT_OTHER): Payer: Medicare Other

## 2012-12-21 VITALS — BP 105/65 | HR 75 | Temp 97.5°F | Resp 18

## 2012-12-21 DIAGNOSIS — Z5112 Encounter for antineoplastic immunotherapy: Secondary | ICD-10-CM

## 2012-12-21 DIAGNOSIS — C9000 Multiple myeloma not having achieved remission: Secondary | ICD-10-CM

## 2012-12-21 LAB — CBC WITH DIFFERENTIAL/PLATELET
BASO%: 0.8 % (ref 0.0–2.0)
EOS%: 1.5 % (ref 0.0–7.0)
HCT: 39.1 % (ref 38.4–49.9)
MCH: 30.4 pg (ref 27.2–33.4)
MCHC: 35.2 g/dL (ref 32.0–36.0)
MCV: 86.4 fL (ref 79.3–98.0)
MONO%: 18.7 % — ABNORMAL HIGH (ref 0.0–14.0)
NEUT%: 61.9 % (ref 39.0–75.0)
lymph#: 0.9 10*3/uL (ref 0.9–3.3)

## 2012-12-21 MED ORDER — ONDANSETRON HCL 8 MG PO TABS
8.0000 mg | ORAL_TABLET | Freq: Once | ORAL | Status: AC
  Administered 2012-12-21: 8 mg via ORAL

## 2012-12-21 MED ORDER — BORTEZOMIB CHEMO SQ INJECTION 3.5 MG (2.5MG/ML)
1.3000 mg/m2 | Freq: Once | INTRAMUSCULAR | Status: AC
  Administered 2012-12-21: 3 mg via SUBCUTANEOUS
  Filled 2012-12-21: qty 1.2

## 2012-12-21 MED ORDER — DEXAMETHASONE 4 MG PO TABS
40.0000 mg | ORAL_TABLET | Freq: Once | ORAL | Status: AC
  Administered 2012-12-21: 40 mg via ORAL

## 2012-12-21 NOTE — Patient Instructions (Addendum)
[facility] Discharge Instructions for Patients Receiving Chemotherapy  Today you received the following chemotherapy agents: Velcade. To help prevent nausea and vomiting after your treatment, we encourage you to take your nausea medication.  If you develop nausea and vomiting that is not controlled by your nausea medication, call the clinic.   BELOW ARE SYMPTOMS THAT SHOULD BE REPORTED IMMEDIATELY:  *FEVER GREATER THAN 100.5 F  *CHILLS WITH OR WITHOUT FEVER  NAUSEA AND VOMITING THAT IS NOT CONTROLLED WITH YOUR NAUSEA MEDICATION  *UNUSUAL SHORTNESS OF BREATH  *UNUSUAL BRUISING OR BLEEDING  TENDERNESS IN MOUTH AND THROAT WITH OR WITHOUT PRESENCE OF ULCERS  *URINARY PROBLEMS  *BOWEL PROBLEMS  UNUSUAL RASH Items with * indicate a potential emergency and should be followed up as soon as possible.  Feel free to call the clinic you have any questions or concerns. The clinic phone number is [phone].    

## 2012-12-24 ENCOUNTER — Other Ambulatory Visit: Payer: Self-pay | Admitting: Oncology

## 2012-12-26 ENCOUNTER — Other Ambulatory Visit: Payer: Self-pay | Admitting: Certified Registered Nurse Anesthetist

## 2012-12-27 ENCOUNTER — Other Ambulatory Visit: Payer: Self-pay | Admitting: Oncology

## 2012-12-28 ENCOUNTER — Ambulatory Visit: Payer: Self-pay

## 2012-12-28 ENCOUNTER — Ambulatory Visit (HOSPITAL_BASED_OUTPATIENT_CLINIC_OR_DEPARTMENT_OTHER): Payer: Medicare Other | Admitting: Physician Assistant

## 2012-12-28 ENCOUNTER — Encounter: Payer: Self-pay | Admitting: Physician Assistant

## 2012-12-28 ENCOUNTER — Telehealth: Payer: Self-pay | Admitting: *Deleted

## 2012-12-28 ENCOUNTER — Other Ambulatory Visit (HOSPITAL_BASED_OUTPATIENT_CLINIC_OR_DEPARTMENT_OTHER): Payer: Medicare Other | Admitting: Lab

## 2012-12-28 ENCOUNTER — Other Ambulatory Visit: Payer: Self-pay | Admitting: Oncology

## 2012-12-28 ENCOUNTER — Ambulatory Visit (HOSPITAL_BASED_OUTPATIENT_CLINIC_OR_DEPARTMENT_OTHER): Payer: Medicare Other

## 2012-12-28 ENCOUNTER — Telehealth: Payer: Self-pay | Admitting: Oncology

## 2012-12-28 ENCOUNTER — Other Ambulatory Visit: Payer: Self-pay | Admitting: *Deleted

## 2012-12-28 VITALS — BP 134/69 | HR 86 | Temp 97.0°F | Resp 18 | Ht 72.0 in | Wt 255.5 lb

## 2012-12-28 DIAGNOSIS — G5690 Unspecified mononeuropathy of unspecified upper limb: Secondary | ICD-10-CM

## 2012-12-28 DIAGNOSIS — C9000 Multiple myeloma not having achieved remission: Secondary | ICD-10-CM

## 2012-12-28 DIAGNOSIS — Z5112 Encounter for antineoplastic immunotherapy: Secondary | ICD-10-CM

## 2012-12-28 DIAGNOSIS — D6481 Anemia due to antineoplastic chemotherapy: Secondary | ICD-10-CM

## 2012-12-28 DIAGNOSIS — L539 Erythematous condition, unspecified: Secondary | ICD-10-CM

## 2012-12-28 LAB — COMPREHENSIVE METABOLIC PANEL (CC13)
AST: 20 U/L (ref 5–34)
Albumin: 3.1 g/dL — ABNORMAL LOW (ref 3.5–5.0)
Alkaline Phosphatase: 56 U/L (ref 40–150)
BUN: 15.1 mg/dL (ref 7.0–26.0)
Potassium: 4 mEq/L (ref 3.5–5.1)
Sodium: 138 mEq/L (ref 136–145)

## 2012-12-28 LAB — CBC WITH DIFFERENTIAL/PLATELET
BASO%: 1 % (ref 0.0–2.0)
EOS%: 1.3 % (ref 0.0–7.0)
MCH: 30.4 pg (ref 27.2–33.4)
MCHC: 35.3 g/dL (ref 32.0–36.0)
MCV: 86 fL (ref 79.3–98.0)
MONO%: 15.9 % — ABNORMAL HIGH (ref 0.0–14.0)
RBC: 4.44 10*6/uL (ref 4.20–5.82)
RDW: 16.7 % — ABNORMAL HIGH (ref 11.0–14.6)

## 2012-12-28 MED ORDER — CYCLOPHOSPHAMIDE 50 MG PO TABS: 600.0000 mg | ORAL_TABLET | ORAL | Status: DC

## 2012-12-28 MED ORDER — BORTEZOMIB CHEMO SQ INJECTION 3.5 MG (2.5MG/ML)
1.3000 mg/m2 | Freq: Once | INTRAMUSCULAR | Status: AC
  Administered 2012-12-28: 3 mg via SUBCUTANEOUS
  Filled 2012-12-28: qty 3

## 2012-12-28 MED ORDER — DEXAMETHASONE 4 MG PO TABS
40.0000 mg | ORAL_TABLET | Freq: Once | ORAL | Status: AC
  Administered 2012-12-28: 40 mg via ORAL

## 2012-12-28 MED ORDER — ONDANSETRON HCL 8 MG PO TABS
8.0000 mg | ORAL_TABLET | Freq: Once | ORAL | Status: AC
  Administered 2012-12-28: 8 mg via ORAL

## 2012-12-28 NOTE — Patient Instructions (Addendum)
Continue with your labs and chemotherapy as scheduled Followup with Lonna Cobb, nurse practitioner in 4 weeks for a symptom management visit

## 2012-12-28 NOTE — Telephone Encounter (Signed)
Gave pt appr for lab , chemo and MD for July 2014

## 2012-12-28 NOTE — Telephone Encounter (Signed)
Per staff message and POF I have scheduled appts.  JMW  

## 2012-12-28 NOTE — Patient Instructions (Signed)
[facility] Discharge Instructions for Patients Receiving Chemotherapy  Today you received the following chemotherapy agents velcade.  To help prevent nausea and vomiting after your treatment, we encourage you to take your nausea medication.   If you develop nausea and vomiting that is not controlled by your nausea medication, call the clinic.   BELOW ARE SYMPTOMS THAT SHOULD BE REPORTED IMMEDIATELY:  *FEVER GREATER THAN 100.5 F  *CHILLS WITH OR WITHOUT FEVER  NAUSEA AND VOMITING THAT IS NOT CONTROLLED WITH YOUR NAUSEA MEDICATION  *UNUSUAL SHORTNESS OF BREATH  *UNUSUAL BRUISING OR BLEEDING  TENDERNESS IN MOUTH AND THROAT WITH OR WITHOUT PRESENCE OF ULCERS  *URINARY PROBLEMS  *BOWEL PROBLEMS  UNUSUAL RASH Items with * indicate a potential emergency and should be followed up as soon as possible.  Feel free to call the clinic you have any questions or concerns. The clinic phone number is [phone].    

## 2012-12-29 NOTE — Progress Notes (Signed)
OFFICE PROGRESS NOTE  Interval history:  [name] returns as scheduled. He continues Cytoxan/Velcade/dexamethasone therapy. Most recent IgG level on 11/30/2012 was further improved at 1190; serum M spike also further improved at 1.09.  He continues to have a good appetite. No nausea or vomiting. Stable dyspnea on exertion. He does however complain of feeling tired and sluggish. He does have some issues with constipation but gets good results using MiraLAX and Metamucil. Bowels moving with the aid of a laxative. He denies any numbness in the hands or feet. Overall he is tolerating his chemotherapy with Cytoxan, Velcade and dexamethasone relatively well.   Objective: Blood pressure 134/69, pulse 86, temperature 97 F (36.1 C), temperature source Oral, resp. rate 18, height 6' (1.829 m), weight 255 lb 8 oz (115.894 kg).  No thrush, mucositis or ulcerations. Lungs clear. Regular cardiac rhythm. Abdomen soft and nontender. No hepatomegaly. Bowel sounds present normally active in all quadrants. No induration. Trace lower leg edema bilaterally. Vibratory sense is moderately decreased over the fingertips bilaterally.  Lab Results: Lab Results  Component Value Date   WBC 6.6 12/28/2012   HGB 13.5 12/28/2012   HCT 38.2* 12/28/2012   MCV 86.0 12/28/2012   PLT 117* 12/28/2012    Chemistry:    Chemistry      Component Value Date/Time   NA 138 12/28/2012 1059   NA 134* 03/21/2012 0430   K 4.0 12/28/2012 1059   K 3.6 03/21/2012 0430   CL 106 12/28/2012 1059   CL 102 03/21/2012 0430   CO2 23 12/28/2012 1059   CO2 27 03/21/2012 0430   BUN 15.1 12/28/2012 1059   BUN 5* 03/21/2012 0430   CREATININE 0.9 12/28/2012 1059   CREATININE 0.71 03/21/2012 0430      Component Value Date/Time   CALCIUM 9.1 12/28/2012 1059   CALCIUM 8.3* 03/21/2012 0430   ALKPHOS 56 12/28/2012 1059   ALKPHOS 66 03/20/2012 0135   AST 20 12/28/2012 1059   AST 14 03/20/2012 0135   ALT 10 12/28/2012 1059   ALT 15 03/20/2012 0135   BILITOT 0.56  12/28/2012 1059   BILITOT 0.2* 03/20/2012 0135       Studies/Results: No results found.  Medications: I have reviewed the patient's current medications.  Assessment/Plan:  1. Multiple myeloma, status post treatment with melphalan/prednisone/thalidomide beginning in November 2009. The thalidomide was increased to 200 mg daily beginning 09/11/2008. The serum M spike was slightly improved on 03/18/2009 and slightly increased on 05/09/2009. He has been maintained off of specific therapy for multiple myeloma since September 2010. The serum M spike was increased on 10/20/2011 at 4.6. He began Revlimid on 11/11/2011. He takes weekly dexamethasone. He began cycle 2 of Revlimid on 12/09/2011. The serum M spike was improved on 12/31/2011. The serum M spike was slightly higher on 01/28/2012. He began cycle 4 on 02/03/2012. He developed recurrent hypercalcemia. Treatment was changed to Cytoxan/Velcade/Decadron beginning 03/02/2012. The serum M spike was lower on 02/25/2012. The serum M spike was stable on 06/09/2012. The serum M spike improved on 08/03/2012 and stable on 08/31/2012. The IgG level was stable on 08/31/2012. The IgG level and serum M spike were improved on 10/05/2012. The IgG level and serum M spike further improved on 11/30/2012. 2. Chronic "dizziness." 3. Chest x-ray 11/01/2008 with a patchy opacity at the right midlung suspicious for pneumonia. He was treated with Avelox. 4. Low back pain with a radicular component. An MRI on 10/07/2008 showed no involvement of the lumbosacral spine with myeloma.  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 5. Bilateral neck pain, status post a CT 05/17/2008 with findings of spinal stenosis and osteoarthritis. 6. 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 Red cell microcytosis, likely related to hemoglobin C  trait. A ferritin level returned elevated and a stool Hemoccult was negative 01/23/2010. He underwent a colonoscopy in 2009. 8. Elevated beta-2 microglobulin level. 9. History of hypertension. 10. History of a herniated disk at L4-L5 on MRI an 01/11/2002. 11. Hyperlipidemia. 12. Gastroesophageal reflux disease. 13. History of atypical angina. 14. History of rectal bleeding-large external hemorrhoids noted 02/25/2012. The stool was Hemoccult positive. He has a history of colon polyps. He is followed by [facility]. He was diagnosed with hemorrhoids in September of 2013 while hospitalized. 15. Superficial venous thrombosis of the greater saphenous vein on a Doppler 09/27/2008. Negative for deep vein thrombosis. Symptoms improved with aspirin. 16. Neck pain with numbness/tingling in the arms, hands, and low back with proximal right leg weakness. An MRI of the cervical spine 05/30/2009 showed multilevel spondylosis with mild cord edema at C4-C5 and C5-C6. He was noted to have an enhancing lesion of the cord at T3-T4 of unclear etiology. In the lumbar spine there was no change in the spondylosis at L3-L4 and L4-L5. There was no involvement of the cervical or lumbar spine with myeloma. He is status post C4-C5, C5-C6, and C6-C7 anterior cervical diskectomy with fusion by Dr. Jordan [name] 08/01/2009. 17. History of mild thrombocytopenia. 18. Indeterminate-age deep vein thrombosis of the left lower extremity on a venous Doppler 08/15/2009. There were also findings consistent with superficial thrombosis involving the right lower extremity 08/15/2009. Coumadin was discontinued in October 2011. 19. Type 2 diabetes. 20. History of hematuria, followed at [facility]. Urinalysis was negative for blood on 07/23/2011. 21. Radicular back pain with MRI of the lumbar spine on 10/19/2011 showing nerve root impingement at L2-L3, L3-L4 and L4-L5 with the most severe at the right L3 nerve roots due to a large disc fragment.  Associated right leg weakness. He underwent right L2-3 decompressive laminotomy with right L2 and L3 decompressive foraminotomy; right L2-3 microdiscectomy on 11/01/2011. 22. Constipation He continues a laxative regimen. 23. Hospitalization 11/08/2011 through 11/10/2011 with orthostatic hypotension/dizziness. He improved with intravenous hydration. He had mild hypotension when here on 11/18/2011. We recommended discontinuing Norvasc and Proscar. 24. Hypercalcemia status post pamidronate 11/03/2011. Recurrent hypercalcemia 02/16/2012 status post Zometa. The calcium has remained in normal range. 25. Fractured tooth with associated pain. Status post a tooth extraction by Dr. Kristin Bruins, he is to be scheduled for multiple tooth extractions in the near future. Zometa has been placed on hold. 26. Exertional dyspnea-etiology unclear. Stable. He reports an upcoming appointment with cardiology. 27. Question Velcade neuropathy with moderate decrease in vibratory sense over the fingertips per tuning fork exam today. 28. Mild erythema medial left abdomen. Question related to recent Velcade injection. He will contact the office with increased erythema, pain or fever.  Disposition-Mr. Shamblin appears stable. The IgG and serum M spike were further improved on 11/30/2012. Plan to continue Cytoxan/Velcade/Decadron on the current schedule. We will continue to monitor for progressive neuropathy symptoms. He will return for a followup visit in one month.  Plan reviewed with Dr. Truett Perna.  Conni Slipper, PA_C

## 2012-12-29 NOTE — Telephone Encounter (Signed)
Received fax from [facility] that rx has been received and is being processed.  SLJ 

## 2013-01-01 LAB — IGG: IgG (Immunoglobin G), Serum: 1180 mg/dL (ref 650–1600)

## 2013-01-01 LAB — PROTEIN ELECTROPHORESIS, SERUM
Beta Globulin: 5.6 % (ref 4.7–7.2)
M-Spike, %: 0.99 g/dL
Total Protein, Serum Electrophoresis: 6.5 g/dL (ref 6.0–8.3)

## 2013-01-04 NOTE — Telephone Encounter (Signed)
[facility] sent facsimile confirmation of Cyclophasphamide prescription shipment.  Was shipped 01-01-2013 with next business day delivery.

## 2013-01-09 ENCOUNTER — Other Ambulatory Visit: Payer: Self-pay | Admitting: *Deleted

## 2013-01-09 DIAGNOSIS — C9000 Multiple myeloma not having achieved remission: Secondary | ICD-10-CM

## 2013-01-10 ENCOUNTER — Other Ambulatory Visit: Payer: Self-pay | Admitting: Oncology

## 2013-01-10 ENCOUNTER — Other Ambulatory Visit: Payer: Self-pay | Admitting: Internal Medicine

## 2013-01-10 ENCOUNTER — Other Ambulatory Visit (HOSPITAL_BASED_OUTPATIENT_CLINIC_OR_DEPARTMENT_OTHER): Payer: Medicare Other | Admitting: Lab

## 2013-01-10 ENCOUNTER — Ambulatory Visit (HOSPITAL_BASED_OUTPATIENT_CLINIC_OR_DEPARTMENT_OTHER): Payer: Medicare Other

## 2013-01-10 VITALS — BP 139/68 | HR 88 | Temp 97.0°F

## 2013-01-10 DIAGNOSIS — C9000 Multiple myeloma not having achieved remission: Secondary | ICD-10-CM

## 2013-01-10 DIAGNOSIS — Z5112 Encounter for antineoplastic immunotherapy: Secondary | ICD-10-CM

## 2013-01-10 LAB — CBC WITH DIFFERENTIAL/PLATELET
BASO%: 0.8 % (ref 0.0–2.0)
Eosinophils Absolute: 0.1 10*3/uL (ref 0.0–0.5)
LYMPH%: 16 % (ref 14.0–49.0)
MCHC: 34.6 g/dL (ref 32.0–36.0)
MCV: 86.8 fL (ref 79.3–98.0)
MONO#: 0.9 10*3/uL (ref 0.1–0.9)
MONO%: 14.9 % — ABNORMAL HIGH (ref 0.0–14.0)
NEUT#: 3.8 10*3/uL (ref 1.5–6.5)
Platelets: 142 10*3/uL (ref 140–400)
RBC: 4.48 10*6/uL (ref 4.20–5.82)
RDW: 17 % — ABNORMAL HIGH (ref 11.0–14.6)
WBC: 5.8 10*3/uL (ref 4.0–10.3)

## 2013-01-10 MED ORDER — BORTEZOMIB CHEMO SQ INJECTION 3.5 MG (2.5MG/ML)
1.3000 mg/m2 | Freq: Once | INTRAMUSCULAR | Status: AC
  Administered 2013-01-10: 3 mg via SUBCUTANEOUS
  Filled 2013-01-10: qty 3

## 2013-01-10 MED ORDER — ONDANSETRON HCL 8 MG PO TABS
8.0000 mg | ORAL_TABLET | Freq: Once | ORAL | Status: AC
  Administered 2013-01-10: 8 mg via ORAL

## 2013-01-10 MED ORDER — DEXAMETHASONE 4 MG PO TABS
40.0000 mg | ORAL_TABLET | Freq: Once | ORAL | Status: AC
  Administered 2013-01-10: 40 mg via ORAL

## 2013-01-10 NOTE — Patient Instructions (Addendum)
[facility] Discharge Instructions for Patients Receiving Chemotherapy  Today you received the following chemotherapy agents:  Velcade  To help prevent nausea and vomiting after your treatment, we encourage you to take your nausea medication as ordered per MD.   If you develop nausea and vomiting that is not controlled by your nausea medication, call the clinic.   BELOW ARE SYMPTOMS THAT SHOULD BE REPORTED IMMEDIATELY:  *FEVER GREATER THAN 100.5 F  *CHILLS WITH OR WITHOUT FEVER  NAUSEA AND VOMITING THAT IS NOT CONTROLLED WITH YOUR NAUSEA MEDICATION  *UNUSUAL SHORTNESS OF BREATH  *UNUSUAL BRUISING OR BLEEDING  TENDERNESS IN MOUTH AND THROAT WITH OR WITHOUT PRESENCE OF ULCERS  *URINARY PROBLEMS  *BOWEL PROBLEMS  UNUSUAL RASH Items with * indicate a potential emergency and should be followed up as soon as possible.  Feel free to call the clinic you have any questions or concerns. The clinic phone number is [phone].    

## 2013-01-15 ENCOUNTER — Other Ambulatory Visit: Payer: Self-pay | Admitting: Oncology

## 2013-01-17 ENCOUNTER — Other Ambulatory Visit: Payer: Self-pay | Admitting: *Deleted

## 2013-01-17 DIAGNOSIS — C9000 Multiple myeloma not having achieved remission: Secondary | ICD-10-CM

## 2013-01-18 ENCOUNTER — Ambulatory Visit (HOSPITAL_BASED_OUTPATIENT_CLINIC_OR_DEPARTMENT_OTHER): Payer: Medicare Other

## 2013-01-18 ENCOUNTER — Other Ambulatory Visit (HOSPITAL_BASED_OUTPATIENT_CLINIC_OR_DEPARTMENT_OTHER): Payer: Medicare Other | Admitting: Lab

## 2013-01-18 ENCOUNTER — Other Ambulatory Visit: Payer: Self-pay | Admitting: Oncology

## 2013-01-18 VITALS — BP 120/76 | HR 88 | Temp 98.2°F | Resp 20

## 2013-01-18 DIAGNOSIS — Z5112 Encounter for antineoplastic immunotherapy: Secondary | ICD-10-CM

## 2013-01-18 DIAGNOSIS — C9000 Multiple myeloma not having achieved remission: Secondary | ICD-10-CM

## 2013-01-18 LAB — CBC WITH DIFFERENTIAL/PLATELET
BASO%: 0.5 % (ref 0.0–2.0)
Eosinophils Absolute: 0.1 10*3/uL (ref 0.0–0.5)
MCHC: 34.9 g/dL (ref 32.0–36.0)
MONO#: 1.1 10*3/uL — ABNORMAL HIGH (ref 0.1–0.9)
NEUT#: 3.6 10*3/uL (ref 1.5–6.5)
RBC: 4.38 10*6/uL (ref 4.20–5.82)
WBC: 5.9 10*3/uL (ref 4.0–10.3)
lymph#: 1 10*3/uL (ref 0.9–3.3)

## 2013-01-18 MED ORDER — BORTEZOMIB CHEMO SQ INJECTION 3.5 MG (2.5MG/ML)
1.3000 mg/m2 | Freq: Once | INTRAMUSCULAR | Status: AC
  Administered 2013-01-18: 3 mg via SUBCUTANEOUS
  Filled 2013-01-18: qty 3

## 2013-01-18 MED ORDER — DEXAMETHASONE 4 MG PO TABS
40.0000 mg | ORAL_TABLET | Freq: Once | ORAL | Status: AC
  Administered 2013-01-18: 40 mg via ORAL

## 2013-01-18 MED ORDER — ONDANSETRON HCL 8 MG PO TABS
8.0000 mg | ORAL_TABLET | Freq: Once | ORAL | Status: AC
  Administered 2013-01-18: 8 mg via ORAL

## 2013-01-18 NOTE — Patient Instructions (Signed)
[facility] Discharge Instructions for Patients Receiving Chemotherapy  Today you received the following chemotherapy agents: velcade  To help prevent nausea and vomiting after your treatment, we encourage you to take your nausea medication.  Take it as often as prescribed.     If you develop nausea and vomiting that is not controlled by your nausea medication, call the clinic. If it is after clinic hours your family physician or the after hours number for the clinic or go to the Emergency Department.   BELOW ARE SYMPTOMS THAT SHOULD BE REPORTED IMMEDIATELY:  *FEVER GREATER THAN 100.5 F  *CHILLS WITH OR WITHOUT FEVER  NAUSEA AND VOMITING THAT IS NOT CONTROLLED WITH YOUR NAUSEA MEDICATION  *UNUSUAL SHORTNESS OF BREATH  *UNUSUAL BRUISING OR BLEEDING  TENDERNESS IN MOUTH AND THROAT WITH OR WITHOUT PRESENCE OF ULCERS  *URINARY PROBLEMS  *BOWEL PROBLEMS  UNUSUAL RASH Items with * indicate a potential emergency and should be followed up as soon as possible.  Feel free to call the clinic you have any questions or concerns. The clinic phone number is [phone].   I have been informed and understand all the instructions given to me. I know to contact the clinic, my physician, or go to the Emergency Department if any problems should occur. I do not have any questions at this time, but understand that I may call the clinic during office hours   should I have any questions or need assistance in obtaining follow up care.    __________________________________________  _____________  __________ Signature of Patient or Authorized Representative            Date                   Time    __________________________________________ Nurse's Signature    

## 2013-01-21 ENCOUNTER — Other Ambulatory Visit: Payer: Self-pay | Admitting: Oncology

## 2013-01-24 ENCOUNTER — Telehealth: Payer: Self-pay | Admitting: *Deleted

## 2013-01-24 NOTE — Telephone Encounter (Signed)
[facility] faxed Cyclosphamide refill request.  Request to provider for review.

## 2013-01-25 ENCOUNTER — Other Ambulatory Visit: Payer: Self-pay | Admitting: Family Medicine

## 2013-01-25 ENCOUNTER — Telehealth: Payer: Self-pay | Admitting: *Deleted

## 2013-01-25 ENCOUNTER — Telehealth: Payer: Self-pay | Admitting: Oncology

## 2013-01-25 ENCOUNTER — Other Ambulatory Visit (HOSPITAL_BASED_OUTPATIENT_CLINIC_OR_DEPARTMENT_OTHER): Payer: Medicare Other | Admitting: Lab

## 2013-01-25 ENCOUNTER — Ambulatory Visit (HOSPITAL_BASED_OUTPATIENT_CLINIC_OR_DEPARTMENT_OTHER): Payer: Medicare Other | Admitting: Nurse Practitioner

## 2013-01-25 ENCOUNTER — Other Ambulatory Visit: Payer: Self-pay | Admitting: *Deleted

## 2013-01-25 ENCOUNTER — Ambulatory Visit (HOSPITAL_BASED_OUTPATIENT_CLINIC_OR_DEPARTMENT_OTHER): Payer: Medicare Other

## 2013-01-25 VITALS — BP 118/77 | HR 74 | Temp 97.0°F | Resp 20 | Ht 72.0 in | Wt 262.9 lb

## 2013-01-25 DIAGNOSIS — D6481 Anemia due to antineoplastic chemotherapy: Secondary | ICD-10-CM

## 2013-01-25 DIAGNOSIS — C9000 Multiple myeloma not having achieved remission: Secondary | ICD-10-CM

## 2013-01-25 DIAGNOSIS — Z5112 Encounter for antineoplastic immunotherapy: Secondary | ICD-10-CM

## 2013-01-25 LAB — CBC WITH DIFFERENTIAL/PLATELET
BASO%: 0.3 % (ref 0.0–2.0)
Basophils Absolute: 0 10*3/uL (ref 0.0–0.1)
EOS%: 1.8 % (ref 0.0–7.0)
HCT: 38.4 % (ref 38.4–49.9)
HGB: 13.5 g/dL (ref 13.0–17.1)
LYMPH%: 18.8 % (ref 14.0–49.0)
MCH: 30.1 pg (ref 27.2–33.4)
MCHC: 35 g/dL (ref 32.0–36.0)
MCV: 86 fL (ref 79.3–98.0)
MONO%: 17.8 % — ABNORMAL HIGH (ref 0.0–14.0)
NEUT%: 61.3 % (ref 39.0–75.0)
Platelets: 111 10*3/uL — ABNORMAL LOW (ref 140–400)

## 2013-01-25 MED ORDER — DEXAMETHASONE 4 MG PO TABS: 20.0000 mg | ORAL_TABLET | ORAL | Status: DC

## 2013-01-25 MED ORDER — DEXAMETHASONE 4 MG PO TABS
40.0000 mg | ORAL_TABLET | Freq: Once | ORAL | Status: AC
  Administered 2013-01-25: 40 mg via ORAL

## 2013-01-25 MED ORDER — BORTEZOMIB CHEMO SQ INJECTION 3.5 MG (2.5MG/ML)
1.3000 mg/m2 | Freq: Once | INTRAMUSCULAR | Status: AC
  Administered 2013-01-25: 3 mg via SUBCUTANEOUS
  Filled 2013-01-25: qty 3

## 2013-01-25 MED ORDER — CYCLOPHOSPHAMIDE 50 MG PO TABS: 600.0000 mg | ORAL_TABLET | ORAL | Status: DC

## 2013-01-25 MED ORDER — ONDANSETRON HCL 8 MG PO TABS
8.0000 mg | ORAL_TABLET | Freq: Once | ORAL | Status: AC
  Administered 2013-01-25: 8 mg via ORAL

## 2013-01-25 NOTE — Telephone Encounter (Signed)
Per staff message and POF I have scheduled appts. Advised scheduler that lab appts need to be moved  JMW

## 2013-01-25 NOTE — Patient Instructions (Addendum)
[facility] Discharge Instructions for Patients Receiving Chemotherapy  Today you received the following chemotherapy agents: velcade  To help prevent nausea and vomiting after your treatment, we encourage you to take your nausea medication.  Take it as often as prescribed.     If you develop nausea and vomiting that is not controlled by your nausea medication, call the clinic. If it is after clinic hours your family physician or the after hours number for the clinic or go to the Emergency Department.   BELOW ARE SYMPTOMS THAT SHOULD BE REPORTED IMMEDIATELY:  *FEVER GREATER THAN 100.5 F  *CHILLS WITH OR WITHOUT FEVER  NAUSEA AND VOMITING THAT IS NOT CONTROLLED WITH YOUR NAUSEA MEDICATION  *UNUSUAL SHORTNESS OF BREATH  *UNUSUAL BRUISING OR BLEEDING  TENDERNESS IN MOUTH AND THROAT WITH OR WITHOUT PRESENCE OF ULCERS  *URINARY PROBLEMS  *BOWEL PROBLEMS  UNUSUAL RASH Items with * indicate a potential emergency and should be followed up as soon as possible.  Feel free to call the clinic you have any questions or concerns. The clinic phone number is [phone].   I have been informed and understand all the instructions given to me. I know to contact the clinic, my physician, or go to the Emergency Department if any problems should occur. I do not have any questions at this time, but understand that I may call the clinic during office hours   should I have any questions or need assistance in obtaining follow up care.    __________________________________________  _____________  __________ Signature of Patient or Authorized Representative            Date                   Time    __________________________________________ Nurse's Signature    

## 2013-01-25 NOTE — Telephone Encounter (Signed)
gv and printed appt sched and avs forlpt...emailed MW to add tx....  °

## 2013-01-25 NOTE — Progress Notes (Signed)
OFFICE PROGRESS NOTE  Interval history:  [name] returns as scheduled. He continues Cytoxan/Velcade/dexamethasone therapy. Most recent IgG level was stable at 1180 on 12/28/2012. Most recent serum M spike was stable to slightly improved 0.99 also on 12/28/2012.  He overall feels well. His main complaint is related to "sinus congestion". He denies nausea/vomiting. He has stable numbness in the fingertips. The numbness not interfere with activity. Bowels moving regularly with the aid of a laxative. He continues to have a good appetite.   Objective: Blood pressure 118/77, pulse 74, temperature 97 F (36.1 C), temperature source Oral, resp. rate 20, height 6' (1.829 m), weight 262 lb 14.4 oz (119.251 kg).  Oropharynx is without thrush. Lungs clear. Regular cardiac rhythm. Abdomen soft and nontender. No hepatomegaly. Trace lower leg edema bilaterally. Vibratory sense is moderately decreased over the fingertips per tuning fork exam.  Lab Results: Lab Results  Component Value Date   WBC 6.4 01/25/2013   HGB 13.5 01/25/2013   HCT 38.4 01/25/2013   MCV 86.0 01/25/2013   PLT 111* 01/25/2013    Chemistry:    Chemistry      Component Value Date/Time   NA 138 12/28/2012 1059   NA 134* 03/21/2012 0430   K 4.0 12/28/2012 1059   K 3.6 03/21/2012 0430   CL 106 12/28/2012 1059   CL 102 03/21/2012 0430   CO2 23 12/28/2012 1059   CO2 27 03/21/2012 0430   BUN 15.1 12/28/2012 1059   BUN 5* 03/21/2012 0430   CREATININE 0.9 12/28/2012 1059   CREATININE 0.71 03/21/2012 0430      Component Value Date/Time   CALCIUM 9.1 12/28/2012 1059   CALCIUM 8.3* 03/21/2012 0430   ALKPHOS 56 12/28/2012 1059   ALKPHOS 66 03/20/2012 0135   AST 20 12/28/2012 1059   AST 14 03/20/2012 0135   ALT 10 12/28/2012 1059   ALT 15 03/20/2012 0135   BILITOT 0.56 12/28/2012 1059   BILITOT 0.2* 03/20/2012 0135       Studies/Results: No results found.  Medications: I have reviewed the patient's current  medications.  Assessment/Plan:  1. Multiple myeloma, status post treatment with melphalan/prednisone/thalidomide beginning in November 2009. The thalidomide was increased to 200 mg daily beginning 09/11/2008. The serum M spike was slightly improved on 03/18/2009 and slightly increased on 05/09/2009. He has been maintained off of specific therapy for multiple myeloma since September 2010. The serum M spike was increased on 10/20/2011 at 4.6. He began Revlimid on 11/11/2011. He takes weekly dexamethasone. He began cycle 2 of Revlimid on 12/09/2011. The serum M spike was improved on 12/31/2011. The serum M spike was slightly higher on 01/28/2012. He began cycle 4 on 02/03/2012. He developed recurrent hypercalcemia. Treatment was changed to Cytoxan/Velcade/Decadron beginning 03/02/2012. The serum M spike was lower on 02/25/2012. The serum M spike was stable on 06/09/2012. The serum M spike improved on 08/03/2012 and stable on 08/31/2012. The IgG level was stable on 08/31/2012. The IgG level and serum M spike were improved on 10/05/2012. The IgG level and serum M spike further improved on 11/02/2012 and 11/30/2012. IgG and serum M spike were overall stable on 12/28/2012. 2. Chronic "dizziness." 3. Chest x-ray 11/01/2008 with a patchy opacity at the right midlung suspicious for pneumonia. He was treated with Avelox. 4. Low back pain with a radicular component. An MRI on 10/07/2008 showed no involvement of the lumbosacral spine with myeloma. The pain was felt to be related to degenerative disease involving the lumbar spine and congenital  spinal stenosis. 5. Bilateral neck pain, status post a CT 05/17/2008 with findings of spinal stenosis and osteoarthritis. 6. 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 Red cell microcytosis, likely related to hemoglobin C trait. A ferritin level returned elevated and a stool Hemoccult was  negative 01/23/2010. He underwent a colonoscopy in 2009. 8. Elevated beta-2 microglobulin level. 9. History of hypertension. 10. History of a herniated disk at L4-L5 on MRI an 01/11/2002. 11. Hyperlipidemia. 12. Gastroesophageal reflux disease. 13. History of atypical angina. 14. History of rectal bleeding-large external hemorrhoids noted 02/25/2012. The stool was Hemoccult positive. He has a history of colon polyps. He is followed by [facility]. He was diagnosed with hemorrhoids in September of 2013 while hospitalized. 15. Superficial venous thrombosis of the greater saphenous vein on a Doppler 09/27/2008. Negative for deep vein thrombosis. Symptoms improved with aspirin. 16. Neck pain with numbness/tingling in the arms, hands, and low back with proximal right leg weakness. An MRI of the cervical spine 05/30/2009 showed multilevel spondylosis with mild cord edema at C4-C5 and C5-C6. He was noted to have an enhancing lesion of the cord at T3-T4 of unclear etiology. In the lumbar spine there was no change in the spondylosis at L3-L4 and L4-L5. There was no involvement of the cervical or lumbar spine with myeloma. He is status post C4-C5, C5-C6, and C6-C7 anterior cervical diskectomy with fusion by Dr. Jordan Likes 08/01/2009. 17. History of mild thrombocytopenia. 18. Indeterminate-age deep vein thrombosis of the left lower extremity on a venous Doppler 08/15/2009. There were also findings consistent with superficial thrombosis involving the right lower extremity 08/15/2009. Coumadin was discontinued in October 2011. 19. Type 2 diabetes. 20. History of hematuria, followed at [facility]. Urinalysis was negative for blood on 07/23/2011. 21. Radicular back pain with MRI of the lumbar spine on 10/19/2011 showing nerve root impingement at L2-L3, L3-L4 and L4-L5 with the most severe at the right L3 nerve roots due to a large disc fragment. Associated right leg weakness. He underwent right L2-3 decompressive  laminotomy with right L2 and L3 decompressive foraminotomy; right L2-3 microdiscectomy on 11/01/2011. 22. Constipation He continues a laxative regimen. 23. Hospitalization 11/08/2011 through 11/10/2011 with orthostatic hypotension/dizziness. He improved with intravenous hydration. He had mild hypotension when here on 11/18/2011. We recommended discontinuing Norvasc and Proscar. 24. Hypercalcemia status post pamidronate 11/03/2011. Recurrent hypercalcemia 02/16/2012 status post Zometa. The calcium has remained in normal range. 25. Fractured tooth with associated pain. Status post a tooth extraction by Dr. Kristin Bruins, he is to be scheduled for multiple tooth extractions in the near future. Zometa has been placed on hold. 26. Exertional dyspnea. Stable. He has been evaluated by cardiology. 27. Question Velcade neuropathy with moderate decrease in vibratory sense over the fingertips per tuning fork exam today. 28. Mild erythema medial left abdomen when here on 11/30/2012. Question related to recent Velcade injection.   Disposition-Mr. Wildey appears stable. Plan to continue Cytoxan/Velcade/Decadron on the current schedule. He will return for a followup visit in one month. We will obtain repeat IgG and serum protein electrophoresis on 02/15/2013. He will contact the office in the interim with any problems.  Plan reviewed with Dr. Truett Perna.  Lonna Cobb ANP/GNP-BC

## 2013-01-30 ENCOUNTER — Other Ambulatory Visit: Payer: Self-pay | Admitting: Oncology

## 2013-01-30 ENCOUNTER — Telehealth: Payer: Self-pay | Admitting: *Deleted

## 2013-01-30 NOTE — Telephone Encounter (Signed)
Called [facility] to follow up on status of cyclophosphamide script. Need verbal approval that OK to dispense capsules since the tablets are no longer available. Gave verbal OK.  Made wife aware of change and to call [facility] to arrange delivery.

## 2013-01-30 NOTE — Telephone Encounter (Signed)
Due to change in form from tablet to capsule, his insurance company is requiring a PA. Please fax his med list and last office note to #[phone] and they will do PA. Also waiting to get drug from supplier.

## 2013-01-31 ENCOUNTER — Telehealth: Payer: Self-pay | Admitting: *Deleted

## 2013-01-31 ENCOUNTER — Encounter: Payer: Self-pay | Admitting: *Deleted

## 2013-01-31 ENCOUNTER — Encounter: Payer: Self-pay | Admitting: Family Medicine

## 2013-01-31 ENCOUNTER — Ambulatory Visit (INDEPENDENT_AMBULATORY_CARE_PROVIDER_SITE_OTHER): Payer: Medicare Other | Admitting: Family Medicine

## 2013-01-31 VITALS — BP 124/79 | HR 78 | Temp 97.9°F | Ht 70.5 in | Wt 260.0 lb

## 2013-01-31 DIAGNOSIS — R5383 Other fatigue: Secondary | ICD-10-CM

## 2013-01-31 DIAGNOSIS — R5381 Other malaise: Secondary | ICD-10-CM

## 2013-01-31 DIAGNOSIS — N289 Disorder of kidney and ureter, unspecified: Secondary | ICD-10-CM

## 2013-01-31 DIAGNOSIS — C9000 Multiple myeloma not having achieved remission: Secondary | ICD-10-CM

## 2013-01-31 DIAGNOSIS — G4733 Obstructive sleep apnea (adult) (pediatric): Secondary | ICD-10-CM

## 2013-01-31 DIAGNOSIS — R300 Dysuria: Secondary | ICD-10-CM

## 2013-01-31 DIAGNOSIS — I10 Essential (primary) hypertension: Secondary | ICD-10-CM

## 2013-01-31 DIAGNOSIS — N400 Enlarged prostate without lower urinary tract symptoms: Secondary | ICD-10-CM

## 2013-01-31 DIAGNOSIS — E782 Mixed hyperlipidemia: Secondary | ICD-10-CM

## 2013-01-31 LAB — POCT CBC
Granulocyte percent: 65.6 %G (ref 37–80)
HCT, POC: 40.8 % — AB (ref 43.5–53.7)
Hemoglobin: 13.2 g/dL — AB (ref 14.1–18.1)
Lymph, poc: 1.2 (ref 0.6–3.4)
MCH, POC: 28.9 pg (ref 27–31.2)
MCHC: 32.3 g/dL (ref 31.8–35.4)
MCV: 89.3 fL (ref 80–97)
MPV: 9.6 fL (ref 0–99.8)
POC Granulocyte: 3.4 (ref 2–6.9)
POC LYMPH PERCENT: 23 %L (ref 10–50)
Platelet Count, POC: 111 10*3/uL — AB (ref 142–424)
RBC: 4.6 M/uL — AB (ref 4.69–6.13)
RDW, POC: 16.9 %
WBC: 5.2 10*3/uL (ref 4.6–10.2)

## 2013-01-31 LAB — POCT URINALYSIS DIPSTICK
Bilirubin, UA: NEGATIVE
Blood, UA: NEGATIVE
Glucose, UA: NEGATIVE
Ketones, UA: NEGATIVE
Leukocytes, UA: NEGATIVE
Nitrite, UA: NEGATIVE
Protein, UA: NEGATIVE
Spec Grav, UA: 1.02
Urobilinogen, UA: NEGATIVE
pH, UA: 5

## 2013-01-31 LAB — POCT UA - MICROSCOPIC ONLY
Casts, Ur, LPF, POC: NEGATIVE
Crystals, Ur, HPF, POC: NEGATIVE
Epithelial cells, urine per micros: NEGATIVE
RBC, urine, microscopic: NEGATIVE
Yeast, UA: NEGATIVE

## 2013-01-31 NOTE — Progress Notes (Signed)
Notified by Judeth Cornfield at [facility] that co pay for 1 month supply of cytoxan after his insurance pays is $114 and they can't afford this. Forwarded this to managed care to determine if there was any co pay assistance available.

## 2013-01-31 NOTE — Telephone Encounter (Signed)
Made wife aware that Dr. Truett Perna wants him to continue his weekly dexamethasone, even if he does not have the cyclophosphamide. She requested nurse call [facility] and make them send his medicine to him even though he can't afford the co pay. Informed her they can't ship meds without a guarantee of payment. Will see if managed care can find any options to help. Made her aware she will more than likely need to get paperwork together for this process.

## 2013-01-31 NOTE — Progress Notes (Signed)
Patient ID: [name], male   DOB: [birth year], 77 y.o.   MRN: [id number] SUBJECTIVE: CC: Chief Complaint  Patient presents with  . Hyperlipidemia    3-4 mo  . Hypertension    wants labs , non-fasting  . Benign Prostatic Hypertrophy    HPI:  Has nasal stuffiness all the time and nasal congestion Also fatigue all the time And his urine burns to urinate once in a while. The Cancer center checked his urine and told it was fine. Feels tired , no chest pain, no SOB. Doing well with his Multiple myeloma.  Patient is here for follow up of hyperlipidemia/Hypertension/BPH: denies Headache;denies Chest Pain;denies weakness;denies Shortness of Breath and orthopnea;denies Visual changes;denies palpitations;denies cough;denies pedal edema;denies symptoms of TIA or stroke;deniesClaudication symptoms. admits to Compliance with medications; denies Problems with medications.  Wants labs checked  Doing well with the Multiple myeloma treatments.  Past Medical History  Diagnosis Date  . GERD (gastroesophageal reflux disease)   . Essential hypertension, benign   . BPH (benign prostatic hyperplasia)   . Lumbar herniated disc     L4-5  . Hyperlipidemia   . Sleep apnea   . Multiple myeloma 02/19/2008  . Arthritis   . Diverticulosis   . Peptic ulcer disease   . Tubular adenoma 02/16/2008    Dr. Stan Head   Past Surgical History  Procedure Laterality Date  . Neck surgery    . Lumbar laminectomy/decompression microdiscectomy  11/01/2011    Procedure: LUMBAR LAMINECTOMY/DECOMPRESSION MICRODISCECTOMY 2 LEVELS;  Surgeon: Temple Pacini, MD;  Location: [facility];  Service: Neurosurgery;  Laterality: Right;  Lumbar Laminectomy/Microdiscectomy Decompression Lumbar Three-Four, Lumbar Four-Five   . Colonoscopy     History   Social History  . Marital Status: Married    Spouse Name: N/A    Number of Children: 4  . Years of Education: N/A   Occupational History  . retired-disabled-Construction     Social History Main Topics  . Smoking status: Former Smoker -- 2.00 packs/day for 20 years    Types: Cigarettes    Quit date: 07/12/1990  . Smokeless tobacco: Never Used  . Alcohol Use: No  . Drug Use: No  . Sexually Active: Not on file   Other Topics Concern  . Not on file   Social History Narrative   Pt was adopted.   Family History  Problem Relation Age of Onset  . Adopted: Yes   Current Outpatient Prescriptions on File Prior to Visit  Medication Sig Dispense Refill  . acyclovir (ZOVIRAX) 400 MG tablet TAKE 1 TABLET (400 MG TOTAL) BY MOUTH 2 (TWO) TIMES DAILY.  60 tablet  2  . amLODipine (NORVASC) 5 MG tablet Take 1 tablet (5 mg total) by mouth daily.  30 tablet  4  . aspirin 325 MG EC tablet Take 1 tablet (325 mg total) by mouth 2 (two) times daily.      Marland Kitchen atorvastatin (LIPITOR) 10 MG tablet Take 1 tablet (10 mg total) by mouth daily.  30 tablet  3  . cyclophosphamide (CYTOXAN) 50 MG tablet Take 12 tablets (600 mg total) by mouth once a week. Give on an empty stomach 1 hour before or 2 hours after meals.  48 tablet  1  . dexamethasone (DECADRON) 4 MG tablet Take 5 tablets (20 mg total) by mouth as directed. Take 20 mg by mouth on Thursday not getting dose at [facility]  15 tablet  2  . fluticasone (FLONASE) 50 MCG/ACT nasal spray Place  2 sprays into the nose daily.  16 g  6  . nitroGLYCERIN (NITROSTAT) 0.4 MG SL tablet Place 1 tablet (0.4 mg total) under the tongue every 5 (five) minutes as needed for chest pain.  25 tablet  3  . omeprazole (PRILOSEC) 20 MG capsule Take 1 capsule (20 mg total) by mouth daily.  30 capsule  4  . Polyethylene Glycol 3350 (MIRALAX PO) Take 17 g by mouth every morning.      . prochlorperazine (COMPAZINE) 10 MG tablet TAKE 1 TABLET BY MOUTH EVERY SIX HOURS AS NEEDED  30 tablet  2  . tamsulosin (FLOMAX) 0.4 MG CAPS Take 1 capsule (0.4 mg total) by mouth 2 (two) times daily with a meal.  60 capsule  5  . zolpidem (AMBIEN) 10 MG tablet Take 0.5  tablets (5 mg total) by mouth at bedtime as needed.  30 tablet  3   No current facility-administered medications on file prior to visit.   Allergies  Allergen Reactions  . Penicillins Hives and Rash   Immunization History  Administered Date(s) Administered  . Influenza Split 04/23/2011, 04/11/2012   Prior to Admission medications   Medication Sig Start Date End Date Taking? Authorizing Provider  acyclovir (ZOVIRAX) 400 MG tablet TAKE 1 TABLET (400 MG TOTAL) BY MOUTH 2 (TWO) TIMES DAILY. 01/15/13  Yes Ladene Artist, MD  amLODipine (NORVASC) 5 MG tablet Take 1 tablet (5 mg total) by mouth daily. 10/12/12  Yes Ileana Ladd, MD  aspirin 325 MG EC tablet Take 1 tablet (325 mg total) by mouth 2 (two) times daily. 12/07/12  Yes Prescott Parma, PA-C  atorvastatin (LIPITOR) 10 MG tablet Take 1 tablet (10 mg total) by mouth daily. 10/13/12  Yes Ileana Ladd, MD  cyclophosphamide (CYTOXAN) 50 MG tablet Take 12 tablets (600 mg total) by mouth once a week. Give on an empty stomach 1 hour before or 2 hours after meals. 01/25/13  Yes Ladene Artist, MD  dexamethasone (DECADRON) 4 MG tablet Take 5 tablets (20 mg total) by mouth as directed. Take 20 mg by mouth on Thursday not getting dose at [facility] 01/25/13  Yes Rana Snare, NP  fluticasone [facility]) 50 MCG/ACT nasal spray Place 2 sprays into the nose daily. 11/23/12  Yes Ileana Ladd, MD  nitroGLYCERIN (NITROSTAT) 0.4 MG SL tablet Place 1 tablet (0.4 mg total) under the tongue every 5 (five) minutes as needed for chest pain. 12/07/12  Yes Prescott Parma, PA-C  omeprazole (PRILOSEC) 20 MG capsule Take 1 capsule (20 mg total) by mouth daily. 10/06/12  Yes Ileana Ladd, MD  Polyethylene Glycol 3350 (MIRALAX PO) Take 17 g by mouth every morning.   Yes Historical Provider, MD  prochlorperazine (COMPAZINE) 10 MG tablet TAKE 1 TABLET BY MOUTH EVERY SIX HOURS AS NEEDED 09/12/12  Yes Ladene Artist, MD  tamsulosin (FLOMAX) 0.4 MG CAPS Take 1 capsule (0.4 mg  total) by mouth 2 (two) times daily with a meal. 12/18/12  Yes Ileana Ladd, MD  zolpidem (AMBIEN) 10 MG tablet Take 0.5 tablets (5 mg total) by mouth at bedtime as needed. 06/19/12  Yes Waymon Budge, MD     ROS: As above in the HPI. All other systems are stable or negative.  OBJECTIVE: APPEARANCE:  Patient in no acute distress.The patient appeared well nourished and normally developed. Acyanotic. Waist: VITAL SIGNS:BP 124/79  Pulse 78  Temp(Src) 97.9 F (36.6 C) (Oral)  Ht 5' 10.5" (1.791 m)  Wt 260 lb (117.935 kg)  BMI 36.77 kg/m2 OBESE AAM  SKIN: warm and  Dry without overt rashes, tattoos and scars  HEAD and Neck: without JVD, Head and scalp: normal Eyes:No scleral icterus. Fundi normal, eye movements normal. Ears: Auricle normal, canal normal, Tympanic membranes normal, insufflation normal. Nose: normal Throat: normal Neck & thyroid: normal  CHEST & LUNGS: Chest wall: normal Lungs: Clear  CVS: Reveals the PMI to be normally located. Regular rhythm, First and Second Heart sounds are normal,  absence of murmurs, rubs or gallops. Peripheral vasculature: Radial pulses: normal Dorsal pedis pulses: normal Posterior pulses: normal  ABDOMEN:  Appearance: Obese Benign, no organomegaly, no masses, no Abdominal Aortic enlargement. No Guarding , no rebound. No Bruits. Bowel sounds: normal  RECTAL: brown stools moderate enlarged. No nodules. GU: No penile discharge.  EXTREMETIES: nonedematous.   NEUROLOGIC: oriented to time,place and person; ambulates with a cane well.  Results for orders placed in visit on 01/25/13  CBC WITH DIFFERENTIAL      Result Value Range   WBC 6.4  4.0 - 10.3 10e3/uL   NEUT# 4.0  1.5 - 6.5 10e3/uL   HGB 13.5  13.0 - 17.1 g/dL   HCT 09.8  11.9 - 14.7 %   Platelets 111 (*) 140 - 400 10e3/uL   MCV 86.0  79.3 - 98.0 fL   MCH 30.1  27.2 - 33.4 pg   MCHC 35.0  32.0 - 36.0 g/dL   RBC 8.29  5.62 - 1.30 10e6/uL   RDW 17.0 (*) 11.0 -  14.6 %   lymph# 1.2  0.9 - 3.3 10e3/uL   MONO# 1.1 (*) 0.1 - 0.9 10e3/uL   Eosinophils Absolute 0.1  0.0 - 0.5 10e3/uL   Basophils Absolute 0.0  0.0 - 0.1 10e3/uL   NEUT% 61.3  39.0 - 75.0 %   LYMPH% 18.8  14.0 - 49.0 %   MONO% 17.8 (*) 0.0 - 14.0 %   EOS% 1.8  0.0 - 7.0 %   BASO% 0.3  0.0 - 2.0 %    ASSESSMENT: Multiple myeloma - Plan: POCT CBC, COMPLETE METABOLIC PANEL WITH GFR  Renal insufficiency - Plan: COMPLETE METABOLIC PANEL WITH GFR  Obstructive sleep apnea  Mixed hyperlipidemia - Plan: COMPLETE METABOLIC PANEL WITH GFR, NMR Lipoprofile with Lipids  Essential hypertension, benign - Plan: COMPLETE METABOLIC PANEL WITH GFR  Other malaise and fatigue - Plan: POCT CBC, TSH, Vitamin D 25 hydroxy, Vitamin B12, Folate, co-enzyme Q-10 50 MG capsule  Dysuria - Plan: POCT urinalysis dipstick, POCT UA - Microscopic Only, Urine culture  BPH (benign prostatic hyperplasia)  PLAN: Orders Placed This Encounter  Procedures  . Urine culture  . COMPLETE METABOLIC PANEL WITH GFR  . NMR Lipoprofile with Lipids  . TSH  . Vitamin D 25 hydroxy  . Vitamin B12  . Folate  . POCT CBC  . POCT urinalysis dipstick  . POCT UA - Microscopic Only   Meds ordered this encounter  Medications  . co-enzyme Q-10 50 MG capsule    Sig: Take 4 capsules (200 mg total) by mouth daily.    Dispense:  120 capsule    Refill:  11   Patient declined an antibiotic in case this was a low grade prostatitis. Await labs.  Keep active. Suspect his side effects is from his medications.  Return in about 3 months (around 05/03/2013) for Recheck medical problems.   Ibrahem Volkman P. Modesto Charon, M.D.

## 2013-01-31 NOTE — Progress Notes (Signed)
Received fax from [facility] stating patient was approved under Part B benefit for Cyclophosphamide 50mg  .

## 2013-02-01 ENCOUNTER — Ambulatory Visit: Payer: Self-pay

## 2013-02-01 ENCOUNTER — Other Ambulatory Visit: Payer: Self-pay | Admitting: Lab

## 2013-02-01 ENCOUNTER — Encounter: Payer: Self-pay | Admitting: Oncology

## 2013-02-01 LAB — COMPLETE METABOLIC PANEL WITH GFR
ALT: 10 U/L (ref 0–53)
AST: 20 U/L (ref 0–37)
Albumin: 3.7 g/dL (ref 3.5–5.2)
Alkaline Phosphatase: 48 U/L (ref 39–117)
BUN: 10 mg/dL (ref 6–23)
CO2: 28 mEq/L (ref 19–32)
Calcium: 9.3 mg/dL (ref 8.4–10.5)
Chloride: 105 mEq/L (ref 96–112)
Creat: 0.89 mg/dL (ref 0.50–1.35)
GFR, Est African American: >89 mL/min
GFR, Est Non African American: 84 mL/min
Glucose, Bld: 94 mg/dL (ref 70–99)
Potassium: 4.2 mEq/L (ref 3.5–5.3)
Sodium: 140 mEq/L (ref 135–145)
Total Bilirubin: 0.6 mg/dL (ref 0.3–1.2)
Total Protein: 6.3 g/dL (ref 6.0–8.3)

## 2013-02-01 LAB — NMR LIPOPROFILE WITH LIPIDS
Cholesterol, Total: 180 mg/dL (ref <200)
HDL Particle Number: 32.3 umol/L (ref >=30.5)
HDL Size: 9.1 nm — ABNORMAL LOW (ref >=9.2)
HDL-C: 47 mg/dL (ref >=40)
LDL (calc): 104 mg/dL — ABNORMAL HIGH (ref <100)
LDL Particle Number: 1479 nmol/L — ABNORMAL HIGH (ref <1000)
LDL Size: 21.1 nm (ref >20.5)
LP-IR Score: 32 (ref <=45)
Large HDL-P: 6.2 umol/L (ref >=4.8)
Large VLDL-P: 1.2 nmol/L (ref <=2.7)
Small LDL Particle Number: 732 nmol/L — ABNORMAL HIGH (ref <=527)
Triglycerides: 143 mg/dL (ref <150)
VLDL Size: 43.5 nm (ref <=46.6)

## 2013-02-01 LAB — URINE CULTURE
Colony Count: NO GROWTH
Organism ID, Bacteria: NO GROWTH

## 2013-02-01 LAB — TSH: TSH: 1.727 u[IU]/mL (ref 0.350–4.500)

## 2013-02-01 LAB — VITAMIN D 25 HYDROXY (VIT D DEFICIENCY, FRACTURES): Vit D, 25-Hydroxy: 15 ng/mL — ABNORMAL LOW (ref 30–89)

## 2013-02-01 LAB — VITAMIN B12: Vitamin B-12: 531 pg/mL (ref 211–911)

## 2013-02-01 LAB — FOLATE: Folate: >20 ng/mL

## 2013-02-01 NOTE — Progress Notes (Signed)
[facility], [id number], approved cyclophosphamide 50mg  under Medicare Part B from 01/30/13-07/11/13.

## 2013-02-02 ENCOUNTER — Other Ambulatory Visit: Payer: Self-pay | Admitting: Family Medicine

## 2013-02-02 DIAGNOSIS — E785 Hyperlipidemia, unspecified: Secondary | ICD-10-CM

## 2013-02-02 MED ORDER — ATORVASTATIN CALCIUM 20 MG PO TABS: 20.0000 mg | ORAL_TABLET | Freq: Every day | ORAL | Status: DC

## 2013-02-05 ENCOUNTER — Telehealth: Payer: Self-pay | Admitting: *Deleted

## 2013-02-05 NOTE — Telephone Encounter (Signed)
Wife called to report shipment of cytoxan received today. Should he begin today or wait until Thursday (usual day he takes it)? Spoke with Lonna Cobb, NP and was decided to wait and resume on Thursday, his normal day of taking-change in schedule could lead to confusion for him. Wife notified.

## 2013-02-05 NOTE — Progress Notes (Signed)
RECEIVED A FAX FROM [facility] CONCERNING A CONFIRMATION OF PRESCRIPTION SHIPMENT FOR CYCLOPHOSPHAMIDE ON 02/02/13.

## 2013-02-06 ENCOUNTER — Encounter: Payer: Self-pay | Admitting: Oncology

## 2013-02-06 NOTE — Progress Notes (Signed)
Received letter from Patient [employer].  Pt is approved for Cytoxan from 02/02/13 to 02/01/14 or when the benefit cap has been met.  The amount of grant is $10,000.  I will give letter to Medical records to be scanned in pt's chart.

## 2013-02-08 ENCOUNTER — Other Ambulatory Visit (HOSPITAL_BASED_OUTPATIENT_CLINIC_OR_DEPARTMENT_OTHER): Payer: Medicare Other | Admitting: Lab

## 2013-02-08 ENCOUNTER — Ambulatory Visit (HOSPITAL_BASED_OUTPATIENT_CLINIC_OR_DEPARTMENT_OTHER): Payer: Medicare Other

## 2013-02-08 VITALS — BP 113/68 | HR 85 | Temp 97.4°F | Resp 20

## 2013-02-08 DIAGNOSIS — Z5112 Encounter for antineoplastic immunotherapy: Secondary | ICD-10-CM

## 2013-02-08 DIAGNOSIS — C9000 Multiple myeloma not having achieved remission: Secondary | ICD-10-CM

## 2013-02-08 LAB — CBC WITH DIFFERENTIAL/PLATELET
BASO%: 0.7 % (ref 0.0–2.0)
EOS%: 1.7 % (ref 0.0–7.0)
MCH: 30.1 pg (ref 27.2–33.4)
MCHC: 35.1 g/dL (ref 32.0–36.0)
MCV: 85.9 fL (ref 79.3–98.0)
MONO%: 16.7 % — ABNORMAL HIGH (ref 0.0–14.0)
NEUT#: 3.8 10*3/uL (ref 1.5–6.5)
RBC: 4.41 10*6/uL (ref 4.20–5.82)
RDW: 17.3 % — ABNORMAL HIGH (ref 11.0–14.6)

## 2013-02-08 MED ORDER — DEXAMETHASONE 4 MG PO TABS
40.0000 mg | ORAL_TABLET | Freq: Once | ORAL | Status: AC
  Administered 2013-02-08: 40 mg via ORAL

## 2013-02-08 MED ORDER — BORTEZOMIB CHEMO SQ INJECTION 3.5 MG (2.5MG/ML)
1.3000 mg/m2 | Freq: Once | INTRAMUSCULAR | Status: AC
  Administered 2013-02-08: 3 mg via SUBCUTANEOUS
  Filled 2013-02-08: qty 3

## 2013-02-08 MED ORDER — ONDANSETRON HCL 8 MG PO TABS
8.0000 mg | ORAL_TABLET | Freq: Once | ORAL | Status: AC
  Administered 2013-02-08: 8 mg via ORAL

## 2013-02-10 ENCOUNTER — Other Ambulatory Visit: Payer: Self-pay

## 2013-02-10 ENCOUNTER — Encounter (HOSPITAL_COMMUNITY): Payer: Self-pay | Admitting: Nurse Practitioner

## 2013-02-10 ENCOUNTER — Emergency Department (HOSPITAL_COMMUNITY): Payer: Medicare Other

## 2013-02-10 ENCOUNTER — Emergency Department (HOSPITAL_COMMUNITY)
Admission: EM | Admit: 2013-02-10 | Discharge: 2013-02-10 | Disposition: A | Discharge status: Home / Self Care | Payer: Medicare Other | Attending: Emergency Medicine | Admitting: Emergency Medicine

## 2013-02-10 DIAGNOSIS — Z79899 Other long term (current) drug therapy: Secondary | ICD-10-CM | POA: Insufficient documentation

## 2013-02-10 DIAGNOSIS — K921 Melena: Secondary | ICD-10-CM | POA: Insufficient documentation

## 2013-02-10 DIAGNOSIS — Z7982 Long term (current) use of aspirin: Secondary | ICD-10-CM | POA: Insufficient documentation

## 2013-02-10 DIAGNOSIS — I10 Essential (primary) hypertension: Secondary | ICD-10-CM | POA: Insufficient documentation

## 2013-02-10 DIAGNOSIS — K649 Unspecified hemorrhoids: Secondary | ICD-10-CM | POA: Insufficient documentation

## 2013-02-10 DIAGNOSIS — M129 Arthropathy, unspecified: Secondary | ICD-10-CM | POA: Insufficient documentation

## 2013-02-10 DIAGNOSIS — Z87448 Personal history of other diseases of urinary system: Secondary | ICD-10-CM | POA: Insufficient documentation

## 2013-02-10 DIAGNOSIS — Z8739 Personal history of other diseases of the musculoskeletal system and connective tissue: Secondary | ICD-10-CM | POA: Insufficient documentation

## 2013-02-10 DIAGNOSIS — Z87891 Personal history of nicotine dependence: Secondary | ICD-10-CM | POA: Insufficient documentation

## 2013-02-10 DIAGNOSIS — E785 Hyperlipidemia, unspecified: Secondary | ICD-10-CM | POA: Insufficient documentation

## 2013-02-10 DIAGNOSIS — K219 Gastro-esophageal reflux disease without esophagitis: Secondary | ICD-10-CM | POA: Insufficient documentation

## 2013-02-10 DIAGNOSIS — Z8719 Personal history of other diseases of the digestive system: Secondary | ICD-10-CM | POA: Insufficient documentation

## 2013-02-10 DIAGNOSIS — K5900 Constipation, unspecified: Secondary | ICD-10-CM | POA: Insufficient documentation

## 2013-02-10 DIAGNOSIS — Z860100 Personal history of colon polyps, unspecified: Secondary | ICD-10-CM | POA: Insufficient documentation

## 2013-02-10 DIAGNOSIS — K625 Hemorrhage of anus and rectum: Secondary | ICD-10-CM | POA: Insufficient documentation

## 2013-02-10 DIAGNOSIS — Z880 Allergy status to penicillin: Secondary | ICD-10-CM | POA: Insufficient documentation

## 2013-02-10 DIAGNOSIS — Z87898 Personal history of other specified conditions: Secondary | ICD-10-CM | POA: Insufficient documentation

## 2013-02-10 LAB — CBC WITH DIFFERENTIAL/PLATELET
HCT: 32.6 % — ABNORMAL LOW (ref 39.0–52.0)
Hemoglobin: 11.9 g/dL — ABNORMAL LOW (ref 13.0–17.0)
Lymphocytes Relative: 25 % (ref 12–46)
MCV: 81.3 fL (ref 78.0–100.0)
Monocytes Absolute: 0.7 10*3/uL (ref 0.1–1.0)
Monocytes Relative: 14 % — ABNORMAL HIGH (ref 3–12)
Neutro Abs: 3.1 10*3/uL (ref 1.7–7.7)
WBC: 5.2 10*3/uL (ref 4.0–10.5)

## 2013-02-10 LAB — COMPREHENSIVE METABOLIC PANEL
BUN: 18 mg/dL (ref 6–23)
CO2: 26 mEq/L (ref 19–32)
Chloride: 100 mEq/L (ref 96–112)
Creatinine, Ser: 0.82 mg/dL (ref 0.50–1.35)
GFR calc Af Amer: >90 mL/min (ref >90)
GFR calc non Af Amer: 84 mL/min — ABNORMAL LOW (ref >90)
Glucose, Bld: 213 mg/dL — ABNORMAL HIGH (ref 70–99)
Total Bilirubin: 0.2 mg/dL — ABNORMAL LOW (ref 0.3–1.2)

## 2013-02-10 LAB — OCCULT BLOOD, POC DEVICE: Fecal Occult Bld: POSITIVE — AB

## 2013-02-10 LAB — TYPE AND SCREEN
ABO/RH(D): B POS
Antibody Screen: NEGATIVE

## 2013-02-10 MED ORDER — HYDROCORTISONE ACETATE 25 MG RE SUPP: 25.0000 mg | Freq: Two times a day (BID) | RECTAL | Status: DC

## 2013-02-10 MED ORDER — MAGNESIUM CITRATE PO SOLN: 1.0000 | Freq: Once | ORAL | Status: DC

## 2013-02-10 MED ORDER — LACTULOSE 10 GM/15ML PO SOLN: 10.0000 g | Freq: Two times a day (BID) | ORAL | Status: DC | PRN

## 2013-02-10 NOTE — ED Notes (Signed)
Pt states he noticed dark red blood in stool on Wednesday, thought it went away, had BM today and noticed more dark blood in stool. Cancer pt- multiple myeloma

## 2013-02-10 NOTE — ED Notes (Signed)
Per pt: pt came in with rectal bleeding that began on Wednesday after having hard stools.  Pt has hx of hemorrhoids.

## 2013-02-10 NOTE — ED Notes (Signed)
MD at bedside. 

## 2013-02-10 NOTE — ED Provider Notes (Signed)
CSN: [id number]     Arrival date & time 02/10/13  1812 History     First MD Initiated Contact with Patient 02/10/13 1819     Chief Complaint  Patient presents with  . Rectal Bleeding   (Consider location/radiation/quality/duration/timing/severity/associated sxs/prior Treatment) HPI Comments: Patient presents to the ER for evaluation of rectal bleeding. Patient reports that he has been having intermittent episodes of red blood mixed with his stools for 3 days. Patient reports that he has been constipated, has a history of chronic constipation. He says that he sees the bleeding when he strains hard to have a bowel movement. Has not been any vomiting or hematemesis. Patient denies melena.  Patient is a 77 y.o. male presenting with hematochezia.  Rectal Bleeding   Past Medical History  Diagnosis Date  . GERD (gastroesophageal reflux disease)   . Essential hypertension, benign   . BPH (benign prostatic hyperplasia)   . Lumbar herniated disc     L4-5  . Hyperlipidemia   . Sleep apnea   . Multiple myeloma 02/19/2008  . Arthritis   . Diverticulosis   . Peptic ulcer disease   . Tubular adenoma 02/16/2008    Dr. Stan Head   Past Surgical History  Procedure Laterality Date  . Neck surgery    . Lumbar laminectomy/decompression microdiscectomy  11/01/2011    Procedure: LUMBAR LAMINECTOMY/DECOMPRESSION MICRODISCECTOMY 2 LEVELS;  Surgeon: Temple Pacini, MD;  Location: [facility];  Service: Neurosurgery;  Laterality: Right;  Lumbar Laminectomy/Microdiscectomy Decompression Lumbar Three-Four, Lumbar Four-Five   . Colonoscopy     Family History  Problem Relation Age of Onset  . Adopted: Yes   History  Substance Use Topics  . Smoking status: Former Smoker -- 2.00 packs/day for 20 years    Types: Cigarettes    Quit date: 07/12/1990  . Smokeless tobacco: Never Used  . Alcohol Use: No    Review of Systems  Gastrointestinal: Positive for constipation, blood in stool and hematochezia.   All other systems reviewed and are negative.    Allergies  Penicillins  Home Medications   Current Outpatient Rx  Name  Route  Sig  Dispense  Refill  . acyclovir (ZOVIRAX) 400 MG tablet   Oral   Take 400 mg by mouth 2 (two) times daily.         Marland Kitchen amLODipine (NORVASC) 5 MG tablet   Oral   Take 1 tablet (5 mg total) by mouth daily.   30 tablet   4   . aspirin 325 MG EC tablet   Oral   Take 1 tablet (325 mg total) by mouth 2 (two) times daily.         Marland Kitchen atorvastatin (LIPITOR) 20 MG tablet   Oral   Take 1 tablet (20 mg total) by mouth daily.   30 tablet   3   . co-enzyme Q-10 50 MG capsule   Oral   Take 4 capsules (200 mg total) by mouth daily.   120 capsule   11   . cyclophosphamide (CYTOXAN) 50 MG tablet   Oral   Take 12 tablets (600 mg total) by mouth once a week. Give on an empty stomach 1 hour before or 2 hours after meals.   48 tablet   1     Faxed to [facility] [phone]   . dexamethasone (DECADRON) 4 MG tablet   Oral   Take 20-40 mg by mouth once a week. Taken on Thursdays; 5 tabs on the weeks he does  not get treatment at cancer center, otherwise 10.         . nitroGLYCERIN (NITROSTAT) 0.4 MG SL tablet   Sublingual   Place 1 tablet (0.4 mg total) under the tongue every 5 (five) minutes as needed for chest pain.   25 tablet   3   . omeprazole (PRILOSEC) 20 MG capsule   Oral   Take 1 capsule (20 mg total) by mouth daily.   30 capsule   4   . polyethylene glycol (MIRALAX / GLYCOLAX) packet   Oral   Take 17 g by mouth every evening.         Marland Kitchen PRESCRIPTION MEDICATION   Injection   Inject as directed every 7 (seven) days. Velcade 3mg  injection q7d on Thursdays, skips every 4th week.         . prochlorperazine (COMPAZINE) 10 MG tablet   Oral   Take 10 mg by mouth every 6 (six) hours as needed (for nausea).         . tamsulosin (FLOMAX) 0.4 MG CAPS   Oral   Take 1 capsule (0.4 mg total) by mouth 2 (two) times daily with a  meal.   60 capsule   5   . zolpidem (AMBIEN) 10 MG tablet   Oral   Take 5 mg by mouth at bedtime as needed for sleep.          BP 126/72  Pulse 73  Temp(Src) 98 F (36.7 C) (Oral)  Resp 19  Ht 6\' 6"  (1.981 m)  Wt 260 lb (117.935 kg)  BMI 30.05 kg/m2  SpO2 94% Physical Exam  Constitutional: He is oriented to person, place, and time. He appears well-developed and well-nourished. No distress.  HENT:  Head: Normocephalic and atraumatic.  Right Ear: Hearing normal.  Left Ear: Hearing normal.  Nose: Nose normal.  Mouth/Throat: Oropharynx is clear and moist and mucous membranes are normal.  Eyes: Conjunctivae and EOM are normal. Pupils are equal, round, and reactive to light.  Neck: Normal range of motion. Neck supple.  Cardiovascular: Regular rhythm, S1 normal and S2 normal.  Exam reveals no gallop and no friction rub.   No murmur heard. Pulmonary/Chest: Effort normal and breath sounds normal. No respiratory distress. He exhibits no tenderness.  Abdominal: Soft. Normal appearance and bowel sounds are normal. There is no hepatosplenomegaly. There is no tenderness. There is no rebound, no guarding, no tenderness at McBurney's point and negative Murphy's sign. No hernia.  Musculoskeletal: Normal range of motion.  Neurological: He is alert and oriented to person, place, and time. He has normal strength. No cranial nerve deficit or sensory deficit. Coordination normal. GCS eye subscore is 4. GCS verbal subscore is 5. GCS motor subscore is 6.  Skin: Skin is warm, dry and intact. No rash noted. No cyanosis.  Psychiatric: He has a normal mood and affect. His speech is normal and behavior is normal. Thought content normal.    ED Course   Procedures (including critical care time)  Labs Reviewed  CBC WITH DIFFERENTIAL - Abnormal; Notable for the following:    RBC 4.01 (*)    Hemoglobin 11.9 (*)    HCT 32.6 (*)    MCHC 36.5 (*)    RDW 16.1 (*)    Platelets 134 (*)    Monocytes  Relative 14 (*)    All other components within normal limits  COMPREHENSIVE METABOLIC PANEL - Abnormal; Notable for the following:    Glucose, Bld 213 (*)  Total Protein 5.9 (*)    Albumin 3.0 (*)    Total Bilirubin 0.2 (*)    GFR calc non Af Amer 84 (*)    All other components within normal limits  OCCULT BLOOD, POC DEVICE - Abnormal; Notable for the following:    Fecal Occult Bld POSITIVE (*)    All other components within normal limits  PROTIME-INR  CG4 I-STAT (LACTIC ACID)  TYPE AND SCREEN   Dg Abd Acute W/chest  02/10/2013   *RADIOLOGY REPORT*  Clinical Data: Rectal bleeding, shortness of breath  ACUTE ABDOMEN SERIES (ABDOMEN 2 VIEW & CHEST 1 VIEW)  Comparison: 08/31/2012  Findings: The cardiac shadow is within normal limits.  The lungs are clear bilaterally.  Scattered large and small bowel gas is seen.  Fecal material is noted throughout the colon.  No free air is noted.  No abnormal mass or abnormal calcifications are seen.  IMPRESSION: No acute abnormality noted.   Original Report Authenticated By: Alcide Clever, M.D.   Diagnosis: 1. Constipation 2. Rectal bleeding  MDM  Patient presents to the ER for evaluation of rectal bleeding. Patient reports that he has been constipated and has been using MiraLax and straining to have bowel movements. Has noticed blood mixed with hard stools for the last few days. Patient reports a history of bleeding secondary to internal hemorrhoids.  2 was unremarkable. Hemoglobin is slightly below his baseline, but about 10, does not require transfusion or immediate intervention. Bleeding is likely from the hemorrhoids. His exam reveals multiple external hemorrhoids that are oozing blood. He also reports history of internal hemorrhoids.  Patient does appear constipated. X-ray does not show any obstruction but does show stool throughout the colon consistent with constipation. Patient is currently taking MiraLax. It has not been helpful. Patient will be  given magnesium citrate and lactulose, follow up with primary doctor and GI.  Gilda Crease, MD 02/10/13 [phone]

## 2013-02-10 NOTE — ED Notes (Signed)
Blood Consent taken to patient bedside. Patient refusing to sign at this time. Patient request to wait until after labs have resulted and to speak with physician at that time.

## 2013-02-12 ENCOUNTER — Encounter: Payer: Self-pay | Admitting: *Deleted

## 2013-02-12 ENCOUNTER — Ambulatory Visit (INDEPENDENT_AMBULATORY_CARE_PROVIDER_SITE_OTHER): Payer: Medicare Other | Admitting: Family Medicine

## 2013-02-12 VITALS — BP 120/74 | HR 76 | Temp 97.4°F | Ht 72.0 in | Wt 260.0 lb

## 2013-02-12 DIAGNOSIS — K644 Residual hemorrhoidal skin tags: Secondary | ICD-10-CM

## 2013-02-12 DIAGNOSIS — K625 Hemorrhage of anus and rectum: Secondary | ICD-10-CM | POA: Insufficient documentation

## 2013-02-12 DIAGNOSIS — C9000 Multiple myeloma not having achieved remission: Secondary | ICD-10-CM

## 2013-02-12 DIAGNOSIS — K5900 Constipation, unspecified: Secondary | ICD-10-CM

## 2013-02-12 DIAGNOSIS — K648 Other hemorrhoids: Secondary | ICD-10-CM

## 2013-02-12 DIAGNOSIS — I10 Essential (primary) hypertension: Secondary | ICD-10-CM

## 2013-02-12 LAB — POCT CBC
Granulocyte percent: 69.6 %G (ref 37–80)
HCT, POC: 38.9 % — AB (ref 43.5–53.7)
Hemoglobin: 13 g/dL — AB (ref 14.1–18.1)
Lymph, poc: 1.2 (ref 0.6–3.4)
MCH, POC: 29.4 pg (ref 27–31.2)
MCHC: 33.4 g/dL (ref 31.8–35.4)
MCV: 88.1 fL (ref 80–97)
MPV: 9.3 fL (ref 0–99.8)
POC Granulocyte: 3.5 (ref 2–6.9)
POC LYMPH PERCENT: 24.3 %L (ref 10–50)
Platelet Count, POC: 121 10*3/uL — AB (ref 142–424)
RBC: 4.4 M/uL — AB (ref 4.69–6.13)
RDW, POC: 16.5 %
WBC: 5 10*3/uL (ref 4.6–10.2)

## 2013-02-12 NOTE — Progress Notes (Signed)
Patient ID: [name], male   DOB: 1936/04/10, 77 y.o.   MRN: [id number] SUBJECTIVE: CC: Chief Complaint  Patient presents with  . Follow-up    [place] - er only on 02/10/13- rectal bleeding   HPI. Had rectal bleeding and presented to the ED at [facility]. Evaluated and  Sent home as Dx of bleeding hemorrhoids. Still has little streaks of blood. Has constipation. No weight loss. Was Rx a suppository and told to use miralax. Which he has had to use from time to time due to constipation.He also uses metamucil.came for follow up as directed.  Past Medical History  Diagnosis Date  . GERD (gastroesophageal reflux disease)   . Essential hypertension, benign   . BPH (benign prostatic hyperplasia)   . Lumbar herniated disc     L4-5  . Hyperlipidemia   . Sleep apnea   . Multiple myeloma 02/19/2008  . Arthritis   . Diverticulosis   . Peptic ulcer disease   . Tubular adenoma 02/16/2008    Dr. Stan Head   Past Surgical History  Procedure Laterality Date  . Neck surgery    . Lumbar laminectomy/decompression microdiscectomy  11/01/2011    Procedure: LUMBAR LAMINECTOMY/DECOMPRESSION MICRODISCECTOMY 2 LEVELS;  Surgeon: Temple Pacini, MD;  Location: [facility];  Service: Neurosurgery;  Laterality: Right;  Lumbar Laminectomy/Microdiscectomy Decompression Lumbar Three-Four, Lumbar Four-Five   . Colonoscopy     History   Social History  . Marital Status: Married    Spouse Name: N/A    Number of Children: 4  . Years of Education: N/A   Occupational History  . retired-disabled-Construction    Social History Main Topics  . Smoking status: Former Smoker -- 2.00 packs/day for 20 years    Types: Cigarettes    Quit date: 07/12/1990  . Smokeless tobacco: Never Used  . Alcohol Use: No  . Drug Use: No  . Sexually Active: Not on file   Other Topics Concern  . Not on file   Social History Narrative   Pt was adopted.   Family History  Problem Relation Age of Onset  . Adopted: Yes   Current  Outpatient Prescriptions on File Prior to Visit  Medication Sig Dispense Refill  . acyclovir (ZOVIRAX) 400 MG tablet Take 400 mg by mouth 2 (two) times daily.      Marland Kitchen amLODipine (NORVASC) 5 MG tablet Take 1 tablet (5 mg total) by mouth daily.  30 tablet  4  . aspirin 325 MG EC tablet Take 1 tablet (325 mg total) by mouth 2 (two) times daily.      Marland Kitchen atorvastatin (LIPITOR) 20 MG tablet Take 1 tablet (20 mg total) by mouth daily.  30 tablet  3  . co-enzyme Q-10 50 MG capsule Take 4 capsules (200 mg total) by mouth daily.  120 capsule  11  . cyclophosphamide (CYTOXAN) 50 MG tablet Take 12 tablets (600 mg total) by mouth once a week. Give on an empty stomach 1 hour before or 2 hours after meals.  48 tablet  1  . dexamethasone (DECADRON) 4 MG tablet Take 20-40 mg by mouth once a week. Taken on Thursdays; 5 tabs on the weeks he does not get treatment at cancer center, otherwise 10.      . hydrocortisone (ANUSOL-HC) 25 MG suppository Place 1 suppository (25 mg total) rectally 2 (two) times daily. For 7 days  14 suppository  0  . lactulose (CHRONULAC) 10 GM/15ML solution Take 15 mLs (10 g total) by  mouth 2 (two) times daily as needed (constipation).  240 mL  0  . magnesium citrate SOLN Take 296 mLs (1 Bottle total) by mouth once.  195 mL  0  . nitroGLYCERIN (NITROSTAT) 0.4 MG SL tablet Place 1 tablet (0.4 mg total) under the tongue every 5 (five) minutes as needed for chest pain.  25 tablet  3  . omeprazole (PRILOSEC) 20 MG capsule Take 1 capsule (20 mg total) by mouth daily.  30 capsule  4  . polyethylene glycol (MIRALAX / GLYCOLAX) packet Take 17 g by mouth every evening.      Marland Kitchen PRESCRIPTION MEDICATION Inject as directed every 7 (seven) days. Velcade 3mg  injection q7d on Thursdays, skips every 4th week.      . prochlorperazine (COMPAZINE) 10 MG tablet Take 10 mg by mouth every 6 (six) hours as needed (for nausea).      . tamsulosin (FLOMAX) 0.4 MG CAPS Take 1 capsule (0.4 mg total) by mouth 2 (two) times  daily with a meal.  60 capsule  5  . zolpidem (AMBIEN) 10 MG tablet Take 5 mg by mouth at bedtime as needed for sleep.       No current facility-administered medications on file prior to visit.   Allergies  Allergen Reactions  . Penicillins Hives and Rash   Immunization History  Administered Date(s) Administered  . Influenza Split 04/23/2011, 04/11/2012   Prior to Admission medications   Medication Sig Start Date End Date Taking? Authorizing Provider  acyclovir (ZOVIRAX) 400 MG tablet Take 400 mg by mouth 2 (two) times daily.   Yes Historical Provider, MD  amLODipine (NORVASC) 5 MG tablet Take 1 tablet (5 mg total) by mouth daily. 10/12/12  Yes Ileana Ladd, MD  aspirin 325 MG EC tablet Take 1 tablet (325 mg total) by mouth 2 (two) times daily. 12/07/12  Yes Prescott Parma, PA-C  atorvastatin (LIPITOR) 20 MG tablet Take 1 tablet (20 mg total) by mouth daily. 02/02/13  Yes Ileana Ladd, MD  co-enzyme Q-10 50 MG capsule Take 4 capsules (200 mg total) by mouth daily. 01/31/13  Yes Ileana Ladd, MD  cyclophosphamide (CYTOXAN) 50 MG tablet Take 12 tablets (600 mg total) by mouth once a week. Give on an empty stomach 1 hour before or 2 hours after meals. 01/25/13  Yes Ladene Artist, MD  dexamethasone (DECADRON) 4 MG tablet Take 20-40 mg by mouth once a week. Taken on Thursdays; 5 tabs on the weeks he does not get treatment at cancer center, otherwise 10.   Yes Historical Provider, MD  hydrocortisone (ANUSOL-HC) 25 MG suppository Place 1 suppository (25 mg total) rectally 2 (two) times daily. For 7 days 02/10/13  Yes Gilda Crease, MD  lactulose (CHRONULAC) 10 GM/15ML solution Take 15 mLs (10 g total) by mouth 2 (two) times daily as needed (constipation). 02/10/13  Yes Gilda Crease, MD  magnesium citrate SOLN Take 296 mLs (1 Bottle total) by mouth once. 02/10/13  Yes Gilda Crease, MD  nitroGLYCERIN (NITROSTAT) 0.4 MG SL tablet Place 1 tablet (0.4 mg total) under the tongue  every 5 (five) minutes as needed for chest pain. 12/07/12  Yes Prescott Parma, PA-C  omeprazole (PRILOSEC) 20 MG capsule Take 1 capsule (20 mg total) by mouth daily. 10/06/12  Yes Ileana Ladd, MD  polyethylene glycol (MIRALAX / GLYCOLAX) packet Take 17 g by mouth every evening.   Yes Historical Provider, MD  PRESCRIPTION MEDICATION Inject as directed every 7 (seven)  days. Velcade 3mg  injection q7d on Thursdays, skips every 4th week.   Yes Historical Provider, MD  prochlorperazine (COMPAZINE) 10 MG tablet Take 10 mg by mouth every 6 (six) hours as needed (for nausea).   Yes Historical Provider, MD  tamsulosin (FLOMAX) 0.4 MG CAPS Take 1 capsule (0.4 mg total) by mouth 2 (two) times daily with a meal. 12/18/12  Yes Ileana Ladd, MD  zolpidem (AMBIEN) 10 MG tablet Take 5 mg by mouth at bedtime as needed for sleep.   Yes Historical Provider, MD     ROS: As above in the HPI. All other systems are stable or negative.  OBJECTIVE: APPEARANCE:  Patient in no acute distress.The patient appeared well nourished and normally developed. Acyanotic. Waist: VITAL SIGNS:BP 120/74  Pulse 76  Temp(Src) 97.4 F (36.3 C) (Oral)  Ht 6' (1.829 m)  Wt 260 lb (117.935 kg)  BMI 35.25 kg/m2 AAM.  SKIN: warm and  Dry without overt rashes, tattoos and scars  HEAD and Neck: without JVD, Head and scalp: normal Eyes:No scleral icterus. Fundi normal, eye movements normal. Ears: Auricle normal, canal normal, Tympanic membranes normal, insufflation normal. Nose: normal Throat: normal Neck & thyroid: normal  CHEST & LUNGS: Chest wall: normal Lungs: Clear  CVS: Reveals the PMI to be normally located. Regular rhythm, First and Second Heart sounds are normal,  absence of murmurs, rubs or gallops.  ABDOMEN:  Appearance: obese Benign, no organomegaly, no masses, no Abdominal Aortic enlargement. No Guarding , no rebound. No Bruits. Bowel sounds: normal  RECTAL:large hemorrhoidal tags.rectum empty but streaks  of blood on glove. Hemorrhoids felt .  GU: N/A  EXTREMETIES: nonedematous. Both Femoral and Pedal pulses are normal.  Results for orders placed in visit on 02/12/13  POCT CBC      Result Value Range   WBC 5.0  4.6 - 10.2 K/uL   Lymph, poc 1.2  0.6 - 3.4   POC LYMPH PERCENT 24.3  10 - 50 %L   POC Granulocyte 3.5  2 - 6.9   Granulocyte percent 69.6  37 - 80 %G   RBC 4.4 (*) 4.69 - 6.13 M/uL   Hemoglobin 13.0 (*) 14.1 - 18.1 g/dL   HCT, POC 16.1 (*) 09.6 - 53.7 %   MCV 88.1  80 - 97 fL   MCH, POC 29.4  27 - 31.2 pg   MCHC 33.4  31.8 - 35.4 g/dL   RDW, POC 04.5     Platelet Count, POC 121.0 (*) 142 - 424 K/uL   MPV 9.3  0 - 99.8 fL    ASSESSMENT: Rectal bleeding - Plan: POCT CBC, Ambulatory referral to Gastroenterology  Multiple myeloma  Essential hypertension, benign  Constipation  Internal and external bleeding hemorrhoids  PLAN: Discussed high fiber diet with adequate water intake. Continue with the suppositories and the miralax.  Orders Placed This Encounter  Procedures  . Ambulatory referral to Gastroenterology    Referral Priority:  Routine    Referral Type:  Consultation    Referral Reason:  Specialty Services Required    Requested Specialty:  Gastroenterology    Number of Visits Requested:  1  . POCT CBC   Return in about 1 week (around 02/19/2013) for recheck rectal bleeding.  Chenay Nesmith P. Modesto Charon, M.D.

## 2013-02-13 ENCOUNTER — Other Ambulatory Visit: Payer: Self-pay | Admitting: Oncology

## 2013-02-13 ENCOUNTER — Other Ambulatory Visit: Payer: Self-pay | Admitting: Family Medicine

## 2013-02-13 DIAGNOSIS — C9000 Multiple myeloma not having achieved remission: Secondary | ICD-10-CM

## 2013-02-14 ENCOUNTER — Telehealth: Payer: Self-pay | Admitting: Internal Medicine

## 2013-02-14 NOTE — Telephone Encounter (Signed)
Spoke with patient's wife. He wants to be seen for rectal bleeding and hemorrhoids. Scheduled with Willette Cluster, NP on 02/15/13 at 3:00 pm.

## 2013-02-15 ENCOUNTER — Ambulatory Visit (HOSPITAL_BASED_OUTPATIENT_CLINIC_OR_DEPARTMENT_OTHER): Payer: Medicare Other

## 2013-02-15 ENCOUNTER — Other Ambulatory Visit (HOSPITAL_BASED_OUTPATIENT_CLINIC_OR_DEPARTMENT_OTHER): Payer: Medicare Other

## 2013-02-15 ENCOUNTER — Ambulatory Visit: Payer: Self-pay | Admitting: Nurse Practitioner

## 2013-02-15 VITALS — BP 117/72 | HR 84 | Temp 97.2°F

## 2013-02-15 DIAGNOSIS — C9000 Multiple myeloma not having achieved remission: Secondary | ICD-10-CM

## 2013-02-15 DIAGNOSIS — Z5112 Encounter for antineoplastic immunotherapy: Secondary | ICD-10-CM

## 2013-02-15 LAB — CBC WITH DIFFERENTIAL/PLATELET
BASO%: 0.6 % (ref 0.0–2.0)
Basophils Absolute: 0 10*3/uL (ref 0.0–0.1)
EOS%: 1.8 % (ref 0.0–7.0)
HGB: 12.5 g/dL — ABNORMAL LOW (ref 13.0–17.1)
MCH: 30.2 pg (ref 27.2–33.4)
RDW: 17 % — ABNORMAL HIGH (ref 11.0–14.6)
lymph#: 1 10*3/uL (ref 0.9–3.3)

## 2013-02-15 LAB — COMPREHENSIVE METABOLIC PANEL (CC13)
ALT: 7 U/L (ref 0–55)
AST: 17 U/L (ref 5–34)
Albumin: 3.3 g/dL — ABNORMAL LOW (ref 3.5–5.0)
Calcium: 9.5 mg/dL (ref 8.4–10.4)
Chloride: 107 mEq/L (ref 98–109)
Potassium: 3.8 mEq/L (ref 3.5–5.1)
Sodium: 138 mEq/L (ref 136–145)
Total Protein: 6.6 g/dL (ref 6.4–8.3)

## 2013-02-15 MED ORDER — DEXAMETHASONE 4 MG PO TABS
40.0000 mg | ORAL_TABLET | Freq: Once | ORAL | Status: AC
  Administered 2013-02-15: 40 mg via ORAL

## 2013-02-15 MED ORDER — BORTEZOMIB CHEMO SQ INJECTION 3.5 MG (2.5MG/ML)
1.3000 mg/m2 | Freq: Once | INTRAMUSCULAR | Status: AC
  Administered 2013-02-15: 3 mg via SUBCUTANEOUS
  Filled 2013-02-15: qty 3

## 2013-02-15 MED ORDER — ONDANSETRON HCL 8 MG PO TABS
8.0000 mg | ORAL_TABLET | Freq: Once | ORAL | Status: AC
  Administered 2013-02-15: 8 mg via ORAL

## 2013-02-15 NOTE — Patient Instructions (Signed)
[facility] Discharge Instructions for Patients Receiving Chemotherapy  Today you received the following chemotherapy agents: Velcade.  To help prevent nausea and vomiting after your treatment, we encourage you to take your nausea medication as prescribed.   If you develop nausea and vomiting that is not controlled by your nausea medication, call the clinic.   BELOW ARE SYMPTOMS THAT SHOULD BE REPORTED IMMEDIATELY:  *FEVER GREATER THAN 100.5 F  *CHILLS WITH OR WITHOUT FEVER  NAUSEA AND VOMITING THAT IS NOT CONTROLLED WITH YOUR NAUSEA MEDICATION  *UNUSUAL SHORTNESS OF BREATH  *UNUSUAL BRUISING OR BLEEDING  TENDERNESS IN MOUTH AND THROAT WITH OR WITHOUT PRESENCE OF ULCERS  *URINARY PROBLEMS  *BOWEL PROBLEMS  UNUSUAL RASH Items with * indicate a potential emergency and should be followed up as soon as possible.  Feel free to call the clinic you have any questions or concerns. The clinic phone number is [phone].    

## 2013-02-16 ENCOUNTER — Ambulatory Visit (INDEPENDENT_AMBULATORY_CARE_PROVIDER_SITE_OTHER): Payer: Medicare Other | Admitting: Nurse Practitioner

## 2013-02-16 ENCOUNTER — Encounter: Payer: Self-pay | Admitting: Internal Medicine

## 2013-02-16 ENCOUNTER — Encounter: Payer: Self-pay | Admitting: Nurse Practitioner

## 2013-02-16 VITALS — BP 120/78 | HR 88 | Ht 72.0 in | Wt 259.6 lb

## 2013-02-16 DIAGNOSIS — K648 Other hemorrhoids: Secondary | ICD-10-CM

## 2013-02-16 DIAGNOSIS — K644 Residual hemorrhoidal skin tags: Secondary | ICD-10-CM

## 2013-02-16 DIAGNOSIS — Z86010 Personal history of colonic polyps: Secondary | ICD-10-CM

## 2013-02-16 MED ORDER — NA SULFATE-K SULFATE-MG SULF 17.5-3.13-1.6 GM/177ML PO SOLN: 1.0000 | Freq: Once | ORAL | Status: DC

## 2013-02-16 NOTE — Progress Notes (Signed)
  History of Present Illness:   Patient is a 77 year old male with multiple myeloma. He is known to Dr. Leone Payor for history of adenomatous polyps.  His last surveillance colonoscopy was August 2009. Findings included diverticulosis, internal hemorrhoids and 2 small adenomatous polyps  Patient was hospitalized September of last year with painless rectal bleeding felt to be hemorrhoid related. Flexible sigmoidoscopy with banding was scheduled but not done as hemorrhoids were not felt to be amenable to banding (grade IV, non-reducible). Surgery was consulted, patient was seen by Dr. Dwain Sarna. Bleeding had ceased by that time, hemoglobin was stable and surgery felt outpatient follow up was appropriate. .  At his last visit October 2013 patient was prescribed MiraLax and hydrocortisone cream. He comes in today for recurrent hemorrhoidal bleeding. Bleeding started last Wednesday. Because of persistent painless bleeding patient went to ED on Saturday. White count was 5.2 hemoglobin 12.5 He was released from ED with steroid suppositories. Bleeding has resolved, yesterday he hada normal BM. Patient had been skipping some of his daily Metamucil and Miralax doses at home.  Current Medications, Allergies, Past Medical History, Past Surgical History, Family History and Social History were reviewed in [employer] record.  Physical Exam: General: Well developed , black male in no acute distress Head: Normocephalic and atraumatic Eyes:  sclerae anicteric, conjunctiva pink  Ears: Normal auditory acuity Lungs: Clear throughout to auscultation Heart: Regular rate and rhythm Abdomen: Soft, non distended, non-tender. No masses, no hepatomegaly. Normal bowel sounds Rectal: Nonreducible external hemorrhoids. On anoscopy there were internal hemorrhoids as well Musculoskeletal: Symmetrical with no gross deformities  Extremities: 2+ Bilateral lower extremity edema  Neurological: Alert oriented x  4, grossly nonfocal Psychological:  Alert and cooperative. Normal mood and affect  Assessment and Recommendations:  1. Recurrent painless bleeding, likely hemorrhoidal. Using steroid suppositories, bleeding has now resolved. He shuld complete course of steroid suppositories as prescribed by the emergency department. I stressed importance of avoiding constipation/straining. He can increase MiraLax to twice daily if needed. Patient is due for surveillance colonoscopy, Will discuss with Dr. Leone Payor whether banding of internal hemorrhoids at time of procedure wil be appropriate.   2. History of adenomatous colon polyps. Patient due for his surveillance colonoscopy. The risks, benefits, and alternatives to colonoscopy with possible biopsy and possible polypectomy were discussed with the patient and he consents to proceed.   3. Multiple myeloma, currently treated with Cytoxan and Velcade.

## 2013-02-16 NOTE — Patient Instructions (Addendum)
Use Miralax twice daily for constipation.   Use stool softners if you need to , to keep the stools soft. We have given you the sample kit for the Suprep, colonoscopy prep.  You have been scheduled for a colonoscopy with propofol. Please follow written instructions given to you at your visit today.   If you use inhalers (even only as needed), please bring them with you on the day of your procedure.

## 2013-02-19 LAB — PROTEIN ELECTROPHORESIS, SERUM
Gamma Globulin: 16.5 % (ref 11.1–18.8)
M-Spike, %: 0.51 g/dL

## 2013-02-19 LAB — IGG: IgG (Immunoglobin G), Serum: 1040 mg/dL (ref 650–1600)

## 2013-02-19 NOTE — Progress Notes (Signed)
Agree with Ms. Guenther's assessment and plan. It is not possible to ligate the hemorrhoids at time of colonoscopy in the [facility]. Iva Boop, MD, Clementeen Graham

## 2013-02-22 ENCOUNTER — Other Ambulatory Visit: Payer: Self-pay | Admitting: Family Medicine

## 2013-02-22 ENCOUNTER — Encounter: Payer: Self-pay | Admitting: Oncology

## 2013-02-22 ENCOUNTER — Ambulatory Visit (HOSPITAL_BASED_OUTPATIENT_CLINIC_OR_DEPARTMENT_OTHER): Payer: Medicare Other

## 2013-02-22 ENCOUNTER — Other Ambulatory Visit (HOSPITAL_BASED_OUTPATIENT_CLINIC_OR_DEPARTMENT_OTHER): Payer: Medicare Other | Admitting: Lab

## 2013-02-22 ENCOUNTER — Ambulatory Visit (HOSPITAL_BASED_OUTPATIENT_CLINIC_OR_DEPARTMENT_OTHER): Payer: Medicare Other | Admitting: Oncology

## 2013-02-22 ENCOUNTER — Telehealth: Payer: Self-pay | Admitting: Oncology

## 2013-02-22 VITALS — BP 124/72 | HR 96 | Temp 97.5°F | Resp 18 | Ht 73.0 in | Wt 258.9 lb

## 2013-02-22 DIAGNOSIS — C9000 Multiple myeloma not having achieved remission: Secondary | ICD-10-CM

## 2013-02-22 DIAGNOSIS — R209 Unspecified disturbances of skin sensation: Secondary | ICD-10-CM

## 2013-02-22 DIAGNOSIS — R42 Dizziness and giddiness: Secondary | ICD-10-CM

## 2013-02-22 DIAGNOSIS — Z5112 Encounter for antineoplastic immunotherapy: Secondary | ICD-10-CM

## 2013-02-22 LAB — CBC WITH DIFFERENTIAL/PLATELET
BASO%: 0.6 % (ref 0.0–2.0)
EOS%: 1.6 % (ref 0.0–7.0)
HCT: 34 % — ABNORMAL LOW (ref 38.4–49.9)
LYMPH%: 14.9 % (ref 14.0–49.0)
MCH: 30.3 pg (ref 27.2–33.4)
MCHC: 35 g/dL (ref 32.0–36.0)
MONO%: 14.9 % — ABNORMAL HIGH (ref 0.0–14.0)
NEUT%: 68 % (ref 39.0–75.0)
Platelets: 104 10*3/uL — ABNORMAL LOW (ref 140–400)
RBC: 3.92 10*6/uL — ABNORMAL LOW (ref 4.20–5.82)

## 2013-02-22 MED ORDER — BORTEZOMIB CHEMO SQ INJECTION 3.5 MG (2.5MG/ML)
1.3000 mg/m2 | Freq: Once | INTRAMUSCULAR | Status: AC
  Administered 2013-02-22: 3 mg via SUBCUTANEOUS
  Filled 2013-02-22: qty 3

## 2013-02-22 MED ORDER — DEXAMETHASONE 4 MG PO TABS
40.0000 mg | ORAL_TABLET | Freq: Once | ORAL | Status: AC
  Administered 2013-02-22: 40 mg via ORAL

## 2013-02-22 MED ORDER — ONDANSETRON HCL 8 MG PO TABS
8.0000 mg | ORAL_TABLET | Freq: Once | ORAL | Status: AC
  Administered 2013-02-22: 8 mg via ORAL

## 2013-02-22 NOTE — Telephone Encounter (Signed)
Gave pt appt for lab and ML, emailed Michelle regading chemo for August and September 2014

## 2013-02-22 NOTE — Progress Notes (Signed)
Called [facility] to confirm he still has #1 refill on his Cytoxan. Requested they begin to process this refill now at wife's request.

## 2013-02-22 NOTE — Patient Instructions (Signed)
[facility] Discharge Instructions for Patients Receiving Chemotherapy  Today you received the following chemotherapy agents: Velcade.  To help prevent nausea and vomiting after your treatment, we encourage you to take your nausea medication as prescribed.   If you develop nausea and vomiting that is not controlled by your nausea medication, call the clinic.   BELOW ARE SYMPTOMS THAT SHOULD BE REPORTED IMMEDIATELY:  *FEVER GREATER THAN 100.5 F  *CHILLS WITH OR WITHOUT FEVER  NAUSEA AND VOMITING THAT IS NOT CONTROLLED WITH YOUR NAUSEA MEDICATION  *UNUSUAL SHORTNESS OF BREATH  *UNUSUAL BRUISING OR BLEEDING  TENDERNESS IN MOUTH AND THROAT WITH OR WITHOUT PRESENCE OF ULCERS  *URINARY PROBLEMS  *BOWEL PROBLEMS  UNUSUAL RASH Items with * indicate a potential emergency and should be followed up as soon as possible.  Feel free to call the clinic you have any questions or concerns. The clinic phone number is [phone].    

## 2013-02-22 NOTE — Progress Notes (Signed)
[facility]    OFFICE PROGRESS NOTE   INTERVAL HISTORY:   [name] returns as scheduled. He continues Cytoxan/Velcade/Decadron. He has chronic "dizziness ". He has noted increased shortness of breath with exertion recently. He was recently seen in the ER with rectal bleeding. He has been referred to gastroenterology. Numbness in the fingertips bilaterally  Objective:  Vital signs in last 24 hours:  Blood pressure 124/72, pulse 96, temperature 97.5 F (36.4 C), temperature source Oral, resp. rate 18, height 6\' 1"  (1.854 m), weight 258 lb 14.4 oz (117.436 kg).    HEENT: No thrush or ulcers Resp: Lungs clear bilaterally Cardio: Regular rate and rhythm GI: No hepatosplenomegaly Vascular: No leg edema Neurologic: Moderate decrease in vibratory sense at the fingertips bilaterally   Lab Results:  Lab Results  Component Value Date   WBC 5.4 02/22/2013   HGB 11.9* 02/22/2013   HCT 34.0* 02/22/2013   MCV 86.6 02/22/2013   PLT 104* 02/22/2013   ANC 3.6   02/15/2013-IgG 1040, serum M spike 0.51   Medications: I have reviewed the patient's current medications.  Assessment/Plan: 1. Multiple myeloma, status post treatment with melphalan/prednisone/thalidomide beginning in November 2009. The thalidomide was increased to 200 mg daily beginning 09/11/2008. The serum M spike was slightly improved on 03/18/2009 and slightly increased on 05/09/2009. He has been maintained off of specific therapy for multiple myeloma since September 2010. The serum M spike was increased on 10/20/2011 at 4.6. He began Revlimid on 11/11/2011. He takes weekly dexamethasone. He began cycle 2 of Revlimid on 12/09/2011. The serum M spike was improved on 12/31/2011. The serum M spike was slightly higher on 01/28/2012. He began cycle 4 on 02/03/2012. He developed recurrent hypercalcemia. Treatment was changed to Cytoxan/Velcade/Decadron beginning 03/02/2012. The serum M spike was lower on 02/25/2012. The  serum M spike was stable on 06/09/2012. The serum M spike improved on 08/03/2012 and stable on 08/31/2012. The IgG level was stable on 08/31/2012. The IgG level and serum M spike were improved on 10/05/2012. The IgG level and serum M spike further improved on 11/02/2012 and 11/30/2012. IgG and serum M spike were overall stable on 12/28/2012. 2. Chronic "dizziness." 3. Chest x-ray 11/01/2008 with a patchy opacity at the right midlung suspicious for pneumonia. He was treated with Avelox. 4. Low back pain with a radicular component. An MRI on 10/07/2008 showed no involvement of the lumbosacral spine with myeloma. 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 5. Bilateral neck pain, status post a CT 05/17/2008 with findings of spinal stenosis and osteoarthritis. 6. 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 Red cell microcytosis, likely related to hemoglobin C trait. A ferritin level returned elevated and a stool Hemoccult was negative 01/23/2010. He underwent a colonoscopy in 2009. 8. Elevated beta-2 microglobulin level. 9. History of hypertension. 10. History of a herniated disk at L4-L5 on MRI an 01/11/2002. 11. Hyperlipidemia. 12. Gastroesophageal reflux disease. 13. History of atypical angina. 14. History of rectal bleeding-large external hemorrhoids noted 02/25/2012. The stool was Hemoccult positive. He has a history of colon polyps. He is followed by [facility]. He was diagnosed with hemorrhoids in September of 2013 while hospitalized. 15. Superficial venous thrombosis of the greater saphenous vein on a Doppler 09/27/2008. Negative for deep vein thrombosis. Symptoms improved with aspirin. 16. Neck pain with numbness/tingling in the arms, hands, and low back with proximal right  leg weakness. An MRI of the cervical spine 05/30/2009 showed multilevel  spondylosis with mild cord edema at C4-C5 and C5-C6. He was noted to have an enhancing lesion of the cord at T3-T4 of unclear etiology. In the lumbar spine there was no change in the spondylosis at L3-L4 and L4-L5. There was no involvement of the cervical or lumbar spine with myeloma. He is status post C4-C5, C5-C6, and C6-C7 anterior cervical diskectomy with fusion by Dr. Jordan Likes 08/01/2009. 17. History of mild thrombocytopenia. 18. Indeterminate-age deep vein thrombosis of the left lower extremity on a venous Doppler 08/15/2009. There were also findings consistent with superficial thrombosis involving the right lower extremity 08/15/2009. Coumadin was discontinued in October 2011. 19. Type 2 diabetes. 20. History of hematuria, followed at [facility]. Urinalysis was negative for blood on 07/23/2011. 21. Radicular back pain with MRI of the lumbar spine on 10/19/2011 showing nerve root impingement at L2-L3, L3-L4 and L4-L5 with the most severe at the right L3 nerve roots due to a large disc fragment. Associated right leg weakness. He underwent right L2-3 decompressive laminotomy with right L2 and L3 decompressive foraminotomy; right L2-3 microdiscectomy on 11/01/2011. 22. Constipation He continues a laxative regimen. 23. Hospitalization 11/08/2011 through 11/10/2011 with orthostatic hypotension/dizziness. He improved with intravenous hydration. He had mild hypotension when here on 11/18/2011. We recommended discontinuing Norvasc and Proscar. 24. Hypercalcemia status post pamidronate 11/03/2011. Recurrent hypercalcemia 02/16/2012 status post Zometa. The calcium has remained in normal range. 25. Fractured tooth with associated pain. Status post a tooth extraction by Dr. Kristin Bruins, he is to be scheduled for multiple tooth extractions in the near future. Zometa has been placed on hold. 26. Exertional dyspnea. Stable. He has been evaluated by cardiology. 27. 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 He will continue the current treatment. [name] will return for an office visit in one month.   Thornton Papas, MD  02/22/2013  5:16 PM

## 2013-02-23 ENCOUNTER — Encounter: Payer: Self-pay | Admitting: Family Medicine

## 2013-02-23 ENCOUNTER — Telehealth: Payer: Self-pay | Admitting: *Deleted

## 2013-02-23 ENCOUNTER — Other Ambulatory Visit: Payer: Self-pay | Admitting: Family Medicine

## 2013-02-23 ENCOUNTER — Ambulatory Visit (INDEPENDENT_AMBULATORY_CARE_PROVIDER_SITE_OTHER): Payer: Medicare Other | Admitting: Family Medicine

## 2013-02-23 VITALS — BP 128/77 | HR 100 | Temp 97.7°F | Wt 256.8 lb

## 2013-02-23 DIAGNOSIS — E782 Mixed hyperlipidemia: Secondary | ICD-10-CM

## 2013-02-23 DIAGNOSIS — K5900 Constipation, unspecified: Secondary | ICD-10-CM

## 2013-02-23 DIAGNOSIS — G4733 Obstructive sleep apnea (adult) (pediatric): Secondary | ICD-10-CM

## 2013-02-23 DIAGNOSIS — I10 Essential (primary) hypertension: Secondary | ICD-10-CM

## 2013-02-23 DIAGNOSIS — K625 Hemorrhage of anus and rectum: Secondary | ICD-10-CM

## 2013-02-23 DIAGNOSIS — C9000 Multiple myeloma not having achieved remission: Secondary | ICD-10-CM

## 2013-02-23 DIAGNOSIS — K648 Other hemorrhoids: Secondary | ICD-10-CM

## 2013-02-23 DIAGNOSIS — K644 Residual hemorrhoidal skin tags: Secondary | ICD-10-CM

## 2013-02-23 NOTE — Progress Notes (Signed)
Patient ID: [name], male   DOB: Sep 01, 1935, 77 y.o.   MRN: [id number] SUBJECTIVE: CC: Chief Complaint  Patient presents with  . Follow-up    labs and BP ck  hemorrhoids better -colonscopy sch 03-06-13  . Medication Refill    refill ambien     HPI: 1) here for follow up of rectal bleeding. Scheduled for colonoscopy in 10 days. It has stopped bleeding.  2) recheck on other problems especially BP.: denies Headache;deniesChest Pain; Weakness and  Dizziness when he gets up and when he is walking around outside. Head feels funny and light headed denies Shortness of Breath or Orthopnea;denies Visual changes;denies palpitations;denies cough;denies pedal edema;denies symptoms of TIA or stroke; admits to Compliance with medications. denies Problems with medications. He has had a myoview stress test in May. EKG recently was normal.  3) multiple myeloma : close to completing chemotherapy.: doing well.  Past Medical History  Diagnosis Date  . GERD (gastroesophageal reflux disease)   . Essential hypertension, benign   . BPH (benign prostatic hyperplasia)   . Lumbar herniated disc     L4-5  . Hyperlipidemia   . Sleep apnea   . Multiple myeloma 02/19/2008  . Arthritis   . Diverticulosis   . Peptic ulcer disease   . Tubular adenoma 02/16/2008    Dr. Stan Head   Past Surgical History  Procedure Laterality Date  . Neck surgery    . Lumbar laminectomy/decompression microdiscectomy  11/01/2011    Procedure: LUMBAR LAMINECTOMY/DECOMPRESSION MICRODISCECTOMY 2 LEVELS;  Surgeon: Temple Pacini, MD;  Location: [facility];  Service: Neurosurgery;  Laterality: Right;  Lumbar Laminectomy/Microdiscectomy Decompression Lumbar Three-Four, Lumbar Four-Five   . Colonoscopy     History   Social History  . Marital Status: Married    Spouse Name: N/A    Number of Children: 4  . Years of Education: N/A   Occupational History  . retired-disabled-Construction    Social History Main Topics  .  Smoking status: Former Smoker -- 2.00 packs/day for 20 years    Types: Cigarettes    Quit date: 07/12/1990  . Smokeless tobacco: Never Used  . Alcohol Use: No  . Drug Use: No  . Sexual Activity: Not on file   Other Topics Concern  . Not on file   Social History Narrative   Pt was adopted.   Married   Probation officer   Family History  Problem Relation Age of Onset  . Adopted: Yes   Current Outpatient Prescriptions on File Prior to Visit  Medication Sig Dispense Refill  . acyclovir (ZOVIRAX) 400 MG tablet TAKE 1 TABLET (400 MG TOTAL) BY MOUTH 2 (TWO) TIMES DAILY.  60 tablet  2  . amLODipine (NORVASC) 5 MG tablet Take 1 tablet (5 mg total) by mouth daily.  30 tablet  4  . aspirin 325 MG EC tablet Take 1 tablet (325 mg total) by mouth 2 (two) times daily.      Marland Kitchen atorvastatin (LIPITOR) 20 MG tablet Take 1 tablet (20 mg total) by mouth daily.  30 tablet  3  . co-enzyme Q-10 50 MG capsule Take 4 capsules (200 mg total) by mouth daily.  120 capsule  11  . cyclophosphamide (CYTOXAN) 50 MG tablet Take 12 tablets (600 mg total) by mouth once a week. Give on an empty stomach 1 hour before or 2 hours after meals.  48 tablet  1  . dexamethasone (DECADRON) 4 MG tablet Take 20-40 mg by mouth once a week.  Taken on Thursdays; 5 tabs on the weeks he does not get treatment at cancer center, otherwise 10.      . hydrocortisone (ANUSOL-HC) 25 MG suppository Place 1 suppository (25 mg total) rectally 2 (two) times daily. For 7 days  14 suppository  0  . Na Sulfate-K Sulfate-Mg Sulf SOLN Take 1 kit by mouth once.  354 mL  0  . NITROSTAT 0.4 MG SL tablet Place 0.4 mg under the tongue as needed.      Marland Kitchen omeprazole (PRILOSEC) 20 MG capsule Take 1 capsule (20 mg total) by mouth daily.  30 capsule  4  . polyethylene glycol (MIRALAX / GLYCOLAX) packet Take 17 g by mouth 2 (two) times daily.       . potassium chloride SA (KLOR-CON M20) 20 MEQ tablet TAKE 1 TABLET EVERY DAY  30 tablet  0  . PRESCRIPTION MEDICATION  Inject as directed every 7 (seven) days. Velcade 3mg  injection q7d on Thursdays, skips every 4th week.      . prochlorperazine (COMPAZINE) 10 MG tablet Take 10 mg by mouth every 6 (six) hours as needed (for nausea).      . tamsulosin (FLOMAX) 0.4 MG CAPS Take 1 capsule (0.4 mg total) by mouth 2 (two) times daily with a meal.  60 capsule  5  . zolpidem (AMBIEN) 10 MG tablet Take 5 mg by mouth at bedtime as needed for sleep.       No current facility-administered medications on file prior to visit.   Allergies  Allergen Reactions  . Penicillins Hives and Rash   Immunization History  Administered Date(s) Administered  . Influenza Split 04/23/2011, 04/11/2012  . Tdap 07/13/2007   Prior to Admission medications   Medication Sig Start Date End Date Taking? Authorizing Provider  acyclovir (ZOVIRAX) 400 MG tablet TAKE 1 TABLET (400 MG TOTAL) BY MOUTH 2 (TWO) TIMES DAILY. 02/13/13  Yes Ladene Artist, MD  amLODipine (NORVASC) 5 MG tablet Take 1 tablet (5 mg total) by mouth daily. 10/12/12  Yes Ileana Ladd, MD  aspirin 325 MG EC tablet Take 1 tablet (325 mg total) by mouth 2 (two) times daily. 12/07/12  Yes Prescott Parma, PA-C  atorvastatin (LIPITOR) 20 MG tablet Take 1 tablet (20 mg total) by mouth daily. 02/02/13  Yes Ileana Ladd, MD  co-enzyme Q-10 50 MG capsule Take 4 capsules (200 mg total) by mouth daily. 01/31/13  Yes Ileana Ladd, MD  cyclophosphamide (CYTOXAN) 50 MG tablet Take 12 tablets (600 mg total) by mouth once a week. Give on an empty stomach 1 hour before or 2 hours after meals. 01/25/13  Yes Ladene Artist, MD  dexamethasone (DECADRON) 4 MG tablet Take 20-40 mg by mouth once a week. Taken on Thursdays; 5 tabs on the weeks he does not get treatment at cancer center, otherwise 10.   Yes Historical Provider, MD  hydrocortisone (ANUSOL-HC) 25 MG suppository Place 1 suppository (25 mg total) rectally 2 (two) times daily. For 7 days 02/10/13  Yes Gilda Crease, MD  Na Sulfate-K  Sulfate-Mg Sulf SOLN Take 1 kit by mouth once. 02/16/13 03/18/13 Yes Meredith Pel, NP  NITROSTAT 0.4 MG SL tablet Place 0.4 mg under the tongue as needed. 12/07/12  Yes Historical Provider, MD  omeprazole (PRILOSEC) 20 MG capsule Take 1 capsule (20 mg total) by mouth daily. 10/06/12  Yes Ileana Ladd, MD  polyethylene glycol (MIRALAX / GLYCOLAX) packet Take 17 g by mouth 2 (two) times daily.  Yes Historical Provider, MD  potassium chloride SA (KLOR-CON M20) 20 MEQ tablet TAKE 1 TABLET EVERY DAY 02/13/13  Yes Ladene Artist, MD  PRESCRIPTION MEDICATION Inject as directed every 7 (seven) days. Velcade 3mg  injection q7d on Thursdays, skips every 4th week.   Yes Historical Provider, MD  prochlorperazine (COMPAZINE) 10 MG tablet Take 10 mg by mouth every 6 (six) hours as needed (for nausea).   Yes Historical Provider, MD  tamsulosin (FLOMAX) 0.4 MG CAPS Take 1 capsule (0.4 mg total) by mouth 2 (two) times daily with a meal. 12/18/12  Yes Ileana Ladd, MD  zolpidem (AMBIEN) 10 MG tablet Take 5 mg by mouth at bedtime as needed for sleep.   Yes Historical Provider, MD     ROS: As above in the HPI. All other systems are stable or negative.  OBJECTIVE: APPEARANCE:  Patient in no acute distress.The patient appeared well nourished and normally developed. Acyanotic. Waist: VITAL SIGNS:BP 128/77  Pulse 100  Temp(Src) 97.7 F (36.5 C) (Oral)  Wt 256 lb 12.8 oz (116.484 kg)  BMI 33.89 kg/m2  AAM Obese  Tilt positive  Bp 128/77lying Bp 100/70 sitting  SKIN: warm and  Dry without overt rashes, tattoos and scars  HEAD and Neck: without JVD, Head and scalp: normal Eyes:No scleral icterus. Fundi normal, eye movements normal. Ears: Auricle normal, canal normal, Tympanic membranes normal, insufflation normal. Nose: normal Throat: normal Neck & thyroid: normal  CHEST & LUNGS: Chest wall: normal Lungs: Clear  CVS: Reveals the PMI to be normally located. Regular rhythm, First and Second  Heart sounds are normal,  absence of murmurs, rubs or gallops. Peripheral vasculature: Radial pulses: normal Dorsal pedis pulses: normal Posterior pulses: normal  ABDOMEN:  Appearance: Obese Benign, no organomegaly, no masses, no Abdominal Aortic enlargement. No Guarding , no rebound. No Bruits. Bowel sounds: normal  RECTAL: N/A GU: N/A  EXTREMETIES: nonedematous. Both Femoral and Pedal pulses are normal.  MUSCULOSKELETAL:  Spine: normal Joints: intact Ambulates with a cane.  Results for orders placed in visit on 02/22/13  CBC WITH DIFFERENTIAL      Result Value Range   WBC 5.4  4.0 - 10.3 10e3/uL   NEUT# 3.6  1.5 - 6.5 10e3/uL   HGB 11.9 (*) 13.0 - 17.1 g/dL   HCT 16.1 (*) 09.6 - 04.5 %   Platelets 104 (*) 140 - 400 10e3/uL   MCV 86.6  79.3 - 98.0 fL   MCH 30.3  27.2 - 33.4 pg   MCHC 35.0  32.0 - 36.0 g/dL   RBC 4.09 (*) 8.11 - 9.14 10e6/uL   RDW 16.9 (*) 11.0 - 14.6 %   lymph# 0.8 (*) 0.9 - 3.3 10e3/uL   MONO# 0.8  0.1 - 0.9 10e3/uL   Eosinophils Absolute 0.1  0.0 - 0.5 10e3/uL   Basophils Absolute 0.0  0.0 - 0.1 10e3/uL   NEUT% 68.0  39.0 - 75.0 %   LYMPH% 14.9  14.0 - 49.0 %   MONO% 14.9 (*) 0.0 - 14.0 %   EOS% 1.6  0.0 - 7.0 %   BASO% 0.6  0.0 - 2.0 %    ASSESSMENT: Multiple myeloma  Essential hypertension, benign  Mixed hyperlipidemia  Obstructive sleep apnea  Constipation  Rectal bleeding  Internal and external bleeding hemorrhoids  PLAN:  Reduce the amlodipine to 1/2 tab daily. Monitor BP.  Wished him well on the colonoscopy  Remember to hold Aspirin 5 days at least prior to colonoscopy.  Return in  about 4 weeks (around 03/23/2013) for Recheck medical problems, recheck BP.  Candi Profit P. [place]esto Charon, M.D.

## 2013-02-23 NOTE — Telephone Encounter (Signed)
Per staff message and POF I have scheduled appts.  JMW  

## 2013-02-26 ENCOUNTER — Telehealth: Payer: Self-pay | Admitting: Oncology

## 2013-02-26 NOTE — Telephone Encounter (Signed)
Called pt, no answer , called wife and daughter no anwers

## 2013-02-28 ENCOUNTER — Telehealth: Payer: Self-pay | Admitting: Oncology

## 2013-02-28 NOTE — Telephone Encounter (Signed)
Talked to wife and gave her appt for August and September 2014, lab, ML and chemo

## 2013-03-01 ENCOUNTER — Ambulatory Visit: Payer: Self-pay | Admitting: Internal Medicine

## 2013-03-04 ENCOUNTER — Other Ambulatory Visit: Payer: Self-pay | Admitting: Oncology

## 2013-03-05 ENCOUNTER — Encounter: Payer: Self-pay | Admitting: Internal Medicine

## 2013-03-05 ENCOUNTER — Ambulatory Visit (INDEPENDENT_AMBULATORY_CARE_PROVIDER_SITE_OTHER): Payer: Medicare Other | Admitting: Internal Medicine

## 2013-03-05 ENCOUNTER — Encounter: Payer: Self-pay | Admitting: *Deleted

## 2013-03-05 VITALS — BP 110/66 | HR 91 | Ht 72.0 in | Wt 255.8 lb

## 2013-03-05 DIAGNOSIS — C9000 Multiple myeloma not having achieved remission: Secondary | ICD-10-CM

## 2013-03-05 DIAGNOSIS — G4733 Obstructive sleep apnea (adult) (pediatric): Secondary | ICD-10-CM

## 2013-03-05 MED ORDER — ZOLPIDEM TARTRATE 5 MG PO TABS: 5.0000 mg | ORAL_TABLET | Freq: Every evening | ORAL | Status: AC | PRN

## 2013-03-05 NOTE — Patient Instructions (Addendum)
Script for Ambien 5 mg for sleep if needed  Order- DME [facility]- Change CPAP to [employer] 7-12 cwp.  Please help educate humidifier, advise on length of hose, replacement supplies/ nasal pillows

## 2013-03-05 NOTE — Progress Notes (Signed)
Patient ID: [name], male    DOB: 1936/02/16, 77 y.o.   MRN: [id number]  HPI 03/05/11- 77 year old male former smoker seen because of obstructive sleep apnea on kind referral by Dr. Jacelyn Grip from [facility] in [place]. Wife(?) is here. A diagnostic sleep study on 01/31/2008 was done at [facility] because of insomnia with sleep apnea. This study confirmed moderately severe obstructive sleep apnea with an AHI of 22 per hour. A full face mask became uncomfortable, he got tired of it and stopped using CPAP several months to a year ago. Wife now reports loud snoring and again says he stays sleepy during the day although he has difficulty falling asleep. Bedtime between 8 and 9 PM with sleep latency at least to 30 minutes. He feels that he awakens and will lie awake or get up and wander around in the home  half of each night. He finally gets up around 5 AM. When he is asleep he feels that he is sleeping well but this is quite fragmented. He admits drowsiness during the day if he sits quietly. Occasional cough in the morning. Feels smothered lying supine, better on his sides. Treated for hypertension and elevated cholesterol but he denies heart or lung disease. Denies ENT surgery.  He had smoked heavily but quit 20 years ago.  04/23/11-  77 year old male former smoker seen because of obstructive sleep apnea. A male companion is with him. He has not been able to be compliant with CPAP, blaming pains in the back of his neck and spine related to wearing the CPAP. We discussed how it might be constraining motion until his muscles gets stiff. He wore it for one week out of the two-week auto titration trial. He has an older machine that worked well. He says his mask leak so that was unusable. His home care company would not replace it because he old money. He wants to skip the auto titration, go back to his old machine, change equipment supplier and get a new mask. We discussed CPAP, indications and alternatives,  medical concerns, and the documentation of adequate compliance to meet the Medicare rules. He isn't sure of the pressure setting on his old machine, which was one of the reasons we went for auto titration.  06/04/11- 77 year old male former smoker seen because of obstructive sleep apnea. Wife here. He got a replacement CPAP mask but is using his old machine. [facility] changed his pressure to 10. The current mask does not leak. He complains of insomnia with or without CPAP and that seems to be the biggest problem interfering with a sustained use of CPAP now.  07/30/11- 77 year old male former smoker seen because of obstructive sleep apnea. Wife here He try his old CPAP machine again but complains that humidifier gurgles in the flows even when he tries putting it lower than the level of his head so water runs back into the machine. He tried without she notify her but didn't like that either. Wife doesn't think CPAP stopped his snoring. He asks for a new sleep study which is the documentation we really need. He is sometimes restless at night even if he takes a sleeping pill.  09/02/19- 77 year old male former smoker seen because of obstructive sleep apnea.     NPSG 08/22/11- moderate OSA, AHI 26.8/hr with CPAP titration to 9 cwp for AHI 0.5/ hr. Oxygen was added at 2 L/M due to desaturation, and will need follow-up.  11/29/11- 77 year old male former smoker seen because of obstructive sleep apnea.  Pt hasnt worn mask fo 1 month. pt denies any sob,wheezing, chest congestion He was hospitalized twice for back surgery and I think complication of that surgery. Through that, he dropped off of CPAP. Wife says he still uses it a few hours at a time and it works when he wears it. Control has seemed  good at 9 CWP/ [facility]. Sleep is also disturbed by pain in his knees  02/29/12- 77 year old male former smoker seen because of obstructive sleep apnea.        Wife here  CPAP mask is not working well-unable to get one  until September due to insurance; Trying to wear CPAP every night  for 8-9 hours, but current mask hurts his face.  08/31/12- 77 year old male former smoker seen because of obstructive sleep apnea. FOLLOWS FOR: trying to wear CPAP every night but has hard time with mask around nose and pressure blowing into eyes; also states hard to lay/sleep on back. Leak worse in his preferred R decubitus sleep position. Wife says he snores through at times, but much better with CPAP. He is also concerned about dyspnea, especially with exertion. Dr Truett Perna recently suggested he might need to see a heart doctor. Denies chest pain, cough or wheeze. Apparently not a new problem.  03/05/13- 77 year old male former smoker seen because of obstructive sleep apnea. FOLLOWS FOR: hard to pull air in when putting machine on; then feels like too much air through the night; Wears CPAP 9/ [facility] every night. Would like refill of sleep medication as well.  Ambien has worked well with no problems. We discussed this again. CXR 3//3/14 IMPRESSION:  Negative chest.  Original Report Authenticated By: Holley Dexter, M.D.  Review of Systems-see HPI Constitutional:   No-   weight loss, night sweats, fevers, chills, +fatigue, lassitude HEENT:   No-  headaches, difficulty swallowing, tooth/dental problems, sore throat,       No-  sneezing, itching, ear ache, nasal congestion, post nasal drip,  CV:  No-   chest pain, orthopnea, PND, swelling in lower extremities, anasarca, dizziness, palpitations Resp: + shortness of breath with exertion or at rest.              No-   productive cough,  No non-productive cough,  No-  coughing up of blood.              No-   change in color of mucus.  No- wheezing.   Skin: No-   rash or lesions. GI:  No-   heartburn, indigestion, abdominal pain, nausea, vomiting,  GU: MS:  No-   joint pain or swelling.   Neuro- normal:  Psych:  No- change in mood or affect. No depression or anxiety.  No  memory loss.  Objective:   Physical Exam General- Alert, Oriented, Affect-appropriate, Distress- none acute,  Overweight, calm/laconic but seems intelligent. Yawning Using a walker. Skin- rash-none, lesions- none, excoriation- none Lymphadenopathy- none Head- atraumatic            Eyes- Gross vision intact, PERRLA, conjunctivae clear secretions, strabismus            Ears- Hearing, canals-normal            Nose- Clear, no-Septal dev, mucus, polyps, erosion, perforation             Throat- Mallampati II-III , mucosa clear , drainage- none, tonsils- atrophic Neck- flexible , trachea midline, no stridor , thyroid nl, carotid no bruit Chest - symmetrical excursion , unlabored  Heart/CV- RRR , no murmur , no gallop  , no rub, nl s1 s2                           +JVD 1-2 cm , edema- none, stasis changes- none, varices- none           Lung-  clear, wheeze- none, cough- none , dullness-none, rub- none           Chest wall-  Abd- Br/ Gen/ Rectal- Not done, not indicated Extrem- cyanosis- none, clubbing, none, atrophy- none, strength- nl Neuro- grossly intact to observation

## 2013-03-05 NOTE — Progress Notes (Signed)
RECEIVED A FAX FROM [facility] CONCERNING A CONFIRMATION OF PRESCRIPTION SHIPMENT FOR CYCLOPHOSPHAMIDE ON 03/01/13.

## 2013-03-06 ENCOUNTER — Encounter: Payer: Self-pay | Admitting: Internal Medicine

## 2013-03-06 ENCOUNTER — Ambulatory Visit (AMBULATORY_SURGERY_CENTER): Payer: Medicare Other | Admitting: Internal Medicine

## 2013-03-06 VITALS — BP 132/84 | HR 66 | Temp 98.1°F | Resp 18 | Ht 72.0 in | Wt 259.0 lb

## 2013-03-06 DIAGNOSIS — D126 Benign neoplasm of colon, unspecified: Secondary | ICD-10-CM

## 2013-03-06 DIAGNOSIS — K5730 Diverticulosis of large intestine without perforation or abscess without bleeding: Secondary | ICD-10-CM

## 2013-03-06 DIAGNOSIS — Z86010 Personal history of colonic polyps: Secondary | ICD-10-CM

## 2013-03-06 DIAGNOSIS — K644 Residual hemorrhoidal skin tags: Secondary | ICD-10-CM

## 2013-03-06 DIAGNOSIS — K648 Other hemorrhoids: Secondary | ICD-10-CM

## 2013-03-06 MED ORDER — ASPIRIN 325 MG PO TBEC: 325.0000 mg | DELAYED_RELEASE_TABLET | Freq: Two times a day (BID) | ORAL | Status: DC

## 2013-03-06 MED ORDER — SODIUM CHLORIDE 0.9 % IV SOLN: 500.0000 mL | INTRAVENOUS | Status: DC

## 2013-03-06 NOTE — Progress Notes (Signed)
Lidocaine-40mg IV prior to Propofol InductionPropofol given over incremental dosages 

## 2013-03-06 NOTE — Progress Notes (Signed)
Called to room to assist during endoscopic procedure.  Patient ID and intended procedure confirmed with present staff. Received instructions for my participation in the procedure from the performing physician.  

## 2013-03-06 NOTE — Progress Notes (Signed)
Patient did not experience any of the following events: a burn prior to discharge; a fall within the facility; wrong site/side/patient/procedure/implant event; or a hospital transfer or hospital admission upon discharge from the facility. (G8907) Patient did not have preoperative order for IV antibiotic SSI prophylaxis. (G8918)  

## 2013-03-06 NOTE — Op Note (Signed)
 [facility] [address] [place], [address]   COLONOSCOPY PROCEDURE REPORT  PATIENT: [name], [name]  MR#: [id number] BIRTHDATE: 1936/01/10 , 77  yrs. old GENDER: Male ENDOSCOPIST: Iva Boop, MD, [facility] PROCEDURE DATE:  03/06/2013 PROCEDURE:   Colonoscopy with snare polypectomy First Screening Colonoscopy - Avg.  risk and is 50 yrs.  old or older - No.  Prior Negative Screening - Now for repeat screening. N/A  History of Adenoma - Now for follow-up colonoscopy & has been > or = to 3 yrs.  Yes hx of adenoma.  Has been 3 or more years since last colonoscopy.  Polyps Removed Today? Yes. ASA CLASS:   Class III INDICATIONS:Patient's personal history of adenomatous colon polyps.  MEDICATIONS: propofol (Diprivan) 150mg  IV, MAC sedation, administered by CRNA, and These medications were titrated to patient response per physician's verbal order  DESCRIPTION OF PROCEDURE:   After the risks benefits and alternatives of the procedure were thoroughly explained, informed consent was obtained.  A digital rectal exam revealed no abnormalities of the rectum, A digital rectal exam revealed no prostatic nodules, A digital rectal exam revealed the prostate was not enlarged, and A digital rectal exam revealed several skin tags. The [id number]  endoscope was introduced through the anus and advanced to the cecum, which was identified by both the appendix and ileocecal valve. No adverse events experienced.   The quality of the prep was Suprep good  The instrument was then slowly withdrawn as the colon was fully examined.  COLON FINDINGS: A sessile polyp measuring 8 mm in size was found in the transverse colon.  A polypectomy was performed using snare cautery.  The resection was complete and the polyp tissue was completely retrieved.   Severe diverticulosis was noted throughout the entire examined colon.   Internal and external hemorrhoids were found.  Otherwise normal  colon. Retroflexed views revealed internal/external hemorrhoids. The time to cecum=1 minutes 15 seconds.  Withdrawal time=9 minutes 40 seconds.  The scope was withdrawn and the procedure completed. COMPLICATIONS: There were no complications.  ENDOSCOPIC IMPRESSION: 1.   Sessile polyp measuring 8 mm in size was found in the transverse colon; polypectomy was performed using snare cautery 2.   Severe diverticulosis was noted throughout the entire examined colon 3.   Internal and external hemorrhoids in rectum, otherwise normal colon with good prep.  RECOMMENDATIONS: Office will contact patient and arrange appointment for hemorrhoid banding, will see if we can do this week or next week. This is probably last routine colonoscopy, has hx diminutive adenoma 2009.   eSigned:  Iva Boop, MD, [facility] 03/06/2013 3:06 PM  cc: The Patient

## 2013-03-06 NOTE — Patient Instructions (Addendum)
I found and removed one polyp today. It looks benign. You also have diverticulosis and hemorrhoids, as you are aware.  I will have my office call you to set up treatment of your hemorrhoids, we will try to start this soon.  This may the last time you need a routine colonoscopy.  I appreciate the opportunity to care for you. Iva Boop, MD, FACG  YOU HAD AN ENDOSCOPIC PROCEDURE TODAY AT THE [facility]: Refer to the procedure report that was given to you for any specific questions about what was found during the examination.  If the procedure report does not answer your questions, please call your gastroenterologist to clarify.  If you requested that your care partner not be given the details of your procedure findings, then the procedure report has been included in a sealed envelope for you to review at your convenience later.  YOU SHOULD EXPECT: Some feelings of bloating in the abdomen. Passage of more gas than usual.  Walking can help get rid of the air that was put into your GI tract during the procedure and reduce the bloating. If you had a lower endoscopy (such as a colonoscopy or flexible sigmoidoscopy) you may notice spotting of blood in your stool or on the toilet paper. If you underwent a bowel prep for your procedure, then you may not have a normal bowel movement for a few days.  DIET: Your first meal following the procedure should be a light meal and then it is ok to progress to your normal diet.  A half-sandwich or bowl of soup is an example of a good first meal.  Heavy or fried foods are harder to digest and may make you feel nauseous or bloated.  Likewise meals heavy in dairy and vegetables can cause extra gas to form and this can also increase the bloating.  Drink plenty of fluids but you should avoid alcoholic beverages for 24 hours.  ACTIVITY: Your care partner should take you home directly after the procedure.  You should plan to take it easy, moving slowly for the  rest of the day.  You can resume normal activity the day after the procedure however you should NOT DRIVE or use heavy machinery for 24 hours (because of the sedation medicines used during the test).    SYMPTOMS TO REPORT IMMEDIATELY: A gastroenterologist can be reached at any hour.  During normal business hours, 8:30 AM to 5:00 PM Monday through Friday, call [phone].  After hours and on weekends, please call the GI answering service at [phone] who will take a message and have the physician on call contact you.   Following lower endoscopy (colonoscopy or flexible sigmoidoscopy):  Excessive amounts of blood in the stool  Significant tenderness or worsening of abdominal pains  Swelling of the abdomen that is new, acute  Fever of 100F or higher  Following upper endoscopy (EGD)  Vomiting of blood or coffee ground material  New chest pain or pain under the shoulder blades  Painful or persistently difficult swallowing  New shortness of breath  Fever of 100F or higher  Black, tarry-looking stools  FOLLOW UP: If any biopsies were taken you will be contacted by phone or by letter within the next 1-3 weeks.  Call your gastroenterologist if you have not heard about the biopsies in 3 weeks.  Our staff will call the home number listed on your records the next business day following your procedure to check on you and address any  questions or concerns that you may have at that time regarding the information given to you following your procedure. This is a courtesy call and so if there is no answer at the home number and we have not heard from you through the emergency physician on call, we will assume that you have returned to your regular daily activities without incident.  SIGNATURES/CONFIDENTIALITY: You and/or your care partner have signed paperwork which will be entered into your electronic medical record.  These signatures attest to the fact that that the information above on your  After Visit Summary has been reviewed and is understood.  Full responsibility of the confidentiality of this discharge information lies with you and/or your care-partner.   Polyp, diverticulosis, hemorrhoid information given.   Do not take any aspirin until September 9.

## 2013-03-06 NOTE — Progress Notes (Signed)
Patient noted to be short of breath on admission. Patient denies chest pain, skin warm dry and pink. Patient stating he has been short winded on exertion for a few weeks. Dr. Leone Payor informed of patient condition.  Dr. Leone Payor in and examined the patient, cleared for procedure.  Patient without visible signs of dyspnea at time of exam. Brennan Bailey CRNA , requesting patient have preprocedure 02. 02 at 2 l/min. sa02 at 95% on 02. Patient and patient's wife stating state that the patient had chicken noodle soup at 0800, informed Dr. Leone Payor and Brennan Bailey CRNA.

## 2013-03-07 ENCOUNTER — Telehealth: Payer: Self-pay

## 2013-03-07 ENCOUNTER — Telehealth: Payer: Self-pay | Admitting: Internal Medicine

## 2013-03-07 ENCOUNTER — Telehealth: Payer: Self-pay | Admitting: *Deleted

## 2013-03-07 NOTE — Telephone Encounter (Signed)
Unable to leave message on patients only #, voice mail not set up.  However I was able to leave a message on daughter Misty Stanley 's # [phone] that we wish to set him up an appointment for a banding consult 03/15/13 at 1:45pm with Dr. Leone Payor.  Hopefully they will call us back quickly before the spots get filled.

## 2013-03-07 NOTE — Telephone Encounter (Signed)
  Follow up Call-  Call back number 03/06/2013  Post procedure Call Back phone  # [phone]  Permission to leave phone message Yes     Patient questions:  Do you have a fever, pain , or abdominal swelling? no Pain Score  0 *  Have you tolerated food without any problems? yes  Have you been able to return to your normal activities? yes  Do you have any questions about your discharge instructions: Diet   no Medications  no Follow up visit  no  Do you have questions or concerns about your Care? no  Actions: * If pain score is 4 or above: No action needed, pain <4.

## 2013-03-07 NOTE — Telephone Encounter (Signed)
Message copied by [place], Nya Monds E on Wed Mar 07, 2013  2:23 PM ------      Message from: Iva Boop      Created: Tue Mar 06, 2013  3:06 PM      Regarding: hemorrhoid ligation       Let's call him tomorrow (after we talk)      ? Squeeze in banding this Fri or arrange next week - I can come over from [facility] ------

## 2013-03-08 ENCOUNTER — Other Ambulatory Visit (HOSPITAL_BASED_OUTPATIENT_CLINIC_OR_DEPARTMENT_OTHER): Payer: Medicare Other | Admitting: Lab

## 2013-03-08 ENCOUNTER — Ambulatory Visit (HOSPITAL_BASED_OUTPATIENT_CLINIC_OR_DEPARTMENT_OTHER): Payer: Medicare Other

## 2013-03-08 VITALS — BP 133/70 | HR 86 | Temp 98.1°F | Resp 18

## 2013-03-08 DIAGNOSIS — Z5112 Encounter for antineoplastic immunotherapy: Secondary | ICD-10-CM

## 2013-03-08 DIAGNOSIS — C9000 Multiple myeloma not having achieved remission: Secondary | ICD-10-CM

## 2013-03-08 LAB — CBC WITH DIFFERENTIAL/PLATELET
Basophils Absolute: 0 10*3/uL (ref 0.0–0.1)
Eosinophils Absolute: 0.1 10*3/uL (ref 0.0–0.5)
HGB: 11.6 g/dL — ABNORMAL LOW (ref 13.0–17.1)
MONO#: 0.6 10*3/uL (ref 0.1–0.9)
NEUT#: 2.8 10*3/uL (ref 1.5–6.5)
RDW: 16.2 % — ABNORMAL HIGH (ref 11.0–14.6)
WBC: 4.7 10*3/uL (ref 4.0–10.3)
lymph#: 1.2 10*3/uL (ref 0.9–3.3)

## 2013-03-08 MED ORDER — DEXAMETHASONE 4 MG PO TABS
40.0000 mg | ORAL_TABLET | Freq: Once | ORAL | Status: AC
  Administered 2013-03-08: 40 mg via ORAL

## 2013-03-08 MED ORDER — BORTEZOMIB CHEMO SQ INJECTION 3.5 MG (2.5MG/ML)
1.3000 mg/m2 | Freq: Once | INTRAMUSCULAR | Status: AC
  Administered 2013-03-08: 3 mg via SUBCUTANEOUS
  Filled 2013-03-08: qty 3

## 2013-03-08 MED ORDER — ONDANSETRON HCL 8 MG PO TABS
8.0000 mg | ORAL_TABLET | Freq: Once | ORAL | Status: AC
  Administered 2013-03-08: 8 mg via ORAL

## 2013-03-08 NOTE — Telephone Encounter (Signed)
Spoke to wife and informed her we had a cancellation tomorrow, she said they could come.

## 2013-03-08 NOTE — Patient Instructions (Signed)
[facility] Discharge Instructions for Patients Receiving Chemotherapy  Today you received the following chemotherapy agents: velcade  To help prevent nausea and vomiting after your treatment, we encourage you to take your nausea medication.  Take it as often as prescribed.     If you develop nausea and vomiting that is not controlled by your nausea medication, call the clinic. If it is after clinic hours your family physician or the after hours number for the clinic or go to the Emergency Department.   BELOW ARE SYMPTOMS THAT SHOULD BE REPORTED IMMEDIATELY:  *FEVER GREATER THAN 100.5 F  *CHILLS WITH OR WITHOUT FEVER  NAUSEA AND VOMITING THAT IS NOT CONTROLLED WITH YOUR NAUSEA MEDICATION  *UNUSUAL SHORTNESS OF BREATH  *UNUSUAL BRUISING OR BLEEDING  TENDERNESS IN MOUTH AND THROAT WITH OR WITHOUT PRESENCE OF ULCERS  *URINARY PROBLEMS  *BOWEL PROBLEMS  UNUSUAL RASH Items with * indicate a potential emergency and should be followed up as soon as possible.  Feel free to call the clinic you have any questions or concerns. The clinic phone number is [phone].   I have been informed and understand all the instructions given to me. I know to contact the clinic, my physician, or go to the Emergency Department if any problems should occur. I do not have any questions at this time, but understand that I may call the clinic during office hours   should I have any questions or need assistance in obtaining follow up care.    __________________________________________  _____________  __________ Signature of Patient or Authorized Representative            Date                   Time    __________________________________________ Nurse's Signature    

## 2013-03-09 ENCOUNTER — Ambulatory Visit (INDEPENDENT_AMBULATORY_CARE_PROVIDER_SITE_OTHER): Payer: Medicare Other | Admitting: Internal Medicine

## 2013-03-09 ENCOUNTER — Encounter: Payer: Self-pay | Admitting: Internal Medicine

## 2013-03-09 VITALS — BP 140/80 | HR 66 | Ht 72.0 in | Wt 256.8 lb

## 2013-03-09 DIAGNOSIS — K648 Other hemorrhoids: Secondary | ICD-10-CM

## 2013-03-09 NOTE — Patient Instructions (Addendum)
HEMORRHOID BANDING PROCEDURE    FOLLOW-UP CARE   1. The procedure you have had should have been relatively painless since the banding of the area involved does not have nerve endings and there is no pain sensation.  The rubber band cuts off the blood supply to the hemorrhoid and the band may fall off as soon as 48 hours after the banding (the band may occasionally be seen in the toilet bowl following a bowel movement). You may notice a temporary feeling of fullness in the rectum which should respond adequately to plain Tylenol or Motrin.  2. Following the banding, avoid strenuous exercise that evening and resume full activity the next day.  A sitz bath (soaking in a warm tub) or bidet is soothing, and can be useful for cleansing the area after bowel movements.     3. To avoid constipation, take two tablespoons of natural wheat bran, natural oat bran, flax, Benefiber or any over the counter fiber supplement and increase your water intake to 7-8 glasses daily.    4. Unless you have been prescribed anorectal medication, do not put anything inside your rectum for two weeks: No suppositories, enemas, fingers, etc.  5. Occasionally, you may have more bleeding than usual after the banding procedure.  This is often from the untreated hemorrhoids rather than the treated one.  Don't be concerned if there is a tablespoon or so of blood.  If there is more blood than this, lie flat with your bottom higher than your head and apply an ice pack to the area. If the bleeding does not stop within a half an hour or if you feel faint, call our office at [phone] or go to the emergency room.  6. Problems are not common; however, if there is a substantial amount of bleeding, severe pain, chills, fever or difficulty passing urine (very rare) or other problems, you should call us at [phone] or report to the nearest emergency room.  7. Do not stay seated continuously for more than 2-3 hours for a day or two  after the procedure.  Tighten your buttock muscles 10-15 times every two hours and take 10-15 deep breaths every 1-2 hours.  Do not spend more than a few minutes on the toilet if you cannot empty your bowel; instead re-visit the toilet at a later time.    Go back on your miralax daily.   I appreciate the opportunity to care for you.

## 2013-03-09 NOTE — Progress Notes (Signed)
Patient ID: [name], male   DOB: [birth year], 77 y.o.   MRN: [id number]   PROCEDURE NOTE: The patient presents with symptomatic grade  2 hemorrhoids, unresponsive to maximal medical therapy, requesting rubber band ligation of his/her hemorrhoidal disease.  All risks, benefits and alternative forms of therapy were described and informed consent was obtained.  In the Left Lateral Decubitus position (if anoscopy is performed) anoscopic examination revealed grade 2 hemorrhoids in  all psoitions.  The decision was made to band the RA internal hemorrhoid, and the [facility] O'Regan System was used to perform band ligation without complication.  Digital anorectal examination was then performed to assure proper positioning of the band, and to adjust the banded tissue as required.  The patient was discharged home without pain or other issues.  Dietary and behavioral recommendations were given and (if necessary - prescriptions were given), along with follow-up instructions.  The patient will return in  2 weeks for follow-up and possible additional banding as required. No complications were encountered and the patient tolerated the procedure well.

## 2013-03-14 ENCOUNTER — Encounter: Payer: Self-pay | Admitting: Internal Medicine

## 2013-03-14 NOTE — Progress Notes (Signed)
Quick Note:  Adenoma No routine repeat colonoscopy due to age ______

## 2013-03-15 ENCOUNTER — Ambulatory Visit (HOSPITAL_BASED_OUTPATIENT_CLINIC_OR_DEPARTMENT_OTHER): Payer: Medicare Other

## 2013-03-15 ENCOUNTER — Other Ambulatory Visit (HOSPITAL_BASED_OUTPATIENT_CLINIC_OR_DEPARTMENT_OTHER): Payer: Medicare Other | Admitting: Lab

## 2013-03-15 VITALS — BP 132/70 | HR 89 | Temp 97.5°F | Resp 22

## 2013-03-15 DIAGNOSIS — Z5112 Encounter for antineoplastic immunotherapy: Secondary | ICD-10-CM

## 2013-03-15 DIAGNOSIS — C9000 Multiple myeloma not having achieved remission: Secondary | ICD-10-CM

## 2013-03-15 LAB — CBC WITH DIFFERENTIAL/PLATELET
Basophils Absolute: 0 10*3/uL (ref 0.0–0.1)
Eosinophils Absolute: 0.1 10*3/uL (ref 0.0–0.5)
HGB: 12.1 g/dL — ABNORMAL LOW (ref 13.0–17.1)
LYMPH%: 34 % (ref 14.0–49.0)
MCV: 82.7 fL (ref 79.3–98.0)
MONO#: 0.5 10*3/uL (ref 0.1–0.9)
MONO%: 14.2 % — ABNORMAL HIGH (ref 0.0–14.0)
NEUT#: 1.9 10*3/uL (ref 1.5–6.5)
Platelets: 102 10*3/uL — ABNORMAL LOW (ref 140–400)

## 2013-03-15 MED ORDER — BORTEZOMIB CHEMO SQ INJECTION 3.5 MG (2.5MG/ML)
1.3000 mg/m2 | Freq: Once | INTRAMUSCULAR | Status: AC
  Administered 2013-03-15: 3 mg via SUBCUTANEOUS
  Filled 2013-03-15: qty 3

## 2013-03-15 MED ORDER — ONDANSETRON HCL 8 MG PO TABS
8.0000 mg | ORAL_TABLET | Freq: Once | ORAL | Status: AC
  Administered 2013-03-15: 8 mg via ORAL

## 2013-03-15 MED ORDER — DEXAMETHASONE 4 MG PO TABS
40.0000 mg | ORAL_TABLET | Freq: Once | ORAL | Status: AC
  Administered 2013-03-15: 40 mg via ORAL

## 2013-03-15 NOTE — Patient Instructions (Addendum)
[facility] Discharge Instructions for Patients Receiving Chemotherapy  Today you received the following chemotherapy agents VELCADE SQ  To help prevent nausea and vomiting after your treatment, we encourage you to take your nausea medication ABOUT 6 PM IF NEEDED   If you develop nausea and vomiting that is not controlled by your nausea medication, call the clinic.   BELOW ARE SYMPTOMS THAT SHOULD BE REPORTED IMMEDIATELY:  *FEVER GREATER THAN 100.5 F  *CHILLS WITH OR WITHOUT FEVER  NAUSEA AND VOMITING THAT IS NOT CONTROLLED WITH YOUR NAUSEA MEDICATION  *UNUSUAL SHORTNESS OF BREATH  *UNUSUAL BRUISING OR BLEEDING  TENDERNESS IN MOUTH AND THROAT WITH OR WITHOUT PRESENCE OF ULCERS  *URINARY PROBLEMS  *BOWEL PROBLEMS  UNUSUAL RASH Items with * indicate a potential emergency and should be followed up as soon as possible.  Feel free to call the clinic you have any questions or concerns. The clinic phone number is [phone].

## 2013-03-17 NOTE — Assessment & Plan Note (Signed)
Fixed pressure at 9 is not comfortable for him because it overdries and because the pressure starts to low. Plan-try changed to auto titration 7-12

## 2013-03-20 ENCOUNTER — Encounter: Payer: Self-pay | Admitting: Internal Medicine

## 2013-03-20 ENCOUNTER — Ambulatory Visit (INDEPENDENT_AMBULATORY_CARE_PROVIDER_SITE_OTHER): Payer: Medicare Other | Admitting: Internal Medicine

## 2013-03-20 VITALS — BP 110/70 | HR 80 | Ht 72.0 in | Wt 252.0 lb

## 2013-03-20 DIAGNOSIS — K644 Residual hemorrhoidal skin tags: Secondary | ICD-10-CM

## 2013-03-20 DIAGNOSIS — K648 Other hemorrhoids: Secondary | ICD-10-CM

## 2013-03-20 MED ORDER — AMBULATORY NON FORMULARY MEDICATION: Status: DC

## 2013-03-20 NOTE — Assessment & Plan Note (Signed)
RP pile ligated Added NTG 0.125% Rx for anal canal discomfort/ext hemorrhoids and also recticare RTC 2 weeks

## 2013-03-20 NOTE — Assessment & Plan Note (Signed)
Externals swollen and tender on exam, ? New fissure vs. Stenosis/spasm Add NTG and lidocaine topical Tx

## 2013-03-20 NOTE — Patient Instructions (Addendum)
You have a follow up appointment with Dr Leone Payor for banding #3 on 04/04/13 230 pm.  HEMORRHOID BANDING PROCEDURE    FOLLOW-UP CARE   1. The procedure you have had should have been relatively painless since the banding of the area involved does not have nerve endings and there is no pain sensation.  The rubber band cuts off the blood supply to the hemorrhoid and the band may fall off as soon as 48 hours after the banding (the band may occasionally be seen in the toilet bowl following a bowel movement). You may notice a temporary feeling of fullness in the rectum which should respond adequately to plain Tylenol or Motrin.  2. Following the banding, avoid strenuous exercise that evening and resume full activity the next day.  A sitz bath (soaking in a warm tub) or bidet is soothing, and can be useful for cleansing the area after bowel movements.     3. To avoid constipation, take two tablespoons of natural wheat bran, natural oat bran, flax, Benefiber or any over the counter fiber supplement and increase your water intake to 7-8 glasses daily.    4. Unless you have been prescribed anorectal medication, do not put anything inside your rectum for two weeks: No suppositories, enemas, fingers, etc.  5. Occasionally, you may have more bleeding than usual after the banding procedure.  This is often from the untreated hemorrhoids rather than the treated one.  Don't be concerned if there is a tablespoon or so of blood.  If there is more blood than this, lie flat with your bottom higher than your head and apply an ice pack to the area. If the bleeding does not stop within a half an hour or if you feel faint, call our office at [phone] or go to the emergency room.  6. Problems are not common; however, if there is a substantial amount of bleeding, severe pain, chills, fever or difficulty passing urine (very rare) or other problems, you should call us at [phone] or report to the nearest  emergency room.  7. Do not stay seated continuously for more than 2-3 hours for a day or two after the procedure.  Tighten your buttock muscles 10-15 times every two hours and take 10-15 deep breaths every 1-2 hours.  Do not spend more than a few minutes on the toilet if you cannot empty your bowel; instead re-visit the toilet at a later time.   Patient Drug Education for Nitroglycerin Ointment   A pea-sized drop should be placed on the tip of your finger and then gently placed inside the anus. The finger should be inserted 1/3 - 1/2 its length and may be covered with a plastic glove or finger cot. You may use Vaseline to help coat the finger or dilute the ointment.  The first few applications should be taken lying down, as mild light-headedness or a brief headache may occur.  The most common side effect - a headache. It is usually brief and mild, but may require Tylenol or Advil. You may dilute the NTG further with Vaseline to decrease the headaches. As the treatment progresses and the hemorrhoid begins to heal, the headaches will tend to dissipate. Other side effects include lightheadedness, flushing, dizziness, nervousness, nausea, and vomiting. If any of these side effects persist or worsen, notify us promptly. Stop using the NTG and notify us immediately if you develop the rare side effects of severe dizziness, fainting, fast/pounding heartbeat, paleness, sweating, blurred vision,  dry mouth, dark urine, bluish lips/skin/nails, unusual tiredness, severe weakness, irregular heartbeat, seizures, or chest pain. Serious allergic reactions are unusual, but seek immediate medical attention if you develop a rash, swelling, dizziness, or trouble breathing.  Tell us if you are allergic to nitrates, have severe anemia, low blood pressure, dehydration, chronic heart failure, cardiomyopathy, recent heart attack, increased pressure in the brain, or exposure to nitrates while on the job. Do not use NTG while  driving or working around machinery if you are drowsy, dizzy, have lightheadedness, or blurred vision. Limit alcoholic beverages. To minimize dizziness and lightheadedness, get up slowly when rising from a sitting or lying position. The elderly may be more prone to dizziness and falling. While there are not adequate studies to confirm the safety of NTG in pregnant or breast feeding women, it has been used without incident so far. We recommend waiting at least one hour after applying the NTG ointment before breast feeding.  Do not use NTG ointment if you are taking drugs for sexual problems [e.g., sildenafil (Viagra), tadalafil (Cialis), vardenafil (Levitra)]. Use caution before taking cough-and-cold products, diet aids, or NSAIDs preparations because they may contain ingredients that could increase your blood pressure, cause a fast heartbeat, or increase chest pain (e.g., pseudoephedrine, phenylephrine, chlorpheniramine, diphenhydramine, clemastine, ibuprofen, and naproxen). Tell us if you drink alcohol, take alteplase, migraine drugs (ergotamine), water pills/diuretics such as furosemide or hydrochlorothiazide, or other drugs for high blood pressure (beta blockers, calcium channel blockers, ACE inhibitors).  Store the NTG at room temperature and keep away from light and moisture. Close the container tightly after each use. Do not store in the bathroom. Keep away from children and pets. If you have any questions or problems please call us at  [phone].

## 2013-03-20 NOTE — Progress Notes (Signed)
Patient ID: [name], male   DOB: September 18, 1935, 77 y.o.   MRN: [id number]  The patient returns - he has not had bleeding since initial hemorrhoid banding about 2 weeks ago. Did have one large bowel movement but overall has not had straining or loose or hard stools.  PROCEDURE NOTE: The patient presents with symptomatic grade  2, hemorrhoids, unresponsive to maximal medical therapy, requesting rubber band ligation of his/her hemorrhoidal disease.  All risks, benefits and alternative forms of therapy were described and informed consent was obtained.  He did have some anal canal tenderness and thickened external hemorrhoids today, tender posterior. The decision was made to band the RP internal hemorrhoid, and the [facility] O'Regan System was used to perform band ligation without complication.  Digital anorectal examination was then performed to assure proper positioning of the band, and to adjust the banded tissue as required.  The patient was discharged home without pain or other issues.  Dietary and behavioral recommendations were given and NTG 0.125% and Recticare  prescriptions were given), along with follow-up instructions.  The patient will return in 2 weeks for follow-up and possible additional banding as required. No complications were encountered and the patient tolerated the procedure well.  ? New early fissure vs. Stenosis/spasm or just external hemorrhoid pain. Advised to keep stools soft.

## 2013-03-22 ENCOUNTER — Telehealth: Payer: Self-pay | Admitting: Oncology

## 2013-03-22 ENCOUNTER — Ambulatory Visit (HOSPITAL_BASED_OUTPATIENT_CLINIC_OR_DEPARTMENT_OTHER): Payer: Medicare Other | Admitting: Nurse Practitioner

## 2013-03-22 ENCOUNTER — Other Ambulatory Visit (HOSPITAL_BASED_OUTPATIENT_CLINIC_OR_DEPARTMENT_OTHER): Payer: Medicare Other | Admitting: Lab

## 2013-03-22 ENCOUNTER — Other Ambulatory Visit: Payer: Self-pay | Admitting: *Deleted

## 2013-03-22 ENCOUNTER — Ambulatory Visit (HOSPITAL_BASED_OUTPATIENT_CLINIC_OR_DEPARTMENT_OTHER): Payer: Medicare Other

## 2013-03-22 VITALS — BP 127/79 | HR 87 | Temp 97.4°F | Resp 20 | Ht 72.0 in | Wt 252.2 lb

## 2013-03-22 DIAGNOSIS — C9000 Multiple myeloma not having achieved remission: Secondary | ICD-10-CM

## 2013-03-22 DIAGNOSIS — R42 Dizziness and giddiness: Secondary | ICD-10-CM

## 2013-03-22 DIAGNOSIS — Z86718 Personal history of other venous thrombosis and embolism: Secondary | ICD-10-CM

## 2013-03-22 DIAGNOSIS — Z5112 Encounter for antineoplastic immunotherapy: Secondary | ICD-10-CM

## 2013-03-22 DIAGNOSIS — D630 Anemia in neoplastic disease: Secondary | ICD-10-CM

## 2013-03-22 DIAGNOSIS — K5900 Constipation, unspecified: Secondary | ICD-10-CM

## 2013-03-22 LAB — CBC WITH DIFFERENTIAL/PLATELET
EOS%: 2.2 % (ref 0.0–7.0)
MCH: 30 pg (ref 27.2–33.4)
MCV: 86.2 fL (ref 79.3–98.0)
MONO%: 18.9 % — ABNORMAL HIGH (ref 0.0–14.0)
NEUT#: 2.7 10*3/uL (ref 1.5–6.5)
RBC: 4.15 10*6/uL — ABNORMAL LOW (ref 4.20–5.82)
RDW: 16.6 % — ABNORMAL HIGH (ref 11.0–14.6)

## 2013-03-22 LAB — BASIC METABOLIC PANEL (CC13)
Potassium: 4.3 mEq/L (ref 3.5–5.1)
Sodium: 139 mEq/L (ref 136–145)

## 2013-03-22 LAB — IGG: IgG (Immunoglobin G), Serum: 1030 mg/dL (ref 650–1600)

## 2013-03-22 MED ORDER — ONDANSETRON HCL 8 MG PO TABS: 8.0000 mg | ORAL_TABLET | Freq: Once | ORAL | Status: DC

## 2013-03-22 MED ORDER — CYCLOPHOSPHAMIDE 50 MG PO TABS: 600.0000 mg | ORAL_TABLET | ORAL | Status: DC

## 2013-03-22 MED ORDER — DEXAMETHASONE 4 MG PO TABS
ORAL_TABLET | ORAL | Status: AC
  Administered 2013-03-22: 10 mg
  Filled 2013-03-22: qty 10

## 2013-03-22 MED ORDER — BORTEZOMIB CHEMO SQ INJECTION 3.5 MG (2.5MG/ML)
1.3000 mg/m2 | Freq: Once | INTRAMUSCULAR | Status: AC
  Administered 2013-03-22: 3 mg via SUBCUTANEOUS
  Filled 2013-03-22: qty 3

## 2013-03-22 MED ORDER — DEXAMETHASONE 4 MG PO TABS: ORAL_TABLET | ORAL | Status: DC

## 2013-03-22 MED ORDER — ONDANSETRON HCL 8 MG PO TABS
ORAL_TABLET | ORAL | Status: AC
  Administered 2013-03-22: 8 mg
  Filled 2013-03-22: qty 1

## 2013-03-22 MED ORDER — DEXAMETHASONE 4 MG PO TABS: 40.0000 mg | ORAL_TABLET | Freq: Once | ORAL | Status: DC

## 2013-03-22 NOTE — Progress Notes (Signed)
OFFICE PROGRESS NOTE  Interval history:  [name] is a 77 year old man with multiple myeloma on active treatment with Cytoxan/Velcade/Decadron. He is seen today prior to proceeding with treatment.  Most recent serum M spike on 02/15/2013 showed further improvement at 0.51; IgG was also further improved at 1040.  He denies nausea/vomiting. No mouth sores. No diarrhea. He has had no further bleeding from the hemorrhoids since the banding procedure. He has tingling involving the fingertips on the left hand and the majority of the right hand. He denies fever. He has a slight cough. Intermittent shortness of breath. No chest pain. He reports increased dizziness her for the past 2-3 months. When asked to describe the dizziness he says that he feels "sleepy and tired" like he is not getting enough rest. No diplopia. No unusual headaches. He has had some recent sinus congestion and wonders if this may be causing his symptoms.  He notes fullness over the lower abdomen.  Objective: Blood pressure 127/79, pulse 87, temperature 97.4 F (36.3 C), temperature source Oral, resp. rate 20, height 6' (1.829 m), weight 252 lb 3.2 oz (114.397 kg).  Pupils equal round and reactive to light. No thrush. Soft fullness bilateral axillary regions. No axillary adenopathy. Lungs clear. Regular cardiac rhythm. No murmur. Abdomen soft and nontender. No hepatomegaly. Question irregularity of the fat right low medial abdomen. No mass. Extremities without edema. Mild to moderate decrease in vibratory sense at the fingertips per tuning fork exam.  Lab Results: Lab Results  Component Value Date   WBC 4.6 03/22/2013   HGB 12.4* 03/22/2013   HCT 35.8* 03/22/2013   MCV 86.2 03/22/2013   PLT 118* 03/22/2013    Chemistry:    Chemistry      Component Value Date/Time   NA 139 03/22/2013 1051   NA 136 02/10/2013 1928   K 4.3 03/22/2013 1051   K 3.6 02/10/2013 1928   CL 100 02/10/2013 1928   CL 106 12/28/2012 1059   CO2 25 03/22/2013  1051   CO2 26 02/10/2013 1928   BUN 7.4 03/22/2013 1051   BUN 18 02/10/2013 1928   CREATININE 0.8 03/22/2013 1051   CREATININE 0.82 02/10/2013 1928   CREATININE 0.89 01/31/2013 1217      Component Value Date/Time   CALCIUM 8.7 03/22/2013 1051   CALCIUM 8.5 02/10/2013 1928   ALKPHOS 48 02/15/2013 1033   ALKPHOS 54 02/10/2013 1928   AST 17 02/15/2013 1033   AST 19 02/10/2013 1928   ALT 7 02/15/2013 1033   ALT 14 02/10/2013 1928   BILITOT 0.62 02/15/2013 1033   BILITOT 0.2* 02/10/2013 1928       Studies/Results: No results found.  Medications: I have reviewed the patient's current medications.  Assessment/Plan:  1. Multiple myeloma, status post treatment with melphalan/prednisone/thalidomide beginning in November 2009. The thalidomide was increased to 200 mg daily beginning 09/11/2008. The serum M spike was slightly improved on 03/18/2009 and slightly increased on 05/09/2009. He has been maintained off of specific therapy for multiple myeloma since September 2010. The serum M spike was increased on 10/20/2011 at 4.6. He began Revlimid on 11/11/2011. He takes weekly dexamethasone. He began cycle 2 of Revlimid on 12/09/2011. The serum M spike was improved on 12/31/2011. The serum M spike was slightly higher on 01/28/2012. He began cycle 4 on 02/03/2012. He developed recurrent hypercalcemia. Treatment was changed to Cytoxan/Velcade/Decadron beginning 03/02/2012. The serum M spike was lower on 02/25/2012. The serum M spike was stable on 06/09/2012. The serum  M spike improved on 08/03/2012 and stable on 08/31/2012. The IgG level was stable on 08/31/2012. The IgG level and serum M spike were improved on 10/05/2012. The IgG level and serum M spike further improved on 11/02/2012 and 11/30/2012. IgG and serum M spike were overall stable on 12/28/2012; improved 02/15/2013. 2. Chronic "dizziness." By his description of symptoms it is unclear if he is experiencing dizziness. He will followup with his primary  physician. 3. Chest x-ray 11/01/2008 with a patchy opacity at the right midlung suspicious for pneumonia. He was treated with Avelox. 4. Low back pain with a radicular component. An MRI on 10/07/2008 showed no involvement of the lumbosacral spine with myeloma. 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 5. Bilateral neck pain, status post a CT 05/17/2008 with findings of spinal stenosis and osteoarthritis. 6. 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 Red cell microcytosis, likely related to hemoglobin C trait. A ferritin level returned elevated and a stool Hemoccult was negative 01/23/2010. He underwent a colonoscopy in 2009. 8. Elevated beta-2 microglobulin level. 9. History of hypertension. 10. History of a herniated disk at L4-L5 on MRI an 01/11/2002. 11. Hyperlipidemia. 12. Gastroesophageal reflux disease. 13. History of atypical angina. 14. History of rectal bleeding-large external hemorrhoids noted 02/25/2012. The stool was Hemoccult positive. He has a history of colon polyps. He is followed by [facility]. He was diagnosed with hemorrhoids in September of 2013 while hospitalized. He underwent a band procedure 03/09/2013. 15. Superficial venous thrombosis of the greater saphenous vein on a Doppler 09/27/2008. Negative for deep vein thrombosis. Symptoms improved with aspirin. 16. Neck pain with numbness/tingling in the arms, hands, and low back with proximal right leg weakness. An MRI of the cervical spine 05/30/2009 showed multilevel spondylosis with mild cord edema at C4-C5 and C5-C6. He was noted to have an enhancing lesion of the cord at T3-T4 of unclear etiology. In the lumbar spine there was no change in the spondylosis at L3-L4 and L4-L5. There was no involvement of the cervical or lumbar spine with myeloma. He is status post C4-C5,  C5-C6, and C6-C7 anterior cervical diskectomy with fusion by Dr. Jordan Likes 08/01/2009. 17. History of mild thrombocytopenia. 18. Indeterminate-age deep vein thrombosis of the left lower extremity on a venous Doppler 08/15/2009. There were also findings consistent with superficial thrombosis involving the right lower extremity 08/15/2009. Coumadin was discontinued in October 2011. 19. Type 2 diabetes. 20. History of hematuria, followed at [facility]. Urinalysis was negative for blood on 07/23/2011. 21. Radicular back pain with MRI of the lumbar spine on 10/19/2011 showing nerve root impingement at L2-L3, L3-L4 and L4-L5 with the most severe at the right L3 nerve roots due to a large disc fragment. Associated right leg weakness. He underwent right L2-3 decompressive laminotomy with right L2 and L3 decompressive foraminotomy; right L2-3 microdiscectomy on 11/01/2011. 22. Constipation He continues a laxative regimen. 23. Hospitalization 11/08/2011 through 11/10/2011 with orthostatic hypotension/dizziness. He improved with intravenous hydration. He had mild hypotension when here on 11/18/2011. We recommended discontinuing Norvasc and Proscar. 24. Hypercalcemia status post pamidronate 11/03/2011. Recurrent hypercalcemia 02/16/2012 status post Zometa. The calcium has remained in normal range. 25. Fractured tooth with associated pain. Status post a tooth extraction by Dr. Kristin Bruins, he is to be scheduled for multiple tooth extractions in the near future. Zometa has been placed on hold. 26. Exertional dyspnea. Stable. He has been evaluated by  cardiology. 27. 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 Stable.  Disposition-Mr. Choe appears stable. The serum M spike and IgG continue to improve. We will followup on the values from today. Plan to continue Cytoxan/Velcade/Decadron.  He will followup with Dr. Modesto Charon regarding the "dizziness".  The fullness he notes at  the lower abdomen may be  related to chronic steroid use.  He will return for a followup visit in one month.  Plan reviewed with Dr. Truett Perna.  Lonna Cobb ANP/GNP-BC

## 2013-03-22 NOTE — Telephone Encounter (Signed)
gv pt appt schedule for September and October.  °

## 2013-03-23 ENCOUNTER — Encounter: Payer: Self-pay | Admitting: Family Medicine

## 2013-03-23 ENCOUNTER — Ambulatory Visit (INDEPENDENT_AMBULATORY_CARE_PROVIDER_SITE_OTHER): Payer: Medicare Other | Admitting: Family Medicine

## 2013-03-23 ENCOUNTER — Telehealth: Payer: Self-pay | Admitting: Family Medicine

## 2013-03-23 VITALS — BP 119/75 | HR 88 | Temp 97.8°F | Wt 251.6 lb

## 2013-03-23 DIAGNOSIS — J019 Acute sinusitis, unspecified: Secondary | ICD-10-CM

## 2013-03-23 DIAGNOSIS — I10 Essential (primary) hypertension: Secondary | ICD-10-CM

## 2013-03-23 DIAGNOSIS — J309 Allergic rhinitis, unspecified: Secondary | ICD-10-CM

## 2013-03-23 DIAGNOSIS — J302 Other seasonal allergic rhinitis: Secondary | ICD-10-CM | POA: Insufficient documentation

## 2013-03-23 DIAGNOSIS — C9000 Multiple myeloma not having achieved remission: Secondary | ICD-10-CM

## 2013-03-23 DIAGNOSIS — E782 Mixed hyperlipidemia: Secondary | ICD-10-CM

## 2013-03-23 DIAGNOSIS — J209 Acute bronchitis, unspecified: Secondary | ICD-10-CM

## 2013-03-23 MED ORDER — FLUTICASONE PROPIONATE 50 MCG/ACT NA SUSP: 2.0000 | Freq: Every day | NASAL | Status: DC

## 2013-03-23 MED ORDER — MOXIFLOXACIN HCL 400 MG PO TABS: 400.0000 mg | ORAL_TABLET | Freq: Every day | ORAL | Status: DC

## 2013-03-23 NOTE — Progress Notes (Signed)
Patient ID: [name], male   DOB: [birth year], 77 y.o.   MRN: [id number] SUBJECTIVE: CC: Chief Complaint  Patient presents with  . Follow-up    sinus sx's and states "get bronchitis easy"    HPI: Nasal congestion with allergies and pressure in the face and ears and congested cough. Tends to get bronchitis. Has been on chemotherapy for multiple myeloma.  Recheck rectal bleeding. Has had hemorrhoidal banding and the bleeding has ceased. However has to go for one more banding.  Patient is here for follow up of hypertension and other medical problems: denies Headache;deniesChest Pain;denies weakness;denies Shortness of Breath or Orthopnea;denies Visual changes;denies palpitations;denies cough;denies pedal edema;denies symptoms of TIA or stroke; admits to Compliance with medications. denies Problems with medications.  Past Medical History  Diagnosis Date  . GERD (gastroesophageal reflux disease)   . Essential hypertension, benign   . BPH (benign prostatic hyperplasia)   . Lumbar herniated disc     L4-5  . Hyperlipidemia   . Sleep apnea   . Multiple myeloma 02/19/2008  . Arthritis   . Diverticulosis   . Peptic ulcer disease   . Tubular adenoma 02/16/2008    Dr. Stan Head   Past Surgical History  Procedure Laterality Date  . Neck surgery    . Lumbar laminectomy/decompression microdiscectomy  11/01/2011    Procedure: LUMBAR LAMINECTOMY/DECOMPRESSION MICRODISCECTOMY 2 LEVELS;  Surgeon: Temple Pacini, MD;  Location: [facility];  Service: Neurosurgery;  Laterality: Right;  Lumbar Laminectomy/Microdiscectomy Decompression Lumbar Three-Four, Lumbar Four-Five   . Colonoscopy    . Hemorrhoid banding  2014   History   Social History  . Marital Status: Married    Spouse Name: N/A    Number of Children: 4  . Years of Education: N/A   Occupational History  . retired-disabled-Construction    Social History Main Topics  . Smoking status: Former Smoker -- 2.00 packs/day for 20 years     Types: Cigarettes    Quit date: 07/12/1990  . Smokeless tobacco: Never Used  . Alcohol Use: No  . Drug Use: No  . Sexual Activity: Not on file   Other Topics Concern  . Not on file   Social History Narrative   Pt was adopted.   Married   Probation officer   Family History  Problem Relation Age of Onset  . Adopted: Yes   Current Outpatient Prescriptions on File Prior to Visit  Medication Sig Dispense Refill  . acyclovir (ZOVIRAX) 400 MG tablet TAKE 1 TABLET (400 MG TOTAL) BY MOUTH 2 (TWO) TIMES DAILY.  60 tablet  2  . AMBULATORY NON FORMULARY MEDICATION Medication Name: Nitroglycerine ointment 0.125 %  Apply a pea sized amount internally two to three times a day Dispense 30 GM zero refill  1 Tube  1  . aspirin 325 MG EC tablet Take 1 tablet (325 mg total) by mouth 2 (two) times daily. Do not take again until March 20, 2013      . atorvastatin (LIPITOR) 20 MG tablet Take 1 tablet (20 mg total) by mouth daily.  30 tablet  3  . co-enzyme Q-10 50 MG capsule Take 4 capsules (200 mg total) by mouth daily.  120 capsule  11  . cyclophosphamide (CYTOXAN) 50 MG tablet Take 12 tablets (600 mg total) by mouth once a week. Give on an empty stomach 1 hour before or 2 hours after meals.  48 tablet  1  . dexamethasone (DECADRON) 4 MG tablet TAKE 5 TABS ON Thursday  YOU DON'T COME TO CANCER CENTER FOR TREATMENT  20 tablet  1  . NITROSTAT 0.4 MG SL tablet Place 0.4 mg under the tongue as needed.      Marland Kitchen omeprazole (PRILOSEC) 20 MG capsule Take 1 capsule (20 mg total) by mouth daily.  30 capsule  4  . polyethylene glycol (MIRALAX / GLYCOLAX) packet Take 17 g by mouth 2 (two) times daily.       . potassium chloride SA (KLOR-CON M20) 20 MEQ tablet TAKE 1 TABLET EVERY DAY  30 tablet  0  . PRESCRIPTION MEDICATION Inject as directed every 7 (seven) days. Velcade 3mg  injection q7d on Thursdays, skips every 4th week.      . prochlorperazine (COMPAZINE) 10 MG tablet Take 10 mg by mouth every 6 (six) hours  as needed (for nausea).      . tamsulosin (FLOMAX) 0.4 MG CAPS Take 1 capsule (0.4 mg total) by mouth 2 (two) times daily with a meal.  60 capsule  5  . zolpidem (AMBIEN) 5 MG tablet Take 1 tablet (5 mg total) by mouth at bedtime as needed for sleep.  30 tablet  5   No current facility-administered medications on file prior to visit.   Allergies  Allergen Reactions  . Penicillins Hives and Rash   Immunization History  Administered Date(s) Administered  . Influenza Split 04/23/2011, 04/11/2012  . Tdap 07/13/2007   Prior to Admission medications   Medication Sig Start Date End Date Taking? Authorizing Provider  acyclovir (ZOVIRAX) 400 MG tablet TAKE 1 TABLET (400 MG TOTAL) BY MOUTH 2 (TWO) TIMES DAILY. 02/13/13  Yes Ladene Artist, MD  AMBULATORY NON FORMULARY MEDICATION Medication Name: Nitroglycerine ointment 0.125 %  Apply a pea sized amount internally two to three times a day Dispense 30 GM zero refill 03/20/13  Yes Iva Boop, MD  aspirin 325 MG EC tablet Take 1 tablet (325 mg total) by mouth 2 (two) times daily. Do not take again until March 20, 2013 03/06/13  Yes Iva Boop, MD  atorvastatin (LIPITOR) 20 MG tablet Take 1 tablet (20 mg total) by mouth daily. 02/02/13  Yes Ileana Ladd, MD  co-enzyme Q-10 50 MG capsule Take 4 capsules (200 mg total) by mouth daily. 01/31/13  Yes Ileana Ladd, MD  cyclophosphamide (CYTOXAN) 50 MG tablet Take 12 tablets (600 mg total) by mouth once a week. Give on an empty stomach 1 hour before or 2 hours after meals. 03/22/13  Yes Ladene Artist, MD  dexamethasone (DECADRON) 4 MG tablet TAKE 5 TABS ON Thursday YOU DON'T COME TO CANCER CENTER FOR TREATMENT 03/22/13  Yes Rana Snare, NP  NITROSTAT 0.4 MG SL tablet Place 0.4 mg under the tongue as needed. 12/07/12  Yes Historical Provider, MD  omeprazole (PRILOSEC) 20 MG capsule Take 1 capsule (20 mg total) by mouth daily. 10/06/12  Yes Ileana Ladd, MD  polyethylene glycol (MIRALAX / GLYCOLAX)  packet Take 17 g by mouth 2 (two) times daily.    Yes Historical Provider, MD  potassium chloride SA (KLOR-CON M20) 20 MEQ tablet TAKE 1 TABLET EVERY DAY 02/13/13  Yes Ladene Artist, MD  PRESCRIPTION MEDICATION Inject as directed every 7 (seven) days. Velcade 3mg  injection q7d on Thursdays, skips every 4th week.   Yes Historical Provider, MD  prochlorperazine (COMPAZINE) 10 MG tablet Take 10 mg by mouth every 6 (six) hours as needed (for nausea).   Yes Historical Provider, MD  tamsulosin (FLOMAX) 0.4 MG  CAPS Take 1 capsule (0.4 mg total) by mouth 2 (two) times daily with a meal. 12/18/12  Yes Ileana Ladd, MD  zolpidem (AMBIEN) 5 MG tablet Take 1 tablet (5 mg total) by mouth at bedtime as needed for sleep. 03/05/13 04/04/13 Yes Clinton D Young, MD  fluticasone (FLONASE) 50 MCG/ACT nasal spray Place 2 sprays into the nose daily. 03/23/13   Ileana Ladd, MD  moxifloxacin (AVELOX) 400 MG tablet Take 1 tablet (400 mg total) by mouth daily. 03/23/13   Ileana Ladd, MD     ROS: As above in the HPI. All other systems are stable or negative.  OBJECTIVE: APPEARANCE:  Patient in no acute distress.The patient appeared well nourished and normally developed. Acyanotic. Waist: VITAL SIGNS:BP 119/75  Pulse 88  Temp(Src) 97.8 F (36.6 C) (Oral)  Wt 251 lb 9.6 oz (114.125 kg)  BMI 34.12 kg/m2 Obese AAM  SKIN: warm and  Dry without overt rashes, tattoos and scars  HEAD and Neck: without JVD, Head and scalp: normal Eyes:No scleral icterus. Fundi normal, eye movements normal. Ears: Auricle normal, canal normal, Tympanic membranes normal, insufflation normal. Nose: nasal congestion. Patient says he has pressure feelings in the paranasal sinuses. Throat: normal Neck & thyroid: normal  CHEST & LUNGS: Chest wall: normal Lungs: Coarse breath sounds.  CVS: Reveals the PMI to be normally located. Regular rhythm, First and Second Heart sounds are normal,  absence of murmurs, rubs or  gallops. Peripheral vasculature: Radial pulses: normal Dorsal pedis pulses: normal Posterior pulses: normal  ABDOMEN:  Appearance: obese Benign, no organomegaly, no masses, no Abdominal Aortic enlargement. No Guarding , no rebound. No Bruits. Bowel sounds: normal  RECTAL: N/A GU: N/A  EXTREMETIES: nonedematous.  NEUROLOGIC: oriented to time,place and person; nonfocal. Ambulates with a cane.   ASSESSMENT: Multiple myeloma  Essential hypertension, benign  Mixed hyperlipidemia  Seasonal allergic rhinitis - Plan: fluticasone (FLONASE) 50 MCG/ACT nasal spray  Sinusitis, acute - Plan: moxifloxacin (AVELOX) 400 MG tablet  Acute bronchitis - Plan: moxifloxacin (AVELOX) 400 MG tablet    PLAN: Meds ordered this encounter  Medications  . fluticasone (FLONASE) 50 MCG/ACT nasal spray    Sig: Place 2 sprays into the nose daily.    Dispense:  16 g    Refill:  6  . moxifloxacin (AVELOX) 400 MG tablet    Sig: Take 1 tablet (400 mg total) by mouth daily.    Dispense:  7 tablet    Refill:  0    Results for orders placed in visit on 03/22/13  CBC WITH DIFFERENTIAL      Result Value Range   WBC 4.6  4.0 - 10.3 10e3/uL   NEUT# 2.7  1.5 - 6.5 10e3/uL   HGB 12.4 (*) 13.0 - 17.1 g/dL   HCT 45.4 (*) 09.8 - 11.9 %   Platelets 118 (*) 140 - 400 10e3/uL   MCV 86.2  79.3 - 98.0 fL   MCH 30.0  27.2 - 33.4 pg   MCHC 34.7  32.0 - 36.0 g/dL   RBC 1.47 (*) 8.29 - 5.62 10e6/uL   RDW 16.6 (*) 11.0 - 14.6 %   lymph# 0.9  0.9 - 3.3 10e3/uL   MONO# 0.9  0.1 - 0.9 10e3/uL   Eosinophils Absolute 0.1  0.0 - 0.5 10e3/uL   Basophils Absolute 0.0  0.0 - 0.1 10e3/uL   NEUT% 59.1  39.0 - 75.0 %   LYMPH% 19.4  14.0 - 49.0 %   MONO% 18.9 (*)  0.0 - 14.0 %   EOS% 2.2  0.0 - 7.0 %   BASO% 0.4  0.0 - 2.0 %  BASIC METABOLIC PANEL (CC13)      Result Value Range   Sodium 139  136 - 145 mEq/L   Potassium 4.3  3.5 - 5.1 mEq/L   Chloride 110 (*) 98 - 109 mEq/L   CO2 25  22 - 29 mEq/L   Glucose 100   70 - 140 mg/dl   BUN 7.4  7.0 - 16.1 mg/dL   Creatinine 0.8  0.7 - 1.3 mg/dL   Calcium 8.7  8.4 - 09.6 mg/dL  IGG      Result Value Range   IgG (Immunoglobin G), Serum 1030  650 - 1600 mg/dL    Medications Discontinued During This Encounter  Medication Reason  . amLODipine (NORVASC) 5 MG tablet No longer needed (for PRN medications)    Return in about 2 weeks (around 04/06/2013) for recheck BP.off of amlodipine  Nyko Gell P. Modesto Charon, M.D.

## 2013-03-24 ENCOUNTER — Other Ambulatory Visit: Payer: Self-pay | Admitting: Family Medicine

## 2013-03-26 ENCOUNTER — Encounter: Payer: Self-pay | Admitting: *Deleted

## 2013-03-26 ENCOUNTER — Other Ambulatory Visit: Payer: Self-pay | Admitting: Family Medicine

## 2013-03-26 DIAGNOSIS — J329 Chronic sinusitis, unspecified: Secondary | ICD-10-CM

## 2013-03-26 LAB — PROTEIN ELECTROPHORESIS, SERUM
Albumin ELP: 56.6 % (ref 55.8–66.1)
Alpha-1-Globulin: 7.1 % — ABNORMAL HIGH (ref 2.9–4.9)
Alpha-2-Globulin: 12 % — ABNORMAL HIGH (ref 7.1–11.8)
Total Protein, Serum Electrophoresis: 6 g/dL (ref 6.0–8.3)

## 2013-03-26 MED ORDER — LEVOFLOXACIN 500 MG PO TABS: 500.0000 mg | ORAL_TABLET | Freq: Every day | ORAL | Status: DC

## 2013-03-26 NOTE — Telephone Encounter (Signed)
Prescription changed in EPIC. From avelox to levaquin

## 2013-03-27 ENCOUNTER — Encounter: Payer: Self-pay | Admitting: *Deleted

## 2013-04-03 ENCOUNTER — Encounter: Payer: Self-pay | Admitting: *Deleted

## 2013-04-03 NOTE — Progress Notes (Signed)
RECEIVED A FAX FROM [facility] CONCERNING A CONFIRMATION OF PRESCRIPTION SHIPMENT FOR CYCLOPHOSPHAMIDE ON 04/02/13.

## 2013-04-04 ENCOUNTER — Ambulatory Visit (INDEPENDENT_AMBULATORY_CARE_PROVIDER_SITE_OTHER): Payer: Medicare Other | Admitting: Internal Medicine

## 2013-04-04 ENCOUNTER — Encounter: Payer: Self-pay | Admitting: Internal Medicine

## 2013-04-04 VITALS — BP 122/80 | HR 88 | Ht 72.0 in | Wt 256.2 lb

## 2013-04-04 DIAGNOSIS — K648 Other hemorrhoids: Secondary | ICD-10-CM

## 2013-04-04 NOTE — Patient Instructions (Addendum)
Follow up with [place] an needed.  HEMORRHOID BANDING PROCEDURE    FOLLOW-UP CARE   1. The procedure you have had should have been relatively painless since the banding of the area involved does not have nerve endings and there is no pain sensation.  The rubber band cuts off the blood supply to the hemorrhoid and the band may fall off as soon as 48 hours after the banding (the band may occasionally be seen in the toilet bowl following a bowel movement). You may notice a temporary feeling of fullness in the rectum which should respond adequately to plain Tylenol or Motrin.  2. Following the banding, avoid strenuous exercise that evening and resume full activity the next day.  A sitz bath (soaking in a warm tub) or bidet is soothing, and can be useful for cleansing the area after bowel movements.     3. To avoid constipation, take two tablespoons of natural wheat bran, natural oat bran, flax, Benefiber or any over the counter fiber supplement and increase your water intake to 7-8 glasses daily.    4. Unless you have been prescribed anorectal medication, do not put anything inside your rectum for two weeks: No suppositories, enemas, fingers, etc.  5. Occasionally, you may have more bleeding than usual after the banding procedure.  This is often from the untreated hemorrhoids rather than the treated one.  Don't be concerned if there is a tablespoon or so of blood.  If there is more blood than this, lie flat with your bottom higher than your head and apply an ice pack to the area. If the bleeding does not stop within a half an hour or if you feel faint, call our office at [phone] or go to the emergency room.  6. Problems are not common; however, if there is a substantial amount of bleeding, severe pain, chills, fever or difficulty passing urine (very rare) or other problems, you should call us at [phone] or report to the nearest emergency room.  7. Do not stay seated continuously for more  than 2-3 hours for a day or two after the procedure.  Tighten your buttock muscles 10-15 times every two hours and take 10-15 deep breaths every 1-2 hours.  Do not spend more than a few minutes on the toilet if you cannot empty your bowel; instead re-visit the toilet at a later time.       I appreciate the opportunity to care for you.

## 2013-04-04 NOTE — Progress Notes (Signed)
Patient ID: [name], male   DOB: 1935-12-28, 77 y.o.   MRN: [id number]  The patient is here s/p RA and RP hemorrhoid banding. No bleeding or pain. Was to start NTG ointment after last visit but says pharmacy did not call him. I thought might have early fissure or stenosis/spasm. He says he has been ok.  PROCEDURE NOTE: The patient presents with symptomatic grade 2  hemorrhoids, requesting rubber band ligation of his/her hemorrhoidal disease.  All risks, benefits and alternative forms of therapy were described and informed consent was obtained.  Rectal exam showed tags but no stenosis or tenderness as he had 9/9. Much more relaxed.  The decision was made to band the  LL internal hemorrhoid, and the [facility] was used to perform band ligation without complication.  Digital anorectal examination was then performed to assure proper positioning of the band, and to adjust the banded tissue as required.  The patient was discharged home without pain or other issues.   The patient will return as needed for follow-up and possible additional banding as required. No complications were encountered and the patient tolerated the procedure well.

## 2013-04-04 NOTE — Assessment & Plan Note (Addendum)
LL pile ligated today w/o difficulty

## 2013-04-05 ENCOUNTER — Other Ambulatory Visit (HOSPITAL_BASED_OUTPATIENT_CLINIC_OR_DEPARTMENT_OTHER): Payer: Medicare Other | Admitting: Lab

## 2013-04-05 ENCOUNTER — Ambulatory Visit (HOSPITAL_BASED_OUTPATIENT_CLINIC_OR_DEPARTMENT_OTHER): Payer: Medicare Other

## 2013-04-05 ENCOUNTER — Other Ambulatory Visit: Payer: Self-pay | Admitting: Oncology

## 2013-04-05 VITALS — BP 142/76 | HR 80 | Temp 97.9°F | Resp 18

## 2013-04-05 DIAGNOSIS — Z5112 Encounter for antineoplastic immunotherapy: Secondary | ICD-10-CM

## 2013-04-05 DIAGNOSIS — C9000 Multiple myeloma not having achieved remission: Secondary | ICD-10-CM

## 2013-04-05 LAB — CBC WITH DIFFERENTIAL/PLATELET
BASO%: 0.6 % (ref 0.0–2.0)
Basophils Absolute: 0 10*3/uL (ref 0.0–0.1)
EOS%: 2.1 % (ref 0.0–7.0)
HGB: 12.6 g/dL — ABNORMAL LOW (ref 13.0–17.1)
MCH: 30.1 pg (ref 27.2–33.4)
MCHC: 34.6 g/dL (ref 32.0–36.0)
MCV: 87.1 fL (ref 79.3–98.0)
MONO%: 15.1 % — ABNORMAL HIGH (ref 0.0–14.0)
RBC: 4.18 10*6/uL — ABNORMAL LOW (ref 4.20–5.82)
RDW: 16.9 % — ABNORMAL HIGH (ref 11.0–14.6)
lymph#: 1.1 10*3/uL (ref 0.9–3.3)

## 2013-04-05 MED ORDER — DEXAMETHASONE 4 MG PO TABS
ORAL_TABLET | ORAL | Status: AC
  Filled 2013-04-05: qty 10

## 2013-04-05 MED ORDER — ONDANSETRON HCL 8 MG PO TABS
8.0000 mg | ORAL_TABLET | Freq: Once | ORAL | Status: AC
  Administered 2013-04-05: 8 mg via ORAL

## 2013-04-05 MED ORDER — ONDANSETRON HCL 8 MG PO TABS
ORAL_TABLET | ORAL | Status: AC
  Filled 2013-04-05: qty 1

## 2013-04-05 MED ORDER — DEXAMETHASONE 4 MG PO TABS
40.0000 mg | ORAL_TABLET | Freq: Once | ORAL | Status: AC
  Administered 2013-04-05: 40 mg via ORAL

## 2013-04-05 MED ORDER — BORTEZOMIB CHEMO SQ INJECTION 3.5 MG (2.5MG/ML)
1.3000 mg/m2 | Freq: Once | INTRAMUSCULAR | Status: AC
  Administered 2013-04-05: 3 mg via SUBCUTANEOUS
  Filled 2013-04-05: qty 3

## 2013-04-05 NOTE — Patient Instructions (Signed)
[facility] Discharge Instructions for Patients Receiving Chemotherapy  Today you received the following chemotherapy agents: Velcade.  To help prevent nausea and vomiting after your treatment, we encourage you to take your nausea medication as prescribed.   If you develop nausea and vomiting that is not controlled by your nausea medication, call the clinic.   BELOW ARE SYMPTOMS THAT SHOULD BE REPORTED IMMEDIATELY:  *FEVER GREATER THAN 100.5 F  *CHILLS WITH OR WITHOUT FEVER  NAUSEA AND VOMITING THAT IS NOT CONTROLLED WITH YOUR NAUSEA MEDICATION  *UNUSUAL SHORTNESS OF BREATH  *UNUSUAL BRUISING OR BLEEDING  TENDERNESS IN MOUTH AND THROAT WITH OR WITHOUT PRESENCE OF ULCERS  *URINARY PROBLEMS  *BOWEL PROBLEMS  UNUSUAL RASH Items with * indicate a potential emergency and should be followed up as soon as possible.  Feel free to call the clinic you have any questions or concerns. The clinic phone number is [phone].    

## 2013-04-06 ENCOUNTER — Ambulatory Visit: Payer: Medicare Other | Admitting: Family Medicine

## 2013-04-09 ENCOUNTER — Encounter: Payer: Self-pay | Admitting: Family Medicine

## 2013-04-09 ENCOUNTER — Ambulatory Visit (INDEPENDENT_AMBULATORY_CARE_PROVIDER_SITE_OTHER): Payer: Medicare Other | Admitting: Family Medicine

## 2013-04-09 ENCOUNTER — Ambulatory Visit (INDEPENDENT_AMBULATORY_CARE_PROVIDER_SITE_OTHER): Payer: Medicare Other

## 2013-04-09 VITALS — BP 125/76 | HR 94 | Temp 97.8°F | Wt 259.0 lb

## 2013-04-09 DIAGNOSIS — N289 Disorder of kidney and ureter, unspecified: Secondary | ICD-10-CM

## 2013-04-09 DIAGNOSIS — R0609 Other forms of dyspnea: Secondary | ICD-10-CM

## 2013-04-09 DIAGNOSIS — E782 Mixed hyperlipidemia: Secondary | ICD-10-CM

## 2013-04-09 DIAGNOSIS — R0600 Dyspnea, unspecified: Secondary | ICD-10-CM

## 2013-04-09 DIAGNOSIS — M5116 Intervertebral disc disorders with radiculopathy, lumbar region: Secondary | ICD-10-CM

## 2013-04-09 DIAGNOSIS — G4700 Insomnia, unspecified: Secondary | ICD-10-CM

## 2013-04-09 DIAGNOSIS — Z860100 Personal history of colon polyps, unspecified: Secondary | ICD-10-CM

## 2013-04-09 DIAGNOSIS — Z23 Encounter for immunization: Secondary | ICD-10-CM

## 2013-04-09 DIAGNOSIS — G4733 Obstructive sleep apnea (adult) (pediatric): Secondary | ICD-10-CM

## 2013-04-09 DIAGNOSIS — M5126 Other intervertebral disc displacement, lumbar region: Secondary | ICD-10-CM

## 2013-04-09 DIAGNOSIS — C9000 Multiple myeloma not having achieved remission: Secondary | ICD-10-CM

## 2013-04-09 DIAGNOSIS — R5381 Other malaise: Secondary | ICD-10-CM

## 2013-04-09 LAB — POCT CBC
Granulocyte percent: 59.5 %G (ref 37–80)
HCT, POC: 40.2 % — AB (ref 43.5–53.7)
Hemoglobin: 12.7 g/dL — AB (ref 14.1–18.1)
Lymph, poc: 1.5 (ref 0.6–3.4)
MCH, POC: 28 pg (ref 27–31.2)
MCHC: 31.6 g/dL — AB (ref 31.8–35.4)
MCV: 88.5 fL (ref 80–97)
MPV: 9 fL (ref 0–99.8)
POC Granulocyte: 2.65 (ref 2–6.9)
POC LYMPH PERCENT: 34.8 %L (ref 10–50)
Platelet Count, POC: 151 10*3/uL (ref 142–424)
RBC: 4.5 M/uL — AB (ref 4.69–6.13)
RDW, POC: 16.6 %
WBC: 4.4 10*3/uL — AB (ref 4.6–10.2)

## 2013-04-09 NOTE — Progress Notes (Signed)
Patient ID: [name], male   DOB: [birth year], 77 y.o.   MRN: [id number] SUBJECTIVE: CC: Chief Complaint  Patient presents with  . Follow-up    c/o cold sx's would like flu shot today if can have    HPI:  Easily fatigued and tired just walking to his mailbox. Used to have some energy even though he was on chem therapy for Multiple myeloma. No chest pain. No pedal edema. Feels like he cannot get enough breath to do effort demanding chores.   Past Medical History  Diagnosis Date  . GERD (gastroesophageal reflux disease)   . Essential hypertension, benign   . BPH (benign prostatic hyperplasia)   . Lumbar herniated disc     L4-5  . Hyperlipidemia   . Sleep apnea   . Multiple myeloma 02/19/2008  . Arthritis   . Diverticulosis   . Peptic ulcer disease   . Tubular adenoma 02/16/2008    Dr. Stan Head   Past Surgical History  Procedure Laterality Date  . Neck surgery    . Lumbar laminectomy/decompression microdiscectomy  11/01/2011    Procedure: LUMBAR LAMINECTOMY/DECOMPRESSION MICRODISCECTOMY 2 LEVELS;  Surgeon: Temple Pacini, MD;  Location: [facility];  Service: Neurosurgery;  Laterality: Right;  Lumbar Laminectomy/Microdiscectomy Decompression Lumbar Three-Four, Lumbar Four-Five   . Colonoscopy    . Hemorrhoid banding  2014   History   Social History  . Marital Status: Married    Spouse Name: N/A    Number of Children: 4  . Years of Education: N/A   Occupational History  . retired-disabled-Construction    Social History Main Topics  . Smoking status: Former Smoker -- 2.00 packs/day for 20 years    Types: Cigarettes    Quit date: 07/12/1990  . Smokeless tobacco: Never Used  . Alcohol Use: No  . Drug Use: No  . Sexual Activity: Not on file   Other Topics Concern  . Not on file   Social History Narrative   Pt was adopted.   Married   Probation officer   Family History  Problem Relation Age of Onset  . Adopted: Yes   Current Outpatient Prescriptions on File  Prior to Visit  Medication Sig Dispense Refill  . acyclovir (ZOVIRAX) 400 MG tablet TAKE 1 TABLET (400 MG TOTAL) BY MOUTH 2 (TWO) TIMES DAILY.  60 tablet  2  . aspirin 325 MG EC tablet Take 1 tablet (325 mg total) by mouth 2 (two) times daily. Do not take again until March 20, 2013      . atorvastatin (LIPITOR) 20 MG tablet Take 1 tablet (20 mg total) by mouth daily.  30 tablet  3  . co-enzyme Q-10 50 MG capsule Take 4 capsules (200 mg total) by mouth daily.  120 capsule  11  . cyclophosphamide (CYTOXAN) 50 MG tablet Take 12 tablets (600 mg total) by mouth once a week. Give on an empty stomach 1 hour before or 2 hours after meals.  48 tablet  1  . dexamethasone (DECADRON) 4 MG tablet TAKE 5 TABS ON Thursday YOU DON'T COME TO CANCER CENTER FOR TREATMENT  20 tablet  1  . fluticasone (FLONASE) 50 MCG/ACT nasal spray Place 2 sprays into the nose daily.  16 g  6  . levofloxacin (LEVAQUIN) 500 MG tablet Take 1 tablet (500 mg total) by mouth daily.  7 tablet  0  . NITROSTAT 0.4 MG SL tablet Place 0.4 mg under the tongue as needed.      Marland Kitchen  omeprazole (PRILOSEC) 20 MG capsule TAKE 1 CAPSULE (20 MG TOTAL) BY MOUTH DAILY.  30 capsule  5  . polyethylene glycol (MIRALAX / GLYCOLAX) packet Take 17 g by mouth 2 (two) times daily.       . potassium chloride SA (KLOR-CON M20) 20 MEQ tablet TAKE 1 TABLET EVERY DAY  30 tablet  0  . PRESCRIPTION MEDICATION Inject as directed every 7 (seven) days. Velcade 3mg  injection q7d on Thursdays, skips every 4th week.      . prochlorperazine (COMPAZINE) 10 MG tablet Take 10 mg by mouth every 6 (six) hours as needed (for nausea).      . tamsulosin (FLOMAX) 0.4 MG CAPS Take 1 capsule (0.4 mg total) by mouth 2 (two) times daily with a meal.  60 capsule  5   No current facility-administered medications on file prior to visit.   Allergies  Allergen Reactions  . Penicillins Hives and Rash   Immunization History  Administered Date(s) Administered  . Influenza Split  04/23/2011, 04/11/2012  . Tdap 07/13/2007   Prior to Admission medications   Medication Sig Start Date End Date Taking? Authorizing Provider  acyclovir (ZOVIRAX) 400 MG tablet TAKE 1 TABLET (400 MG TOTAL) BY MOUTH 2 (TWO) TIMES DAILY. 02/13/13   Ladene Artist, MD  aspirin 325 MG EC tablet Take 1 tablet (325 mg total) by mouth 2 (two) times daily. Do not take again until March 20, 2013 03/06/13   Iva Boop, MD  atorvastatin (LIPITOR) 20 MG tablet Take 1 tablet (20 mg total) by mouth daily. 02/02/13   Ileana Ladd, MD  co-enzyme Q-10 50 MG capsule Take 4 capsules (200 mg total) by mouth daily. 01/31/13   Ileana Ladd, MD  cyclophosphamide (CYTOXAN) 50 MG tablet Take 12 tablets (600 mg total) by mouth once a week. Give on an empty stomach 1 hour before or 2 hours after meals. 03/22/13   Ladene Artist, MD  dexamethasone (DECADRON) 4 MG tablet TAKE 5 TABS ON Thursday YOU DON'T COME TO CANCER CENTER FOR TREATMENT 03/22/13   Rana Snare, NP  fluticasone [facility]) 50 MCG/ACT nasal spray Place 2 sprays into the nose daily. 03/23/13   Ileana Ladd, MD  levofloxacin (LEVAQUIN) 500 MG tablet Take 1 tablet (500 mg total) by mouth daily. 03/26/13   Ileana Ladd, MD  NITROSTAT 0.4 MG SL tablet Place 0.4 mg under the tongue as needed. 12/07/12   Historical Provider, MD  omeprazole (PRILOSEC) 20 MG capsule TAKE 1 CAPSULE (20 MG TOTAL) BY MOUTH DAILY. 03/24/13   Ileana Ladd, MD  polyethylene glycol (MIRALAX / Ethelene Hal) packet Take 17 g by mouth 2 (two) times daily.     Historical Provider, MD  potassium chloride SA (KLOR-CON M20) 20 MEQ tablet TAKE 1 TABLET EVERY DAY 02/13/13   Ladene Artist, MD  PRESCRIPTION MEDICATION Inject as directed every 7 (seven) days. Velcade 3mg  injection q7d on Thursdays, skips every 4th week.    Historical Provider, MD  prochlorperazine (COMPAZINE) 10 MG tablet Take 10 mg by mouth every 6 (six) hours as needed (for nausea).    Historical Provider, MD  tamsulosin (FLOMAX)  0.4 MG CAPS Take 1 capsule (0.4 mg total) by mouth 2 (two) times daily with a meal. 12/18/12   Ileana Ladd, MD  zolpidem (AMBIEN) 5 MG tablet  04/05/13   Historical Provider, MD     ROS: As above in the HPI. All other systems are stable or negative.  OBJECTIVE: APPEARANCE:  Patient in no acute distress.The patient appeared well nourished and normally developed. Acyanotic. Waist: VITAL SIGNS:BP 125/76  Pulse 94  Temp(Src) 97.8 F (36.6 C) (Oral)  Wt 259 lb (117.482 kg)  BMI 35.12 kg/m2 Obese AAM  SKIN: warm and  Dry without overt rashes, tattoos and scars  HEAD and Neck: without JVD, Head and scalp: normal Eyes:No scleral icterus. Fundi normal, eye movements normal. Ears: Auricle normal, canal normal, Tympanic membranes normal, insufflation normal. Nose: normal Throat: normal Neck & thyroid: normal  CHEST & LUNGS: Chest wall: normal Lungs: Clear  CVS: Reveals the PMI to be normally located. Regular rhythm, First and Second Heart sounds are normal,  absence of murmurs, rubs or gallops. Peripheral vasculature: Radial pulses: normal Dorsal pedis pulses: normal Posterior pulses: normal  ABDOMEN:  Appearance: Obese Benign, no organomegaly, no masses, no Abdominal Aortic enlargement. No Guarding , no rebound. No Bruits. Bowel sounds: normal  RECTAL: N/A GU: N/A  EXTREMETIES:  Trace edematous.   NEUROLOGIC: oriented to time,place and person; nonfocal. Results for orders placed in visit on 04/09/13  POCT CBC      Result Value Range   WBC 4.4 (*) 4.6 - 10.2 K/uL   Lymph, poc 1.5  0.6 - 3.4   POC LYMPH PERCENT 34.8  10 - 50 %L   MID (cbc)    0 - 0.9   POC MID %    0 - 12 %M   POC Granulocyte 2.65  2 - 6.9   Granulocyte percent 59.5  37 - 80 %G   RBC 4.5 (*) 4.69 - 6.13 M/uL   Hemoglobin 12.7 (*) 14.1 - 18.1 g/dL   HCT, POC 16.1 (*) 09.6 - 53.7 %   MCV 88.5  80 - 97 fL   MCH, POC 28.0  27 - 31.2 pg   MCHC 31.6 (*) 31.8 - 35.4 g/dL   RDW, POC 04.5      Platelet Count, POC 151.0  142 - 424 K/uL   MPV 9.0  0 - 99.8 fL    ASSESSMENT: Dyspnea on effort - Plan: POCT CBC, DG Chest 2 View, EKG 12-Lead  Multiple myeloma  Lumbar disc herniation with radiculopathy  Mixed hyperlipidemia  Obstructive sleep apnea  Renal insufficiency  Dyspnea and respiratory abnormality  Insomnia with sleep apnea  Other malaise and fatigue  Personal history of colonic adenoma   PLAN: Orders Placed This Encounter  Procedures  . DG Chest 2 View    Standing Status: Future     Number of Occurrences: 1     Standing Expiration Date: 06/09/2014    Order Specific Question:  Reason for Exam (SYMPTOM  OR DIAGNOSIS REQUIRED)    Answer:  dyspnea    Order Specific Question:  Preferred imaging location?    Answer:  Internal  . Ambulatory referral to Cardiology    Referral Priority:  Routine    Referral Type:  Consultation    Referral Reason:  Specialty Services Required    Requested Specialty:  Cardiology    Number of Visits Requested:  1  . POCT CBC  . EKG 12-Lead    [facility] reading (PRIMARY) by  Dr.Gotham Raden: no acute findings to account for dyspnea  EKG: no acute findings.  Work up so far unrevealing for dyspnea. Will consult Cardiology next. Okay to get flu shot today.  Return in about 2 months (around 06/09/2013) for routine follow up, Recheck medical problems.  Aralynn Brake P. [place] Charon, M.D.

## 2013-04-11 ENCOUNTER — Other Ambulatory Visit: Payer: Self-pay | Admitting: Oncology

## 2013-04-12 ENCOUNTER — Other Ambulatory Visit (HOSPITAL_BASED_OUTPATIENT_CLINIC_OR_DEPARTMENT_OTHER): Payer: Medicare Other | Admitting: Lab

## 2013-04-12 ENCOUNTER — Ambulatory Visit (HOSPITAL_BASED_OUTPATIENT_CLINIC_OR_DEPARTMENT_OTHER): Payer: Medicare Other

## 2013-04-12 DIAGNOSIS — Z5112 Encounter for antineoplastic immunotherapy: Secondary | ICD-10-CM

## 2013-04-12 DIAGNOSIS — C9000 Multiple myeloma not having achieved remission: Secondary | ICD-10-CM

## 2013-04-12 LAB — CBC WITH DIFFERENTIAL/PLATELET
EOS%: 1.8 % (ref 0.0–7.0)
HCT: 34.1 % — ABNORMAL LOW (ref 38.4–49.9)
HGB: 12.4 g/dL — ABNORMAL LOW (ref 13.0–17.1)
LYMPH%: 23.1 % (ref 14.0–49.0)
MCH: 29.5 pg (ref 27.2–33.4)
MCHC: 36.4 g/dL — ABNORMAL HIGH (ref 32.0–36.0)
MCV: 81 fL (ref 79.3–98.0)
MONO%: 24 % — ABNORMAL HIGH (ref 0.0–14.0)
RBC: 4.21 10*6/uL (ref 4.20–5.82)
RDW: 16.1 % — ABNORMAL HIGH (ref 11.0–14.6)
nRBC: 0 % (ref 0–0)

## 2013-04-12 MED ORDER — ONDANSETRON HCL 8 MG PO TABS
8.0000 mg | ORAL_TABLET | Freq: Once | ORAL | Status: AC
  Administered 2013-04-12: 8 mg via ORAL

## 2013-04-12 MED ORDER — ONDANSETRON HCL 8 MG PO TABS
ORAL_TABLET | ORAL | Status: AC
  Filled 2013-04-12: qty 1

## 2013-04-12 MED ORDER — DEXAMETHASONE 4 MG PO TABS
ORAL_TABLET | ORAL | Status: AC
  Filled 2013-04-12: qty 10

## 2013-04-12 MED ORDER — BORTEZOMIB CHEMO SQ INJECTION 3.5 MG (2.5MG/ML)
1.3000 mg/m2 | Freq: Once | INTRAMUSCULAR | Status: AC
  Administered 2013-04-12: 3 mg via SUBCUTANEOUS
  Filled 2013-04-12: qty 3

## 2013-04-12 MED ORDER — DEXAMETHASONE 4 MG PO TABS
40.0000 mg | ORAL_TABLET | Freq: Once | ORAL | Status: AC
  Administered 2013-04-12: 40 mg via ORAL

## 2013-04-12 NOTE — Patient Instructions (Addendum)
Cancer Center Discharge Instructions for Patients Receiving Chemotherapy  Today you received the following chemotherapy agents velcade.  To help prevent nausea and vomiting after your treatment, we encourage you to take your nausea medication compazine.   If you develop nausea and vomiting that is not controlled by your nausea medication, call the clinic.   BELOW ARE SYMPTOMS THAT SHOULD BE REPORTED IMMEDIATELY:  *FEVER GREATER THAN 100.5 F  *CHILLS WITH OR WITHOUT FEVER  NAUSEA AND VOMITING THAT IS NOT CONTROLLED WITH YOUR NAUSEA MEDICATION  *UNUSUAL SHORTNESS OF BREATH  *UNUSUAL BRUISING OR BLEEDING  TENDERNESS IN MOUTH AND THROAT WITH OR WITHOUT PRESENCE OF ULCERS  *URINARY PROBLEMS  *BOWEL PROBLEMS  UNUSUAL RASH Items with * indicate a potential emergency and should be followed up as soon as possible.  Feel free to call the clinic you have any questions or concerns. The clinic phone number is [phone].    

## 2013-04-19 ENCOUNTER — Other Ambulatory Visit (HOSPITAL_BASED_OUTPATIENT_CLINIC_OR_DEPARTMENT_OTHER): Payer: Medicare Other | Admitting: Lab

## 2013-04-19 ENCOUNTER — Ambulatory Visit (HOSPITAL_BASED_OUTPATIENT_CLINIC_OR_DEPARTMENT_OTHER): Payer: Medicare Other

## 2013-04-19 ENCOUNTER — Telehealth: Payer: Self-pay | Admitting: *Deleted

## 2013-04-19 ENCOUNTER — Ambulatory Visit (HOSPITAL_BASED_OUTPATIENT_CLINIC_OR_DEPARTMENT_OTHER): Payer: Medicare Other | Admitting: Nurse Practitioner

## 2013-04-19 ENCOUNTER — Telehealth: Payer: Self-pay | Admitting: Oncology

## 2013-04-19 VITALS — BP 151/84 | HR 77 | Temp 96.7°F | Resp 20 | Ht 72.0 in | Wt 261.0 lb

## 2013-04-19 DIAGNOSIS — Z5112 Encounter for antineoplastic immunotherapy: Secondary | ICD-10-CM

## 2013-04-19 DIAGNOSIS — R42 Dizziness and giddiness: Secondary | ICD-10-CM

## 2013-04-19 DIAGNOSIS — K219 Gastro-esophageal reflux disease without esophagitis: Secondary | ICD-10-CM

## 2013-04-19 DIAGNOSIS — C9000 Multiple myeloma not having achieved remission: Secondary | ICD-10-CM

## 2013-04-19 DIAGNOSIS — Z86718 Personal history of other venous thrombosis and embolism: Secondary | ICD-10-CM

## 2013-04-19 LAB — CBC WITH DIFFERENTIAL/PLATELET
BASO%: 0.5 % (ref 0.0–2.0)
EOS%: 1.6 % (ref 0.0–7.0)
MCH: 29.8 pg (ref 27.2–33.4)
MCHC: 34.3 g/dL (ref 32.0–36.0)
NEUT#: 2.8 10*3/uL (ref 1.5–6.5)
Platelets: 101 10*3/uL — ABNORMAL LOW (ref 140–400)
RDW: 17.2 % — ABNORMAL HIGH (ref 11.0–14.6)
lymph#: 0.9 10*3/uL (ref 0.9–3.3)

## 2013-04-19 LAB — COMPREHENSIVE METABOLIC PANEL (CC13)
ALT: 9 U/L (ref 0–55)
AST: 21 U/L (ref 5–34)
Albumin: 3.1 g/dL — ABNORMAL LOW (ref 3.5–5.0)
Alkaline Phosphatase: 51 U/L (ref 40–150)
Anion Gap: 8 mEq/L (ref 3–11)
Calcium: 8.7 mg/dL (ref 8.4–10.4)
Chloride: 109 mEq/L (ref 98–109)
Creatinine: 0.8 mg/dL (ref 0.7–1.3)
Potassium: 3.7 mEq/L (ref 3.5–5.1)
Total Protein: 6.2 g/dL — ABNORMAL LOW (ref 6.4–8.3)

## 2013-04-19 MED ORDER — ONDANSETRON HCL 8 MG PO TABS
8.0000 mg | ORAL_TABLET | Freq: Once | ORAL | Status: AC
  Administered 2013-04-19: 8 mg via ORAL

## 2013-04-19 MED ORDER — ONDANSETRON HCL 8 MG PO TABS
ORAL_TABLET | ORAL | Status: AC
  Filled 2013-04-19: qty 1

## 2013-04-19 MED ORDER — BORTEZOMIB CHEMO SQ INJECTION 3.5 MG (2.5MG/ML)
1.3000 mg/m2 | Freq: Once | INTRAMUSCULAR | Status: AC
  Administered 2013-04-19: 3 mg via SUBCUTANEOUS
  Filled 2013-04-19: qty 3

## 2013-04-19 MED ORDER — DEXAMETHASONE 4 MG PO TABS
40.0000 mg | ORAL_TABLET | Freq: Once | ORAL | Status: AC
  Administered 2013-04-19: 40 mg via ORAL

## 2013-04-19 MED ORDER — DEXAMETHASONE 4 MG PO TABS
ORAL_TABLET | ORAL | Status: AC
  Filled 2013-04-19: qty 10

## 2013-04-19 NOTE — Telephone Encounter (Signed)
Per staff message and POF I have scheduled appts.  JMW  

## 2013-04-19 NOTE — Telephone Encounter (Signed)
gv and printed apt sched and avs for pt for OCT and NOV...emailed MW to add tx.

## 2013-04-19 NOTE — Progress Notes (Signed)
OFFICE PROGRESS NOTE  Interval history:  [name] is a 77 year old man with multiple myeloma. He continues Cytoxan/Velcade/Decadron. Most recent serum M spike on 03/22/2013 was stable at 0.58. He is seen today for scheduled followup.   He denies nausea/vomiting. No mouth sores. No diarrhea. No further rectal bleeding since the hemorrhoid banding procedure. He is fatigued. He has continued dyspnea on exertion. He reports he has been referred to cardiology. He denies fevers. He has an intermittent "burning sensation" in the fingertips. He denies numbness/tingling in the feet. He periodically notes a "burning" sensation after eating.   Objective: Blood pressure 151/84, pulse 77, temperature 96.7 F (35.9 C), temperature source Oral, resp. rate 20, height 6' (1.829 m), weight 261 lb (118.389 kg).  Oropharynx is without thrush or ulceration. No palpable cervical or supraclavicular lymph nodes. Lungs are clear. No wheezes or rales. Regular cardiac rhythm. Abdomen is soft and nontender. No hepatomegaly. Trace lower leg edema bilaterally. No skin rash.  Lab Results: Lab Results  Component Value Date   WBC 4.7 04/19/2013   HGB 12.2* 04/19/2013   HCT 35.4* 04/19/2013   MCV 86.9 04/19/2013   PLT 101* 04/19/2013    Chemistry:    Chemistry      Component Value Date/Time   NA 141 04/19/2013 1103   NA 136 02/10/2013 1928   K 3.7 04/19/2013 1103   K 3.6 02/10/2013 1928   CL 100 02/10/2013 1928   CL 106 12/28/2012 1059   CO2 24 04/19/2013 1103   CO2 26 02/10/2013 1928   BUN 7.9 04/19/2013 1103   BUN 18 02/10/2013 1928   CREATININE 0.8 04/19/2013 1103   CREATININE 0.82 02/10/2013 1928   CREATININE 0.89 01/31/2013 1217      Component Value Date/Time   CALCIUM 8.7 04/19/2013 1103   CALCIUM 8.5 02/10/2013 1928   ALKPHOS 51 04/19/2013 1103   ALKPHOS 54 02/10/2013 1928   AST 21 04/19/2013 1103   AST 19 02/10/2013 1928   ALT 9 04/19/2013 1103   ALT 14 02/10/2013 1928   BILITOT 0.44 04/19/2013 1103   BILITOT 0.2* 02/10/2013  1928       Studies/Results: Dg Chest 2 View  04/09/2013   CLINICAL DATA:  Shortness of breath with exertion.  EXAM: CHEST  2 VIEW  COMPARISON:  02/10/2013.  FINDINGS: Trachea is midline. Heart size normal. Minimal atelectasis or scarring at the lung bases, left greater than right. Biapical pleural thickening. Lungs are otherwise clear. No pleural fluid.  IMPRESSION: Minimal bibasilar atelectasis or scarring.   Electronically Signed   By: Leanna Battles M.D.   On: 04/09/2013 16:49    Medications: I have reviewed the patient's current medications.  Assessment/Plan:  1. Multiple myeloma, status post treatment with melphalan/prednisone/thalidomide beginning in November 2009. The thalidomide was increased to 200 mg daily beginning 09/11/2008. The serum M spike was slightly improved on 03/18/2009 and slightly increased on 05/09/2009. He has been maintained off of specific therapy for multiple myeloma since September 2010. The serum M spike was increased on 10/20/2011 at 4.6. He began Revlimid on 11/11/2011. He takes weekly dexamethasone. He began cycle 2 of Revlimid on 12/09/2011. The serum M spike was improved on 12/31/2011. The serum M spike was slightly higher on 01/28/2012. He began cycle 4 on 02/03/2012. He developed recurrent hypercalcemia. Treatment was changed to Cytoxan/Velcade/Decadron beginning 03/02/2012. The serum M spike was lower on 02/25/2012. The serum M spike was stable on 06/09/2012. The serum M spike improved on 08/03/2012 and stable on  08/31/2012. The IgG level was stable on 08/31/2012. The IgG level and serum M spike were improved on 10/05/2012. The IgG level and serum M spike further improved on 11/02/2012 and 11/30/2012. IgG and serum M spike were overall stable on 12/28/2012; improved 02/15/2013; stable 03/22/2013. 2. Chronic "dizziness." By his description of symptoms it is unclear if he is experiencing dizziness. He will followup with his primary physician. 3. Chest x-ray  11/01/2008 with a patchy opacity at the right midlung suspicious for pneumonia. He was treated with Avelox. 4. Low back pain with a radicular component. An MRI on 10/07/2008 showed no involvement of the lumbosacral spine with myeloma. 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 5. Bilateral neck pain, status post a CT 05/17/2008 with findings of spinal stenosis and osteoarthritis. 6. 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 Red cell microcytosis, likely related to hemoglobin C trait. A ferritin level returned elevated and a stool Hemoccult was negative 01/23/2010. He underwent a colonoscopy in 2009. 8. Elevated beta-2 microglobulin level. 9. History of hypertension. 10. History of a herniated disk at L4-L5 on MRI an 01/11/2002. 11. Hyperlipidemia. 12. Gastroesophageal reflux disease. 13. History of atypical angina. 14. History of rectal bleeding-large external hemorrhoids noted 02/25/2012. The stool was Hemoccult positive. He has a history of colon polyps. He is followed by [facility]. He was diagnosed with hemorrhoids in September of 2013 while hospitalized. He underwent a band procedure 03/09/2013. He has had no further rectal bleeding. 15. Superficial venous thrombosis of the greater saphenous vein on a Doppler 09/27/2008. Negative for deep vein thrombosis. Symptoms improved with aspirin. 16. Neck pain with numbness/tingling in the arms, hands, and low back with proximal right leg weakness. An MRI of the cervical spine 05/30/2009 showed multilevel spondylosis with mild cord edema at C4-C5 and C5-C6. He was noted to have an enhancing lesion of the cord at T3-T4 of unclear etiology. In the lumbar spine there was no change in the spondylosis at L3-L4 and L4-L5. There was no involvement of the cervical or lumbar spine with myeloma. He is status post  C4-C5, C5-C6, and C6-C7 anterior cervical diskectomy with fusion by Dr. Jordan Likes 08/01/2009. 17. History of mild thrombocytopenia. 18. Indeterminate-age deep vein thrombosis of the left lower extremity on a venous Doppler 08/15/2009. There were also findings consistent with superficial thrombosis involving the right lower extremity 08/15/2009. Coumadin was discontinued in October 2011. 19. Type 2 diabetes. 20. History of hematuria, followed at [facility]. Urinalysis was negative for blood on 07/23/2011. 21. Radicular back pain with MRI of the lumbar spine on 10/19/2011 showing nerve root impingement at L2-L3, L3-L4 and L4-L5 with the most severe at the right L3 nerve roots due to a large disc fragment. Associated right leg weakness. He underwent right L2-3 decompressive laminotomy with right L2 and L3 decompressive foraminotomy; right L2-3 microdiscectomy on 11/01/2011. 22. Constipation He continues a laxative regimen. 23. Hospitalization 11/08/2011 through 11/10/2011 with orthostatic hypotension/dizziness. He improved with intravenous hydration. He had mild hypotension when here on 11/18/2011. We recommended discontinuing Norvasc and Proscar. 24. Hypercalcemia status post pamidronate 11/03/2011. Recurrent hypercalcemia 02/16/2012 status post Zometa. The calcium has remained in normal range. 25. Fractured tooth with associated pain. Status post a tooth extraction by Dr. Kristin Bruins, he is to be scheduled for multiple tooth extractions in the near future. Zometa has been placed on hold. 26. Exertional dyspnea. Stable. He has been referred  to cardiology. 27. 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 Stable.  Disposition-Mr. Rideout appears stable. Most recent serum M spike and IgG were stable. We will followup on the levels from today. For now plan to continue Cytoxan/Velcade/Decadron on the current schedule. We will see him in followup in one month.  Plan  reviewed with Dr. Truett Perna.  [name] ANP/GNP-BC

## 2013-04-19 NOTE — Patient Instructions (Signed)
[facility] Discharge Instructions for Patients Receiving Chemotherapy  Today you received the following chemotherapy agents velcade SQ, and dexamethasone PO  To help prevent nausea and vomiting after your treatment, we encourage you to take your nausea medication starting this evening about 8 pm if needed   If you develop nausea and vomiting that is not controlled by your nausea medication, call the clinic.   BELOW ARE SYMPTOMS THAT SHOULD BE REPORTED IMMEDIATELY:  *FEVER GREATER THAN 100.5 F  *CHILLS WITH OR WITHOUT FEVER  NAUSEA AND VOMITING THAT IS NOT CONTROLLED WITH YOUR NAUSEA MEDICATION  *UNUSUAL SHORTNESS OF BREATH  *UNUSUAL BRUISING OR BLEEDING  TENDERNESS IN MOUTH AND THROAT WITH OR WITHOUT PRESENCE OF ULCERS  *URINARY PROBLEMS  *BOWEL PROBLEMS  UNUSUAL RASH Items with * indicate a potential emergency and should be followed up as soon as possible.  Feel free to call the clinic you have any questions or concerns. The clinic phone number is [phone].

## 2013-04-23 LAB — PROTEIN ELECTROPHORESIS, SERUM
Alpha-1-Globulin: 6.5 % — ABNORMAL HIGH (ref 2.9–4.9)
Beta 2: 2.4 % — ABNORMAL LOW (ref 3.2–6.5)
Beta Globulin: 6.2 % (ref 4.7–7.2)
Gamma Globulin: 14.7 % (ref 11.1–18.8)

## 2013-04-23 LAB — IGG: IgG (Immunoglobin G), Serum: 866 mg/dL (ref 650–1600)

## 2013-05-01 ENCOUNTER — Other Ambulatory Visit: Payer: Self-pay | Admitting: Oncology

## 2013-05-03 ENCOUNTER — Ambulatory Visit (HOSPITAL_BASED_OUTPATIENT_CLINIC_OR_DEPARTMENT_OTHER): Payer: Medicare Other

## 2013-05-03 ENCOUNTER — Other Ambulatory Visit (HOSPITAL_BASED_OUTPATIENT_CLINIC_OR_DEPARTMENT_OTHER): Payer: Medicare Other | Admitting: Lab

## 2013-05-03 VITALS — BP 149/75 | HR 83 | Temp 97.3°F | Resp 18

## 2013-05-03 DIAGNOSIS — C9000 Multiple myeloma not having achieved remission: Secondary | ICD-10-CM

## 2013-05-03 DIAGNOSIS — Z5112 Encounter for antineoplastic immunotherapy: Secondary | ICD-10-CM

## 2013-05-03 LAB — CBC WITH DIFFERENTIAL/PLATELET
BASO%: 1.4 % (ref 0.0–2.0)
Basophils Absolute: 0.1 10*3/uL (ref 0.0–0.1)
EOS%: 2.2 % (ref 0.0–7.0)
HGB: 12.5 g/dL — ABNORMAL LOW (ref 13.0–17.1)
MCH: 29.8 pg (ref 27.2–33.4)
MCHC: 34.5 g/dL (ref 32.0–36.0)
MONO#: 0.8 10*3/uL (ref 0.1–0.9)
MONO%: 17.5 % — ABNORMAL HIGH (ref 0.0–14.0)
RBC: 4.19 10*6/uL — ABNORMAL LOW (ref 4.20–5.82)
RDW: 16.3 % — ABNORMAL HIGH (ref 11.0–14.6)
lymph#: 1.1 10*3/uL (ref 0.9–3.3)

## 2013-05-03 MED ORDER — ONDANSETRON HCL 8 MG PO TABS
ORAL_TABLET | ORAL | Status: AC
  Filled 2013-05-03: qty 1

## 2013-05-03 MED ORDER — ONDANSETRON HCL 8 MG PO TABS
8.0000 mg | ORAL_TABLET | Freq: Once | ORAL | Status: AC
  Administered 2013-05-03: 8 mg via ORAL

## 2013-05-03 MED ORDER — DEXAMETHASONE 4 MG PO TABS
ORAL_TABLET | ORAL | Status: AC
  Filled 2013-05-03: qty 10

## 2013-05-03 MED ORDER — BORTEZOMIB CHEMO SQ INJECTION 3.5 MG (2.5MG/ML)
1.3000 mg/m2 | Freq: Once | INTRAMUSCULAR | Status: AC
  Administered 2013-05-03: 3 mg via SUBCUTANEOUS
  Filled 2013-05-03: qty 3

## 2013-05-03 MED ORDER — DEXAMETHASONE 4 MG PO TABS
40.0000 mg | ORAL_TABLET | Freq: Once | ORAL | Status: AC
  Administered 2013-05-03: 40 mg via ORAL

## 2013-05-03 NOTE — Patient Instructions (Signed)
[facility] Discharge Instructions for Patients Receiving Chemotherapy  Today you received the following chemotherapy agent of Velcade injection.  To help prevent nausea and vomiting after your treatment, we encourage you to take your nausea medication.   If you develop nausea and vomiting that is not controlled by your nausea medication, call the clinic.   BELOW ARE SYMPTOMS THAT SHOULD BE REPORTED IMMEDIATELY:  *FEVER GREATER THAN 100.5 F  *CHILLS WITH OR WITHOUT FEVER  NAUSEA AND VOMITING THAT IS NOT CONTROLLED WITH YOUR NAUSEA MEDICATION  *UNUSUAL SHORTNESS OF BREATH  *UNUSUAL BRUISING OR BLEEDING  TENDERNESS IN MOUTH AND THROAT WITH OR WITHOUT PRESENCE OF ULCERS  *URINARY PROBLEMS  *BOWEL PROBLEMS  UNUSUAL RASH Items with * indicate a potential emergency and should be followed up as soon as possible.  Feel free to call the clinic you have any questions or concerns. The clinic phone number is [phone].     Bortezomib injection What is this medicine? BORTEZOMIB (bor TEZ oh mib) is a chemotherapy drug. It slows the growth of cancer cells. This medicine is used to treat multiple myeloma, lymphoma, and other cancers. This medicine may be used for other purposes; ask your health care provider or pharmacist if you have questions. What should I tell my health care provider before I take this medicine? They need to know if you have any of these conditions: -heart disease -irregular heartbeat -liver disease -low blood counts, like low white blood cells, platelets, or hemoglobin -peripheral neuropathy -taking medicine for blood pressure -an unusual or allergic reaction to bortezomib, mannitol, boron, other medicines, foods, dyes, or preservatives -pregnant or trying to get pregnant -breast-feeding How should I use this medicine? This medicine is for injection into a vein or for injection under the skin. It is given by a health care professional in a  hospital or clinic setting. Talk to your pediatrician regarding the use of this medicine in children. Special care may be needed. Overdosage: If you think you have taken too much of this medicine contact a poison control center or emergency room at once. NOTE: This medicine is only for you. Do not share this medicine with others. What if I miss a dose? It is important not to miss your dose. Call your doctor or health care professional if you are unable to keep an appointment. What may interact with this medicine? -medicines for diabetes -medicines to increase blood counts like filgrastim, pegfilgrastim, sargramostim -zalcitabine Talk to your doctor or health care professional before taking any of these medicines: -acetaminophen -aspirin -ibuprofen -ketoprofen -naproxen This list may not describe all possible interactions. Give your health care provider a list of all the medicines, herbs, non-prescription drugs, or dietary supplements you use. Also tell them if you smoke, drink alcohol, or use illegal drugs. Some items may interact with your medicine. What should I watch for while using this medicine? Visit your doctor for checks on your progress. This drug may make you feel generally unwell. This is not uncommon, as chemotherapy can affect healthy cells as well as cancer cells. Report any side effects. Continue your course of treatment even though you feel ill unless your doctor tells you to stop. You may get drowsy or dizzy. Do not drive, use machinery, or do anything that needs mental alertness until you know how this medicine affects you. Do not stand or sit up quickly, especially if you are an older patient. This reduces the risk of dizzy or fainting spells. In some cases,  you may be given additional medicines to help with side effects. Follow all directions for their use. Call your doctor or health care professional for advice if you get a fever, chills or sore throat, or other symptoms of  a cold or flu. Do not treat yourself. This drug decreases your body's ability to fight infections. Try to avoid being around people who are sick. This medicine may increase your risk to bruise or bleed. Call your doctor or health care professional if you notice any unusual bleeding. Be careful brushing and flossing your teeth or using a toothpick because you may get an infection or bleed more easily. If you have any dental work done, tell your dentist you are receiving this medicine. Avoid taking products that contain aspirin, acetaminophen, ibuprofen, naproxen, or ketoprofen unless instructed by your doctor. These medicines may hide a fever. Do not become pregnant while taking this medicine. Women should inform their doctor if they wish to become pregnant or think they might be pregnant. There is a potential for serious side effects to an unborn child. Talk to your health care professional or pharmacist for more information. Do not breast-feed an infant while taking this medicine. You may have vomiting or diarrhea while taking this medicine. Drink water or other fluids as directed. What side effects may I notice from receiving this medicine? Side effects that you should report to your doctor or health care professional as soon as possible: -allergic reactions like skin rash, itching or hives, swelling of the face, lips, or tongue -breathing problems -changes in hearing -changes in vision -fast, irregular heartbeat -feeling faint or lightheaded, falls -pain, tingling, numbness in the hands or feet -seizures -swelling of the ankles, feet, hands -unusual bleeding or bruising -unusually weak or tired -vomiting Side effects that usually do not require medical attention (report to your doctor or health care professional if they continue or are bothersome): -changes in emotions or moods -constipation -diarrhea -loss of appetite -headache -irritation at site where injected -nausea This list may  not describe all possible side effects. Call your doctor for medical advice about side effects. You may report side effects to FDA at 1-800-FDA-1088. Where should I keep my medicine? This drug is given in a hospital or clinic and will not be stored at home. NOTE: This sheet is a summary. It may not cover all possible information. If you have questions about this medicine, talk to your doctor, pharmacist, or health care provider.  2013, [employer] Standard. (08/05/2010 11:42:36 AM)

## 2013-05-04 ENCOUNTER — Ambulatory Visit: Payer: Medicare Other | Admitting: Family Medicine

## 2013-05-09 ENCOUNTER — Ambulatory Visit: Payer: Medicare Other | Admitting: Family Medicine

## 2013-05-10 ENCOUNTER — Ambulatory Visit (HOSPITAL_BASED_OUTPATIENT_CLINIC_OR_DEPARTMENT_OTHER): Payer: Medicare Other

## 2013-05-10 ENCOUNTER — Telehealth: Payer: Self-pay | Admitting: *Deleted

## 2013-05-10 ENCOUNTER — Other Ambulatory Visit (HOSPITAL_BASED_OUTPATIENT_CLINIC_OR_DEPARTMENT_OTHER): Payer: Medicare Other | Admitting: Lab

## 2013-05-10 ENCOUNTER — Encounter (INDEPENDENT_AMBULATORY_CARE_PROVIDER_SITE_OTHER): Payer: Self-pay

## 2013-05-10 VITALS — BP 156/96 | HR 82 | Temp 97.1°F | Resp 20

## 2013-05-10 DIAGNOSIS — C9000 Multiple myeloma not having achieved remission: Secondary | ICD-10-CM

## 2013-05-10 DIAGNOSIS — Z5112 Encounter for antineoplastic immunotherapy: Secondary | ICD-10-CM

## 2013-05-10 LAB — CBC WITH DIFFERENTIAL/PLATELET
BASO%: 0.4 % (ref 0.0–2.0)
EOS%: 1.7 % (ref 0.0–7.0)
Eosinophils Absolute: 0.1 10*3/uL (ref 0.0–0.5)
LYMPH%: 18.4 % (ref 14.0–49.0)
MCHC: 34.5 g/dL (ref 32.0–36.0)
MCV: 85.6 fL (ref 79.3–98.0)
MONO%: 17.1 % — ABNORMAL HIGH (ref 0.0–14.0)
Platelets: 110 10*3/uL — ABNORMAL LOW (ref 140–400)
RBC: 4.11 10*6/uL — ABNORMAL LOW (ref 4.20–5.82)

## 2013-05-10 MED ORDER — DEXAMETHASONE 4 MG PO TABS
40.0000 mg | ORAL_TABLET | Freq: Once | ORAL | Status: AC
  Administered 2013-05-10: 40 mg via ORAL

## 2013-05-10 MED ORDER — BORTEZOMIB CHEMO SQ INJECTION 3.5 MG (2.5MG/ML)
1.3000 mg/m2 | Freq: Once | INTRAMUSCULAR | Status: AC
  Administered 2013-05-10: 3 mg via SUBCUTANEOUS
  Filled 2013-05-10: qty 3

## 2013-05-10 MED ORDER — LACTULOSE 10 GM/15ML PO SOLN: 10.0000 g | Freq: Two times a day (BID) | ORAL | Status: DC | PRN

## 2013-05-10 MED ORDER — ONDANSETRON HCL 8 MG PO TABS
8.0000 mg | ORAL_TABLET | Freq: Once | ORAL | Status: AC
  Administered 2013-05-10: 8 mg via ORAL

## 2013-05-10 MED ORDER — ONDANSETRON HCL 8 MG PO TABS
ORAL_TABLET | ORAL | Status: AC
  Filled 2013-05-10: qty 1

## 2013-05-10 MED ORDER — DEXAMETHASONE 4 MG PO TABS
ORAL_TABLET | ORAL | Status: AC
  Filled 2013-05-10: qty 10

## 2013-05-10 NOTE — Telephone Encounter (Signed)
Wife called to request prescription laxative refill-Lactulose. OTC laxative not working as well as it used to (MiraLax bid).

## 2013-05-10 NOTE — Patient Instructions (Signed)
[facility] Discharge Instructions for Patients Receiving Chemotherapy  Today you received the following chemotherapy agent of Velcade injection.  To help prevent nausea and vomiting after your treatment, we encourage you to take your nausea medication.   If you develop nausea and vomiting that is not controlled by your nausea medication, call the clinic.   BELOW ARE SYMPTOMS THAT SHOULD BE REPORTED IMMEDIATELY:  *FEVER GREATER THAN 100.5 F  *CHILLS WITH OR WITHOUT FEVER  NAUSEA AND VOMITING THAT IS NOT CONTROLLED WITH YOUR NAUSEA MEDICATION  *UNUSUAL SHORTNESS OF BREATH  *UNUSUAL BRUISING OR BLEEDING  TENDERNESS IN MOUTH AND THROAT WITH OR WITHOUT PRESENCE OF ULCERS  *URINARY PROBLEMS  *BOWEL PROBLEMS  UNUSUAL RASH Items with * indicate a potential emergency and should be followed up as soon as possible.  Feel free to call the clinic you have any questions or concerns. The clinic phone number is [phone].     Bortezomib injection What is this medicine? BORTEZOMIB (bor TEZ oh mib) is a chemotherapy drug. It slows the growth of cancer cells. This medicine is used to treat multiple myeloma, lymphoma, and other cancers. This medicine may be used for other purposes; ask your health care provider or pharmacist if you have questions. What should I tell my health care provider before I take this medicine? They need to know if you have any of these conditions: -heart disease -irregular heartbeat -liver disease -low blood counts, like low white blood cells, platelets, or hemoglobin -peripheral neuropathy -taking medicine for blood pressure -an unusual or allergic reaction to bortezomib, mannitol, boron, other medicines, foods, dyes, or preservatives -pregnant or trying to get pregnant -breast-feeding How should I use this medicine? This medicine is for injection into a vein or for injection under the skin. It is given by a health care professional in a  hospital or clinic setting. Talk to your pediatrician regarding the use of this medicine in children. Special care may be needed. Overdosage: If you think you have taken too much of this medicine contact a poison control center or emergency room at once. NOTE: This medicine is only for you. Do not share this medicine with others. What if I miss a dose? It is important not to miss your dose. Call your doctor or health care professional if you are unable to keep an appointment. What may interact with this medicine? -medicines for diabetes -medicines to increase blood counts like filgrastim, pegfilgrastim, sargramostim -zalcitabine Talk to your doctor or health care professional before taking any of these medicines: -acetaminophen -aspirin -ibuprofen -ketoprofen -naproxen This list may not describe all possible interactions. Give your health care provider a list of all the medicines, herbs, non-prescription drugs, or dietary supplements you use. Also tell them if you smoke, drink alcohol, or use illegal drugs. Some items may interact with your medicine. What should I watch for while using this medicine? Visit your doctor for checks on your progress. This drug may make you feel generally unwell. This is not uncommon, as chemotherapy can affect healthy cells as well as cancer cells. Report any side effects. Continue your course of treatment even though you feel ill unless your doctor tells you to stop. You may get drowsy or dizzy. Do not drive, use machinery, or do anything that needs mental alertness until you know how this medicine affects you. Do not stand or sit up quickly, especially if you are an older patient. This reduces the risk of dizzy or fainting spells. In some cases,   you may be given additional medicines to help with side effects. Follow all directions for their use. Call your doctor or health care professional for advice if you get a fever, chills or sore throat, or other symptoms of  a cold or flu. Do not treat yourself. This drug decreases your body's ability to fight infections. Try to avoid being around people who are sick. This medicine may increase your risk to bruise or bleed. Call your doctor or health care professional if you notice any unusual bleeding. Be careful brushing and flossing your teeth or using a toothpick because you may get an infection or bleed more easily. If you have any dental work done, tell your dentist you are receiving this medicine. Avoid taking products that contain aspirin, acetaminophen, ibuprofen, naproxen, or ketoprofen unless instructed by your doctor. These medicines may hide a fever. Do not become pregnant while taking this medicine. Women should inform their doctor if they wish to become pregnant or think they might be pregnant. There is a potential for serious side effects to an unborn child. Talk to your health care professional or pharmacist for more information. Do not breast-feed an infant while taking this medicine. You may have vomiting or diarrhea while taking this medicine. Drink water or other fluids as directed. What side effects may I notice from receiving this medicine? Side effects that you should report to your doctor or health care professional as soon as possible: -allergic reactions like skin rash, itching or hives, swelling of the face, lips, or tongue -breathing problems -changes in hearing -changes in vision -fast, irregular heartbeat -feeling faint or lightheaded, falls -pain, tingling, numbness in the hands or feet -seizures -swelling of the ankles, feet, hands -unusual bleeding or bruising -unusually weak or tired -vomiting Side effects that usually do not require medical attention (report to your doctor or health care professional if they continue or are bothersome): -changes in emotions or moods -constipation -diarrhea -loss of appetite -headache -irritation at site where injected -nausea This list may  not describe all possible side effects. Call your doctor for medical advice about side effects. You may report side effects to FDA at 1-800-FDA-1088. Where should I keep my medicine? This drug is given in a hospital or clinic and will not be stored at home. NOTE: This sheet is a summary. It may not cover all possible information. If you have questions about this medicine, talk to your doctor, pharmacist, or health care provider.  2013, [employer]vier/[employer] Standard. (08/05/2010 11:42:36 AM)  

## 2013-05-11 ENCOUNTER — Ambulatory Visit (INDEPENDENT_AMBULATORY_CARE_PROVIDER_SITE_OTHER): Payer: Medicare Other | Admitting: Family Medicine

## 2013-05-11 ENCOUNTER — Encounter: Payer: Self-pay | Admitting: Family Medicine

## 2013-05-11 VITALS — BP 154/100 | HR 95 | Temp 97.3°F | Ht 72.0 in | Wt 260.4 lb

## 2013-05-11 DIAGNOSIS — M5126 Other intervertebral disc displacement, lumbar region: Secondary | ICD-10-CM

## 2013-05-11 DIAGNOSIS — I10 Essential (primary) hypertension: Secondary | ICD-10-CM

## 2013-05-11 DIAGNOSIS — G4733 Obstructive sleep apnea (adult) (pediatric): Secondary | ICD-10-CM

## 2013-05-11 DIAGNOSIS — N289 Disorder of kidney and ureter, unspecified: Secondary | ICD-10-CM

## 2013-05-11 DIAGNOSIS — C9000 Multiple myeloma not having achieved remission: Secondary | ICD-10-CM

## 2013-05-11 DIAGNOSIS — M5116 Intervertebral disc disorders with radiculopathy, lumbar region: Secondary | ICD-10-CM

## 2013-05-11 DIAGNOSIS — E782 Mixed hyperlipidemia: Secondary | ICD-10-CM

## 2013-05-11 MED ORDER — AMLODIPINE BESYLATE 2.5 MG PO TABS: 2.5000 mg | ORAL_TABLET | Freq: Every day | ORAL | Status: DC

## 2013-05-11 NOTE — Progress Notes (Signed)
Patient ID: [name], male   DOB: 1936/06/29, 77 y.o.   MRN: [id number] SUBJECTIVE: CC: Chief Complaint  Patient presents with  . Follow-up    3 month follow up    HPI: Continues to do well with chemotherapy for Multiple myeloma  BP is elevated.  Feels tired at times and Dyspneic. Has appointment with cardiology for  Re-evaluation. Nothing else new.  Records reviewed on visits with oncology.   Past Medical History  Diagnosis Date  . GERD (gastroesophageal reflux disease)   . Essential hypertension, benign   . BPH (benign prostatic hyperplasia)   . Lumbar herniated disc     L4-5  . Hyperlipidemia   . Sleep apnea   . Multiple myeloma 02/19/2008  . Arthritis   . Diverticulosis   . Peptic ulcer disease   . Tubular adenoma 02/16/2008    Dr. Stan Head   Past Surgical History  Procedure Laterality Date  . Neck surgery    . Lumbar laminectomy/decompression microdiscectomy  11/01/2011    Procedure: LUMBAR LAMINECTOMY/DECOMPRESSION MICRODISCECTOMY 2 LEVELS;  Surgeon: Temple Pacini, MD;  Location: [facility];  Service: Neurosurgery;  Laterality: Right;  Lumbar Laminectomy/Microdiscectomy Decompression Lumbar Three-Four, Lumbar Four-Five   . Colonoscopy    . Hemorrhoid banding  2014   History   Social History  . Marital Status: Married    Spouse Name: N/A    Number of Children: 4  . Years of Education: N/A   Occupational History  . retired-disabled-Construction    Social History Main Topics  . Smoking status: Former Smoker -- 2.00 packs/day for 20 years    Types: Cigarettes    Quit date: 07/12/1990  . Smokeless tobacco: Never Used  . Alcohol Use: No  . Drug Use: No  . Sexual Activity: Not on file   Other Topics Concern  . Not on file   Social History Narrative   Pt was adopted.   Married   Probation officer   Family History  Problem Relation Age of Onset  . Adopted: Yes   Current Outpatient Prescriptions on File Prior to Visit  Medication Sig Dispense  Refill  . acyclovir (ZOVIRAX) 400 MG tablet TAKE 1 TABLET (400 MG TOTAL) BY MOUTH 2 (TWO) TIMES DAILY.  60 tablet  2  . aspirin 325 MG EC tablet Take 1 tablet (325 mg total) by mouth 2 (two) times daily. Do not take again until March 20, 2013      . atorvastatin (LIPITOR) 20 MG tablet Take 1 tablet (20 mg total) by mouth daily.  30 tablet  3  . co-enzyme Q-10 50 MG capsule Take 4 capsules (200 mg total) by mouth daily.  120 capsule  11  . cyclophosphamide (CYTOXAN) 50 MG tablet Take 12 tablets (600 mg total) by mouth once a week. Give on an empty stomach 1 hour before or 2 hours after meals.  48 tablet  1  . dexamethasone (DECADRON) 4 MG tablet TAKE 5 TABS ON Thursday YOU DON'T COME TO CANCER CENTER FOR TREATMENT  20 tablet  1  . fluticasone (FLONASE) 50 MCG/ACT nasal spray Place 2 sprays into the nose daily.  16 g  6  . lactulose (CHRONULAC) 10 GM/15ML solution Take 15 mLs (10 g total) by mouth 2 (two) times daily as needed.  500 mL  0  . NITROSTAT 0.4 MG SL tablet Place 0.4 mg under the tongue as needed.      Marland Kitchen omeprazole (PRILOSEC) 20 MG capsule TAKE 1 CAPSULE (  20 MG TOTAL) BY MOUTH DAILY.  30 capsule  5  . polyethylene glycol (MIRALAX / GLYCOLAX) packet Take 17 g by mouth 2 (two) times daily.       . potassium chloride SA (KLOR-CON M20) 20 MEQ tablet TAKE 1 TABLET EVERY DAY  30 tablet  0  . PRESCRIPTION MEDICATION Inject as directed every 7 (seven) days. Velcade 3mg  injection q7d on Thursdays, skips every 4th week.      . prochlorperazine (COMPAZINE) 10 MG tablet Take 10 mg by mouth every 6 (six) hours as needed (for nausea).      . tamsulosin (FLOMAX) 0.4 MG CAPS Take 1 capsule (0.4 mg total) by mouth 2 (two) times daily with a meal.  60 capsule  5  . zolpidem (AMBIEN) 5 MG tablet Take 5 mg by mouth at bedtime as needed.        No current facility-administered medications on file prior to visit.   Allergies  Allergen Reactions  . Penicillins Hives and Rash   Immunization History   Administered Date(s) Administered  . Influenza Split 04/23/2011, 04/11/2012  . Influenza,inj,Quad PF,36+ Mos 04/09/2013  . Tdap 07/13/2007   Prior to Admission medications   Medication Sig Start Date End Date Taking? Authorizing Provider  acyclovir (ZOVIRAX) 400 MG tablet TAKE 1 TABLET (400 MG TOTAL) BY MOUTH 2 (TWO) TIMES DAILY. 02/13/13   Ladene Artist, MD  aspirin 325 MG EC tablet Take 1 tablet (325 mg total) by mouth 2 (two) times daily. Do not take again until March 20, 2013 03/06/13   Iva Boop, MD  atorvastatin (LIPITOR) 20 MG tablet Take 1 tablet (20 mg total) by mouth daily. 02/02/13   Ileana Ladd, MD  co-enzyme Q-10 50 MG capsule Take 4 capsules (200 mg total) by mouth daily. 01/31/13   Ileana Ladd, MD  cyclophosphamide (CYTOXAN) 50 MG tablet Take 12 tablets (600 mg total) by mouth once a week. Give on an empty stomach 1 hour before or 2 hours after meals. 03/22/13   Ladene Artist, MD  dexamethasone (DECADRON) 4 MG tablet TAKE 5 TABS ON Thursday YOU DON'T COME TO CANCER CENTER FOR TREATMENT 03/22/13   Rana Snare, NP  fluticasone [facility]) 50 MCG/ACT nasal spray Place 2 sprays into the nose daily. 03/23/13   Ileana Ladd, MD  lactulose (CHRONULAC) 10 GM/15ML solution Take 15 mLs (10 g total) by mouth 2 (two) times daily as needed. 05/10/13   Ladene Artist, MD  NITROSTAT 0.4 MG SL tablet Place 0.4 mg under the tongue as needed. 12/07/12   Historical Provider, MD  omeprazole (PRILOSEC) 20 MG capsule TAKE 1 CAPSULE (20 MG TOTAL) BY MOUTH DAILY. 03/24/13   Ileana Ladd, MD  polyethylene glycol (MIRALAX / Ethelene Hal) packet Take 17 g by mouth 2 (two) times daily.     Historical Provider, MD  potassium chloride SA (KLOR-CON M20) 20 MEQ tablet TAKE 1 TABLET EVERY DAY 02/13/13   Ladene Artist, MD  PRESCRIPTION MEDICATION Inject as directed every 7 (seven) days. Velcade 3mg  injection q7d on Thursdays, skips every 4th week.    Historical Provider, MD  prochlorperazine (COMPAZINE)  10 MG tablet Take 10 mg by mouth every 6 (six) hours as needed (for nausea).    Historical Provider, MD  tamsulosin (FLOMAX) 0.4 MG CAPS Take 1 capsule (0.4 mg total) by mouth 2 (two) times daily with a meal. 12/18/12   Ileana Ladd, MD  zolpidem (AMBIEN) 5 MG tablet  Take 5 mg by mouth at bedtime as needed.  04/05/13   Historical Provider, MD     ROS: As above in the HPI. All other systems are stable or negative.  OBJECTIVE: APPEARANCE:  Patient in no acute distress.The patient appeared well nourished and normally developed. Acyanotic. Waist: VITAL SIGNS:BP 154/100  Pulse 95  Temp(Src) 97.3 F (36.3 C) (Oral)  Ht 6' (1.829 m)  Wt 260 lb 6.4 oz (118.117 kg)  BMI 35.31 kg/m2  Obese AAM bp recheck 155/100  SKIN: warm and  Dry without overt rashes, tattoos and scars  HEAD and Neck: without JVD, Head and scalp: normal Eyes:No scleral icterus. Fundi normal, eye movements normal. Ears: Auricle normal, canal normal, Tympanic membranes normal, insufflation normal. Nose: normal Throat: normal Neck & thyroid: normal  CHEST & LUNGS: Chest wall: normal Lungs: Clear  CVS: Reveals the PMI to be normally located. Regular rhythm, First and Second Heart sounds are normal,  absence of murmurs, rubs or gallops. Peripheral vasculature: Radial pulses: normal Dorsal pedis pulses: normal Posterior pulses: normal  ABDOMEN:  Appearance: Obese Benign, no organomegaly, no masses, no Abdominal Aortic enlargement. No Guarding , no rebound. No Bruits. Bowel sounds: normal  RECTAL: N/A GU: N/A  EXTREMETIES: nonedematous.  NEUROLOGIC: oriented to time,place and person; nonfocal.  ASSESSMENT: Multiple myeloma  Mixed hyperlipidemia  Obstructive sleep apnea  Essential hypertension, benign - Plan: amLODipine (NORVASC) 2.5 MG tablet  Lumbar disc herniation with radiculopathy  Renal insufficiency  PLAN:  No orders of the defined types were placed in this encounter.   Meds  ordered this encounter  Medications  . amLODipine (NORVASC) 2.5 MG tablet    Sig: Take 1 tablet (2.5 mg total) by mouth daily.    Dispense:  30 tablet    Refill:  3  low salt diet  There are no discontinued medications. Return in about 4 weeks (around 06/08/2013) for recheck BP.  Joe Tanney P. [place] Charon, M.D.

## 2013-05-17 ENCOUNTER — Other Ambulatory Visit: Payer: Self-pay | Admitting: *Deleted

## 2013-05-17 ENCOUNTER — Ambulatory Visit (HOSPITAL_BASED_OUTPATIENT_CLINIC_OR_DEPARTMENT_OTHER): Payer: Medicare Other

## 2013-05-17 ENCOUNTER — Ambulatory Visit (HOSPITAL_BASED_OUTPATIENT_CLINIC_OR_DEPARTMENT_OTHER): Payer: Medicare Other | Admitting: Oncology

## 2013-05-17 ENCOUNTER — Telehealth: Payer: Self-pay | Admitting: Oncology

## 2013-05-17 ENCOUNTER — Telehealth: Payer: Self-pay | Admitting: *Deleted

## 2013-05-17 ENCOUNTER — Other Ambulatory Visit (HOSPITAL_BASED_OUTPATIENT_CLINIC_OR_DEPARTMENT_OTHER): Payer: Medicare Other | Admitting: Lab

## 2013-05-17 VITALS — BP 144/83 | HR 76 | Temp 97.7°F | Resp 18 | Ht 72.0 in | Wt 260.0 lb

## 2013-05-17 DIAGNOSIS — Z5112 Encounter for antineoplastic immunotherapy: Secondary | ICD-10-CM

## 2013-05-17 DIAGNOSIS — D630 Anemia in neoplastic disease: Secondary | ICD-10-CM

## 2013-05-17 DIAGNOSIS — C9000 Multiple myeloma not having achieved remission: Secondary | ICD-10-CM

## 2013-05-17 DIAGNOSIS — Z86718 Personal history of other venous thrombosis and embolism: Secondary | ICD-10-CM

## 2013-05-17 DIAGNOSIS — E119 Type 2 diabetes mellitus without complications: Secondary | ICD-10-CM

## 2013-05-17 DIAGNOSIS — K5900 Constipation, unspecified: Secondary | ICD-10-CM

## 2013-05-17 LAB — CBC WITH DIFFERENTIAL/PLATELET
BASO%: 0.7 % (ref 0.0–2.0)
Basophils Absolute: 0 10*3/uL (ref 0.0–0.1)
HCT: 37.1 % — ABNORMAL LOW (ref 38.4–49.9)
HGB: 12.5 g/dL — ABNORMAL LOW (ref 13.0–17.1)
LYMPH%: 23.9 % (ref 14.0–49.0)
MCH: 29 pg (ref 27.2–33.4)
MONO#: 0.9 10*3/uL (ref 0.1–0.9)
NEUT%: 52.6 % (ref 39.0–75.0)
Platelets: 105 10*3/uL — ABNORMAL LOW (ref 140–400)
WBC: 4.3 10*3/uL (ref 4.0–10.3)
lymph#: 1 10*3/uL (ref 0.9–3.3)

## 2013-05-17 LAB — COMPREHENSIVE METABOLIC PANEL (CC13)
ALT: 9 U/L (ref 0–55)
Albumin: 3.4 g/dL — ABNORMAL LOW (ref 3.5–5.0)
Anion Gap: 9 mEq/L (ref 3–11)
BUN: 12.3 mg/dL (ref 7.0–26.0)
CO2: 24 mEq/L (ref 22–29)
Calcium: 9.2 mg/dL (ref 8.4–10.4)
Chloride: 108 mEq/L (ref 98–109)
Creatinine: 1 mg/dL (ref 0.7–1.3)
Glucose: 94 mg/dl (ref 70–140)
Potassium: 3.9 mEq/L (ref 3.5–5.1)
Total Bilirubin: 0.68 mg/dL (ref 0.20–1.20)

## 2013-05-17 MED ORDER — DEXAMETHASONE 4 MG PO TABS
ORAL_TABLET | ORAL | Status: AC
  Filled 2013-05-17: qty 10

## 2013-05-17 MED ORDER — CYCLOPHOSPHAMIDE 50 MG PO TABS: 600.0000 mg | ORAL_TABLET | ORAL | Status: DC

## 2013-05-17 MED ORDER — DEXAMETHASONE 4 MG PO TABS: 20.0000 mg | ORAL_TABLET | ORAL | Status: DC

## 2013-05-17 MED ORDER — DEXAMETHASONE 4 MG PO TABS
40.0000 mg | ORAL_TABLET | Freq: Once | ORAL | Status: AC
  Administered 2013-05-17: 40 mg via ORAL

## 2013-05-17 MED ORDER — BORTEZOMIB CHEMO SQ INJECTION 3.5 MG (2.5MG/ML)
1.3000 mg/m2 | Freq: Once | INTRAMUSCULAR | Status: AC
  Administered 2013-05-17: 3 mg via SUBCUTANEOUS
  Filled 2013-05-17: qty 3

## 2013-05-17 MED ORDER — ONDANSETRON HCL 8 MG PO TABS
8.0000 mg | ORAL_TABLET | Freq: Once | ORAL | Status: AC
  Administered 2013-05-17: 8 mg via ORAL

## 2013-05-17 MED ORDER — ONDANSETRON HCL 8 MG PO TABS
ORAL_TABLET | ORAL | Status: AC
  Filled 2013-05-17: qty 1

## 2013-05-17 NOTE — Progress Notes (Signed)
[facility]    OFFICE PROGRESS NOTE   INTERVAL HISTORY:   [name] returns as scheduled. He continues to a weekly Cytoxan/Decadron. He takes Velcade three out of four weeks. Stable neuropathy symptoms. This does not interfere with activity. He complains of a "sore "at the right side of the mouth.  Objective:  Vital signs in last 24 hours:  Blood pressure 144/83, pulse 76, temperature 97.7 F (36.5 C), temperature source Oral, resp. rate 18, height 6' (1.829 m), weight 260 lb (117.935 kg).    HEENT: 2-3 mm healing ulceration at the right buccal mucosa. No thrush Resp: Faint end inspiratory rhonchi at the posterior base bilaterally, no respiratory distress Cardio: Regular rate and rhythm GI: No hepatosplenomegaly Vascular: 1+ pitting edema at the ankle and low pretibial area bilaterally Neuro: Moderate loss of vibratory sense at the fingertips bilaterally      Lab Results:  Lab Results  Component Value Date   WBC 4.3 05/17/2013   HGB 12.5* 05/17/2013   HCT 37.1* 05/17/2013   MCV 86.5 05/17/2013   PLT 105* 05/17/2013   ANC 2.3  Serum M spike on 04/19/2013-0.42 IgG on 04/19/2013-866  Medications: I have reviewed the patient's current medications.  Assessment/Plan: 1. Multiple myeloma, status post treatment with melphalan/prednisone/thalidomide beginning in November 2009. The thalidomide was increased to 200 mg daily beginning 09/11/2008. The serum M spike was slightly improved on 03/18/2009 and slightly increased on 05/09/2009. He has been maintained off of specific therapy for multiple myeloma since September 2010. The serum M spike was increased on 10/20/2011 at 4.6. He began Revlimid on 11/11/2011. He takes weekly dexamethasone. He began cycle 2 of Revlimid on 12/09/2011. The serum M spike was improved on 12/31/2011. The serum M spike was slightly higher on 01/28/2012. He began cycle 4 on 02/03/2012. He developed recurrent hypercalcemia. Treatment was changed  to Cytoxan/Velcade/Decadron beginning 03/02/2012. The serum M spike was lower on 02/25/2012.  2. Chronic "dizziness." By his description of symptoms it is unclear if he is experiencing dizziness. He will followup with his primary physician. 3. Chest x-ray 11/01/2008 with a patchy opacity at the right midlung suspicious for pneumonia. He was treated with Avelox. 4. Low back pain with a radicular component. An MRI on 10/07/2008 showed no involvement of the lumbosacral spine with myeloma. 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 5. Bilateral neck pain, status post a CT 05/17/2008 with findings of spinal stenosis and osteoarthritis. 6. 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 Red cell microcytosis, likely related to hemoglobin C trait. A ferritin level returned elevated and a stool Hemoccult was negative 01/23/2010. He underwent a colonoscopy in 2009. 8. Elevated beta-2 microglobulin level. 9. History of hypertension. 10. History of a herniated disk at L4-L5 on MRI an 01/11/2002. 11. Hyperlipidemia. 12. Gastroesophageal reflux disease. 13. History of atypical angina. 14. History of rectal bleeding-large external hemorrhoids noted 02/25/2012. The stool was Hemoccult positive. He has a history of colon polyps. He is followed by [facility]. He was diagnosed with hemorrhoids in September of 2013 while hospitalized. He underwent a band procedure 03/09/2013. He has had no further rectal bleeding. 15. Superficial venous thrombosis of the greater saphenous vein on a Doppler 09/27/2008. Negative for deep vein thrombosis. Symptoms improved with aspirin. 16. Neck pain with numbness/tingling in the arms, hands, and low back with proximal right leg weakness. An MRI of the  cervical spine 05/30/2009 showed multilevel spondylosis with mild cord edema at C4-C5  and C5-C6. He was noted to have an enhancing lesion of the cord at T3-T4 of unclear etiology. In the lumbar spine there was no change in the spondylosis at L3-L4 and L4-L5. There was no involvement of the cervical or lumbar spine with myeloma. He is status post C4-C5, C5-C6, and C6-C7 anterior cervical diskectomy with fusion by Dr. Jordan Likes 08/01/2009. 17. History of mild thrombocytopenia. 18. Indeterminate-age deep vein thrombosis of the left lower extremity on a venous Doppler 08/15/2009. There were also findings consistent with superficial thrombosis involving the right lower extremity 08/15/2009. Coumadin was discontinued in October 2011. 19. Type 2 diabetes. 20. History of hematuria, followed at [facility]. Urinalysis was negative for blood on 07/23/2011. 21. Radicular back pain with MRI of the lumbar spine on 10/19/2011 showing nerve root impingement at L2-L3, L3-L4 and L4-L5 with the most severe at the right L3 nerve roots due to a large disc fragment. Associated right leg weakness. He underwent right L2-3 decompressive laminotomy with right L2 and L3 decompressive foraminotomy; right L2-3 microdiscectomy on 11/01/2011. 22. Constipation He continues a laxative regimen. 23. Hospitalization 11/08/2011 through 11/10/2011 with orthostatic hypotension/dizziness. He improved with intravenous hydration. He had mild hypotension when here on 11/18/2011. We recommended discontinuing Norvasc and Proscar. 24. Hypercalcemia status post pamidronate 11/03/2011. Recurrent hypercalcemia 02/16/2012 status post Zometa. The calcium has remained in normal range. 25. Fractured tooth with associated pain. Status post a tooth extraction by Dr. Kristin Bruins, Zometa was placed on hold. 26. Exertional dyspnea. Stable. He has been referred to cardiology. 27. 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 Stable.   Disposition:  [name] continue Cytoxan/Velcade/Decadron. The IgG  level and serum M spike were improved on 04/19/2013. He will continue the current treatment. [name] will return for an office visit in one month.  He appears to have a resolving ulcer at the right buccal mucosa. He will contact us for a recurrent mouth ulcer.   Thornton Papas, MD  05/17/2013  1:30 PM

## 2013-05-17 NOTE — Patient Instructions (Signed)
[facility] Discharge Instructions for Patients Receiving Chemotherapy  Today you received the following chemotherapy agents:  Velcade  To help prevent nausea and vomiting after your treatment, we encourage you to take your nausea medication as ordered per MD.   If you develop nausea and vomiting that is not controlled by your nausea medication, call the clinic.   BELOW ARE SYMPTOMS THAT SHOULD BE REPORTED IMMEDIATELY:  *FEVER GREATER THAN 100.5 F  *CHILLS WITH OR WITHOUT FEVER  NAUSEA AND VOMITING THAT IS NOT CONTROLLED WITH YOUR NAUSEA MEDICATION  *UNUSUAL SHORTNESS OF BREATH  *UNUSUAL BRUISING OR BLEEDING  TENDERNESS IN MOUTH AND THROAT WITH OR WITHOUT PRESENCE OF ULCERS  *URINARY PROBLEMS  *BOWEL PROBLEMS  UNUSUAL RASH Items with * indicate a potential emergency and should be followed up as soon as possible.  Feel free to call the clinic you have any questions or concerns. The clinic phone number is [phone].    

## 2013-05-17 NOTE — Telephone Encounter (Signed)
Per staff message and POF I have scheduled appts.  JMW  

## 2013-05-17 NOTE — Telephone Encounter (Signed)
Talked to pt and he has appt for lab, chemo and MD for November and December 2014

## 2013-05-20 ENCOUNTER — Other Ambulatory Visit: Payer: Self-pay | Admitting: Oncology

## 2013-05-21 ENCOUNTER — Encounter: Payer: Self-pay | Admitting: Cardiovascular Disease

## 2013-05-21 ENCOUNTER — Ambulatory Visit (INDEPENDENT_AMBULATORY_CARE_PROVIDER_SITE_OTHER): Payer: Medicare Other | Admitting: Cardiovascular Disease

## 2013-05-21 VITALS — BP 127/74 | HR 82 | Ht 72.0 in | Wt 262.0 lb

## 2013-05-21 DIAGNOSIS — J9811 Atelectasis: Secondary | ICD-10-CM

## 2013-05-21 DIAGNOSIS — R600 Localized edema: Secondary | ICD-10-CM

## 2013-05-21 DIAGNOSIS — I10 Essential (primary) hypertension: Secondary | ICD-10-CM

## 2013-05-21 DIAGNOSIS — R609 Edema, unspecified: Secondary | ICD-10-CM

## 2013-05-21 DIAGNOSIS — J9819 Other pulmonary collapse: Secondary | ICD-10-CM

## 2013-05-21 DIAGNOSIS — R0602 Shortness of breath: Secondary | ICD-10-CM

## 2013-05-21 LAB — PROTEIN ELECTROPHORESIS, SERUM
Albumin ELP: 60 % (ref 55.8–66.1)
Alpha-1-Globulin: 4.6 % (ref 2.9–4.9)
Alpha-2-Globulin: 11 % (ref 7.1–11.8)
Beta Globulin: 6.2 % (ref 4.7–7.2)
Gamma Globulin: 14.4 % (ref 11.1–18.8)
Total Protein, Serum Electrophoresis: 6 g/dL (ref 6.0–8.3)

## 2013-05-21 NOTE — Patient Instructions (Addendum)
Your physician recommends that you schedule a follow-up appointment in: 3 months with Dr. Purvis Sheffield. This appointment will be scheduled today before you leave.  Your physician recommends that you continue on your current medications as directed. Please refer to the Current Medication list given to you today.  Dr. Purvis Sheffield has order Compression stockings for you.  Your physician has requested that you have an echocardiogram. Echocardiography is a painless test that uses sound waves to create images of your heart. It provides your doctor with information about the size and shape of your heart and how well your heart's chambers and valves are working. This procedure takes approximately one hour. There are no restrictions for this procedure.  Your physician has recommended that you have a pulmonary function test. Pulmonary Function Tests are a group of tests that measure how well air moves in and out of your lungs.

## 2013-05-21 NOTE — Addendum Note (Signed)
Addended by: Burnice Logan on: 05/21/2013 10:05 AM   Modules accepted: Orders

## 2013-05-21 NOTE — Progress Notes (Signed)
Patient ID: [name] [name], male   DOB: 1936/01/13, 77 y.o.   MRN: [id number]      SUBJECTIVE: [name] [name] was referred here to Dr. Diona [name] on April 28, per [name] [name] request, for evaluation of progressive SOB and intermittent CP. He presented with no known history of CAD or CHF. He had had a negative adenosine Myoview in 2004. Dr. Diona [name] recommended proceeding with a complete noninvasive evaluation, with results as follows:  - Lexiscan Cardiolite, May 5: Low risk stress nuclear study. There is a fixed (looks worse at rest) medium-sized basal to mid inferior and inferoseptal perfusion defect. There is no evidence for ischemia. Suspect prior infarction. EF is preserved. LV Ejection Fraction: 58%. LV Wall Motion: Basal inferior hypokinesis  - Echocardiogram, May 9: LV function normal, without obvious regional wall motion abnormality; Pulmonary artery pressures mildly elevated, but no major valvular abnormalities   He then followed up with [name] Serpe PA-C in 11/2012, and had no changes in his symptoms and had been feeling well at that time. His DOE has been chronic, ongoing for many years.   He feels like his dyspnea is slightly worse, and feels slightly more fatigued when compared to May 2014. He follows with oncology for multiple myeloma which is deemed stable. His most recent Hgb was 12.5.  He occasionally feels some mild chest tightness with deep inspiration. He has sleep apnea and uses CPAP.  A chest xray on 9/29 showed mild bibasilar atelectasis or scarring.   Allergies  Allergen Reactions  . Penicillins Hives and Rash    Current Outpatient Prescriptions  Medication Sig Dispense Refill  . acyclovir (ZOVIRAX) 400 MG tablet TAKE 1 TABLET (400 MG TOTAL) BY MOUTH 2 (TWO) TIMES DAILY.  60 tablet  2  . amLODipine (NORVASC) 2.5 MG tablet Take 1 tablet (2.5 mg total) by mouth daily.  30 tablet  3  . aspirin 325 MG EC tablet Take 1 tablet (325 mg total) by mouth 2 (two) times daily. Do not take  again until March 20, 2013      . atorvastatin (LIPITOR) 20 MG tablet Take 1 tablet (20 mg total) by mouth daily.  30 tablet  3  . co-enzyme Q-10 50 MG capsule Take 4 capsules (200 mg total) by mouth daily.  120 capsule  11  . cyclophosphamide (CYTOXAN) 50 MG tablet Take 12 tablets (600 mg total) by mouth once a week. Give on an empty stomach 1 hour before or 2 hours after meals.  48 tablet  1  . dexamethasone (DECADRON) 4 MG tablet Take 5 tablets (20 mg total) by mouth once a week.  20 tablet  1  . fluticasone (FLONASE) 50 MCG/ACT nasal spray Place 2 sprays into the nose daily.  16 g  6  . lactulose (CHRONULAC) 10 GM/15ML solution Take 15 mLs (10 g total) by mouth 2 (two) times daily as needed.  500 mL  0  . NITROSTAT 0.4 MG SL tablet Place 0.4 mg under the tongue as needed.      Marland Kitchen omeprazole (PRILOSEC) 20 MG capsule TAKE 1 CAPSULE (20 MG TOTAL) BY MOUTH DAILY.  30 capsule  5  . polyethylene glycol (MIRALAX / GLYCOLAX) packet Take 17 g by mouth 2 (two) times daily.       . potassium chloride SA (KLOR-CON M20) 20 MEQ tablet TAKE 1 TABLET EVERY DAY  30 tablet  0  . PRESCRIPTION MEDICATION Inject as directed every 7 (seven) days. Velcade 3mg  injection q7d on Thursdays, skips  every 4th week.      . prochlorperazine (COMPAZINE) 10 MG tablet Take 10 mg by mouth every 6 (six) hours as needed (for nausea).      . tamsulosin (FLOMAX) 0.4 MG CAPS Take 1 capsule (0.4 mg total) by mouth 2 (two) times daily with a meal.  60 capsule  5  . zolpidem (AMBIEN) 5 MG tablet Take 5 mg by mouth at bedtime as needed.        No current facility-administered medications for this visit.    Past Medical History  Diagnosis Date  . GERD (gastroesophageal reflux disease)   . Essential hypertension, benign   . BPH (benign prostatic hyperplasia)   . Lumbar herniated disc     L4-5  . Hyperlipidemia   . Sleep apnea   . Multiple myeloma 02/19/2008  . Arthritis   . Diverticulosis   . Peptic ulcer disease   . Tubular  adenoma 02/16/2008    [name] [name]    Past Surgical History  Procedure Laterality Date  . Neck surgery    . Lumbar laminectomy/decompression microdiscectomy  11/01/2011    Procedure: LUMBAR LAMINECTOMY/DECOMPRESSION MICRODISCECTOMY 2 LEVELS;  Surgeon: [name] Pacini, MD;  Location: [facility];  Service: Neurosurgery;  Laterality: Right;  Lumbar Laminectomy/Microdiscectomy Decompression Lumbar Three-Four, Lumbar Four-Five   . Colonoscopy    . Hemorrhoid banding  2014    History   Social History  . Marital Status: Married    Spouse Name: N/A    Number of Children: 4  . Years of Education: N/A   Occupational History  . retired-disabled-Construction    Social History [name] Topics  . Smoking status: Former Smoker -- 2.00 packs/day for 20 years    Types: Cigarettes    Quit date: 07/12/1990  . Smokeless tobacco: Never Used  . Alcohol Use: No  . Drug Use: No  . Sexual Activity: Not on file   Other Topics Concern  . Not on file   Social History Narrative   Pt was adopted.   Married   Advice worker Vitals:   05/21/13 0859  BP: 127/74  Pulse: 82  Height: 6' (1.829 m)  Weight: 262 lb (118.842 kg)  SpO2: 94%    PHYSICAL EXAM General: NAD Neck: No JVD, no thyromegaly or thyroid nodule.  Lungs: bibasilar crackles with normal respiratory effort. CV: Nondisplaced PMI.  Heart regular S1/S2, no S3/S4, no murmur.  1+ pretibial edema.  No carotid bruit.  Normal pedal pulses.  Abdomen: Soft, nontender, no hepatosplenomegaly, no distention.  Neurologic: Alert and oriented x 3.  Psych: Normal affect. Extremities: No clubbing or cyanosis.   ECG: reviewed and available in electronic records.      ASSESSMENT AND PLAN: 1. Dyspnea on exertion: this may be due to cardiovascular deconditioning. Given the findings on stress testing earlier this year, I do not feel further stress testing warranted at this time. I will proceed with PFT's to evaluate for any potential  obstructive component. He has a h/o tobacco use 30 years ago. I will repeat an echocardiogram to assess for any interval changes, particularly in diastolic function, given his leg swelling. 2. Leg swelling: I will prescribe compression stockings. 3. HTN: controlled on current therapy.   Prentice Docker, M.D., F.A.C.C.

## 2013-05-25 NOTE — Telephone Encounter (Signed)
RECEIVED A FAX FROM [facility] CONCERNING A CONFIRMATION OF PRESCRIPTION SHIPMENT FOR CYCLOPHOSPHAMIDE ON 05/24/13.

## 2013-05-27 ENCOUNTER — Other Ambulatory Visit: Payer: Self-pay | Admitting: Oncology

## 2013-05-29 ENCOUNTER — Other Ambulatory Visit: Payer: Self-pay | Admitting: Family Medicine

## 2013-05-30 ENCOUNTER — Other Ambulatory Visit: Payer: Self-pay

## 2013-05-30 ENCOUNTER — Other Ambulatory Visit (INDEPENDENT_AMBULATORY_CARE_PROVIDER_SITE_OTHER): Payer: Medicare Other

## 2013-05-30 DIAGNOSIS — R609 Edema, unspecified: Secondary | ICD-10-CM

## 2013-05-30 DIAGNOSIS — I059 Rheumatic mitral valve disease, unspecified: Secondary | ICD-10-CM

## 2013-05-30 DIAGNOSIS — R0602 Shortness of breath: Secondary | ICD-10-CM

## 2013-05-30 DIAGNOSIS — R600 Localized edema: Secondary | ICD-10-CM

## 2013-05-31 ENCOUNTER — Ambulatory Visit (HOSPITAL_BASED_OUTPATIENT_CLINIC_OR_DEPARTMENT_OTHER): Payer: Medicare Other

## 2013-05-31 ENCOUNTER — Other Ambulatory Visit (HOSPITAL_BASED_OUTPATIENT_CLINIC_OR_DEPARTMENT_OTHER): Payer: Medicare Other | Admitting: Lab

## 2013-05-31 VITALS — BP 115/73 | HR 84 | Temp 97.1°F | Resp 18

## 2013-05-31 DIAGNOSIS — C9000 Multiple myeloma not having achieved remission: Secondary | ICD-10-CM

## 2013-05-31 DIAGNOSIS — Z5112 Encounter for antineoplastic immunotherapy: Secondary | ICD-10-CM

## 2013-05-31 LAB — CBC WITH DIFFERENTIAL/PLATELET
BASO%: 1 % (ref 0.0–2.0)
Basophils Absolute: 0 10*3/uL (ref 0.0–0.1)
EOS%: 1.5 % (ref 0.0–7.0)
HGB: 12.3 g/dL — ABNORMAL LOW (ref 13.0–17.1)
LYMPH%: 23.5 % (ref 14.0–49.0)
MCH: 28.2 pg (ref 27.2–33.4)
MCHC: 32.9 g/dL (ref 32.0–36.0)
MCV: 85.6 fL (ref 79.3–98.0)
MONO%: 17.8 % — ABNORMAL HIGH (ref 0.0–14.0)
Platelets: 157 10*3/uL (ref 140–400)
RDW: 16.2 % — ABNORMAL HIGH (ref 11.0–14.6)
lymph#: 1 10*3/uL (ref 0.9–3.3)

## 2013-05-31 MED ORDER — BORTEZOMIB CHEMO SQ INJECTION 3.5 MG (2.5MG/ML)
1.3000 mg/m2 | Freq: Once | INTRAMUSCULAR | Status: AC
  Administered 2013-05-31: 3 mg via SUBCUTANEOUS
  Filled 2013-05-31: qty 3

## 2013-05-31 MED ORDER — ONDANSETRON HCL 8 MG PO TABS
8.0000 mg | ORAL_TABLET | Freq: Once | ORAL | Status: AC
  Administered 2013-05-31: 8 mg via ORAL

## 2013-05-31 MED ORDER — DEXAMETHASONE 4 MG PO TABS
ORAL_TABLET | ORAL | Status: AC
  Filled 2013-05-31: qty 5

## 2013-05-31 MED ORDER — DEXAMETHASONE 4 MG PO TABS
20.0000 mg | ORAL_TABLET | Freq: Once | ORAL | Status: AC
  Administered 2013-05-31: 20 mg via ORAL

## 2013-05-31 MED ORDER — ONDANSETRON HCL 8 MG PO TABS
ORAL_TABLET | ORAL | Status: AC
  Filled 2013-05-31: qty 1

## 2013-05-31 NOTE — Patient Instructions (Signed)
[facility] Discharge Instructions for Patients Receiving Chemotherapy  Today you received the following chemotherapy agents: Velcade.  To help prevent nausea and vomiting after your treatment, we encourage you to take your nausea medication as prescribed.   If you develop nausea and vomiting that is not controlled by your nausea medication, call the clinic.   BELOW ARE SYMPTOMS THAT SHOULD BE REPORTED IMMEDIATELY:  *FEVER GREATER THAN 100.5 F  *CHILLS WITH OR WITHOUT FEVER  NAUSEA AND VOMITING THAT IS NOT CONTROLLED WITH YOUR NAUSEA MEDICATION  *UNUSUAL SHORTNESS OF BREATH  *UNUSUAL BRUISING OR BLEEDING  TENDERNESS IN MOUTH AND THROAT WITH OR WITHOUT PRESENCE OF ULCERS  *URINARY PROBLEMS  *BOWEL PROBLEMS  UNUSUAL RASH Items with * indicate a potential emergency and should be followed up as soon as possible.  Feel free to call the clinic you have any questions or concerns. The clinic phone number is [phone].    

## 2013-06-04 ENCOUNTER — Other Ambulatory Visit: Payer: Self-pay | Admitting: Family Medicine

## 2013-06-05 ENCOUNTER — Telehealth: Payer: Self-pay | Admitting: *Deleted

## 2013-06-05 ENCOUNTER — Encounter (HOSPITAL_COMMUNITY): Payer: Self-pay

## 2013-06-05 NOTE — Telephone Encounter (Signed)
Notes Recorded by Lesle Chris, LPN on 19/14/7829 at 10:09 AM Correction - wife Kaenan Jake) notified.  Notes Recorded by Lesle Chris, LPN on 56/21/3086 at 10:08 AM Daughter Angelique Blonder) notified. Copy of results forwarded to PMD (Dr. Modesto Charon). Has OV with him on 06/11/2013.

## 2013-06-05 NOTE — Telephone Encounter (Signed)
Message copied by Lesle Chris on Tue Jun 05, 2013 10:09 AM ------      Message from: Prentice Docker A      Created: Mon Jun 04, 2013  9:25 AM       Please forward results re: liver cyst to PCP. ------

## 2013-06-05 NOTE — Telephone Encounter (Signed)
Asking if he should take his cytoxan on 06/07/13 ? Called back and told her OK to take or he can hold it this week (per Dr. Truett Perna) if he prefers if he will feel better for the holiday. They have chosen to hold it this week and will return next week. She thought he was supposed to have treatment today and came in, because they were told to come in, but found out the call was for PFT at [facility] instead.

## 2013-06-10 ENCOUNTER — Other Ambulatory Visit: Payer: Self-pay | Admitting: Oncology

## 2013-06-11 ENCOUNTER — Encounter: Payer: Self-pay | Admitting: Family Medicine

## 2013-06-11 ENCOUNTER — Ambulatory Visit (INDEPENDENT_AMBULATORY_CARE_PROVIDER_SITE_OTHER): Payer: Medicare Other | Admitting: Family Medicine

## 2013-06-11 VITALS — BP 124/74 | HR 90 | Temp 97.3°F | Ht 72.0 in | Wt 262.6 lb

## 2013-06-11 DIAGNOSIS — E782 Mixed hyperlipidemia: Secondary | ICD-10-CM

## 2013-06-11 DIAGNOSIS — N289 Disorder of kidney and ureter, unspecified: Secondary | ICD-10-CM

## 2013-06-11 DIAGNOSIS — J309 Allergic rhinitis, unspecified: Secondary | ICD-10-CM

## 2013-06-11 DIAGNOSIS — G4753 Recurrent isolated sleep paralysis: Secondary | ICD-10-CM | POA: Insufficient documentation

## 2013-06-11 DIAGNOSIS — M5126 Other intervertebral disc displacement, lumbar region: Secondary | ICD-10-CM

## 2013-06-11 DIAGNOSIS — J302 Other seasonal allergic rhinitis: Secondary | ICD-10-CM

## 2013-06-11 DIAGNOSIS — C9000 Multiple myeloma not having achieved remission: Secondary | ICD-10-CM

## 2013-06-11 DIAGNOSIS — R0609 Other forms of dyspnea: Secondary | ICD-10-CM | POA: Insufficient documentation

## 2013-06-11 DIAGNOSIS — R0602 Shortness of breath: Secondary | ICD-10-CM

## 2013-06-11 DIAGNOSIS — I10 Essential (primary) hypertension: Secondary | ICD-10-CM

## 2013-06-11 DIAGNOSIS — M5116 Intervertebral disc disorders with radiculopathy, lumbar region: Secondary | ICD-10-CM

## 2013-06-11 DIAGNOSIS — G4733 Obstructive sleep apnea (adult) (pediatric): Secondary | ICD-10-CM

## 2013-06-11 DIAGNOSIS — G4700 Insomnia, unspecified: Secondary | ICD-10-CM

## 2013-06-11 MED ORDER — FLUTICASONE PROPIONATE 50 MCG/ACT NA SUSP: 2.0000 | Freq: Every day | NASAL | Status: DC

## 2013-06-11 NOTE — Progress Notes (Signed)
Patient ID: [name], male   DOB: 1935-12-15, 77 y.o.   MRN: [id number] SUBJECTIVE: CC: Chief Complaint  Patient presents with  . Follow-up    c/o cold symptoms no cough nasal stuffiness states he did see the cardiologist.     HPI: Patient is here for follow up of hypertension: denies Headache;deniesChest Pain;denies weakness;denies Shortness of Breath or Orthopnea;denies Visual changes;denies palpitations;denies cough;denies pedal edema;denies symptoms of TIA or stroke; admits to Compliance with medications. denies Problems with medications.   Had episode last night was part awake and part asleep and couldn't move. Was very tired. Had these when he was younger. As if paralyzed.  Past Medical History  Diagnosis Date  . GERD (gastroesophageal reflux disease)   . Essential hypertension, benign   . BPH (benign prostatic hyperplasia)   . Lumbar herniated disc     L4-5  . Hyperlipidemia   . Sleep apnea   . Multiple myeloma 02/19/2008  . Arthritis   . Diverticulosis   . Peptic ulcer disease   . Tubular adenoma 02/16/2008    Dr. Stan Head   Past Surgical History  Procedure Laterality Date  . Neck surgery    . Lumbar laminectomy/decompression microdiscectomy  11/01/2011    Procedure: LUMBAR LAMINECTOMY/DECOMPRESSION MICRODISCECTOMY 2 LEVELS;  Surgeon: Temple Pacini, MD;  Location: [facility];  Service: Neurosurgery;  Laterality: Right;  Lumbar Laminectomy/Microdiscectomy Decompression Lumbar Three-Four, Lumbar Four-Five   . Colonoscopy    . Hemorrhoid banding  2014   History   Social History  . Marital Status: Married    Spouse Name: N/A    Number of Children: 4  . Years of Education: N/A   Occupational History  . retired-disabled-Construction    Social History Main Topics  . Smoking status: Former Smoker -- 2.00 packs/day for 20 years    Types: Cigarettes    Quit date: 07/12/1990  . Smokeless tobacco: Never Used  . Alcohol Use: No  . Drug Use: No  . Sexual  Activity: Not on file   Other Topics Concern  . Not on file   Social History Narrative   Pt was adopted.   Married   Probation officer   Family History  Problem Relation Age of Onset  . Adopted: Yes   Current Outpatient Prescriptions on File Prior to Visit  Medication Sig Dispense Refill  . acyclovir (ZOVIRAX) 400 MG tablet TAKE 1 TABLET (400 MG TOTAL) BY MOUTH 2 (TWO) TIMES DAILY.  60 tablet  2  . amLODipine (NORVASC) 2.5 MG tablet Take 1 tablet (2.5 mg total) by mouth daily.  30 tablet  3  . aspirin 325 MG EC tablet Take 1 tablet (325 mg total) by mouth 2 (two) times daily. Do not take again until March 20, 2013      . atorvastatin (LIPITOR) 20 MG tablet TAKE 1 TABLET (20 MG TOTAL) BY MOUTH DAILY.  30 tablet  2  . co-enzyme Q-10 50 MG capsule Take 4 capsules (200 mg total) by mouth daily.  120 capsule  11  . cyclophosphamide (CYTOXAN) 50 MG tablet Take 12 tablets (600 mg total) by mouth once a week. Give on an empty stomach 1 hour before or 2 hours after meals.  48 tablet  1  . dexamethasone (DECADRON) 4 MG tablet Take 5 tablets (20 mg total) by mouth once a week.  20 tablet  1  . lactulose (CHRONULAC) 10 GM/15ML solution Take 15 mLs (10 g total) by mouth 2 (two) times daily as  needed.  500 mL  0  . NITROSTAT 0.4 MG SL tablet Place 0.4 mg under the tongue as needed.      Marland Kitchen omeprazole (PRILOSEC) 20 MG capsule TAKE 1 CAPSULE (20 MG TOTAL) BY MOUTH DAILY.  30 capsule  5  . polyethylene glycol (MIRALAX / GLYCOLAX) packet Take 17 g by mouth 2 (two) times daily.       . potassium chloride SA (KLOR-CON M20) 20 MEQ tablet TAKE 1 TABLET EVERY DAY  30 tablet  0  . PRESCRIPTION MEDICATION Inject as directed every 7 (seven) days. Velcade 3mg  injection q7d on Thursdays, skips every 4th week.      . prochlorperazine (COMPAZINE) 10 MG tablet Take 10 mg by mouth every 6 (six) hours as needed (for nausea).      . tamsulosin (FLOMAX) 0.4 MG CAPS capsule TAKE 1 CAPSULE (0.4 MG TOTAL) BY MOUTH 2 (TWO)  TIMES DAILY WITH A MEAL.  60 capsule  5  . zolpidem (AMBIEN) 5 MG tablet Take 5 mg by mouth at bedtime as needed.        No current facility-administered medications on file prior to visit.   Allergies  Allergen Reactions  . Penicillins Hives and Rash   Immunization History  Administered Date(s) Administered  . Influenza Split 04/23/2011, 04/11/2012  . Influenza,inj,Quad PF,36+ Mos 04/09/2013  . Tdap 07/13/2007   Prior to Admission medications   Medication Sig Start Date End Date Taking? Authorizing Provider  acyclovir (ZOVIRAX) 400 MG tablet TAKE 1 TABLET (400 MG TOTAL) BY MOUTH 2 (TWO) TIMES DAILY. 02/13/13   Ladene Artist, MD  amLODipine (NORVASC) 2.5 MG tablet Take 1 tablet (2.5 mg total) by mouth daily. 05/11/13   Ileana Ladd, MD  aspirin 325 MG EC tablet Take 1 tablet (325 mg total) by mouth 2 (two) times daily. Do not take again until March 20, 2013 03/06/13   Iva Boop, MD  atorvastatin (LIPITOR) 20 MG tablet TAKE 1 TABLET (20 MG TOTAL) BY MOUTH DAILY. 05/29/13   Ileana Ladd, MD  co-enzyme Q-10 50 MG capsule Take 4 capsules (200 mg total) by mouth daily. 01/31/13   Ileana Ladd, MD  cyclophosphamide (CYTOXAN) 50 MG tablet Take 12 tablets (600 mg total) by mouth once a week. Give on an empty stomach 1 hour before or 2 hours after meals. 05/17/13   Ladene Artist, MD  dexamethasone (DECADRON) 4 MG tablet Take 5 tablets (20 mg total) by mouth once a week. 05/17/13   Ladene Artist, MD  fluticasone (FLONASE) 50 MCG/ACT nasal spray Place 2 sprays into the nose daily. 03/23/13   Ileana Ladd, MD  lactulose (CHRONULAC) 10 GM/15ML solution Take 15 mLs (10 g total) by mouth 2 (two) times daily as needed. 05/10/13   Ladene Artist, MD  NITROSTAT 0.4 MG SL tablet Place 0.4 mg under the tongue as needed. 12/07/12   Historical Provider, MD  omeprazole (PRILOSEC) 20 MG capsule TAKE 1 CAPSULE (20 MG TOTAL) BY MOUTH DAILY. 03/24/13   Ileana Ladd, MD  polyethylene glycol (MIRALAX  / Ethelene Hal) packet Take 17 g by mouth 2 (two) times daily.     Historical Provider, MD  potassium chloride SA (KLOR-CON M20) 20 MEQ tablet TAKE 1 TABLET EVERY DAY 02/13/13   Ladene Artist, MD  PRESCRIPTION MEDICATION Inject as directed every 7 (seven) days. Velcade 3mg  injection q7d on Thursdays, skips every 4th week.    Historical Provider, MD  prochlorperazine (COMPAZINE)  10 MG tablet Take 10 mg by mouth every 6 (six) hours as needed (for nausea).    Historical Provider, MD  tamsulosin (FLOMAX) 0.4 MG CAPS capsule TAKE 1 CAPSULE (0.4 MG TOTAL) BY MOUTH 2 (TWO) TIMES DAILY WITH A MEAL. 06/04/13   Ileana Ladd, MD  zolpidem (AMBIEN) 5 MG tablet Take 5 mg by mouth at bedtime as needed.  04/05/13   Historical Provider, MD     ROS: As above in the HPI. All other systems are stable or negative.  OBJECTIVE: APPEARANCE:  Patient in no acute distress.The patient appeared well nourished and normally developed. Acyanotic. Waist: VITAL SIGNS:BP 124/74  Pulse 90  Temp(Src) 97.3 F (36.3 C) (Oral)  Ht 6' (1.829 m)  Wt 262 lb 9.6 oz (119.115 kg)  BMI 35.61 kg/m2 AAM obese  SKIN: warm and  Dry without overt rashes, tattoos and scars  HEAD and Neck: without JVD, Head and scalp: normal Eyes:No scleral icterus. Fundi normal, eye movements normal. Ears: Auricle normal, canal normal, Tympanic membranes normal, insufflation normal. Nose: nasal congestion Throat: normal Neck & thyroid: normal  CHEST & LUNGS: Chest wall: normal Lungs: Clear  CVS: Reveals the PMI to be normally located. Regular rhythm, First and Second Heart sounds are normal,  absence of murmurs, rubs or gallops. Peripheral vasculature: Radial pulses: normal Dorsal pedis pulses: normal Posterior pulses: normal  ABDOMEN:  Appearance: Obese Benign, no organomegaly, no masses, no Abdominal Aortic enlargement. No Guarding , no rebound. No Bruits. Bowel sounds: normal  RECTAL: N/A GU: N/A  EXTREMETIES:  nonedematous.   NEUROLOGIC: oriented to time,place and person; ambulates with a cane  Results for orders placed in visit on 05/31/13  CBC WITH DIFFERENTIAL      Result Value Range   WBC 4.3  4.0 - 10.3 10e3/uL   NEUT# 2.4  1.5 - 6.5 10e3/uL   HGB 12.3 (*) 13.0 - 17.1 g/dL   HCT 95.2 (*) 84.1 - 32.4 %   Platelets 157  140 - 400 10e3/uL   MCV 85.6  79.3 - 98.0 fL   MCH 28.2  27.2 - 33.4 pg   MCHC 32.9  32.0 - 36.0 g/dL   RBC 4.01  0.27 - 2.53 10e6/uL   RDW 16.2 (*) 11.0 - 14.6 %   lymph# 1.0  0.9 - 3.3 10e3/uL   MONO# 0.8  0.1 - 0.9 10e3/uL   Eosinophils Absolute 0.1  0.0 - 0.5 10e3/uL   Basophils Absolute 0.0  0.0 - 0.1 10e3/uL   NEUT% 56.2  39.0 - 75.0 %   LYMPH% 23.5  14.0 - 49.0 %   MONO% 17.8 (*) 0.0 - 14.0 %   EOS% 1.5  0.0 - 7.0 %   BASO% 1.0  0.0 - 2.0 %    ASSESSMENT:  Essential hypertension, benign - at goal  Multiple myeloma  Lumbar disc herniation with radiculopathy  Obstructive sleep apnea  Mixed hyperlipidemia  Renal insufficiency  Insomnia with sleep apnea  Seasonal allergic rhinitis - Plan: fluticasone (FLONASE) 50 MCG/ACT nasal spray  Shortness of breath  Recurrent isolated sleep paralysis   Reviewed Cardiology assessment. await the PFTs.  PLAN:  Discussed sleep paralysis.  Observe for now.  Continue with CPAP  No orders of the defined types were placed in this encounter.   Meds ordered this encounter  Medications  . fluticasone (FLONASE) 50 MCG/ACT nasal spray    Sig: Place 2 sprays into both nostrils daily.    Dispense:  16 g  Refill:  6   Medications Discontinued During This Encounter  Medication Reason  . fluticasone (FLONASE) 50 MCG/ACT nasal spray Reorder   Return in about 2 months (around 08/12/2013) for Recheck medical problems.  Krislynn Gronau P. [place]esto Charon, M.D.

## 2013-06-14 ENCOUNTER — Ambulatory Visit (HOSPITAL_BASED_OUTPATIENT_CLINIC_OR_DEPARTMENT_OTHER): Payer: Medicare Other

## 2013-06-14 ENCOUNTER — Other Ambulatory Visit (HOSPITAL_BASED_OUTPATIENT_CLINIC_OR_DEPARTMENT_OTHER): Payer: Medicare Other | Admitting: Lab

## 2013-06-14 ENCOUNTER — Other Ambulatory Visit: Payer: Self-pay | Admitting: Oncology

## 2013-06-14 VITALS — BP 144/73 | HR 88 | Temp 97.7°F | Resp 20

## 2013-06-14 DIAGNOSIS — C9000 Multiple myeloma not having achieved remission: Secondary | ICD-10-CM

## 2013-06-14 DIAGNOSIS — Z5112 Encounter for antineoplastic immunotherapy: Secondary | ICD-10-CM

## 2013-06-14 LAB — CBC WITH DIFFERENTIAL/PLATELET
BASO%: 0.5 % (ref 0.0–2.0)
LYMPH%: 13.5 % — ABNORMAL LOW (ref 14.0–49.0)
MCHC: 34 g/dL (ref 32.0–36.0)
MCV: 88 fL (ref 79.3–98.0)
MONO#: 0.8 10*3/uL (ref 0.1–0.9)
RBC: 4.24 10*6/uL (ref 4.20–5.82)
RDW: 16.6 % — ABNORMAL HIGH (ref 11.0–14.6)
WBC: 5.6 10*3/uL (ref 4.0–10.3)
lymph#: 0.7 10*3/uL — ABNORMAL LOW (ref 0.9–3.3)

## 2013-06-14 MED ORDER — DEXAMETHASONE 4 MG PO TABS
ORAL_TABLET | ORAL | Status: AC
  Filled 2013-06-14: qty 5

## 2013-06-14 MED ORDER — ONDANSETRON HCL 8 MG PO TABS
ORAL_TABLET | ORAL | Status: AC
  Filled 2013-06-14: qty 1

## 2013-06-14 MED ORDER — BORTEZOMIB CHEMO SQ INJECTION 3.5 MG (2.5MG/ML)
1.3000 mg/m2 | Freq: Once | INTRAMUSCULAR | Status: AC
  Administered 2013-06-14: 3 mg via SUBCUTANEOUS
  Filled 2013-06-14: qty 3

## 2013-06-14 MED ORDER — DEXAMETHASONE 4 MG PO TABS
20.0000 mg | ORAL_TABLET | Freq: Once | ORAL | Status: AC
  Administered 2013-06-14: 20 mg via ORAL

## 2013-06-14 MED ORDER — ONDANSETRON HCL 8 MG PO TABS
8.0000 mg | ORAL_TABLET | Freq: Once | ORAL | Status: AC
  Administered 2013-06-14: 8 mg via ORAL

## 2013-06-14 NOTE — Patient Instructions (Signed)
[facility] Discharge Instructions for Patients Receiving Chemotherapy  Today you received the following chemotherapy agents: Velcade. To help prevent nausea and vomiting after your treatment, we encourage you to take your nausea medication.  If you develop nausea and vomiting that is not controlled by your nausea medication, call the clinic.   BELOW ARE SYMPTOMS THAT SHOULD BE REPORTED IMMEDIATELY:  *FEVER GREATER THAN 100.5 F  *CHILLS WITH OR WITHOUT FEVER  NAUSEA AND VOMITING THAT IS NOT CONTROLLED WITH YOUR NAUSEA MEDICATION  *UNUSUAL SHORTNESS OF BREATH  *UNUSUAL BRUISING OR BLEEDING  TENDERNESS IN MOUTH AND THROAT WITH OR WITHOUT PRESENCE OF ULCERS  *URINARY PROBLEMS  *BOWEL PROBLEMS  UNUSUAL RASH Items with * indicate a potential emergency and should be followed up as soon as possible.  Feel free to call the clinic you have any questions or concerns. The clinic phone number is [phone].    

## 2013-06-15 ENCOUNTER — Ambulatory Visit (HOSPITAL_COMMUNITY)
Admission: RE | Admit: 2013-06-15 | Discharge: 2013-06-15 | Disposition: A | Discharge status: Home / Self Care | Payer: Medicare Other | Source: Ambulatory Visit | Attending: Cardiovascular Disease | Admitting: Cardiovascular Disease

## 2013-06-15 DIAGNOSIS — J9811 Atelectasis: Secondary | ICD-10-CM

## 2013-06-15 DIAGNOSIS — R0602 Shortness of breath: Secondary | ICD-10-CM | POA: Insufficient documentation

## 2013-06-15 LAB — BLOOD GAS, ARTERIAL
Acid-Base Excess: 0.5 mmol/L (ref 0.0–2.0)
Bicarbonate: 24.1 mEq/L — ABNORMAL HIGH (ref 20.0–24.0)
O2 Saturation: 94.7 %
Patient temperature: 37
TCO2: 21.3 mmol/L (ref 0–100)
pCO2 arterial: 35.7 mmHg (ref 35.0–45.0)

## 2013-06-15 MED ORDER — ALBUTEROL SULFATE (5 MG/ML) 0.5% IN NEBU
2.5000 mg | INHALATION_SOLUTION | Freq: Once | RESPIRATORY_TRACT | Status: AC
  Administered 2013-06-15: 2.5 mg via RESPIRATORY_TRACT

## 2013-06-16 ENCOUNTER — Other Ambulatory Visit: Payer: Self-pay | Admitting: Oncology

## 2013-06-18 ENCOUNTER — Other Ambulatory Visit: Payer: Self-pay | Admitting: Family Medicine

## 2013-06-18 NOTE — Procedures (Signed)
[name], [name]                 ACCOUNT NO.:  [id number]  MEDICAL RECORD NO.:  [id number]  LOCATION:                                 FACILITY:  PHYSICIAN:  Omnia Dollinger L. Juanetta Gosling, M.D.DATE OF BIRTH:  15-Aug-1935  DATE OF PROCEDURE:  06/16/2013 DATE OF DISCHARGE:                           PULMONARY FUNCTION TEST   Reason for pulmonary function testing is shortness of breath. 1. Spirometry shows a mild ventilatory defect with airflow obstruction     most marked in the smaller airways. 2. Lung volumes are normal. 3. DLCO is severely reduced, but corrects somewhat when volume is     taken into account. 4. Airway resistance is normal. 5. There is improvement with inhaled bronchodilator, but it does not     reach the level of significance. 6. Arterial blood gas is essentially normal. 7. There is evidence of airflow obstruction, and based on the     patient's previous smoking history, may be related to COPD.  DLCO     is reduced and this may also contribute to shortness of breath.     Gracey Tolle L. Juanetta Gosling, M.D.     ELH/MEDQ  D:  06/16/2013  T:  06/16/2013  Job:  [id number]  cc:   [facility] Cardiology Office Dr. Purvis Sheffield

## 2013-06-21 ENCOUNTER — Ambulatory Visit (HOSPITAL_BASED_OUTPATIENT_CLINIC_OR_DEPARTMENT_OTHER): Payer: Medicare Other | Admitting: Nurse Practitioner

## 2013-06-21 ENCOUNTER — Telehealth: Payer: Self-pay | Admitting: *Deleted

## 2013-06-21 ENCOUNTER — Telehealth: Payer: Self-pay | Admitting: Oncology

## 2013-06-21 ENCOUNTER — Other Ambulatory Visit (HOSPITAL_BASED_OUTPATIENT_CLINIC_OR_DEPARTMENT_OTHER): Payer: Medicare Other

## 2013-06-21 ENCOUNTER — Ambulatory Visit (HOSPITAL_BASED_OUTPATIENT_CLINIC_OR_DEPARTMENT_OTHER): Payer: Medicare Other

## 2013-06-21 VITALS — BP 137/76 | HR 102 | Temp 97.8°F | Resp 20 | Ht 72.0 in | Wt 267.3 lb

## 2013-06-21 DIAGNOSIS — R42 Dizziness and giddiness: Secondary | ICD-10-CM

## 2013-06-21 DIAGNOSIS — C9000 Multiple myeloma not having achieved remission: Secondary | ICD-10-CM

## 2013-06-21 DIAGNOSIS — Z5112 Encounter for antineoplastic immunotherapy: Secondary | ICD-10-CM

## 2013-06-21 DIAGNOSIS — E119 Type 2 diabetes mellitus without complications: Secondary | ICD-10-CM

## 2013-06-21 DIAGNOSIS — K5900 Constipation, unspecified: Secondary | ICD-10-CM

## 2013-06-21 LAB — BASIC METABOLIC PANEL (CC13)
Anion Gap: 11 mEq/L (ref 3–11)
BUN: 8.8 mg/dL (ref 7.0–26.0)
Calcium: 9.1 mg/dL (ref 8.4–10.4)
Chloride: 107 mEq/L (ref 98–109)
Creatinine: 0.8 mg/dL (ref 0.7–1.3)
Potassium: 3.9 mEq/L (ref 3.5–5.1)

## 2013-06-21 LAB — CBC WITH DIFFERENTIAL/PLATELET
BASO%: 0.7 % (ref 0.0–2.0)
Basophils Absolute: 0 10*3/uL (ref 0.0–0.1)
EOS%: 2.6 % (ref 0.0–7.0)
HGB: 12.5 g/dL — ABNORMAL LOW (ref 13.0–17.1)
LYMPH%: 17.6 % (ref 14.0–49.0)
MCH: 29.5 pg (ref 27.2–33.4)
MCHC: 34.2 g/dL (ref 32.0–36.0)
MCV: 86.2 fL (ref 79.3–98.0)
MONO%: 14 % (ref 0.0–14.0)
RDW: 16.9 % — ABNORMAL HIGH (ref 11.0–14.6)
lymph#: 0.8 10*3/uL — ABNORMAL LOW (ref 0.9–3.3)

## 2013-06-21 MED ORDER — ONDANSETRON HCL 8 MG PO TABS
8.0000 mg | ORAL_TABLET | Freq: Once | ORAL | Status: AC
  Administered 2013-06-21: 8 mg via ORAL

## 2013-06-21 MED ORDER — BORTEZOMIB CHEMO SQ INJECTION 3.5 MG (2.5MG/ML)
1.3000 mg/m2 | Freq: Once | INTRAMUSCULAR | Status: AC
  Administered 2013-06-21: 3 mg via SUBCUTANEOUS
  Filled 2013-06-21: qty 3

## 2013-06-21 MED ORDER — DEXAMETHASONE 4 MG PO TABS
20.0000 mg | ORAL_TABLET | Freq: Once | ORAL | Status: AC
  Administered 2013-06-21: 20 mg via ORAL

## 2013-06-21 MED ORDER — ONDANSETRON HCL 8 MG PO TABS
ORAL_TABLET | ORAL | Status: AC
  Filled 2013-06-21: qty 1

## 2013-06-21 MED ORDER — DEXAMETHASONE 4 MG PO TABS
ORAL_TABLET | ORAL | Status: AC
  Filled 2013-06-21: qty 5

## 2013-06-21 NOTE — Telephone Encounter (Signed)
Per staff message and POF I have scheduled appts.  JMW  

## 2013-06-21 NOTE — Telephone Encounter (Signed)
Gave pt appt for lab,md and chemo for December and January 2015 °

## 2013-06-21 NOTE — Progress Notes (Signed)
OFFICE PROGRESS NOTE  Interval history:  [name] returns for followup of multiple myeloma. He continues weekly Cytoxan/Decadron and Velcade on a three out of four week schedule. He denies nausea/vomiting. He continues to note soreness at the right side of the mouth. He has stable dyspnea on exertion. Stable neuropathy symptoms. He notes periodic indigestion. The indigestion is better if he eats at regular intervals.   Objective: Blood pressure 137/76, pulse 102, temperature 97.8 F (36.6 C), temperature source Oral, resp. rate 20, height 6' (1.829 m), weight 267 lb 4.8 oz (121.246 kg).  No definite thrush or ulcerations. Mucous membranes are moist. Faint rhonchi at the right lung base. Regular cardiac rhythm. Abdomen soft. No organomegaly. 1+ pitting edema at the lower leg/ankles bilaterally.  Lab Results: Lab Results  Component Value Date   WBC 4.7 06/21/2013   HGB 12.5* 06/21/2013   HCT 36.4* 06/21/2013   MCV 86.2 06/21/2013   PLT 130* 06/21/2013    Chemistry:    Chemistry      Component Value Date/Time   NA 141 06/21/2013 1312   NA 136 02/10/2013 1928   K 3.9 06/21/2013 1312   K 3.6 02/10/2013 1928   CL 100 02/10/2013 1928   CL 106 12/28/2012 1059   CO2 23 06/21/2013 1312   CO2 26 02/10/2013 1928   BUN 8.8 06/21/2013 1312   BUN 18 02/10/2013 1928   CREATININE 0.8 06/21/2013 1312   CREATININE 0.82 02/10/2013 1928   CREATININE 0.89 01/31/2013 1217      Component Value Date/Time   CALCIUM 9.1 06/21/2013 1312   CALCIUM 8.5 02/10/2013 1928   ALKPHOS 46 05/17/2013 1150   ALKPHOS 54 02/10/2013 1928   AST 21 05/17/2013 1150   AST 19 02/10/2013 1928   ALT 9 05/17/2013 1150   ALT 14 02/10/2013 1928   BILITOT 0.68 05/17/2013 1150   BILITOT 0.2* 02/10/2013 1928     05/17/2013 serum M spike 0.41 (0.42 on 04/19/2013 ).  Studies/Results: No results found.  Medications: I have reviewed the patient's current medications.  Assessment/Plan:  1. Multiple myeloma, status post treatment with  melphalan/prednisone/thalidomide beginning in November 2009. The thalidomide was increased to 200 mg daily beginning 09/11/2008. The serum M spike was slightly improved on 03/18/2009 and slightly increased on 05/09/2009. He has been maintained off of specific therapy for multiple myeloma since September 2010. The serum M spike was increased on 10/20/2011 at 4.6. He began Revlimid on 11/11/2011. He takes weekly dexamethasone. He began cycle 2 of Revlimid on 12/09/2011. The serum M spike was improved on 12/31/2011. The serum M spike was slightly higher on 01/28/2012. He began cycle 4 on 02/03/2012. He developed recurrent hypercalcemia. Treatment was changed to Cytoxan/Velcade/Decadron beginning 03/02/2012. The serum M spike was lower on 02/25/2012. 2. Chronic "dizziness."  3. Chest x-ray 11/01/2008 with a patchy opacity at the right midlung suspicious for pneumonia. He was treated with Avelox. 4. Low back pain with a radicular component. An MRI on 10/07/2008 showed no involvement of the lumbosacral spine with myeloma. 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 5. Bilateral neck pain, status post a CT 05/17/2008 with findings of spinal stenosis and osteoarthritis. 6. 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 Red cell microcytosis, likely related to hemoglobin C trait. A ferritin level returned elevated and a stool Hemoccult was negative 01/23/2010. He underwent a colonoscopy in 2009. 8. Elevated beta-2 microglobulin  level. 9. History of hypertension. 10. History of a herniated disk at L4-L5 on MRI an 01/11/2002. 11. Hyperlipidemia. 12. Gastroesophageal reflux disease. 13. History of atypical angina. 14. History of rectal bleeding-large external hemorrhoids noted 02/25/2012. The stool was Hemoccult positive. He has a history of colon polyps. He is  followed by [facility]. He was diagnosed with hemorrhoids in September of 2013 while hospitalized. He underwent a band procedure 03/09/2013. He has had no further rectal bleeding. 15. Superficial venous thrombosis of the greater saphenous vein on a Doppler 09/27/2008. Negative for deep vein thrombosis. Symptoms improved with aspirin. 16. Neck pain with numbness/tingling in the arms, hands, and low back with proximal right leg weakness. An MRI of the cervical spine 05/30/2009 showed multilevel spondylosis with mild cord edema at C4-C5 and C5-C6. He was noted to have an enhancing lesion of the cord at T3-T4 of unclear etiology. In the lumbar spine there was no change in the spondylosis at L3-L4 and L4-L5. There was no involvement of the cervical or lumbar spine with myeloma. He is status post C4-C5, C5-C6, and C6-C7 anterior cervical diskectomy with fusion by Dr. Jordan Likes 08/01/2009. 17. History of mild thrombocytopenia. 18. Indeterminate-age deep vein thrombosis of the left lower extremity on a venous Doppler 08/15/2009. There were also findings consistent with superficial thrombosis involving the right lower extremity 08/15/2009. Coumadin was discontinued in October 2011. 19. Type 2 diabetes. 20. History of hematuria, followed at [facility]. Urinalysis was negative for blood on 07/23/2011. 21. Radicular back pain with MRI of the lumbar spine on 10/19/2011 showing nerve root impingement at L2-L3, L3-L4 and L4-L5 with the most severe at the right L3 nerve roots due to a large disc fragment. Associated right leg weakness. He underwent right L2-3 decompressive laminotomy with right L2 and L3 decompressive foraminotomy; right L2-3 microdiscectomy on 11/01/2011. 22. Constipation He continues a laxative regimen. 23. Hospitalization 11/08/2011 through 11/10/2011 with orthostatic hypotension/dizziness. He improved with intravenous hydration. He had mild hypotension when here on 11/18/2011. We recommended  discontinuing Norvasc and Proscar. 24. Hypercalcemia status post pamidronate 11/03/2011. Recurrent hypercalcemia 02/16/2012 status post Zometa. The calcium has remained in normal range. 25. Fractured tooth with associated pain. Status post a tooth extraction by Dr. Kristin Bruins, Zometa was placed on hold. 26. Exertional dyspnea. Stable. He has been referred to cardiology. 27. 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 Stable.  Disposition-he will continue Cytoxan/Velcade/Decadron on the current schedule. We will followup on the IgG level and serum M spike from today. He will return for a followup visit in one month. He will contact the office in the interim with any problems.  Plan reviewed with Dr. Truett Perna.  Lonna Cobb ANP/GNP-BC

## 2013-06-21 NOTE — Patient Instructions (Signed)
[facility] Discharge Instructions for Patients Receiving Chemotherapy  Today you received the following chemotherapy agents Velcade  To help prevent nausea and vomiting after your treatment, we encourage you to take your nausea medication Compazine 10 mg as ordered by dr. Truett Perna.   If you develop nausea and vomiting that is not controlled by your nausea medication, call the clinic.   BELOW ARE SYMPTOMS THAT SHOULD BE REPORTED IMMEDIATELY:  *FEVER GREATER THAN 100.5 F  *CHILLS WITH OR WITHOUT FEVER  NAUSEA AND VOMITING THAT IS NOT CONTROLLED WITH YOUR NAUSEA MEDICATION  *UNUSUAL SHORTNESS OF BREATH  *UNUSUAL BRUISING OR BLEEDING  TENDERNESS IN MOUTH AND THROAT WITH OR WITHOUT PRESENCE OF ULCERS  *URINARY PROBLEMS  *BOWEL PROBLEMS  UNUSUAL RASH Items with * indicate a potential emergency and should be followed up as soon as possible.  Feel free to call the clinic you have any questions or concerns. The clinic phone number is [phone].

## 2013-06-21 NOTE — Progress Notes (Signed)
Discharged with wife.  Ambulatory with cane in no distress.

## 2013-06-24 ENCOUNTER — Other Ambulatory Visit: Payer: Self-pay | Admitting: Oncology

## 2013-06-25 LAB — PROTEIN ELECTROPHORESIS, SERUM
Alpha-1-Globulin: 4.6 % (ref 2.9–4.9)
Gamma Globulin: 13.9 % (ref 11.1–18.8)
M-Spike, %: 0.48 g/dL
Total Protein, Serum Electrophoresis: 6.3 g/dL (ref 6.0–8.3)

## 2013-06-25 LAB — IGG: IgG (Immunoglobin G), Serum: 853 mg/dL (ref 650–1600)

## 2013-06-26 ENCOUNTER — Other Ambulatory Visit: Payer: Self-pay | Admitting: *Deleted

## 2013-06-26 DIAGNOSIS — C9000 Multiple myeloma not having achieved remission: Secondary | ICD-10-CM

## 2013-06-26 MED ORDER — POTASSIUM CHLORIDE CRYS ER 20 MEQ PO TBCR: 20.0000 meq | EXTENDED_RELEASE_TABLET | Freq: Every day | ORAL | Status: DC

## 2013-06-27 ENCOUNTER — Encounter: Payer: Self-pay | Admitting: *Deleted

## 2013-06-27 NOTE — Progress Notes (Signed)
Faxed notification from [employer] that Cytoxan was shipped 06/26/13 for next day delivery.

## 2013-06-28 ENCOUNTER — Ambulatory Visit (HOSPITAL_BASED_OUTPATIENT_CLINIC_OR_DEPARTMENT_OTHER): Payer: Medicare Other

## 2013-06-28 ENCOUNTER — Other Ambulatory Visit (HOSPITAL_BASED_OUTPATIENT_CLINIC_OR_DEPARTMENT_OTHER): Payer: Medicare Other

## 2013-06-28 VITALS — BP 133/78 | HR 81 | Temp 97.1°F | Resp 19

## 2013-06-28 DIAGNOSIS — Z5112 Encounter for antineoplastic immunotherapy: Secondary | ICD-10-CM

## 2013-06-28 DIAGNOSIS — C9000 Multiple myeloma not having achieved remission: Secondary | ICD-10-CM

## 2013-06-28 LAB — CBC WITH DIFFERENTIAL/PLATELET
BASO%: 0.7 % (ref 0.0–2.0)
Basophils Absolute: 0 10*3/uL (ref 0.0–0.1)
EOS%: 2.8 % (ref 0.0–7.0)
LYMPH%: 20.5 % (ref 14.0–49.0)
MCH: 29.9 pg (ref 27.2–33.4)
MCHC: 34.4 g/dL (ref 32.0–36.0)
MONO#: 0.9 10*3/uL (ref 0.1–0.9)
MONO%: 17.8 % — ABNORMAL HIGH (ref 0.0–14.0)
NEUT%: 58.2 % (ref 39.0–75.0)
Platelets: 121 10*3/uL — ABNORMAL LOW (ref 140–400)
RBC: 4.44 10*6/uL (ref 4.20–5.82)
WBC: 5 10*3/uL (ref 4.0–10.3)
lymph#: 1 10*3/uL (ref 0.9–3.3)

## 2013-06-28 MED ORDER — DEXAMETHASONE 4 MG PO TABS
ORAL_TABLET | ORAL | Status: AC
  Filled 2013-06-28: qty 5

## 2013-06-28 MED ORDER — ONDANSETRON HCL 8 MG PO TABS
8.0000 mg | ORAL_TABLET | Freq: Once | ORAL | Status: AC
  Administered 2013-06-28: 8 mg via ORAL

## 2013-06-28 MED ORDER — DEXAMETHASONE 4 MG PO TABS
20.0000 mg | ORAL_TABLET | Freq: Once | ORAL | Status: AC
  Administered 2013-06-28: 20 mg via ORAL

## 2013-06-28 MED ORDER — BORTEZOMIB CHEMO SQ INJECTION 3.5 MG (2.5MG/ML)
1.3000 mg/m2 | Freq: Once | INTRAMUSCULAR | Status: AC
  Administered 2013-06-28: 3 mg via SUBCUTANEOUS
  Filled 2013-06-28: qty 3

## 2013-06-28 MED ORDER — ONDANSETRON HCL 8 MG PO TABS
ORAL_TABLET | ORAL | Status: AC
  Filled 2013-06-28: qty 1

## 2013-06-28 NOTE — Patient Instructions (Signed)
[facility] Discharge Instructions for Patients Receiving Chemotherapy  Today you received the following chemotherapy agents: Vecade  To help prevent nausea and vomiting after your treatment, we encourage you to take your Compazine 10 mg every 6 hrs as needed.   If you develop nausea and vomiting that is not controlled by your nausea medication, call the clinic.   BELOW ARE SYMPTOMS THAT SHOULD BE REPORTED IMMEDIATELY:  *FEVER GREATER THAN 100.5 F  *CHILLS WITH OR WITHOUT FEVER  NAUSEA AND VOMITING THAT IS NOT CONTROLLED WITH YOUR NAUSEA MEDICATION  *UNUSUAL SHORTNESS OF BREATH  *UNUSUAL BRUISING OR BLEEDING  TENDERNESS IN MOUTH AND THROAT WITH OR WITHOUT PRESENCE OF ULCERS  *URINARY PROBLEMS  *BOWEL PROBLEMS  UNUSUAL RASH Items with * indicate a potential emergency and should be followed up as soon as possible.  Feel free to call the clinic you have any questions or concerns. The clinic phone number is [phone].

## 2013-07-08 ENCOUNTER — Other Ambulatory Visit: Payer: Self-pay | Admitting: Oncology

## 2013-07-09 ENCOUNTER — Telehealth: Payer: Self-pay | Admitting: Family Medicine

## 2013-07-09 ENCOUNTER — Telehealth: Payer: Self-pay | Admitting: *Deleted

## 2013-07-09 ENCOUNTER — Encounter: Payer: Self-pay | Admitting: General Practice

## 2013-07-09 ENCOUNTER — Other Ambulatory Visit: Payer: Self-pay | Admitting: Oncology

## 2013-07-09 ENCOUNTER — Ambulatory Visit (INDEPENDENT_AMBULATORY_CARE_PROVIDER_SITE_OTHER): Payer: Medicare Other | Admitting: General Practice

## 2013-07-09 VITALS — BP 112/75 | HR 90 | Temp 98.6°F | Wt 261.0 lb

## 2013-07-09 DIAGNOSIS — R52 Pain, unspecified: Secondary | ICD-10-CM

## 2013-07-09 DIAGNOSIS — R942 Abnormal results of pulmonary function studies: Secondary | ICD-10-CM

## 2013-07-09 DIAGNOSIS — C9000 Multiple myeloma not having achieved remission: Secondary | ICD-10-CM

## 2013-07-09 DIAGNOSIS — J209 Acute bronchitis, unspecified: Secondary | ICD-10-CM

## 2013-07-09 DIAGNOSIS — J439 Emphysema, unspecified: Secondary | ICD-10-CM

## 2013-07-09 DIAGNOSIS — R05 Cough: Secondary | ICD-10-CM

## 2013-07-09 LAB — POCT INFLUENZA A/B: Influenza B, POC: NEGATIVE

## 2013-07-09 MED ORDER — BENZONATATE 100 MG PO CAPS: 100.0000 mg | ORAL_CAPSULE | Freq: Three times a day (TID) | ORAL | Status: DC | PRN

## 2013-07-09 MED ORDER — AZITHROMYCIN 250 MG PO TABS: ORAL_TABLET | ORAL | Status: DC

## 2013-07-09 MED ORDER — METHYLPREDNISOLONE ACETATE 80 MG/ML IJ SUSP
80.0000 mg | Freq: Once | INTRAMUSCULAR | Status: AC
  Administered 2013-07-09: 80 mg via INTRAMUSCULAR

## 2013-07-09 NOTE — Patient Instructions (Signed)
Acute Bronchitis Bronchitis is inflammation of the airways that extend from the windpipe into the lungs (bronchi). The inflammation often causes mucus to develop. This leads to a cough, which is the most common symptom of bronchitis.  In acute bronchitis, the condition usually develops suddenly and goes away over time, usually in a couple weeks. Smoking, allergies, and asthma can make bronchitis worse. Repeated episodes of bronchitis may cause further lung problems.  CAUSES Acute bronchitis is most often caused by the same virus that causes a cold. The virus can spread from person to person (contagious).  SIGNS AND SYMPTOMS   Cough.   Fever.   Coughing up mucus.   Body aches.   Chest congestion.   Chills.   Shortness of breath.   Sore throat.  DIAGNOSIS  Acute bronchitis is usually diagnosed through a physical exam. Tests, such as chest X-rays, are sometimes done to rule out other conditions.  TREATMENT  Acute bronchitis usually goes away in a couple weeks. Often times, no medical treatment is necessary. Medicines are sometimes given for relief of fever or cough. Antibiotics are usually not needed but may be prescribed in certain situations. In some cases, an inhaler may be recommended to help reduce shortness of breath and control the cough. A cool mist vaporizer may also be used to help thin bronchial secretions and make it easier to clear the chest.  HOME CARE INSTRUCTIONS  Get plenty of rest.   Drink enough fluids to keep your urine clear or pale yellow (unless you have a medical condition that requires fluid restriction). Increasing fluids may help thin your secretions and will prevent dehydration.   Only take over-the-counter or prescription medicines as directed by your health care provider.   Avoid smoking and secondhand smoke. Exposure to cigarette smoke or irritating chemicals will make bronchitis worse. If you are a smoker, consider using nicotine gum or skin  patches to help control withdrawal symptoms. Quitting smoking will help your lungs heal faster.   Reduce the chances of another bout of acute bronchitis by washing your hands frequently, avoiding people with cold symptoms, and trying not to touch your hands to your mouth, nose, or eyes.   Follow up with your health care provider as directed.  SEEK MEDICAL CARE IF: Your symptoms do not improve after 1 week of treatment.  SEEK IMMEDIATE MEDICAL CARE IF:  You develop an increased fever or chills.   You have chest pain.   You have severe shortness of breath.  You have bloody sputum.   You develop dehydration.  You develop fainting.  You develop repeated vomiting.  You develop a severe headache. MAKE SURE YOU:   Understand these instructions.  Will watch your condition.  Will get help right away if you are not doing well or get worse. Document Released: 08/05/2004 Document Revised: 02/28/2013 Document Reviewed: 12/19/2012 [employer] Patient Information 2014 [employer].  

## 2013-07-09 NOTE — Telephone Encounter (Signed)
Notes Recorded by Lesle Chris, LPN on 16/04/9603 at 9:17 AM Wife Angelique Blonder) notified. Will notify [facility] ([facility]) to schedule.

## 2013-07-09 NOTE — Progress Notes (Signed)
   Subjective:    Patient ID: Raiquan Chandler, male    DOB: Feb 25, 1936, 77 y.o.   MRN: [id number]  Cough This is a new problem. The current episode started 1 to 4 weeks ago (onset one week ago). The problem has been unchanged. The cough is non-productive. Associated symptoms include chills and wheezing. Pertinent negatives include no chest pain, fever, headaches, sore throat or shortness of breath. The symptoms are aggravated by lying down. He has tried nothing for the symptoms.      Review of Systems  Constitutional: Positive for chills. Negative for fever.  HENT: Negative for congestion and sore throat.   Respiratory: Positive for cough and wheezing. Negative for chest tightness and shortness of breath.   Cardiovascular: Negative for chest pain and palpitations.  Genitourinary: Negative for difficulty urinating.  Neurological: Negative for dizziness, weakness and headaches.       Objective:   Physical Exam  Constitutional: He is oriented to person, place, and time. He appears well-developed and well-nourished.  HENT:  Head: Normocephalic and atraumatic.  Right Ear: External ear normal.  Left Ear: External ear normal.  Mouth/Throat: Oropharynx is clear and moist.  Eyes: Pupils are equal, round, and reactive to light.  Cardiovascular: Normal rate, regular rhythm and normal heart sounds.   Pulmonary/Chest: Effort normal. No respiratory distress. He has wheezes in the right upper field and the left upper field. He exhibits no tenderness.  Bronchial cough  Neurological: He is oriented to person, place, and time.  Skin: Skin is warm and dry.  Psychiatric: He has a normal mood and affect.      Results for orders placed in visit on 07/09/13  POCT INFLUENZA A/B      Result Value Range   Influenza A, POC Negative     Influenza B, POC Negative         Assessment & Plan:  1. Body aches  - POCT Influenza A/B  2. Cough  - benzonatate (TESSALON) 100 MG capsule; Take 1 capsule (100  mg total) by mouth 3 (three) times daily as needed for cough.  Dispense: 30 capsule; Refill: 0  3. Acute bronchitis  - methylPREDNISolone acetate (DEPO-MEDROL) injection 80 mg; Inject 1 mL (80 mg total) into the muscle once. - azithromycin (ZITHROMAX) 250 MG tablet; Take as directed  Dispense: 6 tablet; Refill: 0 -increase fluids -RTO if symptoms worsen or unresolved Patient verbalized understanding Coralie Keens, FNP-C

## 2013-07-09 NOTE — Telephone Encounter (Signed)
appt today at 2:00pm

## 2013-07-09 NOTE — Telephone Encounter (Signed)
Message copied by Lesle Chris on Mon Jul 09, 2013  9:18 AM ------      Message from: Burnice Logan      Created: Thu Jun 28, 2013  6:16 PM                   ----- Message -----         From: Laqueta Linden, MD         Sent: 06/28/2013   5:42 PM           To: Burnice Logan, CMA            Please arrange for f/u with pulmonary. ------

## 2013-07-10 ENCOUNTER — Other Ambulatory Visit: Payer: Self-pay | Admitting: Oncology

## 2013-07-11 ENCOUNTER — Other Ambulatory Visit (HOSPITAL_BASED_OUTPATIENT_CLINIC_OR_DEPARTMENT_OTHER): Payer: Medicare Other

## 2013-07-11 ENCOUNTER — Ambulatory Visit (HOSPITAL_BASED_OUTPATIENT_CLINIC_OR_DEPARTMENT_OTHER): Payer: Medicare Other

## 2013-07-11 VITALS — BP 127/72 | HR 98 | Temp 97.4°F | Resp 20

## 2013-07-11 DIAGNOSIS — C9000 Multiple myeloma not having achieved remission: Secondary | ICD-10-CM

## 2013-07-11 DIAGNOSIS — Z5112 Encounter for antineoplastic immunotherapy: Secondary | ICD-10-CM

## 2013-07-11 LAB — CBC WITH DIFFERENTIAL/PLATELET
BASO%: 0.7 % (ref 0.0–2.0)
Basophils Absolute: 0 10*3/uL (ref 0.0–0.1)
EOS%: 2.6 % (ref 0.0–7.0)
Eosinophils Absolute: 0.1 10*3/uL (ref 0.0–0.5)
HCT: 40.7 % (ref 38.4–49.9)
HGB: 13.8 g/dL (ref 13.0–17.1)
MCH: 29.4 pg (ref 27.2–33.4)
MCHC: 34 g/dL (ref 32.0–36.0)
MCV: 86.4 fL (ref 79.3–98.0)
MONO#: 0.6 10*3/uL (ref 0.1–0.9)
NEUT#: 3 10*3/uL (ref 1.5–6.5)
NEUT%: 61 % (ref 39.0–75.0)
RDW: 17.3 % — ABNORMAL HIGH (ref 11.0–14.6)
lymph#: 1.2 10*3/uL (ref 0.9–3.3)

## 2013-07-11 MED ORDER — BORTEZOMIB CHEMO SQ INJECTION 3.5 MG (2.5MG/ML)
1.3000 mg/m2 | Freq: Once | INTRAMUSCULAR | Status: AC
  Administered 2013-07-11: 3 mg via SUBCUTANEOUS
  Filled 2013-07-11: qty 3

## 2013-07-11 MED ORDER — DEXAMETHASONE 4 MG PO TABS
20.0000 mg | ORAL_TABLET | Freq: Once | ORAL | Status: AC
  Administered 2013-07-11: 20 mg via ORAL

## 2013-07-11 MED ORDER — ONDANSETRON HCL 8 MG PO TABS
8.0000 mg | ORAL_TABLET | Freq: Once | ORAL | Status: AC
  Administered 2013-07-11: 8 mg via ORAL

## 2013-07-11 MED ORDER — DEXAMETHASONE 4 MG PO TABS
ORAL_TABLET | ORAL | Status: AC
  Filled 2013-07-11: qty 5

## 2013-07-11 MED ORDER — ONDANSETRON HCL 8 MG PO TABS
ORAL_TABLET | ORAL | Status: AC
  Filled 2013-07-11: qty 1

## 2013-07-11 NOTE — Patient Instructions (Signed)
[facility] Discharge Instructions for Patients Receiving Chemotherapy  Today you received the following chemotherapy agents Velcade   To help prevent nausea and vomiting after your treatment, we encourage you to take your nausea medication Compazine 10 mg every 6 hours as needed.   If you develop nausea and vomiting that is not controlled by your nausea medication, call the clinic.   BELOW ARE SYMPTOMS THAT SHOULD BE REPORTED IMMEDIATELY:  *FEVER GREATER THAN 100.5 F  *CHILLS WITH OR WITHOUT FEVER  NAUSEA AND VOMITING THAT IS NOT CONTROLLED WITH YOUR NAUSEA MEDICATION  *UNUSUAL SHORTNESS OF BREATH  *UNUSUAL BRUISING OR BLEEDING  TENDERNESS IN MOUTH AND THROAT WITH OR WITHOUT PRESENCE OF ULCERS  *URINARY PROBLEMS  *BOWEL PROBLEMS  UNUSUAL RASH Items with * indicate a potential emergency and should be followed up as soon as possible.  Feel free to call the clinic you have any questions or concerns. The clinic phone number is [phone].    

## 2013-07-13 ENCOUNTER — Ambulatory Visit (INDEPENDENT_AMBULATORY_CARE_PROVIDER_SITE_OTHER): Payer: Medicare HMO | Admitting: Internal Medicine

## 2013-07-13 ENCOUNTER — Encounter: Payer: Self-pay | Admitting: Internal Medicine

## 2013-07-13 VITALS — BP 120/74 | HR 86 | Ht 72.0 in | Wt 259.2 lb

## 2013-07-13 DIAGNOSIS — R0602 Shortness of breath: Secondary | ICD-10-CM

## 2013-07-13 DIAGNOSIS — G4733 Obstructive sleep apnea (adult) (pediatric): Secondary | ICD-10-CM

## 2013-07-13 NOTE — Progress Notes (Signed)
Patient ID: [name], male    DOB: 1936/02/16, 78 y.o.   MRN: [id number]  HPI 03/05/11- 78 year old male former smoker seen because of obstructive sleep apnea on kind referral by Dr. Jacelyn Grip from [facility] in [place]. Wife(?) is here. A diagnostic sleep study on 01/31/2008 was done at [facility] because of insomnia with sleep apnea. This study confirmed moderately severe obstructive sleep apnea with an AHI of 22 per hour. A full face mask became uncomfortable, he got tired of it and stopped using CPAP several months to a year ago. Wife now reports loud snoring and again says he stays sleepy during the day although he has difficulty falling asleep. Bedtime between 8 and 9 PM with sleep latency at least to 30 minutes. He feels that he awakens and will lie awake or get up and wander around in the home  half of each night. He finally gets up around 5 AM. When he is asleep he feels that he is sleeping well but this is quite fragmented. He admits drowsiness during the day if he sits quietly. Occasional cough in the morning. Feels smothered lying supine, better on his sides. Treated for hypertension and elevated cholesterol but he denies heart or lung disease. Denies ENT surgery.  He had smoked heavily but quit 20 years ago.  04/23/11-  78 year old male former smoker seen because of obstructive sleep apnea. A male companion is with him. He has not been able to be compliant with CPAP, blaming pains in the back of his neck and spine related to wearing the CPAP. We discussed how it might be constraining motion until his muscles gets stiff. He wore it for one week out of the two-week auto titration trial. He has an older machine that worked well. He says his mask leak so that was unusable. His home care company would not replace it because he old money. He wants to skip the auto titration, go back to his old machine, change equipment supplier and get a new mask. We discussed CPAP, indications and alternatives,  medical concerns, and the documentation of adequate compliance to meet the Medicare rules. He isn't sure of the pressure setting on his old machine, which was one of the reasons we went for auto titration.  06/04/11- 78 year old male former smoker seen because of obstructive sleep apnea. Wife here. He got a replacement CPAP mask but is using his old machine. [facility] changed his pressure to 10. The current mask does not leak. He complains of insomnia with or without CPAP and that seems to be the biggest problem interfering with a sustained use of CPAP now.  07/30/11- 78 year old male former smoker seen because of obstructive sleep apnea. Wife here He try his old CPAP machine again but complains that humidifier gurgles in the flows even when he tries putting it lower than the level of his head so water runs back into the machine. He tried without she notify her but didn't like that either. Wife doesn't think CPAP stopped his snoring. He asks for a new sleep study which is the documentation we really need. He is sometimes restless at night even if he takes a sleeping pill.  09/02/19- 78 year old male former smoker seen because of obstructive sleep apnea.     NPSG 08/22/11- moderate OSA, AHI 26.8/hr with CPAP titration to 9 cwp for AHI 0.5/ hr. Oxygen was added at 2 L/M due to desaturation, and will need follow-up.  11/29/11- 78 year old male former smoker seen because of obstructive sleep apnea.  Pt hasnt worn mask fo 1 month. pt denies any sob,wheezing, chest congestion He was hospitalized twice for back surgery and I think complication of that surgery. Through that, he dropped off of CPAP. Wife says he still uses it a few hours at a time and it works when he wears it. Control has seemed  good at 9 CWP/ [facility]. Sleep is also disturbed by pain in his knees  02/29/12- 78 year old male former smoker seen because of obstructive sleep apnea.        Wife here  CPAP mask is not working well-unable to get one  until September due to insurance; Trying to wear CPAP every night  for 8-9 hours, but current mask hurts his face.  08/31/12- 78 year old male former smoker seen because of obstructive sleep apnea. FOLLOWS FOR: trying to wear CPAP every night but has hard time with mask around nose and pressure blowing into eyes; also states hard to lay/sleep on back. Leak worse in his preferred R decubitus sleep position. Wife says he snores through at times, but much better with CPAP. He is also concerned about dyspnea, especially with exertion. Dr Benay Spice recently suggested he might need to see a heart doctor. Denies chest pain, cough or wheeze. Apparently not a new problem.  03/05/13- 78 year old male former smoker seen because of obstructive sleep apnea. FOLLOWS FOR: hard to pull air in when putting machine on; then feels like too much air through the night; Wears CPAP 9/ [facility] every night. Would like refill of sleep medication as well.  Ambien has worked well with no problems. We discussed this again. CXR 3//3/14 IMPRESSION:  Negative chest.  Original Report Authenticated By: Orlean Patten, M.D.  07/13/13- 78 year old male former smoker seen because of obstructive sleep apnea. FOLLOWS FOR: pt had PFT done 06-2013 and Dr Bronson Ing wanted pt to be seen due to abnormal PFT results. CPAP Auto 7-12 [facility].usually okay but at times he has trouble sleeping with it. Download indicates pressure of 10 would work. Now has an incidental cold. Routinely easy dyspnea on exertion without cough or wheeze. Ankles have been swelling. Says cardiac workup negative. ABG on room air 06/15/2013-pH 7.44, PCO2 35.7, PO2 75.7, bicarbonate 24.1. Slight respiratory alkalosis. PFT 06/15/2013-mild obstructive airways disease with insignificant response to bronchodilator, slight air trapping, diffusion severely reduced. FEC 2.88/71%, FEV1 2.28/76%, FEV1/FVC 0.79/105%, FEF 25-75% 1.63/66%. RV/TLC 128%, DLCO 47%. CXR  04/09/13 IMPRESSION:  Minimal bibasilar atelectasis or scarring.  Electronically Signed  By: Lorin Picket M.D.  On: 04/09/2013 16:49  Review of Systems-see HPI Constitutional:   No-   weight loss, night sweats, fevers, chills, +fatigue, lassitude HEENT:   No-  headaches, difficulty swallowing, tooth/dental problems, sore throat,       No-  sneezing, itching, ear ache, nasal congestion, post nasal drip,  CV:  No-   chest pain, orthopnea, PND, swelling in lower extremities, anasarca, dizziness, palpitations Resp: + shortness of breath with exertion or at rest.              No-   productive cough,  No non-productive cough,  No-  coughing up of blood.              No-   change in color of mucus.  No- wheezing.   Skin: No-   rash or lesions. GI:  No-   heartburn, indigestion, abdominal pain, nausea, vomiting,  GU: MS:  No-   joint pain or swelling.   Neuro- normal:  Psych:  No- change in mood  or affect. No depression or anxiety.  No memory loss.  Objective:   Physical Exam General- Alert, Oriented, Affect-appropriate, Distress- none acute,  Overweight, calm/laconic but seems intelligent. Yawning Using a walker. Skin- rash-none, lesions- none, excoriation- none Lymphadenopathy- none Head- atraumatic            Eyes- Gross vision intact, PERRLA, conjunctivae clear secretions, strabismus            Ears- Hearing, canals-normal            Nose- Clear, no-Septal dev, mucus, polyps, erosion, perforation             Throat- Mallampati II-III , mucosa clear , drainage- none, tonsils- atrophic Neck- flexible , trachea midline, no stridor , thyroid nl, carotid no bruit Chest - symmetrical excursion , unlabored           Heart/CV- RRR , no murmur , no gallop  , no rub, nl s1 s2                           +JVD 1 cm , edema+trace, stasis changes- none, varices- none           Lung-  clear, wheeze- none, cough- none , dullness-none, rub- none           Chest wall-  Abd- Br/ Gen/ Rectal- Not  done, not indicated Extrem- cyanosis- none, clubbing, none, atrophy- none, strength- nl Neuro- grossly intact to observation

## 2013-07-13 NOTE — Patient Instructions (Signed)
Order- [facility]ME [facility]- change CPAP to fixed 10 cwp       Dx OSA  Sample Spiriva      Inhale once daily   By the time you have used up the sample, see if it made any real difference in your shortness of breath with exertion.  Walk and move as much as you can, to help lose weight and build your stamina.

## 2013-07-16 ENCOUNTER — Telehealth: Payer: Self-pay | Admitting: *Deleted

## 2013-07-16 MED ORDER — CYCLOPHOSPHAMIDE 50 MG PO TABS: 600.0000 mg | ORAL_TABLET | ORAL | Status: DC

## 2013-07-16 NOTE — Telephone Encounter (Signed)
Called to report [name] is out of his chemo pills. Called [facility] and provided verbal refill for cytoxan with same dosing directions per Dr. Benay Spice. [name] notified.

## 2013-07-19 ENCOUNTER — Ambulatory Visit (HOSPITAL_BASED_OUTPATIENT_CLINIC_OR_DEPARTMENT_OTHER): Payer: Medicare HMO

## 2013-07-19 ENCOUNTER — Other Ambulatory Visit (HOSPITAL_BASED_OUTPATIENT_CLINIC_OR_DEPARTMENT_OTHER): Payer: Medicare HMO

## 2013-07-19 VITALS — BP 125/74 | HR 101 | Temp 97.0°F | Resp 19

## 2013-07-19 DIAGNOSIS — C9000 Multiple myeloma not having achieved remission: Secondary | ICD-10-CM

## 2013-07-19 DIAGNOSIS — Z5112 Encounter for antineoplastic immunotherapy: Secondary | ICD-10-CM

## 2013-07-19 LAB — CBC WITH DIFFERENTIAL/PLATELET
BASO%: 0.2 % (ref 0.0–2.0)
Basophils Absolute: 0 10*3/uL (ref 0.0–0.1)
EOS%: 1.6 % (ref 0.0–7.0)
Eosinophils Absolute: 0.1 10*3/uL (ref 0.0–0.5)
HCT: 35.2 % — ABNORMAL LOW (ref 38.4–49.9)
HGB: 12.8 g/dL — ABNORMAL LOW (ref 13.0–17.1)
LYMPH%: 18.7 % (ref 14.0–49.0)
MCH: 29.4 pg (ref 27.2–33.4)
MCHC: 36.4 g/dL — ABNORMAL HIGH (ref 32.0–36.0)
MCV: 80.7 fL (ref 79.3–98.0)
MONO#: 0.9 10*3/uL (ref 0.1–0.9)
MONO%: 19.6 % — ABNORMAL HIGH (ref 0.0–14.0)
NEUT#: 2.7 10*3/uL (ref 1.5–6.5)
NEUT%: 59.9 % (ref 39.0–75.0)
Platelets: 121 10*3/uL — ABNORMAL LOW (ref 140–400)
RBC: 4.36 10*6/uL (ref 4.20–5.82)
RDW: 16.6 % — ABNORMAL HIGH (ref 11.0–14.6)
WBC: 4.4 10*3/uL (ref 4.0–10.3)
lymph#: 0.8 10*3/uL — ABNORMAL LOW (ref 0.9–3.3)
nRBC: 0 % (ref 0–0)

## 2013-07-19 MED ORDER — DEXAMETHASONE 4 MG PO TABS
ORAL_TABLET | ORAL | Status: AC
  Filled 2013-07-19: qty 5

## 2013-07-19 MED ORDER — DEXAMETHASONE 4 MG PO TABS
20.0000 mg | ORAL_TABLET | Freq: Once | ORAL | Status: AC
  Administered 2013-07-19: 20 mg via ORAL

## 2013-07-19 MED ORDER — BORTEZOMIB CHEMO SQ INJECTION 3.5 MG (2.5MG/ML)
1.3000 mg/m2 | Freq: Once | INTRAMUSCULAR | Status: AC
  Administered 2013-07-19: 3 mg via SUBCUTANEOUS
  Filled 2013-07-19: qty 3

## 2013-07-19 MED ORDER — ONDANSETRON HCL 8 MG PO TABS
8.0000 mg | ORAL_TABLET | Freq: Once | ORAL | Status: AC
  Administered 2013-07-19: 8 mg via ORAL

## 2013-07-19 MED ORDER — ONDANSETRON HCL 8 MG PO TABS
ORAL_TABLET | ORAL | Status: AC
  Filled 2013-07-19: qty 1

## 2013-07-19 NOTE — Patient Instructions (Signed)
[facility] Discharge Instructions for Patients Receiving Chemotherapy  Today you received the following chemotherapy agents: velcade  To help prevent nausea and vomiting after your treatment, we encourage you to take your nausea medication: Compazine 10 mg every 6 hrs as needed.   If you develop nausea and vomiting that is not controlled by your nausea medication, call the clinic.   BELOW ARE SYMPTOMS THAT SHOULD BE REPORTED IMMEDIATELY:  *FEVER GREATER THAN 100.5 F  *CHILLS WITH OR WITHOUT FEVER  NAUSEA AND VOMITING THAT IS NOT CONTROLLED WITH YOUR NAUSEA MEDICATION  *UNUSUAL SHORTNESS OF BREATH  *UNUSUAL BRUISING OR BLEEDING  TENDERNESS IN MOUTH AND THROAT WITH OR WITHOUT PRESENCE OF ULCERS  *URINARY PROBLEMS  *BOWEL PROBLEMS  UNUSUAL RASH Items with * indicate a potential emergency and should be followed up as soon as possible.  Feel free to call the clinic you have any questions or concerns. The clinic phone number is [phone].

## 2013-07-23 ENCOUNTER — Telehealth: Payer: Self-pay | Admitting: *Deleted

## 2013-07-23 NOTE — Telephone Encounter (Signed)
[name] left VM that [facility] told her today that there was a problem with his cytoxan refill. She is asking for office to call [facility] to straighten it out. Spoke with Delcie Roch at [employer] sees no problem with his refill. It is scheduled to be sent for delivery by 07/26/13. Called [name] back with this information.

## 2013-07-24 ENCOUNTER — Telehealth: Payer: Self-pay | Admitting: *Deleted

## 2013-07-24 NOTE — Telephone Encounter (Signed)
VM from representative (unable to understand her name) that more information is needed to process a medication request for patient. Form is being faxed to our office [phone] (triage #). Need it completed and sent back ASAP since this is an "urgent" case. Checked w/triage-have not received any forms as of yet.

## 2013-07-25 ENCOUNTER — Telehealth: Payer: Self-pay | Admitting: *Deleted

## 2013-07-25 ENCOUNTER — Other Ambulatory Visit: Payer: Self-pay | Admitting: Oncology

## 2013-07-25 ENCOUNTER — Encounter: Payer: Self-pay | Admitting: Oncology

## 2013-07-25 NOTE — Telephone Encounter (Signed)
Wife called back concerned that delivery of his cytoxan will not be until 1/15 and the will be a day late taking his medicine. Made her aware that she should not be concerned with the 1 day delay in taking his cytoxan.

## 2013-07-25 NOTE — Progress Notes (Signed)
Called [facility] about cytoxan, they spoke with the patient's wife and the copay is 0.

## 2013-07-25 NOTE — Telephone Encounter (Signed)
Requesting call from someone "right away" about his medication. Pharmacy said his copay is  $100 and they can't afford this. Wants "Dr. Benay Spice to fix it". Pharmacy told her to get on computer to get assistance, but she does not understand computers. Forwared request to managed care to clarify situation with pharmacy and talk with wife. May need to bring in income verification information when he is here tomorrow.

## 2013-07-26 ENCOUNTER — Ambulatory Visit (HOSPITAL_BASED_OUTPATIENT_CLINIC_OR_DEPARTMENT_OTHER): Payer: Medicare HMO | Admitting: Oncology

## 2013-07-26 ENCOUNTER — Telehealth: Payer: Self-pay | Admitting: Oncology

## 2013-07-26 ENCOUNTER — Ambulatory Visit (HOSPITAL_BASED_OUTPATIENT_CLINIC_OR_DEPARTMENT_OTHER): Payer: Medicare HMO

## 2013-07-26 ENCOUNTER — Other Ambulatory Visit (HOSPITAL_BASED_OUTPATIENT_CLINIC_OR_DEPARTMENT_OTHER): Payer: Medicare HMO

## 2013-07-26 VITALS — BP 105/64 | HR 91 | Temp 97.0°F | Resp 20 | Ht 72.0 in | Wt 258.6 lb

## 2013-07-26 DIAGNOSIS — C9000 Multiple myeloma not having achieved remission: Secondary | ICD-10-CM

## 2013-07-26 DIAGNOSIS — T451X5A Adverse effect of antineoplastic and immunosuppressive drugs, initial encounter: Secondary | ICD-10-CM

## 2013-07-26 DIAGNOSIS — E119 Type 2 diabetes mellitus without complications: Secondary | ICD-10-CM

## 2013-07-26 DIAGNOSIS — K5900 Constipation, unspecified: Secondary | ICD-10-CM

## 2013-07-26 DIAGNOSIS — D630 Anemia in neoplastic disease: Secondary | ICD-10-CM

## 2013-07-26 DIAGNOSIS — D6481 Anemia due to antineoplastic chemotherapy: Secondary | ICD-10-CM

## 2013-07-26 DIAGNOSIS — Z5112 Encounter for antineoplastic immunotherapy: Secondary | ICD-10-CM

## 2013-07-26 LAB — COMPREHENSIVE METABOLIC PANEL (CC13)
ALT: 12 U/L (ref 0–55)
ANION GAP: 10 meq/L (ref 3–11)
AST: 22 U/L (ref 5–34)
Albumin: 3.5 g/dL (ref 3.5–5.0)
Alkaline Phosphatase: 53 U/L (ref 40–150)
BILIRUBIN TOTAL: 0.56 mg/dL (ref 0.20–1.20)
BUN: 14.9 mg/dL (ref 7.0–26.0)
CO2: 24 mEq/L (ref 22–29)
Calcium: 9.4 mg/dL (ref 8.4–10.4)
Chloride: 107 mEq/L (ref 98–109)
Creatinine: 1 mg/dL (ref 0.7–1.3)
Glucose: 110 mg/dl (ref 70–140)
Potassium: 4.2 mEq/L (ref 3.5–5.1)
SODIUM: 141 meq/L (ref 136–145)
Total Protein: 6.5 g/dL (ref 6.4–8.3)

## 2013-07-26 LAB — CBC WITH DIFFERENTIAL/PLATELET
BASO%: 0.7 % (ref 0.0–2.0)
Basophils Absolute: 0 10*3/uL (ref 0.0–0.1)
EOS%: 2 % (ref 0.0–7.0)
Eosinophils Absolute: 0.1 10*3/uL (ref 0.0–0.5)
HCT: 37.3 % — ABNORMAL LOW (ref 38.4–49.9)
HEMOGLOBIN: 12.7 g/dL — AB (ref 13.0–17.1)
LYMPH#: 0.7 10*3/uL — AB (ref 0.9–3.3)
LYMPH%: 14.9 % (ref 14.0–49.0)
MCH: 29.6 pg (ref 27.2–33.4)
MCHC: 34.1 g/dL (ref 32.0–36.0)
MCV: 86.9 fL (ref 79.3–98.0)
MONO#: 0.8 10*3/uL (ref 0.1–0.9)
MONO%: 18 % — ABNORMAL HIGH (ref 0.0–14.0)
NEUT#: 2.9 10*3/uL (ref 1.5–6.5)
NEUT%: 64.4 % (ref 39.0–75.0)
PLATELETS: 106 10*3/uL — AB (ref 140–400)
RBC: 4.28 10*6/uL (ref 4.20–5.82)
RDW: 17.7 % — ABNORMAL HIGH (ref 11.0–14.6)
WBC: 4.5 10*3/uL (ref 4.0–10.3)

## 2013-07-26 MED ORDER — ONDANSETRON HCL 8 MG PO TABS
8.0000 mg | ORAL_TABLET | Freq: Once | ORAL | Status: AC
  Administered 2013-07-26: 8 mg via ORAL

## 2013-07-26 MED ORDER — DEXAMETHASONE 4 MG PO TABS
ORAL_TABLET | ORAL | Status: AC
  Filled 2013-07-26: qty 5

## 2013-07-26 MED ORDER — ONDANSETRON HCL 8 MG PO TABS
ORAL_TABLET | ORAL | Status: AC
  Filled 2013-07-26: qty 1

## 2013-07-26 MED ORDER — DEXAMETHASONE 4 MG PO TABS
20.0000 mg | ORAL_TABLET | Freq: Once | ORAL | Status: AC
  Administered 2013-07-26: 20 mg via ORAL

## 2013-07-26 MED ORDER — BORTEZOMIB CHEMO SQ INJECTION 3.5 MG (2.5MG/ML)
1.3000 mg/m2 | Freq: Once | INTRAMUSCULAR | Status: AC
  Administered 2013-07-26: 3 mg via SUBCUTANEOUS
  Filled 2013-07-26: qty 3

## 2013-07-26 NOTE — Telephone Encounter (Signed)
gv pt appt schedule for january and february. per BS no referral needed.

## 2013-07-26 NOTE — Patient Instructions (Signed)
[facility] Discharge Instructions for Patients Receiving Chemotherapy  Today you received the following chemotherapy agents: Velcade.  To help prevent nausea and vomiting after your treatment, we encourage you to take your nausea medication as prescribed.   If you develop nausea and vomiting that is not controlled by your nausea medication, call the clinic.   BELOW ARE SYMPTOMS THAT SHOULD BE REPORTED IMMEDIATELY:  *FEVER GREATER THAN 100.5 F  *CHILLS WITH OR WITHOUT FEVER  NAUSEA AND VOMITING THAT IS NOT CONTROLLED WITH YOUR NAUSEA MEDICATION  *UNUSUAL SHORTNESS OF BREATH  *UNUSUAL BRUISING OR BLEEDING  TENDERNESS IN MOUTH AND THROAT WITH OR WITHOUT PRESENCE OF ULCERS  *URINARY PROBLEMS  *BOWEL PROBLEMS  UNUSUAL RASH Items with * indicate a potential emergency and should be followed up as soon as possible.  Feel free to call the clinic you have any questions or concerns. The clinic phone number is [phone].    

## 2013-07-26 NOTE — Progress Notes (Signed)
[name]    OFFICE PROGRESS NOTE   INTERVAL HISTORY:   [name] returns for scheduled followup of multiple myeloma. He continues Cytoxan/Velcade/Decadron. He complains of erythema and watering of the eyes. He relates this to the CPAP mask. This has occurred in the past. He is concerned about increased fat of the lower abdomen. He continues to have constipation.  Objective:  Vital signs in last 24 hours:  Blood pressure 105/64, pulse 91, temperature 97 F (36.1 C), temperature source Oral, resp. rate 20, height 6' (1.829 m), weight 258 lb 9.6 oz (117.3 kg).    HEENT: No thrush or ulcers, mild conjunctival erythema Resp: Lungs clear bilaterally Cardio: Regular rate and rhythm GI: No hepatosplenomegaly, no mass Vascular: No leg edema   Lab Results:  Lab Results  Component Value Date   WBC 4.5 07/26/2013   HGB 12.7* 07/26/2013   HCT 37.3* 07/26/2013   MCV 86.9 07/26/2013   PLT 106* 07/26/2013   NEUTROABS 2.9 07/26/2013   06/21/2013: Serum M spike 0.4 a, IgG 853   Medications: I have reviewed the patient's current medications.  Assessment/Plan: 1. Multiple myeloma, status post treatment with melphalan/prednisone/thalidomide beginning in November 2009. The thalidomide was increased to 200 mg daily beginning 09/11/2008. The serum M spike was slightly improved on 03/18/2009 and slightly increased on 05/09/2009. He has been maintained off of specific therapy for multiple myeloma since September 2010. The serum M spike was increased on 10/20/2011 at 4.6. He began Revlimid on 11/11/2011. He takes weekly dexamethasone. He began cycle 2 of Revlimid on 12/09/2011. The serum M spike was improved on 12/31/2011. The serum M spike was slightly higher on 01/28/2012. He began cycle 4 on 02/03/2012. He developed recurrent hypercalcemia. Treatment was changed to Cytoxan/Velcade/Decadron beginning 03/02/2012. The serum M spike is stable and the IgG continues to decline 2. Chronic  "dizziness."  3. Chest x-ray 11/01/2008 with a patchy opacity at the right midlung suspicious for pneumonia. He was treated with Avelox. 4. Low back pain with a radicular component. An MRI on 10/07/2008 showed no involvement of the lumbosacral spine with myeloma. 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 5. Bilateral neck pain, status post a CT 05/17/2008 with findings of spinal stenosis and osteoarthritis. 6. 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 Red cell microcytosis, likely related to hemoglobin C trait. A ferritin level returned elevated and a stool Hemoccult was negative 01/23/2010. He underwent a colonoscopy in 2009. 8. Elevated beta-2 microglobulin level. 9. History of hypertension. 10. History of a herniated disk at L4-L5 on MRI an 01/11/2002. 11. Hyperlipidemia. 12. Gastroesophageal reflux disease. 13. History of atypical angina. 14. History of rectal bleeding-large external hemorrhoids noted 02/25/2012. The stool was Hemoccult positive. He has a history of colon polyps. He is followed by [facility]. He was diagnosed with hemorrhoids in September of 2013 while hospitalized. He underwent a band procedure 03/09/2013. He has had no further rectal bleeding. 15. Superficial venous thrombosis of the greater saphenous vein on a Doppler 09/27/2008. Negative for deep vein thrombosis. Symptoms improved with aspirin. 16. Neck pain with numbness/tingling in the arms, hands, and low back with proximal right leg weakness. An MRI of the cervical spine 05/30/2009 showed multilevel spondylosis with mild cord edema at C4-C5 and C5-C6. He was noted to have an enhancing lesion of the cord at T3-T4 of unclear etiology. In the lumbar spine  there was no change in the spondylosis at L3-L4 and L4-L5. There was no involvement of the cervical or lumbar spine  with myeloma. He is status post C4-C5, C5-C6, and C6-C7 anterior cervical diskectomy with fusion by Dr. Annette Stable 08/01/2009. 17. History of mild thrombocytopenia. 18. Indeterminate-age deep vein thrombosis of the left lower extremity on a venous Doppler 08/15/2009. There were also findings consistent with superficial thrombosis involving the right lower extremity 08/15/2009. Coumadin was discontinued in October 2011. 19. Type 2 diabetes. 20. History of hematuria, followed at [facility]. Urinalysis was negative for blood on 07/23/2011. 21. Radicular back pain with MRI of the lumbar spine on 10/19/2011 showing nerve root impingement at L2-L3, L3-L4 and L4-L5 with the most severe at the right L3 nerve roots due to a large disc fragment. Associated right leg weakness. He underwent right L2-3 decompressive laminotomy with right L2 and L3 decompressive foraminotomy; right L2-3 microdiscectomy on 11/01/2011. 22. Constipation He continues a laxative regimen. 23. Hospitalization 11/08/2011 through 11/10/2011 with orthostatic hypotension/dizziness. He improved with intravenous hydration. He had mild hypotension when here on 11/18/2011. We recommended discontinuing Norvasc and Proscar. 24. Hypercalcemia status post pamidronate 11/03/2011. Recurrent hypercalcemia 02/16/2012 status post Zometa. The calcium has remained in normal range. 25. Fractured tooth with associated pain. Status post a tooth extraction by Dr. Enrique Sack, Zometa was placed on hold. 26. Exertional dyspnea. Stable. He was referred to cardiology, pulmonary function studies 06/15/2013 a moderately severe diffusion defect 27. 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   Disposition:  [name] appears stable. He continues to tolerate the chemotherapy well. The plan is to continue Cytoxan/Velcade/Decadron as long as the IgG continues to decline. We will followup on the IgG and serum M spike from today.  He will return for an office visit next month. He will contact us if the conjunctival erythema does not improve.   Betsy Coder, MD  07/26/2013  1:26 PM

## 2013-07-27 ENCOUNTER — Encounter: Payer: Self-pay | Admitting: *Deleted

## 2013-07-27 NOTE — Progress Notes (Signed)
RECEIVED A FAX FROM [facility] CONCERNING A CONFIRMATION OF PRESCRIPTION SHIPMENT FOR CYCLOPHOSPHAMIDE ON 07/25/13.

## 2013-07-30 LAB — PROTEIN ELECTROPHORESIS, SERUM
ALPHA-2-GLOBULIN: 10.9 % (ref 7.1–11.8)
Albumin ELP: 61.1 % (ref 55.8–66.1)
Alpha-1-Globulin: 4 % (ref 2.9–4.9)
BETA GLOBULIN: 6.2 % (ref 4.7–7.2)
Beta 2: 3.4 % (ref 3.2–6.5)
GAMMA GLOBULIN: 14.4 % (ref 11.1–18.8)
M-SPIKE, %: 0.41 g/dL
Total Protein, Serum Electrophoresis: 6 g/dL (ref 6.0–8.3)

## 2013-07-30 LAB — IGG: IGG (IMMUNOGLOBIN G), SERUM: 925 mg/dL (ref 650–1600)

## 2013-07-31 ENCOUNTER — Telehealth: Payer: Self-pay | Admitting: *Deleted

## 2013-07-31 ENCOUNTER — Ambulatory Visit (HOSPITAL_COMMUNITY)
Admission: RE | Admit: 2013-07-31 | Discharge: 2013-07-31 | Disposition: A | Discharge status: Home / Self Care | Payer: Medicare HMO | Source: Ambulatory Visit | Attending: Nurse Practitioner | Admitting: Nurse Practitioner

## 2013-07-31 ENCOUNTER — Other Ambulatory Visit: Payer: Self-pay | Admitting: Nurse Practitioner

## 2013-07-31 ENCOUNTER — Telehealth: Payer: Self-pay | Admitting: Oncology

## 2013-07-31 ENCOUNTER — Ambulatory Visit (HOSPITAL_BASED_OUTPATIENT_CLINIC_OR_DEPARTMENT_OTHER): Payer: Medicare HMO | Admitting: Nurse Practitioner

## 2013-07-31 VITALS — BP 136/79 | HR 92 | Temp 97.7°F | Resp 19 | Ht 72.0 in | Wt 260.5 lb

## 2013-07-31 DIAGNOSIS — C9000 Multiple myeloma not having achieved remission: Secondary | ICD-10-CM

## 2013-07-31 DIAGNOSIS — D6481 Anemia due to antineoplastic chemotherapy: Secondary | ICD-10-CM

## 2013-07-31 DIAGNOSIS — R9389 Abnormal findings on diagnostic imaging of other specified body structures: Secondary | ICD-10-CM | POA: Insufficient documentation

## 2013-07-31 DIAGNOSIS — Z981 Arthrodesis status: Secondary | ICD-10-CM | POA: Insufficient documentation

## 2013-07-31 DIAGNOSIS — R079 Chest pain, unspecified: Secondary | ICD-10-CM | POA: Insufficient documentation

## 2013-07-31 DIAGNOSIS — M549 Dorsalgia, unspecified: Secondary | ICD-10-CM | POA: Insufficient documentation

## 2013-07-31 DIAGNOSIS — R0789 Other chest pain: Secondary | ICD-10-CM

## 2013-07-31 DIAGNOSIS — T451X5A Adverse effect of antineoplastic and immunosuppressive drugs, initial encounter: Secondary | ICD-10-CM

## 2013-07-31 DIAGNOSIS — R42 Dizziness and giddiness: Secondary | ICD-10-CM

## 2013-07-31 DIAGNOSIS — I10 Essential (primary) hypertension: Secondary | ICD-10-CM

## 2013-07-31 DIAGNOSIS — E119 Type 2 diabetes mellitus without complications: Secondary | ICD-10-CM

## 2013-07-31 MED ORDER — LACTULOSE 10 GM/15ML PO SOLN: 10.0000 g | Freq: Two times a day (BID) | ORAL | Status: DC | PRN

## 2013-07-31 NOTE — Telephone Encounter (Signed)
Call from pt's wife reporting he has "something going on and it's urgent." Spoke with pt, he reports RUQ and R axillary pain. Began about 2 weeks ago. Reports it is sharp and brief. Also a "heavy" pain. Requests to be worked in for appt. Reviewed with Dr. Benay Spice: order received to have pt see NP today. Called wife with appt.

## 2013-07-31 NOTE — Progress Notes (Addendum)
OFFICE PROGRESS NOTE  Interval history:  [name] returns prior to scheduled followup for evaluation of back pain. He reports a several month history of pain at the right posterior lateral lower chest. A few weeks ago the pain significantly increased as he was reaching for his seatbelt. Yesterday he noted the pain radiated across the lower anterior chest. He denies fever, cough and shortness of breath. No shaking chills to pain. He notes stable chronic leg swelling. He denies extremity weakness or numbness. No bowel or bladder dysfunction. Bowels continue to move regularly with the aid of a laxative.  Objective: Filed Vitals:   07/31/13 1410  BP: 136/79  Pulse: 92  Temp: 97.7 F (36.5 C)  Resp: 19   No thrush or ulcerations. Faint inspiratory rales at both lung bases. No respiratory distress. Regular cardiac rhythm. Abdomen is soft and nontender. No organomegaly. No leg edema. Nontender over the right lower posterolateral chest and right anterior lower chest. No skin rash. Slight weakness right proximal leg. Motor strength otherwise 5 over 5. Knee DTRs 1+, symmetric. Sensation intact to light touch over the abdomen and chest wall.   Lab Results: Lab Results  Component Value Date   WBC 4.5 07/26/2013   HGB 12.7* 07/26/2013   HCT 37.3* 07/26/2013   MCV 86.9 07/26/2013   PLT 106* 07/26/2013   NEUTROABS 2.9 07/26/2013    Chemistry:    Chemistry      Component Value Date/Time   NA 141 07/26/2013 1055   NA 136 02/10/2013 1928   K 4.2 07/26/2013 1055   K 3.6 02/10/2013 1928   CL 100 02/10/2013 1928   CL 106 12/28/2012 1059   CO2 24 07/26/2013 1055   CO2 26 02/10/2013 1928   BUN 14.9 07/26/2013 1055   BUN 18 02/10/2013 1928   CREATININE 1.0 07/26/2013 1055   CREATININE 0.82 02/10/2013 1928   CREATININE 0.89 01/31/2013 1217      Component Value Date/Time   CALCIUM 9.4 07/26/2013 1055   CALCIUM 8.5 02/10/2013 1928   ALKPHOS 53 07/26/2013 1055   ALKPHOS 54 02/10/2013 1928   AST 22 07/26/2013 1055   AST 19  02/10/2013 1928   ALT 12 07/26/2013 1055   ALT 14 02/10/2013 1928   BILITOT 0.56 07/26/2013 1055   BILITOT 0.2* 02/10/2013 1928       Studies/Results: No results found.  Medications: I have reviewed the patient's current medications.  Assessment/Plan: 1. Multiple myeloma, status post treatment with melphalan/prednisone/thalidomide beginning in November 2009. The thalidomide was increased to 200 mg daily beginning 09/11/2008. The serum M spike was slightly improved on 03/18/2009 and slightly increased on 05/09/2009. He has been maintained off of specific therapy for multiple myeloma since September 2010. The serum M spike was increased on 10/20/2011 at 4.6. He began Revlimid on 11/11/2011. He takes weekly dexamethasone. He began cycle 2 of Revlimid on 12/09/2011. The serum M spike was improved on 12/31/2011. The serum M spike was slightly higher on 01/28/2012. He began cycle 4 on 02/03/2012. He developed recurrent hypercalcemia. Treatment was changed to Cytoxan/Velcade/Decadron beginning 03/02/2012. The serum M spike was stable 07/26/2013; serum IgG mildly increased within the normal range 07/26/2013. 2. Chronic "dizziness."  3. Chest x-ray 11/01/2008 with a patchy opacity at the right midlung suspicious for pneumonia. He was treated with Avelox. 4. Low back pain with a radicular component. An MRI on 10/07/2008 showed no involvement of the lumbosacral spine with myeloma. 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 5. Bilateral neck pain, status post a CT 05/17/2008 with findings of spinal stenosis and osteoarthritis. 6. 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 Red cell microcytosis, likely related to hemoglobin C trait. A ferritin level returned elevated and a stool Hemoccult was negative 01/23/2010. He underwent a colonoscopy in 2009. 8. Elevated  beta-2 microglobulin level. 9. History of hypertension. 10. History of a herniated disk at L4-L5 on MRI an 01/11/2002. 11. Hyperlipidemia. 12. Gastroesophageal reflux disease. 13. History of atypical angina. 14. History of rectal bleeding-large external hemorrhoids noted 02/25/2012. The stool was Hemoccult positive. He has a history of colon polyps. He is followed by [facility]. He was diagnosed with hemorrhoids in September of 2013 while hospitalized. He underwent a band procedure 03/09/2013. He has had no further rectal bleeding. 15. Superficial venous thrombosis of the greater saphenous vein on a Doppler 09/27/2008. Negative for deep vein thrombosis. Symptoms improved with aspirin. 16. Neck pain with numbness/tingling in the arms, hands, and low back with proximal right leg weakness. An MRI of the cervical spine 05/30/2009 showed multilevel spondylosis with mild cord edema at C4-C5 and C5-C6. He was noted to have an enhancing lesion of the cord at T3-T4 of unclear etiology. In the lumbar spine there was no change in the spondylosis at L3-L4 and L4-L5. There was no involvement of the cervical or lumbar spine with myeloma. He is status post C4-C5, C5-C6, and C6-C7 anterior cervical diskectomy with fusion by Dr. Pool 08/01/2009. 17. History of mild thrombocytopenia. 18. Indeterminate-age deep vein thrombosis of the left lower extremity on a venous Doppler 08/15/2009. There were also findings consistent with superficial thrombosis involving the right lower extremity 08/15/2009. Coumadin was discontinued in October 2011. 19. Type 2 diabetes. 20. History of hematuria, followed at [facility]. Urinalysis was negative for blood on 07/23/2011. 21. Radicular back pain with MRI of the lumbar spine on 10/19/2011 showing nerve root impingement at L2-L3, L3-L4 and L4-L5 with the most severe at the right L3 nerve roots due to a large disc fragment. Associated right leg weakness. He underwent right L2-3  decompressive laminotomy with right L2 and L3 decompressive foraminotomy; right L2-3 microdiscectomy on 11/01/2011. 22. Constipation He continues a laxative regimen. 23. Hospitalization 11/08/2011 through 11/10/2011 with orthostatic hypotension/dizziness. He improved with intravenous hydration. He had mild hypotension when here on 11/18/2011. We recommended discontinuing Norvasc and Proscar. 24. Hypercalcemia status post pamidronate 11/03/2011. Recurrent hypercalcemia 02/16/2012 status post Zometa. The calcium has remained in normal range. 25. Fractured tooth with associated pain. Status post a tooth extraction by Dr. Kulinski, Zometa was placed on hold. 26. Exertional dyspnea. Stable. He was referred to cardiology, pulmonary function studies 06/15/2013 showed a moderately severe diffusion defect. 27. 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  28. Pain right posterolateral lower chest with radicular component to anterior chest.   Dispositon-Mr. Kage presents with progressive pain at the right posterolateral lower chest with a more recent radicular component to the anterior chest. We are referring him for plain x-ray evaluation of the thoracic spine and right ribs, question myeloma, question compression fracture, question rib fracture. We offered pain medication multiple times. He declines any pain medication at this time.  Most recent myeloma labs showed a stable serum M spike and mild increase of IgG within the normal range. Dr. Sherrill recommends discontinuation of Cytoxan and continuation of Velcade/dexamethasone.  We will followup on the   x-ray studies. He is scheduled to return for an office visit on 07/06/2014. We will adjust accordingly pending the x-ray results. He was instructed to contact the office with worsening pain or onset of any neurologic symptoms. He and his wife expressed their understanding.   Patient seen with Dr. Sherrill.  25  minutes were spent face-to-face at today's visit with the majority of that time involved in counseling/coordination of care.   ,  ANP/GNP-BC  This was a shared visit with  . Mr. Bard was interviewed and examined. He will be referred for x-rays of the thoracic spine and ribs to evaluate the right posterior lateral chest pain.  We decided to discontinue cyclophosphamide and continue maintenance Velcade/Decadron for the multiple myeloma. The serum M spike and IgG levels have stabilized.  Brad Sherrill, M.D.     

## 2013-07-31 NOTE — Telephone Encounter (Signed)
Sent pt to x-ray, pt already have appt calendar for January and February 2015

## 2013-08-05 NOTE — Assessment & Plan Note (Signed)
Good compliance and comfort is not ideal. Plan-set fixed pressure of 10

## 2013-08-05 NOTE — Assessment & Plan Note (Signed)
ABG on room air 06/15/2013-pH 7.44, PCO2 35.7, PO2 75.7, bicarbonate 24.1. Slight respiratory alkalosis.  PFT 06/15/2013-mild obstructive airways disease with insignificant response to bronchodilator, slight air trapping, diffusion severely reduced. FEC 2.88/71%, FEV1 2.28/76%, FEV1/FVC 0.79/105%, FEF 25-75% 1.63/66%. RV/TLC 128%, DLCO 47% Reduced diffusion capacity is nonspecific and may just reflect obesity. Obesity and deconditioning contribute to his dyspnea. There may be minimal fixed obstructive airways disease. Plan-recommend a real effort at steady walking and some weight loss

## 2013-08-06 ENCOUNTER — Ambulatory Visit (INDEPENDENT_AMBULATORY_CARE_PROVIDER_SITE_OTHER): Payer: Medicare HMO | Admitting: Family Medicine

## 2013-08-06 ENCOUNTER — Emergency Department (HOSPITAL_COMMUNITY): Payer: Medicare HMO

## 2013-08-06 ENCOUNTER — Inpatient Hospital Stay (HOSPITAL_COMMUNITY)
Admission: EM | Admit: 2013-08-06 | Discharge: 2013-08-09 | DRG: 178 | Disposition: A | Discharge status: Home / Self Care | Payer: Medicare HMO | Attending: Internal Medicine | Admitting: Internal Medicine

## 2013-08-06 ENCOUNTER — Encounter: Payer: Self-pay | Admitting: Family Medicine

## 2013-08-06 ENCOUNTER — Encounter (HOSPITAL_COMMUNITY): Payer: Self-pay | Admitting: Emergency Medicine

## 2013-08-06 VITALS — BP 112/72 | HR 116 | Temp 99.0°F | Ht 72.0 in | Wt 255.0 lb

## 2013-08-06 DIAGNOSIS — E782 Mixed hyperlipidemia: Secondary | ICD-10-CM | POA: Diagnosis present

## 2013-08-06 DIAGNOSIS — T451X5A Adverse effect of antineoplastic and immunosuppressive drugs, initial encounter: Secondary | ICD-10-CM | POA: Diagnosis present

## 2013-08-06 DIAGNOSIS — G4733 Obstructive sleep apnea (adult) (pediatric): Secondary | ICD-10-CM | POA: Diagnosis present

## 2013-08-06 DIAGNOSIS — R062 Wheezing: Secondary | ICD-10-CM

## 2013-08-06 DIAGNOSIS — J189 Pneumonia, unspecified organism: Secondary | ICD-10-CM | POA: Diagnosis present

## 2013-08-06 DIAGNOSIS — I10 Essential (primary) hypertension: Secondary | ICD-10-CM

## 2013-08-06 DIAGNOSIS — J4489 Other specified chronic obstructive pulmonary disease: Secondary | ICD-10-CM | POA: Insufficient documentation

## 2013-08-06 DIAGNOSIS — I509 Heart failure, unspecified: Secondary | ICD-10-CM | POA: Diagnosis present

## 2013-08-06 DIAGNOSIS — C9000 Multiple myeloma not having achieved remission: Secondary | ICD-10-CM

## 2013-08-06 DIAGNOSIS — G4700 Insomnia, unspecified: Secondary | ICD-10-CM | POA: Diagnosis present

## 2013-08-06 DIAGNOSIS — K219 Gastro-esophageal reflux disease without esophagitis: Secondary | ICD-10-CM | POA: Diagnosis present

## 2013-08-06 DIAGNOSIS — J449 Chronic obstructive pulmonary disease, unspecified: Secondary | ICD-10-CM | POA: Insufficient documentation

## 2013-08-06 DIAGNOSIS — D6481 Anemia due to antineoplastic chemotherapy: Secondary | ICD-10-CM | POA: Diagnosis present

## 2013-08-06 DIAGNOSIS — D6959 Other secondary thrombocytopenia: Secondary | ICD-10-CM | POA: Diagnosis present

## 2013-08-06 DIAGNOSIS — M129 Arthropathy, unspecified: Secondary | ICD-10-CM | POA: Diagnosis present

## 2013-08-06 DIAGNOSIS — Z79899 Other long term (current) drug therapy: Secondary | ICD-10-CM

## 2013-08-06 DIAGNOSIS — J690 Pneumonitis due to inhalation of food and vomit: Principal | ICD-10-CM | POA: Diagnosis present

## 2013-08-06 DIAGNOSIS — D72819 Decreased white blood cell count, unspecified: Secondary | ICD-10-CM | POA: Diagnosis present

## 2013-08-06 DIAGNOSIS — R0609 Other forms of dyspnea: Secondary | ICD-10-CM

## 2013-08-06 DIAGNOSIS — E119 Type 2 diabetes mellitus without complications: Secondary | ICD-10-CM | POA: Diagnosis present

## 2013-08-06 DIAGNOSIS — R509 Fever, unspecified: Secondary | ICD-10-CM | POA: Insufficient documentation

## 2013-08-06 DIAGNOSIS — R0989 Other specified symptoms and signs involving the circulatory and respiratory systems: Secondary | ICD-10-CM | POA: Insufficient documentation

## 2013-08-06 DIAGNOSIS — R0902 Hypoxemia: Secondary | ICD-10-CM | POA: Diagnosis present

## 2013-08-06 DIAGNOSIS — R7981 Abnormal blood-gas level: Secondary | ICD-10-CM

## 2013-08-06 DIAGNOSIS — N400 Enlarged prostate without lower urinary tract symptoms: Secondary | ICD-10-CM | POA: Diagnosis present

## 2013-08-06 DIAGNOSIS — Z8711 Personal history of peptic ulcer disease: Secondary | ICD-10-CM

## 2013-08-06 DIAGNOSIS — I5030 Unspecified diastolic (congestive) heart failure: Secondary | ICD-10-CM | POA: Diagnosis present

## 2013-08-06 DIAGNOSIS — Z87891 Personal history of nicotine dependence: Secondary | ICD-10-CM

## 2013-08-06 DIAGNOSIS — N289 Disorder of kidney and ureter, unspecified: Secondary | ICD-10-CM | POA: Diagnosis present

## 2013-08-06 DIAGNOSIS — Z9221 Personal history of antineoplastic chemotherapy: Secondary | ICD-10-CM

## 2013-08-06 DIAGNOSIS — Z7982 Long term (current) use of aspirin: Secondary | ICD-10-CM

## 2013-08-06 DIAGNOSIS — R0602 Shortness of breath: Secondary | ICD-10-CM

## 2013-08-06 DIAGNOSIS — G4730 Sleep apnea, unspecified: Secondary | ICD-10-CM

## 2013-08-06 DIAGNOSIS — Z880 Allergy status to penicillin: Secondary | ICD-10-CM

## 2013-08-06 LAB — HEPATIC FUNCTION PANEL
ALBUMIN: 3.5 g/dL (ref 3.5–5.2)
ALK PHOS: 46 U/L (ref 39–117)
ALT: 14 U/L (ref 0–53)
AST: 33 U/L (ref 0–37)
Bilirubin, Direct: <0.2 mg/dL (ref 0.0–0.3)
Total Bilirubin: 0.4 mg/dL (ref 0.3–1.2)
Total Protein: 6.5 g/dL (ref 6.0–8.3)

## 2013-08-06 LAB — CBC WITH DIFFERENTIAL/PLATELET
BASOS ABS: 0 10*3/uL (ref 0.0–0.1)
Basophils Relative: 1 % (ref 0–1)
EOS PCT: 3 % (ref 0–5)
Eosinophils Absolute: 0.1 10*3/uL (ref 0.0–0.7)
HCT: 37 % — ABNORMAL LOW (ref 39.0–52.0)
Hemoglobin: 13 g/dL (ref 13.0–17.0)
Lymphocytes Relative: 28 % (ref 12–46)
Lymphs Abs: 1 10*3/uL (ref 0.7–4.0)
MCH: 29 pg (ref 26.0–34.0)
MCHC: 35.1 g/dL (ref 30.0–36.0)
MCV: 82.4 fL (ref 78.0–100.0)
MONO ABS: 0.4 10*3/uL (ref 0.1–1.0)
Monocytes Relative: 12 % (ref 3–12)
Neutro Abs: 2.1 10*3/uL (ref 1.7–7.7)
Neutrophils Relative %: 58 % (ref 43–77)
Platelets: 112 10*3/uL — ABNORMAL LOW (ref 150–400)
RBC: 4.49 MIL/uL (ref 4.22–5.81)
RDW: 16.4 % — AB (ref 11.5–15.5)
WBC: 3.6 10*3/uL — AB (ref 4.0–10.5)

## 2013-08-06 LAB — POCT INFLUENZA A/B
Influenza A, POC: NEGATIVE
Influenza B, POC: NEGATIVE

## 2013-08-06 LAB — BASIC METABOLIC PANEL
BUN: 12 mg/dL (ref 6–23)
CALCIUM: 8.7 mg/dL (ref 8.4–10.5)
CO2: 24 meq/L (ref 19–32)
CREATININE: 0.99 mg/dL (ref 0.50–1.35)
Chloride: 102 mEq/L (ref 96–112)
GFR calc Af Amer: >90 mL/min (ref >90)
GFR calc non Af Amer: 78 mL/min — ABNORMAL LOW (ref >90)
Glucose, Bld: 100 mg/dL — ABNORMAL HIGH (ref 70–99)
Potassium: 4 mEq/L (ref 3.7–5.3)
Sodium: 139 mEq/L (ref 137–147)

## 2013-08-06 LAB — D-DIMER, QUANTITATIVE: D-Dimer, Quant: 1.29 ug/mL-FEU — ABNORMAL HIGH (ref 0.00–0.48)

## 2013-08-06 LAB — URINALYSIS, ROUTINE W REFLEX MICROSCOPIC
Glucose, UA: NEGATIVE mg/dL
HGB URINE DIPSTICK: NEGATIVE
KETONES UR: NEGATIVE mg/dL
Leukocytes, UA: NEGATIVE
Nitrite: NEGATIVE
Protein, ur: NEGATIVE mg/dL
Specific Gravity, Urine: 1.025 (ref 1.005–1.030)
Urobilinogen, UA: 0.2 mg/dL (ref 0.0–1.0)
pH: 5.5 (ref 5.0–8.0)

## 2013-08-06 LAB — LIPASE, BLOOD: Lipase: 42 U/L (ref 11–59)

## 2013-08-06 LAB — TROPONIN I

## 2013-08-06 MED ORDER — SODIUM CHLORIDE 0.9 % IV BOLUS (SEPSIS)
1000.0000 mL | Freq: Once | INTRAVENOUS | Status: AC
  Administered 2013-08-06: 1000 mL via INTRAVENOUS

## 2013-08-06 MED ORDER — ALBUTEROL SULFATE (2.5 MG/3ML) 0.083% IN NEBU
5.0000 mg | INHALATION_SOLUTION | Freq: Once | RESPIRATORY_TRACT | Status: AC
  Administered 2013-08-06: 5 mg via RESPIRATORY_TRACT
  Filled 2013-08-06: qty 6

## 2013-08-06 MED ORDER — IOHEXOL 300 MG/ML  SOLN
100.0000 mL | Freq: Once | INTRAMUSCULAR | Status: AC | PRN
  Administered 2013-08-06: 100 mL via INTRAVENOUS

## 2013-08-06 MED ORDER — IOHEXOL 300 MG/ML  SOLN: 50.0000 mL | Freq: Once | INTRAMUSCULAR | Status: AC | PRN

## 2013-08-06 MED ORDER — IOHEXOL 350 MG/ML SOLN
100.0000 mL | Freq: Once | INTRAVENOUS | Status: AC | PRN
  Administered 2013-08-06: 100 mL via INTRAVENOUS

## 2013-08-06 NOTE — ED Notes (Signed)
PA at bedside.

## 2013-08-06 NOTE — ED Notes (Signed)
Pt transported to XRAY °

## 2013-08-06 NOTE — ED Notes (Signed)
Respiratory called for evaluation and neb treatement

## 2013-08-06 NOTE — Progress Notes (Signed)
Patient ID: [name], male   DOB: May 06, 1936, 78 y.o.   MRN: [id number] SUBJECTIVE: CC: Chief Complaint  Patient presents with  . Fever  . Cough  . Abdominal Pain  . Fatigue  . Otalgia    HPI: Patient on chemotherapy for Multiple myeloma, under Dr Carin Hock care. Caretaker companion has walking Pneumonia. He spiked a high fever with chills, shabking chest congestion wheezing difficulty breathing and cough and SOB last night, temperature better this am. But congested and coughing and wheezing on and off. Epigastric area also has been sore, Feels fatigued.   Past Medical History  Diagnosis Date  . GERD (gastroesophageal reflux disease)   . Essential hypertension, benign   . BPH (benign prostatic hyperplasia)   . Lumbar herniated disc     L4-5  . Hyperlipidemia   . Sleep apnea   . Multiple myeloma 02/19/2008  . Arthritis   . Diverticulosis   . Peptic ulcer disease   . Tubular adenoma 02/16/2008    Dr. Silvano Rusk   Past Surgical History  Procedure Laterality Date  . Neck surgery    . Lumbar laminectomy/decompression microdiscectomy  11/01/2011    Procedure: LUMBAR LAMINECTOMY/DECOMPRESSION MICRODISCECTOMY 2 LEVELS;  Surgeon: Charlie Pitter, MD;  Location: [facility];  Service: Neurosurgery;  Laterality: Right;  Lumbar Laminectomy/Microdiscectomy Decompression Lumbar Three-Four, Lumbar Four-Five   . Colonoscopy    . Hemorrhoid banding  2014   History   Social History  . Marital Status: Married    Spouse Name: N/A    Number of Children: 4  . Years of Education: N/A   Occupational History  . retired-disabled-Construction    Social History Main Topics  . Smoking status: Former Smoker -- 2.00 packs/day for 20 years    Types: Cigarettes    Quit date: 07/12/1990  . Smokeless tobacco: Never Used  . Alcohol Use: No  . Drug Use: No  . Sexual Activity: Not on file   Other Topics Concern  . Not on file   Social History Narrative   Pt was adopted.   Married   International aid/development worker   Family History  Problem Relation Age of Onset  . Adopted: Yes   Current Outpatient Prescriptions on File Prior to Visit  Medication Sig Dispense Refill  . acyclovir (ZOVIRAX) 400 MG tablet TAKE 1 TABLET (400 MG TOTAL) BY MOUTH 2 (TWO) TIMES DAILY.  60 tablet  2  . amLODipine (NORVASC) 2.5 MG tablet Take 1 tablet (2.5 mg total) by mouth daily.  30 tablet  3  . aspirin 325 MG EC tablet Take 1 tablet (325 mg total) by mouth 2 (two) times daily. Do not take again until March 20, 2013      . atorvastatin (LIPITOR) 20 MG tablet TAKE 1 TABLET (20 MG TOTAL) BY MOUTH DAILY.  30 tablet  2  . cyclophosphamide (CYTOXAN) 50 MG tablet Take 12 tablets (600 mg total) by mouth once a week. Give on an empty stomach 1 hour before or 2 hours after meals.  48 tablet  1  . dexamethasone (DECADRON) 4 MG tablet TAKE 5 TABLETS ON THURSDAYS WHEN YOU DONT HAVE CHEMO  20 tablet  2  . fluticasone (FLONASE) 50 MCG/ACT nasal spray Place 2 sprays into both nostrils daily.  16 g  6  . lactulose (CHRONULAC) 10 GM/15ML solution Take 15 mLs (10 g total) by mouth 2 (two) times daily as needed.  500 mL  0  . NITROSTAT 0.4 MG SL tablet Place  0.4 mg under the tongue as needed.      Marland Kitchen omeprazole (PRILOSEC) 20 MG capsule TAKE 1 CAPSULE (20 MG TOTAL) BY MOUTH DAILY.  30 capsule  5  . polyethylene glycol (MIRALAX / GLYCOLAX) packet Take 17 g by mouth 2 (two) times daily.       . potassium chloride SA (KLOR-CON M20) 20 MEQ tablet Take 1 tablet (20 mEq total) by mouth daily.  30 tablet  2  . PRESCRIPTION MEDICATION Inject as directed every 7 (seven) days. Velcade $RemoveBefor'3mg'[id number]$  injection q7d on Thursdays, skips every 4th week.      . prochlorperazine (COMPAZINE) 10 MG tablet Take 10 mg by mouth every 6 (six) hours as needed (for nausea).      . tamsulosin (FLOMAX) 0.4 MG CAPS capsule TAKE 1 CAPSULE (0.4 MG TOTAL) BY MOUTH 2 (TWO) TIMES DAILY WITH A MEAL.  60 capsule  5  . zolpidem (AMBIEN) 5 MG tablet Take 5 mg by mouth at bedtime as  needed.        No current facility-administered medications on file prior to visit.   Allergies  Allergen Reactions  . Penicillins Hives and Rash   Immunization History  Administered Date(s) Administered  . Influenza Split 04/23/2011, 04/11/2012  . Influenza,inj,Quad PF,36+ Mos 04/09/2013  . Tdap 07/13/2007   Prior to Admission medications   Medication Sig Start Date End Date Taking? Authorizing Provider  acyclovir (ZOVIRAX) 400 MG tablet TAKE 1 TABLET (400 MG TOTAL) BY MOUTH 2 (TWO) TIMES DAILY. 07/10/13   Ladell Pier, MD  amLODipine (NORVASC) 2.5 MG tablet Take 1 tablet (2.5 mg total) by mouth daily. 05/11/13   Vernie Shanks, MD  aspirin 325 MG EC tablet Take 1 tablet (325 mg total) by mouth 2 (two) times daily. Do not take again until March 20, 2013 03/06/13   Gatha Mayer, MD  atorvastatin (LIPITOR) 20 MG tablet TAKE 1 TABLET (20 MG TOTAL) BY MOUTH DAILY. 05/29/13   Vernie Shanks, MD  cyclophosphamide (CYTOXAN) 50 MG tablet Take 12 tablets (600 mg total) by mouth once a week. Give on an empty stomach 1 hour before or 2 hours after meals. 07/16/13   Ladell Pier, MD  dexamethasone (DECADRON) 4 MG tablet TAKE 5 TABLETS ON THURSDAYS WHEN YOU DONT HAVE CHEMO 07/09/13   Ladell Pier, MD  fluticasone [facility]) 50 MCG/ACT nasal spray Place 2 sprays into both nostrils daily. 06/11/13   Vernie Shanks, MD  lactulose (CHRONULAC) 10 GM/15ML solution Take 15 mLs (10 g total) by mouth 2 (two) times daily as needed. 07/31/13   Owens Shark, NP  NITROSTAT 0.4 MG SL tablet Place 0.4 mg under the tongue as needed. 12/07/12   Historical Provider, MD  omeprazole (PRILOSEC) 20 MG capsule TAKE 1 CAPSULE (20 MG TOTAL) BY MOUTH DAILY. 03/24/13   Vernie Shanks, MD  polyethylene glycol (MIRALAX / Floria Raveling) packet Take 17 g by mouth 2 (two) times daily.     Historical Provider, MD  potassium chloride SA (KLOR-CON M20) 20 MEQ tablet Take 1 tablet (20 mEq total) by mouth daily. 06/26/13   Vernie Shanks, MD  PRESCRIPTION MEDICATION Inject as directed every 7 (seven) days. Velcade $RemoveBefor'3mg'[id number]$  injection q7d on Thursdays, skips every 4th week.    Historical Provider, MD  prochlorperazine (COMPAZINE) 10 MG tablet Take 10 mg by mouth every 6 (six) hours as needed (for nausea).    Historical Provider, MD  tamsulosin (FLOMAX) 0.4 MG CAPS capsule  TAKE 1 CAPSULE (0.4 MG TOTAL) BY MOUTH 2 (TWO) TIMES DAILY WITH A MEAL. 06/04/13   Vernie Shanks, MD  zolpidem (AMBIEN) 5 MG tablet Take 5 mg by mouth at bedtime as needed.  04/05/13   Historical Provider, MD     ROS: As above in the HPI. All other systems are stable or negative.  OBJECTIVE: APPEARANCE:  Patient in no acute distress.The patient appeared well nourished and normally developed. Acyanotic. Waist: VITAL SIGNS:BP 112/72  Pulse 116  Temp(Src) 99 F (37.2 C) (Oral)  Ht 6' (1.829 m)  Wt 255 lb (115.667 kg)  BMI 34.58 kg/m2  SpO2 95% Obese AAM. Better O2 sat on O2 came up to 95% Patient congested  SKIN: warm and  Dry without overt rashes, tattoos and scars  HEAD and Neck: without JVD, Head and scalp: normal Eyes:No scleral icterus. Fundi normal, eye movements normal. Ears: Auricle normal, canal normal, Tympanic membranes normal, insufflation normal. Nose: coryza Throat: throat red Neck & thyroid: normal  CHEST & LUNGS: Chest wall: normal Lungs: Coarse bs. Few scatterred wheezes. Few basilar crackles.   CVS: Reveals the PMI to be normally located. Regular rhythm, First and Second Heart sounds are normal,  absence of murmurs, rubs or gallops. Peripheral vasculature: Radial pulses: normal Dorsal pedis pulses: normal Posterior pulses: normal  ABDOMEN:  Appearance: Obese Benign, no organomegaly, no masses, no Abdominal Aortic enlargement. No Guarding , no rebound. No Bruits. Bowel sounds: normal  RECTAL: N/A GU: N/A  EXTREMETIES: nonedematous.  ASSESSMENT:  Fever, unspecified - Plan: POCT Influenza A/B  Multiple  myeloma  Essential hypertension, benign  Chest congestion  Low oxygen saturation  Wheezing  PLAN:  Orders Placed This Encounter  Procedures  . POCT Influenza A/B  informed Charge nurse at [facility] in regards to patient needing work up in the ED setting in view of his Chemotherapeutic status and treated for MM, and having fever and chest congestion and abdominal pain. No orders of the defined types were placed in this encounter.   There are no discontinued medications. Return if symptoms worsen or fail to improve, for referred to the ED at [facility].  Eldra Word P. Jacelyn Grip, M.D.    Results for orders placed in visit on 08/06/13  POCT INFLUENZA A/B      Result Value Range   Influenza A, POC Negative     Influenza B, POC Negative     Ramadan Couey P. Jacelyn Grip, M.D.

## 2013-08-06 NOTE — ED Notes (Signed)
Pt c/o abd pain; fever last night that went; left sided lower abd pain; sent by physician for eval; pt takes chemo-last dose Thurs

## 2013-08-06 NOTE — ED Provider Notes (Signed)
CSN: [id number]     Arrival date & time 08/06/13  1327 History   First MD Initiated Contact with Patient 08/06/13 1748     Chief Complaint  Patient presents with  . Abdominal Pain   (Consider location/radiation/quality/duration/timing/severity/associated sxs/prior Treatment) Patient is a 78 y.o. male presenting with abdominal pain. The history is provided by the patient. No language interpreter was used.  Abdominal Pain Associated symptoms: chest pain, constipation, cough and shortness of breath   Associated symptoms: no chills, no fever, no nausea and no vomiting   [name] is a 78 year old male with past medical history of GERD, BPH, hyperlipidemia, arthritis, diverticulosis, peptic ulcer disease, multiple myeloma diagnosed in 2009 - currently on chemotherapy every Thursday for the past year being followed by Dr. Betsy Coder - presenting to the emergency department with cough, wheezing, headache, fevers that has been ongoing for the past 2 weeks. Patient reported that the cough is dry most of the time. Reported wheezing while in ED setting. Reported headache. Stated that when he has coughing fits he becomes extremely dizzy and almost blacked out, denied actually fainting or having a syncopal episode. Stated that last night he had heavy sweats to the point where he needed to change his shirt. Patient reported that he was seen and assessed by his primary care provider, Dr. Yaakov Guthrie approximately 2 weeks ago he was given antibiotics and cough syrup with negative relief. Patient reported that he spoke with his primary care provider today regarding similar symptoms that he was checked out for 2 weeks ago-Dr. Yaakov Guthrie recommended patient to come to the emergency department to be assessed. Patient reported that he's been having abdominal pain this past week described as a "constipation feeling"-when asked regarding last bowel movement, reported last bowel movement was approximately 2 days ago.  Stated that he's been taking MiraLax with negative relief. Patient reported that he's been noticing swelling to his lower extremities over the past week. Denied melena, hematochezia, urinary symptoms, chest pain, changes to appetite. PCP Dr. Yaakov Guthrie  Past Medical History  Diagnosis Date  . GERD (gastroesophageal reflux disease)   . Essential hypertension, benign   . BPH (benign prostatic hyperplasia)   . Lumbar herniated disc     L4-5  . Hyperlipidemia   . Sleep apnea   . Multiple myeloma 02/19/2008  . Arthritis   . Diverticulosis   . Peptic ulcer disease   . Tubular adenoma 02/16/2008    Dr. Silvano Rusk   Past Surgical History  Procedure Laterality Date  . Neck surgery    . Lumbar laminectomy/decompression microdiscectomy  11/01/2011    Procedure: LUMBAR LAMINECTOMY/DECOMPRESSION MICRODISCECTOMY 2 LEVELS;  Surgeon: Charlie Pitter, MD;  Location: [facility];  Service: Neurosurgery;  Laterality: Right;  Lumbar Laminectomy/Microdiscectomy Decompression Lumbar Three-Four, Lumbar Four-Five   . Colonoscopy    . Hemorrhoid banding  2014   Family History  Problem Relation Age of Onset  . Adopted: Yes   History  Substance Use Topics  . Smoking status: Former Smoker -- 2.00 packs/day for 20 years    Types: Cigarettes    Quit date: 07/12/1990  . Smokeless tobacco: Never Used  . Alcohol Use: No    Review of Systems  Constitutional: Negative for fever, chills and appetite change.  Respiratory: Positive for cough, shortness of breath and wheezing.   Cardiovascular: Positive for chest pain.  Gastrointestinal: Positive for abdominal pain and constipation. Negative for nausea, vomiting and blood in stool.  Genitourinary: Negative for  decreased urine volume.  Musculoskeletal: Negative for back pain, neck pain and neck stiffness.  Neurological: Positive for weakness. Negative for dizziness.  All other systems reviewed and are negative.    Allergies  Penicillins  Home  Medications   Current Outpatient Rx  Name  Route  Sig  Dispense  Refill  . acyclovir (ZOVIRAX) 400 MG tablet   Oral   Take 400 mg by mouth 2 (two) times daily.         Marland Kitchen amLODipine (NORVASC) 2.5 MG tablet   Oral   Take 1 tablet (2.5 mg total) by mouth daily.   30 tablet   3   . aspirin 325 MG EC tablet   Oral   Take 1 tablet (325 mg total) by mouth 2 (two) times daily. Do not take again until March 20, 2013         . atorvastatin (LIPITOR) 20 MG tablet   Oral   Take 20 mg by mouth daily.         . cyclophosphamide (CYTOXAN) 50 MG tablet   Oral   Take 600 mg by mouth. Every Thursday as directed.  Give on an empty stomach 1 hour before or 2 hours after meals.         Marland Kitchen dexamethasone (DECADRON) 4 MG tablet   Oral   Take 20 mg by mouth. Take 5 tablets on Thursdays when you are not getting chemotherapy.         . fluticasone (FLONASE) 50 MCG/ACT nasal spray   Each Nare   Place 2 sprays into both nostrils daily.   16 g   6   . lactulose (CHRONULAC) 10 GM/15ML solution   Oral   Take 10 g by mouth 2 (two) times daily as needed.         Marland Kitchen NITROSTAT 0.4 MG SL tablet   Sublingual   Place 0.4 mg under the tongue as needed for chest pain.          Marland Kitchen omeprazole (PRILOSEC) 20 MG capsule   Oral   Take 20 mg by mouth daily.         . polyethylene glycol (MIRALAX / GLYCOLAX) packet   Oral   Take 17 g by mouth 2 (two) times daily.          . potassium chloride SA (KLOR-CON M20) 20 MEQ tablet   Oral   Take 1 tablet (20 mEq total) by mouth daily.   30 tablet   2   . PRESCRIPTION MEDICATION   Injection   Inject as directed every 7 (seven) days. Velcade $RemoveBefor'3mg'[id number]$  injection q7d on Thursdays, skips every 4th week.         . prochlorperazine (COMPAZINE) 10 MG tablet   Oral   Take 10 mg by mouth every 6 (six) hours as needed (for nausea).         . tamsulosin (FLOMAX) 0.4 MG CAPS capsule   Oral   Take 0.4 mg by mouth 2 (two) times daily. With a  Meal.          . zolpidem (AMBIEN) 5 MG tablet   Oral   Take 5 mg by mouth at bedtime as needed for sleep.           BP 140/78  Pulse 85  Temp(Src) 98 F (36.7 C) (Oral)  Resp 21  SpO2 96% Physical Exam  Nursing note and vitals reviewed. Constitutional: He is oriented to person, place, and time. He appears well-developed  and well-nourished. No distress.  HENT:  Head: Normocephalic and atraumatic.  Mouth/Throat: Oropharynx is clear and moist. No oropharyngeal exudate.  Eyes: Conjunctivae and EOM are normal. Pupils are equal, round, and reactive to light. Right eye exhibits no discharge. Left eye exhibits no discharge.  Neck: Normal range of motion. Neck supple.  Negative neck stiffness Negative nuchal rigidity Negative cervical lymphadenopathy Negative meningeal signs  Cardiovascular: Normal rate, regular rhythm and normal heart sounds.   Cap refill less than 3 seconds Swelling localized to bilateral lower extremities without pitting edema noted  Pulmonary/Chest: Effort normal. He has wheezes. He has no rales. He exhibits no tenderness.  Decreased breath sounds upper and lower lobes bilaterally Expiratory wheezes to lower lobes bilaterally  Abdominal: Soft. Normal appearance and bowel sounds are normal. He exhibits no distension. There is tenderness in the epigastric area. There is no rebound and no guarding.    Discomfort upon palpation to epigastric region Negative Murphy's sign Negative Rovsing's Negative McBurney's point Negative acute abdominal signs, negative peritoneal signs  Musculoskeletal: Normal range of motion.  Full ROM to upper and lower extremities without difficulty noted, negative ataxia noted.  Neurological: He is alert and oriented to person, place, and time. He exhibits normal muscle tone. Coordination normal.  Cranial nerves III-XII grossly intact Strength 5+/5+ to upper and lower extremities bilaterally with resistance applied, equal distribution noted  Skin:  Skin is warm and dry. No rash noted. He is not diaphoretic. No erythema.  Psychiatric: He has a normal mood and affect. His behavior is normal. Thought content normal.    ED Course  Procedures (including critical care time)  9:03 PM This provider spoke with Radiology, Dr. Brigitte Pulse, discussed concern regarding possible PE with patient since pulse ox has been dropping and patient has history of multiple myeloma. Discussed 100 cc's of contrast administered to the patient. Radiologist recommended to wait an hour at least and for fluids to be administered before the CT angio can be performed.   9:17 PM This provider re-assessed the patient. Lungs clear to auscultation to upper and lower lobes bilaterally.  10:39 PM This provider spoke with Dr. Reesa Chew, radiologist, regarding plan for CT angiogram chest. According to radiologist, patient is able to get test performed at this time.  1:33 AM Patient ambulated while on room air with pulse ox 84% with associated symptoms of shortness of breath.   2:13 AM This provider spoke with Dr. Reece Levy, [facility] - discussed case, history, presentation, labs, imaging. Patient to be admitted to the hospital to Telemetry as inpatient for hypoxia and aspiration pneumonitis. Dr. Reece Levy recommended the patient to be started on Vancomycin and Cefepime. Patient is allergic to penicillin. Patient to be started on vancomycin.  2:32 AM This provider discussed plan for admission with patient. Patient agreed to plan for admission.   Results for orders placed during the hospital encounter of 08/06/13  CBC WITH DIFFERENTIAL      Result Value Range   WBC 3.6 (*) 4.0 - 10.5 K/uL   RBC 4.49  4.22 - 5.81 MIL/uL   Hemoglobin 13.0  13.0 - 17.0 g/dL   HCT 37.0 (*) 39.0 - 52.0 %   MCV 82.4  78.0 - 100.0 fL   MCH 29.0  26.0 - 34.0 pg   MCHC 35.1  30.0 - 36.0 g/dL   RDW 16.4 (*) 11.5 - 15.5 %   Platelets 112 (*) 150 - 400 K/uL   Neutrophils Relative % 58  43 - 77  %  Neutro Abs 2.1  1.7 - 7.7 K/uL   Lymphocytes Relative 28  12 - 46 %   Lymphs Abs 1.0  0.7 - 4.0 K/uL   Monocytes Relative 12  3 - 12 %   Monocytes Absolute 0.4  0.1 - 1.0 K/uL   Eosinophils Relative 3  0 - 5 %   Eosinophils Absolute 0.1  0.0 - 0.7 K/uL   Basophils Relative 1  0 - 1 %   Basophils Absolute 0.0  0.0 - 0.1 K/uL  BASIC METABOLIC PANEL      Result Value Range   Sodium 139  137 - 147 mEq/L   Potassium 4.0  3.7 - 5.3 mEq/L   Chloride 102  96 - 112 mEq/L   CO2 24  19 - 32 mEq/L   Glucose, Bld 100 (*) 70 - 99 mg/dL   BUN 12  6 - 23 mg/dL   Creatinine, Ser 0.99  0.50 - 1.35 mg/dL   Calcium 8.7  8.4 - 10.5 mg/dL   GFR calc non Af Amer 78 (*) >90 mL/min   GFR calc Af Amer >90  >90 mL/min  TROPONIN I      Result Value Range   Troponin I <0.30  <0.30 ng/mL  URINALYSIS, ROUTINE W REFLEX MICROSCOPIC      Result Value Range   Color, Urine AMBER (*) YELLOW   APPearance CLEAR  CLEAR   Specific Gravity, Urine 1.025  1.005 - 1.030   pH 5.5  5.0 - 8.0   Glucose, UA NEGATIVE  NEGATIVE mg/dL   Hgb urine dipstick NEGATIVE  NEGATIVE   Bilirubin Urine SMALL (*) NEGATIVE   Ketones, ur NEGATIVE  NEGATIVE mg/dL   Protein, ur NEGATIVE  NEGATIVE mg/dL   Urobilinogen, UA 0.2  0.0 - 1.0 mg/dL   Nitrite NEGATIVE  NEGATIVE   Leukocytes, UA NEGATIVE  NEGATIVE  LIPASE, BLOOD      Result Value Range   Lipase 42  11 - 59 U/L  D-DIMER, QUANTITATIVE      Result Value Range   D-Dimer, Quant 1.29 (*) 0.00 - 0.48 ug/mL-FEU  HEPATIC FUNCTION PANEL      Result Value Range   Total Protein 6.5  6.0 - 8.3 g/dL   Albumin 3.5  3.5 - 5.2 g/dL   AST 33  0 - 37 U/L   ALT 14  0 - 53 U/L   Alkaline Phosphatase 46  39 - 117 U/L   Total Bilirubin 0.4  0.3 - 1.2 mg/dL   Bilirubin, Direct <0.2  0.0 - 0.3 mg/dL   Indirect Bilirubin NOT CALCULATED  0.3 - 0.9 mg/dL    Ct Angio Chest Pe W/cm &/or Wo Cm  08/07/2013   CLINICAL DATA:  Multiple myeloma, drop in oxygen saturation. Epigastric pain.  EXAM: CT  ANGIOGRAPHY CHEST WITH CONTRAST  TECHNIQUE: Multidetector CT imaging of the chest was performed using the standard protocol during bolus administration of intravenous contrast. Multiplanar CT image reconstructions including MIPs were obtained to evaluate the vascular anatomy.  CONTRAST:  1 OMNIPAQUE IOHEXOL 300 MG/ML SOLN, 135mL OMNIPAQUE IOHEXOL 350 MG/ML SOLN  COMPARISON:  08/06/2013 chest radiograph and abdominal pelvic CT  FINDINGS: The pulmonary arterial branch vessels are patent.  Normal caliber aorta with mild scattered atherosclerotic disease.  Normal heart size.  Coronary artery calcifications.  Trace pericardial fluid.  No pleural effusion.  Mucus/debris within the right main bronchus and bilateral lower and middle lobe bronchi. Bilateral lower lobe peribronchial thickening. Mild right lower  lobe opacity. Mild peripheral reticular opacities. Calcified granuloma left upper and lower lobe.  Upper abdominal images show a 4 cm left hepatic lobe lesion with a peripheral nodular hypervascular enhancement. There may be a smaller lesion within the right hepatic lobe on image 65 series 4.  Multilevel degenerative change. Partially imaged cervical fusion hardware. No acute osseous finding.  Review of the MIP images confirms the above findings.  IMPRESSION: No pulmonary embolism.  Lower lobe peribronchial thickening and debris within right greater than left lower lobe bronchi. May reflect bronchitis or aspiration pneumonitis.  4 cm left hepatic lobe lesion is favored to reflect a hemangioma. There may be a smaller right hepatic lobe hemangioma versus blush of variant perfusion.   Electronically Signed   By: Carlos Levering M.D.   On: 08/07/2013 00:50   Ct Abdomen Pelvis W Contrast  08/06/2013   CLINICAL DATA:  Left-sided abdominal pain and fever. History of multiple myeloma.  EXAM: CT ABDOMEN AND PELVIS WITH CONTRAST  TECHNIQUE: Multidetector CT imaging of the abdomen and pelvis was performed using the standard  protocol following bolus administration of intravenous contrast.  CONTRAST:  125mL OMNIPAQUE IOHEXOL 300 MG/ML  SOLN  COMPARISON:  DG ABD ACUTE W/CHEST dated 02/10/2013; CT ABD/PELV WO CM dated 10/09/2011; US RENAL dated 03/06/2010; MR L SPINE WO/W CM dated 03/23/2012  FINDINGS: Stable hemangioma of the superior left lobe of the liver measures approximately 3.4 cm in diameter. The gallbladder, spleen, pancreas and adrenal glands are within normal limits. Hyperdense lesion of the posterior right kidney shows stable size since prior CT, measuring approximately 2.4 cm in greatest diameter. This is likely a complex/ hemorrhagic cyst. No calculi or renal obstruction identified.  Bowel loops are unremarkable in show no evidence of obstruction or inflammation. Diverticulosis of the colon identified without evidence of diverticulitis. The bladder is unremarkable. No enlarged lymph nodes are seen. The abdominal aorta is of normal caliber. No hernias are identified. The bony thorax shows advanced degenerative disc disease of the lumbar spine without focal bony lesions, fractures or subluxations.  IMPRESSION: No acute findings. Stable hemangioma of the left lobe the liver and complex/hemorrhagic cystic lesion of the right kidney.   Electronically Signed   By: Aletta Edouard M.D.   On: 08/06/2013 20:53   Labs Review Labs Reviewed  CBC WITH DIFFERENTIAL - Abnormal; Notable for the following:    WBC 3.6 (*)    HCT 37.0 (*)    RDW 16.4 (*)    Platelets 112 (*)    All other components within normal limits  BASIC METABOLIC PANEL - Abnormal; Notable for the following:    Glucose, Bld 100 (*)    GFR calc non Af Amer 78 (*)    All other components within normal limits  URINALYSIS, ROUTINE W REFLEX MICROSCOPIC - Abnormal; Notable for the following:    Color, Urine AMBER (*)    Bilirubin Urine SMALL (*)    All other components within normal limits  D-DIMER, QUANTITATIVE - Abnormal; Notable for the following:    D-Dimer,  Quant 1.29 (*)    All other components within normal limits  TROPONIN I  LIPASE, BLOOD  HEPATIC FUNCTION PANEL   Imaging Review Dg Chest 2 View  08/06/2013   CLINICAL DATA:  Cough, shortness of breath and dizziness.  EXAM: CHEST - 2 VIEW  COMPARISON:  DG RIBS UNILATERAL W/CHEST*R* dated 07/31/2013; DG CHEST 2V dated 04/09/2013  FINDINGS: Lungs show stable chronic disease. There is no evidence of pulmonary  edema, consolidation, pneumothorax, nodule or pleural fluid. The heart size is normal. The bony thorax shows mild degenerative disease of the thoracic spine.  IMPRESSION: Stable chronic lung disease.   Electronically Signed   By: Aletta Edouard M.D.   On: 08/06/2013 19:26   Ct Angio Chest Pe W/cm &/or Wo Cm  08/07/2013   CLINICAL DATA:  Multiple myeloma, drop in oxygen saturation. Epigastric pain.  EXAM: CT ANGIOGRAPHY CHEST WITH CONTRAST  TECHNIQUE: Multidetector CT imaging of the chest was performed using the standard protocol during bolus administration of intravenous contrast. Multiplanar CT image reconstructions including MIPs were obtained to evaluate the vascular anatomy.  CONTRAST:  1 OMNIPAQUE IOHEXOL 300 MG/ML SOLN, 113mL OMNIPAQUE IOHEXOL 350 MG/ML SOLN  COMPARISON:  08/06/2013 chest radiograph and abdominal pelvic CT  FINDINGS: The pulmonary arterial branch vessels are patent.  Normal caliber aorta with mild scattered atherosclerotic disease.  Normal heart size.  Coronary artery calcifications.  Trace pericardial fluid.  No pleural effusion.  Mucus/debris within the right main bronchus and bilateral lower and middle lobe bronchi. Bilateral lower lobe peribronchial thickening. Mild right lower lobe opacity. Mild peripheral reticular opacities. Calcified granuloma left upper and lower lobe.  Upper abdominal images show a 4 cm left hepatic lobe lesion with a peripheral nodular hypervascular enhancement. There may be a smaller lesion within the right hepatic lobe on image 65 series 4.  Multilevel  degenerative change. Partially imaged cervical fusion hardware. No acute osseous finding.  Review of the MIP images confirms the above findings.  IMPRESSION: No pulmonary embolism.  Lower lobe peribronchial thickening and debris within right greater than left lower lobe bronchi. May reflect bronchitis or aspiration pneumonitis.  4 cm left hepatic lobe lesion is favored to reflect a hemangioma. There may be a smaller right hepatic lobe hemangioma versus blush of variant perfusion.   Electronically Signed   By: Carlos Levering M.D.   On: 08/07/2013 00:50   Ct Abdomen Pelvis W Contrast  08/06/2013   CLINICAL DATA:  Left-sided abdominal pain and fever. History of multiple myeloma.  EXAM: CT ABDOMEN AND PELVIS WITH CONTRAST  TECHNIQUE: Multidetector CT imaging of the abdomen and pelvis was performed using the standard protocol following bolus administration of intravenous contrast.  CONTRAST:  126mL OMNIPAQUE IOHEXOL 300 MG/ML  SOLN  COMPARISON:  DG ABD ACUTE W/CHEST dated 02/10/2013; CT ABD/PELV WO CM dated 10/09/2011; US RENAL dated 03/06/2010; MR L SPINE WO/W CM dated 03/23/2012  FINDINGS: Stable hemangioma of the superior left lobe of the liver measures approximately 3.4 cm in diameter. The gallbladder, spleen, pancreas and adrenal glands are within normal limits. Hyperdense lesion of the posterior right kidney shows stable size since prior CT, measuring approximately 2.4 cm in greatest diameter. This is likely a complex/ hemorrhagic cyst. No calculi or renal obstruction identified.  Bowel loops are unremarkable in show no evidence of obstruction or inflammation. Diverticulosis of the colon identified without evidence of diverticulitis. The bladder is unremarkable. No enlarged lymph nodes are seen. The abdominal aorta is of normal caliber. No hernias are identified. The bony thorax shows advanced degenerative disc disease of the lumbar spine without focal bony lesions, fractures or subluxations.  IMPRESSION: No acute  findings. Stable hemangioma of the left lobe the liver and complex/hemorrhagic cystic lesion of the right kidney.   Electronically Signed   By: Aletta Edouard M.D.   On: 08/06/2013 20:53    EKG Interpretation   None      Date: 08/07/2013  Rate: 82  Rhythm: normal sinus rhythm  QRS Axis: normal  Intervals: normal  ST/T Wave abnormalities: normal  Conduction Disutrbances:none  Narrative Interpretation:   Old EKG Reviewed: unchanged EKG analyzed and reviewed by this provider and attending physician.    MDM   1. Hypoxia   2. Aspiration pneumonitis   3. Multiple myeloma     Medications  iohexol (OMNIPAQUE) 300 MG/ML solution 50 mL (50 mLs Oral Canceled Entry 08/06/13 1841)  albuterol (PROVENTIL) (2.5 MG/3ML) 0.083% nebulizer solution 5 mg (5 mg Nebulization Given 08/06/13 1937)  iohexol (OMNIPAQUE) 300 MG/ML solution 100 mL (100 mLs Intravenous Contrast Given 08/06/13 2027)  sodium chloride 0.9 % bolus 1,000 mL (0 mLs Intravenous Stopped 08/06/13 2301)  iohexol (OMNIPAQUE) 350 MG/ML injection 100 mL (100 mLs Intravenous Contrast Given 08/06/13 2327)   Filed Vitals:   08/07/13 0030 08/07/13 0100 08/07/13 0135 08/07/13 0139  BP: 124/73 140/78    Pulse: 72 75  85  Temp:      TempSrc:      Resp: 20   21  SpO2: 93% 93% 84% 96%    Patient presenting to emergency department with cough, wheezing, nasal congestion, headache that has been ongoing for the past 2 weeks. Reported that with coughing fits he feels extremely dizzy to the point where he almost passes out-reported almost "blacking out." Stated that he was seen and assessed by primary care provider approximately 2 weeks ago and was treated with cough syrup and pills that he cannot recall. Patient reported that he's been having intermittent headaches. Reported abdominal pain for the past week localize the epigastric region described as a "constipation feeling." Stated that his last bowel movement was 2 days ago. Patient reported that  he's been noticing swelling localized to his lower tremors bilaterally. Stated that he's been having difficulty breathing, shortness of breath. Patient has history of multiple myeloma that was diagnosed in 2009 is currently on chemotherapy every Thursday for the past year-[facility], Dr. Betsy Coder.  Alert and oriented. GCS 15. Heart rate and rhythm normal. Lungs noted to have decreased breath sounds bilaterally to upper and lower lobes with wheezes, expiratory, to bilateral lower lobes. Cap refill less than 3 seconds. Radial and DP pulses 2+ bilaterally. Swelling localized to the lower extremities bilaterally-negative pitting edema identified or changes to colors noted. Bowel sounds normoactive in all 4 quadrants. Discomfort upon palpation to the epigastric region. Negative Murphy's sign. Negative Rovsing sign. EKG normal sinus rhythm with 82 bpm - negative ischemic findings noted.  Troponin negative elevation. CBC noted decreased white blood count-3.6. BMP negative findings. Urinalysis negative for nitrates or leukocytosis. Hepatic function negative findings noted. Lipase negative findings noted. Chest xray negative acute findings - chronic lung disease noted. CT abdomen and pelvis negative for acute findings. Elevated d-dimer of 1.29 - patient placed on [place] for oxygen secondary to drop in pulse ox. CT angiogram of chest negative for acute pulmonary embolism. Lower lobe peribronchial thickening noted with right greater than left lower lobe-bronchitis and aspiration pneumonitis identified.  Patient ambulated with pulse ox check noted to be 84% on room air.  Discussed case with [facility] - patient to be admitted to the hospital due to aspiration pneumonitis, hypoxia, with history of multiple myeloma. Patient to be started on antibiotics while in the ED setting. Discussed case with Dr. Reece Levy - patient to be admitted to Telemetry. Patient stable, afebrile. Patient  stable for transfer.   Jamse Mead, PA-C 08/07/13 1119

## 2013-08-06 NOTE — ED Notes (Signed)
Respiratory at bedside.

## 2013-08-06 NOTE — ED Notes (Signed)
Dr Wong called, sending pt from office.   Pt hx of multiple myeloma, taking chemo pills. Over the weekend pt developed fever, coughing, congestion. Epigastric pain last night. In office pt was 90% on room air, 95% on oxygen. Dr Wong reports he has never seen pts O2 stats drop below 92%. md thinks pt needs full work up due to fever. 

## 2013-08-07 ENCOUNTER — Encounter (HOSPITAL_COMMUNITY): Payer: Self-pay | Admitting: Internal Medicine

## 2013-08-07 DIAGNOSIS — J189 Pneumonia, unspecified organism: Secondary | ICD-10-CM | POA: Diagnosis present

## 2013-08-07 DIAGNOSIS — R0602 Shortness of breath: Secondary | ICD-10-CM | POA: Diagnosis present

## 2013-08-07 DIAGNOSIS — C9000 Multiple myeloma not having achieved remission: Secondary | ICD-10-CM

## 2013-08-07 DIAGNOSIS — I5030 Unspecified diastolic (congestive) heart failure: Secondary | ICD-10-CM

## 2013-08-07 DIAGNOSIS — G4733 Obstructive sleep apnea (adult) (pediatric): Secondary | ICD-10-CM

## 2013-08-07 DIAGNOSIS — J690 Pneumonitis due to inhalation of food and vomit: Principal | ICD-10-CM

## 2013-08-07 DIAGNOSIS — R0902 Hypoxemia: Secondary | ICD-10-CM | POA: Diagnosis present

## 2013-08-07 DIAGNOSIS — R0609 Other forms of dyspnea: Secondary | ICD-10-CM

## 2013-08-07 DIAGNOSIS — I509 Heart failure, unspecified: Secondary | ICD-10-CM

## 2013-08-07 DIAGNOSIS — R0989 Other specified symptoms and signs involving the circulatory and respiratory systems: Secondary | ICD-10-CM

## 2013-08-07 DIAGNOSIS — I10 Essential (primary) hypertension: Secondary | ICD-10-CM

## 2013-08-07 LAB — LIPID PANEL
Cholesterol: 138 mg/dL (ref 0–200)
HDL: 48 mg/dL (ref >39)
LDL Cholesterol: 73 mg/dL (ref 0–99)
TRIGLYCERIDES: 84 mg/dL (ref <150)
Total CHOL/HDL Ratio: 2.9 RATIO
VLDL: 17 mg/dL (ref 0–40)

## 2013-08-07 LAB — CBC WITH DIFFERENTIAL/PLATELET
BASOS ABS: 0 10*3/uL (ref 0.0–0.1)
Basophils Relative: 1 % (ref 0–1)
Eosinophils Absolute: 0.1 10*3/uL (ref 0.0–0.7)
Eosinophils Relative: 4 % (ref 0–5)
HCT: 32.6 % — ABNORMAL LOW (ref 39.0–52.0)
Hemoglobin: 11.9 g/dL — ABNORMAL LOW (ref 13.0–17.0)
Lymphocytes Relative: 20 % (ref 12–46)
Lymphs Abs: 0.7 10*3/uL (ref 0.7–4.0)
MCH: 29.7 pg (ref 26.0–34.0)
MCHC: 36.5 g/dL — AB (ref 30.0–36.0)
MCV: 81.3 fL (ref 78.0–100.0)
Monocytes Absolute: 1 10*3/uL (ref 0.1–1.0)
Monocytes Relative: 30 % — ABNORMAL HIGH (ref 3–12)
NEUTROS PCT: 46 % (ref 43–77)
Neutro Abs: 1.5 10*3/uL — ABNORMAL LOW (ref 1.7–7.7)
Platelets: 110 10*3/uL — ABNORMAL LOW (ref 150–400)
RBC: 4.01 MIL/uL — ABNORMAL LOW (ref 4.22–5.81)
RDW: 16.2 % — ABNORMAL HIGH (ref 11.5–15.5)
WBC: 3.4 10*3/uL — ABNORMAL LOW (ref 4.0–10.5)

## 2013-08-07 LAB — COMPREHENSIVE METABOLIC PANEL
ALK PHOS: 42 U/L (ref 39–117)
ALT: 11 U/L (ref 0–53)
AST: 27 U/L (ref 0–37)
Albumin: 3.1 g/dL — ABNORMAL LOW (ref 3.5–5.2)
BUN: 11 mg/dL (ref 6–23)
CALCIUM: 8.5 mg/dL (ref 8.4–10.5)
CO2: 24 meq/L (ref 19–32)
Chloride: 103 mEq/L (ref 96–112)
Creatinine, Ser: 0.78 mg/dL (ref 0.50–1.35)
GFR, EST NON AFRICAN AMERICAN: 86 mL/min — AB (ref >90)
GLUCOSE: 96 mg/dL (ref 70–99)
POTASSIUM: 3.7 meq/L (ref 3.7–5.3)
Sodium: 139 mEq/L (ref 137–147)
Total Bilirubin: 0.5 mg/dL (ref 0.3–1.2)
Total Protein: 6.2 g/dL (ref 6.0–8.3)

## 2013-08-07 LAB — GLUCOSE, CAPILLARY
GLUCOSE-CAPILLARY: 138 mg/dL — AB (ref 70–99)
GLUCOSE-CAPILLARY: 154 mg/dL — AB (ref 70–99)
Glucose-Capillary: 98 mg/dL (ref 70–99)
Glucose-Capillary: 131 mg/dL — ABNORMAL HIGH (ref 70–99)

## 2013-08-07 LAB — HEMOGLOBIN A1C
HEMOGLOBIN A1C: 6.8 % — AB (ref <5.7)
Hgb A1c MFr Bld: 6.7 % — ABNORMAL HIGH (ref <5.7)
Mean Plasma Glucose: 148 mg/dL — ABNORMAL HIGH (ref <117)
Mean Plasma Glucose: 146 mg/dL — ABNORMAL HIGH (ref <117)

## 2013-08-07 LAB — TSH: TSH: 1.655 u[IU]/mL (ref 0.350–4.500)

## 2013-08-07 LAB — INFLUENZA PANEL BY PCR (TYPE A & B)
H1N1 flu by pcr: NOT DETECTED
Influenza A By PCR: NEGATIVE
Influenza B By PCR: NEGATIVE

## 2013-08-07 LAB — TROPONIN I

## 2013-08-07 LAB — PRO B NATRIURETIC PEPTIDE: PRO B NATRI PEPTIDE: 16.9 pg/mL (ref 0–450)

## 2013-08-07 MED ORDER — IPRATROPIUM-ALBUTEROL 0.5-2.5 (3) MG/3ML IN SOLN
3.0000 mL | Freq: Three times a day (TID) | RESPIRATORY_TRACT | Status: DC
  Administered 2013-08-08 – 2013-08-09 (×4): 3 mL via RESPIRATORY_TRACT
  Filled 2013-08-07 (×6): qty 3

## 2013-08-07 MED ORDER — POLYETHYLENE GLYCOL 3350 17 G PO PACK
17.0000 g | PACK | Freq: Two times a day (BID) | ORAL | Status: DC
  Administered 2013-08-07 – 2013-08-08 (×3): 17 g via ORAL
  Filled 2013-08-07 (×6): qty 1

## 2013-08-07 MED ORDER — DEXTROSE 5 % IV SOLN
2.0000 g | Freq: Once | INTRAVENOUS | Status: DC
  Filled 2013-08-07: qty 2

## 2013-08-07 MED ORDER — CARVEDILOL 3.125 MG PO TABS: 3.1250 mg | ORAL_TABLET | Freq: Two times a day (BID) | ORAL | Status: DC

## 2013-08-07 MED ORDER — PROCHLORPERAZINE MALEATE 10 MG PO TABS
10.0000 mg | ORAL_TABLET | Freq: Four times a day (QID) | ORAL | Status: DC | PRN
  Filled 2013-08-07: qty 1

## 2013-08-07 MED ORDER — CARVEDILOL 3.125 MG PO TABS
3.1250 mg | ORAL_TABLET | Freq: Two times a day (BID) | ORAL | Status: DC
  Administered 2013-08-08 – 2013-08-09 (×4): 3.125 mg via ORAL
  Filled 2013-08-07 (×6): qty 1

## 2013-08-07 MED ORDER — ACETAMINOPHEN 325 MG PO TABS: 650.0000 mg | ORAL_TABLET | Freq: Four times a day (QID) | ORAL | Status: DC | PRN

## 2013-08-07 MED ORDER — ENOXAPARIN SODIUM 40 MG/0.4ML ~~LOC~~ SOLN
40.0000 mg | SUBCUTANEOUS | Status: DC
  Administered 2013-08-07: 40 mg via SUBCUTANEOUS
  Filled 2013-08-07 (×2): qty 0.4

## 2013-08-07 MED ORDER — LACTULOSE 10 GM/15ML PO SOLN
10.0000 g | Freq: Two times a day (BID) | ORAL | Status: DC | PRN
  Filled 2013-08-07: qty 15

## 2013-08-07 MED ORDER — FLUTICASONE PROPIONATE 50 MCG/ACT NA SUSP
2.0000 | Freq: Every day | NASAL | Status: DC
  Administered 2013-08-07 – 2013-08-09 (×3): 2 via NASAL
  Filled 2013-08-07: qty 16

## 2013-08-07 MED ORDER — PANTOPRAZOLE SODIUM 40 MG PO TBEC
40.0000 mg | DELAYED_RELEASE_TABLET | Freq: Every day | ORAL | Status: DC
  Administered 2013-08-07 – 2013-08-09 (×3): 40 mg via ORAL
  Filled 2013-08-07 (×3): qty 1

## 2013-08-07 MED ORDER — DEXTROSE 5 % IV SOLN
2.0000 g | Freq: Two times a day (BID) | INTRAVENOUS | Status: DC
  Administered 2013-08-07 – 2013-08-09 (×4): 2 g via INTRAVENOUS
  Filled 2013-08-07 (×5): qty 2

## 2013-08-07 MED ORDER — VANCOMYCIN HCL 10 G IV SOLR
1500.0000 mg | Freq: Once | INTRAVENOUS | Status: AC
  Administered 2013-08-07: 1500 mg via INTRAVENOUS
  Filled 2013-08-07: qty 1500

## 2013-08-07 MED ORDER — ACETAMINOPHEN 650 MG RE SUPP: 650.0000 mg | Freq: Four times a day (QID) | RECTAL | Status: DC | PRN

## 2013-08-07 MED ORDER — ACYCLOVIR 400 MG PO TABS
400.0000 mg | ORAL_TABLET | Freq: Two times a day (BID) | ORAL | Status: DC
  Administered 2013-08-07 – 2013-08-09 (×5): 400 mg via ORAL
  Filled 2013-08-07 (×6): qty 1

## 2013-08-07 MED ORDER — ALBUTEROL SULFATE (2.5 MG/3ML) 0.083% IN NEBU: 2.5000 mg | INHALATION_SOLUTION | RESPIRATORY_TRACT | Status: DC | PRN

## 2013-08-07 MED ORDER — INSULIN ASPART 100 UNIT/ML ~~LOC~~ SOLN
0.0000 [IU] | Freq: Three times a day (TID) | SUBCUTANEOUS | Status: DC
  Administered 2013-08-07 – 2013-08-08 (×3): 1 [IU] via SUBCUTANEOUS
  Filled 2013-08-07: qty 1

## 2013-08-07 MED ORDER — TAMSULOSIN HCL 0.4 MG PO CAPS
0.4000 mg | ORAL_CAPSULE | Freq: Two times a day (BID) | ORAL | Status: DC
  Administered 2013-08-07 – 2013-08-09 (×5): 0.4 mg via ORAL
  Filled 2013-08-07 (×6): qty 1

## 2013-08-07 MED ORDER — ONDANSETRON HCL 4 MG/2ML IJ SOLN: 4.0000 mg | Freq: Four times a day (QID) | INTRAMUSCULAR | Status: DC | PRN

## 2013-08-07 MED ORDER — ASPIRIN EC 325 MG PO TBEC
325.0000 mg | DELAYED_RELEASE_TABLET | Freq: Two times a day (BID) | ORAL | Status: DC
  Administered 2013-08-07 – 2013-08-09 (×5): 325 mg via ORAL
  Filled 2013-08-07 (×6): qty 1

## 2013-08-07 MED ORDER — VANCOMYCIN HCL IN DEXTROSE 1-5 GM/200ML-% IV SOLN
1000.0000 mg | Freq: Two times a day (BID) | INTRAVENOUS | Status: DC
  Administered 2013-08-07 – 2013-08-09 (×4): 1000 mg via INTRAVENOUS
  Filled 2013-08-07 (×5): qty 200

## 2013-08-07 MED ORDER — ALBUTEROL SULFATE (2.5 MG/3ML) 0.083% IN NEBU
2.5000 mg | INHALATION_SOLUTION | RESPIRATORY_TRACT | Status: DC
  Administered 2013-08-07 (×5): 2.5 mg via RESPIRATORY_TRACT
  Filled 2013-08-07 (×4): qty 3

## 2013-08-07 MED ORDER — AMLODIPINE BESYLATE 2.5 MG PO TABS
2.5000 mg | ORAL_TABLET | Freq: Every day | ORAL | Status: DC
Start: 2013-08-07 — End: 2013-08-09
  Administered 2013-08-07 – 2013-08-09 (×3): 2.5 mg via ORAL
  Filled 2013-08-07 (×3): qty 1

## 2013-08-07 MED ORDER — ZOLPIDEM TARTRATE 5 MG PO TABS
5.0000 mg | ORAL_TABLET | Freq: Every evening | ORAL | Status: DC | PRN
  Administered 2013-08-07: 5 mg via ORAL
  Filled 2013-08-07: qty 1

## 2013-08-07 MED ORDER — SODIUM CHLORIDE 0.9 % IJ SOLN: 3.0000 mL | Freq: Two times a day (BID) | INTRAMUSCULAR | Status: DC

## 2013-08-07 MED ORDER — NITROGLYCERIN 0.4 MG SL SUBL: 0.4000 mg | SUBLINGUAL_TABLET | SUBLINGUAL | Status: DC | PRN

## 2013-08-07 MED ORDER — GUAIFENESIN ER 600 MG PO TB12
600.0000 mg | ORAL_TABLET | Freq: Two times a day (BID) | ORAL | Status: DC
  Administered 2013-08-07 – 2013-08-09 (×5): 600 mg via ORAL
  Filled 2013-08-07 (×6): qty 1

## 2013-08-07 MED ORDER — ALBUTEROL SULFATE (2.5 MG/3ML) 0.083% IN NEBU: 2.5000 mg | INHALATION_SOLUTION | Freq: Four times a day (QID) | RESPIRATORY_TRACT | Status: DC | PRN

## 2013-08-07 MED ORDER — ONDANSETRON HCL 4 MG PO TABS: 4.0000 mg | ORAL_TABLET | Freq: Four times a day (QID) | ORAL | Status: DC | PRN

## 2013-08-07 MED ORDER — ATORVASTATIN CALCIUM 20 MG PO TABS
20.0000 mg | ORAL_TABLET | Freq: Every day | ORAL | Status: DC
  Administered 2013-08-07 – 2013-08-08 (×2): 20 mg via ORAL
  Filled 2013-08-07 (×3): qty 1

## 2013-08-07 MED ORDER — POTASSIUM CHLORIDE CRYS ER 20 MEQ PO TBCR
20.0000 meq | EXTENDED_RELEASE_TABLET | Freq: Every day | ORAL | Status: DC
  Administered 2013-08-07 – 2013-08-09 (×3): 20 meq via ORAL
  Filled 2013-08-07 (×3): qty 1

## 2013-08-07 NOTE — ED Notes (Addendum)
While performing flu swab test pt would not let nurse advance swab into nose as far as it should go, pt pushed nurses hand away and pulled out swab.

## 2013-08-07 NOTE — Care Management Note (Addendum)
    Page 1 of 1   08/09/2013     3:23:09 PM   CARE MANAGEMENT NOTE 08/09/2013  Patient:  [name]   Account Number:  [id number]  Date Initiated:  08/07/2013  Documentation initiated by:  Dessa Phi  Subjective/Objective Assessment:   78 Y/O M ADMITTED W/SOB.KT:[id number] MYELOMA.     Action/Plan:   FROM HOME HAS PCP,PHARMACY.   Anticipated DC Date:  08/09/2013   Anticipated DC Plan:  [place]  CM consult      Choice offered to / List presented to:  C-1 Patient   DME arranged  [facility]      DME agency  [place].        Status of service:  Completed, signed off Medicare Important Message given?   (If response is "NO", the following Medicare IM given date fields will be blank) Date Medicare IM given:   Date Additional Medicare IM given:    Discharge Disposition:  HOME/SELF CARE  Per UR Regulation:  Reviewed for med. necessity/level of care/duration of stay  If discussed at [place] of Stay Meetings, dates discussed:    Comments:  08/09/13 [name] Livesey RN,BSN NCM [address] [place] 02.TC [facility] REP [place] DME REP WILL BRING HOME 02 TO RM.NO FURTHER D/C NEEDS OR ORDERS.  08/08/13 [name] Heidelberg RN,BSN NCM [phone] D/C HOME, NO NEEDS OR ORDERS.  08/07/13 [name] Mungin RN,BSN NCM [phone]

## 2013-08-07 NOTE — H&P (Signed)
[facility] History and Physical  Aztlan Coll JOI:[id number] DOB: December 11, 1935 DOA: 08/06/2013  Referring physician: ER physician. PCP: Anthoney Harada, MD  Specialists: Dr. Learta Codding. Oncologist.  Chief Complaint: Shortness of breath.  HPI: [name] is a 78 y.o. male with history of multiple myeloma chemotherapy, OSA, hypertension, hyperlipidemia has been experiencing cough for last few days which at times is productive. Denies any fever chills. Yesterday patient became short of breath and had gone to his PCP and was found that patient wasn't hypoxic and patient was sent to the ER. In the ER patient's d-dimer was found to be elevated and patient had CT angiogram of the chest which was negative for PE but does show features concerning for aspiration pneumonia. Patient at this time is not in acute distress and is on 2 L oxygen. Patient otherwise denies any chest pain palpitations dizziness loss of consciousness nausea vomiting abdominal pain. Patient has been having some pain in the right lower lip on the back side which is being followed by Dr. Orpah Clinton, patient's oncologist.   Review of Systems: As presented in the history of presenting illness, rest negative.  Past Medical History  Diagnosis Date  . GERD (gastroesophageal reflux disease)   . Essential hypertension, benign   . BPH (benign prostatic hyperplasia)   . Lumbar herniated disc     L4-5  . Hyperlipidemia   . Sleep apnea   . Multiple myeloma 02/19/2008  . Arthritis   . Diverticulosis   . Peptic ulcer disease   . Tubular adenoma 02/16/2008    Dr. Silvano Rusk   Past Surgical History  Procedure Laterality Date  . Neck surgery    . Lumbar laminectomy/decompression microdiscectomy  11/01/2011    Procedure: LUMBAR LAMINECTOMY/DECOMPRESSION MICRODISCECTOMY 2 LEVELS;  Surgeon: Charlie Pitter, MD;  Location: [facility];  Service: Neurosurgery;  Laterality: Right;  Lumbar Laminectomy/Microdiscectomy Decompression Lumbar  Three-Four, Lumbar Four-Five   . Colonoscopy    . Hemorrhoid banding  2014   Social History:  reports that he quit smoking about 23 years ago. His smoking use included Cigarettes. He has a 40 pack-year smoking history. He has never used smokeless tobacco. He reports that he does not drink alcohol or use illicit drugs. Where does patient live home. Can patient participate in ADLs? Yes.  Allergies  Allergen Reactions  . Penicillins Hives and Rash    Family History:  Family History  Problem Relation Age of Onset  . Adopted: Yes      Prior to Admission medications   Medication Sig Start Date End Date Taking? Authorizing Provider  acyclovir (ZOVIRAX) 400 MG tablet Take 400 mg by mouth 2 (two) times daily.   Yes Historical Provider, MD  amLODipine (NORVASC) 2.5 MG tablet Take 1 tablet (2.5 mg total) by mouth daily. 05/11/13  Yes Vernie Shanks, MD  aspirin 325 MG EC tablet Take 1 tablet (325 mg total) by mouth 2 (two) times daily. Do not take again until March 20, 2013 03/06/13  Yes Gatha Mayer, MD  atorvastatin (LIPITOR) 20 MG tablet Take 20 mg by mouth daily.   Yes Historical Provider, MD  cyclophosphamide (CYTOXAN) 50 MG tablet Take 600 mg by mouth. Every Thursday as directed.  Give on an empty stomach 1 hour before or 2 hours after meals.   Yes Historical Provider, MD  dexamethasone (DECADRON) 4 MG tablet Take 20 mg by mouth. Take 5 tablets on Thursdays when you are not getting chemotherapy.   Yes Historical Provider, MD  fluticasone (FLONASE) 50 MCG/ACT nasal spray Place 2 sprays into both nostrils daily. 06/11/13  Yes Vernie Shanks, MD  lactulose (CHRONULAC) 10 GM/15ML solution Take 10 g by mouth 2 (two) times daily as needed. 07/31/13  Yes Owens Shark, NP  NITROSTAT 0.4 MG SL tablet Place 0.4 mg under the tongue as needed for chest pain.  12/07/12  Yes Historical Provider, MD  omeprazole (PRILOSEC) 20 MG capsule Take 20 mg by mouth daily.   Yes Historical Provider, MD   polyethylene glycol (MIRALAX / GLYCOLAX) packet Take 17 g by mouth 2 (two) times daily.    Yes Historical Provider, MD  potassium chloride SA (KLOR-CON M20) 20 MEQ tablet Take 1 tablet (20 mEq total) by mouth daily. 06/26/13  Yes Vernie Shanks, MD  PRESCRIPTION MEDICATION Inject as directed every 7 (seven) days. Velcade $RemoveBefor'3mg'qimSXpTKkpxq$  injection q7d on Thursdays, skips every 4th week.   Yes Historical Provider, MD  prochlorperazine (COMPAZINE) 10 MG tablet Take 10 mg by mouth every 6 (six) hours as needed (for nausea).   Yes Historical Provider, MD  tamsulosin (FLOMAX) 0.4 MG CAPS capsule Take 0.4 mg by mouth 2 (two) times daily. With a  Meal.   Yes Historical Provider, MD  zolpidem (AMBIEN) 5 MG tablet Take 5 mg by mouth at bedtime as needed for sleep.  04/05/13  Yes Historical Provider, MD    Physical Exam: Filed Vitals:   08/07/13 0139 08/07/13 0215 08/07/13 0222 08/07/13 0320  BP:      Pulse: 85 85  81  Temp:      TempSrc:      Resp: 21     Height:   6' (1.829 m)   Weight:   115.667 kg (255 lb)   SpO2: 96% 93%  93%     General:  Well-developed and nourished.  Eyes: Anicteric no pallor.  ENT: No discharge from the ears eyes nose mouth.  Neck: No mass felt.  Cardiovascular: S1-S2 heard.  Respiratory: No rhonchi or crepitations.  Abdomen: Soft nontender bowel sounds present.  Skin: No rash.  Musculoskeletal: No edema.  Psychiatric: Appears normal.  Neurologic: Alert awake oriented to time place and person. Moves all extremities.  Labs on Admission:  Basic Metabolic Panel:  Recent Labs Lab 08/06/13 1408  NA 139  K 4.0  CL 102  CO2 24  GLUCOSE 100*  BUN 12  CREATININE 0.99  CALCIUM 8.7   Liver Function Tests:  Recent Labs Lab 08/06/13 1910  AST 33  ALT 14  ALKPHOS 46  BILITOT 0.4  PROT 6.5  ALBUMIN 3.5    Recent Labs Lab 08/06/13 1852  LIPASE 42   No results found for this basename: AMMONIA,  in the last 168 hours CBC:  Recent Labs Lab  08/06/13 1408  WBC 3.6*  NEUTROABS 2.1  HGB 13.0  HCT 37.0*  MCV 82.4  PLT 112*   Cardiac Enzymes:  Recent Labs Lab 08/06/13 1852  TROPONINI <0.30    BNP (last 3 results) No results found for this basename: PROBNP,  in the last 8760 hours CBG: No results found for this basename: GLUCAP,  in the last 168 hours  Radiological Exams on Admission: Dg Chest 2 View  08/06/2013   CLINICAL DATA:  Cough, shortness of breath and dizziness.  EXAM: CHEST - 2 VIEW  COMPARISON:  DG RIBS UNILATERAL W/CHEST*R* dated 07/31/2013; DG CHEST 2V dated 04/09/2013  FINDINGS: Lungs show stable chronic disease. There is no evidence of pulmonary edema, consolidation,  pneumothorax, nodule or pleural fluid. The heart size is normal. The bony thorax shows mild degenerative disease of the thoracic spine.  IMPRESSION: Stable chronic lung disease.   Electronically Signed   By: Aletta Edouard M.D.   On: 08/06/2013 19:26   Ct Angio Chest Pe W/cm &/or Wo Cm  08/07/2013   CLINICAL DATA:  Multiple myeloma, drop in oxygen saturation. Epigastric pain.  EXAM: CT ANGIOGRAPHY CHEST WITH CONTRAST  TECHNIQUE: Multidetector CT imaging of the chest was performed using the standard protocol during bolus administration of intravenous contrast. Multiplanar CT image reconstructions including MIPs were obtained to evaluate the vascular anatomy.  CONTRAST:  1 OMNIPAQUE IOHEXOL 300 MG/ML SOLN, 170mL OMNIPAQUE IOHEXOL 350 MG/ML SOLN  COMPARISON:  08/06/2013 chest radiograph and abdominal pelvic CT  FINDINGS: The pulmonary arterial branch vessels are patent.  Normal caliber aorta with mild scattered atherosclerotic disease.  Normal heart size.  Coronary artery calcifications.  Trace pericardial fluid.  No pleural effusion.  Mucus/debris within the right main bronchus and bilateral lower and middle lobe bronchi. Bilateral lower lobe peribronchial thickening. Mild right lower lobe opacity. Mild peripheral reticular opacities. Calcified granuloma  left upper and lower lobe.  Upper abdominal images show a 4 cm left hepatic lobe lesion with a peripheral nodular hypervascular enhancement. There may be a smaller lesion within the right hepatic lobe on image 65 series 4.  Multilevel degenerative change. Partially imaged cervical fusion hardware. No acute osseous finding.  Review of the MIP images confirms the above findings.  IMPRESSION: No pulmonary embolism.  Lower lobe peribronchial thickening and debris within right greater than left lower lobe bronchi. May reflect bronchitis or aspiration pneumonitis.  4 cm left hepatic lobe lesion is favored to reflect a hemangioma. There may be a smaller right hepatic lobe hemangioma versus blush of variant perfusion.   Electronically Signed   By: Carlos Levering M.D.   On: 08/07/2013 00:50   Ct Abdomen Pelvis W Contrast  08/06/2013   CLINICAL DATA:  Left-sided abdominal pain and fever. History of multiple myeloma.  EXAM: CT ABDOMEN AND PELVIS WITH CONTRAST  TECHNIQUE: Multidetector CT imaging of the abdomen and pelvis was performed using the standard protocol following bolus administration of intravenous contrast.  CONTRAST:  171mL OMNIPAQUE IOHEXOL 300 MG/ML  SOLN  COMPARISON:  DG ABD ACUTE W/CHEST dated 02/10/2013; CT ABD/PELV WO CM dated 10/09/2011; US RENAL dated 03/06/2010; MR L SPINE WO/W CM dated 03/23/2012  FINDINGS: Stable hemangioma of the superior left lobe of the liver measures approximately 3.4 cm in diameter. The gallbladder, spleen, pancreas and adrenal glands are within normal limits. Hyperdense lesion of the posterior right kidney shows stable size since prior CT, measuring approximately 2.4 cm in greatest diameter. This is likely a complex/ hemorrhagic cyst. No calculi or renal obstruction identified.  Bowel loops are unremarkable in show no evidence of obstruction or inflammation. Diverticulosis of the colon identified without evidence of diverticulitis. The bladder is unremarkable. No enlarged lymph  nodes are seen. The abdominal aorta is of normal caliber. No hernias are identified. The bony thorax shows advanced degenerative disc disease of the lumbar spine without focal bony lesions, fractures or subluxations.  IMPRESSION: No acute findings. Stable hemangioma of the left lobe the liver and complex/hemorrhagic cystic lesion of the right kidney.   Electronically Signed   By: Aletta Edouard M.D.   On: 08/06/2013 20:53    EKG: Independently reviewed. Normal sinus rhythm.  Assessment/Plan Principal Problem:   SOB (shortness of breath) Active  Problems:   Obstructive sleep apnea   Multiple myeloma   Essential hypertension, benign   Hypoxia   Pneumonia   1. Shortness of breath most likely secondary to aspiration pneumonia - since patient has been having cough for last few days I have ordered influenza PCR. For now given patient's history of chemotherapy I have placed patient on vancomycin and cefepime. Patient allergic to penicillin. I have requested swallow evaluation as there is concern for aspiration. Check BNP. 2. Leukopenia and thrombocytopenia - probably from chemotherapy. Follow CBC. 3. OSA - CPAP per respiratory. 4. Hypertension - continue home medications. 5. Hyperlipidemia - continue home medications. 6. Multiple myeloma - per oncologist. 7. History of diabetes mellitus per the charts - patient is not on any medications. I have ordered sliding scale coverage. Check hemoglobin A1c.  I have reviewed patient's old charts and labs.  Code Status: Full code.  Family Communication: Patient's wife at the bedside.  Disposition Plan: Admit to inpatient.    Lucille Crichlow N. [facility] Pager [phone].  If 7PM-7AM, please contact night-coverage [email or web address] Password [email or web address] 08/07/2013, 5:22 AM

## 2013-08-07 NOTE — ED Notes (Signed)
Hold vancomycin until seen by admitting MD.

## 2013-08-07 NOTE — Progress Notes (Addendum)
ANTIBIOTIC CONSULT NOTE - INITIAL  Pharmacy Consult for Cefepime/vancomycin Indication: PNA  Allergies  Allergen Reactions  . Penicillins Hives and Rash    Patient Measurements: Height: 6' (182.9 cm) Weight: 255 lb (115.667 kg) IBW/kg (Calculated) : 77.6 Adjusted Body Weight:   Vital Signs: Temp: 98 F (36.7 C) (01/26 1832) Temp src: Oral (01/26 1832) BP: 140/78 mmHg (01/27 0100) Pulse Rate: 81 (01/27 0320) Intake/Output from previous day:   Intake/Output from this shift:    Labs:  Recent Labs  08/06/13 1408  WBC 3.6*  HGB 13.0  PLT 112*  CREATININE 0.99   Estimated Creatinine Clearance: 84.6 ml/min (by C-G formula based on Cr of 0.99). No results found for this basename: VANCOTROUGH, VANCOPEAK, VANCORANDOM, GENTTROUGH, GENTPEAK, GENTRANDOM, TOBRATROUGH, TOBRAPEAK, TOBRARND, AMIKACINPEAK, AMIKACINTROU, AMIKACIN,  in the last 72 hours   Microbiology: No results found for this or any previous visit (from the past 720 hour(s)).  Medical History: Past Medical History  Diagnosis Date  . GERD (gastroesophageal reflux disease)   . Essential hypertension, benign   . BPH (benign prostatic hyperplasia)   . Lumbar herniated disc     L4-5  . Hyperlipidemia   . Sleep apnea   . Multiple myeloma 02/19/2008  . Arthritis   . Diverticulosis   . Peptic ulcer disease   . Tubular adenoma 02/16/2008    Dr. Silvano Rusk    Medications:  Anti-infectives   Start     Dose/Rate Route Frequency Ordered Stop   08/07/13 1800  ceFEPIme (MAXIPIME) 2 g in dextrose 5 % 50 mL IVPB     2 g 100 mL/hr over 30 Minutes Intravenous Every 12 hours 08/07/13 0523     08/07/13 0430  ceFEPIme (MAXIPIME) 2 g in dextrose 5 % 50 mL IVPB     2 g 100 mL/hr over 30 Minutes Intravenous  Once 08/07/13 0415 08/07/13 0454   08/07/13 0230  vancomycin (VANCOCIN) 1,500 mg in sodium chloride 0.9 % 500 mL IVPB     1,500 mg 250 mL/hr over 120 Minutes Intravenous  Once 08/07/13 0217        Assessment: Patient with PNA.  First dose of antibiotics already given in ED.    Goal of Therapy:  Cefepime  dosed based on patient weight and renal function Vancomycin 15-20   Plan:  Cefepime 2gm iv q12hr Vancomycin 1521m iv x1, then 1gm iv q12hr  GNani SkillernCrowford 08/07/2013,5:23 AM

## 2013-08-07 NOTE — Progress Notes (Signed)
[facility] PROGRESS NOTE  [name] WUJ:[id number] DOB: 1936-02-05 DOA: 08/06/2013 PCP: Anthoney Harada, MD  Assessment/Plan:  SOB;  -most likely secondary to aspiration pneumonia  - since patient has been having cough for last few days I have ordered influenza PCR. For now given -continue  vancomycin and cefepime, would not start Tamiflu secondary to patient's symptoms starting 2 weeks ago -. Patient allergic to penicillin. I have requested swallow evaluation as there is concern for aspiration. -BNP; within normal limit,.  Aspiration pneumonia -See SOB  Leukopenia and thrombocytopenia  - probably from chemotherapy. Follow CBC. -Oncology consulted  OSA  - CPAP per respiratory.  Hypertension  - continue home medications.  Hyperlipidemia  - continue home medications.  Multiple myeloma  - Consulted oncology to inform them patient is admitted   History of diabetes mellitus per the charts - patient is not on any medications. I have ordered sliding scale coverage.  -Check hemoglobin A1c.  Diastolic CHF -Start on Coreg, low-dose    Code Status: Full  Family Communication: None present Disposition Plan:    Consultants: Oncology    Procedures: CT angiogram chest 08/07/2013 No pulmonary embolism.  Lower lobe peribronchial thickening and debris within right greater  than left lower lobe bronchi. May reflect bronchitis or aspiration  pneumonitis.  4 cm left hepatic lobe lesion is favored to reflect a hemangioma.  There may be a smaller right hepatic lobe hemangioma versus blush of  variant perfusion.  CT abdomen pelvis with contrast 08/06/2013 No acute findings. Stable hemangioma of the left lobe the liver and  complex/hemorrhagic cystic lesion of the right kidney.  CXR 08/06/2013 Stable chronic lung disease.  Echocardiogram 05/30/2013 - Left ventricle: mild LVH. Systolic function was normal.  -LVEF;  60% to 65%.  -(grade 1 diastolic dysfunction). -  Aortic valve: Valve area: 2.64cm^2(VTI). Valve area: 2.54cm^2 (Vmax). - Mitral valve: Calcified annulus. Mildly thickened leaflets .- Pericardium, extracardiac: There is an incidental finding of a 3 x 3 cm liver cyst, consider dedicated imaging for further evalution.     Antibiotics: Acyclovir 1/27>> Cefepime 1/27>> Vancomycin 1/27>>    HPIHistory/Subjective: [name] is a 78 y.o. BM PMHx HTN, HLD, OSA, chronic DOE, renal insufficiency, Hx Multiple Myeloma chemotherapy. Presented to ED for cough for last few days which at times is productive. Denies any fever chills. Yesterday patient became short of breath and had gone to his PCP and was found that patient wasn't hypoxic and patient was sent to the ER. In the ER patient's d-dimer was found to be elevated and patient had CT angiogram of the chest which was negative for PE but does show features concerning for aspiration pneumonia. Patient at this time is not in acute distress and is on 2 L oxygen. Patient otherwise denies any chest pain palpitations dizziness loss of consciousness nausea vomiting abdominal pain. Patient has been having some pain in the right lower lip on the back side which is being followed by Dr. Orpah Clinton, patient's oncologist. States reason he presented to ED with secondary to increasing cough x2 weeks. States wife has a cold. Last chemotherapy was 2 weeks ago.   Objective: Filed Vitals:   08/07/13 0500 08/07/13 0530 08/07/13 0600 08/07/13 0630  BP: 133/75 119/69 126/77 143/75  Pulse: 89 69 74 63  Temp:      TempSrc:      Resp:      Height:      Weight:      SpO2: 92% 94% 95% 100%  No intake or output data in the 24 hours ending 08/07/13 0855 Filed Weights   08/07/13 0222  Weight: 115.667 kg (255 lb)    Exam:   General: A./O., NAD   Cardiovascular: Regular rhythm and rate, negative murmurs rubs or gallops, DP/PT 1+ bilateral   Respiratory: Clear to auscultation bilateral  Abdomen: Soft, nontender,  nondistended, plus bowel  Musculoskeletal: Negative pedal edema   Data Reviewed: Basic Metabolic Panel:  Recent Labs Lab 08/06/13 1408 08/07/13 0625  NA 139 139  K 4.0 3.7  CL 102 103  CO2 24 24  GLUCOSE 100* 96  BUN 12 11  CREATININE 0.99 0.78  CALCIUM 8.7 8.5   Liver Function Tests:  Recent Labs Lab 08/06/13 1910 08/07/13 0625  AST 33 27  ALT 14 11  ALKPHOS 46 42  BILITOT 0.4 0.5  PROT 6.5 6.2  ALBUMIN 3.5 3.1*    Recent Labs Lab 08/06/13 1852  LIPASE 42   No results found for this basename: AMMONIA,  in the last 168 hours CBC:  Recent Labs Lab 08/06/13 1408 08/07/13 0625  WBC 3.6* 3.4*  NEUTROABS 2.1 1.5*  HGB 13.0 11.9*  HCT 37.0* 32.6*  MCV 82.4 81.3  PLT 112* 110*   Cardiac Enzymes:  Recent Labs Lab 08/06/13 1852 08/07/13 0625  TROPONINI <0.30 <0.30   BNP (last 3 results)  Recent Labs  08/07/13 0625  PROBNP 16.9   CBG:  Recent Labs Lab 08/07/13 0833  GLUCAP 98    No results found for this or any previous visit (from the past 240 hour(s)).   Studies: Dg Chest 2 View  08/06/2013   CLINICAL DATA:  Cough, shortness of breath and dizziness.  EXAM: CHEST - 2 VIEW  COMPARISON:  DG RIBS UNILATERAL W/CHEST*R* dated 07/31/2013; DG CHEST 2V dated 04/09/2013  FINDINGS: Lungs show stable chronic disease. There is no evidence of pulmonary edema, consolidation, pneumothorax, nodule or pleural fluid. The heart size is normal. The bony thorax shows mild degenerative disease of the thoracic spine.  IMPRESSION: Stable chronic lung disease.   Electronically Signed   By: Aletta Edouard M.D.   On: 08/06/2013 19:26   Ct Angio Chest Pe W/cm &/or Wo Cm  08/07/2013   CLINICAL DATA:  Multiple myeloma, drop in oxygen saturation. Epigastric pain.  EXAM: CT ANGIOGRAPHY CHEST WITH CONTRAST  TECHNIQUE: Multidetector CT imaging of the chest was performed using the standard protocol during bolus administration of intravenous contrast. Multiplanar CT image  reconstructions including MIPs were obtained to evaluate the vascular anatomy.  CONTRAST:  1 OMNIPAQUE IOHEXOL 300 MG/ML SOLN, 128mL OMNIPAQUE IOHEXOL 350 MG/ML SOLN  COMPARISON:  08/06/2013 chest radiograph and abdominal pelvic CT  FINDINGS: The pulmonary arterial branch vessels are patent.  Normal caliber aorta with mild scattered atherosclerotic disease.  Normal heart size.  Coronary artery calcifications.  Trace pericardial fluid.  No pleural effusion.  Mucus/debris within the right main bronchus and bilateral lower and middle lobe bronchi. Bilateral lower lobe peribronchial thickening. Mild right lower lobe opacity. Mild peripheral reticular opacities. Calcified granuloma left upper and lower lobe.  Upper abdominal images show a 4 cm left hepatic lobe lesion with a peripheral nodular hypervascular enhancement. There may be a smaller lesion within the right hepatic lobe on image 65 series 4.  Multilevel degenerative change. Partially imaged cervical fusion hardware. No acute osseous finding.  Review of the MIP images confirms the above findings.  IMPRESSION: No pulmonary embolism.  Lower lobe peribronchial thickening and debris within right  greater than left lower lobe bronchi. May reflect bronchitis or aspiration pneumonitis.  4 cm left hepatic lobe lesion is favored to reflect a hemangioma. There may be a smaller right hepatic lobe hemangioma versus blush of variant perfusion.   Electronically Signed   By: Carlos Levering M.D.   On: 08/07/2013 00:50   Ct Abdomen Pelvis W Contrast  08/06/2013   CLINICAL DATA:  Left-sided abdominal pain and fever. History of multiple myeloma.  EXAM: CT ABDOMEN AND PELVIS WITH CONTRAST  TECHNIQUE: Multidetector CT imaging of the abdomen and pelvis was performed using the standard protocol following bolus administration of intravenous contrast.  CONTRAST:  138mL OMNIPAQUE IOHEXOL 300 MG/ML  SOLN  COMPARISON:  DG ABD ACUTE W/CHEST dated 02/10/2013; CT ABD/PELV WO CM dated  10/09/2011; US RENAL dated 03/06/2010; MR L SPINE WO/W CM dated 03/23/2012  FINDINGS: Stable hemangioma of the superior left lobe of the liver measures approximately 3.4 cm in diameter. The gallbladder, spleen, pancreas and adrenal glands are within normal limits. Hyperdense lesion of the posterior right kidney shows stable size since prior CT, measuring approximately 2.4 cm in greatest diameter. This is likely a complex/ hemorrhagic cyst. No calculi or renal obstruction identified.  Bowel loops are unremarkable in show no evidence of obstruction or inflammation. Diverticulosis of the colon identified without evidence of diverticulitis. The bladder is unremarkable. No enlarged lymph nodes are seen. The abdominal aorta is of normal caliber. No hernias are identified. The bony thorax shows advanced degenerative disc disease of the lumbar spine without focal bony lesions, fractures or subluxations.  IMPRESSION: No acute findings. Stable hemangioma of the left lobe the liver and complex/hemorrhagic cystic lesion of the right kidney.   Electronically Signed   By: Aletta Edouard M.D.   On: 08/06/2013 20:53    Scheduled Meds: . acyclovir  400 mg Oral BID  . albuterol  2.5 mg Nebulization Q4H  . amLODipine  2.5 mg Oral Daily  . aspirin  325 mg Oral BID  . atorvastatin  20 mg Oral q1800  . enoxaparin (LOVENOX) injection  40 mg Subcutaneous Q24H  . fluticasone  2 spray Each Nare Daily  . guaiFENesin  600 mg Oral BID  . insulin aspart  0-9 Units Subcutaneous TID WC  . pantoprazole  40 mg Oral Daily  . polyethylene glycol  17 g Oral BID  . potassium chloride SA  20 mEq Oral Daily  . tamsulosin  0.4 mg Oral BID   Continuous Infusions: . ceFEPime (MAXIPIME) IV Stopped (08/07/13 0424)  . ceFEPime (MAXIPIME) IV    . vancomycin      Principal Problem:   SOB (shortness of breath) Active Problems:   Obstructive sleep apnea   Multiple myeloma   Essential hypertension, benign   Hypoxia    Pneumonia    Time spent: 40 min    WOODS, CURTIS, J  [facility] Pager [phone]. If 7PM-7AM, please contact night-coverage at [email or web address], password [facility] 08/07/2013, 8:55 AM  LOS: 1 day

## 2013-08-07 NOTE — ED Notes (Signed)
Sandwich and sprite provided to the patient per request.

## 2013-08-07 NOTE — Progress Notes (Signed)
UR completed 

## 2013-08-07 NOTE — Progress Notes (Signed)
Patient admitted from Emergency room to 1409, alert and oriented, denies any pain/distress. Orientated patient to room/unit and reviewed plan of care with patient. Confirmed with Dr. Clydene Laming about patient order droplet precaution for  and he stated the patient does not need to be on precaution anymore, also notified infection prevention and she stated patient does not need to be on droplet precaution. Reported off to night shift nurse.

## 2013-08-07 NOTE — ED Notes (Signed)
PA at bedside.

## 2013-08-08 DIAGNOSIS — R062 Wheezing: Secondary | ICD-10-CM

## 2013-08-08 LAB — COMPREHENSIVE METABOLIC PANEL
ALBUMIN: 3.2 g/dL — AB (ref 3.5–5.2)
ALT: 11 U/L (ref 0–53)
AST: 28 U/L (ref 0–37)
Alkaline Phosphatase: 45 U/L (ref 39–117)
BUN: 8 mg/dL (ref 6–23)
CO2: 22 mEq/L (ref 19–32)
CREATININE: 0.8 mg/dL (ref 0.50–1.35)
Calcium: 8.8 mg/dL (ref 8.4–10.5)
Chloride: 101 mEq/L (ref 96–112)
GFR calc Af Amer: >90 mL/min (ref >90)
GFR calc non Af Amer: 85 mL/min — ABNORMAL LOW (ref >90)
Glucose, Bld: 177 mg/dL — ABNORMAL HIGH (ref 70–99)
POTASSIUM: 3.8 meq/L (ref 3.7–5.3)
Sodium: 138 mEq/L (ref 137–147)
Total Bilirubin: 0.5 mg/dL (ref 0.3–1.2)
Total Protein: 6.2 g/dL (ref 6.0–8.3)

## 2013-08-08 LAB — GLUCOSE, CAPILLARY
GLUCOSE-CAPILLARY: 113 mg/dL — AB (ref 70–99)
Glucose-Capillary: 120 mg/dL — ABNORMAL HIGH (ref 70–99)
Glucose-Capillary: 137 mg/dL — ABNORMAL HIGH (ref 70–99)

## 2013-08-08 LAB — CBC WITH DIFFERENTIAL/PLATELET
BASOS PCT: 1 % (ref 0–1)
Basophils Absolute: 0 10*3/uL (ref 0.0–0.1)
Eosinophils Absolute: 0.1 10*3/uL (ref 0.0–0.7)
Eosinophils Relative: 3 % (ref 0–5)
HCT: 32.8 % — ABNORMAL LOW (ref 39.0–52.0)
HEMOGLOBIN: 11.1 g/dL — AB (ref 13.0–17.0)
Lymphocytes Relative: 19 % (ref 12–46)
Lymphs Abs: 0.7 10*3/uL (ref 0.7–4.0)
MCH: 27.6 pg (ref 26.0–34.0)
MCHC: 33.8 g/dL (ref 30.0–36.0)
MCV: 81.6 fL (ref 78.0–100.0)
MONO ABS: 0.6 10*3/uL (ref 0.1–1.0)
MONOS PCT: 16 % — AB (ref 3–12)
Neutro Abs: 2.3 10*3/uL (ref 1.7–7.7)
Neutrophils Relative %: 62 % (ref 43–77)
Platelets: 112 10*3/uL — ABNORMAL LOW (ref 150–400)
RBC: 4.02 MIL/uL — ABNORMAL LOW (ref 4.22–5.81)
RDW: 16.2 % — ABNORMAL HIGH (ref 11.5–15.5)
WBC: 3.7 10*3/uL — ABNORMAL LOW (ref 4.0–10.5)

## 2013-08-08 LAB — MAGNESIUM: MAGNESIUM: 1.6 mg/dL (ref 1.5–2.5)

## 2013-08-08 MED ORDER — ENOXAPARIN SODIUM 60 MG/0.6ML ~~LOC~~ SOLN
60.0000 mg | SUBCUTANEOUS | Status: DC
  Administered 2013-08-08 – 2013-08-09 (×2): 60 mg via SUBCUTANEOUS
  Filled 2013-08-08 (×2): qty 0.6

## 2013-08-08 NOTE — Progress Notes (Signed)
Spoke with patient in regards to readiness for nighttime CPAP, patient states doesn't want to wear tonight--verbalizes understanding to call if this decision changes at anytime. RT will assist as needed.

## 2013-08-08 NOTE — ED Provider Notes (Signed)
Medical screening examination/treatment/procedure(s) were conducted as a shared visit with non-physician practitioner(s) and myself.  I personally evaluated the patient during the encounter.  EKG Interpretation    Date/Time:  Monday August 06 2013 18:37:51 EST Ventricular Rate:  82 PR Interval:  107 QRS Duration: 86 QT Interval:  368 QTC Calculation: 430 R Axis:   5 Text Interpretation:  ED PHYSICIAN INTERPRETATION AVAILABLE IN [facility] Confirmed by TEST, RECORD ([id number]) on 08/08/2013 7:05:38 AM            Patient with respiratory and abdominal symptoms. No PE or intra-abd cause of his symptoms. Feel abd is related to coughing. New hypoxia is unexplained, and he will need further respiratory care as inpatient.   Ephraim Hamburger, MD 08/08/13 1025

## 2013-08-08 NOTE — Progress Notes (Signed)
Patient up ambulating in the hall, obtain O2 sat before ambulating--RA 97%  Pulse  89, resp 20 during walking-- O2 sat 87-88 % pt asked to rest a minute--SOB noted, heartrate 120-122, resp 22-24, upon return sitting on side of bed O2 sat 92 % heart rate decreasing to base line and resp 20-22 some SOB noted, pt some what upset stating he is OK. Pt obviously SOB during ambulation pt walked about 100 feet+. Pt currently at base line RA 97 % heartrate 89-90. SRP RN, BSN

## 2013-08-08 NOTE — Progress Notes (Addendum)
[facility] PROGRESS NOTE  [name] NOP:[id number] DOB: [birth year] DOA: 08/06/2013 PCP: Redmond Baseman, MD  Assessment/Plan:  SOB;  -most likely secondary to aspiration pneumonia  - since patient has been having cough for last few days I have ordered influenza PCR. For now given -continue  vancomycin and cefepime, would not start Tamiflu secondary to patient's symptoms starting 2 weeks ago -. Patient allergic to penicillin. I have requested swallow evaluation as there is concern for aspiration. -BNP; within normal limit,. -Ambulatory SpO2;  O2 sat before ambulating--RA 97% Pulse 89, resp 20   during ambulation SPO2 87-88 % pt asked to rest a minute--SOB noted, heartrate 120-122, resp              22-24,   upon return sitting on side of bed O2 sat return to 92 % heart rate decreasing to base line and resp               20-22 some SOB noted,   Aspiration pneumonia -See SOB  Leukopenia and thrombocytopenia  - probably from chemotherapy. Follow CBC. -Oncology consulted  OSA  - CPAP per respiratory.  Hypertension  - continue home medications.  Hyperlipidemia  - continue home medications.  Multiple myeloma  - Oncology has not left a note, will reconsult oncology in a.m.   -  History of diabetes mellitus per the charts - patient is not on any medications. I have ordered sliding scale coverage.  -Check hemoglobin A1c.  Diastolic CHF -Start on Coreg, low-dose    Code Status: Full  Family Communication: None present Disposition Plan:    Consultants: Oncology    Procedures: CT angiogram chest 08/07/2013 No pulmonary embolism.  Lower lobe peribronchial thickening and debris within right greater  than left lower lobe bronchi. May reflect bronchitis or aspiration  pneumonitis.  4 cm left hepatic lobe lesion is favored to reflect a hemangioma.  There may be a smaller right hepatic lobe hemangioma versus blush of  variant perfusion.  CT abdomen pelvis  with contrast 08/06/2013 No acute findings. Stable hemangioma of the left lobe the liver and  complex/hemorrhagic cystic lesion of the right kidney.  CXR 08/06/2013 Stable chronic lung disease.  Echocardiogram 05/30/2013 - Left ventricle: mild LVH. Systolic function was normal.  -LVEF;  60% to 65%.  -(grade 1 diastolic dysfunction). - Aortic valve: Valve area: 2.64cm^2(VTI). Valve area: 2.54cm^2 (Vmax). - Mitral valve: Calcified annulus. Mildly thickened leaflets .- Pericardium, extracardiac: There is an incidental finding of a 3 x 3 cm liver cyst, consider dedicated imaging for further evalution.     Antibiotics: Acyclovir 1/27>> Cefepime 1/27>> Vancomycin 1/27>>    HPIHistory/Subjective: [name] is a 78 y.o. BM PMHx HTN, HLD, OSA, chronic DOE, renal insufficiency, Hx Multiple Myeloma chemotherapy. Presented to ED for cough for last few days which at times is productive. Denies any fever chills. Yesterday patient became short of breath and had gone to his PCP and was found that patient wasn't hypoxic and patient was sent to the ER. In the ER patient's d-dimer was found to be elevated and patient had CT angiogram of the chest which was negative for PE but does show features concerning for aspiration pneumonia. Patient at this time is not in acute distress and is on 2 L oxygen. Patient otherwise denies any chest pain palpitations dizziness loss of consciousness nausea vomiting abdominal pain. Patient has been having some pain in the right lower lip on the back side which is being followed by Dr. Mart Piggs, patient's  oncologist. States reason he presented to ED with secondary to increasing cough x2 weeks. States wife has a cold. Last chemotherapy was 2 weeks ago. 1/28 patient has not been seen by Dr. Orpah Clinton (oncologist), and per wife when she spoke with Dr. Orpah Clinton was told that he was never notified that patient was hospitalized. However I informed her I spoken with RN Debbie on 1/27 to  the office know that patient was hospitalized.    Objective: Filed Vitals:   08/07/13 2046 08/07/13 2145 08/08/13 0513 08/08/13 0805  BP:  132/60 120/75   Pulse: 91 94 77   Temp:  98.8 F (37.1 C) 98.6 F (37 C)   TempSrc:  Oral Oral   Resp: $Remo'18 20 20   'jfpCI$ Height:      Weight:      SpO2: 94% 97% 98% 96%    Intake/Output Summary (Last 24 hours) at 08/08/13 0901 Last data filed at 08/08/13 0745  Gross per 24 hour  Intake    740 ml  Output   2351 ml  Net  -1611 ml   Filed Weights   08/07/13 0222 08/07/13 1614  Weight: 115.667 kg (255 lb) 116.166 kg (256 lb 1.6 oz)    Exam:   General: A./O., NAD   Cardiovascular: Regular rhythm and rate, negative murmurs rubs or gallops, DP/PT 1+ bilateral   Respiratory: Clear to auscultation bilateral, decreased air movement all lung lobes/tightness  Abdomen: Soft, nontender, nondistended, plus bowel  Musculoskeletal: Negative pedal edema   Data Reviewed: Basic Metabolic Panel:  Recent Labs Lab 08/06/13 1408 08/07/13 0625  NA 139 139  K 4.0 3.7  CL 102 103  CO2 24 24  GLUCOSE 100* 96  BUN 12 11  CREATININE 0.99 0.78  CALCIUM 8.7 8.5   Liver Function Tests:  Recent Labs Lab 08/06/13 1910 08/07/13 0625  AST 33 27  ALT 14 11  ALKPHOS 46 42  BILITOT 0.4 0.5  PROT 6.5 6.2  ALBUMIN 3.5 3.1*    Recent Labs Lab 08/06/13 1852  LIPASE 42   No results found for this basename: AMMONIA,  in the last 168 hours CBC:  Recent Labs Lab 08/06/13 1408 08/07/13 0625  WBC 3.6* 3.4*  NEUTROABS 2.1 1.5*  HGB 13.0 11.9*  HCT 37.0* 32.6*  MCV 82.4 81.3  PLT 112* 110*   Cardiac Enzymes:  Recent Labs Lab 08/06/13 1852 08/07/13 0625  TROPONINI <0.30 <0.30   BNP (last 3 results)  Recent Labs  08/07/13 0625  PROBNP 16.9   CBG:  Recent Labs Lab 08/07/13 0833 08/07/13 1201 08/07/13 1644 08/07/13 2143 08/08/13 0754  GLUCAP 98 138* 131* 154* 120*    No results found for this or any previous visit (from the  past 240 hour(s)).   Studies: Dg Chest 2 View  08/06/2013   CLINICAL DATA:  Cough, shortness of breath and dizziness.  EXAM: CHEST - 2 VIEW  COMPARISON:  DG RIBS UNILATERAL W/CHEST*R* dated 07/31/2013; DG CHEST 2V dated 04/09/2013  FINDINGS: Lungs show stable chronic disease. There is no evidence of pulmonary edema, consolidation, pneumothorax, nodule or pleural fluid. The heart size is normal. The bony thorax shows mild degenerative disease of the thoracic spine.  IMPRESSION: Stable chronic lung disease.   Electronically Signed   By: Aletta Edouard M.D.   On: 08/06/2013 19:26   Ct Angio Chest Pe W/cm &/or Wo Cm  08/07/2013   CLINICAL DATA:  Multiple myeloma, drop in oxygen saturation. Epigastric pain.  EXAM: CT ANGIOGRAPHY CHEST  WITH CONTRAST  TECHNIQUE: Multidetector CT imaging of the chest was performed using the standard protocol during bolus administration of intravenous contrast. Multiplanar CT image reconstructions including MIPs were obtained to evaluate the vascular anatomy.  CONTRAST:  1 OMNIPAQUE IOHEXOL 300 MG/ML SOLN, 153mL OMNIPAQUE IOHEXOL 350 MG/ML SOLN  COMPARISON:  08/06/2013 chest radiograph and abdominal pelvic CT  FINDINGS: The pulmonary arterial branch vessels are patent.  Normal caliber aorta with mild scattered atherosclerotic disease.  Normal heart size.  Coronary artery calcifications.  Trace pericardial fluid.  No pleural effusion.  Mucus/debris within the right main bronchus and bilateral lower and middle lobe bronchi. Bilateral lower lobe peribronchial thickening. Mild right lower lobe opacity. Mild peripheral reticular opacities. Calcified granuloma left upper and lower lobe.  Upper abdominal images show a 4 cm left hepatic lobe lesion with a peripheral nodular hypervascular enhancement. There may be a smaller lesion within the right hepatic lobe on image 65 series 4.  Multilevel degenerative change. Partially imaged cervical fusion hardware. No acute osseous finding.  Review of  the MIP images confirms the above findings.  IMPRESSION: No pulmonary embolism.  Lower lobe peribronchial thickening and debris within right greater than left lower lobe bronchi. May reflect bronchitis or aspiration pneumonitis.  4 cm left hepatic lobe lesion is favored to reflect a hemangioma. There may be a smaller right hepatic lobe hemangioma versus blush of variant perfusion.   Electronically Signed   By: Carlos Levering M.D.   On: 08/07/2013 00:50   Ct Abdomen Pelvis W Contrast  08/06/2013   CLINICAL DATA:  Left-sided abdominal pain and fever. History of multiple myeloma.  EXAM: CT ABDOMEN AND PELVIS WITH CONTRAST  TECHNIQUE: Multidetector CT imaging of the abdomen and pelvis was performed using the standard protocol following bolus administration of intravenous contrast.  CONTRAST:  136mL OMNIPAQUE IOHEXOL 300 MG/ML  SOLN  COMPARISON:  DG ABD ACUTE W/CHEST dated 02/10/2013; CT ABD/PELV WO CM dated 10/09/2011; US RENAL dated 03/06/2010; MR L SPINE WO/W CM dated 03/23/2012  FINDINGS: Stable hemangioma of the superior left lobe of the liver measures approximately 3.4 cm in diameter. The gallbladder, spleen, pancreas and adrenal glands are within normal limits. Hyperdense lesion of the posterior right kidney shows stable size since prior CT, measuring approximately 2.4 cm in greatest diameter. This is likely a complex/ hemorrhagic cyst. No calculi or renal obstruction identified.  Bowel loops are unremarkable in show no evidence of obstruction or inflammation. Diverticulosis of the colon identified without evidence of diverticulitis. The bladder is unremarkable. No enlarged lymph nodes are seen. The abdominal aorta is of normal caliber. No hernias are identified. The bony thorax shows advanced degenerative disc disease of the lumbar spine without focal bony lesions, fractures or subluxations.  IMPRESSION: No acute findings. Stable hemangioma of the left lobe the liver and complex/hemorrhagic cystic lesion of the  right kidney.   Electronically Signed   By: Aletta Edouard M.D.   On: 08/06/2013 20:53    Scheduled Meds: . acyclovir  400 mg Oral BID  . amLODipine  2.5 mg Oral Daily  . aspirin  325 mg Oral BID  . atorvastatin  20 mg Oral q1800  . carvedilol  3.125 mg Oral BID WC  . ceFEPime (MAXIPIME) IV  2 g Intravenous Once  . ceFEPime (MAXIPIME) IV  2 g Intravenous Q12H  . enoxaparin (LOVENOX) injection  60 mg Subcutaneous Q24H  . fluticasone  2 spray Each Nare Daily  . guaiFENesin  600 mg Oral BID  .  insulin aspart  0-9 Units Subcutaneous TID WC  . ipratropium-albuterol  3 mL Nebulization TID  . pantoprazole  40 mg Oral Daily  . polyethylene glycol  17 g Oral BID  . potassium chloride SA  20 mEq Oral Daily  . sodium chloride  3 mL Intravenous Q12H  . tamsulosin  0.4 mg Oral BID  . vancomycin  1,000 mg Intravenous Q12H   Continuous Infusions:    Principal Problem:   SOB (shortness of breath) Active Problems:   Obstructive sleep apnea   Insomnia with sleep apnea   Multiple myeloma   Renal insufficiency   Essential hypertension, benign   Mixed hyperlipidemia   Dyspnea on exertion   Hypoxia   Pneumonia   Diastolic CHF    Time spent: 40 min    WOODS, [place], J  [facility] Pager [phone]. If 7PM-7AM, please contact night-coverage at [email or web address], password [facility] 08/08/2013, 9:01 AM  LOS: 2 days

## 2013-08-08 NOTE — Evaluation (Addendum)
Clinical/Bedside Swallow Evaluation Patient Details  Name: [name] MRN: [id number] Date of Birth: Jun 09, 1936  Today's Date: 08/08/2013 Time: 6546-5035 SLP Time Calculation (min): 20 min  Past Medical History:  Past Medical History  Diagnosis Date  . GERD (gastroesophageal reflux disease)   . Essential hypertension, benign   . BPH (benign prostatic hyperplasia)   . Lumbar herniated disc     L4-5  . Hyperlipidemia   . Sleep apnea   . Multiple myeloma 02/19/2008  . Arthritis   . Diverticulosis   . Peptic ulcer disease   . Tubular adenoma 02/16/2008    Dr. Silvano Rusk   Past Surgical History:  Past Surgical History  Procedure Laterality Date  . Neck surgery    . Lumbar laminectomy/decompression microdiscectomy  11/01/2011    Procedure: LUMBAR LAMINECTOMY/DECOMPRESSION MICRODISCECTOMY 2 LEVELS;  Surgeon: Charlie Pitter, MD;  Location: [place] [facility] OR[facility];  Service: Neurosurgery;  Laterality: Right;  Lumbar Laminectomy/Microdiscectomy Decompression Lumbar Three-Four, Lumbar Four-Five   . Colonoscopy    . Hemorrhoid banding  2014   HPI:  78 yo male adm to [facility] with SOB, CXR showed debris in lower right bronchi more than left concerning for aspiration pneumonitis.  Pt has multiple myeloma, previous smoker, OSA uses Bipap at night.  Pt also with h/o ACDF C4-C7 in 2011.   Has h/o peptic ulcer dx.    Assessment / Plan / Recommendation Clinical Impression  Pt without focal CN deficit that would impact swallow musculature.  Observed pt to consume graham cracker, applesauce and thin water.  He denies "choking" on intake - admits to occasional issues with sensing food sticking in esophagus that clear with liquid boluses.  He did admit to one episode of sensing pill stuck in throat - causing pt to belch and bring back into oral cavity before re=swallowing.  Denies issues with swallowing after ACDF.  Pt states he has occasional heart burn/reflux.  Pt also admits to issues with constipation for which he  takes Miralax and he suspects may make his reflux worse.    Pt admits to pain when eating in his stomach after he had completely cleared his bowels- note h/o PUD.  He also expressed concern re: possibly getting water into lungs from his Cpap machine.   SLP suspects if pt is aspirating, source may be GI (esophageal, reflux).   Educated pt to general aspiration precautions given his h/o smoking and episodic shortness of breath and compensations for possible esophageal issues.  Using teach back, pt effectively understood all information shared. All education completed, SLP to sign off.      Aspiration Risk  Mild    Diet Recommendation Regular;Thin liquid   Liquid Administration via: Straw;Cup Medication Administration: Whole meds with liquid Supervision: Patient able to self feed Compensations: Slow rate;Small sips/bites (consume liquids t/o meal) Postural Changes and/or Swallow Maneuvers: Seated upright 90 degrees;Upright 30-60 min after meal    Other  Recommendations Oral Care Recommendations: Oral care BID               Swallow Study Prior Functional Status   eats well at home, drinks water t/o the day    General Date of Onset: 08/08/13 HPI: 78 yo male adm to [facility] with SOB, CXR showed debris in lower right bronchi more than left concerning for aspiration pneumonitis.  Pt has multiple myeloma, previous smoker, OSA uses Bipap at night.  Pt also with h/o ACDF C4-C7 in 2011.   Type of Study: Bedside swallow evaluation Diet Prior  to this Study: Regular;Thin liquids Temperature Spikes Noted: No Respiratory Status: Room air History of Recent Intubation: No Behavior/Cognition: Alert;Cooperative;Pleasant mood Oral Cavity - Dentition: Adequate natural dentition Self-Feeding Abilities: Able to feed self (difficulty with right hand due to neck issues per pt) Patient Positioning: Upright in chair Baseline Vocal Quality: Clear Volitional Cough: Strong Volitional Swallow: Able to elicit     Oral/Motor/Sensory Function Overall Oral Motor/Sensory Function: Appears within functional limits for tasks assessed   Ice Chips Ice chips: Not tested   Thin Liquid Thin Liquid: Within functional limits Presentation: Self Fed;Cup;Straw    Nectar Thick Nectar Thick Liquid: Not tested   Honey Thick Honey Thick Liquid: Not tested   Puree Puree: Within functional limits Presentation: Self Fed;Spoon   Solid   GO    Solid: Within functional limits Presentation: [place], [place] [facility] SLP [phone]

## 2013-08-09 ENCOUNTER — Ambulatory Visit: Payer: Self-pay

## 2013-08-09 ENCOUNTER — Other Ambulatory Visit: Payer: Self-pay

## 2013-08-09 DIAGNOSIS — T451X5A Adverse effect of antineoplastic and immunosuppressive drugs, initial encounter: Secondary | ICD-10-CM

## 2013-08-09 DIAGNOSIS — G4730 Sleep apnea, unspecified: Secondary | ICD-10-CM

## 2013-08-09 DIAGNOSIS — G4700 Insomnia, unspecified: Secondary | ICD-10-CM

## 2013-08-09 DIAGNOSIS — D6959 Other secondary thrombocytopenia: Secondary | ICD-10-CM

## 2013-08-09 DIAGNOSIS — J189 Pneumonia, unspecified organism: Secondary | ICD-10-CM

## 2013-08-09 DIAGNOSIS — D6481 Anemia due to antineoplastic chemotherapy: Secondary | ICD-10-CM

## 2013-08-09 LAB — GLUCOSE, CAPILLARY
GLUCOSE-CAPILLARY: 84 mg/dL (ref 70–99)
Glucose-Capillary: 74 mg/dL (ref 70–99)
Glucose-Capillary: 108 mg/dL — ABNORMAL HIGH (ref 70–99)
Glucose-Capillary: 99 mg/dL (ref 70–99)

## 2013-08-09 MED ORDER — LEVOFLOXACIN 750 MG PO TABS: 750.0000 mg | ORAL_TABLET | Freq: Every day | ORAL | Status: DC

## 2013-08-09 MED ORDER — METFORMIN HCL 500 MG PO TABS: 500.0000 mg | ORAL_TABLET | Freq: Two times a day (BID) | ORAL | Status: DC

## 2013-08-09 MED ORDER — ALBUTEROL SULFATE HFA 108 (90 BASE) MCG/ACT IN AERS: 2.0000 | INHALATION_SPRAY | Freq: Four times a day (QID) | RESPIRATORY_TRACT | Status: DC | PRN

## 2013-08-09 MED ORDER — METFORMIN HCL 500 MG PO TABS
500.0000 mg | ORAL_TABLET | Freq: Two times a day (BID) | ORAL | Status: DC
  Administered 2013-08-09: 500 mg via ORAL
  Filled 2013-08-09 (×2): qty 1

## 2013-08-09 MED ORDER — LEVOFLOXACIN 750 MG PO TABS
750.0000 mg | ORAL_TABLET | Freq: Every day | ORAL | Status: DC
  Administered 2013-08-09: 750 mg via ORAL
  Filled 2013-08-09: qty 1

## 2013-08-09 MED ORDER — CARVEDILOL 3.125 MG PO TABS: 3.1250 mg | ORAL_TABLET | Freq: Two times a day (BID) | ORAL | Status: DC

## 2013-08-09 MED ORDER — METRONIDAZOLE IN NACL 5-0.79 MG/ML-% IV SOLN
500.0000 mg | Freq: Three times a day (TID) | INTRAVENOUS | Status: DC
  Filled 2013-08-09 (×2): qty 100

## 2013-08-09 NOTE — Progress Notes (Signed)
Ambulated patient in hall while wearing 2 L/M [place]. Oxygen Saturation ranged from 94-96% on 2 L.

## 2013-08-09 NOTE — Discharge Summary (Signed)
Physician Discharge Summary  [name] QMV:[id number] DOB: 08-Mar-1936 DOA: 08/06/2013  PCP: Anthoney Harada, MD  Admit date: 08/06/2013 Discharge date: 08/09/2013  Time spent: 40 minutes  Recommendations for Outpatient Follow-up:  SOB;  -most likely secondary to aspiration pneumonia  - Discharge home on levofloxacin for 7 day course -Ambulatory SpO2;  O2 sat before ambulating--RA 97% Pulse 89, resp 20  during ambulation SPO2 87-88 % pt asked to rest a minute--SOB noted, heartrate 120-122, resp  22-24,  upon return sitting on side of bed O2 sat return to 92 % heart rate decreasing to base line and resp  20-22 some SOB noted,  -Patient instructed to use 2 L O2 via [place] when active. Requested DME equipment for home to include portable O2 concentrator. -Patient instructed on the use of albuterol rescue inhaler for home use  Aspiration pneumonia  -See SOB  -Complete course of levofloxacin -Followup with PCP in 7 days  Leukopenia and thrombocytopenia  - probably from chemotherapy. Follow CBC.  -Oncology consulted   OSA  - Use home CPAP equipment. Counseled on sequela of not using equipment to include death   Hypertension  - continue home medications.   Hyperlipidemia  - continue home medications.   Multiple myeloma  - Spoke With Dr Betsy Coder (Oncology ) on 1/29 and   Dr Betsy Coder   agree patient was safe for discharge and preferred not to perform chemotherapy while patient inpatient.  -Patient to followup with Dr Betsy Coder (oncology) and schedule chemotherapy   -  History of diabetes mellitus per the charts  -Per ADA guidelines patient falls into prediabetic category and should be started on medication especially given steroids used in treatment of his multiple myeloma - 08/07/2013 hemoglobin A1c= 6.8 .  -Discharge on metformin 500 mg BID -Followup with PCP/oncologist for adjustment of medication  Diastolic CHF  -Discharge on Coreg,  low-dose      Discharge Diagnoses:  Principal Problem:   SOB (shortness of breath) Active Problems:   Obstructive sleep apnea   Insomnia with sleep apnea   Multiple myeloma   Renal insufficiency   Essential hypertension, benign   Mixed hyperlipidemia   Dyspnea on exertion   Hypoxia   Pneumonia   Diastolic CHF   Discharge Condition: Stable  Diet recommendation: Heart healthy  Filed Weights   08/07/13 0222 08/07/13 1614  Weight: 115.667 kg (255 lb) 116.166 kg (256 lb 1.6 oz)    History of present illness:  [name] is a 78 y.o. BM PMHx HTN, HLD, OSA, chronic DOE, renal insufficiency, Hx Multiple Myeloma chemotherapy. Presented to ED for cough for last few days which at times is productive. Denies any fever chills. Yesterday patient became short of breath and had gone to his PCP and was found that patient wasn't hypoxic and patient was sent to the ER. In the ER patient's d-dimer was found to be elevated and patient had CT angiogram of the chest which was negative for PE but does show features concerning for aspiration pneumonia. Patient at this time is not in acute distress and is on 2 L oxygen. Patient otherwise denies any chest pain palpitations dizziness loss of consciousness nausea vomiting abdominal pain. Patient has been having some pain in the right lower lip on the back side which is being followed by Dr. Orpah Clinton, patient's oncologist. States reason he presented to ED with secondary to increasing cough x2 weeks. States wife has a cold. Last chemotherapy was 2 weeks ago. 1/28 patient has not  been seen by Dr. Orpah Clinton (oncologist), and per wife when she spoke with Dr. Orpah Clinton was told that he was never notified that patient was hospitalized. However I informed her I spoken with RN Debbie on 1/27 to the office know that patient was hospitalized. 1/29 no complaints patient ready for discharge. Has been informed that spoke with Dr. Anders Simmonds this a.m. and he does not want to  administer chemotherapy while patient is an inpatient status.   Consultants:  Dr Betsy Coder (Oncology )   Procedures:  CT angiogram chest 08/07/2013  No pulmonary embolism.  Lower lobe peribronchial thickening and debris within right greater  than left lower lobe bronchi. May reflect bronchitis or aspiration  pneumonitis.  4 cm left hepatic lobe lesion is favored to reflect a hemangioma.  There may be a smaller right hepatic lobe hemangioma versus blush of  variant perfusion.   CT abdomen pelvis with contrast 08/06/2013  No acute findings. Stable hemangioma of the left lobe the liver and  complex/hemorrhagic cystic lesion of the right kidney.   CXR 08/06/2013  Stable chronic lung disease.   Echocardiogram 05/30/2013  - Left ventricle: mild LVH. Systolic function was normal.  -LVEF; 60% to 65%.  -(grade 1 diastolic dysfunction). - Aortic valve: Valve area: 2.64cm^2(VTI). Valve area: 2.54cm^2 (Vmax). - Mitral valve: Calcified annulus. Mildly thickened leaflets .- Pericardium, extracardiac: There is an incidental finding of a 3 x 3 cm liver cyst, consider dedicated imaging for further evalution.  Antibiotics:  Acyclovir 1/27>>  Cefepime 1/27>> 1/29 Vancomycin 1/27>> 1/29 Levofloxacin 1/29>>    Discharge Exam: Filed Vitals:   08/08/13 2050 08/08/13 2100 08/09/13 0644 08/09/13 0840  BP:  119/73 119/71   Pulse:  73 78   Temp:  97.4 F (36.3 C) 98.3 F (36.8 C)   TempSrc:  Oral Oral   Resp:  22 22   Height:      Weight:      SpO2: 95% 96% 94% 100%   General: A./O., NAD  Cardiovascular: Regular rhythm and rate, negative murmurs rubs or gallops, DP/PT 1+ bilateral  Respiratory: Clear to auscultation bilateral, decreased air movement all lung lobes/tightness  Abdomen: Soft, nontender, nondistended, plus bowel  Musculoskeletal: Negative pedal edema   Discharge Instructions   Future Appointments Provider Department Dept Phone   08/09/2013 11:00 AM [facility] [phone]   08/09/2013 11:30 AM [facility] [place] [facility] [phone]   08/14/2013 2:00 PM Vernie Shanks, [place] [phone]   08/16/2013 11:00 AM [facility] [place] [facility] [phone]   08/16/2013 11:30 AM [facility] [place] [facility] [phone]   08/21/2013 10:00 AM Herminio Commons, [place] [phone]   08/23/2013 11:00 AM [facility] [phone]   08/23/2013 11:30 AM [facility] [place] [facility] [phone]   09/03/2013 1:30 PM Deneise Lever, MD [facility] [phone]   09/06/2013 11:15 AM [facility] [place] [phone]   09/06/2013 11:45 AM Owens Shark, NP [place] [facility] [phone]   09/06/2013 12:45 PM [facility] [phone]   01/14/2014 9:15 AM Deneise Lever, MD [facility] [phone]       Medication List    ASK your doctor about these medications  acyclovir 400 MG tablet  Commonly known as:  ZOVIRAX  Take 400 mg by mouth 2 (two) times daily.     amLODipine 2.5 MG tablet  Commonly known as:  NORVASC  Take 1 tablet (2.5 mg total) by mouth daily.     aspirin 325 MG EC tablet  Take 1 tablet (325 mg total) by mouth 2 (two) times daily. Do not take again until March 20, 2013     atorvastatin 20 MG tablet  Commonly known as:  LIPITOR  Take 20 mg by mouth daily.     cyclophosphamide 50 MG tablet  Commonly known as:  CYTOXAN  Take 600 mg by mouth. Every Thursday as directed.  Give on an empty stomach 1 hour before or 2 hours after meals.     dexamethasone 4 MG tablet  Commonly known as:  DECADRON  Take 20 mg by mouth. Take 5 tablets on  Thursdays when you are not getting chemotherapy.     fluticasone 50 MCG/ACT nasal spray  Commonly known as:  FLONASE  Place 2 sprays into both nostrils daily.     lactulose 10 GM/15ML solution  Commonly known as:  CHRONULAC  Take 10 g by mouth 2 (two) times daily as needed.     NITROSTAT 0.4 MG SL tablet  Generic drug:  nitroGLYCERIN  Place 0.4 mg under the tongue as needed for chest pain.     omeprazole 20 MG capsule  Commonly known as:  PRILOSEC  Take 20 mg by mouth daily.     polyethylene glycol packet  Commonly known as:  MIRALAX / GLYCOLAX  Take 17 g by mouth 2 (two) times daily.     potassium chloride SA 20 MEQ tablet  Commonly known as:  KLOR-CON M20  Take 1 tablet (20 mEq total) by mouth daily.     PRESCRIPTION MEDICATION  Inject as directed every 7 (seven) days. Velcade $RemoveBefor'3mg'[id number]$  injection q7d on Thursdays, skips every 4th week.     prochlorperazine 10 MG tablet  Commonly known as:  COMPAZINE  Take 10 mg by mouth every 6 (six) hours as needed (for nausea).     tamsulosin 0.4 MG Caps capsule  Commonly known as:  FLOMAX  Take 0.4 mg by mouth 2 (two) times daily. With a  Meal.     zolpidem 5 MG tablet  Commonly known as:  AMBIEN  Take 5 mg by mouth at bedtime as needed for sleep.       Allergies  Allergen Reactions  . Penicillins Hives and Rash      The results of significant diagnostics from this hospitalization (including imaging, microbiology, ancillary and laboratory) are listed below for reference.    Significant Diagnostic Studies: Dg Chest 2 View  08/06/2013   CLINICAL DATA:  Cough, shortness of breath and dizziness.  EXAM: CHEST - 2 VIEW  COMPARISON:  DG RIBS UNILATERAL W/CHEST*R* dated 07/31/2013; DG CHEST 2V dated 04/09/2013  FINDINGS: Lungs show stable chronic disease. There is no evidence of pulmonary edema, consolidation, pneumothorax, nodule or pleural fluid. The heart size is normal. The bony thorax shows mild degenerative disease of the thoracic  spine.  IMPRESSION: Stable chronic lung disease.   Electronically Signed   By: Aletta Edouard M.D.   On: 08/06/2013 19:26   Dg Ribs Unilateral W/chest Right  07/31/2013   CLINICAL DATA:  Right back and anterior chest pain. Question rib fracture or myeloma. History of multiple myeloma.  EXAM: RIGHT RIBS AND CHEST - 3+ VIEW  COMPARISON:  Chest radiograph 02/10/2013  FINDINGS: There is a questionable lucent expansile lesion in the anterolateral aspect of the right sixth rib, best seen on the view labeled: "with ribs AP/PA lower". This apparent lesion measures approximate 1.8 cm greatest diameter. No acute or healing rib fracture is identified. The heart mediastinal contours are normal. The lungs are clear. There are postsurgical changes of the lower cervical spine.  IMPRESSION: Possible lytic lesion in the anterolateral right sixth a rib. Findings are suspicious for myeloma, but are subtle and only seen well on one view.   Electronically Signed   By: Curlene Dolphin M.D.   On: 07/31/2013 16:35   Dg Thoracic Spine 2 View  07/31/2013   CLINICAL DATA:  Back and anterior chest pain. Question compression fracture or myeloma.  EXAM: THORACIC SPINE - 2 VIEW  COMPARISON:  Right rib series same day. Chest radiograph done 03/31/2013.  FINDINGS: Thoracic spine vertebral bodies are normal in height and alignment. No acute fracture or focal lesion is visualized radiographically. There are mild typical degenerative changes for patient age. Anterior cervical discectomy and fusion spans C4 through C7.  IMPRESSION: No acute findings of the thoracic spine.   Electronically Signed   By: Curlene Dolphin M.D.   On: 07/31/2013 16:37   Ct Angio Chest Pe W/cm &/or Wo Cm  08/07/2013   CLINICAL DATA:  Multiple myeloma, drop in oxygen saturation. Epigastric pain.  EXAM: CT ANGIOGRAPHY CHEST WITH CONTRAST  TECHNIQUE: Multidetector CT imaging of the chest was performed using the standard protocol during bolus administration of intravenous  contrast. Multiplanar CT image reconstructions including MIPs were obtained to evaluate the vascular anatomy.  CONTRAST:  1 OMNIPAQUE IOHEXOL 300 MG/ML SOLN, 159mL OMNIPAQUE IOHEXOL 350 MG/ML SOLN  COMPARISON:  08/06/2013 chest radiograph and abdominal pelvic CT  FINDINGS: The pulmonary arterial branch vessels are patent.  Normal caliber aorta with mild scattered atherosclerotic disease.  Normal heart size.  Coronary artery calcifications.  Trace pericardial fluid.  No pleural effusion.  Mucus/debris within the right main bronchus and bilateral lower and middle lobe bronchi. Bilateral lower lobe peribronchial thickening. Mild right lower lobe opacity. Mild peripheral reticular opacities. Calcified granuloma left upper and lower lobe.  Upper abdominal images show a 4 cm left hepatic lobe lesion with a peripheral nodular hypervascular enhancement. There may be a smaller lesion within the right hepatic lobe on image 65 series 4.  Multilevel degenerative change. Partially imaged cervical fusion hardware. No acute osseous finding.  Review of the MIP images confirms the above findings.  IMPRESSION: No pulmonary embolism.  Lower lobe peribronchial thickening and debris within right greater than left lower lobe bronchi. May reflect bronchitis or aspiration pneumonitis.  4 cm left hepatic lobe lesion is favored to reflect a hemangioma. There may be a smaller right hepatic lobe hemangioma versus blush of variant perfusion.   Electronically Signed   By: Carlos Levering M.D.   On: 08/07/2013 00:50   Ct Abdomen Pelvis W Contrast  08/06/2013   CLINICAL DATA:  Left-sided abdominal pain and fever. History of multiple myeloma.  EXAM: CT ABDOMEN AND PELVIS WITH CONTRAST  TECHNIQUE: Multidetector CT imaging of the abdomen and pelvis was performed using the standard protocol following bolus administration of intravenous contrast.  CONTRAST:  120mL OMNIPAQUE IOHEXOL 300 MG/ML  SOLN  COMPARISON:  DG ABD ACUTE W/CHEST dated 02/10/2013;  CT ABD/PELV WO CM dated 10/09/2011; US RENAL dated 03/06/2010; MR L SPINE WO/W CM dated 03/23/2012  FINDINGS: Stable hemangioma of  the superior left lobe of the liver measures approximately 3.4 cm in diameter. The gallbladder, spleen, pancreas and adrenal glands are within normal limits. Hyperdense lesion of the posterior right kidney shows stable size since prior CT, measuring approximately 2.4 cm in greatest diameter. This is likely a complex/ hemorrhagic cyst. No calculi or renal obstruction identified.  Bowel loops are unremarkable in show no evidence of obstruction or inflammation. Diverticulosis of the colon identified without evidence of diverticulitis. The bladder is unremarkable. No enlarged lymph nodes are seen. The abdominal aorta is of normal caliber. No hernias are identified. The bony thorax shows advanced degenerative disc disease of the lumbar spine without focal bony lesions, fractures or subluxations.  IMPRESSION: No acute findings. Stable hemangioma of the left lobe the liver and complex/hemorrhagic cystic lesion of the right kidney.   Electronically Signed   By: Aletta Edouard M.D.   On: 08/06/2013 20:53    Microbiology: No results found for this or any previous visit (from the past 240 hour(s)).   Labs: Basic Metabolic Panel:  Recent Labs Lab 08/06/13 1408 08/07/13 0625 08/08/13 0940  NA 139 139 138  K 4.0 3.7 3.8  CL 102 103 101  CO2 $Re'24 24 22  'vQu$ GLUCOSE 100* 96 177*  BUN $Re'12 11 8  'PTS$ CREATININE 0.99 0.78 0.80  CALCIUM 8.7 8.5 8.8  MG  --   --  1.6   Liver Function Tests:  Recent Labs Lab 08/06/13 1910 08/07/13 0625 08/08/13 0940  AST 33 27 28  ALT $Re'14 11 11  'Shl$ ALKPHOS 46 42 45  BILITOT 0.4 0.5 0.5  PROT 6.5 6.2 6.2  ALBUMIN 3.5 3.1* 3.2*    Recent Labs Lab 08/06/13 1852  LIPASE 42   No results found for this basename: AMMONIA,  in the last 168 hours CBC:  Recent Labs Lab 08/06/13 1408 08/07/13 0625 08/08/13 0940  WBC 3.6* 3.4* 3.7*  NEUTROABS 2.1 1.5*  2.3  HGB 13.0 11.9* 11.1*  HCT 37.0* 32.6* 32.8*  MCV 82.4 81.3 81.6  PLT 112* 110* 112*   Cardiac Enzymes:  Recent Labs Lab 08/06/13 1852 08/07/13 0625  TROPONINI <0.30 <0.30   BNP: BNP (last 3 results)  Recent Labs  08/07/13 0625  PROBNP 16.9   CBG:  Recent Labs Lab 08/08/13 0754 08/08/13 1115 08/08/13 1641 08/08/13 2210 08/09/13 0754  GLUCAP 120* 113* 137* 74 108*       Signed:  Dia Crawford, MD [facility] [phone] pager

## 2013-08-09 NOTE — Progress Notes (Signed)
IP PROGRESS NOTE  Subjective:   [name] was on 08/08/2015 with shortness of breath. A CT showed evidence of pneumonia in the right lower lung. He reports continued dyspnea. Mild discomfort in the lower chest bilaterally. He is ambulating.  Objective: Vital signs in last 24 hours: Blood pressure 119/71, pulse 78, temperature 98.3 F (36.8 C), temperature source Oral, resp. rate 22, height 6' (1.829 m), weight 256 lb 1.6 oz (116.166 kg), SpO2 100.00%.  Intake/Output from previous day: 01/28 0701 - 01/29 0700 In: 1100 [P.O.:600; IV Piggyback:500] Out: 1848 [Urine:1590; Stool:1]  Physical Exam:  HEENT: No thrush Lungs: Mild wheezing on expiration at the lower right posterior chest, good air movement bilaterally, no respiratory distress Cardiac: Regular rate and rhythm Abdomen: No hepatosplenomegaly Extremities: No leg edema   Portacath/PICC-without erythema  Lab Results:  Recent Labs  08/07/13 0625 08/08/13 0940  WBC 3.4* 3.7*  HGB 11.9* 11.1*  HCT 32.6* 32.8*  PLT 110* 112*   ANC 2.3 on 08/08/2013  BMET  Recent Labs  08/07/13 0625 08/08/13 0940  NA 139 138  K 3.7 3.8  CL 103 101  CO2 24 22  GLUCOSE 96 177*  BUN 11 8  CREATININE 0.78 0.80  CALCIUM 8.5 8.8    Studies/Results: No results found.  Medications: I have reviewed the patient's current medications.  Assessment/Plan: 1. Multiple myeloma-recent stabilization of the serum M spike and IgG level. He will continue maintenance therapy with Velcade/Decadron  2. Dyspnea-most likely related to COPD, currently being treated for "pneumonia ".  3. Anemia/thrombocytopenia secondary to multiple myeloma and chemotherapy  [name] appears stable from a hematologic standpoint. He will continue outpatient treatment for the multiple myeloma.  Recommendations:  1. Return as scheduled for a Velcade injection on 08/16/2013 2. Outpatient followup at the [facility] as scheduled 3. Management of dyspnea per  medicine service   LOS: 3 days   [name]  08/09/2013, 10:19 AM

## 2013-08-10 ENCOUNTER — Telehealth: Payer: Self-pay | Admitting: *Deleted

## 2013-08-10 NOTE — Telephone Encounter (Signed)
Wife called to schedule follow up appointment. Confirmed with Dr. Benay Spice that current schedule is adequate. Next office visit 2/26 with weekly Velcade starting 2/5. Reminded wife to hold Cytoxan.

## 2013-08-14 ENCOUNTER — Encounter: Payer: Self-pay | Admitting: Family Medicine

## 2013-08-14 ENCOUNTER — Telehealth: Payer: Self-pay | Admitting: *Deleted

## 2013-08-14 ENCOUNTER — Ambulatory Visit (INDEPENDENT_AMBULATORY_CARE_PROVIDER_SITE_OTHER): Payer: Medicare HMO | Admitting: Family Medicine

## 2013-08-14 VITALS — BP 112/75 | HR 93 | Temp 97.8°F | Ht 72.0 in | Wt 252.0 lb

## 2013-08-14 DIAGNOSIS — I10 Essential (primary) hypertension: Secondary | ICD-10-CM

## 2013-08-14 DIAGNOSIS — G4730 Sleep apnea, unspecified: Secondary | ICD-10-CM

## 2013-08-14 DIAGNOSIS — J189 Pneumonia, unspecified organism: Secondary | ICD-10-CM

## 2013-08-14 DIAGNOSIS — I509 Heart failure, unspecified: Secondary | ICD-10-CM

## 2013-08-14 DIAGNOSIS — M5116 Intervertebral disc disorders with radiculopathy, lumbar region: Secondary | ICD-10-CM

## 2013-08-14 DIAGNOSIS — E782 Mixed hyperlipidemia: Secondary | ICD-10-CM

## 2013-08-14 DIAGNOSIS — N289 Disorder of kidney and ureter, unspecified: Secondary | ICD-10-CM

## 2013-08-14 DIAGNOSIS — G4700 Insomnia, unspecified: Secondary | ICD-10-CM

## 2013-08-14 DIAGNOSIS — C9000 Multiple myeloma not having achieved remission: Secondary | ICD-10-CM

## 2013-08-14 DIAGNOSIS — M5126 Other intervertebral disc displacement, lumbar region: Secondary | ICD-10-CM

## 2013-08-14 DIAGNOSIS — G4733 Obstructive sleep apnea (adult) (pediatric): Secondary | ICD-10-CM

## 2013-08-14 DIAGNOSIS — K219 Gastro-esophageal reflux disease without esophagitis: Secondary | ICD-10-CM | POA: Insufficient documentation

## 2013-08-14 DIAGNOSIS — I5030 Unspecified diastolic (congestive) heart failure: Secondary | ICD-10-CM

## 2013-08-14 MED ORDER — PANTOPRAZOLE SODIUM 40 MG PO TBEC: 40.0000 mg | DELAYED_RELEASE_TABLET | Freq: Every day | ORAL | Status: DC

## 2013-08-14 NOTE — Telephone Encounter (Signed)
Pt's wife returned call. Informed her that Dr. Benay Spice wants him to keep chemo appts beginning  2/5. Next office 2/26. She voiced understanding.

## 2013-08-14 NOTE — Progress Notes (Signed)
Patient ID: [name], male   DOB: [birth year], 78 y.o.   MRN: [id number] SUBJECTIVE: CC: Chief Complaint  Patient presents with  . Hospitalization Follow-up    post hospital and 2 month follow up     HPI: Here for post hospital follow up for aspiration pneumonia. Doing the same. On O2 via [place]. Has GERD. Bed is not elevated. The omeprazole is not working as well.  Other problems  Stable. Multiple myeloma under treatment with Dr Truett Perna.   Past Medical History  Diagnosis Date  . GERD (gastroesophageal reflux disease)   . Essential hypertension, benign   . BPH (benign prostatic hyperplasia)   . Lumbar herniated disc     L4-5  . Hyperlipidemia   . Sleep apnea   . Multiple myeloma 02/19/2008  . Arthritis   . Diverticulosis   . Peptic ulcer disease   . Tubular adenoma 02/16/2008    Dr. Stan Head   Past Surgical History  Procedure Laterality Date  . Neck surgery    . Lumbar laminectomy/decompression microdiscectomy  11/01/2011    Procedure: LUMBAR LAMINECTOMY/DECOMPRESSION MICRODISCECTOMY 2 LEVELS;  Surgeon: Temple Pacini, MD;  Location: [facility];  Service: Neurosurgery;  Laterality: Right;  Lumbar Laminectomy/Microdiscectomy Decompression Lumbar Three-Four, Lumbar Four-Five   . Colonoscopy    . Hemorrhoid banding  2014   History   Social History  . Marital Status: Married    Spouse Name: N/A    Number of Children: 4  . Years of Education: N/A   Occupational History  . retired-disabled-Construction    Social History Main Topics  . Smoking status: Former Smoker -- 2.00 packs/day for 20 years    Types: Cigarettes    Quit date: 07/12/1990  . Smokeless tobacco: Never Used  . Alcohol Use: No  . Drug Use: No  . Sexual Activity: No   Other Topics Concern  . Not on file   Social History Narrative   Pt was adopted.   Married   Probation officer   Family History  Problem Relation Age of Onset  . Adopted: Yes   Current Outpatient Prescriptions on File Prior to  Visit  Medication Sig Dispense Refill  . acyclovir (ZOVIRAX) 400 MG tablet Take 400 mg by mouth 2 (two) times daily.      Marland Kitchen albuterol (PROVENTIL HFA) 108 (90 BASE) MCG/ACT inhaler Inhale 2 puffs into the lungs every 6 (six) hours as needed for wheezing or shortness of breath.  1 Inhaler  2  . amLODipine (NORVASC) 2.5 MG tablet Take 1 tablet (2.5 mg total) by mouth daily.  30 tablet  3  . aspirin 325 MG EC tablet Take 1 tablet (325 mg total) by mouth 2 (two) times daily. Do not take again until March 20, 2013      . atorvastatin (LIPITOR) 20 MG tablet Take 20 mg by mouth daily.      . carvedilol (COREG) 3.125 MG tablet Take 1 tablet (3.125 mg total) by mouth 2 (two) times daily with a meal.  60 tablet  0  . dexamethasone (DECADRON) 4 MG tablet Take 20 mg by mouth. Take 5 tablets on Thursdays when you are not getting chemotherapy.      . fluticasone (FLONASE) 50 MCG/ACT nasal spray Place 2 sprays into both nostrils daily.  16 g  6  . lactulose (CHRONULAC) 10 GM/15ML solution Take 10 g by mouth 2 (two) times daily as needed.      Marland Kitchen levofloxacin (LEVAQUIN) 750 MG tablet Take  1 tablet (750 mg total) by mouth daily.  7 tablet  0  . metFORMIN (GLUCOPHAGE) 500 MG tablet Take 1 tablet (500 mg total) by mouth 2 (two) times daily with a meal.  60 tablet  0  . NITROSTAT 0.4 MG SL tablet Place 0.4 mg under the tongue as needed for chest pain.       . polyethylene glycol (MIRALAX / GLYCOLAX) packet Take 17 g by mouth 2 (two) times daily.       . potassium chloride SA (KLOR-CON M20) 20 MEQ tablet Take 1 tablet (20 mEq total) by mouth daily.  30 tablet  2  . PRESCRIPTION MEDICATION Inject as directed every 7 (seven) days. Velcade $RemoveBefor'3mg'eYWFQxkPfdSC$  injection q7d on Thursdays, skips every 4th week.      . prochlorperazine (COMPAZINE) 10 MG tablet Take 10 mg by mouth every 6 (six) hours as needed (for nausea).      . tamsulosin (FLOMAX) 0.4 MG CAPS capsule Take 0.4 mg by mouth 2 (two) times daily. With a  Meal.      . zolpidem  (AMBIEN) 5 MG tablet Take 5 mg by mouth at bedtime as needed for sleep.        No current facility-administered medications on file prior to visit.   Allergies  Allergen Reactions  . Penicillins Hives and Rash   Immunization History  Administered Date(s) Administered  . Influenza Split 04/23/2011, 04/11/2012  . Influenza,inj,Quad PF,36+ Mos 04/09/2013  . Tdap 07/13/2007   Prior to Admission medications   Medication Sig Start Date End Date Taking? Authorizing Provider  acyclovir (ZOVIRAX) 400 MG tablet Take 400 mg by mouth 2 (two) times daily.    Historical Provider, MD  albuterol (PROVENTIL HFA) 108 (90 BASE) MCG/ACT inhaler Inhale 2 puffs into the lungs every 6 (six) hours as needed for wheezing or shortness of breath. 08/09/13   Allie Bossier, MD  amLODipine (NORVASC) 2.5 MG tablet Take 1 tablet (2.5 mg total) by mouth daily. 05/11/13   Vernie Shanks, MD  aspirin 325 MG EC tablet Take 1 tablet (325 mg total) by mouth 2 (two) times daily. Do not take again until March 20, 2013 03/06/13   Gatha Mayer, MD  atorvastatin (LIPITOR) 20 MG tablet Take 20 mg by mouth daily.    Historical Provider, MD  carvedilol (COREG) 3.125 MG tablet Take 1 tablet (3.125 mg total) by mouth 2 (two) times daily with a meal. 08/09/13   Allie Bossier, MD  dexamethasone (DECADRON) 4 MG tablet Take 20 mg by mouth. Take 5 tablets on Thursdays when you are not getting chemotherapy.    Historical Provider, MD  fluticasone (FLONASE) 50 MCG/ACT nasal spray Place 2 sprays into both nostrils daily. 06/11/13   Vernie Shanks, MD  lactulose (CHRONULAC) 10 GM/15ML solution Take 10 g by mouth 2 (two) times daily as needed. 07/31/13   Owens Shark, NP  levofloxacin (LEVAQUIN) 750 MG tablet Take 1 tablet (750 mg total) by mouth daily. 08/09/13   Allie Bossier, MD  metFORMIN (GLUCOPHAGE) 500 MG tablet Take 1 tablet (500 mg total) by mouth 2 (two) times daily with a meal. 08/09/13   Allie Bossier, MD  NITROSTAT 0.4 MG SL  tablet Place 0.4 mg under the tongue as needed for chest pain.  12/07/12   Historical Provider, MD  omeprazole (PRILOSEC) 20 MG capsule Take 20 mg by mouth daily.    Historical Provider, MD  polyethylene glycol (MIRALAX / GLYCOLAX) packet  Take 17 g by mouth 2 (two) times daily.     Historical Provider, MD  potassium chloride SA (KLOR-CON M20) 20 MEQ tablet Take 1 tablet (20 mEq total) by mouth daily. 06/26/13   Vernie Shanks, MD  PRESCRIPTION MEDICATION Inject as directed every 7 (seven) days. Velcade $RemoveBefor'3mg'yIlbYnxTCUeP$  injection q7d on Thursdays, skips every 4th week.    Historical Provider, MD  prochlorperazine (COMPAZINE) 10 MG tablet Take 10 mg by mouth every 6 (six) hours as needed (for nausea).    Historical Provider, MD  tamsulosin (FLOMAX) 0.4 MG CAPS capsule Take 0.4 mg by mouth 2 (two) times daily. With a  Meal.    Historical Provider, MD  zolpidem (AMBIEN) 5 MG tablet Take 5 mg by mouth at bedtime as needed for sleep.  04/05/13   Historical Provider, MD     ROS: As above in the HPI. All other systems are stable or negative.  OBJECTIVE: APPEARANCE:  Patient in no acute distress.The patient appeared well nourished and normally developed. Acyanotic. Waist: VITAL SIGNS:BP 112/75  Pulse 93  Temp(Src) 97.8 F (36.6 C) (Oral)  Ht 6' (1.829 m)  Wt 252 lb (114.306 kg)  BMI 34.17 kg/m2  SpO2 96%  Obese AAM  SKIN: warm and  Dry without overt rashes, tattoos and scars  HEAD and Neck: without JVD, Head and scalp: normal Eyes:No scleral icterus. Fundi normal, eye movements normal. Ears: Auricle normal, canal normal, Tympanic membranes normal, insufflation normal. Nose: normal Throat: normal Neck & thyroid: normal  CHEST & LUNGS: Chest wall: normal Lungs: Coarse. Few crackles in RLL.  CVS: Reveals the PMI to be normally located. Regular rhythm, First and Second Heart sounds are normal,  absence of murmurs, rubs or gallops. Peripheral vasculature: Radial pulses: normal Dorsal pedis  pulses: normal Posterior pulses: normal  ABDOMEN:  Appearance: Obese Benign, no organomegaly, no masses, no Abdominal Aortic enlargement. No Guarding , no rebound. No Bruits. Bowel sounds: normal  RECTAL: N/A GU: N/A  EXTREMETIES: nonedematous.   NEUROLOGIC: oriented to time,place and person; nonfocal.   ASSESSMENT:  Pneumonia  GERD (gastroesophageal reflux disease) - Plan: pantoprazole (PROTONIX) 40 MG tablet  Multiple myeloma  Essential hypertension, benign  Renal insufficiency  Obstructive sleep apnea  Mixed hyperlipidemia  Lumbar disc herniation with radiculopathy  Insomnia with sleep apnea  Diastolic CHF Aspiration pneumonia. Has uncontrolled GERD.  PLAN: Optimize GERD medication GERD precautions. Discussed. Elevate the head of the bed 6 inches. Continue antibiotics until finished. No orders of the defined types were placed in this encounter.   Meds ordered this encounter  Medications  . Cyclophosphamide 50 MG CAPS    Sig:   . pantoprazole (PROTONIX) 40 MG tablet    Sig: Take 1 tablet (40 mg total) by mouth daily.    Dispense:  30 tablet    Refill:  3   Medications Discontinued During This Encounter  Medication Reason  . omeprazole (PRILOSEC) 20 MG capsule Change in therapy   Return in about 2 weeks (around 08/28/2013) for Recheck medical problems.  Horice Carrero P. Jacelyn Grip, M.D.

## 2013-08-16 ENCOUNTER — Ambulatory Visit (HOSPITAL_BASED_OUTPATIENT_CLINIC_OR_DEPARTMENT_OTHER): Payer: Medicare HMO

## 2013-08-16 ENCOUNTER — Other Ambulatory Visit (HOSPITAL_BASED_OUTPATIENT_CLINIC_OR_DEPARTMENT_OTHER): Payer: Medicare HMO

## 2013-08-16 ENCOUNTER — Other Ambulatory Visit: Payer: Self-pay | Admitting: Oncology

## 2013-08-16 VITALS — BP 127/74 | HR 93 | Temp 98.2°F | Resp 18

## 2013-08-16 DIAGNOSIS — Z5112 Encounter for antineoplastic immunotherapy: Secondary | ICD-10-CM

## 2013-08-16 DIAGNOSIS — C9000 Multiple myeloma not having achieved remission: Secondary | ICD-10-CM

## 2013-08-16 LAB — CBC WITH DIFFERENTIAL/PLATELET
BASO%: 1 % (ref 0.0–2.0)
BASOS ABS: 0.1 10*3/uL (ref 0.0–0.1)
EOS%: 2.4 % (ref 0.0–7.0)
Eosinophils Absolute: 0.1 10*3/uL (ref 0.0–0.5)
HCT: 38.3 % — ABNORMAL LOW (ref 38.4–49.9)
HEMOGLOBIN: 13.1 g/dL (ref 13.0–17.1)
LYMPH%: 15.3 % (ref 14.0–49.0)
MCH: 29.6 pg (ref 27.2–33.4)
MCHC: 34.1 g/dL (ref 32.0–36.0)
MCV: 86.9 fL (ref 79.3–98.0)
MONO#: 1.1 10*3/uL — ABNORMAL HIGH (ref 0.1–0.9)
MONO%: 20.6 % — AB (ref 0.0–14.0)
NEUT%: 60.7 % (ref 39.0–75.0)
NEUTROS ABS: 3.2 10*3/uL (ref 1.5–6.5)
Platelets: 138 10*3/uL — ABNORMAL LOW (ref 140–400)
RBC: 4.41 10*6/uL (ref 4.20–5.82)
RDW: 17.1 % — ABNORMAL HIGH (ref 11.0–14.6)
WBC: 5.3 10*3/uL (ref 4.0–10.3)
lymph#: 0.8 10*3/uL — ABNORMAL LOW (ref 0.9–3.3)

## 2013-08-16 MED ORDER — BORTEZOMIB CHEMO SQ INJECTION 3.5 MG (2.5MG/ML)
1.3000 mg/m2 | Freq: Once | INTRAMUSCULAR | Status: AC
  Administered 2013-08-16: 3 mg via SUBCUTANEOUS
  Filled 2013-08-16: qty 3

## 2013-08-16 MED ORDER — DEXAMETHASONE 4 MG PO TABS
ORAL_TABLET | ORAL | Status: AC
Start: 2013-08-16 — End: 2013-08-16
  Filled 2013-08-16: qty 5

## 2013-08-16 MED ORDER — ONDANSETRON HCL 8 MG PO TABS
8.0000 mg | ORAL_TABLET | Freq: Once | ORAL | Status: AC
  Administered 2013-08-16: 8 mg via ORAL

## 2013-08-16 MED ORDER — DEXAMETHASONE 4 MG PO TABS
20.0000 mg | ORAL_TABLET | Freq: Once | ORAL | Status: AC
Start: 2013-08-16 — End: 2013-08-16
  Administered 2013-08-16: 20 mg via ORAL

## 2013-08-16 MED ORDER — ONDANSETRON HCL 8 MG PO TABS
ORAL_TABLET | ORAL | Status: AC
  Filled 2013-08-16: qty 1

## 2013-08-16 NOTE — Patient Instructions (Signed)
[facility] Discharge Instructions for Patients Receiving Chemotherapy  Today you received the following chemotherapy agent velcade injection.  To help prevent nausea and vomiting after your treatment, we encourage you to take your nausea medication.   If you develop nausea and vomiting that is not controlled by your nausea medication, call the clinic.   BELOW ARE SYMPTOMS THAT SHOULD BE REPORTED IMMEDIATELY:  *FEVER GREATER THAN 100.5 F  *CHILLS WITH OR WITHOUT FEVER  NAUSEA AND VOMITING THAT IS NOT CONTROLLED WITH YOUR NAUSEA MEDICATION  *UNUSUAL SHORTNESS OF BREATH  *UNUSUAL BRUISING OR BLEEDING  TENDERNESS IN MOUTH AND THROAT WITH OR WITHOUT PRESENCE OF ULCERS  *URINARY PROBLEMS  *BOWEL PROBLEMS  UNUSUAL RASH Items with * indicate a potential emergency and should be followed up as soon as possible.  Feel free to call the clinic you have any questions or concerns. The clinic phone number is [phone].   Bortezomib injection What is this medicine? BORTEZOMIB (bor TEZ oh mib) is a chemotherapy drug. It slows the growth of cancer cells. This medicine is used to treat multiple myeloma, lymphoma, and other cancers. This medicine may be used for other purposes; ask your health care provider or pharmacist if you have questions. COMMON BRAND NAME(S): Velcade What should I tell my health care provider before I take this medicine? They need to know if you have any of these conditions: -heart disease -irregular heartbeat -liver disease -low blood counts, like low white blood cells, platelets, or hemoglobin -peripheral neuropathy -taking medicine for blood pressure -an unusual or allergic reaction to bortezomib, mannitol, boron, other medicines, foods, dyes, or preservatives -pregnant or trying to get pregnant -breast-feeding How should I use this medicine? This medicine is for injection into a vein or for injection under the skin. It is given by a health  care professional in a hospital or clinic setting. Talk to your pediatrician regarding the use of this medicine in children. Special care may be needed. Overdosage: If you think you have taken too much of this medicine contact a poison control center or emergency room at once. NOTE: This medicine is only for you. Do not share this medicine with others. What if I miss a dose? It is important not to miss your dose. Call your doctor or health care professional if you are unable to keep an appointment. What may interact with this medicine? -medicines for diabetes -medicines to increase blood counts like filgrastim, pegfilgrastim, sargramostim -zalcitabine Talk to your doctor or health care professional before taking any of these medicines: -acetaminophen -aspirin -ibuprofen -ketoprofen -naproxen This list may not describe all possible interactions. Give your health care provider a list of all the medicines, herbs, non-prescription drugs, or dietary supplements you use. Also tell them if you smoke, drink alcohol, or use illegal drugs. Some items may interact with your medicine. What should I watch for while using this medicine? Visit your doctor for checks on your progress. This drug may make you feel generally unwell. This is not uncommon, as chemotherapy can affect healthy cells as well as cancer cells. Report any side effects. Continue your course of treatment even though you feel ill unless your doctor tells you to stop. You may get drowsy or dizzy. Do not drive, use machinery, or do anything that needs mental alertness until you know how this medicine affects you. Do not stand or sit up quickly, especially if you are an older patient. This reduces the risk of dizzy or fainting spells. In some  cases, you may be given additional medicines to help with side effects. Follow all directions for their use. Call your doctor or health care professional for advice if you get a fever, chills or sore  throat, or other symptoms of a cold or flu. Do not treat yourself. This drug decreases your body's ability to fight infections. Try to avoid being around people who are sick. This medicine may increase your risk to bruise or bleed. Call your doctor or health care professional if you notice any unusual bleeding. Be careful brushing and flossing your teeth or using a toothpick because you may get an infection or bleed more easily. If you have any dental work done, tell your dentist you are receiving this medicine. Avoid taking products that contain aspirin, acetaminophen, ibuprofen, naproxen, or ketoprofen unless instructed by your doctor. These medicines may hide a fever. Do not become pregnant while taking this medicine. Women should inform their doctor if they wish to become pregnant or think they might be pregnant. There is a potential for serious side effects to an unborn child. Talk to your health care professional or pharmacist for more information. Do not breast-feed an infant while taking this medicine. You may have vomiting or diarrhea while taking this medicine. Drink water or other fluids as directed. What side effects may I notice from receiving this medicine? Side effects that you should report to your doctor or health care professional as soon as possible: -allergic reactions like skin rash, itching or hives, swelling of the face, lips, or tongue -breathing problems -changes in hearing -changes in vision -fast, irregular heartbeat -feeling faint or lightheaded, falls -pain, tingling, numbness in the hands or feet -seizures -swelling of the ankles, feet, hands -unusual bleeding or bruising -unusually weak or tired -vomiting Side effects that usually do not require medical attention (report to your doctor or health care professional if they continue or are bothersome): -changes in emotions or moods -constipation -diarrhea -loss of appetite -headache -irritation at site where  injected -nausea This list may not describe all possible side effects. Call your doctor for medical advice about side effects. You may report side effects to FDA at 1-800-FDA-1088. Where should I keep my medicine? This drug is given in a hospital or clinic and will not be stored at home. NOTE: This sheet is a summary. It may not cover all possible information. If you have questions about this medicine, talk to your doctor, pharmacist, or health care provider.  2014, [employer]vier/Gold Standard. (2010-08-05 11:42:36)

## 2013-08-19 ENCOUNTER — Other Ambulatory Visit: Payer: Self-pay | Admitting: Oncology

## 2013-08-20 ENCOUNTER — Telehealth: Payer: Self-pay | Admitting: Internal Medicine

## 2013-08-20 ENCOUNTER — Other Ambulatory Visit: Payer: Self-pay | Admitting: Family Medicine

## 2013-08-20 ENCOUNTER — Telehealth: Payer: Self-pay | Admitting: *Deleted

## 2013-08-20 NOTE — Telephone Encounter (Signed)
VM asking for office to call [employer] at [phone] to "release his shot". Reports she was told office needs to call and provide information.  Forwarded call to Carmelina Noun, managed care.

## 2013-08-21 ENCOUNTER — Ambulatory Visit: Payer: Self-pay | Admitting: Cardiovascular Disease

## 2013-08-21 NOTE — Telephone Encounter (Signed)
Ok to try ambien 10 mg, but his current script needs to cover that. Otherwise he will need to self-pay, or go with something his insurance does cover.

## 2013-08-21 NOTE — Telephone Encounter (Signed)
atc na x1 

## 2013-08-21 NOTE — Telephone Encounter (Signed)
I spoke with pt spouse and she states the pt [name] is not covered by insurance and ask that we call [facility] to do authorization. I called # given and initiated PA for ambien 5 mg. Medication approved through 07-11-14. Pt spouse advised. She then mentions that the pt states the 5mg  does not work well for him and that he feels he needs something stronger. He is still not sleeping with the 5 mg. Dr. Annamaria Boots please advise. Since the 5mg  has been approved can the pt try and take 2 to see if this helps before another PA is done? Please advise. Talmage Bing, CMA Allergies  Allergen Reactions  . Penicillins Hives and Rash

## 2013-08-21 NOTE — Telephone Encounter (Signed)
Pt's wife is aware of CY recs.

## 2013-08-23 ENCOUNTER — Ambulatory Visit (HOSPITAL_BASED_OUTPATIENT_CLINIC_OR_DEPARTMENT_OTHER): Payer: Medicare HMO

## 2013-08-23 ENCOUNTER — Other Ambulatory Visit (HOSPITAL_BASED_OUTPATIENT_CLINIC_OR_DEPARTMENT_OTHER): Payer: Medicare HMO

## 2013-08-23 VITALS — BP 120/72 | HR 84 | Temp 97.6°F | Resp 18

## 2013-08-23 DIAGNOSIS — C9000 Multiple myeloma not having achieved remission: Secondary | ICD-10-CM

## 2013-08-23 DIAGNOSIS — Z5112 Encounter for antineoplastic immunotherapy: Secondary | ICD-10-CM

## 2013-08-23 LAB — COMPREHENSIVE METABOLIC PANEL (CC13)
ALK PHOS: 55 U/L (ref 40–150)
ALT: 11 U/L (ref 0–55)
AST: 22 U/L (ref 5–34)
Albumin: 3.3 g/dL — ABNORMAL LOW (ref 3.5–5.0)
Anion Gap: 11 mEq/L (ref 3–11)
BILIRUBIN TOTAL: 0.49 mg/dL (ref 0.20–1.20)
BUN: 10.9 mg/dL (ref 7.0–26.0)
CO2: 25 mEq/L (ref 22–29)
Calcium: 9.5 mg/dL (ref 8.4–10.4)
Chloride: 103 mEq/L (ref 98–109)
Creatinine: 0.9 mg/dL (ref 0.7–1.3)
GLUCOSE: 90 mg/dL (ref 70–140)
Potassium: 4 mEq/L (ref 3.5–5.1)
SODIUM: 139 meq/L (ref 136–145)
Total Protein: 6.5 g/dL (ref 6.4–8.3)

## 2013-08-23 LAB — CBC WITH DIFFERENTIAL/PLATELET
BASO%: 0.5 % (ref 0.0–2.0)
Basophils Absolute: 0 10*3/uL (ref 0.0–0.1)
EOS%: 2.3 % (ref 0.0–7.0)
Eosinophils Absolute: 0.1 10*3/uL (ref 0.0–0.5)
HCT: 37.1 % — ABNORMAL LOW (ref 38.4–49.9)
HGB: 12.6 g/dL — ABNORMAL LOW (ref 13.0–17.1)
LYMPH%: 16.9 % (ref 14.0–49.0)
MCH: 29.6 pg (ref 27.2–33.4)
MCHC: 34 g/dL (ref 32.0–36.0)
MCV: 87.1 fL (ref 79.3–98.0)
MONO#: 1.3 10*3/uL — ABNORMAL HIGH (ref 0.1–0.9)
MONO%: 22.4 % — AB (ref 0.0–14.0)
NEUT#: 3.3 10*3/uL (ref 1.5–6.5)
NEUT%: 57.9 % (ref 39.0–75.0)
Platelets: 122 10*3/uL — ABNORMAL LOW (ref 140–400)
RBC: 4.26 10*6/uL (ref 4.20–5.82)
RDW: 16.7 % — ABNORMAL HIGH (ref 11.0–14.6)
WBC: 5.6 10*3/uL (ref 4.0–10.3)
lymph#: 1 10*3/uL (ref 0.9–3.3)

## 2013-08-23 MED ORDER — BORTEZOMIB CHEMO SQ INJECTION 3.5 MG (2.5MG/ML)
1.3000 mg/m2 | Freq: Once | INTRAMUSCULAR | Status: AC
  Administered 2013-08-23: 3 mg via SUBCUTANEOUS
  Filled 2013-08-23: qty 3

## 2013-08-23 MED ORDER — ONDANSETRON HCL 8 MG PO TABS
8.0000 mg | ORAL_TABLET | Freq: Once | ORAL | Status: AC
  Administered 2013-08-23: 8 mg via ORAL

## 2013-08-23 MED ORDER — ONDANSETRON HCL 8 MG PO TABS
ORAL_TABLET | ORAL | Status: AC
  Filled 2013-08-23: qty 1

## 2013-08-23 MED ORDER — DEXAMETHASONE 4 MG PO TABS
ORAL_TABLET | ORAL | Status: AC
  Filled 2013-08-23: qty 5

## 2013-08-23 MED ORDER — DEXAMETHASONE 4 MG PO TABS
20.0000 mg | ORAL_TABLET | Freq: Once | ORAL | Status: AC
  Administered 2013-08-23: 20 mg via ORAL

## 2013-08-23 NOTE — Patient Instructions (Signed)
[facility] Discharge Instructions for Patients Receiving Chemotherapy  Today you received the following chemotherapy agents: Velcade. To help prevent nausea and vomiting after your treatment, we encourage you to take your nausea medication.  If you develop nausea and vomiting that is not controlled by your nausea medication, call the clinic.   BELOW ARE SYMPTOMS THAT SHOULD BE REPORTED IMMEDIATELY:  *FEVER GREATER THAN 100.5 F  *CHILLS WITH OR WITHOUT FEVER  NAUSEA AND VOMITING THAT IS NOT CONTROLLED WITH YOUR NAUSEA MEDICATION  *UNUSUAL SHORTNESS OF BREATH  *UNUSUAL BRUISING OR BLEEDING  TENDERNESS IN MOUTH AND THROAT WITH OR WITHOUT PRESENCE OF ULCERS  *URINARY PROBLEMS  *BOWEL PROBLEMS  UNUSUAL RASH Items with * indicate a potential emergency and should be followed up as soon as possible.  Feel free to call the clinic you have any questions or concerns. The clinic phone number is [phone].    

## 2013-08-24 ENCOUNTER — Encounter: Payer: Self-pay | Admitting: *Deleted

## 2013-08-27 LAB — PROTEIN ELECTROPHORESIS, SERUM
ALBUMIN ELP: 54.7 % — AB (ref 55.8–66.1)
ALPHA-1-GLOBULIN: 5.4 % — AB (ref 2.9–4.9)
ALPHA-2-GLOBULIN: 14.4 % — AB (ref 7.1–11.8)
BETA GLOBULIN: 5.6 % (ref 4.7–7.2)
Beta 2: 4.3 % (ref 3.2–6.5)
Gamma Globulin: 15.6 % (ref 11.1–18.8)
M-Spike, %: 0.45 g/dL
TOTAL PROTEIN, SERUM ELECTROPHOR: 6.1 g/dL (ref 6.0–8.3)

## 2013-08-27 LAB — IGG: IgG (Immunoglobin G), Serum: 909 mg/dL (ref 650–1600)

## 2013-08-28 ENCOUNTER — Other Ambulatory Visit: Payer: Self-pay | Admitting: Family Medicine

## 2013-08-31 ENCOUNTER — Other Ambulatory Visit: Payer: Self-pay | Admitting: Family Medicine

## 2013-09-02 ENCOUNTER — Other Ambulatory Visit: Payer: Self-pay | Admitting: Oncology

## 2013-09-03 ENCOUNTER — Ambulatory Visit (INDEPENDENT_AMBULATORY_CARE_PROVIDER_SITE_OTHER): Payer: Medicare HMO | Admitting: Family Medicine

## 2013-09-03 ENCOUNTER — Ambulatory Visit (INDEPENDENT_AMBULATORY_CARE_PROVIDER_SITE_OTHER): Payer: Medicare HMO | Admitting: Internal Medicine

## 2013-09-03 ENCOUNTER — Encounter: Payer: Self-pay | Admitting: Family Medicine

## 2013-09-03 VITALS — BP 116/74 | HR 83

## 2013-09-03 VITALS — BP 136/76 | HR 83 | Temp 97.8°F | Ht 72.0 in | Wt 257.2 lb

## 2013-09-03 DIAGNOSIS — I509 Heart failure, unspecified: Secondary | ICD-10-CM

## 2013-09-03 DIAGNOSIS — G4733 Obstructive sleep apnea (adult) (pediatric): Secondary | ICD-10-CM

## 2013-09-03 DIAGNOSIS — K625 Hemorrhage of anus and rectum: Secondary | ICD-10-CM

## 2013-09-03 DIAGNOSIS — J309 Allergic rhinitis, unspecified: Secondary | ICD-10-CM

## 2013-09-03 DIAGNOSIS — J302 Other seasonal allergic rhinitis: Secondary | ICD-10-CM

## 2013-09-03 DIAGNOSIS — E782 Mixed hyperlipidemia: Secondary | ICD-10-CM

## 2013-09-03 DIAGNOSIS — G4700 Insomnia, unspecified: Secondary | ICD-10-CM

## 2013-09-03 DIAGNOSIS — N289 Disorder of kidney and ureter, unspecified: Secondary | ICD-10-CM

## 2013-09-03 DIAGNOSIS — I5030 Unspecified diastolic (congestive) heart failure: Secondary | ICD-10-CM

## 2013-09-03 DIAGNOSIS — G4730 Sleep apnea, unspecified: Secondary | ICD-10-CM

## 2013-09-03 DIAGNOSIS — C9000 Multiple myeloma not having achieved remission: Secondary | ICD-10-CM

## 2013-09-03 DIAGNOSIS — J189 Pneumonia, unspecified organism: Secondary | ICD-10-CM

## 2013-09-03 DIAGNOSIS — R0902 Hypoxemia: Secondary | ICD-10-CM

## 2013-09-03 DIAGNOSIS — I10 Essential (primary) hypertension: Secondary | ICD-10-CM

## 2013-09-03 LAB — POCT CBC
Granulocyte percent: 71.4 %G (ref 37–80)
HCT, POC: 38.4 % — AB (ref 43.5–53.7)
Hemoglobin: 12.3 g/dL — AB (ref 14.1–18.1)
Lymph, poc: 1.4 (ref 0.6–3.4)
MCH, POC: 28.2 pg (ref 27–31.2)
MCHC: 32.1 g/dL (ref 31.8–35.4)
MCV: 87.8 fL (ref 80–97)
MPV: 8.4 fL (ref 0–99.8)
POC Granulocyte: 4.4 (ref 2–6.9)
POC LYMPH PERCENT: 23.4 %L (ref 10–50)
Platelet Count, POC: 126 10*3/uL — AB (ref 142–424)
RBC: 4.4 M/uL — AB (ref 4.69–6.13)
RDW, POC: 16.8 %
WBC: 6.1 10*3/uL (ref 4.6–10.2)

## 2013-09-03 NOTE — Patient Instructions (Signed)
Order- [facility] reduce CPAP to 9 cwp and also teach adjustment of his humidifier      Dx OSA

## 2013-09-03 NOTE — Progress Notes (Signed)
Patient ID: Torris House, male    DOB: 1936/02/16, 78 y.o.   MRN: [id number]  HPI 03/05/11- 78 year old male former smoker seen because of obstructive sleep apnea on kind referral by Dr. Jacelyn Grip from [facility] in [place]. Wife(?) is here. A diagnostic sleep study on 01/31/2008 was done at [facility] because of insomnia with sleep apnea. This study confirmed moderately severe obstructive sleep apnea with an AHI of 22 per hour. A full face mask became uncomfortable, he got tired of it and stopped using CPAP several months to a year ago. Wife now reports loud snoring and again says he stays sleepy during the day although he has difficulty falling asleep. Bedtime between 8 and 9 PM with sleep latency at least to 30 minutes. He feels that he awakens and will lie awake or get up and wander around in the home  half of each night. He finally gets up around 5 AM. When he is asleep he feels that he is sleeping well but this is quite fragmented. He admits drowsiness during the day if he sits quietly. Occasional cough in the morning. Feels smothered lying supine, better on his sides. Treated for hypertension and elevated cholesterol but he denies heart or lung disease. Denies ENT surgery.  He had smoked heavily but quit 20 years ago.  04/23/11-  78 year old male former smoker seen because of obstructive sleep apnea. A male companion is with him. He has not been able to be compliant with CPAP, blaming pains in the back of his neck and spine related to wearing the CPAP. We discussed how it might be constraining motion until his muscles gets stiff. He wore it for one week out of the two-week auto titration trial. He has an older machine that worked well. He says his mask leak so that was unusable. His home care company would not replace it because he old money. He wants to skip the auto titration, go back to his old machine, change equipment supplier and get a new mask. We discussed CPAP, indications and alternatives,  medical concerns, and the documentation of adequate compliance to meet the Medicare rules. He isn't sure of the pressure setting on his old machine, which was one of the reasons we went for auto titration.  06/04/11- 78 year old male former smoker seen because of obstructive sleep apnea. Wife here. He got a replacement CPAP mask but is using his old machine. Advanced changed his pressure to 10. The current mask does not leak. He complains of insomnia with or without CPAP and that seems to be the biggest problem interfering with a sustained use of CPAP now.  07/30/11- 78 year old male former smoker seen because of obstructive sleep apnea. Wife here He try his old CPAP machine again but complains that humidifier gurgles in the flows even when he tries putting it lower than the level of his head so water runs back into the machine. He tried without she notify her but didn't like that either. Wife doesn't think CPAP stopped his snoring. He asks for a new sleep study which is the documentation we really need. He is sometimes restless at night even if he takes a sleeping pill.  09/02/19- 78 year old male former smoker seen because of obstructive sleep apnea.     NPSG 08/22/11- moderate OSA, AHI 26.8/hr with CPAP titration to 9 cwp for AHI 0.5/ hr. Oxygen was added at 2 L/M due to desaturation, and will need follow-up.  11/29/11- 78 year old male former smoker seen because of obstructive sleep apnea.  Pt hasnt worn mask fo 1 month. pt denies any sob,wheezing, chest congestion He was hospitalized twice for back surgery and I think complication of that surgery. Through that, he dropped off of CPAP. Wife says he still uses it a few hours at a time and it works when he wears it. Control has seemed  good at 9 CWP/ Advanced. Sleep is also disturbed by pain in his knees  02/29/12- 78 year old male former smoker seen because of obstructive sleep apnea.        Wife here  CPAP mask is not working well-unable to get one  until September due to insurance; Trying to wear CPAP every night  for 8-9 hours, but current mask hurts his face.  08/31/12- 78 year old male former smoker seen because of obstructive sleep apnea. FOLLOWS FOR: trying to wear CPAP every night but has hard time with mask around nose and pressure blowing into eyes; also states hard to lay/sleep on back. Leak worse in his preferred R decubitus sleep position. Wife says he snores through at times, but much better with CPAP. He is also concerned about dyspnea, especially with exertion. Dr Benay Spice recently suggested he might need to see a heart doctor. Denies chest pain, cough or wheeze. Apparently not a new problem.  03/05/13- 78 year old male former smoker seen because of obstructive sleep apnea. FOLLOWS FOR: hard to pull air in when putting machine on; then feels like too much air through the night; Wears CPAP 9/ [facility] every night. Would like refill of sleep medication as well.  Ambien has worked well with no problems. We discussed this again. CXR 3//3/14 IMPRESSION:  Negative chest.  Original Report Authenticated By: Orlean Patten, M.D.  07/13/13- 78 year old male former smoker seen because of obstructive sleep apnea. FOLLOWS FOR: pt had PFT done 06-2013 and Dr Bronson Ing wanted pt to be seen due to abnormal PFT results. CPAP Auto 7-12 Advanced.usually okay but at times he has trouble sleeping with it. Download indicates pressure of 10 would work. Now has an incidental cold. Routinely easy dyspnea on exertion without cough or wheeze. Ankles have been swelling. Says cardiac workup negative. ABG on room air 06/15/2013-pH 7.44, PCO2 35.7, PO2 75.7, bicarbonate 24.1. Slight respiratory alkalosis. PFT 06/15/2013-mild obstructive airways disease with insignificant response to bronchodilator, slight air trapping, diffusion severely reduced. FEC 2.88/71%, FEV1 2.28/76%, FEV1/FVC 0.79/105%, FEF 25-75% 1.63/66%. RV/TLC 128%, DLCO 47%. CXR  04/09/13 IMPRESSION:  Minimal bibasilar atelectasis or scarring.  Electronically Signed  By: Lorin Picket M.D.  On: 04/09/2013 16:49  09/03/13- 78 year old male former smoker seen because of obstructive sleep apnea. FOLLOWS FOR: Wearing CPAP 10/ Advanced for about 8-10 hours/night. Feels pressure is too much. Pt states CPAP is giving him a cold and headache. DME is [facility]. Using nasal pillows mask. He finds using this "challenging". Denies nocturnal choking or choking with meals. We discussed how to use the humidifier attachment. CT chest 08/07/13 IMPRESSION:  No pulmonary embolism.  Lower lobe peribronchial thickening and debris within right greater  than left lower lobe bronchi. May reflect bronchitis or aspiration  pneumonitis.  4 cm left hepatic lobe lesion is favored to reflect a hemangioma.  There may be a smaller right hepatic lobe hemangioma versus blush of  variant perfusion.  Electronically Signed  By: Carlos Levering M.D.  On: 08/07/2013 00:50   Review of Systems-see HPI Constitutional:   No-   weight loss, night sweats, fevers, chills, +fatigue, lassitude HEENT:   No-  headaches, difficulty swallowing, tooth/dental problems, sore throat,  No-  sneezing, itching, ear ache, +nasal congestion, post nasal drip,  CV:  No-   chest pain, orthopnea, PND, swelling in lower extremities, anasarca, dizziness, palpitations Resp: + shortness of breath with exertion or at rest.              No-   productive cough,  No non-productive cough,  No-  coughing up of blood.              No-   change in color of mucus.  No- wheezing.   Skin: No-   rash or lesions. GI:  No-   heartburn, indigestion, abdominal pain, nausea, vomiting,  GU: MS:  No-   joint pain or swelling.   Neuro- normal:  Psych:  No- change in mood or affect. No depression or anxiety.  No memory loss.  Objective:   Physical Exam General- Alert, Oriented, Affect-appropriate, Distress- none acute,  Overweight,  calm/laconic but seems intelligent. Yawning Using a walker. Skin- rash-none, lesions- none, excoriation- none Lymphadenopathy- none Head- atraumatic            Eyes- Gross vision intact, PERRLA, conjunctivae clear secretions, strabismus            Ears- Hearing, canals-normal            Nose- Clear, no-Septal dev, mucus, polyps, erosion, perforation             Throat- Mallampati II-III , mucosa clear , drainage- none, tonsils- atrophic Neck- flexible , trachea midline, no stridor , thyroid nl, carotid no bruit Chest - symmetrical excursion , unlabored           Heart/CV- RRR , no murmur , no gallop  , no rub, nl s1 s2                           +JVD 1 cm , edema+trace, stasis changes- none, varices- none           Lung-  clear, wheeze- none, cough- none , dullness-none, rub- none           Chest wall-  Abd- Br/ Gen/ Rectal- Not done, not indicated Extrem- cyanosis- none, clubbing, none, atrophy- none, strength- nl Neuro- grossly intact to observation

## 2013-09-03 NOTE — Patient Instructions (Signed)
Use preparation H over the counter.   Hemorrhoids Hemorrhoids are swollen veins around the rectum or anus. There are two types of hemorrhoids:   Internal hemorrhoids. These occur in the veins just inside the rectum. They may poke through to the outside and become irritated and painful.  External hemorrhoids. These occur in the veins outside the anus and can be felt as a painful swelling or hard lump near the anus. CAUSES  Pregnancy.   Obesity.   Constipation or diarrhea.   Straining to have a bowel movement.   Sitting for long periods on the toilet.  Heavy lifting or other activity that caused you to strain.  Anal intercourse. SYMPTOMS   Pain.   Anal itching or irritation.   Rectal bleeding.   Fecal leakage.   Anal swelling.   One or more lumps around the anus.  DIAGNOSIS  Your caregiver may be able to diagnose hemorrhoids by visual examination. Other examinations or tests that may be performed include:   Examination of the rectal area with a gloved hand (digital rectal exam).   Examination of anal canal using a small tube (scope).   A blood test if you have lost a significant amount of blood.  A test to look inside the colon (sigmoidoscopy or colonoscopy). TREATMENT Most hemorrhoids can be treated at home. However, if symptoms do not seem to be getting better or if you have a lot of rectal bleeding, your caregiver may perform a procedure to help make the hemorrhoids get smaller or remove them completely. Possible treatments include:   Placing a rubber band at the base of the hemorrhoid to cut off the circulation (rubber band ligation).   Injecting a chemical to shrink the hemorrhoid (sclerotherapy).   Using a tool to burn the hemorrhoid (infrared light therapy).   Surgically removing the hemorrhoid (hemorrhoidectomy).   Stapling the hemorrhoid to block blood flow to the tissue (hemorrhoid stapling).  HOME CARE INSTRUCTIONS   Eat foods  with fiber, such as whole grains, beans, nuts, fruits, and vegetables. Ask your doctor about taking products with added fiber in them (fibersupplements).  Increase fluid intake. Drink enough water and fluids to keep your urine clear or pale yellow.   Exercise regularly.   Go to the bathroom when you have the urge to have a bowel movement. Do not wait.   Avoid straining to have bowel movements.   Keep the anal area dry and clean. Use wet toilet paper or moist towelettes after a bowel movement.   Medicated creams and suppositories may be used or applied as directed.   Only take over-the-counter or prescription medicines as directed by your caregiver.   Take warm sitz baths for 15 20 minutes, 3 4 times a day to ease pain and discomfort.   Place ice packs on the hemorrhoids if they are tender and swollen. Using ice packs between sitz baths may be helpful.   Put ice in a plastic bag.   Place a towel between your skin and the bag.   Leave the ice on for 15 20 minutes, 3 4 times a day.   Do not use a donut-shaped pillow or sit on the toilet for long periods. This increases blood pooling and pain.  SEEK MEDICAL CARE IF:  You have increasing pain and swelling that is not controlled by treatment or medicine.  You have uncontrolled bleeding.  You have difficulty or you are unable to have a bowel movement.  You have pain or inflammation outside  the area of the hemorrhoids. MAKE SURE YOU:  Understand these instructions.  Will watch your condition.  Will get help right away if you are not doing well or get worse. Document Released: 06/25/2000 Document Revised: 06/14/2012 Document Reviewed: 05/02/2012 [facility] Patient Information 2014 [place].

## 2013-09-03 NOTE — Progress Notes (Signed)
Patient ID: [name], male   DOB: 27-Jan-1936, 78 y.o.   MRN: [id number] SUBJECTIVE: CC: Chief Complaint  Patient presents with  . Follow-up    2 week follow up pneumonia .coughing very little.  states saw bright red blood rectally sunday     HPI: Breathing is good now. On the portable oxygen.  however got constipated and had bleeding from his rectum. He has had a colonoscopy with Dr Carlean Purl and had hemorrhoid banding and treatment with suppositories.   Past Medical History  Diagnosis Date  . GERD (gastroesophageal reflux disease)   . Essential hypertension, benign   . BPH (benign prostatic hyperplasia)   . Lumbar herniated disc     L4-5  . Hyperlipidemia   . Sleep apnea   . Multiple myeloma 02/19/2008  . Arthritis   . Diverticulosis   . Peptic ulcer disease   . Tubular adenoma 02/16/2008    Dr. Silvano Rusk   Past Surgical History  Procedure Laterality Date  . Neck surgery    . Lumbar laminectomy/decompression microdiscectomy  11/01/2011    Procedure: LUMBAR LAMINECTOMY/DECOMPRESSION MICRODISCECTOMY 2 LEVELS;  Surgeon: Charlie Pitter, MD;  Location: [facility];  Service: Neurosurgery;  Laterality: Right;  Lumbar Laminectomy/Microdiscectomy Decompression Lumbar Three-Four, Lumbar Four-Five   . Colonoscopy    . Hemorrhoid banding  2014   History   Social History  . Marital Status: Married    Spouse Name: N/A    Number of Children: 4  . Years of Education: N/A   Occupational History  . retired-disabled-Construction    Social History Main Topics  . Smoking status: Former Smoker -- 2.00 packs/day for 20 years    Types: Cigarettes    Quit date: 07/12/1990  . Smokeless tobacco: Never Used  . Alcohol Use: No  . Drug Use: No  . Sexual Activity: No   Other Topics Concern  . Not on file   Social History Narrative   Pt was adopted.   Married   Programmer, applications   Family History  Problem Relation Age of Onset  . Adopted: Yes   Current Outpatient Prescriptions on  File Prior to Visit  Medication Sig Dispense Refill  . acyclovir (ZOVIRAX) 400 MG tablet Take 400 mg by mouth 2 (two) times daily.      Marland Kitchen albuterol (PROVENTIL HFA) 108 (90 BASE) MCG/ACT inhaler Inhale 2 puffs into the lungs every 6 (six) hours as needed for wheezing or shortness of breath.  1 Inhaler  2  . amLODipine (NORVASC) 2.5 MG tablet TAKE 1 TABLET (2.5 MG TOTAL) BY MOUTH DAILY.  30 tablet  5  . aspirin 325 MG EC tablet Take 1 tablet (325 mg total) by mouth 2 (two) times daily. Do not take again until March 20, 2013      . atorvastatin (LIPITOR) 20 MG tablet TAKE 1 TABLET (20 MG TOTAL) BY MOUTH DAILY.  30 tablet  5  . carvedilol (COREG) 3.125 MG tablet Take 1 tablet (3.125 mg total) by mouth 2 (two) times daily with a meal.  60 tablet  0  . Cyclophosphamide 50 MG CAPS Take every Thursday      . dexamethasone (DECADRON) 4 MG tablet Take 20 mg by mouth. Take 5 tablets on Thursdays when you are not getting chemotherapy.      . fluticasone (FLONASE) 50 MCG/ACT nasal spray Place 2 sprays into both nostrils daily.  16 g  6  . lactulose (CHRONULAC) 10 GM/15ML solution Take 10 g by mouth  2 (two) times daily as needed.      . metFORMIN (GLUCOPHAGE) 500 MG tablet Take 1 tablet (500 mg total) by mouth 2 (two) times daily with a meal.  60 tablet  0  . NITROSTAT 0.4 MG SL tablet Place 0.4 mg under the tongue as needed for chest pain.       . pantoprazole (PROTONIX) 40 MG tablet Take 1 tablet (40 mg total) by mouth daily.  30 tablet  3  . polyethylene glycol (MIRALAX / GLYCOLAX) packet Take 17 g by mouth 2 (two) times daily.       . potassium chloride SA (KLOR-CON M20) 20 MEQ tablet Take 1 tablet (20 mEq total) by mouth daily.  30 tablet  2  . PRESCRIPTION MEDICATION Inject as directed every 7 (seven) days. Velcade $RemoveBefor'3mg'[id number]$  injection q7d on Thursdays, skips every 4th week.      . prochlorperazine (COMPAZINE) 10 MG tablet Take 10 mg by mouth every 6 (six) hours as needed (for nausea).      . tamsulosin  (FLOMAX) 0.4 MG CAPS capsule Take 0.4 mg by mouth 2 (two) times daily. With a  Meal.      . zolpidem (AMBIEN) 5 MG tablet Take 5 mg by mouth at bedtime as needed for sleep.        No current facility-administered medications on file prior to visit.   Allergies  Allergen Reactions  . Penicillins Hives and Rash   Immunization History  Administered Date(s) Administered  . Influenza Split 04/23/2011, 04/11/2012  . Influenza,inj,Quad PF,36+ Mos 04/09/2013  . Tdap 07/13/2007   Prior to Admission medications   Medication Sig Start Date End Date Taking? Authorizing Provider  acyclovir (ZOVIRAX) 400 MG tablet Take 400 mg by mouth 2 (two) times daily.   Yes Historical Provider, MD  albuterol (PROVENTIL HFA) 108 (90 BASE) MCG/ACT inhaler Inhale 2 puffs into the lungs every 6 (six) hours as needed for wheezing or shortness of breath. 08/09/13  Yes Allie Bossier, MD  amLODipine (NORVASC) 2.5 MG tablet TAKE 1 TABLET (2.5 MG TOTAL) BY MOUTH DAILY.   Yes Vernie Shanks, MD  aspirin 325 MG EC tablet Take 1 tablet (325 mg total) by mouth 2 (two) times daily. Do not take again until March 20, 2013 03/06/13  Yes Gatha Mayer, MD  atorvastatin (LIPITOR) 20 MG tablet TAKE 1 TABLET (20 MG TOTAL) BY MOUTH DAILY. 08/20/13  Yes Vernie Shanks, MD  carvedilol (COREG) 3.125 MG tablet Take 1 tablet (3.125 mg total) by mouth 2 (two) times daily with a meal. 08/09/13  Yes Allie Bossier, MD  Cyclophosphamide 50 MG CAPS  07/25/13  Yes Historical Provider, MD  dexamethasone (DECADRON) 4 MG tablet Take 20 mg by mouth. Take 5 tablets on Thursdays when you are not getting chemotherapy.   Yes Historical Provider, MD  fluticasone (FLONASE) 50 MCG/ACT nasal spray Place 2 sprays into both nostrils daily. 06/11/13  Yes Vernie Shanks, MD  lactulose (CHRONULAC) 10 GM/15ML solution Take 10 g by mouth 2 (two) times daily as needed. 07/31/13  Yes Owens Shark, NP  metFORMIN (GLUCOPHAGE) 500 MG tablet Take 1 tablet (500 mg total) by  mouth 2 (two) times daily with a meal. 08/09/13  Yes Allie Bossier, MD  NITROSTAT 0.4 MG SL tablet Place 0.4 mg under the tongue as needed for chest pain.  12/07/12  Yes Historical Provider, MD  pantoprazole (PROTONIX) 40 MG tablet Take 1 tablet (40 mg total) by  mouth daily. 08/14/13  Yes Vernie Shanks, MD  polyethylene glycol (MIRALAX / GLYCOLAX) packet Take 17 g by mouth 2 (two) times daily.    Yes Historical Provider, MD  potassium chloride SA (KLOR-CON M20) 20 MEQ tablet Take 1 tablet (20 mEq total) by mouth daily. 06/26/13  Yes Vernie Shanks, MD  PRESCRIPTION MEDICATION Inject as directed every 7 (seven) days. Velcade $RemoveBefor'3mg'[id number]$  injection q7d on Thursdays, skips every 4th week.   Yes Historical Provider, MD  prochlorperazine (COMPAZINE) 10 MG tablet Take 10 mg by mouth every 6 (six) hours as needed (for nausea).   Yes Historical Provider, MD  tamsulosin (FLOMAX) 0.4 MG CAPS capsule Take 0.4 mg by mouth 2 (two) times daily. With a  Meal.   Yes Historical Provider, MD  zolpidem (AMBIEN) 5 MG tablet Take 5 mg by mouth at bedtime as needed for sleep.  04/05/13  Yes Historical Provider, MD  levofloxacin (LEVAQUIN) 750 MG tablet Take 1 tablet (750 mg total) by mouth daily. 08/09/13   Allie Bossier, MD     ROS: As above in the HPI. All other systems are stable or negative.  OBJECTIVE: APPEARANCE:  Patient in no acute distress.The patient appeared well nourished and normally developed. Acyanotic. Waist: VITAL SIGNS:BP 136/76  Pulse 83  Temp(Src) 97.8 F (36.6 C) (Oral)  Ht 6' (1.829 m)  Wt 257 lb 3.2 oz (116.665 kg)  BMI 34.87 kg/m2  SpO2 98% AAM Mm pink  SKIN: warm and  Dry without overt rashes, tattoos and scars  HEAD and Neck: without JVD, Head and scalp: normal Eyes:No scleral icterus. Fundi normal, eye movements normal. Ears: Auricle normal, canal normal, Tympanic membranes normal, insufflation normal. Nose: normal Throat: normal Neck & thyroid: normal  CHEST & LUNGS: Chest wall:  normal Lungs: Clear on portable O2.  CVS: Reveals the PMI to be normally located. Regular rhythm, First and Second Heart sounds are normal,  absence of murmurs, rubs or gallops. Peripheral vasculature: Radial pulses: normal Dorsal pedis pulses: normal Posterior pulses: normal  ABDOMEN:  Appearance: Obese Benign, no organomegaly, no masses, no Abdominal Aortic enlargement. No Guarding , no rebound. No Bruits. Bowel sounds: normal  RECTAL:  Partial rectal mucosal prolapse. With hemorrhoid on the rigght side of the rectume. Trace heme positive. No rectal masses except for the prolapse. Prostate moderately enlarged.  GU: N/A  EXTREMETIES: nonedematous.  NEUROLOGIC: oriented to time,place and person; no changes   ASSESSMENT: Pneumonia  Multiple myeloma  Essential hypertension, benign  Diastolic CHF  Seasonal allergic rhinitis  Renal insufficiency  Obstructive sleep apnea  Mixed hyperlipidemia  Insomnia with sleep apnea  Rectal bleeding - Plan: POCT CBC, Ambulatory referral to Gastroenterology  Pneumonia resolved. New recurrence of rectal bleed.  PLAN: Use OTC preparation H. Use the stool softeners he was Rx by GI and Oncology.  Needs to followup with GI   Orders Placed This Encounter  Procedures  . Ambulatory referral to Gastroenterology    Referral Priority:  Routine    Referral Type:  Consultation    Referral Reason:  Specialty Services Required    Requested Specialty:  Gastroenterology    Number of Visits Requested:  1  . POCT CBC   No orders of the defined types were placed in this encounter.   Medications Discontinued During This Encounter  Medication Reason  . atorvastatin (LIPITOR) 20 MG tablet Duplicate   Return in about 2 weeks (around 09/17/2013) for Recheck medical problems; recheck hemorrhoids.  Tawn Fitzner P. Jacelyn Grip, M.D.

## 2013-09-04 ENCOUNTER — Other Ambulatory Visit: Payer: Self-pay | Admitting: Family Medicine

## 2013-09-04 ENCOUNTER — Ambulatory Visit: Payer: Medicare HMO | Admitting: Family Medicine

## 2013-09-06 ENCOUNTER — Telehealth: Payer: Self-pay | Admitting: Nurse Practitioner

## 2013-09-06 ENCOUNTER — Other Ambulatory Visit: Payer: Self-pay | Admitting: *Deleted

## 2013-09-06 ENCOUNTER — Ambulatory Visit: Payer: Self-pay

## 2013-09-06 ENCOUNTER — Other Ambulatory Visit: Payer: Self-pay

## 2013-09-06 ENCOUNTER — Ambulatory Visit: Payer: Self-pay | Admitting: Nurse Practitioner

## 2013-09-06 ENCOUNTER — Telehealth: Payer: Self-pay | Admitting: *Deleted

## 2013-09-06 NOTE — Telephone Encounter (Signed)
Patient called and canceled appts for today due to weather. I have rescheduled appts to tomorrow for lab/treatment. Left desk RN message for MD apt.  JMW

## 2013-09-06 NOTE — Telephone Encounter (Signed)
Chart says Protonix , not omeprazole?

## 2013-09-06 NOTE — Telephone Encounter (Signed)
This Rn called patient to verify if he will arrive for treatment. Patient's wife states they are snowed in and will not be able to come in today. This RN encouraged patient's wife to make safe decisions and assured her chemotherapy can be rescheduled. This RN instructed patient's wife to call main clinic number [phone] to reschedule treatment. Patient's wife verbalized understanding.

## 2013-09-06 NOTE — Telephone Encounter (Signed)
Call patient. Refill denied. He was Rx protonix a couple of weeks ago and he has refills. He does not need the prilosec. Bring all medications at next office visit.

## 2013-09-07 ENCOUNTER — Ambulatory Visit (HOSPITAL_BASED_OUTPATIENT_CLINIC_OR_DEPARTMENT_OTHER): Payer: Medicare HMO

## 2013-09-07 ENCOUNTER — Telehealth: Payer: Self-pay | Admitting: *Deleted

## 2013-09-07 ENCOUNTER — Other Ambulatory Visit (HOSPITAL_BASED_OUTPATIENT_CLINIC_OR_DEPARTMENT_OTHER): Payer: Medicare HMO

## 2013-09-07 ENCOUNTER — Other Ambulatory Visit: Payer: Self-pay | Admitting: Family Medicine

## 2013-09-07 ENCOUNTER — Telehealth: Payer: Self-pay | Admitting: Oncology

## 2013-09-07 VITALS — BP 127/70 | HR 84 | Temp 97.5°F | Resp 18

## 2013-09-07 DIAGNOSIS — C9000 Multiple myeloma not having achieved remission: Secondary | ICD-10-CM

## 2013-09-07 DIAGNOSIS — Z5112 Encounter for antineoplastic immunotherapy: Secondary | ICD-10-CM

## 2013-09-07 LAB — CBC WITH DIFFERENTIAL/PLATELET
BASO%: 1.2 % (ref 0.0–2.0)
Basophils Absolute: 0.1 10*3/uL (ref 0.0–0.1)
EOS ABS: 0.1 10*3/uL (ref 0.0–0.5)
EOS%: 2.6 % (ref 0.0–7.0)
HCT: 36.2 % — ABNORMAL LOW (ref 38.4–49.9)
HGB: 12.3 g/dL — ABNORMAL LOW (ref 13.0–17.1)
LYMPH%: 25 % (ref 14.0–49.0)
MCH: 29.1 pg (ref 27.2–33.4)
MCHC: 33.9 g/dL (ref 32.0–36.0)
MCV: 86.1 fL (ref 79.3–98.0)
MONO#: 1 10*3/uL — AB (ref 0.1–0.9)
MONO%: 19.8 % — ABNORMAL HIGH (ref 0.0–14.0)
NEUT%: 51.4 % (ref 39.0–75.0)
NEUTROS ABS: 2.7 10*3/uL (ref 1.5–6.5)
Platelets: 147 10*3/uL (ref 140–400)
RBC: 4.2 10*6/uL (ref 4.20–5.82)
RDW: 16.7 % — ABNORMAL HIGH (ref 11.0–14.6)
WBC: 5.3 10*3/uL (ref 4.0–10.3)
lymph#: 1.3 10*3/uL (ref 0.9–3.3)

## 2013-09-07 MED ORDER — ONDANSETRON HCL 8 MG PO TABS
8.0000 mg | ORAL_TABLET | Freq: Once | ORAL | Status: AC
  Administered 2013-09-07: 8 mg via ORAL

## 2013-09-07 MED ORDER — DEXAMETHASONE 4 MG PO TABS
20.0000 mg | ORAL_TABLET | Freq: Once | ORAL | Status: AC
  Administered 2013-09-07: 20 mg via ORAL

## 2013-09-07 MED ORDER — ONDANSETRON HCL 8 MG PO TABS
ORAL_TABLET | ORAL | Status: AC
  Filled 2013-09-07: qty 1

## 2013-09-07 MED ORDER — BORTEZOMIB CHEMO SQ INJECTION 3.5 MG (2.5MG/ML)
1.3000 mg/m2 | Freq: Once | INTRAMUSCULAR | Status: AC
  Administered 2013-09-07: 3 mg via SUBCUTANEOUS
  Filled 2013-09-07: qty 3

## 2013-09-07 MED ORDER — DEXAMETHASONE 4 MG PO TABS
ORAL_TABLET | ORAL | Status: AC
  Filled 2013-09-07: qty 5

## 2013-09-07 NOTE — Telephone Encounter (Signed)
I have adjusted appts 

## 2013-09-07 NOTE — Patient Instructions (Signed)
[facility] Discharge Instructions for Patients Receiving Chemotherapy  Today you received the following chemotherapy agents: Velcade.  To help prevent nausea and vomiting after your treatment, we encourage you to take your nausea medication as prescribed.   If you develop nausea and vomiting that is not controlled by your nausea medication, call the clinic.   BELOW ARE SYMPTOMS THAT SHOULD BE REPORTED IMMEDIATELY:  *FEVER GREATER THAN 100.5 F  *CHILLS WITH OR WITHOUT FEVER  NAUSEA AND VOMITING THAT IS NOT CONTROLLED WITH YOUR NAUSEA MEDICATION  *UNUSUAL SHORTNESS OF BREATH  *UNUSUAL BRUISING OR BLEEDING  TENDERNESS IN MOUTH AND THROAT WITH OR WITHOUT PRESENCE OF ULCERS  *URINARY PROBLEMS  *BOWEL PROBLEMS  UNUSUAL RASH Items with * indicate a potential emergency and should be followed up as soon as possible.  Feel free to call the clinic you have any questions or concerns. The clinic phone number is [phone].    

## 2013-09-07 NOTE — Telephone Encounter (Signed)
, °

## 2013-09-08 ENCOUNTER — Other Ambulatory Visit: Payer: Self-pay | Admitting: Internal Medicine

## 2013-09-09 ENCOUNTER — Other Ambulatory Visit: Payer: Self-pay | Admitting: Oncology

## 2013-09-11 ENCOUNTER — Telehealth: Payer: Self-pay | Admitting: Internal Medicine

## 2013-09-11 NOTE — Telephone Encounter (Signed)
Spoke with pt's wife. States that he needs an increase in his Ambien strength. He is not sleeping at all with the 5mg .  Last OV 09/03/13 Pending OV 02/03/14 Last fill of Ambien 5mg  04/04/13  CY - please advise in strength change and refill. Thanks.

## 2013-09-12 ENCOUNTER — Other Ambulatory Visit: Payer: Self-pay | Admitting: *Deleted

## 2013-09-12 DIAGNOSIS — C9000 Multiple myeloma not having achieved remission: Secondary | ICD-10-CM

## 2013-09-12 MED ORDER — CLONAZEPAM 0.5 MG PO TABS: ORAL_TABLET | ORAL | Status: DC

## 2013-09-12 NOTE — Telephone Encounter (Signed)
Higher doses of ambien are not recommended at his age.  Instead try clonazepam 0.5 mg, # 30, 1 or 2 taken 1/2 hour before bedtime if needed ref x 1

## 2013-09-12 NOTE — Telephone Encounter (Signed)
Pt's wife is aware of medication change. Rx has been called in. Nothing further is needed.

## 2013-09-13 ENCOUNTER — Telehealth: Payer: Self-pay | Admitting: *Deleted

## 2013-09-13 ENCOUNTER — Ambulatory Visit (HOSPITAL_BASED_OUTPATIENT_CLINIC_OR_DEPARTMENT_OTHER): Payer: Medicare HMO | Admitting: Oncology

## 2013-09-13 ENCOUNTER — Other Ambulatory Visit: Payer: Self-pay | Admitting: *Deleted

## 2013-09-13 ENCOUNTER — Other Ambulatory Visit (HOSPITAL_BASED_OUTPATIENT_CLINIC_OR_DEPARTMENT_OTHER): Payer: Medicare HMO

## 2013-09-13 ENCOUNTER — Ambulatory Visit (HOSPITAL_BASED_OUTPATIENT_CLINIC_OR_DEPARTMENT_OTHER): Payer: Medicare HMO

## 2013-09-13 ENCOUNTER — Telehealth: Payer: Self-pay | Admitting: Oncology

## 2013-09-13 VITALS — BP 156/88 | HR 92 | Temp 98.8°F | Resp 18 | Ht 72.0 in | Wt 258.7 lb

## 2013-09-13 DIAGNOSIS — C9000 Multiple myeloma not having achieved remission: Secondary | ICD-10-CM

## 2013-09-13 DIAGNOSIS — D630 Anemia in neoplastic disease: Secondary | ICD-10-CM

## 2013-09-13 DIAGNOSIS — Z5112 Encounter for antineoplastic immunotherapy: Secondary | ICD-10-CM

## 2013-09-13 DIAGNOSIS — R609 Edema, unspecified: Secondary | ICD-10-CM

## 2013-09-13 DIAGNOSIS — R42 Dizziness and giddiness: Secondary | ICD-10-CM

## 2013-09-13 DIAGNOSIS — Z86718 Personal history of other venous thrombosis and embolism: Secondary | ICD-10-CM

## 2013-09-13 LAB — CBC WITH DIFFERENTIAL/PLATELET
BASO%: 0.4 % (ref 0.0–2.0)
Basophils Absolute: 0 10*3/uL (ref 0.0–0.1)
EOS ABS: 0.1 10*3/uL (ref 0.0–0.5)
EOS%: 2 % (ref 0.0–7.0)
HEMATOCRIT: 34.9 % — AB (ref 38.4–49.9)
HGB: 11.8 g/dL — ABNORMAL LOW (ref 13.0–17.1)
LYMPH%: 17.7 % (ref 14.0–49.0)
MCH: 29.2 pg (ref 27.2–33.4)
MCHC: 33.9 g/dL (ref 32.0–36.0)
MCV: 86.2 fL (ref 79.3–98.0)
MONO#: 0.8 10*3/uL (ref 0.1–0.9)
MONO%: 15.4 % — ABNORMAL HIGH (ref 0.0–14.0)
NEUT#: 3.4 10*3/uL (ref 1.5–6.5)
NEUT%: 64.5 % (ref 39.0–75.0)
PLATELETS: 128 10*3/uL — AB (ref 140–400)
RBC: 4.04 10*6/uL — AB (ref 4.20–5.82)
RDW: 16 % — ABNORMAL HIGH (ref 11.0–14.6)
WBC: 5.2 10*3/uL (ref 4.0–10.3)
lymph#: 0.9 10*3/uL (ref 0.9–3.3)

## 2013-09-13 MED ORDER — ONDANSETRON HCL 8 MG PO TABS
8.0000 mg | ORAL_TABLET | Freq: Once | ORAL | Status: AC
  Administered 2013-09-13: 8 mg via ORAL

## 2013-09-13 MED ORDER — BORTEZOMIB CHEMO SQ INJECTION 3.5 MG (2.5MG/ML)
1.3000 mg/m2 | Freq: Once | INTRAMUSCULAR | Status: AC
  Administered 2013-09-13: 3 mg via SUBCUTANEOUS
  Filled 2013-09-13: qty 3

## 2013-09-13 MED ORDER — DEXAMETHASONE 4 MG PO TABS
20.0000 mg | ORAL_TABLET | Freq: Once | ORAL | Status: AC
  Administered 2013-09-13: 20 mg via ORAL

## 2013-09-13 MED ORDER — ONDANSETRON HCL 8 MG PO TABS
ORAL_TABLET | ORAL | Status: AC
Start: 2013-09-13 — End: 2013-09-13
  Filled 2013-09-13: qty 1

## 2013-09-13 MED ORDER — DEXAMETHASONE 4 MG PO TABS
ORAL_TABLET | ORAL | Status: AC
  Filled 2013-09-13: qty 5

## 2013-09-13 NOTE — Patient Instructions (Signed)
[facility] Discharge Instructions for Patients Receiving Chemotherapy  Today you received the following chemotherapy agents velcade   To help prevent nausea and vomiting after your treatment, we encourage you to take your nausea medication as directed  If you develop nausea and vomiting that is not controlled by your nausea medication, call the clinic.   BELOW ARE SYMPTOMS THAT SHOULD BE REPORTED IMMEDIATELY:  *FEVER GREATER THAN 100.5 F  *CHILLS WITH OR WITHOUT FEVER  NAUSEA AND VOMITING THAT IS NOT CONTROLLED WITH YOUR NAUSEA MEDICATION  *UNUSUAL SHORTNESS OF BREATH  *UNUSUAL BRUISING OR BLEEDING  TENDERNESS IN MOUTH AND THROAT WITH OR WITHOUT PRESENCE OF ULCERS  *URINARY PROBLEMS  *BOWEL PROBLEMS  UNUSUAL RASH Items with * indicate a potential emergency and should be followed up as soon as possible.  Feel free to call the clinic you have any questions or concerns. The clinic phone number is [phone].  

## 2013-09-13 NOTE — Telephone Encounter (Signed)
Per staff message and POF I have scheduled appts.  JMW  

## 2013-09-13 NOTE — Progress Notes (Signed)
[facility]    OFFICE PROGRESS NOTE   INTERVAL HISTORY:   Mr. Pizzo returns for scheduled followup of multiple myeloma. He continues followup for sleep apnea with Dr. Annamaria Boots. He is now wearing oxygen most of the time. He complains of "dizziness ". He has noted increased swelling in the bilateral lower legs and feet. He was admitted in late January with possible pneumonia. He continues Velcade/Decadron therapy.  Objective:  Vital signs in last 24 hours:  Blood pressure 156/88, pulse 92, temperature 98.8 F (37.1 C), temperature source Oral, resp. rate 18, height 6' (1.829 m), weight 258 lb 11.2 oz (117.346 kg), SpO2 96.00%.    HEENT: No thrush or ulcers Resp: Lungs clear bilateral Cardio: Regular rate and rhythm GI: No hepatosplenomegaly Vascular: Trace to 1+ pitting edema at the right greater than left lower leg and ankle   Lab Results:  Lab Results  Component Value Date   WBC 5.2 09/13/2013   HGB 11.8* 09/13/2013   HCT 34.9* 09/13/2013   MCV 86.2 09/13/2013   PLT 128* 09/13/2013   NEUTROABS 3.4 09/13/2013   08/23/2013-serum M spike 0.45, IgG 909   Medications: I have reviewed the patient's current medications.  Assessment/Plan: 1. Multiple myeloma, status post treatment with melphalan/prednisone/thalidomide beginning in November 2009. The thalidomide was increased to 200 mg daily beginning 09/11/2008. The serum M spike was slightly improved on 03/18/2009 and slightly increased on 05/09/2009. He has been maintained off of specific therapy for multiple myeloma since September 2010. The serum M spike was increased on 10/20/2011 at 4.6. He began Revlimid on 11/11/2011. He takes weekly dexamethasone. He began cycle 2 of Revlimid on 12/09/2011. The serum M spike was improved on 12/31/2011. The serum M spike was slightly higher on 01/28/2012. He began cycle 4 on 02/03/2012. He developed recurrent hypercalcemia. Treatment was changed to Cytoxan/Velcade/Decadron beginning  03/02/2012. The serum M spike was stable 07/26/2013; serum IgG mildly increased within the normal range 07/26/2013.  Treatment continued with Velcade/Decadron adenopathy visit 07/31/2013 2. Chronic "dizziness."  3. Chest x-ray 11/01/2008 with a patchy opacity at the right midlung suspicious for pneumonia. He was treated with Avelox. 4. Low back pain with a radicular component. An MRI on 10/07/2008 showed no involvement of the lumbosacral spine with myeloma. 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 5. Bilateral neck pain, status post a CT 05/17/2008 with findings of spinal stenosis and osteoarthritis. 6. 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 Red cell microcytosis, likely related to hemoglobin C trait. A ferritin level returned elevated and a stool Hemoccult was negative 01/23/2010. He underwent a colonoscopy in 2009. 8. Elevated beta-2 microglobulin level. 9. History of hypertension. 10. History of a herniated disk at L4-L5 on MRI an 01/11/2002. 11. Hyperlipidemia. 12. Gastroesophageal reflux disease. 13. History of atypical angina. 14. History of rectal bleeding-large external hemorrhoids noted 02/25/2012. The stool was Hemoccult positive. He has a history of colon polyps. He is followed by [facility]. He was diagnosed with hemorrhoids in September of 2013 while hospitalized. He underwent a band procedure 03/09/2013. He has had no further rectal bleeding. 15. Superficial venous thrombosis of the greater saphenous vein on a Doppler 09/27/2008. Negative for deep vein thrombosis. Symptoms improved with aspirin. 16. Neck pain with numbness/tingling in the arms, hands, and low back with proximal right leg weakness. An MRI of the cervical spine 05/30/2009 showed multilevel spondylosis with  mild cord edema at C4-C5 and C5-C6. He was noted to  have an enhancing lesion of the cord at T3-T4 of unclear etiology. In the lumbar spine there was no change in the spondylosis at L3-L4 and L4-L5. There was no involvement of the cervical or lumbar spine with myeloma. He is status post C4-C5, C5-C6, and C6-C7 anterior cervical diskectomy with fusion by Dr. Annette Stable 08/01/2009. 17. History of mild thrombocytopenia. 18. Indeterminate-age deep vein thrombosis of the left lower extremity on a venous Doppler 08/15/2009. There were also findings consistent with superficial thrombosis involving the right lower extremity 08/15/2009. Coumadin was discontinued in October 2011. 19. Type 2 diabetes. 20. History of hematuria, followed at [facility]. Urinalysis was negative for blood on 07/23/2011. 21. Radicular back pain with MRI of the lumbar spine on 10/19/2011 showing nerve root impingement at L2-L3, L3-L4 and L4-L5 with the most severe at the right L3 nerve roots due to a large disc fragment. Associated right leg weakness. He underwent right L2-3 decompressive laminotomy with right L2 and L3 decompressive foraminotomy; right L2-3 microdiscectomy on 11/01/2011. 22. Constipation He continues a laxative regimen. 23. Hospitalization 11/08/2011 through 11/10/2011 with orthostatic hypotension/dizziness. He improved with intravenous hydration. He had mild hypotension when here on 11/18/2011. We recommended discontinuing Norvasc and Proscar. 24. Hypercalcemia status post pamidronate 11/03/2011. Recurrent hypercalcemia 02/16/2012 status post Zometa. The calcium has remained in normal range. 25. Fractured tooth with associated pain. Status post a tooth extraction by Dr. Enrique Sack, Zometa was placed on hold. 26. Exertional dyspnea. Stable. He was referred to cardiology, pulmonary function studies 06/15/2013 showed a moderately severe diffusion defect. 27. 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    Disposition:  Mr.  Vanderhoef appears stable from a hematologic standpoint. He will continue Velcade/Decadron. We will check an IgG level and serum M spike on 10/04/2013. Mr. Manheim will return for an office visit on 10/11/2013.  He will followup with Dr. Jacelyn Grip for evaluation of the lower leg edema.   Betsy Coder, MD  09/13/2013  5:00 PM

## 2013-09-13 NOTE — Telephone Encounter (Signed)
GAve pt appt for lab and Md for March &  April 2015

## 2013-09-14 ENCOUNTER — Telehealth: Payer: Self-pay | Admitting: Oncology

## 2013-09-14 NOTE — Telephone Encounter (Signed)
Talked to pt daughter gave her appt for lab,MD and chemo for March and April 2015 °

## 2013-09-17 ENCOUNTER — Other Ambulatory Visit: Payer: Self-pay

## 2013-09-17 DIAGNOSIS — C9000 Multiple myeloma not having achieved remission: Secondary | ICD-10-CM

## 2013-09-17 DIAGNOSIS — J302 Other seasonal allergic rhinitis: Secondary | ICD-10-CM

## 2013-09-17 MED ORDER — TAMSULOSIN HCL 0.4 MG PO CAPS: 0.4000 mg | ORAL_CAPSULE | Freq: Two times a day (BID) | ORAL | Status: DC

## 2013-09-17 MED ORDER — POTASSIUM CHLORIDE CRYS ER 20 MEQ PO TBCR: 20.0000 meq | EXTENDED_RELEASE_TABLET | Freq: Every day | ORAL | Status: DC

## 2013-09-17 MED ORDER — AMLODIPINE BESYLATE 2.5 MG PO TABS: 2.5000 mg | ORAL_TABLET | Freq: Every day | ORAL | Status: DC

## 2013-09-17 MED ORDER — FLUTICASONE PROPIONATE 50 MCG/ACT NA SUSP: 2.0000 | Freq: Every day | NASAL | Status: DC

## 2013-09-17 MED ORDER — ATORVASTATIN CALCIUM 20 MG PO TABS: 20.0000 mg | ORAL_TABLET | Freq: Every day | ORAL | Status: DC

## 2013-09-18 ENCOUNTER — Ambulatory Visit
Admission: RE | Admit: 2013-09-18 | Discharge: 2013-09-18 | Disposition: A | Discharge status: Home / Self Care | Payer: Self-pay | Source: Ambulatory Visit | Attending: Family Medicine | Admitting: Family Medicine

## 2013-09-18 ENCOUNTER — Other Ambulatory Visit: Payer: Self-pay | Admitting: Family Medicine

## 2013-09-18 ENCOUNTER — Ambulatory Visit (INDEPENDENT_AMBULATORY_CARE_PROVIDER_SITE_OTHER): Payer: Medicare HMO

## 2013-09-18 ENCOUNTER — Encounter: Payer: Self-pay | Admitting: Oncology

## 2013-09-18 ENCOUNTER — Encounter: Payer: Self-pay | Admitting: Family Medicine

## 2013-09-18 ENCOUNTER — Ambulatory Visit (INDEPENDENT_AMBULATORY_CARE_PROVIDER_SITE_OTHER): Payer: Medicare HMO | Admitting: Family Medicine

## 2013-09-18 VITALS — BP 138/80 | HR 87 | Temp 98.1°F | Ht 72.0 in | Wt 256.0 lb

## 2013-09-18 DIAGNOSIS — G4700 Insomnia, unspecified: Secondary | ICD-10-CM

## 2013-09-18 DIAGNOSIS — N289 Disorder of kidney and ureter, unspecified: Secondary | ICD-10-CM

## 2013-09-18 DIAGNOSIS — G4733 Obstructive sleep apnea (adult) (pediatric): Secondary | ICD-10-CM

## 2013-09-18 DIAGNOSIS — J189 Pneumonia, unspecified organism: Secondary | ICD-10-CM

## 2013-09-18 DIAGNOSIS — J309 Allergic rhinitis, unspecified: Secondary | ICD-10-CM

## 2013-09-18 DIAGNOSIS — G4730 Sleep apnea, unspecified: Secondary | ICD-10-CM

## 2013-09-18 DIAGNOSIS — R059 Cough, unspecified: Secondary | ICD-10-CM

## 2013-09-18 DIAGNOSIS — R600 Localized edema: Secondary | ICD-10-CM

## 2013-09-18 DIAGNOSIS — R609 Edema, unspecified: Secondary | ICD-10-CM

## 2013-09-18 DIAGNOSIS — M5126 Other intervertebral disc displacement, lumbar region: Secondary | ICD-10-CM

## 2013-09-18 DIAGNOSIS — J302 Other seasonal allergic rhinitis: Secondary | ICD-10-CM

## 2013-09-18 DIAGNOSIS — C9000 Multiple myeloma not having achieved remission: Secondary | ICD-10-CM

## 2013-09-18 DIAGNOSIS — E782 Mixed hyperlipidemia: Secondary | ICD-10-CM

## 2013-09-18 DIAGNOSIS — I5030 Unspecified diastolic (congestive) heart failure: Secondary | ICD-10-CM

## 2013-09-18 DIAGNOSIS — R05 Cough: Secondary | ICD-10-CM

## 2013-09-18 DIAGNOSIS — I10 Essential (primary) hypertension: Secondary | ICD-10-CM

## 2013-09-18 DIAGNOSIS — I509 Heart failure, unspecified: Secondary | ICD-10-CM

## 2013-09-18 DIAGNOSIS — M5116 Intervertebral disc disorders with radiculopathy, lumbar region: Secondary | ICD-10-CM

## 2013-09-18 NOTE — Progress Notes (Signed)
[employer] approved cyclophosphamide 50mg  under medicare part b until 07/11/14 auth # [id number]

## 2013-09-18 NOTE — Progress Notes (Signed)
Patient ID: [name], male   DOB: 07-Jun-1936, 78 y.o.   MRN: [id number] SUBJECTIVE: CC: Chief Complaint  Patient presents with  . Follow-up    reck 1 week  no energy c/o phlegm white.    HPI: Still has a bit of phlegm being coughed up. Also, saw Dr Learta Codding his oncologist. Note  Reviewed. He is doing fairly well with his infusions for his multiple myeloma. He is having low energy . Also seen by pulmonary.  His both legs have gotten swollen again, more than usual and he has had a h/o of having superficial venous thrombosis. No chest pain. He is on portable o2 for his low oxygen sats. He is comfortable withthis.  Past Medical History  Diagnosis Date  . GERD (gastroesophageal reflux disease)   . Essential hypertension, benign   . BPH (benign prostatic hyperplasia)   . Lumbar herniated disc     L4-5  . Hyperlipidemia   . Sleep apnea   . Multiple myeloma 02/19/2008  . Arthritis   . Diverticulosis   . Peptic ulcer disease   . Tubular adenoma 02/16/2008    Dr. Silvano Rusk   Past Surgical History  Procedure Laterality Date  . Neck surgery    . Lumbar laminectomy/decompression microdiscectomy  11/01/2011    Procedure: LUMBAR LAMINECTOMY/DECOMPRESSION MICRODISCECTOMY 2 LEVELS;  Surgeon: Charlie Pitter, MD;  Location: [facility];  Service: Neurosurgery;  Laterality: Right;  Lumbar Laminectomy/Microdiscectomy Decompression Lumbar Three-Four, Lumbar Four-Five   . Colonoscopy    . Hemorrhoid banding  2014   History   Social History  . Marital Status: Married    Spouse Name: N/A    Number of Children: 4  . Years of Education: N/A   Occupational History  . retired-disabled-Construction    Social History Main Topics  . Smoking status: Former Smoker -- 2.00 packs/day for 20 years    Types: Cigarettes    Quit date: 07/12/1990  . Smokeless tobacco: Never Used  . Alcohol Use: No  . Drug Use: No  . Sexual Activity: No   Other Topics Concern  . Not on file   Social History Narrative    Pt was adopted.   Married   Programmer, applications   Family History  Problem Relation Age of Onset  . Adopted: Yes   Current Outpatient Prescriptions on File Prior to Visit  Medication Sig Dispense Refill  . acyclovir (ZOVIRAX) 400 MG tablet Take 400 mg by mouth 2 (two) times daily.      Marland Kitchen albuterol (PROVENTIL HFA) 108 (90 BASE) MCG/ACT inhaler Inhale 2 puffs into the lungs every 6 (six) hours as needed for wheezing or shortness of breath.  1 Inhaler  2  . amLODipine (NORVASC) 2.5 MG tablet Take 1 tablet (2.5 mg total) by mouth daily.  90 tablet  0  . aspirin 325 MG EC tablet Take 1 tablet (325 mg total) by mouth 2 (two) times daily. Do not take again until March 20, 2013      . atorvastatin (LIPITOR) 20 MG tablet Take 1 tablet (20 mg total) by mouth daily.  90 tablet  0  . clonazePAM (KLONOPIN) 0.5 MG tablet Take 1/2 to 1 tablet 30 minutes before bedtime as needed  30 tablet  1  . Cyclophosphamide 50 MG CAPS Take every Thursday      . dexamethasone (DECADRON) 4 MG tablet Take 20 mg by mouth. Take 5 tablets on Thursdays when you are not getting chemotherapy.      Marland Kitchen  fluticasone (FLONASE) 50 MCG/ACT nasal spray Place 2 sprays into both nostrils daily.  48 g  0  . polyethylene glycol (MIRALAX / GLYCOLAX) packet Take 17 g by mouth 2 (two) times daily.       . potassium chloride SA (KLOR-CON M20) 20 MEQ tablet Take 1 tablet (20 mEq total) by mouth daily.  90 tablet  0  . PRESCRIPTION MEDICATION Inject as directed every 7 (seven) days. Velcade 1m injection q7d on Thursdays, skips every 4th week.      . prochlorperazine (COMPAZINE) 10 MG tablet Take 10 mg by mouth every 6 (six) hours as needed (for nausea).      . tamsulosin (FLOMAX) 0.4 MG CAPS capsule Take 1 capsule (0.4 mg total) by mouth 2 (two) times daily. With a  Meal.  180 capsule  0  . lactulose (CHRONULAC) 10 GM/15ML solution Take 10 g by mouth 2 (two) times daily as needed.      . metFORMIN (GLUCOPHAGE) 500 MG tablet Take 1 tablet (500  mg total) by mouth 2 (two) times daily with a meal.  60 tablet  0  . NITROSTAT 0.4 MG SL tablet Place 0.4 mg under the tongue as needed for chest pain.       . pantoprazole (PROTONIX) 40 MG tablet Take 1 tablet (40 mg total) by mouth daily.  30 tablet  3   No current facility-administered medications on file prior to visit.   Allergies  Allergen Reactions  . Penicillins Hives and Rash   Immunization History  Administered Date(s) Administered  . Influenza Split 04/23/2011, 04/11/2012  . Influenza,inj,Quad PF,36+ Mos 04/09/2013  . Tdap 07/13/2007   Prior to Admission medications   Medication Sig Start Date End Date Taking? Authorizing Provider  acyclovir (ZOVIRAX) 400 MG tablet Take 400 mg by mouth 2 (two) times daily.   Yes Historical Provider, MD  albuterol (PROVENTIL HFA) 108 (90 BASE) MCG/ACT inhaler Inhale 2 puffs into the lungs every 6 (six) hours as needed for wheezing or shortness of breath. 08/09/13  Yes CAllie Bossier MD  amLODipine (NORVASC) 2.5 MG tablet Take 1 tablet (2.5 mg total) by mouth daily. 09/17/13  Yes DChipper Herb MD  aspirin 325 MG EC tablet Take 1 tablet (325 mg total) by mouth 2 (two) times daily. Do not take again until March 20, 2013 03/06/13  Yes CGatha Mayer MD  atorvastatin (LIPITOR) 20 MG tablet Take 1 tablet (20 mg total) by mouth daily. 09/17/13  Yes DChipper Herb MD  clonazePAM (Bobbye [place] 0.5 MG tablet Take 1/2 to 1 tablet 30 minutes before bedtime as needed 09/12/13  Yes CDeneise Lever MD  Cyclophosphamide 50 MG CAPS Take every Thursday 07/25/13  Yes Historical Provider, MD  dexamethasone (DECADRON) 4 MG tablet Take 20 mg by mouth. Take 5 tablets on Thursdays when you are not getting chemotherapy.   Yes Historical Provider, MD  fluticasone (FLONASE) 50 MCG/ACT nasal spray Place 2 sprays into both nostrils daily. 09/17/13  Yes DChipper Herb MD  polyethylene glycol ([facility]/ GFloria Raveling packet Take 17 g by mouth 2 (two) times daily.    Yes Historical  Provider, MD  potassium chloride SA (KLOR-CON M20) 20 MEQ tablet Take 1 tablet (20 mEq total) by mouth daily. 09/17/13  Yes DChipper Herb MD  PRESCRIPTION MEDICATION Inject as directed every 7 (seven) days. Velcade 360minjection q7d on Thursdays, skips every 4th week.   Yes Historical Provider, MD  prochlorperazine (COMPAZINE) 10 MG tablet  Take 10 mg by mouth every 6 (six) hours as needed (for nausea).   Yes Historical Provider, MD  tamsulosin (FLOMAX) 0.4 MG CAPS capsule Take 1 capsule (0.4 mg total) by mouth 2 (two) times daily. With a  Meal. 09/17/13  Yes Chipper Herb, MD  zolpidem Lorrin Mais) 5 MG tablet  08/22/13  Yes Historical Provider, MD  carvedilol (COREG) 3.125 MG tablet  08/09/13   Historical Provider, MD  lactulose (CHRONULAC) 10 GM/15ML solution Take 10 g by mouth 2 (two) times daily as needed. 07/31/13   Owens Shark, NP  metFORMIN (GLUCOPHAGE) 500 MG tablet Take 1 tablet (500 mg total) by mouth 2 (two) times daily with a meal. 08/09/13   Allie Bossier, MD  NITROSTAT 0.4 MG SL tablet Place 0.4 mg under the tongue as needed for chest pain.  12/07/12   Historical Provider, MD  pantoprazole (PROTONIX) 40 MG tablet Take 1 tablet (40 mg total) by mouth daily. 08/14/13   Vernie Shanks, MD     ROS: As above in the HPI. All other systems are stable or negative.  OBJECTIVE: APPEARANCE:  Patient in no acute distress.The patient appeared well nourished and normally developed. Acyanotic. Waist: VITAL SIGNS:BP 138/80  Pulse 87  Temp(Src) 98.1 F (36.7 C) (Oral)  Ht 6' (1.829 m)  Wt 256 lb (116.121 kg)  BMI 34.71 kg/m2  SpO2 95%  Obese AAM. On portable O2.  SKIN: warm and  Dry without overt rashes, tattoos and scars  HEAD and Neck: without JVD, Head and scalp: normal Eyes:No scleral icterus. Fundi normal, eye movements normal. Ears: Auricle normal, canal normal, Tympanic membranes normal, insufflation normal. Nose: normal Throat: normal Neck & thyroid: normal  CHEST &  LUNGS: Chest wall: normal Lungs: Coarse breath sounds.  CVS: Reveals the PMI to be normally located. Regular rhythm, First and Second Heart sounds are normal,  absence of murmurs, rubs or gallops. Peripheral vasculature: Radial pulses: normal  ABDOMEN:  Appearance: Obese Benign, no organomegaly, no masses, no Abdominal Aortic enlargement. No Guarding , no rebound. No Bruits. Bowel sounds: normal  RECTAL: N/A GU: N/A  EXTREMETIES: 2 + pedal edema.  NEUROLOGIC: oriented to time,place and person; nonfocal. Results for orders placed in visit on 09/13/13  CBC WITH DIFFERENTIAL      Result Value Ref Range   WBC 5.2  4.0 - 10.3 10e3/uL   NEUT# 3.4  1.5 - 6.5 10e3/uL   HGB 11.8 (*) 13.0 - 17.1 g/dL   HCT 34.9 (*) 38.4 - 49.9 %   Platelets 128 (*) 140 - 400 10e3/uL   MCV 86.2  79.3 - 98.0 fL   MCH 29.2  27.2 - 33.4 pg   MCHC 33.9  32.0 - 36.0 g/dL   RBC 4.04 (*) 4.20 - 5.82 10e6/uL   RDW 16.0 (*) 11.0 - 14.6 %   lymph# 0.9  0.9 - 3.3 10e3/uL   MONO# 0.8  0.1 - 0.9 10e3/uL   Eosinophils Absolute 0.1  0.0 - 0.5 10e3/uL   Basophils Absolute 0.0  0.0 - 0.1 10e3/uL   NEUT% 64.5  39.0 - 75.0 %   LYMPH% 17.7  14.0 - 49.0 %   MONO% 15.4 (*) 0.0 - 14.0 %   EOS% 2.0  0.0 - 7.0 %   BASO% 0.4  0.0 - 2.0 %    ASSESSMENT: Multiple myeloma  Essential hypertension, benign  Diastolic CHF  Seasonal allergic rhinitis  Renal insufficiency  Obstructive sleep apnea - Plan: DG Chest  2 View  Mixed hyperlipidemia  Lumbar disc herniation with radiculopathy  Insomnia with sleep apnea  Pedal edema - Plan: DG Chest 2 View, US Venous Img Lower Bilateral  Pneumonia - Plan: DG Chest 2 View  Cough - Plan: CANCELED: DG Chest 2 View  PLAN:  Orders Placed This Encounter  Procedures  . DG Chest 2 View    Standing Status: Future     Number of Occurrences: 1     Standing Expiration Date: 11/19/2014    Order Specific Question:  Reason for Exam (SYMPTOM  OR DIAGNOSIS REQUIRED)     Answer:  OSA, hypoxia, on O2, recent pneumonia, pedal edema    Order Specific Question:  Preferred imaging location?    Answer:  Internal  . US Venous Img Lower Bilateral    Standing Status: Future     Number of Occurrences:      Standing Expiration Date: 11/19/2014    Order Specific Question:  Reason for Exam (SYMPTOM  OR DIAGNOSIS REQUIRED)    Answer:  bilateral pedal edema. h/o thrombophlebitis. being treated for Multiple myeloma. R/o DVT    Order Specific Question:  Preferred imaging location?    Answer:  [facility]  [facility] reading (PRIMARY) by  Dr. Jacelyn Grip: chronic changes , no acute findings. No consolidation.                              Await Venous dopplers to r/o DVT.  Continue with Oncology. Continue present regimen. Meds ordered this encounter  Medications  . carvedilol (COREG) 3.125 MG tablet    Sig:   . DISCONTD: omeprazole (PRILOSEC) 20 MG capsule    Sig:   . zolpidem (AMBIEN) 5 MG tablet    Sig:    Medications Discontinued During This Encounter  Medication Reason  . omeprazole (PRILOSEC) 20 MG capsule Change in therapy   Return in about 4 weeks (around 10/16/2013) for Recheck medical problems.  Chiniqua Kilcrease P. Jacelyn Grip, M.D.

## 2013-09-20 ENCOUNTER — Other Ambulatory Visit (HOSPITAL_BASED_OUTPATIENT_CLINIC_OR_DEPARTMENT_OTHER): Payer: Medicare HMO

## 2013-09-20 ENCOUNTER — Ambulatory Visit (HOSPITAL_BASED_OUTPATIENT_CLINIC_OR_DEPARTMENT_OTHER): Payer: Medicare HMO

## 2013-09-20 VITALS — BP 130/65 | HR 98 | Temp 97.7°F | Resp 20

## 2013-09-20 DIAGNOSIS — Z5112 Encounter for antineoplastic immunotherapy: Secondary | ICD-10-CM

## 2013-09-20 DIAGNOSIS — C9000 Multiple myeloma not having achieved remission: Secondary | ICD-10-CM

## 2013-09-20 LAB — CBC WITH DIFFERENTIAL/PLATELET
BASO%: 0.8 % (ref 0.0–2.0)
Basophils Absolute: 0 10*3/uL (ref 0.0–0.1)
EOS%: 3.5 % (ref 0.0–7.0)
Eosinophils Absolute: 0.1 10*3/uL (ref 0.0–0.5)
HCT: 36.2 % — ABNORMAL LOW (ref 38.4–49.9)
HGB: 12.3 g/dL — ABNORMAL LOW (ref 13.0–17.1)
LYMPH#: 1 10*3/uL (ref 0.9–3.3)
LYMPH%: 24.4 % (ref 14.0–49.0)
MCH: 29.2 pg (ref 27.2–33.4)
MCHC: 34 g/dL (ref 32.0–36.0)
MCV: 85.7 fL (ref 79.3–98.0)
MONO#: 0.8 10*3/uL (ref 0.1–0.9)
MONO%: 19.4 % — ABNORMAL HIGH (ref 0.0–14.0)
NEUT%: 51.9 % (ref 39.0–75.0)
NEUTROS ABS: 2.2 10*3/uL (ref 1.5–6.5)
Platelets: 101 10*3/uL — ABNORMAL LOW (ref 140–400)
RBC: 4.22 10*6/uL (ref 4.20–5.82)
RDW: 16.3 % — AB (ref 11.0–14.6)
WBC: 4.2 10*3/uL (ref 4.0–10.3)

## 2013-09-20 MED ORDER — DEXAMETHASONE 4 MG PO TABS
ORAL_TABLET | ORAL | Status: AC
  Filled 2013-09-20: qty 5

## 2013-09-20 MED ORDER — ONDANSETRON HCL 8 MG PO TABS
ORAL_TABLET | ORAL | Status: AC
  Filled 2013-09-20: qty 1

## 2013-09-20 MED ORDER — DEXAMETHASONE 4 MG PO TABS
20.0000 mg | ORAL_TABLET | Freq: Once | ORAL | Status: AC
  Administered 2013-09-20: 20 mg via ORAL

## 2013-09-20 MED ORDER — BORTEZOMIB CHEMO SQ INJECTION 3.5 MG (2.5MG/ML)
1.3000 mg/m2 | Freq: Once | INTRAMUSCULAR | Status: AC
  Administered 2013-09-20: 3 mg via SUBCUTANEOUS
  Filled 2013-09-20: qty 3

## 2013-09-20 MED ORDER — ONDANSETRON HCL 8 MG PO TABS
8.0000 mg | ORAL_TABLET | Freq: Once | ORAL | Status: AC
  Administered 2013-09-20: 8 mg via ORAL

## 2013-09-20 NOTE — Patient Instructions (Signed)
[facility] Discharge Instructions for Patients Receiving Chemotherapy  Today you received the following chemotherapy agents: Velcade.  To help prevent nausea and vomiting after your treatment, we encourage you to take your nausea medication as prescribed.   If you develop nausea and vomiting that is not controlled by your nausea medication, call the clinic.   BELOW ARE SYMPTOMS THAT SHOULD BE REPORTED IMMEDIATELY:  *FEVER GREATER THAN 100.5 F  *CHILLS WITH OR WITHOUT FEVER  NAUSEA AND VOMITING THAT IS NOT CONTROLLED WITH YOUR NAUSEA MEDICATION  *UNUSUAL SHORTNESS OF BREATH  *UNUSUAL BRUISING OR BLEEDING  TENDERNESS IN MOUTH AND THROAT WITH OR WITHOUT PRESENCE OF ULCERS  *URINARY PROBLEMS  *BOWEL PROBLEMS  UNUSUAL RASH Items with * indicate a potential emergency and should be followed up as soon as possible.  Feel free to call the clinic you have any questions or concerns. The clinic phone number is [phone].    

## 2013-09-25 ENCOUNTER — Encounter: Payer: Self-pay | Admitting: Oncology

## 2013-09-25 NOTE — Progress Notes (Signed)
Pt is approved through [employer] for Cytoxan and Velcade from 02/02/13 to 02/01/14.  PT has a balance of $9,213.12.  I will forward copy of letter to billing to request reimbursement.

## 2013-09-27 ENCOUNTER — Encounter: Payer: Self-pay | Admitting: Internal Medicine

## 2013-09-27 NOTE — Assessment & Plan Note (Signed)
He continues to have difficulty complying with CPAP and can't get mask comfortable. Plan-try reducing his CPAP from 10-9. The lower airflow maybe a little more tolerable. Educated.

## 2013-09-27 NOTE — Assessment & Plan Note (Signed)
Plan-continue supplemental oxygen through CPAP. Discussed adjustment of humidifier

## 2013-10-04 ENCOUNTER — Other Ambulatory Visit: Payer: Self-pay | Admitting: Oncology

## 2013-10-04 ENCOUNTER — Ambulatory Visit (HOSPITAL_BASED_OUTPATIENT_CLINIC_OR_DEPARTMENT_OTHER): Payer: Medicare HMO

## 2013-10-04 VITALS — BP 136/73 | HR 79 | Temp 97.8°F | Resp 20

## 2013-10-04 DIAGNOSIS — Z5112 Encounter for antineoplastic immunotherapy: Secondary | ICD-10-CM

## 2013-10-04 DIAGNOSIS — C9000 Multiple myeloma not having achieved remission: Secondary | ICD-10-CM

## 2013-10-04 LAB — CBC WITH DIFFERENTIAL/PLATELET
BASO%: 1 % (ref 0.0–2.0)
BASOS ABS: 0 10*3/uL (ref 0.0–0.1)
EOS%: 3.3 % (ref 0.0–7.0)
Eosinophils Absolute: 0.2 10*3/uL (ref 0.0–0.5)
HCT: 34.1 % — ABNORMAL LOW (ref 38.4–49.9)
HGB: 11.6 g/dL — ABNORMAL LOW (ref 13.0–17.1)
LYMPH%: 17.9 % (ref 14.0–49.0)
MCH: 29.1 pg (ref 27.2–33.4)
MCHC: 33.9 g/dL (ref 32.0–36.0)
MCV: 85.8 fL (ref 79.3–98.0)
MONO#: 0.7 10*3/uL (ref 0.1–0.9)
MONO%: 14.7 % — AB (ref 0.0–14.0)
NEUT#: 3 10*3/uL (ref 1.5–6.5)
NEUT%: 63.1 % (ref 39.0–75.0)
Platelets: 135 10*3/uL — ABNORMAL LOW (ref 140–400)
RBC: 3.97 10*6/uL — AB (ref 4.20–5.82)
RDW: 16.6 % — ABNORMAL HIGH (ref 11.0–14.6)
WBC: 4.7 10*3/uL (ref 4.0–10.3)
lymph#: 0.8 10*3/uL — ABNORMAL LOW (ref 0.9–3.3)

## 2013-10-04 LAB — COMPREHENSIVE METABOLIC PANEL (CC13)
ALT: 11 U/L (ref 0–55)
AST: 20 U/L (ref 5–34)
Albumin: 3.3 g/dL — ABNORMAL LOW (ref 3.5–5.0)
Alkaline Phosphatase: 47 U/L (ref 40–150)
Anion Gap: 10 mEq/L (ref 3–11)
BILIRUBIN TOTAL: 0.77 mg/dL (ref 0.20–1.20)
BUN: 9.3 mg/dL (ref 7.0–26.0)
CO2: 22 mEq/L (ref 22–29)
Calcium: 9 mg/dL (ref 8.4–10.4)
Chloride: 109 mEq/L (ref 98–109)
Creatinine: 0.8 mg/dL (ref 0.7–1.3)
GLUCOSE: 147 mg/dL — AB (ref 70–140)
Potassium: 3.7 mEq/L (ref 3.5–5.1)
SODIUM: 142 meq/L (ref 136–145)
Total Protein: 6.1 g/dL — ABNORMAL LOW (ref 6.4–8.3)

## 2013-10-04 MED ORDER — DEXAMETHASONE 4 MG PO TABS
ORAL_TABLET | ORAL | Status: AC
  Filled 2013-10-04: qty 5

## 2013-10-04 MED ORDER — DEXAMETHASONE 4 MG PO TABS
20.0000 mg | ORAL_TABLET | Freq: Once | ORAL | Status: AC
  Administered 2013-10-04: 20 mg via ORAL

## 2013-10-04 MED ORDER — ONDANSETRON HCL 8 MG PO TABS
8.0000 mg | ORAL_TABLET | Freq: Once | ORAL | Status: AC
  Administered 2013-10-04: 8 mg via ORAL

## 2013-10-04 MED ORDER — BORTEZOMIB CHEMO SQ INJECTION 3.5 MG (2.5MG/ML)
1.3000 mg/m2 | Freq: Once | INTRAMUSCULAR | Status: AC
  Administered 2013-10-04: 3 mg via SUBCUTANEOUS
  Filled 2013-10-04: qty 3

## 2013-10-04 MED ORDER — ONDANSETRON HCL 8 MG PO TABS
ORAL_TABLET | ORAL | Status: AC
  Filled 2013-10-04: qty 1

## 2013-10-04 NOTE — Patient Instructions (Signed)
[facility] Discharge Instructions for Patients Receiving Chemotherapy  Today you received the following chemotherapy agents: Velcade. To help prevent nausea and vomiting after your treatment, we encourage you to take your nausea medication.  If you develop nausea and vomiting that is not controlled by your nausea medication, call the clinic.   BELOW ARE SYMPTOMS THAT SHOULD BE REPORTED IMMEDIATELY:  *FEVER GREATER THAN 100.5 F  *CHILLS WITH OR WITHOUT FEVER  NAUSEA AND VOMITING THAT IS NOT CONTROLLED WITH YOUR NAUSEA MEDICATION  *UNUSUAL SHORTNESS OF BREATH  *UNUSUAL BRUISING OR BLEEDING  TENDERNESS IN MOUTH AND THROAT WITH OR WITHOUT PRESENCE OF ULCERS  *URINARY PROBLEMS  *BOWEL PROBLEMS  UNUSUAL RASH Items with * indicate a potential emergency and should be followed up as soon as possible.  Feel free to call the clinic you have any questions or concerns. The clinic phone number is [phone].    

## 2013-10-09 LAB — PROTEIN ELECTROPHORESIS, SERUM, WITH REFLEX
Albumin ELP: 58.9 % (ref 55.8–66.1)
Alpha-1-Globulin: 4.6 % (ref 2.9–4.9)
Alpha-2-Globulin: 11.1 % (ref 7.1–11.8)
Beta 2: 3.6 % (ref 3.2–6.5)
Beta Globulin: 6.3 % (ref 4.7–7.2)
Gamma Globulin: 15.5 % (ref 11.1–18.8)
M-Spike, %: 0.47 g/dL
Total Protein, Serum Electrophoresis: 5.9 g/dL — ABNORMAL LOW (ref 6.0–8.3)

## 2013-10-09 LAB — IGG: IGG (IMMUNOGLOBIN G), SERUM: 988 mg/dL (ref 650–1600)

## 2013-10-09 LAB — IGG, IGA, IGM
IgA: 70 mg/dL (ref 68–379)
IgG (Immunoglobin G), Serum: 988 mg/dL (ref 650–1600)
IgM, Serum: 31 mg/dL — ABNORMAL LOW (ref 41–251)

## 2013-10-09 LAB — IFE INTERPRETATION

## 2013-10-10 ENCOUNTER — Other Ambulatory Visit: Payer: Self-pay

## 2013-10-11 ENCOUNTER — Other Ambulatory Visit (HOSPITAL_BASED_OUTPATIENT_CLINIC_OR_DEPARTMENT_OTHER): Payer: Medicare HMO

## 2013-10-11 ENCOUNTER — Telehealth: Payer: Self-pay | Admitting: Nurse Practitioner

## 2013-10-11 ENCOUNTER — Ambulatory Visit (HOSPITAL_BASED_OUTPATIENT_CLINIC_OR_DEPARTMENT_OTHER): Payer: Medicare HMO | Admitting: Nurse Practitioner

## 2013-10-11 ENCOUNTER — Ambulatory Visit (HOSPITAL_BASED_OUTPATIENT_CLINIC_OR_DEPARTMENT_OTHER): Payer: Medicare HMO

## 2013-10-11 VITALS — BP 156/89 | HR 96 | Temp 97.4°F | Resp 20 | Ht 72.0 in | Wt 260.4 lb

## 2013-10-11 DIAGNOSIS — D630 Anemia in neoplastic disease: Secondary | ICD-10-CM

## 2013-10-11 DIAGNOSIS — Z5112 Encounter for antineoplastic immunotherapy: Secondary | ICD-10-CM

## 2013-10-11 DIAGNOSIS — Z86718 Personal history of other venous thrombosis and embolism: Secondary | ICD-10-CM

## 2013-10-11 DIAGNOSIS — T451X5A Adverse effect of antineoplastic and immunosuppressive drugs, initial encounter: Secondary | ICD-10-CM

## 2013-10-11 DIAGNOSIS — C9000 Multiple myeloma not having achieved remission: Secondary | ICD-10-CM

## 2013-10-11 DIAGNOSIS — D6481 Anemia due to antineoplastic chemotherapy: Secondary | ICD-10-CM

## 2013-10-11 LAB — CBC WITH DIFFERENTIAL/PLATELET
BASO%: 0.4 % (ref 0.0–2.0)
BASOS ABS: 0 10*3/uL (ref 0.0–0.1)
EOS ABS: 0.1 10*3/uL (ref 0.0–0.5)
EOS%: 2.3 % (ref 0.0–7.0)
HCT: 34.9 % — ABNORMAL LOW (ref 38.4–49.9)
HEMOGLOBIN: 11.7 g/dL — AB (ref 13.0–17.1)
LYMPH%: 24.3 % (ref 14.0–49.0)
MCH: 28.8 pg (ref 27.2–33.4)
MCHC: 33.6 g/dL (ref 32.0–36.0)
MCV: 85.8 fL (ref 79.3–98.0)
MONO#: 0.7 10*3/uL (ref 0.1–0.9)
MONO%: 15.7 % — AB (ref 0.0–14.0)
NEUT#: 2.6 10*3/uL (ref 1.5–6.5)
NEUT%: 57.3 % (ref 39.0–75.0)
PLATELETS: 106 10*3/uL — AB (ref 140–400)
RBC: 4.07 10*6/uL — ABNORMAL LOW (ref 4.20–5.82)
RDW: 16.1 % — ABNORMAL HIGH (ref 11.0–14.6)
WBC: 4.5 10*3/uL (ref 4.0–10.3)
lymph#: 1.1 10*3/uL (ref 0.9–3.3)

## 2013-10-11 MED ORDER — ONDANSETRON HCL 8 MG PO TABS
ORAL_TABLET | ORAL | Status: AC
  Filled 2013-10-11: qty 1

## 2013-10-11 MED ORDER — DEXAMETHASONE 4 MG PO TABS
ORAL_TABLET | ORAL | Status: AC
  Filled 2013-10-11: qty 5

## 2013-10-11 MED ORDER — ONDANSETRON HCL 8 MG PO TABS
8.0000 mg | ORAL_TABLET | Freq: Once | ORAL | Status: AC
  Administered 2013-10-11: 8 mg via ORAL

## 2013-10-11 MED ORDER — DEXAMETHASONE 4 MG PO TABS
20.0000 mg | ORAL_TABLET | Freq: Once | ORAL | Status: AC
  Administered 2013-10-11: 20 mg via ORAL

## 2013-10-11 MED ORDER — BORTEZOMIB CHEMO SQ INJECTION 3.5 MG (2.5MG/ML)
1.3000 mg/m2 | Freq: Once | INTRAMUSCULAR | Status: AC
  Administered 2013-10-11: 3 mg via SUBCUTANEOUS
  Filled 2013-10-11: qty 3

## 2013-10-11 NOTE — Patient Instructions (Signed)
[facility] Discharge Instructions for Patients Receiving Chemotherapy  Today you received the following chemotherapy agent: Velcade   To help prevent nausea and vomiting after your treatment, we encourage you to take your nausea medication as prescribed.    If you develop nausea and vomiting that is not controlled by your nausea medication, call the clinic.   BELOW ARE SYMPTOMS THAT SHOULD BE REPORTED IMMEDIATELY:  *FEVER GREATER THAN 100.5 F  *CHILLS WITH OR WITHOUT FEVER  NAUSEA AND VOMITING THAT IS NOT CONTROLLED WITH YOUR NAUSEA MEDICATION  *UNUSUAL SHORTNESS OF BREATH  *UNUSUAL BRUISING OR BLEEDING  TENDERNESS IN MOUTH AND THROAT WITH OR WITHOUT PRESENCE OF ULCERS  *URINARY PROBLEMS  *BOWEL PROBLEMS  UNUSUAL RASH Items with * indicate a potential emergency and should be followed up as soon as possible.  Feel free to call the clinic you have any questions or concerns. The clinic phone number is [phone].

## 2013-10-11 NOTE — Progress Notes (Signed)
[place] OFFICE PROGRESS NOTE   Diagnosis:  Multiple myeloma.  INTERVAL HISTORY:   [name] returns as scheduled. He continues Velcade/Decadron. He has noted recent itchy, watery eyes. He denies nausea/vomiting. No mouth sores. No diarrhea. No numbness or tingling in his hands or feet. He notes increased bilateral leg edema. For greater than one year he has been experiencing intermittent mild discomfort at the right mid back region. The discomfort tends to be present when he wakes up in the mornings but not on a daily basis. The discomfort does not require pain medication. He continues to have dyspnea on exertion. He utilizes supplemental oxygen as needed.  Objective:  Vital signs in last 24 hours:  Blood pressure 156/89, pulse 96, temperature 97.4 F (36.3 C), temperature source Oral, resp. rate 20, height 6' (1.829 m), weight 260 lb 6.4 oz (118.117 kg), SpO2 92.00%.    HEENT: Mild bilateral conjunctival erythema. Resp: Lungs clear. Cardio: Regular cardiac rhythm. GI: Abdomen soft and nontender. No organomegaly. Vascular: 1+ pitting edema at the lower legs and ankles bilaterally right greater than left.  Lab Results:  Lab Results  Component Value Date   WBC 4.5 10/11/2013   HGB 11.7* 10/11/2013   HCT 34.9* 10/11/2013   MCV 85.8 10/11/2013   PLT 106* 10/11/2013   NEUTROABS 2.6 10/11/2013    Lab Results  Component Value Date   NA 142 10/04/2013    No results found for this basename: CEA,  CA199,  CA125    Imaging:  No results found.  Medications: I have reviewed the patient's current medications.  Assessment/Plan: 1. Multiple myeloma, status post treatment with melphalan/prednisone/thalidomide beginning in November 2009. The thalidomide was increased to 200 mg daily beginning 09/11/2008. The serum M spike was slightly improved on 03/18/2009 and slightly increased on 05/09/2009. He has been maintained off of specific therapy for multiple myeloma since September  2010. The serum M spike was increased on 10/20/2011 at 4.6. He began Revlimid on 11/11/2011 with weekly dexamethasone. He began cycle 2 of Revlimid on 12/09/2011. The serum M spike was improved on 12/31/2011. The serum M spike was slightly higher on 01/28/2012. He began cycle 4 on 02/03/2012. He developed recurrent hypercalcemia. Treatment was changed to Cytoxan/Velcade/Decadron beginning 03/02/2012. The serum M spike was stable 07/26/2013; serum IgG mildly increased within the normal range 07/26/2013. Treatment continued with Velcade/Decadron. Stable serum M spike and IgG 10/04/2013. 2. Chronic "dizziness."  3. Chest x-ray 11/01/2008 with a patchy opacity at the right midlung suspicious for pneumonia. He was treated with Avelox. 4. Low back pain with a radicular component. An MRI on 10/07/2008 showed no involvement of the lumbosacral spine with myeloma. 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 5. Bilateral neck pain, status post a CT 05/17/2008 with findings of spinal stenosis and osteoarthritis. 6. 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 Red cell microcytosis, likely related to hemoglobin C trait. A ferritin level returned elevated and a stool Hemoccult was negative 01/23/2010. He underwent a colonoscopy in 2009. 8. Elevated beta-2 microglobulin level. 9. History of hypertension. 10. History of a herniated disk at L4-L5 on MRI an 01/11/2002. 11. Hyperlipidemia. 12. Gastroesophageal reflux disease. 13. History of atypical angina. 14. History of rectal bleeding-large external hemorrhoids noted 02/25/2012. The stool was Hemoccult positive. He has a history of colon polyps. He is followed by [facility]. He was diagnosed with hemorrhoids in  September of 2013 while hospitalized. He underwent a band procedure 03/09/2013. He has had no further  rectal bleeding. 15. Superficial venous thrombosis of the greater saphenous vein on a Doppler 09/27/2008. Negative for deep vein thrombosis. Symptoms improved with aspirin. 16. Neck pain with numbness/tingling in the arms, hands, and low back with proximal right leg weakness. An MRI of the cervical spine 05/30/2009 showed multilevel spondylosis with mild cord edema at C4-C5 and C5-C6. He was noted to have an enhancing lesion of the cord at T3-T4 of unclear etiology. In the lumbar spine there was no change in the spondylosis at L3-L4 and L4-L5. There was no involvement of the cervical or lumbar spine with myeloma. He is status post C4-C5, C5-C6, and C6-C7 anterior cervical diskectomy with fusion by Dr. Annette Stable 08/01/2009. 17. History of mild thrombocytopenia. 18. Indeterminate-age deep vein thrombosis of the left lower extremity on a venous Doppler 08/15/2009. There were also findings consistent with superficial thrombosis involving the right lower extremity 08/15/2009. Coumadin was discontinued in October 2011. 19. Type 2 diabetes. 20. History of hematuria, followed at [facility]. Urinalysis was negative for blood on 07/23/2011. 21. Radicular back pain with MRI of the lumbar spine on 10/19/2011 showing nerve root impingement at L2-L3, L3-L4 and L4-L5 with the most severe at the right L3 nerve roots due to a large disc fragment. Associated right leg weakness. He underwent right L2-3 decompressive laminotomy with right L2 and L3 decompressive foraminotomy; right L2-3 microdiscectomy on 11/01/2011. 22. Constipation. He continues a laxative regimen. 23. Hospitalization 11/08/2011 through 11/10/2011 with orthostatic hypotension/dizziness. He improved with intravenous hydration. He had mild hypotension when here on 11/18/2011. We recommended discontinuing Norvasc and Proscar. 24. Hypercalcemia status post pamidronate 11/03/2011. Recurrent hypercalcemia 02/16/2012 status post Zometa. The calcium has remained  in normal range. 25. Fractured tooth with associated pain. Status post a tooth extraction by Dr. Enrique Sack, Zometa was placed on hold. 26. Exertional dyspnea. Stable. He was referred to cardiology, pulmonary function studies 06/15/2013 showed a moderately severe diffusion defect. He utilizes supplemental oxygen as needed. He is followed by pulmonary. 27. 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    Disposition: Mr. Davidow remains stable from a hematologic standpoint. He will continue Velcade/Decadron on the current schedule. He will return for a followup visit in one month. He will contact the office in the interim with any problems.  Plan reviewed with Dr. Benay Spice.    Ned Card ANP/GNP-BC   10/11/2013  2:01 PM

## 2013-10-11 NOTE — Telephone Encounter (Signed)
per pof sch lab & appt per LT for 4/30. Chemo trmts set by SD per pof

## 2013-10-16 ENCOUNTER — Ambulatory Visit (INDEPENDENT_AMBULATORY_CARE_PROVIDER_SITE_OTHER): Payer: Medicare HMO | Admitting: Family Medicine

## 2013-10-16 ENCOUNTER — Encounter: Payer: Self-pay | Admitting: Family Medicine

## 2013-10-16 VITALS — BP 142/80 | HR 98 | Temp 97.8°F | Ht 72.0 in | Wt 262.6 lb

## 2013-10-16 DIAGNOSIS — Z860100 Personal history of colon polyps, unspecified: Secondary | ICD-10-CM

## 2013-10-16 DIAGNOSIS — J309 Allergic rhinitis, unspecified: Secondary | ICD-10-CM

## 2013-10-16 DIAGNOSIS — J302 Other seasonal allergic rhinitis: Secondary | ICD-10-CM

## 2013-10-16 DIAGNOSIS — N289 Disorder of kidney and ureter, unspecified: Secondary | ICD-10-CM

## 2013-10-16 DIAGNOSIS — R5383 Other fatigue: Secondary | ICD-10-CM

## 2013-10-16 DIAGNOSIS — C9000 Multiple myeloma not having achieved remission: Secondary | ICD-10-CM

## 2013-10-16 DIAGNOSIS — G4733 Obstructive sleep apnea (adult) (pediatric): Secondary | ICD-10-CM

## 2013-10-16 DIAGNOSIS — M5116 Intervertebral disc disorders with radiculopathy, lumbar region: Secondary | ICD-10-CM

## 2013-10-16 DIAGNOSIS — G4730 Sleep apnea, unspecified: Secondary | ICD-10-CM

## 2013-10-16 DIAGNOSIS — M5126 Other intervertebral disc displacement, lumbar region: Secondary | ICD-10-CM

## 2013-10-16 DIAGNOSIS — K648 Other hemorrhoids: Secondary | ICD-10-CM

## 2013-10-16 DIAGNOSIS — I5030 Unspecified diastolic (congestive) heart failure: Secondary | ICD-10-CM

## 2013-10-16 DIAGNOSIS — R5381 Other malaise: Secondary | ICD-10-CM

## 2013-10-16 DIAGNOSIS — E782 Mixed hyperlipidemia: Secondary | ICD-10-CM

## 2013-10-16 DIAGNOSIS — I10 Essential (primary) hypertension: Secondary | ICD-10-CM

## 2013-10-16 DIAGNOSIS — G4700 Insomnia, unspecified: Secondary | ICD-10-CM

## 2013-10-16 DIAGNOSIS — I509 Heart failure, unspecified: Secondary | ICD-10-CM

## 2013-10-16 MED ORDER — LORATADINE 10 MG PO TABS: 10.0000 mg | ORAL_TABLET | Freq: Every day | ORAL | Status: DC

## 2013-10-16 MED ORDER — CROMOLYN SODIUM 5.2 MG/ACT NA AERS: 1.0000 | INHALATION_SPRAY | Freq: Four times a day (QID) | NASAL | Status: DC

## 2013-10-16 NOTE — Progress Notes (Signed)
Patient ID: [name], male   DOB: 04-Apr-1936, 78 y.o.   MRN: [id number] SUBJECTIVE: CC: Chief Complaint  Patient presents with  . Follow-up    1 month follow up c/o allergies and cold symptoms states tires easily     HPI: 1) recently had a infusion for his multiple myeloma and he usually doesn't feel good for 5 days.  2) having worsening nasal congestion no fever, no cough. It is an irritating post nasal drip. He has oxygen to use portable. He has allergies at this time of th eyear and feels that the flonase isnt working  3)Patient is here for follow up of hypertension: denies Headache;deniesChest Pain;denies weakness;denies Shortness of Breath or Orthopnea;denies Visual changes;denies palpitations;denies cough;;denies symptoms of TIA or stroke; admits to Compliance with medications. denies Problems with medications.   4) dependent edema . He couldn't pull the compression stockings on and his wife has arthritis of her hands and she couldn't pull them on either.  5) other medical problems.  Past Medical History  Diagnosis Date  . GERD (gastroesophageal reflux disease)   . Essential hypertension, benign   . BPH (benign prostatic hyperplasia)   . Lumbar herniated disc     L4-5  . Hyperlipidemia   . Sleep apnea   . Multiple myeloma 02/19/2008  . Arthritis   . Diverticulosis   . Peptic ulcer disease   . Tubular adenoma 02/16/2008    Dr. Silvano Rusk   Past Surgical History  Procedure Laterality Date  . Neck surgery    . Lumbar laminectomy/decompression microdiscectomy  11/01/2011    Procedure: LUMBAR LAMINECTOMY/DECOMPRESSION MICRODISCECTOMY 2 LEVELS;  Surgeon: Charlie Pitter, MD;  Location: [facility];  Service: Neurosurgery;  Laterality: Right;  Lumbar Laminectomy/Microdiscectomy Decompression Lumbar Three-Four, Lumbar Four-Five   . Colonoscopy    . Hemorrhoid banding  2014   History   Social History  . Marital Status: Married    Spouse Name: N/A    Number of Children: 4   . Years of Education: N/A   Occupational History  . retired-disabled-Construction    Social History Main Topics  . Smoking status: Former Smoker -- 2.00 packs/day for 20 years    Types: Cigarettes    Quit date: 07/12/1990  . Smokeless tobacco: Never Used  . Alcohol Use: No  . Drug Use: No  . Sexual Activity: No   Other Topics Concern  . Not on file   Social History Narrative   Pt was adopted.   Married   Programmer, applications   Family History  Problem Relation Age of Onset  . Adopted: Yes   Current Outpatient Prescriptions on File Prior to Visit  Medication Sig Dispense Refill  . acyclovir (ZOVIRAX) 400 MG tablet Take 400 mg by mouth 2 (two) times daily.      Marland Kitchen albuterol (PROVENTIL HFA) 108 (90 BASE) MCG/ACT inhaler Inhale 2 puffs into the lungs every 6 (six) hours as needed for wheezing or shortness of breath.  1 Inhaler  2  . amLODipine (NORVASC) 2.5 MG tablet Take 1 tablet (2.5 mg total) by mouth daily.  90 tablet  0  . aspirin 325 MG EC tablet Take 1 tablet (325 mg total) by mouth 2 (two) times daily. Do not take again until March 20, 2013      . atorvastatin (LIPITOR) 20 MG tablet Take 1 tablet (20 mg total) by mouth daily.  90 tablet  0  . carvedilol (COREG) 3.125 MG tablet       .  clonazePAM (KLONOPIN) 0.5 MG tablet Take 1/2 to 1 tablet 30 minutes before bedtime as needed  30 tablet  1  . Cyclophosphamide 50 MG CAPS Take every Thursday      . dexamethasone (DECADRON) 4 MG tablet Take 20 mg by mouth. Take 5 tablets on Thursdays when you are not getting chemotherapy.      . fluticasone (FLONASE) 50 MCG/ACT nasal spray Place 2 sprays into both nostrils daily.  48 g  0  . lactulose (CHRONULAC) 10 GM/15ML solution Take 10 g by mouth 2 (two) times daily as needed.      . metFORMIN (GLUCOPHAGE) 500 MG tablet Take 1 tablet (500 mg total) by mouth 2 (two) times daily with a meal.  60 tablet  0  . NITROSTAT 0.4 MG SL tablet Place 0.4 mg under the tongue as needed for chest pain.        . pantoprazole (PROTONIX) 40 MG tablet Take 1 tablet (40 mg total) by mouth daily.  30 tablet  3  . polyethylene glycol (MIRALAX / GLYCOLAX) packet Take 17 g by mouth 2 (two) times daily.       . potassium chloride SA (KLOR-CON M20) 20 MEQ tablet Take 1 tablet (20 mEq total) by mouth daily.  90 tablet  0  . PRESCRIPTION MEDICATION Inject as directed every 7 (seven) days. Velcade 44m injection q7d on Thursdays, skips every 4th week.      . prochlorperazine (COMPAZINE) 10 MG tablet Take 10 mg by mouth every 6 (six) hours as needed (for nausea).      . tamsulosin (FLOMAX) 0.4 MG CAPS capsule Take 1 capsule (0.4 mg total) by mouth 2 (two) times daily. With a  Meal.  180 capsule  0   No current facility-administered medications on file prior to visit.   Allergies  Allergen Reactions  . Penicillins Hives and Rash   Immunization History  Administered Date(s) Administered  . Influenza Split 04/23/2011, 04/11/2012  . Influenza,inj,Quad PF,36+ Mos 04/09/2013  . Tdap 07/13/2007   Prior to Admission medications   Medication Sig Start Date End Date Taking? Authorizing Provider  acyclovir (ZOVIRAX) 400 MG tablet Take 400 mg by mouth 2 (two) times daily.   Yes Historical Provider, MD  albuterol (PROVENTIL HFA) 108 (90 BASE) MCG/ACT inhaler Inhale 2 puffs into the lungs every 6 (six) hours as needed for wheezing or shortness of breath. 08/09/13  Yes CAllie Bossier MD  amLODipine (NORVASC) 2.5 MG tablet Take 1 tablet (2.5 mg total) by mouth daily. 09/17/13  Yes DChipper Herb MD  aspirin 325 MG EC tablet Take 1 tablet (325 mg total) by mouth 2 (two) times daily. Do not take again until March 20, 2013 03/06/13  Yes CGatha Mayer MD  atorvastatin (LIPITOR) 20 MG tablet Take 1 tablet (20 mg total) by mouth daily. 09/17/13  Yes DChipper Herb MD  carvedilol (COREG) 3.125 MG tablet  08/09/13  Yes Historical Provider, MD  clonazePAM (KLONOPIN) 0.5 MG tablet Take 1/2 to 1 tablet 30 minutes before bedtime as  needed 09/12/13  Yes CDeneise Lever MD  Cyclophosphamide 50 MG CAPS Take every Thursday 07/25/13  Yes Historical Provider, MD  dexamethasone (DECADRON) 4 MG tablet Take 20 mg by mouth. Take 5 tablets on Thursdays when you are not getting chemotherapy.   Yes Historical Provider, MD  fluticasone (FLONASE) 50 MCG/ACT nasal spray Place 2 sprays into both nostrils daily. 09/17/13  Yes DChipper Herb MD  lactulose ([facility] 10  GM/15ML solution Take 10 g by mouth 2 (two) times daily as needed. 07/31/13  Yes Owens Shark, NP  loratadine (CLARITIN) 10 MG tablet Take 10 mg by mouth daily.   Yes Historical Provider, MD  metFORMIN (GLUCOPHAGE) 500 MG tablet Take 1 tablet (500 mg total) by mouth 2 (two) times daily with a meal. 08/09/13  Yes Allie Bossier, MD  NITROSTAT 0.4 MG SL tablet Place 0.4 mg under the tongue as needed for chest pain.  12/07/12  Yes Historical Provider, MD  pantoprazole (PROTONIX) 40 MG tablet Take 1 tablet (40 mg total) by mouth daily. 08/14/13  Yes Vernie Shanks, MD  polyethylene glycol (MIRALAX / GLYCOLAX) packet Take 17 g by mouth 2 (two) times daily.    Yes Historical Provider, MD  potassium chloride SA (KLOR-CON M20) 20 MEQ tablet Take 1 tablet (20 mEq total) by mouth daily. 09/17/13  Yes Chipper Herb, MD  PRESCRIPTION MEDICATION Inject as directed every 7 (seven) days. Velcade 85m injection q7d on Thursdays, skips every 4th week.   Yes Historical Provider, MD  prochlorperazine (COMPAZINE) 10 MG tablet Take 10 mg by mouth every 6 (six) hours as needed (for nausea).   Yes Historical Provider, MD  tamsulosin (FLOMAX) 0.4 MG CAPS capsule Take 1 capsule (0.4 mg total) by mouth 2 (two) times daily. With a  Meal. 09/17/13  Yes DChipper Herb MD     ROS: As above in the HPI. All other systems are stable or negative.  OBJECTIVE: APPEARANCE:  Patient in no acute distress.The patient appeared well nourished and normally developed. Acyanotic. Waist: VITAL SIGNS:BP 142/80  Pulse 98   Temp(Src) 97.8 F (36.6 C) (Oral)  Ht 6' (1.829 m)  Wt 262 lb 9.6 oz (119.115 kg)  BMI 35.61 kg/m2  SpO2 95%  AAM Obese  SKIN: warm and  Dry without overt rashes, tattoos and scars  HEAD and Neck: without JVD, Head and scalp: normal Eyes:No scleral icterus. Fundi normal, eye movements normal. Ears: Auricle normal, canal normal, Tympanic membranes normal, insufflation normal. Nose: nasal congestion. Post nasal drip. Throat: normal Neck & thyroid: normal  CHEST & LUNGS: Chest wall: normal Lungs: Clear  CVS: Reveals the PMI to be normally located. Regular rhythm, First and Second Heart sounds are normal,  absence of murmurs, rubs or gallops. Peripheral vasculature: Radial pulses: normal Dorsal pedis pulses: normal Posterior pulses: normal  ABDOMEN:  Appearance: Obese Benign, no organomegaly, no masses, no Abdominal Aortic enlargement. No Guarding , no rebound. No Bruits. Bowel sounds: normal  RECTAL: N/A GU: N/A  EXTREMETIES: nonedematous.  MUSCULOSKELETAL:  Uses a cane to ambulate  NEUROLOGIC: oriented to time,place and person;   Results for orders placed in visit on 10/11/13  CBC WITH DIFFERENTIAL      Result Value Ref Range   WBC 4.5  4.0 - 10.3 10e3/uL   NEUT# 2.6  1.5 - 6.5 10e3/uL   HGB 11.7 (*) 13.0 - 17.1 g/dL   HCT 34.9 (*) 38.4 - 49.9 %   Platelets 106 (*) 140 - 400 10e3/uL   MCV 85.8  79.3 - 98.0 fL   MCH 28.8  27.2 - 33.4 pg   MCHC 33.6  32.0 - 36.0 g/dL   RBC 4.07 (*) 4.20 - 5.82 10e6/uL   RDW 16.1 (*) 11.0 - 14.6 %   lymph# 1.1  0.9 - 3.3 10e3/uL   MONO# 0.7  0.1 - 0.9 10e3/uL   Eosinophils Absolute 0.1  0.0 - 0.5 10e3/uL  Basophils Absolute 0.0  0.0 - 0.1 10e3/uL   NEUT% 57.3  39.0 - 75.0 %   LYMPH% 24.3  14.0 - 49.0 %   MONO% 15.7 (*) 0.0 - 14.0 %   EOS% 2.3  0.0 - 7.0 %   BASO% 0.4  0.0 - 2.0 %    ASSESSMENT: Multiple myeloma  Essential hypertension, benign  Diastolic CHF  Seasonal allergic rhinitis  Renal  insufficiency  Obstructive sleep apnea  Mixed hyperlipidemia  Lumbar disc herniation with radiculopathy  Insomnia with sleep apnea  Personal history of colonic adenoma  Hemorrhoids, internal, with bleeding and grade 2 prolapse  Other malaise and fatigue Flare up of his allergies inconjunction with the use of portable oxygen. Causing drying of the nasal mucosa. His  Symptoms are irritating to him.  PLAN: Low pressure thigh hi compression stockings. No orders of the defined types were placed in this encounter.   Meds ordered this encounter  Medications  . loratadine (CLARITIN) 10 MG tablet    Sig: Take 10 mg by mouth daily.  Marland Kitchen loratadine (CLARITIN) 10 MG tablet    Sig: Take 1 tablet (10 mg total) by mouth daily.    Dispense:  30 tablet    Refill:  5  . cromolyn (NASALCROM) 5.2 MG/ACT nasal spray    Sig: Place 1 spray into both nostrils 4 (four) times daily.    Dispense:  26 mL    Refill:  12  patient is stable.  Receiving chemotherapy. Not much to else to offer at this time. There are no discontinued medications. Return in about 2 months (around 12/16/2013) for Recheck medical problems.  Retta Pitcher P. Jacelyn Grip, M.D.

## 2013-10-17 ENCOUNTER — Other Ambulatory Visit: Payer: Self-pay

## 2013-10-18 ENCOUNTER — Other Ambulatory Visit (HOSPITAL_BASED_OUTPATIENT_CLINIC_OR_DEPARTMENT_OTHER): Payer: Medicare HMO

## 2013-10-18 ENCOUNTER — Ambulatory Visit (HOSPITAL_BASED_OUTPATIENT_CLINIC_OR_DEPARTMENT_OTHER): Payer: Medicare HMO

## 2013-10-18 VITALS — BP 146/90 | HR 86 | Temp 97.8°F | Resp 20

## 2013-10-18 DIAGNOSIS — Z5112 Encounter for antineoplastic immunotherapy: Secondary | ICD-10-CM

## 2013-10-18 DIAGNOSIS — C9000 Multiple myeloma not having achieved remission: Secondary | ICD-10-CM

## 2013-10-18 LAB — CBC WITH DIFFERENTIAL/PLATELET
BASO%: 1 % (ref 0.0–2.0)
Basophils Absolute: 0 10*3/uL (ref 0.0–0.1)
EOS ABS: 0.1 10*3/uL (ref 0.0–0.5)
EOS%: 2.5 % (ref 0.0–7.0)
HCT: 36.1 % — ABNORMAL LOW (ref 38.4–49.9)
HGB: 12.1 g/dL — ABNORMAL LOW (ref 13.0–17.1)
LYMPH%: 23.7 % (ref 14.0–49.0)
MCH: 28.8 pg (ref 27.2–33.4)
MCHC: 33.6 g/dL (ref 32.0–36.0)
MCV: 85.6 fL (ref 79.3–98.0)
MONO#: 0.9 10*3/uL (ref 0.1–0.9)
MONO%: 19.4 % — ABNORMAL HIGH (ref 0.0–14.0)
NEUT#: 2.5 10*3/uL (ref 1.5–6.5)
NEUT%: 53.4 % (ref 39.0–75.0)
PLATELETS: 91 10*3/uL — AB (ref 140–400)
RBC: 4.22 10*6/uL (ref 4.20–5.82)
RDW: 15.7 % — ABNORMAL HIGH (ref 11.0–14.6)
WBC: 4.6 10*3/uL (ref 4.0–10.3)
lymph#: 1.1 10*3/uL (ref 0.9–3.3)

## 2013-10-18 MED ORDER — DEXAMETHASONE 4 MG PO TABS
20.0000 mg | ORAL_TABLET | Freq: Once | ORAL | Status: AC
  Administered 2013-10-18: 20 mg via ORAL

## 2013-10-18 MED ORDER — ONDANSETRON HCL 8 MG PO TABS
8.0000 mg | ORAL_TABLET | Freq: Once | ORAL | Status: AC
  Administered 2013-10-18: 8 mg via ORAL

## 2013-10-18 MED ORDER — ONDANSETRON HCL 8 MG PO TABS
ORAL_TABLET | ORAL | Status: AC
  Filled 2013-10-18: qty 1

## 2013-10-18 MED ORDER — DEXAMETHASONE 4 MG PO TABS
ORAL_TABLET | ORAL | Status: AC
  Filled 2013-10-18: qty 5

## 2013-10-18 MED ORDER — BORTEZOMIB CHEMO SQ INJECTION 3.5 MG (2.5MG/ML)
1.3000 mg/m2 | Freq: Once | INTRAMUSCULAR | Status: AC
  Administered 2013-10-18: 3 mg via SUBCUTANEOUS
  Filled 2013-10-18: qty 3

## 2013-10-18 NOTE — Patient Instructions (Signed)
[facility] Discharge Instructions for Patients Receiving Chemotherapy  Today you received the following chemotherapy agents velcade sq  To help prevent nausea and vomiting after your treatment, we encourage you to take your nausea medication if needed   If you develop nausea and vomiting that is not controlled by your nausea medication, call the clinic.   BELOW ARE SYMPTOMS THAT SHOULD BE REPORTED IMMEDIATELY:  *FEVER GREATER THAN 100.5 F  *CHILLS WITH OR WITHOUT FEVER  NAUSEA AND VOMITING THAT IS NOT CONTROLLED WITH YOUR NAUSEA MEDICATION  *UNUSUAL SHORTNESS OF BREATH  *UNUSUAL BRUISING OR BLEEDING  TENDERNESS IN MOUTH AND THROAT WITH OR WITHOUT PRESENCE OF ULCERS  *URINARY PROBLEMS  *BOWEL PROBLEMS  UNUSUAL RASH Items with * indicate a potential emergency and should be followed up as soon as possible.  Feel free to call the clinic you have any questions or concerns. The clinic phone number is [phone].

## 2013-10-18 NOTE — Progress Notes (Signed)
OK to treat with platelet of 91 per Dr Benay Spice

## 2013-10-20 ENCOUNTER — Other Ambulatory Visit: Payer: Self-pay | Admitting: Oncology

## 2013-10-20 DIAGNOSIS — C9000 Multiple myeloma not having achieved remission: Secondary | ICD-10-CM

## 2013-10-23 ENCOUNTER — Ambulatory Visit (INDEPENDENT_AMBULATORY_CARE_PROVIDER_SITE_OTHER): Payer: Medicare HMO | Admitting: Internal Medicine

## 2013-10-23 ENCOUNTER — Encounter: Payer: Self-pay | Admitting: Internal Medicine

## 2013-10-23 VITALS — BP 136/80 | HR 92 | Ht 69.75 in | Wt 263.2 lb

## 2013-10-23 DIAGNOSIS — K648 Other hemorrhoids: Secondary | ICD-10-CM

## 2013-10-23 DIAGNOSIS — K5900 Constipation, unspecified: Secondary | ICD-10-CM

## 2013-10-23 MED ORDER — POLYETHYLENE GLYCOL 3350 17 GM/SCOOP PO POWD: 1.0000 | Freq: Once | ORAL | Status: DC

## 2013-10-23 NOTE — Assessment & Plan Note (Signed)
Increase metamucil to 1 tbsp daily If not effective go to 2 tbsp daily MiraLax extra dose prn or go to bid Avoid prolonged sitting and straing and wait for call to stool as opposed to bloated sensation If this fails f/u me again

## 2013-10-23 NOTE — Progress Notes (Signed)
Subjective:    Patient ID: [name], male    DOB: 01/02/1936, 78 y.o.   MRN: [id number]  HPI The patient is here for f/u with c/o constipation problems. He had undergone successful hemorrhoid banding last year. He has been using MiraLax and intermiittent lactulose. May see some bleeding if he uses lactulose and has multiple stools. Also taking a teaspoon of metamucil each day.  He has been straining to stool when he feels bloated but does not have an urge to defecate then. May go 2-3 days without stools at times, a few times a month and then take the lactulose.  Medications, allergies, past medical history, past surgical history, family history and social history are reviewed and updated in the EMR.  Review of Systems As above - still on Tx for multiple myeloma    Objective:   Physical Exam Obese, chronically ill,  Anoscopy was performed with the patient in the left lateral decubitus position while and revealed a prominent RA internal hemorrhoid and smaller RP and LL - all Grade 1.     Assessment & Plan:  Hemorrhoids, internal, with bleeding and grade 2 prolapse Anoscopy shows persistence but RA prominent and are Grade 1 now Try to improve bowel habits - if still bleeding possibly band RA Avoid sitting and straining  Constipation Increase metamucil to 1 tbsp daily If not effective go to 2 tbsp daily MiraLax extra dose prn or go to bid Avoid prolonged sitting and straing and wait for call to stool as opposed to bloated sensation If this fails f/u me again

## 2013-10-23 NOTE — Assessment & Plan Note (Addendum)
Anoscopy shows persistence but RA prominent and are Grade 1 now Try to improve bowel habits - if still bleeding possibly band RA Avoid sitting and straining

## 2013-10-23 NOTE — Patient Instructions (Signed)
1.  Increase your metamucil to 1 tablespoon every AM.  2.  If no relief go to 2 tablespoons every AM.  3.  Use an extra dose of Miralax if #1 and #2 suggestions don't work, use on a daily basis if needed.  Avoid straining as we discussed.  Follow up with [place] as needed.   I appreciate the opportunity to care for you.

## 2013-10-26 ENCOUNTER — Other Ambulatory Visit: Payer: Self-pay | Admitting: Oncology

## 2013-10-27 ENCOUNTER — Other Ambulatory Visit: Payer: Self-pay | Admitting: Internal Medicine

## 2013-10-28 ENCOUNTER — Other Ambulatory Visit: Payer: Self-pay | Admitting: Oncology

## 2013-10-29 NOTE — Telephone Encounter (Signed)
Ok to refill 

## 2013-10-29 NOTE — Telephone Encounter (Signed)
CY, please advise if okay to refill. Pt has pending OV for 01-2014. Thanks.

## 2013-10-30 ENCOUNTER — Encounter: Payer: Self-pay | Admitting: *Deleted

## 2013-11-01 ENCOUNTER — Other Ambulatory Visit (HOSPITAL_BASED_OUTPATIENT_CLINIC_OR_DEPARTMENT_OTHER): Payer: Medicare HMO

## 2013-11-01 ENCOUNTER — Ambulatory Visit (HOSPITAL_BASED_OUTPATIENT_CLINIC_OR_DEPARTMENT_OTHER): Payer: Medicare HMO

## 2013-11-01 VITALS — BP 136/82 | HR 94 | Temp 97.9°F | Resp 20

## 2013-11-01 DIAGNOSIS — Z5112 Encounter for antineoplastic immunotherapy: Secondary | ICD-10-CM

## 2013-11-01 DIAGNOSIS — C9000 Multiple myeloma not having achieved remission: Secondary | ICD-10-CM

## 2013-11-01 LAB — COMPREHENSIVE METABOLIC PANEL (CC13)
ALT: 7 U/L (ref 0–55)
AST: 20 U/L (ref 5–34)
Albumin: 3.4 g/dL — ABNORMAL LOW (ref 3.5–5.0)
Alkaline Phosphatase: 52 U/L (ref 40–150)
Anion Gap: 9 mEq/L (ref 3–11)
BUN: 8.4 mg/dL (ref 7.0–26.0)
CHLORIDE: 106 meq/L (ref 98–109)
CO2: 24 mEq/L (ref 22–29)
Calcium: 9.5 mg/dL (ref 8.4–10.4)
Creatinine: 0.9 mg/dL (ref 0.7–1.3)
Glucose: 140 mg/dl (ref 70–140)
Potassium: 3.9 mEq/L (ref 3.5–5.1)
Sodium: 139 mEq/L (ref 136–145)
Total Bilirubin: 0.65 mg/dL (ref 0.20–1.20)
Total Protein: 6.4 g/dL (ref 6.4–8.3)

## 2013-11-01 LAB — CBC WITH DIFFERENTIAL/PLATELET
BASO%: 1 % (ref 0.0–2.0)
Basophils Absolute: 0.1 10*3/uL (ref 0.0–0.1)
EOS ABS: 0.2 10*3/uL (ref 0.0–0.5)
EOS%: 3.5 % (ref 0.0–7.0)
HCT: 36.4 % — ABNORMAL LOW (ref 38.4–49.9)
HGB: 12.3 g/dL — ABNORMAL LOW (ref 13.0–17.1)
LYMPH#: 1 10*3/uL (ref 0.9–3.3)
LYMPH%: 20 % (ref 14.0–49.0)
MCH: 28.8 pg (ref 27.2–33.4)
MCHC: 33.8 g/dL (ref 32.0–36.0)
MCV: 85.3 fL (ref 79.3–98.0)
MONO#: 0.7 10*3/uL (ref 0.1–0.9)
MONO%: 13.3 % (ref 0.0–14.0)
NEUT%: 62.2 % (ref 39.0–75.0)
NEUTROS ABS: 3.2 10*3/uL (ref 1.5–6.5)
Platelets: 134 10*3/uL — ABNORMAL LOW (ref 140–400)
RBC: 4.27 10*6/uL (ref 4.20–5.82)
RDW: 16.5 % — AB (ref 11.0–14.6)
WBC: 5.2 10*3/uL (ref 4.0–10.3)

## 2013-11-01 MED ORDER — DEXAMETHASONE 4 MG PO TABS
ORAL_TABLET | ORAL | Status: AC
  Filled 2013-11-01: qty 5

## 2013-11-01 MED ORDER — BORTEZOMIB CHEMO SQ INJECTION 3.5 MG (2.5MG/ML)
1.3000 mg/m2 | Freq: Once | INTRAMUSCULAR | Status: AC
  Administered 2013-11-01: 3 mg via SUBCUTANEOUS
  Filled 2013-11-01: qty 3

## 2013-11-01 MED ORDER — ONDANSETRON HCL 8 MG PO TABS
8.0000 mg | ORAL_TABLET | Freq: Once | ORAL | Status: AC
  Administered 2013-11-01: 8 mg via ORAL

## 2013-11-01 MED ORDER — ONDANSETRON HCL 8 MG PO TABS
ORAL_TABLET | ORAL | Status: AC
  Filled 2013-11-01: qty 1

## 2013-11-01 MED ORDER — DEXAMETHASONE 4 MG PO TABS
20.0000 mg | ORAL_TABLET | Freq: Once | ORAL | Status: AC
Start: 2013-11-01 — End: 2013-11-01
  Administered 2013-11-01: 20 mg via ORAL

## 2013-11-05 LAB — PROTEIN ELECTROPHORESIS, SERUM
ALBUMIN ELP: 58.7 % (ref 55.8–66.1)
ALPHA-2-GLOBULIN: 11.7 % (ref 7.1–11.8)
Alpha-1-Globulin: 4.2 % (ref 2.9–4.9)
BETA 2: 3.8 % (ref 3.2–6.5)
Beta Globulin: 6.3 % (ref 4.7–7.2)
GAMMA GLOBULIN: 15.3 % (ref 11.1–18.8)
M-Spike, %: 0.46 g/dL
Total Protein, Serum Electrophoresis: 6.1 g/dL (ref 6.0–8.3)

## 2013-11-05 LAB — IGG: IgG (Immunoglobin G), Serum: 951 mg/dL (ref 650–1600)

## 2013-11-08 ENCOUNTER — Telehealth: Payer: Self-pay | Admitting: *Deleted

## 2013-11-08 ENCOUNTER — Ambulatory Visit (HOSPITAL_BASED_OUTPATIENT_CLINIC_OR_DEPARTMENT_OTHER): Payer: Medicare HMO

## 2013-11-08 ENCOUNTER — Telehealth: Payer: Self-pay | Admitting: Oncology

## 2013-11-08 ENCOUNTER — Ambulatory Visit (HOSPITAL_BASED_OUTPATIENT_CLINIC_OR_DEPARTMENT_OTHER): Payer: Medicare HMO | Admitting: Nurse Practitioner

## 2013-11-08 ENCOUNTER — Other Ambulatory Visit (HOSPITAL_BASED_OUTPATIENT_CLINIC_OR_DEPARTMENT_OTHER): Payer: Medicare HMO

## 2013-11-08 VITALS — BP 131/73 | HR 109 | Temp 97.0°F | Resp 17 | Ht 69.0 in | Wt 264.5 lb

## 2013-11-08 DIAGNOSIS — C9000 Multiple myeloma not having achieved remission: Secondary | ICD-10-CM

## 2013-11-08 DIAGNOSIS — R0989 Other specified symptoms and signs involving the circulatory and respiratory systems: Secondary | ICD-10-CM

## 2013-11-08 DIAGNOSIS — T451X5A Adverse effect of antineoplastic and immunosuppressive drugs, initial encounter: Secondary | ICD-10-CM

## 2013-11-08 DIAGNOSIS — D630 Anemia in neoplastic disease: Secondary | ICD-10-CM

## 2013-11-08 DIAGNOSIS — I10 Essential (primary) hypertension: Secondary | ICD-10-CM

## 2013-11-08 DIAGNOSIS — R0609 Other forms of dyspnea: Secondary | ICD-10-CM

## 2013-11-08 DIAGNOSIS — D6481 Anemia due to antineoplastic chemotherapy: Secondary | ICD-10-CM

## 2013-11-08 DIAGNOSIS — G8929 Other chronic pain: Secondary | ICD-10-CM

## 2013-11-08 DIAGNOSIS — Z5112 Encounter for antineoplastic immunotherapy: Secondary | ICD-10-CM

## 2013-11-08 DIAGNOSIS — M549 Dorsalgia, unspecified: Secondary | ICD-10-CM

## 2013-11-08 DIAGNOSIS — R5383 Other fatigue: Secondary | ICD-10-CM

## 2013-11-08 DIAGNOSIS — E119 Type 2 diabetes mellitus without complications: Secondary | ICD-10-CM

## 2013-11-08 DIAGNOSIS — R5381 Other malaise: Secondary | ICD-10-CM

## 2013-11-08 LAB — CBC WITH DIFFERENTIAL/PLATELET
BASO%: 0.4 % (ref 0.0–2.0)
BASOS ABS: 0 10*3/uL (ref 0.0–0.1)
EOS ABS: 0.1 10*3/uL (ref 0.0–0.5)
EOS%: 2.5 % (ref 0.0–7.0)
HEMATOCRIT: 34.9 % — AB (ref 38.4–49.9)
HEMOGLOBIN: 12.6 g/dL — AB (ref 13.0–17.1)
LYMPH%: 29.7 % (ref 14.0–49.0)
MCH: 29 pg (ref 27.2–33.4)
MCHC: 36.1 g/dL — ABNORMAL HIGH (ref 32.0–36.0)
MCV: 80.2 fL (ref 79.3–98.0)
MONO#: 0.7 10*3/uL (ref 0.1–0.9)
MONO%: 13.2 % (ref 0.0–14.0)
NEUT#: 3 10*3/uL (ref 1.5–6.5)
NEUT%: 54.2 % (ref 39.0–75.0)
PLATELETS: 92 10*3/uL — AB (ref 140–400)
RBC: 4.35 10*6/uL (ref 4.20–5.82)
RDW: 15.9 % — AB (ref 11.0–14.6)
WBC: 5.5 10*3/uL (ref 4.0–10.3)
lymph#: 1.6 10*3/uL (ref 0.9–3.3)
nRBC: 0 % (ref 0–0)

## 2013-11-08 MED ORDER — DEXAMETHASONE 4 MG PO TABS
ORAL_TABLET | ORAL | Status: AC
  Filled 2013-11-08: qty 5

## 2013-11-08 MED ORDER — ONDANSETRON HCL 8 MG PO TABS
ORAL_TABLET | ORAL | Status: AC
  Filled 2013-11-08: qty 1

## 2013-11-08 MED ORDER — DEXAMETHASONE 4 MG PO TABS
20.0000 mg | ORAL_TABLET | Freq: Once | ORAL | Status: AC
  Administered 2013-11-08: 20 mg via ORAL

## 2013-11-08 MED ORDER — BORTEZOMIB CHEMO SQ INJECTION 3.5 MG (2.5MG/ML)
1.3000 mg/m2 | Freq: Once | INTRAMUSCULAR | Status: AC
  Administered 2013-11-08: 3 mg via SUBCUTANEOUS
  Filled 2013-11-08: qty 3

## 2013-11-08 MED ORDER — ONDANSETRON HCL 8 MG PO TABS
8.0000 mg | ORAL_TABLET | Freq: Once | ORAL | Status: AC
  Administered 2013-11-08: 8 mg via ORAL

## 2013-11-08 NOTE — Telephone Encounter (Signed)
Gave pt appt for lab,md and chemo , emailed Sharyn Lull regarding chemo for May and June

## 2013-11-08 NOTE — Telephone Encounter (Signed)
Per staff message and POF I have scheduled appts.  JMW  

## 2013-11-08 NOTE — Patient Instructions (Signed)
[facility] Discharge Instructions for Patients Receiving Chemotherapy  Today you received the following chemotherapy agents: Velcade.  To help prevent nausea and vomiting after your treatment, we encourage you to take your nausea medication as prescribed.   If you develop nausea and vomiting that is not controlled by your nausea medication, call the clinic.   BELOW ARE SYMPTOMS THAT SHOULD BE REPORTED IMMEDIATELY:  *FEVER GREATER THAN 100.5 F  *CHILLS WITH OR WITHOUT FEVER  NAUSEA AND VOMITING THAT IS NOT CONTROLLED WITH YOUR NAUSEA MEDICATION  *UNUSUAL SHORTNESS OF BREATH  *UNUSUAL BRUISING OR BLEEDING  TENDERNESS IN MOUTH AND THROAT WITH OR WITHOUT PRESENCE OF ULCERS  *URINARY PROBLEMS  *BOWEL PROBLEMS  UNUSUAL RASH Items with * indicate a potential emergency and should be followed up as soon as possible.  Feel free to call the clinic you have any questions or concerns. The clinic phone number is [phone].    

## 2013-11-08 NOTE — Progress Notes (Signed)
Ok to treat with platetlets 92 per Marlynn Perking

## 2013-11-08 NOTE — Progress Notes (Signed)
[place] [facility] OFFICE PROGRESS NOTE   Diagnosis:  Multiple myeloma.  INTERVAL HISTORY:   [name] returns as scheduled. He continues Velcade/dexamethasone. He has occasional mild nausea. No mouth sores. No constipation. He denies numbness or tingling in his hands or feet. He has intermittent cramps in his fingers. He reports a slight increase in chronic low back pain 2-3 months ago. He does not require pain medication. He reports stable chronic right leg weakness. No bowel or bladder dysfunction.  Objective:  Vital signs in last 24 hours:  Blood pressure 131/73, pulse 109, temperature 97 F (36.1 C), temperature source Oral, resp. rate 17, height 5' 9" (1.753 m), weight 264 lb 8 oz (119.976 kg), SpO2 93.00%, peak flow 2 L/min.    HEENT: No thrush or ulcerations. Resp: Lungs clear. Cardio: Regular cardiac rhythm. GI: Soft and nontender. No hepatomegaly. Vascular: Trace to 1+ pitting edema at the lower legs bilaterally. Neuro: Slight decrease in strength right leg. Motor strength otherwise intact.    Lab Results:  Lab Results  Component Value Date   WBC 5.5 11/08/2013   HGB 12.6* 11/08/2013   HCT 34.9* 11/08/2013   MCV 80.2 11/08/2013   PLT 92* 11/08/2013   NEUTROABS 3.0 11/08/2013    Imaging:  No results found.  Medications: I have reviewed the patient's current medications.  Assessment/Plan: 1. Multiple myeloma, status post treatment with melphalan/prednisone/thalidomide beginning in November 2009. The thalidomide was increased to 200 mg daily beginning 09/11/2008. The serum M spike was slightly improved on 03/18/2009 and slightly increased on 05/09/2009. He has been maintained off of specific therapy for multiple myeloma since September 2010. The serum M spike was increased on 10/20/2011 at 4.6. He began Revlimid on 11/11/2011 with weekly dexamethasone. He began cycle 2 of Revlimid on 12/09/2011. The serum M spike was improved on 12/31/2011. The serum M spike was  slightly higher on 01/28/2012. He began cycle 4 on 02/03/2012. He developed recurrent hypercalcemia. Treatment was changed to Cytoxan/Velcade/Decadron beginning 03/02/2012. The serum M spike was stable 07/26/2013; serum IgG mildly increased within the normal range 07/26/2013. Treatment continued with Velcade/Decadron.  Stable serum M spike and IgG 10/04/2013. Stable serum M spike and IgG on 11/01/2013. 2. Chronic "dizziness."  3. Chest x-ray 11/01/2008 with a patchy opacity at the right midlung suspicious for pneumonia. He was treated with Avelox. 4. Low back pain with a radicular component. An MRI on 10/07/2008 showed no involvement of the lumbosacral spine with myeloma. 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 5. Bilateral neck pain, status post a CT 05/17/2008 with findings of spinal stenosis and osteoarthritis. 6. 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 Red cell microcytosis, likely related to hemoglobin C trait. A ferritin level returned elevated and a stool Hemoccult was negative 01/23/2010. He underwent a colonoscopy in 2009. 8. Elevated beta-2 microglobulin level. 9. History of hypertension. 10. History of a herniated disk at L4-L5 on MRI an 01/11/2002. 11. Hyperlipidemia. 12. Gastroesophageal reflux disease. 13. History of atypical angina. 14. History of rectal bleeding-large external hemorrhoids noted 02/25/2012. The stool was Hemoccult positive. He has a history of colon polyps. He is followed by  GI. He was diagnosed with hemorrhoids in September of 2013 while hospitalized. He underwent a band procedure 03/09/2013. He has had no further rectal bleeding. 15. Superficial venous thrombosis of the greater saphenous vein on a Doppler 09/27/2008. Negative for deep vein  thrombosis. Symptoms improved with aspirin. 16. Neck pain  with numbness/tingling in the arms, hands, and low back with proximal right leg weakness. An MRI of the cervical spine 05/30/2009 showed multilevel spondylosis with mild cord edema at C4-C5 and C5-C6. He was noted to have an enhancing lesion of the cord at T3-T4 of unclear etiology. In the lumbar spine there was no change in the spondylosis at L3-L4 and L4-L5. There was no involvement of the cervical or lumbar spine with myeloma. He is status post C4-C5, C5-C6, and C6-C7 anterior cervical diskectomy with fusion by Dr. Annette Stable 08/01/2009. 17. History of mild thrombocytopenia. 18. Indeterminate-age deep vein thrombosis of the left lower extremity on a venous Doppler 08/15/2009. There were also findings consistent with superficial thrombosis involving the right lower extremity 08/15/2009. Coumadin was discontinued in October 2011. 19. Type 2 diabetes. 20. History of hematuria, followed at [facility]. Urinalysis was negative for blood on 07/23/2011. 21. Radicular back pain with MRI of the lumbar spine on 10/19/2011 showing nerve root impingement at L2-L3, L3-L4 and L4-L5 with the most severe at the right L3 nerve roots due to a large disc fragment. Associated right leg weakness. He underwent right L2-3 decompressive laminotomy with right L2 and L3 decompressive foraminotomy; right L2-3 microdiscectomy on 11/01/2011. 22. Constipation. He continues a laxative regimen. 23. Hospitalization 11/08/2011 through 11/10/2011 with orthostatic hypotension/dizziness. He improved with intravenous hydration. He had mild hypotension when here on 11/18/2011. We recommended discontinuing Norvasc and Proscar. 24. Hypercalcemia status post pamidronate 11/03/2011. Recurrent hypercalcemia 02/16/2012 status post Zometa. The calcium has remained in normal range. 25. Fractured tooth with associated pain. Status post a tooth extraction by Dr. Enrique Sack, Zometa was placed on hold. 26. Exertional dyspnea. Stable. He was referred to  cardiology, pulmonary function studies 06/15/2013 showed a moderately severe diffusion defect. He utilizes supplemental oxygen as needed. He is followed by pulmonary. 27. 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    Disposition: [name] remains stable from a hematologic standpoint. Plan to continue Velcade/Decadron on the current schedule. He will return for a followup visit in approximately one month. He understands to contact the office with worsening back pain, leg weakness/numbness, bowel/bladder dysfunction. He and his wife expressed their understanding.  Plan reviewed with Dr. Benay Spice.    Owens Shark ANP/GNP-BC   11/08/2013  1:02 PM

## 2013-11-12 NOTE — Telephone Encounter (Signed)
Called refill to pharmacy voicemail.  

## 2013-11-14 ENCOUNTER — Telehealth: Payer: Self-pay | Admitting: Oncology

## 2013-11-14 NOTE — Telephone Encounter (Signed)
Called pt and left message for appt for tomorrow

## 2013-11-15 ENCOUNTER — Other Ambulatory Visit (HOSPITAL_BASED_OUTPATIENT_CLINIC_OR_DEPARTMENT_OTHER): Payer: Medicare HMO

## 2013-11-15 ENCOUNTER — Ambulatory Visit (HOSPITAL_BASED_OUTPATIENT_CLINIC_OR_DEPARTMENT_OTHER): Payer: Medicare HMO

## 2013-11-15 VITALS — BP 126/82 | HR 88 | Temp 97.8°F

## 2013-11-15 DIAGNOSIS — C9000 Multiple myeloma not having achieved remission: Secondary | ICD-10-CM

## 2013-11-15 DIAGNOSIS — Z5112 Encounter for antineoplastic immunotherapy: Secondary | ICD-10-CM

## 2013-11-15 LAB — CBC WITH DIFFERENTIAL/PLATELET
BASO%: 0.4 % (ref 0.0–2.0)
Basophils Absolute: 0 10*3/uL (ref 0.0–0.1)
EOS%: 3.5 % (ref 0.0–7.0)
Eosinophils Absolute: 0.2 10*3/uL (ref 0.0–0.5)
HCT: 34.9 % — ABNORMAL LOW (ref 38.4–49.9)
HGB: 11.8 g/dL — ABNORMAL LOW (ref 13.0–17.1)
LYMPH%: 24.7 % (ref 14.0–49.0)
MCH: 28.8 pg (ref 27.2–33.4)
MCHC: 33.8 g/dL (ref 32.0–36.0)
MCV: 85 fL (ref 79.3–98.0)
MONO#: 1 10*3/uL — ABNORMAL HIGH (ref 0.1–0.9)
MONO%: 18.5 % — ABNORMAL HIGH (ref 0.0–14.0)
NEUT%: 52.9 % (ref 39.0–75.0)
NEUTROS ABS: 2.8 10*3/uL (ref 1.5–6.5)
Platelets: 81 10*3/uL — ABNORMAL LOW (ref 140–400)
RBC: 4.11 10*6/uL — AB (ref 4.20–5.82)
RDW: 16.4 % — ABNORMAL HIGH (ref 11.0–14.6)
WBC: 5.4 10*3/uL (ref 4.0–10.3)
lymph#: 1.3 10*3/uL (ref 0.9–3.3)

## 2013-11-15 MED ORDER — ONDANSETRON HCL 8 MG PO TABS
ORAL_TABLET | ORAL | Status: AC
  Filled 2013-11-15: qty 1

## 2013-11-15 MED ORDER — ONDANSETRON HCL 8 MG PO TABS
8.0000 mg | ORAL_TABLET | Freq: Once | ORAL | Status: AC
  Administered 2013-11-15: 8 mg via ORAL

## 2013-11-15 MED ORDER — BORTEZOMIB CHEMO SQ INJECTION 3.5 MG (2.5MG/ML)
1.3000 mg/m2 | Freq: Once | INTRAMUSCULAR | Status: AC
  Administered 2013-11-15: 3 mg via SUBCUTANEOUS
  Filled 2013-11-15: qty 3

## 2013-11-15 MED ORDER — DEXAMETHASONE 4 MG PO TABS
20.0000 mg | ORAL_TABLET | Freq: Once | ORAL | Status: AC
  Administered 2013-11-15: 20 mg via ORAL

## 2013-11-15 MED ORDER — DEXAMETHASONE 4 MG PO TABS
ORAL_TABLET | ORAL | Status: AC
  Filled 2013-11-15: qty 5

## 2013-11-15 NOTE — Progress Notes (Signed)
Ok to treat per Dr Benay Spice based off CMET from 4/23, new CMET not necessary.

## 2013-11-15 NOTE — Patient Instructions (Signed)
[facility] Discharge Instructions for Patients Receiving Chemotherapy  Today you received the following chemotherapy agents velcade   To help prevent nausea and vomiting after your treatment, we encourage you to take your nausea medication as directed  If you develop nausea and vomiting that is not controlled by your nausea medication, call the clinic.   BELOW ARE SYMPTOMS THAT SHOULD BE REPORTED IMMEDIATELY:  *FEVER GREATER THAN 100.5 F  *CHILLS WITH OR WITHOUT FEVER  NAUSEA AND VOMITING THAT IS NOT CONTROLLED WITH YOUR NAUSEA MEDICATION  *UNUSUAL SHORTNESS OF BREATH  *UNUSUAL BRUISING OR BLEEDING  TENDERNESS IN MOUTH AND THROAT WITH OR WITHOUT PRESENCE OF ULCERS  *URINARY PROBLEMS  *BOWEL PROBLEMS  UNUSUAL RASH Items with * indicate a potential emergency and should be followed up as soon as possible.  Feel free to call the clinic you have any questions or concerns. The clinic phone number is [phone].  

## 2013-11-25 ENCOUNTER — Other Ambulatory Visit: Payer: Self-pay | Admitting: Oncology

## 2013-11-28 ENCOUNTER — Other Ambulatory Visit: Payer: Self-pay | Admitting: Oncology

## 2013-11-29 ENCOUNTER — Other Ambulatory Visit (HOSPITAL_BASED_OUTPATIENT_CLINIC_OR_DEPARTMENT_OTHER): Payer: Medicare HMO

## 2013-11-29 ENCOUNTER — Ambulatory Visit (HOSPITAL_BASED_OUTPATIENT_CLINIC_OR_DEPARTMENT_OTHER): Payer: Medicare HMO

## 2013-11-29 VITALS — BP 121/76 | HR 86 | Temp 98.0°F | Resp 18

## 2013-11-29 DIAGNOSIS — Z5112 Encounter for antineoplastic immunotherapy: Secondary | ICD-10-CM

## 2013-11-29 DIAGNOSIS — C9000 Multiple myeloma not having achieved remission: Secondary | ICD-10-CM

## 2013-11-29 LAB — CBC WITH DIFFERENTIAL/PLATELET
BASO%: 0.2 % (ref 0.0–2.0)
BASOS ABS: 0 10*3/uL (ref 0.0–0.1)
EOS ABS: 0.1 10*3/uL (ref 0.0–0.5)
EOS%: 2.7 % (ref 0.0–7.0)
HEMATOCRIT: 32.8 % — AB (ref 38.4–49.9)
HGB: 11.8 g/dL — ABNORMAL LOW (ref 13.0–17.1)
LYMPH#: 1.1 10*3/uL (ref 0.9–3.3)
LYMPH%: 24.9 % (ref 14.0–49.0)
MCH: 28.9 pg (ref 27.2–33.4)
MCHC: 36 g/dL (ref 32.0–36.0)
MCV: 80.2 fL (ref 79.3–98.0)
MONO#: 0.7 10*3/uL (ref 0.1–0.9)
MONO%: 15.1 % — ABNORMAL HIGH (ref 0.0–14.0)
NEUT%: 57.1 % (ref 39.0–75.0)
NEUTROS ABS: 2.6 10*3/uL (ref 1.5–6.5)
Platelets: 98 10*3/uL — ABNORMAL LOW (ref 140–400)
RBC: 4.09 10*6/uL — ABNORMAL LOW (ref 4.20–5.82)
RDW: 16.5 % — ABNORMAL HIGH (ref 11.0–14.6)
WBC: 4.5 10*3/uL (ref 4.0–10.3)
nRBC: 0 % (ref 0–0)

## 2013-11-29 MED ORDER — DEXAMETHASONE 4 MG PO TABS
20.0000 mg | ORAL_TABLET | Freq: Once | ORAL | Status: AC
  Administered 2013-11-29: 20 mg via ORAL

## 2013-11-29 MED ORDER — ONDANSETRON HCL 8 MG PO TABS
ORAL_TABLET | ORAL | Status: AC
  Filled 2013-11-29: qty 1

## 2013-11-29 MED ORDER — BORTEZOMIB CHEMO SQ INJECTION 3.5 MG (2.5MG/ML)
1.3000 mg/m2 | Freq: Once | INTRAMUSCULAR | Status: AC
Start: 2013-11-29 — End: 2013-11-29
  Administered 2013-11-29: 3 mg via SUBCUTANEOUS
  Filled 2013-11-29: qty 3

## 2013-11-29 MED ORDER — ONDANSETRON HCL 8 MG PO TABS
8.0000 mg | ORAL_TABLET | Freq: Once | ORAL | Status: AC
Start: 2013-11-29 — End: 2013-11-29
  Administered 2013-11-29: 8 mg via ORAL

## 2013-11-29 MED ORDER — DEXAMETHASONE 4 MG PO TABS
ORAL_TABLET | ORAL | Status: AC
  Filled 2013-11-29: qty 5

## 2013-11-29 NOTE — Patient Instructions (Signed)
[facility] Discharge Instructions for Patients Receiving Chemotherapy  Today you received the following chemotherapy agents Velcade.  To help prevent nausea and vomiting after your treatment, we encourage you to take your nausea medication as directed.    If you develop nausea and vomiting that is not controlled by your nausea medication, call the clinic.   BELOW ARE SYMPTOMS THAT SHOULD BE REPORTED IMMEDIATELY:  *FEVER GREATER THAN 100.5 F  *CHILLS WITH OR WITHOUT FEVER  NAUSEA AND VOMITING THAT IS NOT CONTROLLED WITH YOUR NAUSEA MEDICATION  *UNUSUAL SHORTNESS OF BREATH  *UNUSUAL BRUISING OR BLEEDING  TENDERNESS IN MOUTH AND THROAT WITH OR WITHOUT PRESENCE OF ULCERS  *URINARY PROBLEMS  *BOWEL PROBLEMS  UNUSUAL RASH Items with * indicate a potential emergency and should be followed up as soon as possible.  Feel free to call the clinic you have any questions or concerns. The clinic phone number is [phone].    

## 2013-11-29 NOTE — Progress Notes (Signed)
Per Ned Card, NP okay to treat today with platelets 98 and to use CMET results from 11/01/13. Cindi Carbon, RN

## 2013-12-03 ENCOUNTER — Other Ambulatory Visit: Payer: Self-pay | Admitting: Oncology

## 2013-12-06 ENCOUNTER — Other Ambulatory Visit (HOSPITAL_BASED_OUTPATIENT_CLINIC_OR_DEPARTMENT_OTHER): Payer: Medicare HMO

## 2013-12-06 ENCOUNTER — Other Ambulatory Visit: Payer: Self-pay | Admitting: *Deleted

## 2013-12-06 ENCOUNTER — Ambulatory Visit (HOSPITAL_BASED_OUTPATIENT_CLINIC_OR_DEPARTMENT_OTHER): Payer: Medicare HMO

## 2013-12-06 VITALS — BP 115/73 | HR 89 | Temp 97.0°F

## 2013-12-06 DIAGNOSIS — C9000 Multiple myeloma not having achieved remission: Secondary | ICD-10-CM

## 2013-12-06 DIAGNOSIS — Z5112 Encounter for antineoplastic immunotherapy: Secondary | ICD-10-CM

## 2013-12-06 LAB — COMPREHENSIVE METABOLIC PANEL (CC13)
ALK PHOS: 52 U/L (ref 40–150)
ALT: 9 U/L (ref 0–55)
ANION GAP: 10 meq/L (ref 3–11)
AST: 20 U/L (ref 5–34)
Albumin: 3.3 g/dL — ABNORMAL LOW (ref 3.5–5.0)
BILIRUBIN TOTAL: 0.57 mg/dL (ref 0.20–1.20)
BUN: 10.8 mg/dL (ref 7.0–26.0)
CO2: 22 meq/L (ref 22–29)
CREATININE: 0.9 mg/dL (ref 0.7–1.3)
Calcium: 8.8 mg/dL (ref 8.4–10.4)
Chloride: 110 mEq/L — ABNORMAL HIGH (ref 98–109)
GLUCOSE: 120 mg/dL (ref 70–140)
Potassium: 3.8 mEq/L (ref 3.5–5.1)
SODIUM: 142 meq/L (ref 136–145)
TOTAL PROTEIN: 6.2 g/dL — AB (ref 6.4–8.3)

## 2013-12-06 LAB — CBC WITH DIFFERENTIAL/PLATELET
BASO%: 0.6 % (ref 0.0–2.0)
Basophils Absolute: 0 10*3/uL (ref 0.0–0.1)
EOS%: 2.5 % (ref 0.0–7.0)
Eosinophils Absolute: 0.1 10*3/uL (ref 0.0–0.5)
HEMATOCRIT: 35.1 % — AB (ref 38.4–49.9)
HGB: 11.8 g/dL — ABNORMAL LOW (ref 13.0–17.1)
LYMPH%: 24.6 % (ref 14.0–49.0)
MCH: 28.6 pg (ref 27.2–33.4)
MCHC: 33.6 g/dL (ref 32.0–36.0)
MCV: 85 fL (ref 79.3–98.0)
MONO#: 0.8 10*3/uL (ref 0.1–0.9)
MONO%: 16.6 % — AB (ref 0.0–14.0)
NEUT#: 2.7 10*3/uL (ref 1.5–6.5)
NEUT%: 55.7 % (ref 39.0–75.0)
PLATELETS: 81 10*3/uL — AB (ref 140–400)
RBC: 4.14 10*6/uL — AB (ref 4.20–5.82)
RDW: 16.7 % — ABNORMAL HIGH (ref 11.0–14.6)
WBC: 4.9 10*3/uL (ref 4.0–10.3)
lymph#: 1.2 10*3/uL (ref 0.9–3.3)

## 2013-12-06 MED ORDER — DEXAMETHASONE 4 MG PO TABS
ORAL_TABLET | ORAL | Status: AC
  Filled 2013-12-06: qty 5

## 2013-12-06 MED ORDER — BORTEZOMIB CHEMO SQ INJECTION 3.5 MG (2.5MG/ML)
1.3000 mg/m2 | Freq: Once | INTRAMUSCULAR | Status: AC
  Administered 2013-12-06: 3 mg via SUBCUTANEOUS
  Filled 2013-12-06: qty 3

## 2013-12-06 MED ORDER — DEXAMETHASONE 4 MG PO TABS
20.0000 mg | ORAL_TABLET | Freq: Once | ORAL | Status: AC
  Administered 2013-12-06: 20 mg via ORAL

## 2013-12-06 MED ORDER — ONDANSETRON HCL 8 MG PO TABS
8.0000 mg | ORAL_TABLET | Freq: Once | ORAL | Status: AC
  Administered 2013-12-06: 8 mg via ORAL

## 2013-12-06 MED ORDER — ONDANSETRON HCL 8 MG PO TABS
ORAL_TABLET | ORAL | Status: AC
  Filled 2013-12-06: qty 1

## 2013-12-06 NOTE — Patient Instructions (Signed)
[facility] Discharge Instructions for Patients Receiving Chemotherapy  Today you received the following chemotherapy agents: Velcade.  To help prevent nausea and vomiting after your treatment, we encourage you to take your nausea medication as prescribed.   If you develop nausea and vomiting that is not controlled by your nausea medication, call the clinic.   BELOW ARE SYMPTOMS THAT SHOULD BE REPORTED IMMEDIATELY:  *FEVER GREATER THAN 100.5 F  *CHILLS WITH OR WITHOUT FEVER  NAUSEA AND VOMITING THAT IS NOT CONTROLLED WITH YOUR NAUSEA MEDICATION  *UNUSUAL SHORTNESS OF BREATH  *UNUSUAL BRUISING OR BLEEDING  TENDERNESS IN MOUTH AND THROAT WITH OR WITHOUT PRESENCE OF ULCERS  *URINARY PROBLEMS  *BOWEL PROBLEMS  UNUSUAL RASH Items with * indicate a potential emergency and should be followed up as soon as possible.  Feel free to call the clinic you have any questions or concerns. The clinic phone number is [phone].    

## 2013-12-10 ENCOUNTER — Encounter: Payer: Self-pay | Admitting: Family Medicine

## 2013-12-10 ENCOUNTER — Ambulatory Visit: Payer: Medicare HMO | Admitting: Family Medicine

## 2013-12-10 ENCOUNTER — Ambulatory Visit (INDEPENDENT_AMBULATORY_CARE_PROVIDER_SITE_OTHER): Payer: Medicare HMO | Admitting: Family Medicine

## 2013-12-10 VITALS — BP 136/77 | HR 111 | Temp 97.5°F | Ht 72.0 in | Wt 270.0 lb

## 2013-12-10 DIAGNOSIS — H109 Unspecified conjunctivitis: Secondary | ICD-10-CM

## 2013-12-10 DIAGNOSIS — E785 Hyperlipidemia, unspecified: Secondary | ICD-10-CM

## 2013-12-10 DIAGNOSIS — I10 Essential (primary) hypertension: Secondary | ICD-10-CM

## 2013-12-10 DIAGNOSIS — E119 Type 2 diabetes mellitus without complications: Secondary | ICD-10-CM

## 2013-12-10 DIAGNOSIS — J069 Acute upper respiratory infection, unspecified: Secondary | ICD-10-CM

## 2013-12-10 LAB — PROTEIN ELECTROPHORESIS, SERUM
ALPHA-1-GLOBULIN: 4 % (ref 2.9–4.9)
Albumin ELP: 60.8 % (ref 55.8–66.1)
Alpha-2-Globulin: 10.6 % (ref 7.1–11.8)
BETA GLOBULIN: 6.5 % (ref 4.7–7.2)
Beta 2: 3.1 % — ABNORMAL LOW (ref 3.2–6.5)
Gamma Globulin: 15 % (ref 11.1–18.8)
M-SPIKE, %: 0.47 g/dL
TOTAL PROTEIN, SERUM ELECTROPHOR: 5.9 g/dL — AB (ref 6.0–8.3)

## 2013-12-10 LAB — POCT GLYCOSYLATED HEMOGLOBIN (HGB A1C): Hemoglobin A1C: 7.2

## 2013-12-10 MED ORDER — AZITHROMYCIN 250 MG PO TABS: ORAL_TABLET | ORAL | Status: DC

## 2013-12-10 MED ORDER — ALBUTEROL SULFATE HFA 108 (90 BASE) MCG/ACT IN AERS: 2.0000 | INHALATION_SPRAY | Freq: Four times a day (QID) | RESPIRATORY_TRACT | Status: DC | PRN

## 2013-12-10 MED ORDER — AMLODIPINE BESYLATE 2.5 MG PO TABS: 2.5000 mg | ORAL_TABLET | Freq: Every day | ORAL | Status: DC

## 2013-12-10 MED ORDER — SULFACETAMIDE SODIUM 10 % OP SOLN: 1.0000 [drp] | OPHTHALMIC | Status: DC

## 2013-12-10 NOTE — Progress Notes (Signed)
   Subjective:    Patient ID: [name], male    DOB: 06/08/1936, 78 y.o.   MRN: [id number]  HPI  This 78 y.o. male presents for evaluation of hypertension, OSA, multiple myeoloma, and CKD. He has hx of hypoxia and is wearing nasal cannula oxygen.  He has DOE.  He sees oncology and Pulmonary.  He has been having some congestion and allergies.  He states he has eye drainage.  Review of Systems    No chest pain, SOB, HA, dizziness, vision change, N/V, diarrhea, constipation, dysuria, urinary urgency or frequency, myalgias, arthralgias or rash.  Objective:   Physical Exam  Vital signs noted  Well developed well nourished male.  HEENT - Head atraumatic Normocephalic                Eyes - PERRLA, Conjuctiva - clear Sclera- Clear EOMI                Ears - EAC's Wnl TM's Wnl Gross Hearing WNL                Throat - oropharanx wnl Respiratory - Lungs CTA bilateral Cardiac - RRR S1 and S2 without murmur GI - Abdomen soft Nontender and bowel sounds active x 4      Assessment & Plan:  Hyperlipemia - Plan: Lipid panel  Diabetes type 2, controlled - Plan: POCT glycosylated hemoglobin (Hb A1C)  Essential hypertension, benign - Plan: amLODipine (NORVASC) 2.5 MG tablet  URI (upper respiratory infection) - Plan: azithromycin (ZITHROMAX) 250 MG tablet  Conjunctivitis - Plan: sulfacetamide (BLEPH-10) 10 % ophthalmic solution  Follow up in 3 months  Lysbeth Penner FNP

## 2013-12-11 LAB — LIPID PANEL
Chol/HDL Ratio: 3.1 ratio units (ref 0.0–5.0)
Cholesterol, Total: 169 mg/dL (ref 100–199)
HDL: 54 mg/dL (ref >39)
LDL Calculated: 89 mg/dL (ref 0–99)
Triglycerides: 132 mg/dL (ref 0–149)
VLDL Cholesterol Cal: 26 mg/dL (ref 5–40)

## 2013-12-12 ENCOUNTER — Telehealth: Payer: Self-pay | Admitting: *Deleted

## 2013-12-12 NOTE — Telephone Encounter (Signed)
At MD request, called wife to request Eldar come in on 12/13/13 at 10:00 instead of 11:15. She understands and agrees.

## 2013-12-13 ENCOUNTER — Ambulatory Visit: Payer: Self-pay | Admitting: Oncology

## 2013-12-13 ENCOUNTER — Telehealth: Payer: Self-pay | Admitting: Oncology

## 2013-12-13 ENCOUNTER — Other Ambulatory Visit (HOSPITAL_BASED_OUTPATIENT_CLINIC_OR_DEPARTMENT_OTHER): Payer: Medicare HMO

## 2013-12-13 ENCOUNTER — Other Ambulatory Visit: Payer: Self-pay

## 2013-12-13 ENCOUNTER — Ambulatory Visit (HOSPITAL_BASED_OUTPATIENT_CLINIC_OR_DEPARTMENT_OTHER): Payer: Medicare HMO

## 2013-12-13 ENCOUNTER — Ambulatory Visit (HOSPITAL_BASED_OUTPATIENT_CLINIC_OR_DEPARTMENT_OTHER): Payer: Medicare HMO | Admitting: Oncology

## 2013-12-13 VITALS — BP 160/83 | HR 93 | Temp 97.7°F | Resp 20 | Ht 72.0 in | Wt 270.5 lb

## 2013-12-13 DIAGNOSIS — T451X5A Adverse effect of antineoplastic and immunosuppressive drugs, initial encounter: Secondary | ICD-10-CM

## 2013-12-13 DIAGNOSIS — Z5112 Encounter for antineoplastic immunotherapy: Secondary | ICD-10-CM

## 2013-12-13 DIAGNOSIS — D630 Anemia in neoplastic disease: Secondary | ICD-10-CM

## 2013-12-13 DIAGNOSIS — D6481 Anemia due to antineoplastic chemotherapy: Secondary | ICD-10-CM

## 2013-12-13 DIAGNOSIS — D696 Thrombocytopenia, unspecified: Secondary | ICD-10-CM

## 2013-12-13 DIAGNOSIS — C9000 Multiple myeloma not having achieved remission: Secondary | ICD-10-CM

## 2013-12-13 LAB — CBC WITH DIFFERENTIAL/PLATELET
BASO%: 0.8 % (ref 0.0–2.0)
Basophils Absolute: 0 10*3/uL (ref 0.0–0.1)
EOS%: 2.5 % (ref 0.0–7.0)
Eosinophils Absolute: 0.1 10*3/uL (ref 0.0–0.5)
HCT: 33.8 % — ABNORMAL LOW (ref 38.4–49.9)
HEMOGLOBIN: 11.5 g/dL — AB (ref 13.0–17.1)
LYMPH%: 21.6 % (ref 14.0–49.0)
MCH: 28.9 pg (ref 27.2–33.4)
MCHC: 34 g/dL (ref 32.0–36.0)
MCV: 84.8 fL (ref 79.3–98.0)
MONO#: 0.8 10*3/uL (ref 0.1–0.9)
MONO%: 15.5 % — ABNORMAL HIGH (ref 0.0–14.0)
NEUT#: 2.9 10*3/uL (ref 1.5–6.5)
NEUT%: 59.6 % (ref 39.0–75.0)
Platelets: 69 10*3/uL — ABNORMAL LOW (ref 140–400)
RBC: 3.98 10*6/uL — AB (ref 4.20–5.82)
RDW: 16.9 % — AB (ref 11.0–14.6)
WBC: 4.9 10*3/uL (ref 4.0–10.3)
lymph#: 1.1 10*3/uL (ref 0.9–3.3)

## 2013-12-13 MED ORDER — BORTEZOMIB CHEMO SQ INJECTION 3.5 MG (2.5MG/ML)
1.3000 mg/m2 | Freq: Once | INTRAMUSCULAR | Status: AC
  Administered 2013-12-13: 3 mg via SUBCUTANEOUS
  Filled 2013-12-13: qty 3

## 2013-12-13 MED ORDER — ONDANSETRON HCL 8 MG PO TABS
8.0000 mg | ORAL_TABLET | Freq: Once | ORAL | Status: AC
  Administered 2013-12-13: 8 mg via ORAL

## 2013-12-13 MED ORDER — DEXAMETHASONE 4 MG PO TABS
ORAL_TABLET | ORAL | Status: AC
  Filled 2013-12-13: qty 5

## 2013-12-13 MED ORDER — ONDANSETRON HCL 8 MG PO TABS
ORAL_TABLET | ORAL | Status: AC
  Filled 2013-12-13: qty 1

## 2013-12-13 MED ORDER — DEXAMETHASONE 4 MG PO TABS
20.0000 mg | ORAL_TABLET | Freq: Once | ORAL | Status: AC
  Administered 2013-12-13: 20 mg via ORAL

## 2013-12-13 NOTE — Progress Notes (Signed)
[name] OFFICE PROGRESS NOTE   Diagnosis: Multiple myeloma  INTERVAL HISTORY:   [name] returns as scheduled. He generally feels well. He continues Velcade/Decadron. No peripheral numbness.  Objective:  Vital signs in last 24 hours:  Blood pressure 160/83, pulse 93, temperature 97.7 F (36.5 C), temperature source Oral, resp. rate 20, height 6' (1.829 m), weight 270 lb 8 oz (122.698 kg), SpO2 97.00%.    HEENT: No thrush or ulcers Resp: Decreased breath sounds with coarse rhonchi at the posterior basis, no respiratory distress Cardio: Regular rate and rhythm GI: No hepatosplenomegaly Vascular: Trace pitting edema at the lower leg bilaterally with support stockings in place   Lab Results:  Lab Results  Component Value Date   WBC 4.9 12/13/2013   HGB 11.5* 12/13/2013   HCT 33.8* 12/13/2013   MCV 84.8 12/13/2013   PLT 69* 12/13/2013   NEUTROABS 2.9 12/13/2013   12/06/2013-serum M spike 0.47  Medications: I have reviewed the patient's current medications.  Assessment/Plan: 1. Multiple myeloma, status post treatment with melphalan/prednisone/thalidomide beginning in November 2009. The thalidomide was increased to 200 mg daily beginning 09/11/2008. The serum M spike was slightly improved on 03/18/2009 and slightly increased on 05/09/2009. He has been maintained off of specific therapy for multiple myeloma since September 2010. The serum M spike was increased on 10/20/2011 at 4.6. He began Revlimid on 11/11/2011 with weekly dexamethasone. He began cycle 2 of Revlimid on 12/09/2011. The serum M spike was improved on 12/31/2011. The serum M spike was slightly higher on 01/28/2012. He began cycle 4 on 02/03/2012. He developed recurrent hypercalcemia. Treatment was changed to Cytoxan/Velcade/Decadron beginning 03/02/2012. Treatment continued with Velcade/Decadron.  Stable serum M spike and IgG 10/04/2013.  Stable serum M spike 12/06/2013 2. Chronic "dizziness."  3. Chest  x-ray 11/01/2008 with a patchy opacity at the right midlung suspicious for pneumonia. He was treated with Avelox. 4. Low back pain with a radicular component. An MRI on 10/07/2008 showed no involvement of the lumbosacral spine with myeloma. 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 5. Bilateral neck pain, status post a CT 05/17/2008 with findings of spinal stenosis and osteoarthritis. 6. 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 Red cell microcytosis, likely related to hemoglobin C trait. A ferritin level returned elevated and a stool Hemoccult was negative 01/23/2010. He underwent a colonoscopy in 2009. 8. Elevated beta-2 microglobulin level. 9. History of hypertension. 10. History of a herniated disk at L4-L5 on MRI an 01/11/2002. 11. Hyperlipidemia. 12. Gastroesophageal reflux disease. 13. History of atypical angina. 14. History of rectal bleeding-large external hemorrhoids noted 02/25/2012. The stool was Hemoccult positive. He has a history of colon polyps. He is followed by [facility]. He was diagnosed with hemorrhoids in September of 2013 while hospitalized. He underwent a band procedure 03/09/2013. He has had no further rectal bleeding. 15. Superficial venous thrombosis of the greater saphenous vein on a Doppler 09/27/2008. Negative for deep vein thrombosis. Symptoms improved with aspirin. 16. Neck pain with numbness/tingling in the arms, hands, and low back with proximal right leg weakness. An MRI of the cervical spine 05/30/2009 showed multilevel spondylosis with mild cord edema at C4-C5 and C5-C6. He was noted to have an enhancing lesion of the cord at T3-T4 of unclear etiology. In the lumbar spine there was no change in the spondylosis at L3-L4 and L4-L5. There was no involvement of the  cervical or lumbar spine with myeloma. He is  status post C4-C5, C5-C6, and C6-C7 anterior cervical diskectomy with fusion by Dr. Annette Stable 08/01/2009. 17. Thrombocytopenia-progressive, likely secondary to Velcade 18. Indeterminate-age deep vein thrombosis of the left lower extremity on a venous Doppler 08/15/2009. There were also findings consistent with superficial thrombosis involving the right lower extremity 08/15/2009. Coumadin was discontinued in October 2011. 19. Type 2 diabetes. 20. History of hematuria, followed at [facility]. Urinalysis was negative for blood on 07/23/2011. 21. Radicular back pain with MRI of the lumbar spine on 10/19/2011 showing nerve root impingement at L2-L3, L3-L4 and L4-L5 with the most severe at the right L3 nerve roots due to a large disc fragment. Associated right leg weakness. He underwent right L2-3 decompressive laminotomy with right L2 and L3 decompressive foraminotomy; right L2-3 microdiscectomy on 11/01/2011. 22. Constipation. He continues a laxative regimen. 23. Hospitalization 11/08/2011 through 11/10/2011 with orthostatic hypotension/dizziness. He improved with intravenous hydration. He had mild hypotension when here on 11/18/2011. We recommended discontinuing Norvasc and Proscar. 24. Hypercalcemia status post pamidronate 11/03/2011. Recurrent hypercalcemia 02/16/2012 status post Zometa. The calcium has remained in normal range. 25. Fractured tooth with associated pain. Status post a tooth extraction by Dr. Enrique Sack, Zometa was placed on hold. 26. Exertional dyspnea. Stable. He was referred to cardiology, pulmonary function studies 06/15/2013 showed a moderately severe diffusion defect. He utilizes supplemental oxygen as needed. He is followed by pulmonary. 27. 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   Disposition:  [name] appears stable. The serum M spike was stable on 12/07/2011. The plan is to continue Velcade/Decadron. He will return for an  office visit and repeat laboratory studies on 01/10/2014.  He has developed progressive thrombocytopenia. We will hold Velcade and discontinued aspirin if the platelets are lower next week. He knows to contact us for bleeding.  Ladell Pier, MD  12/13/2013  1:33 PM

## 2013-12-13 NOTE — Patient Instructions (Signed)
[facility] Discharge Instructions for Patients Receiving Chemotherapy  Today you received the following chemotherapy agents: velcade  To help prevent nausea and vomiting after your treatment, we encourage you to take your nausea medication.  Take it as often as prescribed.     If you develop nausea and vomiting that is not controlled by your nausea medication, call the clinic. If it is after clinic hours your family physician or the after hours number for the clinic or go to the Emergency Department.   BELOW ARE SYMPTOMS THAT SHOULD BE REPORTED IMMEDIATELY:  *FEVER GREATER THAN 100.5 F  *CHILLS WITH OR WITHOUT FEVER  NAUSEA AND VOMITING THAT IS NOT CONTROLLED WITH YOUR NAUSEA MEDICATION  *UNUSUAL SHORTNESS OF BREATH  *UNUSUAL BRUISING OR BLEEDING  TENDERNESS IN MOUTH AND THROAT WITH OR WITHOUT PRESENCE OF ULCERS  *URINARY PROBLEMS  *BOWEL PROBLEMS  UNUSUAL RASH Items with * indicate a potential emergency and should be followed up as soon as possible.  Feel free to call the clinic you have any questions or concerns. The clinic phone number is [phone].   I have been informed and understand all the instructions given to me. I know to contact the clinic, my physician, or go to the Emergency Department if any problems should occur. I do not have any questions at this time, but understand that I may call the clinic during office hours   should I have any questions or need assistance in obtaining follow up care.    __________________________________________  _____________  __________ Signature of Patient or Authorized Representative            Date                   Time    __________________________________________ Nurse's Signature    

## 2013-12-13 NOTE — Telephone Encounter (Signed)
gv adn printed appt sched and avs for pt for June and July....sed added tx. °

## 2013-12-17 ENCOUNTER — Ambulatory Visit: Payer: Medicare HMO | Admitting: General Practice

## 2013-12-23 ENCOUNTER — Other Ambulatory Visit: Payer: Self-pay | Admitting: Oncology

## 2013-12-27 ENCOUNTER — Other Ambulatory Visit: Payer: Self-pay | Admitting: Oncology

## 2013-12-27 ENCOUNTER — Ambulatory Visit (HOSPITAL_BASED_OUTPATIENT_CLINIC_OR_DEPARTMENT_OTHER): Payer: Medicare HMO

## 2013-12-27 ENCOUNTER — Other Ambulatory Visit (HOSPITAL_BASED_OUTPATIENT_CLINIC_OR_DEPARTMENT_OTHER): Payer: Medicare HMO

## 2013-12-27 VITALS — BP 139/73 | HR 86 | Temp 97.9°F | Resp 18

## 2013-12-27 DIAGNOSIS — C9000 Multiple myeloma not having achieved remission: Secondary | ICD-10-CM

## 2013-12-27 DIAGNOSIS — Z5112 Encounter for antineoplastic immunotherapy: Secondary | ICD-10-CM

## 2013-12-27 LAB — CBC WITH DIFFERENTIAL/PLATELET
BASO%: 1.2 % (ref 0.0–2.0)
Basophils Absolute: 0.1 10*3/uL (ref 0.0–0.1)
EOS%: 3.8 % (ref 0.0–7.0)
Eosinophils Absolute: 0.2 10*3/uL (ref 0.0–0.5)
HCT: 34.3 % — ABNORMAL LOW (ref 38.4–49.9)
HGB: 11.6 g/dL — ABNORMAL LOW (ref 13.0–17.1)
LYMPH%: 31.3 % (ref 14.0–49.0)
MCH: 28.8 pg (ref 27.2–33.4)
MCHC: 33.8 g/dL (ref 32.0–36.0)
MCV: 85.3 fL (ref 79.3–98.0)
MONO#: 0.7 10*3/uL (ref 0.1–0.9)
MONO%: 15.4 % — AB (ref 0.0–14.0)
NEUT#: 2.2 10*3/uL (ref 1.5–6.5)
NEUT%: 48.3 % (ref 39.0–75.0)
PLATELETS: 89 10*3/uL — AB (ref 140–400)
RBC: 4.02 10*6/uL — ABNORMAL LOW (ref 4.20–5.82)
RDW: 17.3 % — ABNORMAL HIGH (ref 11.0–14.6)
WBC: 4.6 10*3/uL (ref 4.0–10.3)
lymph#: 1.4 10*3/uL (ref 0.9–3.3)

## 2013-12-27 MED ORDER — BORTEZOMIB CHEMO SQ INJECTION 3.5 MG (2.5MG/ML)
1.3000 mg/m2 | Freq: Once | INTRAMUSCULAR | Status: AC
  Administered 2013-12-27: 3 mg via SUBCUTANEOUS
  Filled 2013-12-27: qty 3

## 2013-12-27 MED ORDER — ONDANSETRON HCL 8 MG PO TABS
ORAL_TABLET | ORAL | Status: AC
  Filled 2013-12-27: qty 1

## 2013-12-27 MED ORDER — ONDANSETRON HCL 8 MG PO TABS
8.0000 mg | ORAL_TABLET | Freq: Once | ORAL | Status: AC
  Administered 2013-12-27: 8 mg via ORAL

## 2013-12-27 MED ORDER — DEXAMETHASONE 4 MG PO TABS
20.0000 mg | ORAL_TABLET | Freq: Once | ORAL | Status: AC
Start: 2013-12-27 — End: 2013-12-27
  Administered 2013-12-27: 20 mg via ORAL

## 2013-12-27 MED ORDER — DEXAMETHASONE 4 MG PO TABS
ORAL_TABLET | ORAL | Status: AC
  Filled 2013-12-27: qty 5

## 2013-12-27 NOTE — Patient Instructions (Signed)
[facility] Discharge Instructions for Patients Receiving Chemotherapy  Today you received the following chemotherapy agents Velcade.  To help prevent nausea and vomiting after your treatment, we encourage you to take your nausea medication zofran as ordered.   If you develop nausea and vomiting that is not controlled by your nausea medication, call the clinic.   BELOW ARE SYMPTOMS THAT SHOULD BE REPORTED IMMEDIATELY:  *FEVER GREATER THAN 100.5 F  *CHILLS WITH OR WITHOUT FEVER  NAUSEA AND VOMITING THAT IS NOT CONTROLLED WITH YOUR NAUSEA MEDICATION  *UNUSUAL SHORTNESS OF BREATH  *UNUSUAL BRUISING OR BLEEDING  TENDERNESS IN MOUTH AND THROAT WITH OR WITHOUT PRESENCE OF ULCERS  *URINARY PROBLEMS  *BOWEL PROBLEMS  UNUSUAL RASH Items with * indicate a potential emergency and should be followed up as soon as possible.  Feel free to call the clinic you have any questions or concerns. The clinic phone number is [phone].

## 2013-12-27 NOTE — Progress Notes (Signed)
Discharged at 12:00 with spouse, ambulatory with cane in no distress.

## 2013-12-30 ENCOUNTER — Other Ambulatory Visit: Payer: Self-pay | Admitting: Family Medicine

## 2013-12-30 ENCOUNTER — Other Ambulatory Visit: Payer: Self-pay | Admitting: Oncology

## 2014-01-02 ENCOUNTER — Other Ambulatory Visit: Payer: Self-pay | Admitting: *Deleted

## 2014-01-02 MED ORDER — PANTOPRAZOLE SODIUM 40 MG PO TBEC: 40.0000 mg | DELAYED_RELEASE_TABLET | Freq: Every day | ORAL | Status: DC

## 2014-01-03 ENCOUNTER — Ambulatory Visit: Payer: Medicare HMO

## 2014-01-03 ENCOUNTER — Other Ambulatory Visit (HOSPITAL_BASED_OUTPATIENT_CLINIC_OR_DEPARTMENT_OTHER): Payer: Medicare HMO

## 2014-01-03 DIAGNOSIS — C9000 Multiple myeloma not having achieved remission: Secondary | ICD-10-CM

## 2014-01-03 LAB — CBC WITH DIFFERENTIAL/PLATELET
BASO%: 0.2 % (ref 0.0–2.0)
BASOS ABS: 0 10*3/uL (ref 0.0–0.1)
EOS%: 2.5 % (ref 0.0–7.0)
Eosinophils Absolute: 0.1 10*3/uL (ref 0.0–0.5)
HEMATOCRIT: 32.7 % — AB (ref 38.4–49.9)
HEMOGLOBIN: 11.6 g/dL — AB (ref 13.0–17.1)
LYMPH#: 1.4 10*3/uL (ref 0.9–3.3)
LYMPH%: 27.8 % (ref 14.0–49.0)
MCH: 28.6 pg (ref 27.2–33.4)
MCHC: 35.5 g/dL (ref 32.0–36.0)
MCV: 80.7 fL (ref 79.3–98.0)
MONO#: 0.5 10*3/uL (ref 0.1–0.9)
MONO%: 10.1 % (ref 0.0–14.0)
NEUT#: 2.9 10*3/uL (ref 1.5–6.5)
NEUT%: 59.4 % (ref 39.0–75.0)
RBC: 4.05 10*6/uL — ABNORMAL LOW (ref 4.20–5.82)
RDW: 16.8 % — ABNORMAL HIGH (ref 11.0–14.6)
WBC: 4.9 10*3/uL (ref 4.0–10.3)
nRBC: 0 % (ref 0–0)

## 2014-01-03 NOTE — Progress Notes (Signed)
Per Dr. Benay Spice hold treatment today due to platelet count of 69. Discussed with patient and his wife and they voiced understanding.

## 2014-01-03 NOTE — Patient Instructions (Signed)
[facility] Discharge Instructions for Patients Receiving Chemotherapy  You did not get your treatment today due to platelet count. You have follow up appointment with Dr. Benay Spice with labs 01/10/14  To help prevent nausea and vomiting after your treatment, we encourage you to take your nausea medication.   If you develop nausea and vomiting that is not controlled by your nausea medication, call the clinic.   BELOW ARE SYMPTOMS THAT SHOULD BE REPORTED IMMEDIATELY:  *FEVER GREATER THAN 100.5 F  *CHILLS WITH OR WITHOUT FEVER  NAUSEA AND VOMITING THAT IS NOT CONTROLLED WITH YOUR NAUSEA MEDICATION  *UNUSUAL SHORTNESS OF BREATH  *UNUSUAL BRUISING OR BLEEDING  TENDERNESS IN MOUTH AND THROAT WITH OR WITHOUT PRESENCE OF ULCERS  *URINARY PROBLEMS  *BOWEL PROBLEMS  UNUSUAL RASH Items with * indicate a potential emergency and should be followed up as soon as possible.  Feel free to call the clinic you have any questions or concerns. The clinic phone number is [phone].

## 2014-01-06 ENCOUNTER — Other Ambulatory Visit: Payer: Self-pay | Admitting: Oncology

## 2014-01-10 ENCOUNTER — Telehealth: Payer: Self-pay | Admitting: Oncology

## 2014-01-10 ENCOUNTER — Ambulatory Visit: Payer: Self-pay

## 2014-01-10 ENCOUNTER — Ambulatory Visit (HOSPITAL_BASED_OUTPATIENT_CLINIC_OR_DEPARTMENT_OTHER): Payer: Medicare HMO | Admitting: Nurse Practitioner

## 2014-01-10 ENCOUNTER — Ambulatory Visit (HOSPITAL_BASED_OUTPATIENT_CLINIC_OR_DEPARTMENT_OTHER): Payer: Medicare HMO

## 2014-01-10 VITALS — BP 147/67 | HR 103 | Temp 98.0°F | Resp 20 | Ht 72.0 in | Wt 269.1 lb

## 2014-01-10 DIAGNOSIS — C9000 Multiple myeloma not having achieved remission: Secondary | ICD-10-CM

## 2014-01-10 DIAGNOSIS — D696 Thrombocytopenia, unspecified: Secondary | ICD-10-CM

## 2014-01-10 LAB — COMPREHENSIVE METABOLIC PANEL (CC13)
ALT: 11 U/L (ref 0–55)
ANION GAP: 9 meq/L (ref 3–11)
AST: 22 U/L (ref 5–34)
Albumin: 3.4 g/dL — ABNORMAL LOW (ref 3.5–5.0)
Alkaline Phosphatase: 53 U/L (ref 40–150)
BILIRUBIN TOTAL: 0.69 mg/dL (ref 0.20–1.20)
BUN: 13.1 mg/dL (ref 7.0–26.0)
CO2: 24 mEq/L (ref 22–29)
CREATININE: 1.1 mg/dL (ref 0.7–1.3)
Calcium: 9.3 mg/dL (ref 8.4–10.4)
Chloride: 106 mEq/L (ref 98–109)
GLUCOSE: 154 mg/dL — AB (ref 70–140)
Potassium: 4 mEq/L (ref 3.5–5.1)
SODIUM: 138 meq/L (ref 136–145)
TOTAL PROTEIN: 6.6 g/dL (ref 6.4–8.3)

## 2014-01-10 LAB — CBC WITH DIFFERENTIAL/PLATELET
BASO%: 0.6 % (ref 0.0–2.0)
BASOS ABS: 0 10*3/uL (ref 0.0–0.1)
EOS%: 2.8 % (ref 0.0–7.0)
Eosinophils Absolute: 0.1 10*3/uL (ref 0.0–0.5)
HCT: 33.4 % — ABNORMAL LOW (ref 38.4–49.9)
HEMOGLOBIN: 11.8 g/dL — AB (ref 13.0–17.1)
LYMPH%: 35 % (ref 14.0–49.0)
MCH: 28.4 pg (ref 27.2–33.4)
MCHC: 35.3 g/dL (ref 32.0–36.0)
MCV: 80.5 fL (ref 79.3–98.0)
MONO#: 0.4 10*3/uL (ref 0.1–0.9)
MONO%: 11.8 % (ref 0.0–14.0)
NEUT%: 49.8 % (ref 39.0–75.0)
NEUTROS ABS: 1.8 10*3/uL (ref 1.5–6.5)
PLATELETS: 72 10*3/uL — AB (ref 140–400)
RBC: 4.15 10*6/uL — ABNORMAL LOW (ref 4.20–5.82)
RDW: 17.3 % — ABNORMAL HIGH (ref 11.0–14.6)
WBC: 3.6 10*3/uL — ABNORMAL LOW (ref 4.0–10.3)
lymph#: 1.3 10*3/uL (ref 0.9–3.3)
nRBC: 0 % (ref 0–0)

## 2014-01-10 NOTE — Progress Notes (Addendum)
[name] OFFICE PROGRESS NOTE   Diagnosis:  Multiple myeloma.  INTERVAL HISTORY:   [name] returns as scheduled. Velcade was held last week due to a platelet count of 62,000. He denies any bleeding. He has a good appetite. No nausea or vomiting. Bowels moving regularly. No numbness or tingling in his hands or feet. He periodically notes cramps. He had some swelling in both legs and in the left arm last week. The swelling has resolved. He notes fullness in the right axilla. He denies any fevers or sweats.  Objective:  Vital signs in last 24 hours:  Blood pressure 147/67, pulse 103, temperature 98 F (36.7 C), temperature source Oral, resp. rate 20, height 6' (1.829 m), weight 269 lb 1.6 oz (122.063 kg).    HEENT: No thrush or ulcerations. Lymphatics: No palpable cervical, supraclavicular or axillary lymph nodes. Probable prominent fatty tissue in both axilla right greater than left. Resp: Lungs clear. Cardio: Regular cardiac rhythm. GI: Abdomen soft and nontender. No organomegaly. No mass. Vascular: Trace pitting edema at the lower legs bilaterally. Calves nontender.      Lab Results:  Lab Results  Component Value Date   WBC 3.6* 01/10/2014   HGB 11.8* 01/10/2014   HCT 33.4* 01/10/2014   MCV 80.5 01/10/2014   PLT 72* 01/10/2014   NEUTROABS 1.8 01/10/2014    Imaging:  No results found.  Medications: I have reviewed the patient's current medications.  Assessment/Plan: 1. Multiple myeloma, status post treatment with melphalan/prednisone/thalidomide beginning in November 2009. The thalidomide was increased to 200 mg daily beginning 09/11/2008. The serum M spike was slightly improved on 03/18/2009 and slightly increased on 05/09/2009. He has been maintained off of specific therapy for multiple myeloma since September 2010. The serum M spike was increased on 10/20/2011 at 4.6. He began Revlimid on 11/11/2011 with weekly dexamethasone. He began cycle 2 of Revlimid on  12/09/2011. The serum M spike was improved on 12/31/2011. The serum M spike was slightly higher on 01/28/2012. He began cycle 4 on 02/03/2012. He developed recurrent hypercalcemia. Treatment was changed to Cytoxan/Velcade/Decadron beginning 03/02/2012. Treatment continued with Velcade/Decadron.  Stable serum M spike and IgG 10/04/2013.  Stable serum M spike 12/06/2013 2. Chronic "dizziness."  3. Chest x-ray 11/01/2008 with a patchy opacity at the right midlung suspicious for pneumonia. He was treated with Avelox. 4. Low back pain with a radicular component. An MRI on 10/07/2008 showed no involvement of the lumbosacral spine with myeloma. 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 5. Bilateral neck pain, status post a CT 05/17/2008 with findings of spinal stenosis and osteoarthritis. 6. 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 Red cell microcytosis, likely related to hemoglobin C trait. A ferritin level returned elevated and a stool Hemoccult was negative 01/23/2010. He underwent a colonoscopy in 2009. 8. Elevated beta-2 microglobulin level. 9. History of hypertension. 10. History of a herniated disk at L4-L5 on MRI an 01/11/2002. 11. Hyperlipidemia. 12. Gastroesophageal reflux disease. 13. History of atypical angina. 14. History of rectal bleeding-large external hemorrhoids noted 02/25/2012. The stool was Hemoccult positive. He has a history of colon polyps. He is followed by [facility]. He was diagnosed with hemorrhoids in September of 2013 while hospitalized. He underwent a band procedure 03/09/2013. He has had no further rectal bleeding. 15. Superficial venous thrombosis of the greater saphenous vein on a Doppler 09/27/2008. Negative for deep vein  thrombosis. Symptoms improved with aspirin. 16. Neck pain with numbness/tingling in the arms,  hands, and low back with proximal right leg weakness. An MRI of the cervical spine 05/30/2009 showed multilevel spondylosis with mild cord edema at C4-C5 and C5-C6. He was noted to have an enhancing lesion of the cord at T3-T4 of unclear etiology. In the lumbar spine there was no change in the spondylosis at L3-L4 and L4-L5. There was no involvement of the cervical or lumbar spine with myeloma. He is status post C4-C5, C5-C6, and C6-C7 anterior cervical diskectomy with fusion by Dr. Annette Stable 08/01/2009. 17. Thrombocytopenia-progressive, likely secondary to Velcade. 18. Indeterminate-age deep vein thrombosis of the left lower extremity on a venous Doppler 08/15/2009. There were also findings consistent with superficial thrombosis involving the right lower extremity 08/15/2009. Coumadin was discontinued in October 2011. 19. Type 2 diabetes. 20. History of hematuria, followed at [facility]. Urinalysis was negative for blood on 07/23/2011. 21. Radicular back pain with MRI of the lumbar spine on 10/19/2011 showing nerve root impingement at L2-L3, L3-L4 and L4-L5 with the most severe at the right L3 nerve roots due to a large disc fragment. Associated right leg weakness. He underwent right L2-3 decompressive laminotomy with right L2 and L3 decompressive foraminotomy; right L2-3 microdiscectomy on 11/01/2011. 22. Constipation. He continues a laxative regimen. 23. Hospitalization 11/08/2011 through 11/10/2011 with orthostatic hypotension/dizziness. He improved with intravenous hydration. He had mild hypotension when here on 11/18/2011. We recommended discontinuing Norvasc and Proscar. 24. Hypercalcemia status post pamidronate 11/03/2011. Recurrent hypercalcemia 02/16/2012 status post Zometa. The calcium has remained in normal range. 25. Fractured tooth with associated pain. Status post a tooth extraction by Dr. Enrique Sack, Zometa was placed on hold. 26. Exertional dyspnea. Stable. He was referred to cardiology,  pulmonary function studies 06/15/2013 showed a moderately severe diffusion defect. He utilizes supplemental oxygen as needed. He is followed by pulmonary. 27. 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  28. Soft fullness bilateral axilla right greater than left. Likely due to redistribution of fat related to chronic steroids.   Disposition: [name] appears stable. The serum M spike was stable on 12/06/2013. We will followup on the serum protein electrophoresis and IgG from today.  Velcade was held last week due to thrombocytopenia. The thrombocytopenia is most likely related to Velcade. He continues to have thrombocytopenia on labs today.  Dr. Benay Spice recommends giving [name] a treatment break.  He will return for a CBC in 4 weeks and an office visit in 8 weeks.  The fullness in the axillary regions is likely due to chronic steroid use with redistribution of fat. He understands to contact the office if he notes any change.  Patient seen with Dr. Benay Spice.   Ned Card ANP/GNP-BC   01/10/2014  12:19 PM  This was a shared visit with Ned Card. We decided to place the Velcade/Decadron on hold secondary to persistent thrombocytopenia. We will followup on the IgG level and serum M spike from today.  The axillary fullness is likely fatty change related to chronic Decadron therapy.  Julieanne Manson, M.D.

## 2014-01-10 NOTE — Telephone Encounter (Signed)
Gave pt appt for lab and MD for July and August

## 2014-01-14 ENCOUNTER — Encounter: Payer: Self-pay | Admitting: Internal Medicine

## 2014-01-14 ENCOUNTER — Ambulatory Visit (INDEPENDENT_AMBULATORY_CARE_PROVIDER_SITE_OTHER): Payer: Medicare HMO | Admitting: Internal Medicine

## 2014-01-14 ENCOUNTER — Other Ambulatory Visit: Payer: Self-pay | Admitting: Internal Medicine

## 2014-01-14 VITALS — BP 124/82 | HR 86 | Ht 72.0 in | Wt 268.0 lb

## 2014-01-14 DIAGNOSIS — J449 Chronic obstructive pulmonary disease, unspecified: Secondary | ICD-10-CM

## 2014-01-14 DIAGNOSIS — G4733 Obstructive sleep apnea (adult) (pediatric): Secondary | ICD-10-CM

## 2014-01-14 NOTE — Progress Notes (Signed)
Patient ID: [name], male    DOB: 1936/02/16, 78 y.o.   MRN: [id number]  HPI 03/05/11- 78 year old male former smoker seen because of obstructive sleep apnea on kind referral by Dr. Jacelyn Grip from [facility] in [place]. Wife(?) is here. A diagnostic sleep study on 01/31/2008 was done at [facility] because of insomnia with sleep apnea. This study confirmed moderately severe obstructive sleep apnea with an AHI of 22 per hour. A full face mask became uncomfortable, he got tired of it and stopped using CPAP several months to a year ago. Wife now reports loud snoring and again says he stays sleepy during the day although he has difficulty falling asleep. Bedtime between 8 and 9 PM with sleep latency at least to 30 minutes. He feels that he awakens and will lie awake or get up and wander around in the home  half of each night. He finally gets up around 5 AM. When he is asleep he feels that he is sleeping well but this is quite fragmented. He admits drowsiness during the day if he sits quietly. Occasional cough in the morning. Feels smothered lying supine, better on his sides. Treated for hypertension and elevated cholesterol but he denies heart or lung disease. Denies ENT surgery.  He had smoked heavily but quit 20 years ago.  04/23/11-  78 year old male former smoker seen because of obstructive sleep apnea. A male companion is with him. He has not been able to be compliant with CPAP, blaming pains in the back of his neck and spine related to wearing the CPAP. We discussed how it might be constraining motion until his muscles gets stiff. He wore it for one week out of the two-week auto titration trial. He has an older machine that worked well. He says his mask leak so that was unusable. His home care company would not replace it because he old money. He wants to skip the auto titration, go back to his old machine, change equipment supplier and get a new mask. We discussed CPAP, indications and alternatives,  medical concerns, and the documentation of adequate compliance to meet the Medicare rules. He isn't sure of the pressure setting on his old machine, which was one of the reasons we went for auto titration.  06/04/11- 78 year old male former smoker seen because of obstructive sleep apnea. Wife here. He got a replacement CPAP mask but is using his old machine. Advanced changed his pressure to 10. The current mask does not leak. He complains of insomnia with or without CPAP and that seems to be the biggest problem interfering with a sustained use of CPAP now.  07/30/11- 78 year old male former smoker seen because of obstructive sleep apnea. Wife here He try his old CPAP machine again but complains that humidifier gurgles in the flows even when he tries putting it lower than the level of his head so water runs back into the machine. He tried without she notify her but didn't like that either. Wife doesn't think CPAP stopped his snoring. He asks for a new sleep study which is the documentation we really need. He is sometimes restless at night even if he takes a sleeping pill.  09/02/19- 78 year old male former smoker seen because of obstructive sleep apnea.     NPSG 08/22/11- moderate OSA, AHI 26.8/hr with CPAP titration to 9 cwp for AHI 0.5/ hr. Oxygen was added at 2 L/M due to desaturation, and will need follow-up.  11/29/11- 78 year old male former smoker seen because of obstructive sleep apnea.  Pt hasnt worn mask fo 1 month. pt denies any sob,wheezing, chest congestion He was hospitalized twice for back surgery and I think complication of that surgery. Through that, he dropped off of CPAP. Wife says he still uses it a few hours at a time and it works when he wears it. Control has seemed  good at 9 CWP/ Advanced. Sleep is also disturbed by pain in his knees  02/29/12- 78 year old male former smoker seen because of obstructive sleep apnea.        Wife here  CPAP mask is not working well-unable to get one  until September due to insurance; Trying to wear CPAP every night  for 8-9 hours, but current mask hurts his face.  08/31/12- 78 year old male former smoker seen because of obstructive sleep apnea. FOLLOWS FOR: trying to wear CPAP every night but has hard time with mask around nose and pressure blowing into eyes; also states hard to lay/sleep on back. Leak worse in his preferred R decubitus sleep position. Wife says he snores through at times, but much better with CPAP. He is also concerned about dyspnea, especially with exertion. Dr Benay Spice recently suggested he might need to see a heart doctor. Denies chest pain, cough or wheeze. Apparently not a new problem.  03/05/13- 78 year old male former smoker seen because of obstructive sleep apnea. FOLLOWS FOR: hard to pull air in when putting machine on; then feels like too much air through the night; Wears CPAP 9/ [facility] every night. Would like refill of sleep medication as well.  Ambien has worked well with no problems. We discussed this again. CXR 3//3/14 IMPRESSION:  Negative chest.  Original Report Authenticated By: Orlean Patten, M.D.  07/13/13- 78 year old male former smoker seen because of obstructive sleep apnea. FOLLOWS FOR: pt had PFT done 06-2013 and Dr Bronson Ing wanted pt to be seen due to abnormal PFT results. CPAP Auto 7-12 Advanced.usually okay but at times he has trouble sleeping with it. Download indicates pressure of 10 would work. Now has an incidental cold. Routinely easy dyspnea on exertion without cough or wheeze. Ankles have been swelling. Says cardiac workup negative. ABG on room air 06/15/2013-pH 7.44, PCO2 35.7, PO2 75.7, bicarbonate 24.1. Slight respiratory alkalosis. PFT 06/15/2013-mild obstructive airways disease with insignificant response to bronchodilator, slight air trapping, diffusion severely reduced. FEC 2.88/71%, FEV1 2.28/76%, FEV1/FVC 0.79/105%, FEF 25-75% 1.63/66%. RV/TLC 128%, DLCO 47%. CXR  04/09/13 IMPRESSION:  Minimal bibasilar atelectasis or scarring.  Electronically Signed  By: Lorin Picket M.D.  On: 04/09/2013 16:49  09/03/13- 78 year old male former smoker seen because of obstructive sleep apnea. FOLLOWS FOR: Wearing CPAP 10/ Advanced for about 8-10 hours/night. Feels pressure is too much. Pt states CPAP is giving him a cold and headache. DME is [facility]. Using nasal pillows mask. He finds using this "challenging". Denies nocturnal choking or choking with meals. We discussed how to use the humidifier attachment. CT chest 08/07/13 IMPRESSION:  No pulmonary embolism.  Lower lobe peribronchial thickening and debris within right greater  than left lower lobe bronchi. May reflect bronchitis or aspiration  pneumonitis.  4 cm left hepatic lobe lesion is favored to reflect a hemangioma.  There may be a smaller right hepatic lobe hemangioma versus blush of  variant perfusion.  Electronically Signed  By: Carlos Levering M.D.  On: 08/07/2013 00:50  01/14/14- 78 year old male former smoker seen because of obstructive sleep apnea, complicated by multiple myeloma, anemia, allergic rhinitis, renal insufficiency, asthma with COPD FOLLOWS FOR: Pt states that CPAP 10/ Advanced seems to  be working okay at night. Pt reports mask leaking --into eyes every night.             Wife here We again discussed CPAP importance, comfort issues, alternatives and Some dyspnea on exertion and chest tightness without pain, wheeze, cough or palpitation.  Review of Systems-see HPI Constitutional:   No-   weight loss, night sweats, fevers, chills, +fatigue, lassitude HEENT:   No-  headaches, difficulty swallowing, tooth/dental problems, sore throat,       No-  sneezing, itching, ear ache, +nasal congestion, post nasal drip,  CV:  No-   chest pain, orthopnea, PND, swelling in lower extremities, anasarca, dizziness, palpitations Resp: + shortness of breath with exertion or at rest.              No-    productive cough,  No non-productive cough,  No-  coughing up of blood.              No-   change in color of mucus.  + wheezing.   Skin: No-   rash or lesions. GI:  No-   heartburn, indigestion, abdominal pain, nausea, vomiting,  GU: MS:  No-   joint pain or swelling.   Neuro- normal:  Psych:  No- change in mood or affect. No depression or anxiety.  No memory loss.  Objective:   Physical Exam General- Alert, Oriented, Affect-appropriate, Distress- none acute,  Overweight, calm/laconic but seems intelligent. Yawning Using a walker. Skin- rash-none, lesions- none, excoriation- none Lymphadenopathy- none Head- atraumatic            Eyes- Gross vision intact, PERRLA, conjunctivae clear secretions, +strabismus            Ears- Hearing, canals-normal            Nose- Clear, no-Septal dev, mucus, polyps, erosion, perforation             Throat- Mallampati II-III , mucosa clear , drainage- none, tonsils- atrophic Neck- flexible , trachea midline, no stridor , thyroid nl, carotid no bruit Chest - symmetrical excursion , unlabored           Heart/CV- RRR , no murmur , no gallop  , no rub, nl s1 s2                           +JVD 1 cm , edema+trace, stasis changes- none, varices- none           Lung-  clear, wheeze- none, cough- none , dullness-none, rub- none           Chest wall-  Abd- Br/ Gen/ Rectal- Not done, not indicated Extrem- cyanosis- none, clubbing, none, atrophy- none, strength- nl Neuro- grossly intact to observation

## 2014-01-14 NOTE — Patient Instructions (Signed)
Order- DME [facility]- change CPAP to Auto PAP 7-10 and check for mask fit please   Dx OSA

## 2014-01-16 LAB — PROTEIN ELECTROPHORESIS, SERUM, WITH REFLEX
ALPHA-1-GLOBULIN: 4.4 % (ref 2.9–4.9)
Albumin ELP: 57 % (ref 55.8–66.1)
Alpha-2-Globulin: 11.2 % (ref 7.1–11.8)
Beta 2: 4.5 % (ref 3.2–6.5)
Beta Globulin: 6.4 % (ref 4.7–7.2)
GAMMA GLOBULIN: 16.5 % (ref 11.1–18.8)
M-Spike, %: 0.59 g/dL
TOTAL PROTEIN, SERUM ELECTROPHOR: 6.3 g/dL (ref 6.0–8.3)

## 2014-01-16 LAB — IFE INTERPRETATION

## 2014-01-16 LAB — IGG, IGA, IGM
IgA: 77 mg/dL (ref 68–379)
IgG (Immunoglobin G), Serum: 1070 mg/dL (ref 650–1600)
IgM, Serum: 30 mg/dL — ABNORMAL LOW (ref 41–251)

## 2014-01-16 LAB — IGG: IgG (Immunoglobin G), Serum: 1070 mg/dL (ref 650–1600)

## 2014-02-07 ENCOUNTER — Ambulatory Visit (HOSPITAL_BASED_OUTPATIENT_CLINIC_OR_DEPARTMENT_OTHER): Payer: Medicare HMO | Admitting: Nurse Practitioner

## 2014-02-07 ENCOUNTER — Telehealth: Payer: Self-pay | Admitting: *Deleted

## 2014-02-07 ENCOUNTER — Other Ambulatory Visit: Payer: Self-pay | Admitting: Nurse Practitioner

## 2014-02-07 ENCOUNTER — Ambulatory Visit (HOSPITAL_COMMUNITY)
Admission: RE | Admit: 2014-02-07 | Discharge: 2014-02-07 | Disposition: A | Discharge status: Home / Self Care | Payer: Medicare HMO | Source: Ambulatory Visit | Attending: Nurse Practitioner | Admitting: Nurse Practitioner

## 2014-02-07 ENCOUNTER — Other Ambulatory Visit: Payer: Self-pay | Admitting: *Deleted

## 2014-02-07 ENCOUNTER — Other Ambulatory Visit: Payer: Self-pay

## 2014-02-07 ENCOUNTER — Other Ambulatory Visit (HOSPITAL_BASED_OUTPATIENT_CLINIC_OR_DEPARTMENT_OTHER): Payer: Medicare HMO

## 2014-02-07 VITALS — BP 134/61 | HR 92 | Temp 97.5°F | Resp 20

## 2014-02-07 DIAGNOSIS — E46 Unspecified protein-calorie malnutrition: Secondary | ICD-10-CM

## 2014-02-07 DIAGNOSIS — E8809 Other disorders of plasma-protein metabolism, not elsewhere classified: Secondary | ICD-10-CM

## 2014-02-07 DIAGNOSIS — I5030 Unspecified diastolic (congestive) heart failure: Secondary | ICD-10-CM

## 2014-02-07 DIAGNOSIS — R609 Edema, unspecified: Secondary | ICD-10-CM

## 2014-02-07 DIAGNOSIS — N289 Disorder of kidney and ureter, unspecified: Secondary | ICD-10-CM

## 2014-02-07 DIAGNOSIS — M25519 Pain in unspecified shoulder: Secondary | ICD-10-CM

## 2014-02-07 DIAGNOSIS — R079 Chest pain, unspecified: Secondary | ICD-10-CM

## 2014-02-07 DIAGNOSIS — R0602 Shortness of breath: Secondary | ICD-10-CM

## 2014-02-07 DIAGNOSIS — R0609 Other forms of dyspnea: Secondary | ICD-10-CM

## 2014-02-07 DIAGNOSIS — M25512 Pain in left shoulder: Secondary | ICD-10-CM

## 2014-02-07 DIAGNOSIS — R0989 Other specified symptoms and signs involving the circulatory and respiratory systems: Secondary | ICD-10-CM | POA: Diagnosis present

## 2014-02-07 DIAGNOSIS — R5383 Other fatigue: Secondary | ICD-10-CM

## 2014-02-07 DIAGNOSIS — R5381 Other malaise: Secondary | ICD-10-CM

## 2014-02-07 DIAGNOSIS — R600 Localized edema: Secondary | ICD-10-CM

## 2014-02-07 DIAGNOSIS — J984 Other disorders of lung: Secondary | ICD-10-CM | POA: Diagnosis not present

## 2014-02-07 DIAGNOSIS — C9000 Multiple myeloma not having achieved remission: Secondary | ICD-10-CM

## 2014-02-07 DIAGNOSIS — M25511 Pain in right shoulder: Secondary | ICD-10-CM

## 2014-02-07 LAB — CBC WITH DIFFERENTIAL/PLATELET
BASO%: 0.4 % (ref 0.0–2.0)
Basophils Absolute: 0 10*3/uL (ref 0.0–0.1)
EOS ABS: 0.1 10*3/uL (ref 0.0–0.5)
EOS%: 4.4 % (ref 0.0–7.0)
HEMATOCRIT: 30.8 % — AB (ref 38.4–49.9)
HGB: 11.2 g/dL — ABNORMAL LOW (ref 13.0–17.1)
LYMPH%: 40.9 % (ref 14.0–49.0)
MCH: 29.4 pg (ref 27.2–33.4)
MCHC: 36.4 g/dL — ABNORMAL HIGH (ref 32.0–36.0)
MCV: 80.8 fL (ref 79.3–98.0)
MONO#: 0.4 10*3/uL (ref 0.1–0.9)
MONO%: 13.5 % (ref 0.0–14.0)
NEUT%: 40.8 % (ref 39.0–75.0)
NEUTROS ABS: 1.1 10*3/uL — AB (ref 1.5–6.5)
NRBC: 0 % (ref 0–0)
Platelets: 62 10*3/uL — ABNORMAL LOW (ref 140–400)
RBC: 3.81 10*6/uL — AB (ref 4.20–5.82)
RDW: 18.7 % — ABNORMAL HIGH (ref 11.0–14.6)
WBC: 2.7 10*3/uL — ABNORMAL LOW (ref 4.0–10.3)
lymph#: 1.1 10*3/uL (ref 0.9–3.3)

## 2014-02-07 LAB — PRO B NATRIURETIC PEPTIDE: Pro B Natriuretic peptide (BNP): 14 pg/mL (ref <451)

## 2014-02-07 LAB — COMPREHENSIVE METABOLIC PANEL (CC13)
ALT: 10 U/L (ref 0–55)
ANION GAP: 8 meq/L (ref 3–11)
AST: 24 U/L (ref 5–34)
Albumin: 3.3 g/dL — ABNORMAL LOW (ref 3.5–5.0)
Alkaline Phosphatase: 51 U/L (ref 40–150)
BUN: 10.1 mg/dL (ref 7.0–26.0)
CALCIUM: 8.9 mg/dL (ref 8.4–10.4)
CHLORIDE: 108 meq/L (ref 98–109)
CO2: 23 mEq/L (ref 22–29)
CREATININE: 1 mg/dL (ref 0.7–1.3)
GLUCOSE: 184 mg/dL — AB (ref 70–140)
Potassium: 3.8 mEq/L (ref 3.5–5.1)
Sodium: 139 mEq/L (ref 136–145)
Total Bilirubin: 0.73 mg/dL (ref 0.20–1.20)
Total Protein: 6.5 g/dL (ref 6.4–8.3)

## 2014-02-07 LAB — TECHNOLOGIST REVIEW

## 2014-02-07 NOTE — Telephone Encounter (Signed)
   Provider input needed: Pain & swelling in feet   Reason for call: Walk in:  Pt states body pain, arms, shoulders, legs, feet ache & request medical help now.  Pt has multiple c/o's & has also had some chest pain.  He has an appt with his heart MD 02/22/14.  Musculoskeletal:positive for body pain, arms, shoulders, legs, feet    ALLERGIES:  is allergic to penicillins.  Patient last received chemotherapy/ treatment on 12/27/13-velcade  Patient was last seen in the office on 01/10/14 by Ned Card NP  Next appt is 03/07/14 with Dr Benay Spice Is patient having fevers greater than 100.5?  no   Is patient having uncontrolled pain, or new pain? no   Is patient having new back pain that changes with position (worsens or eases when laying down?)  no   Is patient able to eat and drink? no    Is patient able to pass stool without difficulty?   no     Is patient having uncontrolled nausea?  no    Pt walked in for lab appt calls 02/07/2014 with complaint of  Musculoskeletal:positive for body pain, arms, shoulders, legs, feet   Summary Based on the above information advised patient to wait in the lobby after labs drawn & will see Leland Johns NP   Jesse Fall  02/07/2014, 11:27 AM   Background Info  Aime Meloche   DOB: 03/24/1936   MR#: [id number]   CSN#   [id number] 02/07/2014

## 2014-02-08 ENCOUNTER — Telehealth: Payer: Self-pay

## 2014-02-08 ENCOUNTER — Encounter: Payer: Self-pay | Admitting: Nurse Practitioner

## 2014-02-08 DIAGNOSIS — R079 Chest pain, unspecified: Secondary | ICD-10-CM | POA: Insufficient documentation

## 2014-02-08 DIAGNOSIS — E46 Unspecified protein-calorie malnutrition: Secondary | ICD-10-CM | POA: Insufficient documentation

## 2014-02-08 DIAGNOSIS — M25519 Pain in unspecified shoulder: Secondary | ICD-10-CM | POA: Insufficient documentation

## 2014-02-08 DIAGNOSIS — E8809 Other disorders of plasma-protein metabolism, not elsewhere classified: Secondary | ICD-10-CM | POA: Insufficient documentation

## 2014-02-08 DIAGNOSIS — R609 Edema, unspecified: Secondary | ICD-10-CM | POA: Insufficient documentation

## 2014-02-08 DIAGNOSIS — R5383 Other fatigue: Secondary | ICD-10-CM | POA: Insufficient documentation

## 2014-02-08 NOTE — Telephone Encounter (Signed)
No answer/voicemailbox not set up

## 2014-02-08 NOTE — Assessment & Plan Note (Signed)
Patient is complaining of some intermittent, mild generalized chest pain.  He states it feels more like a pressure in his chest versus an actual chest pain.  He denies any chest pain whatsoever at this time.  EKG obtained today at revealed a normal sinus rhythm with a rate of 83.  QTC was within normal limits at 460.  Patient has an appointment for followup at his cardiologist on 02/22/2014.  I encouraged patient to keep this appointment for further cardiology evaluation.

## 2014-02-08 NOTE — Assessment & Plan Note (Signed)
Albumin is only slightly low at 3.3.  Patient bilateral ankle edema and may very well be related to his low albumin level.  Patient was encouraged to push protein as much as possible.

## 2014-02-08 NOTE — Assessment & Plan Note (Signed)
Patient continues to complain of chronic issues with fatigue.  Patient was encouraged to remain as active as possible daily basis.

## 2014-02-08 NOTE — Assessment & Plan Note (Signed)
Patient is complaining of bilateral shoulder pain; with his right shoulder slightly worse than his left.  Patient has full range of motion with his left shoulder.  Patient has slightly decreased range of motion with his right shoulder; indicating that this is a muscular problem.  Patient was advised to try ibuprofen intermittently to see if this helps.  May need to consider a orthopedic referral if pain continues.

## 2014-02-08 NOTE — Telephone Encounter (Signed)
No messaging avail

## 2014-02-08 NOTE — Assessment & Plan Note (Signed)
Patient continues with chemotherapy-induced pancytopenia.  Patient continues neutropenic; and his platelet count has not recovered and continues at 62.  Hemoglobin is stable at present.  Will continue to Velcade and get a break from all treatment for the time being.

## 2014-02-08 NOTE — Assessment & Plan Note (Signed)
Patient is noted to have some chronic bilateral ankle edema.  BNP was within normal limits today.  There was no shortness of breath on exam today.  Edema may be related to hypoalbuminemia.

## 2014-02-08 NOTE — Progress Notes (Signed)
SYMPTOM MANAGEMENT CLINIC    HPI: [name] 78 y.o. male diagnosed with multiple myeloma.  Most recently undergoing Velcade therapy.  Currently Velcade is on hold due to recurrent issues with pancytopenia.  Patient called the cancer Center today requesting an urgent care visit.  Patient is complaining chronic issues with fatigue.  He states that he has noted some vague chest discomfort and occasional shortness of breath with exertion.  He is also complaining of bilateral shoulder pain.  He denies any known injury or trauma to the shoulders.  He denies any recent fevers or chills.   HPI  CURRENT THERAPY: Upcoming Treatment Dates - MYELOMA SALVAGE Bortezomib SQ q7d Days with orders from any treatment category:  01/10/2014      SCHEDULING COMMUNICATION      dexamethasone (DECADRON) tablet 20 mg      ondansetron (ZOFRAN) tablet 8 mg      bortezomib SQ (VELCADE) chemo injection 2.25 mg      TREATMENT CONDITIONS 01/17/2014      SCHEDULING COMMUNICATION      dexamethasone (DECADRON) tablet 20 mg      ondansetron (ZOFRAN) tablet 8 mg      bortezomib SQ (VELCADE) chemo injection 2.25 mg      TREATMENT CONDITIONS    Review of Systems  Constitutional: Positive for malaise/fatigue. Negative for fever and chills.  HENT: Negative.   Eyes: Negative.   Respiratory: Positive for shortness of breath. Negative for cough.   Cardiovascular: Positive for chest pain and leg swelling. Negative for orthopnea.  Gastrointestinal: Negative.   Genitourinary: Negative.   Musculoskeletal:       Bilat shoulder pain.   Skin: Negative.   Neurological: Negative.   Endo/Heme/Allergies: Negative.   Psychiatric/Behavioral: Negative.   All other systems reviewed and are negative.   Past Medical History  Diagnosis Date  . GERD (gastroesophageal reflux disease)   . Essential hypertension, benign   . BPH (benign prostatic hyperplasia)   . Lumbar herniated disc     L4-5  . Hyperlipidemia   . Sleep apnea    . Multiple myeloma 02/19/2008  . Arthritis   . Diverticulosis   . Peptic ulcer disease   . Tubular adenoma 02/16/2008    Dr. Silvano Rusk    Past Surgical History  Procedure Laterality Date  . Neck surgery    . Lumbar laminectomy/decompression microdiscectomy  11/01/2011    Procedure: LUMBAR LAMINECTOMY/DECOMPRESSION MICRODISCECTOMY 2 LEVELS;  Surgeon: Charlie Pitter, MD;  Location: [facility];  Service: Neurosurgery;  Laterality: Right;  Lumbar Laminectomy/Microdiscectomy Decompression Lumbar Three-Four, Lumbar Four-Five   . Colonoscopy    . Hemorrhoid banding  2014    has Obstructive sleep apnea; Insomnia with sleep apnea; Multiple myeloma; Lumbar disc herniation with radiculopathy; Renal insufficiency; Constipation; Personal history of colonic adenoma; Essential hypertension, benign; Mixed hyperlipidemia; Other malaise and fatigue; Hemorrhoids, internal, with bleeding and grade 2 prolapse; Seasonal allergic rhinitis; Recurrent isolated sleep paralysis; Dyspnea on exertion; Chest congestion; Fever, unspecified; Low oxygen saturation; Wheezing; Hypoxia; SOB (shortness of breath); Pneumonia; Diastolic CHF; GERD (gastroesophageal reflux disease); Fatigue; Chest pain; Shoulder pain; Hypoalbuminemia due to protein-calorie malnutrition; and Peripheral edema on his problem list.     is allergic to penicillins.    Medication List       This list is accurate as of: 02/07/14 11:59 PM.  Always use your most recent med list.               acyclovir 400 MG  tablet  Commonly known as:  ZOVIRAX  TAKE 1 TABLET (400 MG TOTAL) BY MOUTH 2 (TWO) TIMES DAILY.     albuterol 108 (90 BASE) MCG/ACT inhaler  Commonly known as:  PROVENTIL HFA  Inhale 2 puffs into the lungs every 6 (six) hours as needed for wheezing or shortness of breath.     AMBULATORY NON FORMULARY MEDICATION  O2 @@ 2LMP whenever active     amLODipine 2.5 MG tablet  Commonly known as:  NORVASC  Take 1 tablet (2.5 mg total) by mouth  daily.     aspirin 325 MG EC tablet  Take 1 tablet (325 mg total) by mouth 2 (two) times daily. Do not take again until March 20, 2013     atorvastatin 20 MG tablet  Commonly known as:  LIPITOR  TAKE 1 TABLET (20 MG TOTAL) BY MOUTH DAILY.     clonazePAM 0.5 MG tablet  Commonly known as:  KLONOPIN  TAKE 1-2 TABLETS THIRTY MINUTES BEFORE BEDTIME AS NEEDED     cromolyn 5.2 MG/ACT nasal spray  Commonly known as:  NASALCROM  Place 1 spray into both nostrils 4 (four) times daily.     Cyclophosphamide 50 MG Caps  Take every Thursday     dexamethasone 4 MG tablet  Commonly known as:  DECADRON  Take 20 mg by mouth. Take 5 tablets on Thursdays when you are not getting chemotherapy.     fluticasone 50 MCG/ACT nasal spray  Commonly known as:  FLONASE  Place 2 sprays into both nostrils daily.     lactulose 10 GM/15ML solution  Commonly known as:  CHRONULAC  Take 10 g by mouth 2 (two) times daily as needed.     loratadine 10 MG tablet  Commonly known as:  CLARITIN  Take 1 tablet (10 mg total) by mouth daily.     NITROSTAT 0.4 MG SL tablet  Generic drug:  nitroGLYCERIN  Place 0.4 mg under the tongue as needed for chest pain.     pantoprazole 40 MG tablet  Commonly known as:  PROTONIX  Take 1 tablet (40 mg total) by mouth daily.     polyethylene glycol powder powder  Commonly known as:  GLYCOLAX/MIRALAX  TAKE 1 TO 2 DOSES DAILY AS NEEDED     potassium chloride SA 20 MEQ tablet  Commonly known as:  KLOR-CON M20  Take 1 tablet (20 mEq total) by mouth daily.     PRESCRIPTION MEDICATION  Inject as directed every 7 (seven) days. Velcade 39m injection q7d on Thursdays, skips every 4th week.     prochlorperazine 10 MG tablet  Commonly known as:  COMPAZINE  TAKE 1 TABLET BY MOUTH EVERY SIX HOURS AS NEEDED     sulfacetamide 10 % ophthalmic solution  Commonly known as:  BLEPH-10  Place 1 drop into both eyes every 3 (three) hours.     tamsulosin 0.4 MG Caps capsule  Commonly  known as:  FLOMAX  Take 1 capsule (0.4 mg total) by mouth 2 (two) times daily. With a  Meal.         PHYSICAL EXAMINATION  Blood pressure 134/61, pulse 92, temperature 97.5 F (36.4 C), temperature source Oral, resp. rate 20, SpO2 99.00%.  Physical Exam  Nursing note and vitals reviewed. Constitutional: He is oriented to person, place, and time and well-developed, well-nourished, and in no distress.  HENT:  Head: Normocephalic and atraumatic.  Eyes: Conjunctivae are normal. Pupils are equal, round, and reactive to light.  Neck: Normal  range of motion. Neck supple.  Cardiovascular: Normal rate, regular rhythm, normal heart sounds and intact distal pulses.  Exam reveals no friction rub.   No murmur heard. Pulmonary/Chest: Effort normal and breath sounds normal. No respiratory distress. He has no wheezes.  Abdominal: Soft. Bowel sounds are normal.  Musculoskeletal: He exhibits edema. He exhibits no tenderness.  bilat ankle edema 2+.  Full ROM with left shoulder; but decreased ROM to right shoulder. Nontender with palp. No evidence of trauma or injury to right shoulder.   Neurological: He is alert and oriented to person, place, and time. Gait normal.  Skin: Skin is dry.  Psychiatric: Affect normal.    LABORATORY DATA:. CBC  Lab Results  Component Value Date   WBC 2.7* 02/07/2014   RBC 3.81* 02/07/2014   HGB 11.2* 02/07/2014   HCT 30.8* 02/07/2014   PLT 62* 02/07/2014   MCV 80.8 02/07/2014   MCH 29.4 02/07/2014   MCHC 36.4* 02/07/2014   RDW 18.7* 02/07/2014   LYMPHSABS 1.1 02/07/2014   MONOABS 0.4 02/07/2014   EOSABS 0.1 02/07/2014   BASOSABS 0.0 02/07/2014     CMET  Lab Results  Component Value Date   NA 139 02/07/2014   K 3.8 02/07/2014   CL 101 08/08/2013   CO2 23 02/07/2014   GLUCOSE 184* 02/07/2014   BUN 10.1 02/07/2014   CREATININE 1.0 02/07/2014   CALCIUM 8.9 02/07/2014   PROT 6.5 02/07/2014   ALBUMIN 3.3* 02/07/2014   AST 24 02/07/2014   ALT 10 02/07/2014   ALKPHOS 51  02/07/2014   BILITOT 0.73 02/07/2014   GFRNONAA 85* 08/08/2013   GFRAA >90 08/08/2013    RADIOGRAPHIC STUDIES: PACS Images    Show images for DG Chest 2 View         Study Result    CLINICAL DATA: Dyspnea on exertion  EXAM:  CHEST 2 VIEW  COMPARISON: 09/18/2013  FINDINGS:  Cardiac shadow is within normal limits. The lungs are well aerated  and demonstrates some very minimal bibasilar scarring. No focal  infiltrate or sizable effusion is seen. No bony abnormality is  noted.  IMPRESSION:  No acute abnormality noted.  Electronically Signed  By: Inez Catalina M.D.  On: 02/07/2014 14:03     ASSESSMENT/PLAN:    Multiple myeloma  Assessment & Plan Patient continues with chemotherapy-induced pancytopenia.  Patient continues neutropenic; and his platelet count has not recovered and continues at 62.  Hemoglobin is stable at present.  Will continue to Velcade and get a break from all treatment for the time being.   Dyspnea on exertion  Assessment & Plan Patient continues to complain of some mild shortness of breath with exertion.  Chest x-ray obtained revealed no pneumonia; and no significant abnormal findings.  BNP was normal.  No specific shortness of breath on exam today.  Patient's chronic issues with dyspnea could be related to his disease process; or due to deconditioning related to past Velcade treatment.   Fatigue  Assessment & Plan Patient continues to complain of chronic issues with fatigue.  Patient was encouraged to remain as active as possible daily basis.   Chest pain  Assessment & Plan Patient is complaining of some intermittent, mild generalized chest pain.  He states it feels more like a pressure in his chest versus an actual chest pain.  He denies any chest pain whatsoever at this time.  EKG obtained today at revealed a normal sinus rhythm with a rate of 83.  QTC was within normal  limits at 460.  Patient has an appointment for followup at his cardiologist on  02/22/2014.  I encouraged patient to keep this appointment for further cardiology evaluation.   Shoulder pain  Assessment & Plan Patient is complaining of bilateral shoulder pain; with his right shoulder slightly worse than his left.  Patient has full range of motion with his left shoulder.  Patient has slightly decreased range of motion with his right shoulder; indicating that this is a muscular problem.  Patient was advised to try ibuprofen intermittently to see if this helps.  May need to consider a orthopedic referral if pain continues.   Hypoalbuminemia due to protein-calorie malnutrition  Assessment & Plan Albumin is only slightly low at 3.3.  Patient bilateral ankle edema and may very well be related to his low albumin level.  Patient was encouraged to push protein as much as possible.   Peripheral edema  Assessment & Plan Patient is noted to have some chronic bilateral ankle edema.  BNP was within normal limits today.  There was no shortness of breath on exam today.  Edema may be related to hypoalbuminemia.    Patient stated understanding of all instructions; and was in agreement with this plan of care. The patient knows to call the clinic with any problems, questions or concerns.   Review/collaboration with Dr. Benay Spice regarding all aspects of patient's visit today.   Total time spent with patient was 40 minutes;  with greater than 75 percent of that time spent in face to face counseling regarding his symptoms, review of lab results and chest x-ray results, and coordination of care and follow up.  Disclaimer: This note was dictated with voice recognition software. Similar sounding words can inadvertently be transcribed and may not be corrected upon review.   Drue Second, NP 02/08/2014

## 2014-02-08 NOTE — Telephone Encounter (Signed)
Let pt wife know that CXR was negative.  She voiced understanding.

## 2014-02-08 NOTE — Assessment & Plan Note (Signed)
Patient continues to complain of some mild shortness of breath with exertion.  Chest x-ray obtained revealed no pneumonia; and no significant abnormal findings.  BNP was normal.  No specific shortness of breath on exam today.  Patient's chronic issues with dyspnea could be related to his disease process; or due to deconditioning related to past Velcade treatment.

## 2014-02-12 ENCOUNTER — Other Ambulatory Visit: Payer: Self-pay | Admitting: *Deleted

## 2014-02-12 DIAGNOSIS — C9000 Multiple myeloma not having achieved remission: Secondary | ICD-10-CM

## 2014-02-12 NOTE — Telephone Encounter (Signed)
Last ov 6/15. Last K+ 3.8 on 02/07/14.

## 2014-02-13 MED ORDER — POTASSIUM CHLORIDE CRYS ER 20 MEQ PO TBCR: 20.0000 meq | EXTENDED_RELEASE_TABLET | Freq: Every day | ORAL | Status: DC

## 2014-02-18 ENCOUNTER — Ambulatory Visit (INDEPENDENT_AMBULATORY_CARE_PROVIDER_SITE_OTHER): Payer: Medicare HMO | Admitting: Family

## 2014-02-18 ENCOUNTER — Encounter: Payer: Self-pay | Admitting: Family

## 2014-02-18 VITALS — BP 125/67 | HR 109 | Temp 97.7°F | Ht 72.0 in | Wt 272.8 lb

## 2014-02-18 DIAGNOSIS — R609 Edema, unspecified: Secondary | ICD-10-CM

## 2014-02-18 DIAGNOSIS — Z23 Encounter for immunization: Secondary | ICD-10-CM

## 2014-02-18 MED ORDER — FUROSEMIDE 20 MG PO TABS: 20.0000 mg | ORAL_TABLET | Freq: Every day | ORAL | Status: DC

## 2014-02-18 NOTE — Progress Notes (Signed)
   Subjective:    Patient ID: [name], male    DOB: 12/28/1935, 78 y.o.   MRN: [id number]  HPI Pt presents to the office for lower peripheral edema that started 2-3 months. However, about a week ago his lower left leg started turning darker in color on his ankles. Pt states his legs feels tight and is painful when he twists his foot or moves his legs. States he is SOB and it seems to be getting worse. Pt has an appointment with a Cardiologists on Friday.   Pt is currently being treated for Multiple Myeloma.    Review of Systems  Constitutional: Negative.   HENT: Negative.   Respiratory: Positive for shortness of breath.   Cardiovascular: Positive for leg swelling.  Gastrointestinal: Negative.   Endocrine: Negative.   Genitourinary: Negative.   Musculoskeletal: Negative.   Neurological: Negative.   Hematological: Negative.   Psychiatric/Behavioral: Negative.   All other systems reviewed and are negative.      Objective:   Physical Exam  Vitals reviewed. Constitutional: He is oriented to person, place, and time. He appears well-developed and well-nourished. No distress.  HENT:  Head: Normocephalic.  Right Ear: External ear normal.  Left Ear: External ear normal.  Mouth/Throat: Oropharynx is clear and moist.  Eyes: Pupils are equal, round, and reactive to light. Right eye exhibits no discharge. Left eye exhibits no discharge.  Neck: Normal range of motion. Neck supple. No thyromegaly present.  Cardiovascular: Normal rate, regular rhythm, normal heart sounds and intact distal pulses.   No murmur heard. Pulmonary/Chest: Effort normal and breath sounds normal. No respiratory distress. He has no wheezes.  Abdominal: Soft. Bowel sounds are normal. He exhibits no distension. There is no tenderness.  Musculoskeletal: Normal range of motion. He exhibits edema and tenderness.  Neurological: He is alert and oriented to person, place, and time. He has normal reflexes. No cranial nerve  deficit.  Skin: Skin is warm and dry. No rash noted. No erythema.  Psychiatric: He has a normal mood and affect. His behavior is normal. Judgment and thought content normal.    BP 125/67  Pulse 109  Temp(Src) 97.7 F (36.5 C) (Oral)  Ht 6' (1.829 m)  Wt 272 lb 12.8 oz (123.741 kg)  BMI 36.99 kg/m2       Assessment & Plan:  1. Peripheral edema -Keep feet elevated - furosemide (LASIX) 20 MG tablet; Take 1 tablet (20 mg total) by mouth daily.  Dispense: 90 tablet; Refill: 3 - CMP14+EGFR - BMP8+EGFR -RTO in 3 weeks  Evelina Dun, FNP

## 2014-02-18 NOTE — Patient Instructions (Signed)
Peripheral Edema °You have swelling in your legs (peripheral edema). This swelling is due to excess accumulation of salt and water in your body. Edema may be a sign of heart, kidney or liver disease, or a side effect of a medication. It may also be due to problems in the leg veins. Elevating your legs and using special support stockings may be very helpful, if the cause of the swelling is due to poor venous circulation. Avoid long periods of standing, whatever the cause. °Treatment of edema depends on identifying the cause. Chips, pretzels, pickles and other salty foods should be avoided. Restricting salt in your diet is almost always needed. Water pills (diuretics) are often used to remove the excess salt and water from your body via urine. These medicines prevent the kidney from reabsorbing sodium. This increases urine flow. °Diuretic treatment may also result in lowering of potassium levels in your body. Potassium supplements may be needed if you have to use diuretics daily. Daily weights can help you keep track of your progress in clearing your edema. You should call your caregiver for follow up care as recommended. °SEEK IMMEDIATE MEDICAL CARE IF:  °· You have increased swelling, pain, redness, or heat in your legs. °· You develop shortness of breath, especially when lying down. °· You develop chest or abdominal pain, weakness, or fainting. °· You have a fever. °Document Released: 08/05/2004 Document Revised: 09/20/2011 Document Reviewed: 07/16/2009 °ExitCare® Patient Information ©2015 [employer]. This information is not intended to replace advice given to you by your health care provider. Make sure you discuss any questions you have with your health care provider. ° °

## 2014-02-20 LAB — BRAIN NATRIURETIC PEPTIDE: BNP: 15.4 pg/mL (ref 0.0–100.0)

## 2014-02-20 LAB — CMP14+EGFR
ALT: 15 IU/L (ref 0–44)
AST: 31 IU/L (ref 0–40)
Albumin/Globulin Ratio: 1.4 (ref 1.1–2.5)
Albumin: 3.9 g/dL (ref 3.5–4.8)
Alkaline Phosphatase: 58 IU/L (ref 39–117)
BILIRUBIN TOTAL: 0.6 mg/dL (ref 0.0–1.2)
BUN / CREAT RATIO: 12 (ref 10–22)
BUN: 12 mg/dL (ref 8–27)
CO2: 22 mmol/L (ref 18–29)
Calcium: 9 mg/dL (ref 8.6–10.2)
Chloride: 102 mmol/L (ref 97–108)
Creatinine, Ser: 0.98 mg/dL (ref 0.76–1.27)
GFR calc Af Amer: 86 mL/min/{1.73_m2} (ref >59)
GFR, EST NON AFRICAN AMERICAN: 75 mL/min/{1.73_m2} (ref >59)
GLUCOSE: 123 mg/dL — AB (ref 65–99)
Globulin, Total: 2.7 g/dL (ref 1.5–4.5)
Potassium: 3.9 mmol/L (ref 3.5–5.2)
Sodium: 140 mmol/L (ref 134–144)
TOTAL PROTEIN: 6.6 g/dL (ref 6.0–8.5)

## 2014-02-20 LAB — SPECIMEN STATUS REPORT

## 2014-02-21 ENCOUNTER — Other Ambulatory Visit: Payer: Self-pay | Admitting: Family Medicine

## 2014-02-22 ENCOUNTER — Encounter: Payer: Self-pay | Admitting: Cardiovascular Disease

## 2014-02-22 ENCOUNTER — Encounter: Payer: Self-pay | Admitting: *Deleted

## 2014-02-22 ENCOUNTER — Telehealth: Payer: Self-pay | Admitting: Family Medicine

## 2014-02-22 ENCOUNTER — Other Ambulatory Visit: Payer: Self-pay | Admitting: Family

## 2014-02-22 ENCOUNTER — Ambulatory Visit (INDEPENDENT_AMBULATORY_CARE_PROVIDER_SITE_OTHER): Payer: Medicare HMO | Admitting: Cardiovascular Disease

## 2014-02-22 VITALS — BP 120/76 | HR 91 | Ht 74.0 in | Wt 255.0 lb

## 2014-02-22 DIAGNOSIS — M79609 Pain in unspecified limb: Secondary | ICD-10-CM

## 2014-02-22 DIAGNOSIS — E782 Mixed hyperlipidemia: Secondary | ICD-10-CM

## 2014-02-22 DIAGNOSIS — I2510 Atherosclerotic heart disease of native coronary artery without angina pectoris: Secondary | ICD-10-CM

## 2014-02-22 DIAGNOSIS — C9000 Multiple myeloma not having achieved remission: Secondary | ICD-10-CM

## 2014-02-22 DIAGNOSIS — I10 Essential (primary) hypertension: Secondary | ICD-10-CM

## 2014-02-22 DIAGNOSIS — R0602 Shortness of breath: Secondary | ICD-10-CM

## 2014-02-22 DIAGNOSIS — R609 Edema, unspecified: Secondary | ICD-10-CM

## 2014-02-22 DIAGNOSIS — R942 Abnormal results of pulmonary function studies: Secondary | ICD-10-CM

## 2014-02-22 DIAGNOSIS — R600 Localized edema: Secondary | ICD-10-CM

## 2014-02-22 DIAGNOSIS — M79661 Pain in right lower leg: Secondary | ICD-10-CM

## 2014-02-22 DIAGNOSIS — R079 Chest pain, unspecified: Secondary | ICD-10-CM

## 2014-02-22 DIAGNOSIS — M79662 Pain in left lower leg: Secondary | ICD-10-CM

## 2014-02-22 NOTE — Progress Notes (Signed)
Patient ID: [name], male   DOB: 1936/01/13, 78 y.o.   MRN: [id number]      SUBJECTIVE: The patient is a 78 yr old male with a history of presumed coronary artery disease with a prior nuclear myocardial perfusion study demonstrating some suspicion for prior inferior infarct. He also has a history of chronic dyspnea on exertion felt secondary to COPD as well as lower extremity edema and essential hypertension, sleep apnea, as well as multiple myeloma. He was recently evaluated by his primary care provider for increasing dyspnea and leg swelling and was prescribed Lasix and blood tests were ordered.  Prior cardiac testing results:  -Lexiscan Cardiolite, 11/13/2012: Low risk stress nuclear study. There is a fixed (looks worse at rest) medium-sized basal to mid inferior and inferoseptal perfusion defect. There is no evidence for ischemia. Suspect prior infarction. EF is preserved. LV Ejection Fraction: 58%. LV Wall Motion: Basal inferior hypokinesis   - Echocardiogram, 05/2013: Normal LV systolic function normal, EF 60-65%, mild LVH, grade I diastolic dysfunction.  PFTs in December 2014 demonstrated minimal obstructive airway disease with severely reduced DLCO, felt secondary to COPD given history of tobacco abuse.  Labs on 02/18/13: BNP 15, GFR 75 ml/min.  He continues to have bilateral leg swelling which has improved considerably with Lasix 20 mg daily. He continues to have dyspnea on exertion and feels increasingly fatigued over the past 2-3 months. He only gets chest tightness if he feels constipated.   Review of Systems: As per "subjective", otherwise negative.  Allergies  Allergen Reactions  . Penicillins Hives and Rash    Current Outpatient Prescriptions  Medication Sig Dispense Refill  . acyclovir (ZOVIRAX) 400 MG tablet TAKE 1 TABLET (400 MG TOTAL) BY MOUTH 2 (TWO) TIMES DAILY.  60 tablet  6  . albuterol (PROVENTIL HFA) 108 (90 BASE) MCG/ACT inhaler Inhale 2 puffs into the lungs  every 6 (six) hours as needed for wheezing or shortness of breath.  1 Inhaler  11  . AMBULATORY NON FORMULARY MEDICATION O2 @@ 2LMP whenever active      . amLODipine (NORVASC) 2.5 MG tablet Take 1 tablet (2.5 mg total) by mouth daily.  30 tablet  5  . aspirin 325 MG EC tablet Take 1 tablet (325 mg total) by mouth 2 (two) times daily. Do not take again until March 20, 2013      . atorvastatin (LIPITOR) 20 MG tablet TAKE 1 TABLET (20 MG TOTAL) BY MOUTH DAILY.  90 tablet  3  . clonazePAM (KLONOPIN) 0.5 MG tablet TAKE 1-2 TABLETS THIRTY MINUTES BEFORE BEDTIME AS NEEDED  30 tablet  1  . cromolyn (NASALCROM) 5.2 MG/ACT nasal spray Place 1 spray into both nostrils 4 (four) times daily.  26 mL  12  . ENSURE PLUS (ENSURE PLUS) LIQD Take 237 mLs by mouth.      . fluticasone (FLONASE) 50 MCG/ACT nasal spray Place 2 sprays into both nostrils daily.  48 g  0  . furosemide (LASIX) 20 MG tablet Take 1 tablet (20 mg total) by mouth daily.  90 tablet  3  . lactulose (CHRONULAC) 10 GM/15ML solution Take 10 g by mouth 2 (two) times daily as needed.      . loratadine (CLARITIN) 10 MG tablet Take 1 tablet (10 mg total) by mouth daily.  30 tablet  5  . NITROSTAT 0.4 MG SL tablet Place 0.4 mg under the tongue as needed for chest pain.       . pantoprazole (PROTONIX) 40  MG tablet Take 1 tablet (40 mg total) by mouth daily.  30 tablet  5  . polyethylene glycol powder (GLYCOLAX/MIRALAX) powder TAKE 1 TO 2 DOSES DAILY AS NEEDED  527 g  2  . potassium chloride SA (KLOR-CON M20) 20 MEQ tablet Take 1 tablet (20 mEq total) by mouth daily.  90 tablet  0  . prochlorperazine (COMPAZINE) 10 MG tablet TAKE 1 TABLET BY MOUTH EVERY SIX HOURS AS NEEDED  30 tablet  2  . sulfacetamide (BLEPH-10) 10 % ophthalmic solution Place 1 drop into both eyes every 3 (three) hours.  15 mL  0  . tamsulosin (FLOMAX) 0.4 MG CAPS capsule TAKE 1 CAPSULE (0.4 MG TOTAL) BY MOUTH 2 (TWO) TIMES DAILY. WITH A MEAL.  180 capsule  0   No current  facility-administered medications for this visit.    Past Medical History  Diagnosis Date  . GERD (gastroesophageal reflux disease)   . Essential hypertension, benign   . BPH (benign prostatic hyperplasia)   . Lumbar herniated disc     L4-5  . Hyperlipidemia   . Sleep apnea   . Multiple myeloma 02/19/2008  . Arthritis   . Diverticulosis   . Peptic ulcer disease   . Tubular adenoma 02/16/2008    Dr. Silvano Rusk    Past Surgical History  Procedure Laterality Date  . Neck surgery    . Lumbar laminectomy/decompression microdiscectomy  11/01/2011    Procedure: LUMBAR LAMINECTOMY/DECOMPRESSION MICRODISCECTOMY 2 LEVELS;  Surgeon: Charlie Pitter, MD;  Location: [facility];  Service: Neurosurgery;  Laterality: Right;  Lumbar Laminectomy/Microdiscectomy Decompression Lumbar Three-Four, Lumbar Four-Five   . Colonoscopy    . Hemorrhoid banding  2014    History   Social History  . Marital Status: Married    Spouse Name: N/A    Number of Children: 4  . Years of Education: N/A   Occupational History  . retired-disabled-Construction    Social History Main Topics  . Smoking status: Former Smoker -- 2.00 packs/day for 20 years    Types: Cigarettes    Quit date: 07/12/1990  . Smokeless tobacco: Never Used  . Alcohol Use: No  . Drug Use: No  . Sexual Activity: No   Other Topics Concern  . Not on file   Social History Narrative   Pt was adopted.   Married   Raises chickens    BP 120/76  Pulse 91  Wt 255 lbs  PHYSICAL EXAM General: NAD Neck: No JVD, no thyromegaly. Lungs: Diminished air entry but no wheezes or crackles. CV: Nondisplaced PMI.  Regular rate and rhythm, normal S1/S2, no S3/S4, no murmur. 1+ pitting pretibial and periankle edema with stasis dermatitis.   Abdomen: Soft, nontender, no hepatosplenomegaly, no distention.  Neurologic: Alert and oriented x 3.  Psych: Normal affect. Extremities: No clubbing or cyanosis.   ECG: reviewed and available in electronic  records.      ASSESSMENT AND PLAN: 1. Bilateral leg edema and DOE with fatigue: He has a history of mild diastolic dysfunction. Given the low BNP, his dyspnea is likely related to COPD and not heart failure. In order to rule out an ischemic etiology, I will obtain a Lexiscan Cardiolite stress test given the previous abnormalities noted in 11/2012, which were suspicious for CAD. I will also obtain bilateral lower extremity Dopplers as his multiple myeloma makes him hypercoagulable, and I want to rule out DVT. Continue Lasix 20 mg daily. CT angiography earlier this year did not demonstrate pulmonary embolism.  2. Essential HTN: Controlled on present therapy.  3. Presumed CAD: Will proceed with Lexiscan as noted above.  4. COPD: Has not been using inhalers. PFT results as noted above. Follows with pulmonary.  5. Sleep apnea: Using CPAP.  6. Multiple myeloma: Follows with hem-onc. Hgb on 7/30 was 11.2.  Dispo: f/u 3 weeks.  Kate Sable, M.D., F.A.C.C.

## 2014-02-22 NOTE — Patient Instructions (Signed)
Your physician recommends that you schedule a follow-up appointment in: 2-3 weeks. Your physician recommends that you continue on your current medications as directed. Please refer to the Current Medication list given to you today. Your physician recommends that you have a bilateral lower extremity arterial doppler. Your physician has requested that you have a lexiscan myoview. For further information please visit [email or web address]. Please follow instruction sheet, as given.

## 2014-02-22 NOTE — Telephone Encounter (Signed)
Message copied by Waverly Ferrari on Fri Feb 22, 2014 10:44 AM ------      Message from: [place], [place] A      Created: Fri Feb 22, 2014 10:06 AM       Kidney and liver function stable      BNP WNL- No CHF ------

## 2014-02-28 ENCOUNTER — Ambulatory Visit (HOSPITAL_COMMUNITY)
Admission: RE | Admit: 2014-02-28 | Discharge: 2014-02-28 | Disposition: A | Discharge status: Home / Self Care | Payer: Medicare HMO | Source: Ambulatory Visit | Attending: Cardiovascular Disease | Admitting: Cardiovascular Disease

## 2014-02-28 ENCOUNTER — Encounter (HOSPITAL_COMMUNITY)
Admission: RE | Admit: 2014-02-28 | Discharge: 2014-02-28 | Disposition: A | Discharge status: Home / Self Care | Payer: Medicare HMO | Source: Ambulatory Visit | Attending: Cardiovascular Disease | Admitting: Cardiovascular Disease

## 2014-02-28 ENCOUNTER — Encounter (HOSPITAL_COMMUNITY): Payer: Self-pay

## 2014-02-28 ENCOUNTER — Ambulatory Visit (HOSPITAL_COMMUNITY)
Admission: RE | Admit: 2014-02-28 | Discharge: 2014-02-28 | Disposition: A | Discharge status: Home / Self Care | Payer: Medicare HMO | Source: Ambulatory Visit | Attending: Family Medicine | Admitting: Family Medicine

## 2014-02-28 ENCOUNTER — Encounter: Payer: Self-pay | Admitting: Family Medicine

## 2014-02-28 DIAGNOSIS — J449 Chronic obstructive pulmonary disease, unspecified: Secondary | ICD-10-CM | POA: Insufficient documentation

## 2014-02-28 DIAGNOSIS — R0989 Other specified symptoms and signs involving the circulatory and respiratory systems: Secondary | ICD-10-CM | POA: Insufficient documentation

## 2014-02-28 DIAGNOSIS — R079 Chest pain, unspecified: Secondary | ICD-10-CM | POA: Diagnosis present

## 2014-02-28 DIAGNOSIS — R9439 Abnormal result of other cardiovascular function study: Secondary | ICD-10-CM | POA: Insufficient documentation

## 2014-02-28 DIAGNOSIS — R609 Edema, unspecified: Secondary | ICD-10-CM | POA: Diagnosis not present

## 2014-02-28 DIAGNOSIS — E785 Hyperlipidemia, unspecified: Secondary | ICD-10-CM | POA: Insufficient documentation

## 2014-02-28 DIAGNOSIS — R0609 Other forms of dyspnea: Secondary | ICD-10-CM | POA: Diagnosis not present

## 2014-02-28 DIAGNOSIS — C9000 Multiple myeloma not having achieved remission: Secondary | ICD-10-CM | POA: Insufficient documentation

## 2014-02-28 DIAGNOSIS — R0602 Shortness of breath: Secondary | ICD-10-CM

## 2014-02-28 DIAGNOSIS — J4489 Other specified chronic obstructive pulmonary disease: Secondary | ICD-10-CM | POA: Insufficient documentation

## 2014-02-28 DIAGNOSIS — R600 Localized edema: Secondary | ICD-10-CM

## 2014-02-28 DIAGNOSIS — I10 Essential (primary) hypertension: Secondary | ICD-10-CM | POA: Insufficient documentation

## 2014-02-28 DIAGNOSIS — I2510 Atherosclerotic heart disease of native coronary artery without angina pectoris: Secondary | ICD-10-CM

## 2014-02-28 MED ORDER — TECHNETIUM TC 99M SESTAMIBI GENERIC - CARDIOLITE
30.0000 | Freq: Once | INTRAVENOUS | Status: AC | PRN
  Administered 2014-02-28: 29 via INTRAVENOUS

## 2014-02-28 MED ORDER — TECHNETIUM TC 99M SESTAMIBI - CARDIOLITE
10.0000 | Freq: Once | INTRAVENOUS | Status: AC | PRN
  Administered 2014-02-28: 10 via INTRAVENOUS

## 2014-02-28 MED ORDER — REGADENOSON 0.4 MG/5ML IV SOLN
INTRAVENOUS | Status: AC
  Administered 2014-02-28: 0.4 mg via INTRAVENOUS
  Filled 2014-02-28: qty 5

## 2014-02-28 MED ORDER — SODIUM CHLORIDE 0.9 % IJ SOLN
10.0000 mL | INTRAMUSCULAR | Status: DC | PRN
  Administered 2014-02-28: 10 mL via INTRAVENOUS

## 2014-02-28 MED ORDER — SODIUM CHLORIDE 0.9 % IJ SOLN
INTRAMUSCULAR | Status: AC
  Administered 2014-02-28: 10 mL via INTRAVENOUS
  Filled 2014-02-28: qty 10

## 2014-02-28 MED ORDER — REGADENOSON 0.4 MG/5ML IV SOLN
0.4000 mg | Freq: Once | INTRAVENOUS | Status: AC | PRN
  Administered 2014-02-28: 0.4 mg via INTRAVENOUS

## 2014-02-28 NOTE — Progress Notes (Signed)
Stress Lab Nurses Notes - [name]  [name] 02/28/2014 Reason for doing test: Chest Pain, Dyspnea and COPD Type of test: [name] Nurse performing test: Gerrit Halls, RN Nuclear Medicine Tech: Melburn Hake Echo Tech: Not Applicable MD performing test: S. McDowell/D.Dunn PA Family MD: Laurance Flatten Test explained and consent signed: Yes.   IV started: Saline lock flushed, No redness or edema and Saline lock started in radiology Symptoms: stomach pain Treatment/Intervention: None Reason test stopped: protocol completed After recovery IV was: Discontinued via X-ray tech and No redness or edema Patient to return to Nuc. Med at : 12:30 Patient discharged: Home Patient's Condition upon discharge was: stable Comments: During test BP 123/69 & HR 100.  Recovery BP 123/60 & HR 88.  Symptoms resolved in recovery.  [name] T

## 2014-03-04 ENCOUNTER — Ambulatory Visit: Payer: Self-pay | Admitting: Internal Medicine

## 2014-03-05 ENCOUNTER — Telehealth: Payer: Self-pay | Admitting: *Deleted

## 2014-03-05 NOTE — Telephone Encounter (Signed)
Notes Recorded by Laurine Blazer, LPN on 0/34/0352 at 48:18 AM Wife Langley Gauss) notified. Reminded to keep already scheduled follow up for 03/20/2014 with Dr. Bronson Ing. ------

## 2014-03-05 NOTE — Telephone Encounter (Signed)
Message copied by Laurine Blazer on Tue Mar 05, 2014 10:40 AM ------      Message from: Vernon F      Created: Mon Mar 04, 2014  6:59 PM       Stress test without any evidence of new blockages. Keep follow up with Dr Kathryne Gin MD ------

## 2014-03-07 ENCOUNTER — Telehealth: Payer: Self-pay | Admitting: Oncology

## 2014-03-07 ENCOUNTER — Ambulatory Visit (HOSPITAL_BASED_OUTPATIENT_CLINIC_OR_DEPARTMENT_OTHER): Payer: Medicare HMO | Admitting: Oncology

## 2014-03-07 ENCOUNTER — Other Ambulatory Visit (HOSPITAL_BASED_OUTPATIENT_CLINIC_OR_DEPARTMENT_OTHER): Payer: Medicare HMO

## 2014-03-07 VITALS — BP 137/66 | HR 87 | Temp 97.7°F | Resp 20 | Ht 74.0 in | Wt 265.9 lb

## 2014-03-07 DIAGNOSIS — D61818 Other pancytopenia: Secondary | ICD-10-CM

## 2014-03-07 DIAGNOSIS — C9000 Multiple myeloma not having achieved remission: Secondary | ICD-10-CM

## 2014-03-07 LAB — CBC WITH DIFFERENTIAL/PLATELET
BASO%: 0.4 % (ref 0.0–2.0)
Basophils Absolute: 0 10*3/uL (ref 0.0–0.1)
EOS ABS: 0.1 10*3/uL (ref 0.0–0.5)
EOS%: 4.3 % (ref 0.0–7.0)
HCT: 29 % — ABNORMAL LOW (ref 38.4–49.9)
HGB: 10.5 g/dL — ABNORMAL LOW (ref 13.0–17.1)
LYMPH#: 1.4 10*3/uL (ref 0.9–3.3)
LYMPH%: 50 % — AB (ref 14.0–49.0)
MCH: 30 pg (ref 27.2–33.4)
MCHC: 36.2 g/dL — ABNORMAL HIGH (ref 32.0–36.0)
MCV: 82.9 fL (ref 79.3–98.0)
MONO#: 0.3 10*3/uL (ref 0.1–0.9)
MONO%: 10.4 % (ref 0.0–14.0)
NEUT%: 34.9 % — ABNORMAL LOW (ref 39.0–75.0)
NEUTROS ABS: 1 10*3/uL — AB (ref 1.5–6.5)
Platelets: 56 10*3/uL — ABNORMAL LOW (ref 140–400)
RBC: 3.5 10*6/uL — AB (ref 4.20–5.82)
RDW: 20.5 % — ABNORMAL HIGH (ref 11.0–14.6)
WBC: 2.8 10*3/uL — AB (ref 4.0–10.3)
nRBC: 0 % (ref 0–0)

## 2014-03-07 LAB — COMPREHENSIVE METABOLIC PANEL (CC13)
ALBUMIN: 3.3 g/dL — AB (ref 3.5–5.0)
ALT: 15 U/L (ref 0–55)
ANION GAP: 6 meq/L (ref 3–11)
AST: 29 U/L (ref 5–34)
Alkaline Phosphatase: 58 U/L (ref 40–150)
BUN: 11.5 mg/dL (ref 7.0–26.0)
CHLORIDE: 110 meq/L — AB (ref 98–109)
CO2: 26 meq/L (ref 22–29)
Calcium: 9.2 mg/dL (ref 8.4–10.4)
Creatinine: 1.2 mg/dL (ref 0.7–1.3)
Glucose: 117 mg/dl (ref 70–140)
POTASSIUM: 3.6 meq/L (ref 3.5–5.1)
Sodium: 142 mEq/L (ref 136–145)
TOTAL PROTEIN: 7 g/dL (ref 6.4–8.3)
Total Bilirubin: 0.74 mg/dL (ref 0.20–1.20)

## 2014-03-07 LAB — TECHNOLOGIST REVIEW

## 2014-03-07 NOTE — Progress Notes (Signed)
[facility] OFFICE PROGRESS NOTE   Diagnosis: Multiple myeloma  INTERVAL HISTORY:   He returns as scheduled. He reports improvement in leg edema since starting Lasix. The lower legs are now "sore ". He complains of aching in the shoulders and knees. Good appetite. Negative leg Dopplers 02/28/2014.  Objective:  Vital signs in last 24 hours:  Blood pressure 137/66, pulse 87, temperature 97.7 F (36.5 C), temperature source Oral, resp. rate 20, height $RemoveBe'6\' 2"'SpZyQiowK$  (1.88 m), weight 265 lb 14.4 oz (120.611 kg).   Resp: Decreased breath sounds with coarse rhonchi at the posterior base bilaterally, no respiratory distress Cardio: Regular rate and rhythm GI: No hepatomegaly, nontender Vascular: Trace edema with chronic stasis change at the lower leg bilaterally   Lab Results:  Lab Results  Component Value Date   WBC 2.8* 03/07/2014   HGB 10.5* 03/07/2014   HCT 29.0* 03/07/2014   MCV 82.9 03/07/2014   PLT 56* 03/07/2014   NEUTROABS 1.0* 03/07/2014     Medications: I have reviewed the patient's current medications.  Assessment/Plan: 1. Multiple myeloma, status post treatment with melphalan/prednisone/thalidomide beginning in November 2009. The thalidomide was increased to 200 mg daily beginning 09/11/2008. The serum M spike was slightly improved on 03/18/2009 and slightly increased on 05/09/2009. He has been maintained off of specific therapy for multiple myeloma since September 2010. The serum M spike was increased on 10/20/2011 at 4.6. He began Revlimid on 11/11/2011 with weekly dexamethasone. He began cycle 2 of Revlimid on 12/09/2011. The serum M spike was improved on 12/31/2011. The serum M spike was slightly higher on 01/28/2012. He began cycle 4 on 02/03/2012. He developed recurrent hypercalcemia. Treatment was changed to Cytoxan/Velcade/Decadron beginning 03/02/2012. Cytoxan discontinued in January 2015 Treatment continued with Velcade/Decadron.  Stable serum M spike and IgG  10/04/2013.  Stable serum M spike 12/06/2013 Treatment placed on hold 01/10/2014 secondary to thrombocytopenia and a slightly higher M spike 2. Chronic "dizziness."  3. Chest x-ray 11/01/2008 with a patchy opacity at the right midlung suspicious for pneumonia. He was treated with Avelox. 4. Low back pain with a radicular component. An MRI on 10/07/2008 showed no involvement of the lumbosacral spine with myeloma. 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 5. Bilateral neck pain, status post a CT 05/17/2008 with findings of spinal stenosis and osteoarthritis. 6. 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 Red cell microcytosis, likely related to hemoglobin C trait. A ferritin level returned elevated and a stool Hemoccult was negative 01/23/2010. He underwent a colonoscopy in 2009. 8. Elevated beta-2 microglobulin level. 9. History of hypertension. 10. History of a herniated disk at L4-L5 on MRI an 01/11/2002. 11. Hyperlipidemia. 12. Gastroesophageal reflux disease. 13. History of atypical angina. 14. History of rectal bleeding-large external hemorrhoids noted 02/25/2012. The stool was Hemoccult positive. He has a history of colon polyps. He is followed by [facility]. He was diagnosed with hemorrhoids in September of 2013 while hospitalized. He underwent a band procedure 03/09/2013. He has had no further rectal bleeding. 15. Superficial venous thrombosis of the greater saphenous vein on a Doppler 09/27/2008. Negative for deep vein thrombosis. Symptoms improved with aspirin. 16. Neck pain with numbness/tingling in the arms, hands, and low back with proximal right leg weakness. An MRI of the cervical spine 05/30/2009 showed multilevel spondylosis with mild cord edema at C4-C5 and C5-C6. He was noted to have an enhancing lesion  of the cord at T3-T4 of  unclear etiology. In the lumbar spine there was no change in the spondylosis at L3-L4 and L4-L5. There was no involvement of the cervical or lumbar spine with myeloma. He is status post C4-C5, C5-C6, and C6-C7 anterior cervical diskectomy with fusion by Dr. Annette Stable 08/01/2009. 17. Thrombocytopenia-progressive, likely secondary to progression of multiple myeloma 18. Indeterminate-age deep vein thrombosis of the left lower extremity on a venous Doppler 08/15/2009. There were also findings consistent with superficial thrombosis involving the right lower extremity 08/15/2009. Coumadin was discontinued in October 2011. 19. Type 2 diabetes. 20. History of hematuria, followed at [facility]. Urinalysis was negative for blood on 07/23/2011. 21. Radicular back pain with MRI of the lumbar spine on 10/19/2011 showing nerve root impingement at L2-L3, L3-L4 and L4-L5 with the most severe at the right L3 nerve roots due to a large disc fragment. Associated right leg weakness. He underwent right L2-3 decompressive laminotomy with right L2 and L3 decompressive foraminotomy; right L2-3 microdiscectomy on 11/01/2011. 22. Constipation. He continues a laxative regimen. 23. Hospitalization 11/08/2011 through 11/10/2011 with orthostatic hypotension/dizziness. He improved with intravenous hydration. He had mild hypotension when here on 11/18/2011. We recommended discontinuing Norvasc and Proscar. 24. Hypercalcemia status post pamidronate 11/03/2011. Recurrent hypercalcemia 02/16/2012 status post Zometa. The calcium has remained in normal range. 25. Fractured tooth with associated pain. Status post a tooth extraction by Dr. Enrique Sack, Zometa was placed on hold. 26. Exertional dyspnea. Stable. He was referred to cardiology, pulmonary function studies 06/15/2013 showed a moderately severe diffusion defect. He utilizes supplemental oxygen as needed. He is followed by pulmonary. 27. Question Velcade neuropathy with moderate  decrease in vibratory sense over the fingertips.    Disposition:  Mr. Goh has progressive pancytopenia. We will followup on the IgG level and serum M spike from today. If this confirms progression of myeloma he will resume treatment. We will consider resuming Cytoxan/Velcade/Decadron versus a switch to pomalidomide.  Mr. Augello will return for an office visit and CBC in 2 weeks.  Betsy Coder, MD  03/07/2014  1:06 PM

## 2014-03-07 NOTE — Telephone Encounter (Signed)
Pt confirmed labs/ov per 08/27 POF, gave pt AVS...KJ °

## 2014-03-08 ENCOUNTER — Other Ambulatory Visit: Payer: Self-pay | Admitting: Internal Medicine

## 2014-03-08 NOTE — Telephone Encounter (Signed)
CY, Please advise if okay to refill. Thanks.  

## 2014-03-08 NOTE — Telephone Encounter (Signed)
Ok to refill 

## 2014-03-11 ENCOUNTER — Encounter: Payer: Self-pay | Admitting: Family

## 2014-03-11 ENCOUNTER — Ambulatory Visit (INDEPENDENT_AMBULATORY_CARE_PROVIDER_SITE_OTHER): Payer: Medicare HMO | Admitting: Family

## 2014-03-11 VITALS — BP 119/70 | HR 92 | Temp 97.0°F | Ht 74.0 in | Wt 267.6 lb

## 2014-03-11 DIAGNOSIS — R609 Edema, unspecified: Secondary | ICD-10-CM

## 2014-03-11 DIAGNOSIS — D61818 Other pancytopenia: Secondary | ICD-10-CM

## 2014-03-11 LAB — PROTEIN ELECTROPHORESIS, SERUM
Albumin ELP: 54.7 % — ABNORMAL LOW (ref 55.8–66.1)
Alpha-1-Globulin: 4.5 % (ref 2.9–4.9)
Alpha-2-Globulin: 9.5 % (ref 7.1–11.8)
BETA 2: 3.8 % (ref 3.2–6.5)
Beta Globulin: 5 % (ref 4.7–7.2)
Gamma Globulin: 22.5 % — ABNORMAL HIGH (ref 11.1–18.8)
M-SPIKE, %: 0.95 g/dL
Total Protein, Serum Electrophoresis: 6.9 g/dL (ref 6.0–8.3)

## 2014-03-11 LAB — IGG: IGG (IMMUNOGLOBIN G), SERUM: 1490 mg/dL (ref 650–1600)

## 2014-03-11 MED ORDER — TRAMADOL HCL 50 MG PO TABS: 50.0000 mg | ORAL_TABLET | Freq: Three times a day (TID) | ORAL | Status: DC | PRN

## 2014-03-11 NOTE — Patient Instructions (Signed)
Peripheral Edema °You have swelling in your legs (peripheral edema). This swelling is due to excess accumulation of salt and water in your body. Edema may be a sign of heart, kidney or liver disease, or a side effect of a medication. It may also be due to problems in the leg veins. Elevating your legs and using special support stockings may be very helpful, if the cause of the swelling is due to poor venous circulation. Avoid long periods of standing, whatever the cause. °Treatment of edema depends on identifying the cause. Chips, pretzels, pickles and other salty foods should be avoided. Restricting salt in your diet is almost always needed. Water pills (diuretics) are often used to remove the excess salt and water from your body via urine. These medicines prevent the kidney from reabsorbing sodium. This increases urine flow. °Diuretic treatment may also result in lowering of potassium levels in your body. Potassium supplements may be needed if you have to use diuretics daily. Daily weights can help you keep track of your progress in clearing your edema. You should call your caregiver for follow up care as recommended. °SEEK IMMEDIATE MEDICAL CARE IF:  °· You have increased swelling, pain, redness, or heat in your legs. °· You develop shortness of breath, especially when lying down. °· You develop chest or abdominal pain, weakness, or fainting. °· You have a fever. °Document Released: 08/05/2004 Document Revised: 09/20/2011 Document Reviewed: 07/16/2009 °ExitCare® Patient Information ©2015 [employer]. This information is not intended to replace advice given to you by your health care provider. Make sure you discuss any questions you have with your health care provider. ° °

## 2014-03-11 NOTE — Progress Notes (Signed)
   Subjective:    Patient ID: [name], male    DOB: 07/22/1935, 78 y.o.   MRN: [id number]  HPI Pt presents to the office for follow-up for peripheral edema. Pt states the swelling is better. Pt states his legs are sore when he touches them.  Pt states he does not feel good today. Pt is currently being treated for Multiple Myeloma. Pt states his last treatment was in June.    Review of Systems  Constitutional: Negative.   HENT: Negative.   Respiratory: Negative.   Cardiovascular: Positive for leg swelling.  Gastrointestinal: Negative.   Endocrine: Negative.   Genitourinary: Negative.   Musculoskeletal: Negative.   Neurological: Negative.   Hematological: Negative.   Psychiatric/Behavioral: Negative.   All other systems reviewed and are negative.      Objective:   Physical Exam  Vitals reviewed. Constitutional: He is oriented to person, place, and time. He appears well-developed and well-nourished. No distress.  HENT:  Head: Normocephalic.  Right Ear: External ear normal.  Left Ear: External ear normal.  Mouth/Throat: Oropharynx is clear and moist.  Eyes: Pupils are equal, round, and reactive to light. Right eye exhibits no discharge. Left eye exhibits no discharge.  Neck: Normal range of motion. Neck supple. No thyromegaly present.  Cardiovascular: Normal rate, regular rhythm, normal heart sounds and intact distal pulses.   No murmur heard. Pulmonary/Chest: Effort normal and breath sounds normal. No respiratory distress. He has no wheezes.  Abdominal: Soft. Bowel sounds are normal. He exhibits no distension. There is no tenderness.  Musculoskeletal: Normal range of motion. He exhibits edema (2+edema in BLE). He exhibits no tenderness.  Neurological: He is alert and oriented to person, place, and time. He has normal reflexes. No cranial nerve deficit.  Skin: Skin is warm and dry. No rash noted. No erythema.  Psychiatric: He has a normal mood and affect. His behavior is  normal. Judgment and thought content normal.     BP 119/70  Pulse 92  Temp(Src) 97 F (36.1 C) (Oral)  Ht $R'6\' 2"'yG$  (1.88 m)  Wt 267 lb 9.6 oz (121.383 kg)  BMI 34.34 kg/m2      Assessment & Plan:  1. Peripheral edema -Elevated feet when possible -Continue lasix daily -Keep all appointments with Oncologists -Rest -Ultram prn for leg pain and joint pain Meds ordered this encounter  Medications  . traMADol (ULTRAM) 50 MG tablet    Sig: Take 1 tablet (50 mg total) by mouth every 8 (eight) hours as needed.    Dispense:  60 tablet    Refill:  1    Order Specific Question:  Supervising Provider    Answer:  Chipper Herb [[id number]]   Evelina Dun, FNP

## 2014-03-12 ENCOUNTER — Telehealth: Payer: Self-pay | Admitting: Internal Medicine

## 2014-03-12 MED ORDER — CLONAZEPAM 0.5 MG PO TABS: ORAL_TABLET | ORAL | Status: DC

## 2014-03-12 NOTE — Telephone Encounter (Signed)
Called [facility]-gave verbal refill. Nothing more needed at this time.

## 2014-03-12 NOTE — Telephone Encounter (Signed)
Ok to refill total 6 months 

## 2014-03-12 NOTE — Telephone Encounter (Signed)
Please advise if okay to refill clonazepam  Last given on 11/12/13 0.5 mg # 30 take 1 to 2 qhsprn Last ov 01/14/14  Next ov 07/17/14

## 2014-03-19 ENCOUNTER — Encounter: Payer: Self-pay | Admitting: Gastroenterology

## 2014-03-19 ENCOUNTER — Encounter: Payer: Self-pay | Admitting: Internal Medicine

## 2014-03-20 ENCOUNTER — Encounter: Payer: Self-pay | Admitting: Cardiovascular Disease

## 2014-03-20 ENCOUNTER — Ambulatory Visit (INDEPENDENT_AMBULATORY_CARE_PROVIDER_SITE_OTHER): Payer: Medicare HMO | Admitting: Cardiovascular Disease

## 2014-03-20 VITALS — BP 117/76 | HR 89 | Ht 72.0 in | Wt 262.0 lb

## 2014-03-20 DIAGNOSIS — R9439 Abnormal result of other cardiovascular function study: Secondary | ICD-10-CM

## 2014-03-20 DIAGNOSIS — R0602 Shortness of breath: Secondary | ICD-10-CM

## 2014-03-20 DIAGNOSIS — R931 Abnormal findings on diagnostic imaging of heart and coronary circulation: Secondary | ICD-10-CM

## 2014-03-20 DIAGNOSIS — R600 Localized edema: Secondary | ICD-10-CM

## 2014-03-20 DIAGNOSIS — E782 Mixed hyperlipidemia: Secondary | ICD-10-CM

## 2014-03-20 DIAGNOSIS — I2510 Atherosclerotic heart disease of native coronary artery without angina pectoris: Secondary | ICD-10-CM

## 2014-03-20 DIAGNOSIS — R609 Edema, unspecified: Secondary | ICD-10-CM

## 2014-03-20 DIAGNOSIS — R942 Abnormal results of pulmonary function studies: Secondary | ICD-10-CM

## 2014-03-20 DIAGNOSIS — C9000 Multiple myeloma not having achieved remission: Secondary | ICD-10-CM

## 2014-03-20 DIAGNOSIS — I10 Essential (primary) hypertension: Secondary | ICD-10-CM

## 2014-03-20 MED ORDER — ASPIRIN EC 81 MG PO TBEC: 81.0000 mg | DELAYED_RELEASE_TABLET | Freq: Every day | ORAL | Status: DC

## 2014-03-20 MED ORDER — NITROGLYCERIN 0.4 MG SL SUBL
0.4000 mg | SUBLINGUAL_TABLET | SUBLINGUAL | Status: DC | PRN
Start: 2014-03-20 — End: 2014-07-17

## 2014-03-20 NOTE — Patient Instructions (Signed)
Your physician recommends that you schedule a follow-up appointment in: 6 months. You will receive a reminder letter in the mail in about 4 months reminding you to call and schedule your appointment. If you don't receive this letter, please contact our office. Your physician has recommended you make the following change in your medication:  Decrease aspirin to 81 mg daily. Continue all other medications the same.

## 2014-03-20 NOTE — Progress Notes (Signed)
Patient ID: [name], male   DOB: Feb 26, 1936, 78 y.o.   MRN: [id number]      SUBJECTIVE: The patient is here to followup on the results of cardiovascular testing performed for the evaluation of bilateral leg swelling in the setting of a hypercoagulable state given his multiple myeloma, as well as for the evaluation of dyspnea on exertion in the setting of CAD. Lower extremity Dopplers did not demonstrate any evidence of acute DVT, it did show mild residual wall thickening in the distal left femoral vein likely the sequelae of a remote prior DVT. Nuclear MPI study was deemed low risk. Perfusion imaging was suggestive of inferior wall scar and prior infarct, with no active ischemia however. LVEF was calculated at 60% with normal volumes and possible inferior basal  hypokinesis. His chronic dyspnea from COPD is "about the same". He denies chest pain but his wife says that he won't tell anyone when he does have chest pain but has been noted in the past to place his hand on his chest.    Review of Systems: As per "subjective", otherwise negative.  Allergies  Allergen Reactions  . Penicillins Hives and Rash    Current Outpatient Prescriptions  Medication Sig Dispense Refill  . acyclovir (ZOVIRAX) 400 MG tablet TAKE 1 TABLET (400 MG TOTAL) BY MOUTH 2 (TWO) TIMES DAILY.  60 tablet  6  . albuterol (PROVENTIL HFA) 108 (90 BASE) MCG/ACT inhaler Inhale 2 puffs into the lungs every 6 (six) hours as needed for wheezing or shortness of breath.  1 Inhaler  11  . AMBULATORY NON FORMULARY MEDICATION O2 @@ 2LMP whenever active      . amLODipine (NORVASC) 2.5 MG tablet Take 1 tablet (2.5 mg total) by mouth daily.  30 tablet  5  . aspirin 325 MG EC tablet Take 1 tablet (325 mg total) by mouth 2 (two) times daily. Do not take again until March 20, 2013      . atorvastatin (LIPITOR) 20 MG tablet TAKE 1 TABLET (20 MG TOTAL) BY MOUTH DAILY.  90 tablet  3  . clonazePAM (KLONOPIN) 0.5 MG tablet TAKE 1-2 TABLETS  THIRTY MINUTES BEFORE BEDTIME AS NEEDED  30 tablet  5  . cromolyn (NASALCROM) 5.2 MG/ACT nasal spray Place 1 spray into both nostrils 4 (four) times daily.  26 mL  12  . ENSURE PLUS (ENSURE PLUS) LIQD Take 237 mLs by mouth.      . fluticasone (FLONASE) 50 MCG/ACT nasal spray Place 2 sprays into both nostrils daily.  48 g  0  . furosemide (LASIX) 20 MG tablet Take 1 tablet (20 mg total) by mouth daily.  90 tablet  3  . lactulose (CHRONULAC) 10 GM/15ML solution Take 10 g by mouth 2 (two) times daily as needed.      . loratadine (CLARITIN) 10 MG tablet Take 1 tablet (10 mg total) by mouth daily.  30 tablet  5  . NITROSTAT 0.4 MG SL tablet Place 0.4 mg under the tongue as needed for chest pain.       . pantoprazole (PROTONIX) 40 MG tablet Take 1 tablet (40 mg total) by mouth daily.  30 tablet  5  . polyethylene glycol powder (GLYCOLAX/MIRALAX) powder TAKE 1 TO 2 DOSES DAILY AS NEEDED  527 g  2  . potassium chloride SA (KLOR-CON M20) 20 MEQ tablet Take 1 tablet (20 mEq total) by mouth daily.  90 tablet  0  . prochlorperazine (COMPAZINE) 10 MG tablet TAKE 1 TABLET BY  MOUTH EVERY SIX HOURS AS NEEDED  30 tablet  2  . tamsulosin (FLOMAX) 0.4 MG CAPS capsule TAKE 1 CAPSULE (0.4 MG TOTAL) BY MOUTH 2 (TWO) TIMES DAILY. WITH A MEAL.  180 capsule  0  . traMADol (ULTRAM) 50 MG tablet Take 1 tablet (50 mg total) by mouth every 8 (eight) hours as needed.  60 tablet  1   No current facility-administered medications for this visit.    Past Medical History  Diagnosis Date  . GERD (gastroesophageal reflux disease)   . Essential hypertension, benign   . BPH (benign prostatic hyperplasia)   . Lumbar herniated disc     L4-5  . Hyperlipidemia   . Sleep apnea   . Multiple myeloma 02/19/2008  . Arthritis   . Diverticulosis   . Peptic ulcer disease   . Tubular adenoma 02/16/2008    Dr. Silvano Rusk    Past Surgical History  Procedure Laterality Date  . Neck surgery    . Lumbar laminectomy/decompression  microdiscectomy  11/01/2011    Procedure: LUMBAR LAMINECTOMY/DECOMPRESSION MICRODISCECTOMY 2 LEVELS;  Surgeon: Charlie Pitter, MD;  Location: [facility];  Service: Neurosurgery;  Laterality: Right;  Lumbar Laminectomy/Microdiscectomy Decompression Lumbar Three-Four, Lumbar Four-Five   . Colonoscopy    . Hemorrhoid banding  2014    History   Social History  . Marital Status: Married    Spouse Name: N/A    Number of Children: 4  . Years of Education: N/A   Occupational History  . retired-disabled-Construction    Social History Main Topics  . Smoking status: Former Smoker -- 2.00 packs/day for 20 years    Types: Cigarettes    Quit date: 07/12/1990  . Smokeless tobacco: Never Used  . Alcohol Use: Yes     Comment: "Very seldom"  . Drug Use: No  . Sexual Activity: No   Other Topics Concern  . Not on file   Social History Narrative   Pt was adopted.   Married   Scientist, product/process development Vitals:   03/20/14 1516  BP: 117/76  Pulse: 89  Height: 6' (1.829 m)  Weight: 262 lb (118.842 kg)  SpO2: 93%    PHYSICAL EXAM General: NAD HEENT: Normal. Neck: No JVD, no thyromegaly. Lungs: Clear to auscultation bilaterally with normal respiratory effort. CV: Nondisplaced PMI.  Regular rate and rhythm, normal S1/S2, no S3/S4, no murmur. Trace pretibial and periankle edema.  No carotid bruit.  Stasis dermatitis. Abdomen: Soft, nontender, no hepatosplenomegaly, no distention.  Neurologic: Alert and oriented x 3.  Psych: Normal affect. Skin: Normal. Musculoskeletal: Normal range of motion, no gross deformities. Extremities: No clubbing or cyanosis.   ECG: Most recent ECG reviewed.      ASSESSMENT AND PLAN: 1. Bilateral leg edema and DOE with fatigue: He has a history of mild diastolic dysfunction. Given the low BNP, his dyspnea is likely related to COPD and not heart failure. Lexiscan Cardiolite stress test demonstrated inferior wall scar/infarct. Bilateral lower extremity  Dopplers did not show acute DVT, but possible sequelae of old DVT. Continue Lasix 20 mg daily. CT angiography earlier this year did not demonstrate pulmonary embolism.  2. Essential HTN: Controlled on present therapy.  3. CAD: Nuclear MPI results as noted above. Continue ASA (reduce to 81 mg daily) and Lipitor. Will not initiate beta blocker given his COPD. I encouraged him to use SL nitro for chest pain. 4. COPD: Has not been using inhalers. PFT results reviewed. Follows with pulmonary.  5. Sleep apnea: Using CPAP.  6. Multiple myeloma: Follows with hem-onc. Hgb on 8/27 was 10.5.   Dispo: f/u 6 months.  Kate Sable, M.D., F.A.C.C.

## 2014-03-21 ENCOUNTER — Ambulatory Visit (HOSPITAL_BASED_OUTPATIENT_CLINIC_OR_DEPARTMENT_OTHER): Payer: Medicare HMO | Admitting: Nurse Practitioner

## 2014-03-21 ENCOUNTER — Other Ambulatory Visit (HOSPITAL_BASED_OUTPATIENT_CLINIC_OR_DEPARTMENT_OTHER): Payer: Medicare HMO

## 2014-03-21 ENCOUNTER — Telehealth: Payer: Self-pay | Admitting: Nurse Practitioner

## 2014-03-21 VITALS — BP 146/69 | HR 89 | Temp 97.7°F | Resp 18 | Ht 72.0 in | Wt 263.4 lb

## 2014-03-21 DIAGNOSIS — Z23 Encounter for immunization: Secondary | ICD-10-CM

## 2014-03-21 DIAGNOSIS — C9000 Multiple myeloma not having achieved remission: Secondary | ICD-10-CM

## 2014-03-21 DIAGNOSIS — Z86718 Personal history of other venous thrombosis and embolism: Secondary | ICD-10-CM

## 2014-03-21 DIAGNOSIS — K5900 Constipation, unspecified: Secondary | ICD-10-CM

## 2014-03-21 DIAGNOSIS — D696 Thrombocytopenia, unspecified: Secondary | ICD-10-CM

## 2014-03-21 LAB — CBC WITH DIFFERENTIAL/PLATELET
BASO%: 0.4 % (ref 0.0–2.0)
BASOS ABS: 0 10*3/uL (ref 0.0–0.1)
EOS%: 3.5 % (ref 0.0–7.0)
Eosinophils Absolute: 0.1 10*3/uL (ref 0.0–0.5)
HCT: 29.7 % — ABNORMAL LOW (ref 38.4–49.9)
HGB: 10.7 g/dL — ABNORMAL LOW (ref 13.0–17.1)
LYMPH%: 55.1 % — ABNORMAL HIGH (ref 14.0–49.0)
MCH: 30.2 pg (ref 27.2–33.4)
MCHC: 36 g/dL (ref 32.0–36.0)
MCV: 83.9 fL (ref 79.3–98.0)
MONO#: 0.3 10*3/uL (ref 0.1–0.9)
MONO%: 10.5 % (ref 0.0–14.0)
NEUT#: 0.9 10*3/uL — ABNORMAL LOW (ref 1.5–6.5)
NEUT%: 30.5 % — ABNORMAL LOW (ref 39.0–75.0)
Platelets: 50 10*3/uL — ABNORMAL LOW (ref 140–400)
RBC: 3.54 10*6/uL — AB (ref 4.20–5.82)
RDW: 20.9 % — AB (ref 11.0–14.6)
WBC: 2.9 10*3/uL — ABNORMAL LOW (ref 4.0–10.3)
lymph#: 1.6 10*3/uL (ref 0.9–3.3)
nRBC: 0 % (ref 0–0)

## 2014-03-21 LAB — TECHNOLOGIST REVIEW

## 2014-03-21 MED ORDER — DEXAMETHASONE 4 MG PO TABS: 20.0000 mg | ORAL_TABLET | ORAL | Status: DC

## 2014-03-21 MED ORDER — INFLUENZA VAC SPLIT QUAD 0.5 ML IM SUSY
0.5000 mL | PREFILLED_SYRINGE | Freq: Once | INTRAMUSCULAR | Status: AC
  Administered 2014-03-21: 0.5 mL via INTRAMUSCULAR
  Filled 2014-03-21: qty 0.5

## 2014-03-21 NOTE — Progress Notes (Addendum)
[facility] OFFICE PROGRESS NOTE   Diagnosis:  Multiple myeloma  INTERVAL HISTORY:   [name] returns as scheduled. He reports that he feels "weak and tired". He continues to have an "aching" pain in his shoulders, knees and ankles. He has stable dyspnea on exertion. No fever or cough. He is intermittently hoarse. Leg swelling continues to be improved. He is intermittently constipated.  Objective:  Vital signs in last 24 hours:  Blood pressure 146/69, pulse 89, temperature 97.7 F (36.5 C), temperature source Oral, resp. rate 18, height 6' (1.829 m), weight 263 lb 6.4 oz (119.477 kg), SpO2 93.00%.    HEENT: No thrush or ulcers. Resp: Rales at both lung bases. No respiratory distress. Cardio: Regular rate and rhythm. GI: Abdomen soft and nontender. No hepatomegaly. Vascular: Trace lower leg edema bilaterally with chronic stasis changes.     Lab Results:  Lab Results  Component Value Date   WBC 2.9* 03/21/2014   HGB 10.7* 03/21/2014   HCT 29.7* 03/21/2014   MCV 83.9 03/21/2014   PLT 50* 03/21/2014   NEUTROABS 0.9* 03/21/2014    Imaging:  No results found.  Medications: I have reviewed the patient's current medications.  Assessment/Plan: 1. Multiple myeloma, status post treatment with melphalan/prednisone/thalidomide beginning in November 2009. The thalidomide was increased to 200 mg daily beginning 09/11/2008. The serum M spike was slightly improved on 03/18/2009 and slightly increased on 05/09/2009. He has been maintained off of specific therapy for multiple myeloma since September 2010. The serum M spike was increased on 10/20/2011 at 4.6. He began Revlimid on 11/11/2011 with weekly dexamethasone. He began cycle 2 of Revlimid on 12/09/2011. The serum M spike was improved on 12/31/2011. The serum M spike was slightly higher on 01/28/2012. He began cycle 4 on 02/03/2012. He developed recurrent hypercalcemia. Treatment was changed to Cytoxan/Velcade/Decadron  beginning 03/02/2012. Cytoxan discontinued in January 2015 Treatment continued with Velcade/Decadron.  Stable serum M spike and IgG 10/04/2013.  Stable serum M spike 12/06/2013  Treatment placed on hold 01/10/2014 secondary to thrombocytopenia and a slightly higher M spike Serum M spike and IgG increased 03/07/2014. 2. Chronic "dizziness."  3. Chest x-ray 11/01/2008 with a patchy opacity at the right midlung suspicious for pneumonia. He was treated with Avelox. 4. Low back pain with a radicular component. An MRI on 10/07/2008 showed no involvement of the lumbosacral spine with myeloma. 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 5. Bilateral neck pain, status post a CT 05/17/2008 with findings of spinal stenosis and osteoarthritis. 6. 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 Red cell microcytosis, likely related to hemoglobin C trait. A ferritin level returned elevated and a stool Hemoccult was negative 01/23/2010. He underwent a colonoscopy in 2009. 8. Elevated beta-2 microglobulin level. 9. History of hypertension. 10. History of a herniated disk at L4-L5 on MRI an 01/11/2002. 11. Hyperlipidemia. 12. Gastroesophageal reflux disease. 13. History of atypical angina. 14. History of rectal bleeding-large external hemorrhoids noted 02/25/2012. The stool was Hemoccult positive. He has a history of colon polyps. He is followed by [facility]. He was diagnosed with hemorrhoids in September of 2013 while hospitalized. He underwent a band procedure 03/09/2013. He has had no further rectal bleeding. 15. Superficial venous thrombosis of the greater saphenous vein on a Doppler 09/27/2008. Negative for deep vein thrombosis. Symptoms improved with aspirin. 16. Neck pain with numbness/tingling in the arms, hands, and  low back with proximal right leg  weakness. An MRI of the cervical spine 05/30/2009 showed multilevel spondylosis with mild cord edema at C4-C5 and C5-C6. He was noted to have an enhancing lesion of the cord at T3-T4 of unclear etiology. In the lumbar spine there was no change in the spondylosis at L3-L4 and L4-L5. There was no involvement of the cervical or lumbar spine with myeloma. He is status post C4-C5, C5-C6, and C6-C7 anterior cervical diskectomy with fusion by Dr. Pool 08/01/2009. 17. Thrombocytopenia-progressive, likely secondary to progression of multiple myeloma 18. Indeterminate-age deep vein thrombosis of the left lower extremity on a venous Doppler 08/15/2009. There were also findings consistent with superficial thrombosis involving the right lower extremity 08/15/2009. Coumadin was discontinued in October 2011. 19. Type 2 diabetes. 20. History of hematuria, followed at [facility]. Urinalysis was negative for blood on 07/23/2011. 21. Radicular back pain with MRI of the lumbar spine on 10/19/2011 showing nerve root impingement at L2-L3, L3-L4 and L4-L5 with the most severe at the right L3 nerve roots due to a large disc fragment. Associated right leg weakness. He underwent right L2-3 decompressive laminotomy with right L2 and L3 decompressive foraminotomy; right L2-3 microdiscectomy on 11/01/2011. 22. Constipation. He continues a laxative regimen. 23. Hospitalization 11/08/2011 through 11/10/2011 with orthostatic hypotension/dizziness. He improved with intravenous hydration. He had mild hypotension when here on 11/18/2011. We recommended discontinuing Norvasc and Proscar. 24. Hypercalcemia status post pamidronate 11/03/2011. Recurrent hypercalcemia 02/16/2012 status post Zometa. The calcium has remained in normal range. 25. Fractured tooth with associated pain. Status post a tooth extraction by Dr. Kulinski, Zometa was placed on hold. 26. Exertional dyspnea. Stable. He was referred to cardiology, pulmonary function  studies 06/15/2013 showed a moderately severe diffusion defect. He utilizes supplemental oxygen as needed. He is followed by pulmonary. 27. 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    Disposition: [name] appears unchanged. The serum M spike and IgG have increased and he has progressive pancytopenia. He understands this indicates progression of the myeloma.   Dr. Sherrill recommends initiation of Pomalidomide/dexamethasone. We reviewed potential toxicities associated with Pomalidomide including myelosuppression, nausea, diarrhea or constipation, neuropathy, blood clots, rash. He has taken dexamethasone in the past and is familiar with the potential side effects. Written information on Pomalidomide and dexamethasone was provided at today's visit.  We anticipate he will begin cycle one Pomalidomide on 03/25/2014 (4 mg daily x21 day followed by a 7 day break). He will take dexamethasone 20 mg weekly.  He will return for a repeat CBC on 04/08/2014. We will see him in followup on 04/18/2014. He will contact the office in the interim with any problems.  The influenza vaccine will be administered today.  Patient seen with Dr. Sherrill. 25 minutes were spent face-to-face at today's visit with the majority of the time involved in counseling/coordination of care.  ,  ANP/GNP-BC   03/21/2014  2:24 PM  This was a shared visit with  . [name] has progressive multiple myeloma. The plan is to begin salvage therapy with pomalidomide/Decadron. We reviewed the potential toxicities associated with this regimen. He agrees to proceed. We will follow the CBC closely while on pomalidomide.  Brad Sherrill, M.D.       

## 2014-03-21 NOTE — Telephone Encounter (Signed)
Pt confirmed labs/ov per 09/10 POF, gave pt AVS,....Cherylann Banas

## 2014-03-21 NOTE — Patient Instructions (Signed)
Dexamethasone tablets What is this medicine? DEXAMETHASONE (dex a METH a sone) is a corticosteroid. It is commonly used to treat inflammation of the skin, joints, lungs, and other organs. Common conditions treated include asthma, allergies, and arthritis. It is also used for other conditions, such as blood disorders and diseases of the adrenal glands. This medicine may be used for other purposes; ask your health care provider or pharmacist if you have questions. COMMON BRAND NAME(S): Decadron, DexPak Jr TaperPak, DexPak TaperPak, Zema-Pak What should I tell my health care provider before I take this medicine? They need to know if you have any of these conditions: -Cushing's syndrome -diabetes -glaucoma -heart problems or disease -high blood pressure -infection like herpes, measles, tuberculosis, or chickenpox -kidney disease -liver disease -mental problems -myasthenia gravis -osteoporosis -previous heart attack -seizures -stomach, ulcer or intestine disease including colitis and diverticulitis -thyroid problem -an unusual or allergic reaction to dexamethasone, corticosteroids, other medicines, lactose, foods, dyes, or preservatives -pregnant or trying to get pregnant -breast-feeding How should I use this medicine? Take this medicine by mouth with a drink of water. Follow the directions on the prescription label. Take it with food or milk to avoid stomach upset. If you are taking this medicine once a day, take it in the morning. Do not take more medicine than you are told to take. Do not suddenly stop taking your medicine because you may develop a severe reaction. Your doctor will tell you how much medicine to take. If your doctor wants you to stop the medicine, the dose may be slowly lowered over time to avoid any side effects. Talk to your pediatrician regarding the use of this medicine in children. Special care may be needed. Patients over 65 years old may have a stronger reaction and  need a smaller dose. Overdosage: If you think you have taken too much of this medicine contact a poison control center or emergency room at once. NOTE: This medicine is only for you. Do not share this medicine with others. What if I miss a dose? If you miss a dose, take it as soon as you can. If it is almost time for your next dose, talk to your doctor or health care professional. You may need to miss a dose or take an extra dose. Do not take double or extra doses without advice. What may interact with this medicine? Do not take this medicine with any of the following medications: -mifepristone, RU-486 -vaccines This medicine may also interact with the following medications: -amphotericin B -antibiotics like clarithromycin, erythromycin, and troleandomycin -aspirin and aspirin-like drugs -barbiturates like phenobarbital -carbamazepine -cholestyramine -cholinesterase inhibitors like donepezil, galantamine, rivastigmine, and tacrine -cyclosporine -digoxin -diuretics -ephedrine -male hormones, like estrogens or progestins and birth control pills -indinavir -isoniazid -ketoconazole -medicines for diabetes -medicines that improve muscle tone or strength for conditions like myasthenia gravis -NSAIDs, medicines for pain and inflammation, like ibuprofen or naproxen -phenytoin -rifampin -thalidomide -warfarin This list may not describe all possible interactions. Give your health care provider a list of all the medicines, herbs, non-prescription drugs, or dietary supplements you use. Also tell them if you smoke, drink alcohol, or use illegal drugs. Some items may interact with your medicine. What should I watch for while using this medicine? Visit your doctor or health care professional for regular checks on your progress. If you are taking this medicine over a prolonged period, carry an identification card with your name and address, the type and dose of your medicine, and your doctor's  name   and address. This medicine may increase your risk of getting an infection. Stay away from people who are sick. Tell your doctor or health care professional if you are around anyone with measles or chickenpox. If you are going to have surgery, tell your doctor or health care professional that you have taken this medicine within the last twelve months. Ask your doctor or health care professional about your diet. You may need to lower the amount of salt you eat. The medicine can increase your blood sugar. If you are a diabetic check with your doctor if you need help adjusting the dose of your diabetic medicine. What side effects may I notice from receiving this medicine? Side effects that you should report to your doctor or health care professional as soon as possible: -allergic reactions like skin rash, itching or hives, swelling of the face, lips, or tongue -changes in vision -fever, sore throat, sneezing, cough, or other signs of infection, wounds that will not heal -increased thirst -mental depression, mood swings, mistaken feelings of self importance or of being mistreated -pain in hips, back, ribs, arms, shoulders, or legs -redness, blistering, peeling or loosening of the skin, including inside the mouth -trouble passing urine or change in the amount of urine -swelling of feet or lower legs -unusual bleeding or bruising Side effects that usually do not require medical attention (report to your doctor or health care professional if they continue or are bothersome): -headache -nausea, vomiting -skin problems, acne, thin and shiny skin -weight gain This list may not describe all possible side effects. Call your doctor for medical advice about side effects. You may report side effects to FDA at 1-800-FDA-1088. Where should I keep my medicine? Keep out of the reach of children. Store at room temperature between 20 and 25 degrees C (68 and 77 degrees F). Protect from light. Throw away any  unused medicine after the expiration date. NOTE: This sheet is a summary. It may not cover all possible information. If you have questions about this medicine, talk to your doctor, pharmacist, or health care provider.  2015, Elsevier/Gold Standard. (2007-10-19 14:02:13) Pomalidomide oral capsules What is this medicine? POMALIDOMIDE (pom a LID oh mide) is a chemotherapy drug used to treat multiple myeloma. It targets specific proteins within cancer cells and stops the cancer cell from growing. This medicine may be used for other purposes; ask your health care provider or pharmacist if you have questions. COMMON BRAND NAME(S): POMALYST What should I tell my health care provider before I take this medicine? They need to know if you have any of these conditions: -high blood pressure -high cholesterol -history of blood clots -irregular monthly periods or menstrual cycles -smoke tobacco -an unusual or allergic reaction to pomalidomide, other medicines, foods, dyes, or preservatives -pregnant or trying to get pregnant -breast-feeding How should I use this medicine? Take this medicine by mouth with a glass of water. Follow the directions on the prescription label. Take this medicine on an empty stomach, at least 2 hours before or 2 hours after a meal. Do not take with food. Do not cut, crush, or chew this medicine. Take your medicine at regular intervals. Do not take it more often than directed. Do not stop taking except on your doctor's advice. A special MedGuide will be given to you by the pharmacist with each prescription and refill. Be sure to read this information carefully each time. Talk to your pediatrician regarding the use of this medicine in children. Special care may be   needed. Overdosage: If you think you've taken too much of this medicine contact a poison control center or emergency room at once. Overdosage: If you think you have taken too much of this medicine contact a poison control  center or emergency room at once. NOTE: This medicine is only for you. Do not share this medicine with others. What if I miss a dose? If you miss a dose, take it as soon as you can. If your next dose is to be taken in less than 12 hours, then do not take the missed dose. Take the next dose at your regular time. Do not take double or extra doses. What may interact with this medicine? This medicine may interact with the following medications: -amprenavir -boceprevir -carbamazepine -dalfopristin; quinupristin -delavirdine -enzalutamide -fosamprenavir -indinavir -isoniazid, INH -itraconazole -ketoconazole -nicardipine -rifampin -ritonavir -St. John's Wort, Hypericum perforatum -telaprevir -telithromycin -thiabendazole -tipranavir -tobacco (cigarettes) This list may not describe all possible interactions. Give your health care provider a list of all the medicines, herbs, non-prescription drugs, or dietary supplements you use. Also tell them if you smoke, drink alcohol, or use illegal drugs. Some items may interact with your medicine. What should I watch for while using this medicine? This drug may make you feel generally unwell. This is not uncommon, as chemotherapy can affect healthy cells as well as cancer cells. Report any side effects. Continue your course of treatment even though you feel ill unless your doctor tells you to stop. This medicine is available only through a special program. Doctors, pharmacies, and patients must meet all of the conditions of the program. Your health care provider will help you get signed up with the program if you need this medicine. Through the program you will only receive up to a 28 day supply of the medicine at one time. You will need a new prescription for each refill. This medicine can cause birth defects. Do not get pregnant while taking this drug. Females with child-bearing potential will need to have 2 negative pregnancy tests before starting this  medicine. Pregnancy testing must be done every 2 to 4 weeks as directed while taking this medicine. Use 2 reliable forms of birth control together while you are taking this medicine and for 1 month after you stop taking this medicine. If you think that you might be pregnant talk to your doctor right away. Men must use a latex condom during sexual contact with a woman while taking this medicine and for 28 days after you stop taking this medicine. A latex condom is needed even if you have had a vasectomy. Contact your doctor right away if your partner becomes pregnant. Do not donate sperm while taking this medicine and for 28 days after you stop taking this medicine. Do not give blood while taking the medicine and for 1 month after completion of treatment to avoid exposing pregnant women to the medicine through the donated blood. You may need blood work done while you are taking this medicine. Talk to your doctor about your risk of cancer. You may be more at risk for certain types of cancers if you take this medicine. What side effects may I notice from receiving this medicine? Side effects that you should report to your doctor or health care professional as soon as possible: -allergic reactions like skin rash, itching or hives, swelling of the face, lips, or tongue -low blood counts - this medicine may decrease the number of white blood cells, red blood cells and platelets. You may be   at increased risk for infections and bleeding -signs and symptoms of a blood clot such as breathing problems; changes in vision; chest pain; severe, sudden headache; pain, swelling, warmth in the leg; trouble speaking; sudden numbness or weakness of the face, arm or leg -signs and symptoms of liver injury like dark yellow or brown urine; general ill feeling or flu-like symptoms; light-colored stools; loss of appetite; nausea; right upper belly pain; unusually weak or tired; yellowing of the eyes or skin -signs and symptoms of  a stroke like changes in vision; confusion; trouble speaking or understanding; severe headaches; sudden numbness or weakness of the face, arm or leg; trouble walking; dizziness; loss of balance or coordination -sweating -tingling, numbness in the hands or feet -unusual bleeding or bruising Side effects that usually do not require medical attention (Report these to your doctor or health care professional if they continue or are bothersome.): -back pain -constipation -diarrhea -nausea -tiredness This list may not describe all possible side effects. Call your doctor for medical advice about side effects. You may report side effects to FDA at 1-800-FDA-1088. Where should I keep my medicine? Keep out of the reach of children. Store between 20 and 25 degrees C (68 and 77 degrees F). Throw away any unused medicine after the expiration date. NOTE: This sheet is a summary. It may not cover all possible information. If you have questions about this medicine, talk to your doctor, pharmacist, or health care provider.  2015, Elsevier/Gold Standard. (2013-11-06 13:26:59)  

## 2014-03-22 ENCOUNTER — Encounter: Payer: Self-pay | Admitting: Oncology

## 2014-03-22 ENCOUNTER — Other Ambulatory Visit: Payer: Self-pay

## 2014-03-22 MED ORDER — POMALIDOMIDE 4 MG PO CAPS: 4.0000 mg | ORAL_CAPSULE | Freq: Every day | ORAL | Status: DC

## 2014-03-22 NOTE — Telephone Encounter (Signed)
Prescription given to [facility] in Managed care for review of pharmacy.

## 2014-03-22 NOTE — Progress Notes (Signed)
[facility], [id number], approved pomalyst from 03/22/14-07/11/14

## 2014-03-25 ENCOUNTER — Telehealth: Payer: Self-pay | Admitting: *Deleted

## 2014-03-25 ENCOUNTER — Encounter: Payer: Self-pay | Admitting: Oncology

## 2014-03-25 ENCOUNTER — Other Ambulatory Visit: Payer: Self-pay | Admitting: *Deleted

## 2014-03-25 MED ORDER — LORATADINE 10 MG PO TABS: 10.0000 mg | ORAL_TABLET | Freq: Every day | ORAL | Status: DC

## 2014-03-25 NOTE — Progress Notes (Signed)
[facility] is unable to fill pomalyst, faxed to [facility].

## 2014-03-25 NOTE — Telephone Encounter (Signed)
Received phone call from patient's wife regarding Pomalyst  prescription.  Informed patient's wife that pomalyst prescription was sent to [facility] and that patient needs to start medication when he gets it.  Per Elby Showers. Thomas.  Patient's wife verbalized understanding.

## 2014-03-25 NOTE — Telephone Encounter (Signed)
[facility] Authorization # [id number]

## 2014-03-27 ENCOUNTER — Other Ambulatory Visit: Payer: Self-pay | Admitting: Family Medicine

## 2014-03-27 MED ORDER — LORATADINE 10 MG PO TABS: 10.0000 mg | ORAL_TABLET | Freq: Every day | ORAL | Status: DC

## 2014-04-04 ENCOUNTER — Telehealth: Payer: Self-pay | Admitting: *Deleted

## 2014-04-04 MED ORDER — POLYETHYLENE GLYCOL 3350 17 GM/SCOOP PO POWD: ORAL | Status: DC

## 2014-04-04 NOTE — Telephone Encounter (Signed)
RX sent

## 2014-04-07 NOTE — Assessment & Plan Note (Signed)
He has been having an unusually hard time getting comfortable with CPAP. Appropriate adjustments discussed. Consider oral appliance Plan-D[facility] change CPAP to auto titration 7-10

## 2014-04-07 NOTE — Assessment & Plan Note (Signed)
Likely diagnosis in former smoker. Mild intermittent wheezing, generally controlled

## 2014-04-08 ENCOUNTER — Other Ambulatory Visit (HOSPITAL_BASED_OUTPATIENT_CLINIC_OR_DEPARTMENT_OTHER): Payer: Medicare HMO

## 2014-04-08 DIAGNOSIS — C9000 Multiple myeloma not having achieved remission: Secondary | ICD-10-CM

## 2014-04-08 LAB — CBC WITH DIFFERENTIAL/PLATELET
BASO%: 0.3 % (ref 0.0–2.0)
BASOS ABS: 0 10*3/uL (ref 0.0–0.1)
EOS%: 2.7 % (ref 0.0–7.0)
Eosinophils Absolute: 0.1 10*3/uL (ref 0.0–0.5)
HEMATOCRIT: 28.8 % — AB (ref 38.4–49.9)
HEMOGLOBIN: 10.4 g/dL — AB (ref 13.0–17.1)
LYMPH%: 42.9 % (ref 14.0–49.0)
MCH: 30.1 pg (ref 27.2–33.4)
MCHC: 36.1 g/dL — ABNORMAL HIGH (ref 32.0–36.0)
MCV: 83.2 fL (ref 79.3–98.0)
MONO#: 0.2 10*3/uL (ref 0.1–0.9)
MONO%: 7.8 % (ref 0.0–14.0)
NEUT#: 1.4 10*3/uL — ABNORMAL LOW (ref 1.5–6.5)
NEUT%: 46.3 % (ref 39.0–75.0)
Platelets: 65 10*3/uL — ABNORMAL LOW (ref 140–400)
RBC: 3.46 10*6/uL — ABNORMAL LOW (ref 4.20–5.82)
RDW: 21 % — ABNORMAL HIGH (ref 11.0–14.6)
WBC: 2.9 10*3/uL — ABNORMAL LOW (ref 4.0–10.3)
lymph#: 1.3 10*3/uL (ref 0.9–3.3)
nRBC: 0 % (ref 0–0)

## 2014-04-18 ENCOUNTER — Ambulatory Visit (HOSPITAL_BASED_OUTPATIENT_CLINIC_OR_DEPARTMENT_OTHER): Payer: Medicare HMO | Admitting: Nurse Practitioner

## 2014-04-18 ENCOUNTER — Telehealth: Payer: Self-pay | Admitting: Oncology

## 2014-04-18 ENCOUNTER — Other Ambulatory Visit (HOSPITAL_BASED_OUTPATIENT_CLINIC_OR_DEPARTMENT_OTHER): Payer: Medicare HMO

## 2014-04-18 VITALS — BP 141/68 | HR 84 | Temp 97.8°F | Resp 17 | Ht 72.0 in | Wt 259.8 lb

## 2014-04-18 DIAGNOSIS — D696 Thrombocytopenia, unspecified: Secondary | ICD-10-CM

## 2014-04-18 DIAGNOSIS — C9000 Multiple myeloma not having achieved remission: Secondary | ICD-10-CM

## 2014-04-18 DIAGNOSIS — R42 Dizziness and giddiness: Secondary | ICD-10-CM

## 2014-04-18 DIAGNOSIS — Z86718 Personal history of other venous thrombosis and embolism: Secondary | ICD-10-CM

## 2014-04-18 DIAGNOSIS — K5900 Constipation, unspecified: Secondary | ICD-10-CM

## 2014-04-18 DIAGNOSIS — D709 Neutropenia, unspecified: Secondary | ICD-10-CM

## 2014-04-18 LAB — CBC WITH DIFFERENTIAL/PLATELET
BASO%: 3.6 % — AB (ref 0.0–2.0)
Basophils Absolute: 0.1 10*3/uL (ref 0.0–0.1)
EOS%: 10 % — AB (ref 0.0–7.0)
Eosinophils Absolute: 0.2 10*3/uL (ref 0.0–0.5)
HCT: 30.3 % — ABNORMAL LOW (ref 38.4–49.9)
HGB: 10.2 g/dL — ABNORMAL LOW (ref 13.0–17.1)
LYMPH%: 61.9 % — AB (ref 14.0–49.0)
MCH: 31.1 pg (ref 27.2–33.4)
MCHC: 33.9 g/dL (ref 32.0–36.0)
MCV: 91.9 fL (ref 79.3–98.0)
MONO#: 0.1 10*3/uL (ref 0.1–0.9)
MONO%: 4.8 % (ref 0.0–14.0)
NEUT#: 0.4 10*3/uL — CL (ref 1.5–6.5)
NEUT%: 19.7 % — ABNORMAL LOW (ref 39.0–75.0)
PLATELETS: 42 10*3/uL — AB (ref 140–400)
RBC: 3.29 10*6/uL — AB (ref 4.20–5.82)
RDW: 19.1 % — AB (ref 11.0–14.6)
WBC: 2 10*3/uL — ABNORMAL LOW (ref 4.0–10.3)
lymph#: 1.2 10*3/uL (ref 0.9–3.3)

## 2014-04-18 LAB — COMPREHENSIVE METABOLIC PANEL (CC13)
ALK PHOS: 64 U/L (ref 40–150)
ALT: 11 U/L (ref 0–55)
AST: 8 U/L (ref 5–34)
Albumin: 3 g/dL — ABNORMAL LOW (ref 3.5–5.0)
Anion Gap: 5 mEq/L (ref 3–11)
BUN: 13.5 mg/dL (ref 7.0–26.0)
CO2: 25 mEq/L (ref 22–29)
Calcium: 8.8 mg/dL (ref 8.4–10.4)
Chloride: 104 mEq/L (ref 98–109)
Creatinine: 1 mg/dL (ref 0.7–1.3)
Glucose: 226 mg/dl — ABNORMAL HIGH (ref 70–140)
POTASSIUM: 4.1 meq/L (ref 3.5–5.1)
SODIUM: 134 meq/L — AB (ref 136–145)
Total Bilirubin: 0.57 mg/dL (ref 0.20–1.20)
Total Protein: 6.9 g/dL (ref 6.4–8.3)

## 2014-04-18 LAB — TECHNOLOGIST REVIEW

## 2014-04-18 MED ORDER — CIPROFLOXACIN HCL 500 MG PO TABS: 500.0000 mg | ORAL_TABLET | Freq: Two times a day (BID) | ORAL | Status: DC

## 2014-04-18 NOTE — Progress Notes (Signed)
[name] OFFICE PROGRESS NOTE   Diagnosis:  Multiple myeloma  INTERVAL HISTORY:   Mr. Haisley returns as scheduled. He began cycle 1 Pomalidomide/dexamethasone 04/01/2014. He denies nausea/vomiting. No mouth sores. He continues to have intermittent constipation. The constipation is relieved with prune juice. He denies fever. No cough. He continues to have dyspnea on exertion. He denies bleeding. Energy level is poor.  Objective:  Vital signs in last 24 hours:  Blood pressure 141/68, pulse 84, temperature 97.8 F (36.6 C), temperature source Oral, resp. rate 17, height 6' (1.829 m), weight 259 lb 12.8 oz (117.845 kg), SpO2 100.00%.    HEENT: No thrush or ulcers. Resp: Rales at both lung bases. No respiratory distress. Cardio: Regular rate and rhythm. GI: Abdomen soft and nontender. No hepatomegaly. Vascular: Trace lower leg edema bilaterally with chronic stasis changes.  Lab Results:  Lab Results  Component Value Date   WBC 2.0* 04/18/2014   HGB 10.2* 04/18/2014   HCT 30.3* 04/18/2014   MCV 91.9 04/18/2014   PLT 42* 04/18/2014   NEUTROABS 0.4* 04/18/2014    Imaging:  No results found.  Medications: I have reviewed the patient's current medications.  Assessment/Plan: 1. Multiple myeloma, status post treatment with melphalan/prednisone/thalidomide beginning in November 2009. The thalidomide was increased to 200 mg daily beginning 09/11/2008. The serum M spike was slightly improved on 03/18/2009 and slightly increased on 05/09/2009. He has been maintained off of specific therapy for multiple myeloma since September 2010. The serum M spike was increased on 10/20/2011 at 4.6. He began Revlimid on 11/11/2011 with weekly dexamethasone. He began cycle 2 of Revlimid on 12/09/2011. The serum M spike was improved on 12/31/2011. The serum M spike was slightly higher on 01/28/2012. He began cycle 4 on 02/03/2012. He developed recurrent hypercalcemia. Treatment was changed to  Cytoxan/Velcade/Decadron beginning 03/02/2012. Cytoxan discontinued in January 2015 Treatment continued with Velcade/Decadron.  Stable serum M spike and IgG 10/04/2013.  Stable serum M spike 12/06/2013  Treatment placed on hold 01/10/2014 secondary to thrombocytopenia and a slightly higher M spike  Serum M spike and IgG increased 03/07/2014. Cycle 1 Pomalidomide/dexamethasone 04/01/2014. 2. Chronic "dizziness."  3. Chest x-ray 11/01/2008 with a patchy opacity at the right midlung suspicious for pneumonia. He was treated with Avelox. 4. Low back pain with a radicular component. An MRI on 10/07/2008 showed no involvement of the lumbosacral spine with myeloma. 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 5. Bilateral neck pain, status post a CT 05/17/2008 with findings of spinal stenosis and osteoarthritis. 6. 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 Red cell microcytosis, likely related to hemoglobin C trait. A ferritin level returned elevated and a stool Hemoccult was negative 01/23/2010. He underwent a colonoscopy in 2009. 8. Elevated beta-2 microglobulin level. 9. History of hypertension. 10. History of a herniated disk at L4-L5 on MRI an 01/11/2002. 11. Hyperlipidemia. 12. Gastroesophageal reflux disease. 13. History of atypical angina. 14. History of rectal bleeding-large external hemorrhoids noted 02/25/2012. The stool was Hemoccult positive. He has a history of colon polyps. He is followed by [facility]. He was diagnosed with hemorrhoids in September of 2013 while hospitalized. He underwent a band procedure 03/09/2013. He has had no further rectal bleeding. 15. Superficial venous thrombosis of the greater saphenous vein on a Doppler 09/27/2008. Negative for deep vein thrombosis. Symptoms improved with aspirin. 16. Neck pain with  numbness/tingling in the arms,  hands, and low back with proximal right leg weakness. An MRI of the cervical spine 05/30/2009 showed multilevel spondylosis with mild cord edema at C4-C5 and C5-C6. He was noted to have an enhancing lesion of the cord at T3-T4 of unclear etiology. In the lumbar spine there was no change in the spondylosis at L3-L4 and L4-L5. There was no involvement of the cervical or lumbar spine with myeloma. He is status post C4-C5, C5-C6, and C6-C7 anterior cervical diskectomy with fusion by Dr. Annette Stable 08/01/2009. 17. Thrombocytopenia-progressive, likely secondary to progression of multiple myeloma 18. Indeterminate-age deep vein thrombosis of the left lower extremity on a venous Doppler 08/15/2009. There were also findings consistent with superficial thrombosis involving the right lower extremity 08/15/2009. Coumadin was discontinued in October 2011. 19. Type 2 diabetes. 20. History of hematuria, followed at [facility]. Urinalysis was negative for blood on 07/23/2011. 21. Radicular back pain with MRI of the lumbar spine on 10/19/2011 showing nerve root impingement at L2-L3, L3-L4 and L4-L5 with the most severe at the right L3 nerve roots due to a large disc fragment. Associated right leg weakness. He underwent right L2-3 decompressive laminotomy with right L2 and L3 decompressive foraminotomy; right L2-3 microdiscectomy on 11/01/2011. 22. Constipation. He continues a laxative regimen. 23. Hospitalization 11/08/2011 through 11/10/2011 with orthostatic hypotension/dizziness. He improved with intravenous hydration. He had mild hypotension when here on 11/18/2011. We recommended discontinuing Norvasc and Proscar. 24. Hypercalcemia status post pamidronate 11/03/2011. Recurrent hypercalcemia 02/16/2012 status post Zometa. The calcium has remained in normal range. 25. Fractured tooth with associated pain. Status post a tooth extraction by Dr. Enrique Sack, Zometa was placed on  hold. 26. Exertional dyspnea. Stable. He was referred to cardiology, pulmonary function studies 06/15/2013 showed a moderately severe diffusion defect. He utilizes supplemental oxygen as needed. He is followed by pulmonary. 27. 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    Disposition: [name] appears unchanged. He began cycle one Pomalidomide/dexamethasone 04/01/2014. He has progressive neutropenia and thrombocytopenia. He will hold further Pomalidomide and return for a followup CBC on 04/22/2014. He will begin ciprofloxacin 500 mg twice daily. He was instructed to contact the office with fever, shaking chills, other signs of infection, spontaneous bruising and/or bleeding.   We scheduled a followup appointment in approximately one month. We will give him further instructions regarding the next cycle of Pomalidomide pending recovery of his blood counts.   Plan reviewed with Dr. Benay Spice.   Ned Card ANP/GNP-BC   04/18/2014  3:38 PM

## 2014-04-18 NOTE — Patient Instructions (Signed)
STOP Pomalidomide Return for repeat CBC on 10/12. Wait in lobby for results. Call with fever, shaking chills, signs of infection, spontaneous bruising and/or bleeding.

## 2014-04-18 NOTE — Telephone Encounter (Signed)
gva dn printed appt sched and avs for pt for OCT and NOV    °

## 2014-04-22 ENCOUNTER — Telehealth: Payer: Self-pay | Admitting: *Deleted

## 2014-04-22 ENCOUNTER — Other Ambulatory Visit (HOSPITAL_BASED_OUTPATIENT_CLINIC_OR_DEPARTMENT_OTHER): Payer: Medicare HMO

## 2014-04-22 DIAGNOSIS — C9000 Multiple myeloma not having achieved remission: Secondary | ICD-10-CM

## 2014-04-22 DIAGNOSIS — D709 Neutropenia, unspecified: Secondary | ICD-10-CM

## 2014-04-22 LAB — CBC WITH DIFFERENTIAL/PLATELET
BASO%: 0 % (ref 0.0–2.0)
Basophils Absolute: 0 10*3/uL (ref 0.0–0.1)
EOS%: 4 % (ref 0.0–7.0)
Eosinophils Absolute: 0.1 10*3/uL (ref 0.0–0.5)
HEMATOCRIT: 28 % — AB (ref 38.4–49.9)
HEMOGLOBIN: 10.3 g/dL — AB (ref 13.0–17.1)
LYMPH#: 2 10*3/uL (ref 0.9–3.3)
LYMPH%: 73.5 % — AB (ref 14.0–49.0)
MCH: 31 pg (ref 27.2–33.4)
MCHC: 36.8 g/dL — ABNORMAL HIGH (ref 32.0–36.0)
MCV: 84.3 fL (ref 79.3–98.0)
MONO#: 0.2 10*3/uL (ref 0.1–0.9)
MONO%: 6.6 % (ref 0.0–14.0)
NEUT#: 0.4 10*3/uL — CL (ref 1.5–6.5)
NEUT%: 15.9 % — AB (ref 39.0–75.0)
RBC: 3.32 10*6/uL — ABNORMAL LOW (ref 4.20–5.82)
RDW: 19.7 % — ABNORMAL HIGH (ref 11.0–14.6)
WBC: 2.7 10*3/uL — AB (ref 4.0–10.3)

## 2014-04-22 LAB — TECHNOLOGIST REVIEW

## 2014-04-22 NOTE — Telephone Encounter (Signed)
Pt waiting in lobby for Dr. Benay Spice to reviewed lab results.  Per Dr. Benay Spice; notified pt and wife that labs are stable at this time and to continue taking Cipro to help prevent any infections, call for fever 100.5 or greater; call for bleeding that will not stop; neutropenic precautions reviewed.  Instructed pt that MD wants to check labs again this Thursday and scheduler will call with time.  Pt and wife verbalized understanding and also wants me to tell Dr. Benay Spice that he is having night sweats.  MD made aware.

## 2014-04-23 ENCOUNTER — Telehealth: Payer: Self-pay | Admitting: Oncology

## 2014-04-23 LAB — PROTEIN ELECTROPHORESIS, SERUM
ALPHA-2-GLOBULIN: 10.4 % (ref 7.1–11.8)
Albumin ELP: 49.6 % — ABNORMAL LOW (ref 55.8–66.1)
Alpha-1-Globulin: 7.1 % — ABNORMAL HIGH (ref 2.9–4.9)
BETA 2: 3 % — AB (ref 3.2–6.5)
Beta Globulin: 5.2 % (ref 4.7–7.2)
Gamma Globulin: 24.7 % — ABNORMAL HIGH (ref 11.1–18.8)
M-Spike, %: 1.17 g/dL
TOTAL PROTEIN, SERUM ELECTROPHOR: 6.9 g/dL (ref 6.0–8.3)

## 2014-04-23 LAB — IGG: IgG (Immunoglobin G), Serum: 1690 mg/dL — ABNORMAL HIGH (ref 650–1600)

## 2014-04-23 NOTE — Telephone Encounter (Signed)
Called pt per 10/12 POF  pt confirmed 10/15 labs only..... [name]

## 2014-04-24 ENCOUNTER — Other Ambulatory Visit: Payer: Self-pay | Admitting: *Deleted

## 2014-04-24 NOTE — Telephone Encounter (Signed)
Received refill request from [facility] for Pomalyst 4 mg-currently on hold pending counts on 04/25/14. Script held at collaborative nurse desk pending labs.

## 2014-04-25 ENCOUNTER — Other Ambulatory Visit (HOSPITAL_BASED_OUTPATIENT_CLINIC_OR_DEPARTMENT_OTHER): Payer: Medicare HMO

## 2014-04-25 ENCOUNTER — Other Ambulatory Visit: Payer: Self-pay | Admitting: Medical Oncology

## 2014-04-25 ENCOUNTER — Telehealth: Payer: Self-pay | Admitting: Medical Oncology

## 2014-04-25 DIAGNOSIS — C9000 Multiple myeloma not having achieved remission: Secondary | ICD-10-CM

## 2014-04-25 LAB — CBC WITH DIFFERENTIAL/PLATELET
BASO%: 1.5 % (ref 0.0–2.0)
BASOS ABS: 0 10*3/uL (ref 0.0–0.1)
EOS%: 1.3 % (ref 0.0–7.0)
Eosinophils Absolute: 0 10*3/uL (ref 0.0–0.5)
HCT: 28.3 % — ABNORMAL LOW (ref 38.4–49.9)
HEMOGLOBIN: 9.8 g/dL — AB (ref 13.0–17.1)
LYMPH%: 71.2 % — AB (ref 14.0–49.0)
MCH: 31.6 pg (ref 27.2–33.4)
MCHC: 34.5 g/dL (ref 32.0–36.0)
MCV: 91.5 fL (ref 79.3–98.0)
MONO#: 0.2 10*3/uL (ref 0.1–0.9)
MONO%: 8.3 % (ref 0.0–14.0)
NEUT%: 17.7 % — ABNORMAL LOW (ref 39.0–75.0)
NEUTROS ABS: 0.5 10*3/uL — AB (ref 1.5–6.5)
PLATELETS: 34 10*3/uL — AB (ref 140–400)
RBC: 3.09 10*6/uL — ABNORMAL LOW (ref 4.20–5.82)
RDW: 18.3 % — ABNORMAL HIGH (ref 11.0–14.6)
WBC: 3 10*3/uL — ABNORMAL LOW (ref 4.0–10.3)
lymph#: 2.1 10*3/uL (ref 0.9–3.3)

## 2014-04-25 LAB — TECHNOLOGIST REVIEW

## 2014-04-25 NOTE — Telephone Encounter (Signed)
I called pt and he asked me to talk with his wife. I informed her Dr. Benay Spice does not want him to restart the pomalyst. I explained that his blood counts are still too low. She voiced understanding. I also let her know our office will call to set up an appointment to check labs next Tues. 04/30/14. POF made

## 2014-04-26 ENCOUNTER — Telehealth: Payer: Self-pay | Admitting: Oncology

## 2014-04-26 ENCOUNTER — Other Ambulatory Visit: Payer: Self-pay | Admitting: *Deleted

## 2014-04-26 NOTE — Telephone Encounter (Signed)
s.w. pt wife and advised on 10.20 alab....ok and aware

## 2014-04-26 NOTE — Telephone Encounter (Signed)
THIS REFILL REQUEST FOR POMALYST WAS GIVEN TO DR.SHERRILL'S NURSE, SUSAN COWARD,RN. PT.'S COUNTS ARE LOW SO MEDICATION IS ON HOLD. NOTIFIED [facility].

## 2014-04-30 ENCOUNTER — Telehealth: Payer: Self-pay | Admitting: *Deleted

## 2014-04-30 ENCOUNTER — Other Ambulatory Visit (HOSPITAL_BASED_OUTPATIENT_CLINIC_OR_DEPARTMENT_OTHER): Payer: Medicare HMO

## 2014-04-30 DIAGNOSIS — C9000 Multiple myeloma not having achieved remission: Secondary | ICD-10-CM

## 2014-04-30 DIAGNOSIS — D61818 Other pancytopenia: Secondary | ICD-10-CM

## 2014-04-30 LAB — MANUAL DIFFERENTIAL
ALC: 0.5 10*3/uL — AB (ref 0.9–3.3)
ANC (CHCC manual diff): 1 10*3/uL — ABNORMAL LOW (ref 1.5–6.5)
Band Neutrophils: 0 % (ref 0–10)
Basophil: 0 % (ref 0–2)
Blasts: 0 % (ref 0–0)
EOS%: 0 % (ref 0–7)
LYMPH: 30 % (ref 14–49)
MONO: 2 % (ref 0–14)
MYELOCYTES: 0 % (ref 0–0)
Metamyelocytes: 0 % (ref 0–0)
Other Cell: 0 % (ref 0–0)
PLT EST: DECREASED
PROMYELO: 0 % (ref 0–0)
SEG: 68 % (ref 38–77)
Variant Lymph: 0 % (ref 0–0)
nRBC: 0 % (ref 0–0)

## 2014-04-30 LAB — CBC WITH DIFFERENTIAL/PLATELET
HCT: 30.4 % — ABNORMAL LOW (ref 38.4–49.9)
HGB: 10.2 g/dL — ABNORMAL LOW (ref 13.0–17.1)
MCH: 31.2 pg (ref 27.2–33.4)
MCHC: 33.6 g/dL (ref 32.0–36.0)
MCV: 92.9 fL (ref 79.3–98.0)
Platelets: 56 10*3/uL — ABNORMAL LOW (ref 140–400)
RBC: 3.27 10*6/uL — AB (ref 4.20–5.82)
RDW: 18.5 % — ABNORMAL HIGH (ref 11.0–14.6)
WBC: 1.5 10*3/uL — AB (ref 4.0–10.3)

## 2014-04-30 NOTE — Telephone Encounter (Signed)
Per Dr. Benay Spice : counts better, but continue to hold pomalyst. Will recheck labs next week. Wants platelets > 90 to resume (may lower dose). Unable to reach patient at home # and no voice mail. Will try again tomorrow.

## 2014-05-01 ENCOUNTER — Telehealth: Payer: Self-pay | Admitting: Oncology

## 2014-05-01 ENCOUNTER — Telehealth: Payer: Self-pay | Admitting: *Deleted

## 2014-05-01 NOTE — Telephone Encounter (Signed)
r/s pt appt to 10.27.Marland KitchenMarland KitchenMarland Kitchenpt ok and aware

## 2014-05-01 NOTE — Telephone Encounter (Signed)
Spoke with pt's wife, she understands he should continue to [place]. Informed her of need for repeat lab in one week, connected her to scheduling to arrange time.

## 2014-05-01 NOTE — Telephone Encounter (Signed)
, °

## 2014-05-03 ENCOUNTER — Other Ambulatory Visit: Payer: Self-pay

## 2014-05-07 ENCOUNTER — Other Ambulatory Visit (HOSPITAL_BASED_OUTPATIENT_CLINIC_OR_DEPARTMENT_OTHER): Payer: Medicare HMO

## 2014-05-07 DIAGNOSIS — C9000 Multiple myeloma not having achieved remission: Secondary | ICD-10-CM

## 2014-05-07 DIAGNOSIS — D61818 Other pancytopenia: Secondary | ICD-10-CM

## 2014-05-07 LAB — MANUAL DIFFERENTIAL
ALC: 0.5 10*3/uL — AB (ref 0.9–3.3)
ANC (CHCC manual diff): 1.8 10*3/uL (ref 1.5–6.5)
BASOPHIL: 1 % (ref 0–2)
BLASTS: 0 % (ref 0–0)
Band Neutrophils: 0 % (ref 0–10)
EOS: 0 % (ref 0–7)
LYMPH: 20 % (ref 14–49)
METAMYELOCYTES PCT: 0 % (ref 0–0)
MONO: 2 % (ref 0–14)
MYELOCYTES: 0 % (ref 0–0)
Other Cell: 0 % (ref 0–0)
PLT EST: DECREASED
PROMYELO: 0 % (ref 0–0)
SEG: 77 % (ref 38–77)
Variant Lymph: 0 % (ref 0–0)
nRBC: 0 % (ref 0–0)

## 2014-05-07 LAB — CBC WITH DIFFERENTIAL/PLATELET
HCT: 30.5 % — ABNORMAL LOW (ref 38.4–49.9)
HGB: 10.2 g/dL — ABNORMAL LOW (ref 13.0–17.1)
MCH: 31.6 pg (ref 27.2–33.4)
MCHC: 33.5 g/dL (ref 32.0–36.0)
MCV: 94.4 fL (ref 79.3–98.0)
Platelets: 65 10*3/uL — ABNORMAL LOW (ref 140–400)
RBC: 3.23 10*6/uL — AB (ref 4.20–5.82)
RDW: 18.4 % — AB (ref 11.0–14.6)
WBC: 2.3 10*3/uL — ABNORMAL LOW (ref 4.0–10.3)

## 2014-05-09 ENCOUNTER — Telehealth: Payer: Self-pay | Admitting: *Deleted

## 2014-05-09 NOTE — Telephone Encounter (Signed)
Spoke with pt's wife. She confirms pt is HOLDING Pomalyst. He has a full 21 day supply as chemo was held last cycle. She reports pt is taking Decadron 20 mg weekly. Reviewed with Dr. Benay Spice: Check lab next week. Mrs. Kennon Rounds voiced understanding. Order sent to schedulers.

## 2014-05-14 ENCOUNTER — Telehealth: Payer: Self-pay

## 2014-05-14 ENCOUNTER — Encounter: Payer: Self-pay | Admitting: Family Medicine

## 2014-05-14 ENCOUNTER — Telehealth: Payer: Self-pay | Admitting: *Deleted

## 2014-05-14 ENCOUNTER — Ambulatory Visit (INDEPENDENT_AMBULATORY_CARE_PROVIDER_SITE_OTHER): Payer: Medicare HMO | Admitting: Family Medicine

## 2014-05-14 ENCOUNTER — Other Ambulatory Visit (HOSPITAL_BASED_OUTPATIENT_CLINIC_OR_DEPARTMENT_OTHER): Payer: Medicare HMO

## 2014-05-14 VITALS — BP 124/80 | HR 129 | Temp 96.5°F | Ht 72.0 in | Wt 259.0 lb

## 2014-05-14 DIAGNOSIS — R739 Hyperglycemia, unspecified: Secondary | ICD-10-CM

## 2014-05-14 DIAGNOSIS — D709 Neutropenia, unspecified: Secondary | ICD-10-CM

## 2014-05-14 DIAGNOSIS — C9000 Multiple myeloma not having achieved remission: Secondary | ICD-10-CM

## 2014-05-14 LAB — POCT URINALYSIS DIPSTICK
Bilirubin, UA: NEGATIVE
Blood, UA: NEGATIVE
Glucose, UA: 500
Ketones, UA: NEGATIVE
Leukocytes, UA: NEGATIVE
Nitrite, UA: NEGATIVE
Protein, UA: NEGATIVE
Spec Grav, UA: <=1.005
Urobilinogen, UA: NEGATIVE
pH, UA: 6

## 2014-05-14 LAB — COMPREHENSIVE METABOLIC PANEL (CC13)
ALT: 12 U/L (ref 0–55)
AST: 12 U/L (ref 5–34)
Albumin: 3.4 g/dL — ABNORMAL LOW (ref 3.5–5.0)
Alkaline Phosphatase: 64 U/L (ref 40–150)
Anion Gap: 17 mEq/L — ABNORMAL HIGH (ref 3–11)
BILIRUBIN TOTAL: 0.74 mg/dL (ref 0.20–1.20)
BUN: 23.1 mg/dL (ref 7.0–26.0)
CO2: 14 meq/L — AB (ref 22–29)
Calcium: 9.3 mg/dL (ref 8.4–10.4)
Chloride: 101 mEq/L (ref 98–109)
Creatinine: 1.5 mg/dL — ABNORMAL HIGH (ref 0.7–1.3)
Glucose: 502 mg/dl — ABNORMAL HIGH (ref 70–140)
Potassium: 4.6 mEq/L (ref 3.5–5.1)
SODIUM: 132 meq/L — AB (ref 136–145)
Total Protein: 7.8 g/dL (ref 6.4–8.3)

## 2014-05-14 LAB — CBC WITH DIFFERENTIAL/PLATELET
BASO%: 2.7 % — AB (ref 0.0–2.0)
Basophils Absolute: 0.1 10*3/uL (ref 0.0–0.1)
EOS%: 0.3 % (ref 0.0–7.0)
Eosinophils Absolute: 0 10*3/uL (ref 0.0–0.5)
HCT: 31 % — ABNORMAL LOW (ref 38.4–49.9)
HGB: 10.5 g/dL — ABNORMAL LOW (ref 13.0–17.1)
LYMPH%: 27 % (ref 14.0–49.0)
MCH: 32.1 pg (ref 27.2–33.4)
MCHC: 33.8 g/dL (ref 32.0–36.0)
MCV: 95 fL (ref 79.3–98.0)
MONO#: 0.2 10*3/uL (ref 0.1–0.9)
MONO%: 8.7 % (ref 0.0–14.0)
NEUT#: 1.7 10*3/uL (ref 1.5–6.5)
NEUT%: 61.3 % (ref 39.0–75.0)
Platelets: 108 10*3/uL — ABNORMAL LOW (ref 140–400)
RBC: 3.26 10*6/uL — AB (ref 4.20–5.82)
RDW: 18.1 % — AB (ref 11.0–14.6)
WBC: 2.8 10*3/uL — AB (ref 4.0–10.3)
lymph#: 0.7 10*3/uL — ABNORMAL LOW (ref 0.9–3.3)

## 2014-05-14 LAB — TECHNOLOGIST REVIEW: Technologist Review: 2

## 2014-05-14 LAB — GLUCOSE, POCT (MANUAL RESULT ENTRY): POC Glucose: 462 mg/dl — AB (ref 70–99)

## 2014-05-14 NOTE — Telephone Encounter (Signed)
Telephone call to pt wife and advised pt NTBS today due to elevated BS. Appt made for today at 3.

## 2014-05-14 NOTE — Progress Notes (Signed)
   Subjective:    Patient ID: [name], male    DOB: [birth year], 78 y.o.   MRN: [id number]  HPI Patient has hx of multiple myeloma and has been given decadron by his oncologist.  His FSBS's have been very high and 506 on cmp. He has fsbs of 462 here in clinic.  He is weak.  He is sweating.  Review of Systems  Constitutional: Negative for fever.  HENT: Negative for ear pain.   Eyes: Negative for discharge.  Respiratory: Negative for cough.   Cardiovascular: Negative for chest pain.  Gastrointestinal: Negative for abdominal distention.  Endocrine: Negative for polyuria.  Genitourinary: Negative for difficulty urinating.  Musculoskeletal: Negative for gait problem and neck pain.  Skin: Negative for color change and rash.  Neurological: Negative for speech difficulty and headaches.  Psychiatric/Behavioral: Negative for agitation.       Objective:    BP 124/80 mmHg  Pulse 129  Temp(Src) 96.5 F (35.8 C) (Oral)  Ht 6' (1.829 m)  Wt 259 lb (117.482 kg)  BMI 35.12 kg/m2   Physical Exam  Constitutional: He is oriented to person, place, and time. He appears well-developed and well-nourished.  HENT:  Head: Normocephalic and atraumatic.  Mouth/Throat: Oropharynx is clear and moist.  Eyes: Pupils are equal, round, and reactive to light.  Neck: Normal range of motion. Neck supple.  Cardiovascular: Normal rate and regular rhythm.   No murmur heard. Pulmonary/Chest: Effort normal and breath sounds normal.  Abdominal: Soft. Bowel sounds are normal. There is no tenderness.  Neurological: He is alert and oriented to person, place, and time.  Skin: Skin is warm and dry.  Psychiatric: He has a normal mood and affect.          Assessment & Plan:     ICD-9-CM ICD-10-CM   1. Hyperglycemia 790.29 R73.9 POCT glucose (manual entry)     POCT urinalysis dipstick   Patient given Novolog 20 units IM.  Invokana 377m po qd #20 given.  Patient advised to see TCherre RobinsPharm D for  diabetes visit.   No Follow-up on file.  WLysbeth PennerFNP

## 2014-05-14 NOTE — Telephone Encounter (Signed)
Pt blood glucose 502. Called and spoke with pt wife in concern to pt glucose levels. Pt last dose of decadron was yesterday 05/13/14, does not take any blood sugar medications. Informed her to call pt PCP with blood glucose levels and we would fax over results as well. Pt wife verbalized understanding. Faxed over lab results to [facility], Phone [phone], fax- [phone].

## 2014-05-16 LAB — PROTEIN ELECTROPHORESIS, SERUM
ALPHA-1-GLOBULIN: 4.6 % (ref 2.9–4.9)
Albumin ELP: 49 % — ABNORMAL LOW (ref 55.8–66.1)
Alpha-2-Globulin: 11 % (ref 7.1–11.8)
BETA 2: 4.7 % (ref 3.2–6.5)
Beta Globulin: 5 % (ref 4.7–7.2)
Gamma Globulin: 25.7 % — ABNORMAL HIGH (ref 11.1–18.8)
M-Spike, %: 1.47 g/dL
Total Protein, Serum Electrophoresis: 7.6 g/dL (ref 6.0–8.3)

## 2014-05-16 LAB — IGG: IgG (Immunoglobin G), Serum: 1930 mg/dL — ABNORMAL HIGH (ref 650–1600)

## 2014-05-17 ENCOUNTER — Other Ambulatory Visit: Payer: Self-pay | Admitting: *Deleted

## 2014-05-17 DIAGNOSIS — C9000 Multiple myeloma not having achieved remission: Secondary | ICD-10-CM

## 2014-05-20 ENCOUNTER — Other Ambulatory Visit (HOSPITAL_BASED_OUTPATIENT_CLINIC_OR_DEPARTMENT_OTHER): Payer: Medicare HMO

## 2014-05-20 ENCOUNTER — Telehealth: Payer: Self-pay | Admitting: Oncology

## 2014-05-20 ENCOUNTER — Other Ambulatory Visit: Payer: Self-pay | Admitting: Family Medicine

## 2014-05-20 ENCOUNTER — Telehealth: Payer: Self-pay | Admitting: *Deleted

## 2014-05-20 ENCOUNTER — Ambulatory Visit (HOSPITAL_BASED_OUTPATIENT_CLINIC_OR_DEPARTMENT_OTHER): Payer: Medicare HMO | Admitting: Oncology

## 2014-05-20 VITALS — BP 108/57 | HR 99 | Temp 97.1°F | Resp 18 | Ht 72.0 in | Wt 246.0 lb

## 2014-05-20 DIAGNOSIS — I10 Essential (primary) hypertension: Secondary | ICD-10-CM

## 2014-05-20 DIAGNOSIS — Z86718 Personal history of other venous thrombosis and embolism: Secondary | ICD-10-CM

## 2014-05-20 DIAGNOSIS — D6481 Anemia due to antineoplastic chemotherapy: Secondary | ICD-10-CM

## 2014-05-20 DIAGNOSIS — D630 Anemia in neoplastic disease: Secondary | ICD-10-CM

## 2014-05-20 DIAGNOSIS — C9000 Multiple myeloma not having achieved remission: Secondary | ICD-10-CM

## 2014-05-20 DIAGNOSIS — K5900 Constipation, unspecified: Secondary | ICD-10-CM

## 2014-05-20 DIAGNOSIS — E119 Type 2 diabetes mellitus without complications: Secondary | ICD-10-CM

## 2014-05-20 LAB — CBC WITH DIFFERENTIAL/PLATELET
BASO%: 0.6 % (ref 0.0–2.0)
Basophils Absolute: 0 10*3/uL (ref 0.0–0.1)
EOS%: 5.5 % (ref 0.0–7.0)
Eosinophils Absolute: 0.2 10*3/uL (ref 0.0–0.5)
HCT: 27.3 % — ABNORMAL LOW (ref 38.4–49.9)
HGB: 9.9 g/dL — ABNORMAL LOW (ref 13.0–17.1)
LYMPH#: 1.4 10*3/uL (ref 0.9–3.3)
LYMPH%: 46.3 % (ref 14.0–49.0)
MCH: 31.5 pg (ref 27.2–33.4)
MCHC: 36.3 g/dL — AB (ref 32.0–36.0)
MCV: 86.9 fL (ref 79.3–98.0)
MONO#: 0.3 10*3/uL (ref 0.1–0.9)
MONO%: 8.4 % (ref 0.0–14.0)
NEUT#: 1.2 10*3/uL — ABNORMAL LOW (ref 1.5–6.5)
NEUT%: 39.2 % (ref 39.0–75.0)
NRBC: 0 % (ref 0–0)
Platelets: 91 10*3/uL — ABNORMAL LOW (ref 140–400)
RBC: 3.14 10*6/uL — AB (ref 4.20–5.82)
RDW: 18.8 % — ABNORMAL HIGH (ref 11.0–14.6)
WBC: 3.1 10*3/uL — ABNORMAL LOW (ref 4.0–10.3)

## 2014-05-20 LAB — TECHNOLOGIST REVIEW

## 2014-05-20 MED ORDER — POMALIDOMIDE 2 MG PO CAPS: 2.0000 mg | ORAL_CAPSULE | Freq: Every day | ORAL | Status: DC

## 2014-05-20 MED ORDER — LACTULOSE 10 GM/15ML PO SOLN: 10.0000 g | Freq: Two times a day (BID) | ORAL | Status: DC | PRN

## 2014-05-20 NOTE — Telephone Encounter (Signed)
Called [facility] to request CMET to be drawn during pt's visit 11/10. They requested faxed order. Same done.

## 2014-05-20 NOTE — Progress Notes (Signed)
[place] OFFICE PROGRESS NOTE   Diagnosis: multiple myeloma  INTERVAL HISTORY:   Pomalidomide has been on hold for the past several weeks due to cytopenias.  The blood sugar returned markedly elevated last week and he saw his primary MD. He was started on an oral hypoglycemic.  [name] reports malaise, dizziness, and exertional dyspnea.    Objective:  Vital signs in last 24 hours:  Blood pressure 108/57, pulse 99, temperature 97.1 F (36.2 C), temperature source Oral, resp. rate 18, height 6' (1.829 m), weight 246 lb (111.585 kg).    HEENT: no thrush Resp: distant BS, rhonchi at the posterior bases Cardio:  RRR GI: soft, nt, no hsm Vascular:  No leg edema   Lab Results:  Lab Results  Component Value Date   WBC 3.1* 05/20/2014   HGB 9.9* 05/20/2014   HCT 27.3* 05/20/2014   MCV 86.9 05/20/2014   PLT 91* 05/20/2014   NEUTROABS 1.2* 05/20/2014   05/14/2014- glucose 502, creatinine 1/5  Medications: I have reviewed the patient's current medications.  Assessment/Plan: 1. Multiple myeloma, status post treatment with melphalan/prednisone/thalidomide beginning in November 2009. The thalidomide was increased to 200 mg daily beginning 09/11/2008. The serum M spike was slightly improved on 03/18/2009 and slightly increased on 05/09/2009. He has been maintained off of specific therapy for multiple myeloma since September 2010. The serum M spike was increased on 10/20/2011 at 4.6. He began Revlimid on 11/11/2011 with weekly dexamethasone. He began cycle 2 of Revlimid on 12/09/2011. The serum M spike was improved on 12/31/2011. The serum M spike was slightly higher on 01/28/2012. He began cycle 4 on 02/03/2012. He developed recurrent hypercalcemia. Treatment was changed to Cytoxan/Velcade/Decadron beginning 03/02/2012. Cytoxan discontinued in January 2015  Treatment continued with Velcade/Decadron.   Stable serum M spike and IgG 10/04/2013.   Stable serum M  spike 12/06/2013   Treatment placed on hold 01/10/2014 secondary to thrombocytopenia and a slightly higher M spike   Serum M spike and IgG increased 03/07/2014.  Cycle 1 Pomalidomide/dexamethasone 04/01/2014. 2. Chronic "dizziness."  3. Chest x-ray 11/01/2008 with a patchy opacity at the right midlung suspicious for pneumonia. He was treated with Avelox. 4. Low back pain with a radicular component. An MRI on 10/07/2008 showed no involvement of the lumbosacral spine with myeloma. 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 5. Bilateral neck pain, status post a CT 05/17/2008 with findings of spinal stenosis and osteoarthritis. 6. 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 Red cell microcytosis, likely related to hemoglobin C trait. A ferritin level returned elevated and a stool Hemoccult was negative 01/23/2010. He underwent a colonoscopy in 2009. 8. Elevated beta-2 microglobulin level. 9. History of hypertension. 10. History of a herniated disk at L4-L5 on MRI an 01/11/2002. 11. Hyperlipidemia. 12. Gastroesophageal reflux disease. 13. History of atypical angina. 14. History of rectal bleeding-large external hemorrhoids noted 02/25/2012. The stool was Hemoccult positive. He has a history of colon polyps. He is followed by [facility]. He was diagnosed with hemorrhoids in September of 2013 while hospitalized. He underwent a band procedure 03/09/2013. He has had no further rectal bleeding. 15. Superficial venous thrombosis of the greater saphenous vein on a Doppler 09/27/2008. Negative for deep vein thrombosis. Symptoms improved with aspirin. 16. Neck pain with numbness/tingling in the arms, hands, and low back with proximal right leg weakness. An MRI of the cervical spine 05/30/2009  showed multilevel spondylosis with mild cord edema at C4-C5  and C5-C6. He was noted to have an enhancing lesion of the cord at T3-T4 of unclear etiology. In the lumbar spine there was no change in the spondylosis at L3-L4 and L4-L5. There was no involvement of the cervical or lumbar spine with myeloma. He is status post C4-C5, C5-C6, and C6-C7 anterior cervical diskectomy with fusion by Dr. Annette Stable 08/01/2009. 17. Thrombocytopenia-progressive, likely secondary to progression of multiple myeloma 18. Indeterminate-age deep vein thrombosis of the left lower extremity on a venous Doppler 08/15/2009. There were also findings consistent with superficial thrombosis involving the right lower extremity 08/15/2009. Coumadin was discontinued in October 2011. 19. Type 2 diabetes. Now on an oral hypoglycemic 20. History of hematuria, followed at [facility]. Urinalysis was negative for blood on 07/23/2011. 21. Radicular back pain with MRI of the lumbar spine on 10/19/2011 showing nerve root impingement at L2-L3, L3-L4 and L4-L5 with the most severe at the right L3 nerve roots due to a large disc fragment. Associated right leg weakness. He underwent right L2-3 decompressive laminotomy with right L2 and L3 decompressive foraminotomy; right L2-3 microdiscectomy on 11/01/2011. 22. Constipation. He continues a laxative regimen. 23. Hospitalization 11/08/2011 through 11/10/2011 with orthostatic hypotension/dizziness. He improved with intravenous hydration. He had mild hypotension when here on 11/18/2011. We recommended discontinuing Norvasc and Proscar. 24. Hypercalcemia status post pamidronate 11/03/2011. Recurrent hypercalcemia 02/16/2012 status post Zometa. The calcium has remained in normal range. 25. Fractured tooth with associated pain. Status post a tooth extraction by Dr. Enrique Sack, Zometa was placed on hold. 26. Exertional dyspnea. Stable. He was referred to cardiology, pulmonary function studies 06/15/2013 showed a moderately severe diffusion defect. He utilizes  supplemental oxygen as needed. He is followed by pulmonary. 27. 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 28. Neutropenia/thrombocytopenia after pomalidomide   Disposition:  [name] has completed only 1 cycle of pomalidomide.  Treatment was placed on hold due to cytopenias.  The platelets and neutrophils have recovered.  He will  Begin another cycle with a dose reduction. We will hold decadron due to the hyperglycemia.  We will ask his primary MD to check a chemistry panel 11/10.  He will return for an office visit in 2 weeks.  He will try lactulose for the constipation.    Betsy Coder, MD  05/20/2014  11:26 AM

## 2014-05-20 NOTE — Telephone Encounter (Signed)
Gave avs & cal for Nov.  °

## 2014-05-21 ENCOUNTER — Ambulatory Visit (INDEPENDENT_AMBULATORY_CARE_PROVIDER_SITE_OTHER): Payer: Medicare HMO | Admitting: Pharmacist Clinician (PhC)/ Clinical Pharmacy Specialist

## 2014-05-21 ENCOUNTER — Telehealth: Payer: Self-pay | Admitting: *Deleted

## 2014-05-21 ENCOUNTER — Encounter: Payer: Self-pay | Admitting: *Deleted

## 2014-05-21 DIAGNOSIS — N2889 Other specified disorders of kidney and ureter: Secondary | ICD-10-CM

## 2014-05-21 DIAGNOSIS — C9000 Multiple myeloma not having achieved remission: Secondary | ICD-10-CM

## 2014-05-21 DIAGNOSIS — N08 Glomerular disorders in diseases classified elsewhere: Principal | ICD-10-CM

## 2014-05-21 MED ORDER — CANAGLIFLOZIN 100 MG PO TABS: 100.0000 mg | ORAL_TABLET | Freq: Every day | ORAL | Status: DC

## 2014-05-21 NOTE — Telephone Encounter (Signed)
Message from [place] pt answered survey question incorrectly. Attempted to call pt to review Pomalyst instructions. No answer.

## 2014-05-21 NOTE — Progress Notes (Signed)
   Subjective:    Patient ID: [name], male    DOB: 24-Jan-1936, 78 y.o.   MRN: [id number]  HPI:  New onset of type 2 Diabetes with family history, induced by decadron treatment which was stopped 1 week ago.    Review of Systems  Constitutional: Negative.   Musculoskeletal: Positive for gait problem.  Neurological: Positive for dizziness and light-headedness.  Psychiatric/Behavioral: Positive for sleep disturbance.       Objective:   Physical Exam  Constitutional: He is oriented to person, place, and time. He appears well-developed and well-nourished.  Neurological: He is alert and oriented to person, place, and time.  Skin: Skin is warm and dry.          Assessment & Plan:   Diabetes Follow-Up Visit Chief Complaint:  No chief complaint on file.    Mood/Affect:  normal  HPI:  Type 2 Diabetes new onset  Low fat/carbohydrate diet?  No Nicotine Abuse?  No Medication Compliance?  Yes Exercise?  No Alcohol Abuse?  No  Home BG Monitoring:  Checking 0 times a day. Average:     High:    Low:    Blood Glucose in office today is 182mg /dL  A1C in office is 10%   Lab Results  Component Value Date   HGBA1C 7.2% 12/10/2013    No results found for: Derl Barrow  Lab Results  Component Value Date   CHOL 138 08/07/2013   HDL 54 12/10/2013   LDLCALC 89 12/10/2013   TRIG 132 12/10/2013   CHOLHDL 3.1 12/10/2013      Assessment: 1.  Diabetes.  Poor control but improving 2.  Blood Pressure.  Not elevated 3.  Lipids:  Multiple myleoma patient 4.  Foot Care.  reviewed 5.  Dental Care.  reviewed 6.  Eye Care/Exam.  annual  Recommendations: 1.  Patient is counseled on appropriate foot care. 2.  BP goal < 130/80. 3.  LDL goal of < 100, HDL > 40 and TG < 150. 4.  Eye Exam yearly and Dental Exam every 6 months. 5.  Dietary recommendations:  1700 calorie CHO counting diet.  Extensively counseled patient on eating with diabetes. 6.  Physical Activity  recommendations:  10 minutes as tolerated with chair exercises 7.  Medication recommendations at this time are as follows:  Change Invokana from 300mg  to 100mg  based on patient's renal function.  Will need to follow BMP closely.  Patient will increase sodium in diet based on low sodium on recent labs and dizziness.  Gave home glucometer to check 1-2 daily and call if less than 80 or above 150. 8.  Return to clinic in 4-6 wks   Time spent counseling patient:  45 minutes  Physician time spent with patient:  oxford Referring provider:  oxford   PharmD:  Memory Argue, [facility]

## 2014-05-21 NOTE — Progress Notes (Signed)
RECEIVED A FAX FROM [facility] CONCERNING A CONFIRMATION OF FACSIMILE RECEIPT FOR PT. REFERRAL. 

## 2014-05-22 LAB — CMP14+EGFR
ALT: 9 IU/L (ref 0–44)
AST: 13 IU/L (ref 0–40)
Albumin/Globulin Ratio: 1.1 (ref 1.1–2.5)
Albumin: 4 g/dL (ref 3.5–4.8)
Alkaline Phosphatase: 61 IU/L (ref 39–117)
BILIRUBIN TOTAL: 0.7 mg/dL (ref 0.0–1.2)
BUN / CREAT RATIO: 19 (ref 10–22)
BUN: 24 mg/dL (ref 8–27)
CALCIUM: 8.9 mg/dL (ref 8.6–10.2)
CO2: 19 mmol/L (ref 18–29)
CREATININE: 1.24 mg/dL (ref 0.76–1.27)
Chloride: 96 mmol/L — ABNORMAL LOW (ref 97–108)
GFR calc Af Amer: 65 mL/min/{1.73_m2} (ref >59)
GFR, EST NON AFRICAN AMERICAN: 56 mL/min/{1.73_m2} — AB (ref >59)
Globulin, Total: 3.7 g/dL (ref 1.5–4.5)
Glucose: 157 mg/dL — ABNORMAL HIGH (ref 65–99)
POTASSIUM: 4.2 mmol/L (ref 3.5–5.2)
Sodium: 135 mmol/L (ref 134–144)
Total Protein: 7.7 g/dL (ref 6.0–8.5)

## 2014-05-22 NOTE — Telephone Encounter (Signed)
-----   Message from Ladell Pier, MD sent at 05/21/2014  6:40 PM EST ----- Please call patient, glucose and kidney function are better, start pomalyst at reduced dose when received

## 2014-05-22 NOTE — Telephone Encounter (Signed)
Spoke with pt's wife, Pomalyst teaching reviewed. She voiced understanding. Clarified with [employer] [facility] [employer], notified [facility] OK to dispense.

## 2014-05-23 ENCOUNTER — Encounter: Payer: Self-pay | Admitting: *Deleted

## 2014-05-23 ENCOUNTER — Telehealth: Payer: Self-pay | Admitting: *Deleted

## 2014-05-23 NOTE — Progress Notes (Signed)
RECEIVED A FAX FROM [facility] CONCERNING A CONFIRMATION OF PRESCRIPTION SHIPMENT FOR POMALYST ON 05/22/14.

## 2014-05-23 NOTE — Telephone Encounter (Signed)
Call from pt's wife: He began Pomalyst today.

## 2014-05-25 ENCOUNTER — Other Ambulatory Visit: Payer: Self-pay | Admitting: Family Medicine

## 2014-05-28 ENCOUNTER — Other Ambulatory Visit: Payer: Self-pay | Admitting: *Deleted

## 2014-05-28 ENCOUNTER — Other Ambulatory Visit: Payer: Self-pay | Admitting: Family Medicine

## 2014-05-31 DIAGNOSIS — Z0289 Encounter for other administrative examinations: Secondary | ICD-10-CM

## 2014-06-04 ENCOUNTER — Other Ambulatory Visit: Payer: Self-pay | Admitting: *Deleted

## 2014-06-04 ENCOUNTER — Ambulatory Visit (HOSPITAL_COMMUNITY)
Admission: RE | Admit: 2014-06-04 | Discharge: 2014-06-04 | Disposition: A | Discharge status: Home / Self Care | Payer: Medicare HMO | Source: Ambulatory Visit | Attending: Nurse Practitioner | Admitting: Nurse Practitioner

## 2014-06-04 ENCOUNTER — Other Ambulatory Visit (HOSPITAL_BASED_OUTPATIENT_CLINIC_OR_DEPARTMENT_OTHER): Payer: Medicare HMO

## 2014-06-04 ENCOUNTER — Ambulatory Visit: Payer: Medicare HMO

## 2014-06-04 ENCOUNTER — Ambulatory Visit (HOSPITAL_BASED_OUTPATIENT_CLINIC_OR_DEPARTMENT_OTHER): Payer: Medicare HMO

## 2014-06-04 ENCOUNTER — Ambulatory Visit (HOSPITAL_BASED_OUTPATIENT_CLINIC_OR_DEPARTMENT_OTHER): Payer: Medicare HMO | Admitting: Nurse Practitioner

## 2014-06-04 VITALS — BP 101/54 | HR 85 | Temp 98.6°F | Resp 18

## 2014-06-04 VITALS — BP 112/50 | HR 98 | Temp 98.3°F | Resp 19 | Ht 72.0 in | Wt 249.0 lb

## 2014-06-04 DIAGNOSIS — D649 Anemia, unspecified: Secondary | ICD-10-CM

## 2014-06-04 DIAGNOSIS — T451X5A Adverse effect of antineoplastic and immunosuppressive drugs, initial encounter: Principal | ICD-10-CM

## 2014-06-04 DIAGNOSIS — C9000 Multiple myeloma not having achieved remission: Secondary | ICD-10-CM

## 2014-06-04 DIAGNOSIS — D61810 Antineoplastic chemotherapy induced pancytopenia: Secondary | ICD-10-CM

## 2014-06-04 DIAGNOSIS — D62 Acute posthemorrhagic anemia: Secondary | ICD-10-CM

## 2014-06-04 LAB — CBC WITH DIFFERENTIAL/PLATELET
BASO%: 0.8 % (ref 0.0–2.0)
BASOS ABS: 0 10*3/uL (ref 0.0–0.1)
EOS%: 10.1 % — AB (ref 0.0–7.0)
Eosinophils Absolute: 0.3 10*3/uL (ref 0.0–0.5)
HCT: 21.3 % — ABNORMAL LOW (ref 38.4–49.9)
HGB: 7.7 g/dL — ABNORMAL LOW (ref 13.0–17.1)
LYMPH#: 1.2 10*3/uL (ref 0.9–3.3)
LYMPH%: 48.2 % (ref 14.0–49.0)
MCH: 32 pg (ref 27.2–33.4)
MCHC: 36.2 g/dL — AB (ref 32.0–36.0)
MCV: 88.4 fL (ref 79.3–98.0)
MONO#: 0.2 10*3/uL (ref 0.1–0.9)
MONO%: 9.3 % (ref 0.0–14.0)
NEUT#: 0.8 10*3/uL — ABNORMAL LOW (ref 1.5–6.5)
NEUT%: 31.6 % — ABNORMAL LOW (ref 39.0–75.0)
NRBC: 1 % — AB (ref 0–0)
Platelets: 46 10*3/uL — ABNORMAL LOW (ref 140–400)
RBC: 2.41 10*6/uL — AB (ref 4.20–5.82)
RDW: 18.5 % — AB (ref 11.0–14.6)
WBC: 2.6 10*3/uL — ABNORMAL LOW (ref 4.0–10.3)

## 2014-06-04 LAB — COMPREHENSIVE METABOLIC PANEL (CC13)
ALT: 7 U/L (ref 0–55)
AST: 14 U/L (ref 5–34)
Albumin: 3.1 g/dL — ABNORMAL LOW (ref 3.5–5.0)
Alkaline Phosphatase: 61 U/L (ref 40–150)
Anion Gap: 9 mEq/L (ref 3–11)
BILIRUBIN TOTAL: 1.14 mg/dL (ref 0.20–1.20)
BUN: 18.8 mg/dL (ref 7.0–26.0)
CO2: 21 mEq/L — ABNORMAL LOW (ref 22–29)
Calcium: 9.3 mg/dL (ref 8.4–10.4)
Chloride: 103 mEq/L (ref 98–109)
Creatinine: 1.4 mg/dL — ABNORMAL HIGH (ref 0.7–1.3)
GLUCOSE: 153 mg/dL — AB (ref 70–140)
POTASSIUM: 4.3 meq/L (ref 3.5–5.1)
Sodium: 133 mEq/L — ABNORMAL LOW (ref 136–145)
TOTAL PROTEIN: 7.3 g/dL (ref 6.4–8.3)

## 2014-06-04 LAB — PREPARE RBC (CROSSMATCH)

## 2014-06-04 LAB — TECHNOLOGIST REVIEW

## 2014-06-04 MED ORDER — SODIUM CHLORIDE 0.9 % IV SOLN
250.0000 mL | Freq: Once | INTRAVENOUS | Status: AC
  Administered 2014-06-04: 250 mL via INTRAVENOUS

## 2014-06-04 NOTE — Patient Instructions (Signed)

## 2014-06-04 NOTE — Progress Notes (Addendum)
[place] OFFICE PROGRESS NOTE   Diagnosis:  Multiple myeloma  INTERVAL HISTORY:   [name] returns as scheduled. He resumed Pomalidomide on 05/23/2014. He denies nausea/vomiting. No mouth sores. No diarrhea. He reports increased shortness of breath with minimal exertion. No fever or cough. He has periodic "hemorrhoid" bleeding.  Objective:  Vital signs in last 24 hours:  Blood pressure 112/50, pulse 98, temperature 98.3 F (36.8 C), temperature source Oral, resp. rate 19, height 6' (1.829 m), weight 249 lb (112.946 kg).    HEENT: no thrush or ulcers. Conjunctival pallor. Resp: rhonchi at the bilateral lung bases. Distant breath sounds. Cardio: regular rate and rhythm. GI: abdomen soft and nontender. No organomegaly. Vascular: no leg edema.     Lab Results:  Lab Results  Component Value Date   WBC 2.6* 06/04/2014   HGB 7.7* 06/04/2014   HCT 21.3* 06/04/2014   MCV 88.4 06/04/2014   PLT 46* 06/04/2014   NEUTROABS 0.8* 06/04/2014    Imaging:  No results found.  Medications: I have reviewed the patient's current medications.  Assessment/Plan: 1. Multiple myeloma, status post treatment with melphalan/prednisone/thalidomide beginning in November 2009. The thalidomide was increased to 200 mg daily beginning 09/11/2008. The serum M spike was slightly improved on 03/18/2009 and slightly increased on 05/09/2009. He has been maintained off of specific therapy for multiple myeloma since September 2010. The serum M spike was increased on 10/20/2011 at 4.6. He began Revlimid on 11/11/2011 with weekly dexamethasone. He began cycle 2 of Revlimid on 12/09/2011. The serum M spike was improved on 12/31/2011. The serum M spike was slightly higher on 01/28/2012. He began cycle 4 on 02/03/2012. He developed recurrent hypercalcemia. Treatment was changed to Cytoxan/Velcade/Decadron beginning 03/02/2012. Cytoxan discontinued in January 2015  Treatment continued with  Velcade/Decadron.   Stable serum M spike and IgG 10/04/2013.   Stable serum M spike 12/06/2013   Treatment placed on hold 01/10/2014 secondary to thrombocytopenia and a slightly higher M spike   Serum M spike and IgG increased 03/07/2014.  Cycle 1 Pomalidomide/dexamethasone 04/01/2014.  Pomalidomide subsequently placed on hold due to cytopenias.  Pomalidomide resumed at a reduced dose 05/23/2014.  Pomalidomide placed on hold 06/04/2014 due to progressive pancytopenia. 2. Chronic "dizziness."  3. Chest x-ray 11/01/2008 with a patchy opacity at the right midlung suspicious for pneumonia. He was treated with Avelox. 4. Low back pain with a radicular component. An MRI on 10/07/2008 showed no involvement of the lumbosacral spine with myeloma. 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 5. Bilateral neck pain, status post a CT 05/17/2008 with findings of spinal stenosis and osteoarthritis. 6. 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 Red cell microcytosis, likely related to hemoglobin C trait. A ferritin level returned elevated and a stool Hemoccult was negative 01/23/2010. He underwent a colonoscopy in 2009. 8. Elevated beta-2 microglobulin level. 9. History of hypertension. 10. History of a herniated disk at L4-L5 on MRI an 01/11/2002. 11. Hyperlipidemia. 12. Gastroesophageal reflux disease. 13. History of atypical angina. 14. History of rectal bleeding-large external hemorrhoids noted 02/25/2012. The stool was Hemoccult positive. He has a history of colon polyps. He is followed by [facility]. He was diagnosed with hemorrhoids in September of 2013 while hospitalized. He underwent a band procedure 03/09/2013. He has had no further rectal bleeding. 15. Superficial venous thrombosis of the greater saphenous vein on a Doppler  09/27/2008. Negative  for deep vein thrombosis. Symptoms improved with aspirin. 16. Neck pain with numbness/tingling in the arms, hands, and low back with proximal right leg weakness. An MRI of the cervical spine 05/30/2009 showed multilevel spondylosis with mild cord edema at C4-C5 and C5-C6. He was noted to have an enhancing lesion of the cord at T3-T4 of unclear etiology. In the lumbar spine there was no change in the spondylosis at L3-L4 and L4-L5. There was no involvement of the cervical or lumbar spine with myeloma. He is status post C4-C5, C5-C6, and C6-C7 anterior cervical diskectomy with fusion by Dr. Annette Stable 08/01/2009. 17. Thrombocytopenia-progressive, likely secondary to progression of multiple myeloma 18. Indeterminate-age deep vein thrombosis of the left lower extremity on a venous Doppler 08/15/2009. There were also findings consistent with superficial thrombosis involving the right lower extremity 08/15/2009. Coumadin was discontinued in October 2011. 19. Type 2 diabetes. Now on an oral hypoglycemic 20. History of hematuria, followed at [facility]. Urinalysis was negative for blood on 07/23/2011. 21. Radicular back pain with MRI of the lumbar spine on 10/19/2011 showing nerve root impingement at L2-L3, L3-L4 and L4-L5 with the most severe at the right L3 nerve roots due to a large disc fragment. Associated right leg weakness. He underwent right L2-3 decompressive laminotomy with right L2 and L3 decompressive foraminotomy; right L2-3 microdiscectomy on 11/01/2011. 22. Constipation. He continues a laxative regimen. 23. Hospitalization 11/08/2011 through 11/10/2011 with orthostatic hypotension/dizziness. He improved with intravenous hydration. He had mild hypotension when here on 11/18/2011. We recommended discontinuing Norvasc and Proscar. 24. Hypercalcemia status post pamidronate 11/03/2011. Recurrent hypercalcemia 02/16/2012 status post Zometa. The calcium has remained in normal  range. 25. Fractured tooth with associated pain. Status post a tooth extraction by Dr. Enrique Sack, Zometa was placed on hold. 26. Exertional dyspnea. Stable. He was referred to cardiology, pulmonary function studies 06/15/2013 showed a moderately severe diffusion defect. He utilizes supplemental oxygen as needed. He is followed by pulmonary. 27. 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 28. Neutropenia/thrombocytopenia after pomalidomide   Disposition: [name] has recurrent/progressive pancytopenia since resuming Pomalidomide 05/23/2014. We are placing the Pomalidomide on hold. He has symptomatic anemia and will receive 2 units of blood today. He will return for labs and a followup visit in one week. He will contact the office in the interim with any problems. We specifically discussed fever, chills, other signs of infection, bleeding.  Patient seen with Dr. Benay Spice.    Ned Card ANP/GNP-BC   06/04/2014  12:36 PM  This was a shared visit with Ned Card. He has progressive pancytopenia despite the dose reduction of pomalidomide. The pancytopenia is most likely secondary to pomalidomide. He will be transfused for symptomatic anemia. We will follow-up on the serum protein electrophoresis from today. [name] will return for an office visit in one week.  Julieanne Manson, M.D.

## 2014-06-05 ENCOUNTER — Ambulatory Visit (HOSPITAL_BASED_OUTPATIENT_CLINIC_OR_DEPARTMENT_OTHER): Payer: Medicare HMO

## 2014-06-05 ENCOUNTER — Encounter (HOSPITAL_COMMUNITY): Payer: Self-pay | Admitting: Hematology

## 2014-06-05 ENCOUNTER — Other Ambulatory Visit: Payer: Self-pay | Admitting: *Deleted

## 2014-06-05 ENCOUNTER — Telehealth: Payer: Self-pay | Admitting: Nurse Practitioner

## 2014-06-05 ENCOUNTER — Ambulatory Visit (HOSPITAL_BASED_OUTPATIENT_CLINIC_OR_DEPARTMENT_OTHER): Payer: Medicare HMO | Admitting: Nurse Practitioner

## 2014-06-05 VITALS — BP 117/55 | HR 82 | Temp 98.2°F | Resp 18

## 2014-06-05 DIAGNOSIS — D61810 Antineoplastic chemotherapy induced pancytopenia: Secondary | ICD-10-CM | POA: Diagnosis not present

## 2014-06-05 DIAGNOSIS — D649 Anemia, unspecified: Secondary | ICD-10-CM

## 2014-06-05 DIAGNOSIS — D62 Acute posthemorrhagic anemia: Secondary | ICD-10-CM

## 2014-06-05 DIAGNOSIS — C9000 Multiple myeloma not having achieved remission: Secondary | ICD-10-CM

## 2014-06-05 LAB — TYPE AND SCREEN
ABO/RH(D): B POS
ANTIBODY SCREEN: NEGATIVE
Unit division: 0
Unit division: 0

## 2014-06-05 LAB — PREPARE RBC (CROSSMATCH)

## 2014-06-05 MED ORDER — FUROSEMIDE 10 MG/ML IJ SOLN
20.0000 mg | Freq: Once | INTRAMUSCULAR | Status: AC
  Administered 2014-06-05: 20 mg via INTRAVENOUS

## 2014-06-05 MED ORDER — FUROSEMIDE 10 MG/ML IJ SOLN: 20.0000 mg | Freq: Once | INTRAMUSCULAR | Status: DC

## 2014-06-05 MED ORDER — SODIUM CHLORIDE 0.9 % IV SOLN
250.0000 mL | Freq: Once | INTRAVENOUS | Status: AC
  Administered 2014-06-05: 250 mL via INTRAVENOUS

## 2014-06-05 NOTE — Progress Notes (Signed)
Spoke with NP from [facility].  I explained that the [facility] could transfuse one unit of blood.  Explained orders needed to be signed/held under hospital orders.  She indicated patient has already been typed/screened.  NP then indicated she was going to call the on-call physician.  I asked that she call the [facility] back by 4:30 to advise if patient was going to be transfused here.  Caller verbalized understanding.

## 2014-06-05 NOTE — Telephone Encounter (Signed)
Pt's wife called back seen where she missed call I advise her that I had sch pt for next wk labs/md and to p/u sch before the leave our office today, she confirmed.... KJ

## 2014-06-05 NOTE — Patient Instructions (Signed)

## 2014-06-05 NOTE — Telephone Encounter (Signed)
Labs/ov added per 11/24 POF, sent msg to chemo room requesting pt to p/u new schedule today before he leaves.... KJ

## 2014-06-06 LAB — TYPE AND SCREEN
ABO/RH(D): B POS
ANTIBODY SCREEN: NEGATIVE
Unit division: 0

## 2014-06-07 ENCOUNTER — Encounter: Payer: Self-pay | Admitting: Nurse Practitioner

## 2014-06-07 ENCOUNTER — Telehealth: Payer: Self-pay | Admitting: Nurse Practitioner

## 2014-06-07 LAB — PROTEIN ELECTROPHORESIS, SERUM
ALPHA-2-GLOBULIN: 10.1 % (ref 7.1–11.8)
Albumin ELP: 46 % — ABNORMAL LOW (ref 55.8–66.1)
Alpha-1-Globulin: 5.7 % — ABNORMAL HIGH (ref 2.9–4.9)
BETA 2: 4.5 % (ref 3.2–6.5)
Beta Globulin: 4.7 % (ref 4.7–7.2)
Gamma Globulin: 29 % — ABNORMAL HIGH (ref 11.1–18.8)
M-Spike, %: 1.53 g/dL
TOTAL PROTEIN, SERUM ELECTROPHOR: 7.1 g/dL (ref 6.0–8.3)

## 2014-06-07 LAB — IGG: IgG (Immunoglobin G), Serum: 2010 mg/dL — ABNORMAL HIGH (ref 650–1600)

## 2014-06-07 NOTE — Telephone Encounter (Signed)
Pt wife r/s appt due to her having a appt on 06/11/14. She confirm appt for 06/12/14.

## 2014-06-07 NOTE — Assessment & Plan Note (Signed)
Patient initiated Pomalyst therapy on 05/23/2014.  Labs drawn earlier this week revealed a marked neutropenia with an ANC of 0.8.  Patient was again reminded of all neutropenia guidelines.  Platelet count was down to 46.  Patient denies any worsening issues with either easy bleeding or bruising.  Hemoglobin was 7.7 at that time.  Patient was ordered 2 units packed red blood cells transfusion.  Patient received 1 unit of blood yesterday; and was here today at the [facility] to receive 1 unit of blood.  Patient had inadvertently removed his blood band this afternoon; so there was a delay in rebanding patient prior to the initiation of his transfusion.  Patient's left transfusion rate was increased from 185 ML's per hour to 200 mL as per our afternoon approximately 30 minute observation period to confirm patient had no reaction to he transfusion. Since patient does have a history of some chronic CHF-Patient was also given Lasix 20 mg IV following this last unit of blood.

## 2014-06-07 NOTE — Progress Notes (Signed)
will   SYMPTOM MANAGEMENT CLINIC   HPI: [name] 78 y.o. male diagnosed with multiple myeloma.  Currently undergoing Pomalyst oral therapy.  Patient initiated oral Pomalyst therapy on 05/23/2014.  He presented to the [place] for labs and follow up visit earlier this week; and was found to be pancytopenic.  ANC was down to 0.8, count was down to 46, and hemoglobin was 7.7.  Patient was instructed at that time to hold any further Pomalyst for the time being.  Patient was ordered 2 units of packed red blood cells transfusion.  Patient received 1 unit of blood yesterday; and presented to the [place] today to receive a second unit of blood.  However, patient did inadvertently removed his blood band; so patient had to be re-banded today.  He paragraph patient has a history of chronic CHF; and he was noted have some trace bilateral edema to his lower extremities.  Patient states that he does take Lasix 20 mg orally every morning.  Patient was given an additional Lasix 20 mg IV following his second unit of blood today.   HPI  CURRENT THERAPY: Upcoming Treatment Dates - MYELOMA SALVAGE Bortezomib SQ q7d Days with orders from any treatment category:  01/10/2014      SCHEDULING COMMUNICATION      dexamethasone (DECADRON) tablet 20 mg      ondansetron (ZOFRAN) tablet 8 mg      bortezomib SQ (VELCADE) chemo injection 2.25 mg      TREATMENT CONDITIONS 01/17/2014      SCHEDULING COMMUNICATION      dexamethasone (DECADRON) tablet 20 mg      ondansetron (ZOFRAN) tablet 8 mg      bortezomib SQ (VELCADE) chemo injection 2.25 mg      TREATMENT CONDITIONS    ROS  Past Medical History  Diagnosis Date  . GERD (gastroesophageal reflux disease)   . Essential hypertension, benign   . BPH (benign prostatic hyperplasia)   . Lumbar herniated disc     L4-5  . Hyperlipidemia   . Sleep apnea   . Multiple myeloma 02/19/2008  . Arthritis   . Diverticulosis   . Peptic ulcer disease   . Tubular  adenoma 02/16/2008    Dr. Silvano Rusk    Past Surgical History  Procedure Laterality Date  . Neck surgery    . Lumbar laminectomy/decompression microdiscectomy  11/01/2011    Procedure: LUMBAR LAMINECTOMY/DECOMPRESSION MICRODISCECTOMY 2 LEVELS;  Surgeon: Charlie Pitter, MD;  Location: [facility];  Service: Neurosurgery;  Laterality: Right;  Lumbar Laminectomy/Microdiscectomy Decompression Lumbar Three-Four, Lumbar Four-Five   . Colonoscopy    . Hemorrhoid banding  2014    has Obstructive sleep apnea; Insomnia with sleep apnea; Multiple myeloma; Lumbar disc herniation with radiculopathy; Renal insufficiency; Constipation; Personal history of colonic adenoma; Essential hypertension, benign; Mixed hyperlipidemia; Other malaise and fatigue; Hemorrhoids, internal, with bleeding and grade 2 prolapse; Seasonal allergic rhinitis; Recurrent isolated sleep paralysis; Dyspnea on exertion; Chest congestion; Fever, unspecified; Low oxygen saturation; Asthma with COPD; Hypoxia; SOB (shortness of breath); Pneumonia; Diastolic CHF; GERD (gastroesophageal reflux disease); Fatigue; Chest pain; Shoulder pain; Hypoalbuminemia due to protein-calorie malnutrition; Peripheral edema; and Pancytopenia due to chemotherapy on his problem list.     is allergic to penicillins.    Medication List       This list is accurate as of: 06/05/14 11:59 PM.  Always use your most recent med list.  acyclovir 400 MG tablet  Commonly known as:  ZOVIRAX  TAKE 1 TABLET (400 MG TOTAL) BY MOUTH 2 (TWO) TIMES DAILY.     albuterol 108 (90 BASE) MCG/ACT inhaler  Commonly known as:  PROVENTIL HFA  Inhale 2 puffs into the lungs every 6 (six) hours as needed for wheezing or shortness of breath.     AMBULATORY NON FORMULARY MEDICATION  O2 @@ 2LMP whenever active     amLODipine 2.5 MG tablet  Commonly known as:  NORVASC  Take 1 tablet (2.5 mg total) by mouth daily.     aspirin EC 81 MG tablet  Take 1 tablet (81 mg  total) by mouth daily.     atorvastatin 20 MG tablet  Commonly known as:  LIPITOR  TAKE 1 TABLET (20 MG TOTAL) BY MOUTH DAILY.     canagliflozin 100 MG Tabs tablet  Commonly known as:  INVOKANA  Take 1 tablet (100 mg total) by mouth daily.     clonazePAM 0.5 MG tablet  Commonly known as:  KLONOPIN  TAKE 1-2 TABLETS THIRTY MINUTES BEFORE BEDTIME AS NEEDED     cromolyn 5.2 MG/ACT nasal spray  Commonly known as:  NASALCROM  Place 1 spray into both nostrils 4 (four) times daily.     ENSURE PLUS Liqd  Take 237 mLs by mouth.     fluticasone 50 MCG/ACT nasal spray  Commonly known as:  FLONASE  Place 2 sprays into both nostrils daily.     furosemide 20 MG tablet  Commonly known as:  LASIX  Take 1 tablet (20 mg total) by mouth daily.     lactulose 10 GM/15ML solution  Commonly known as:  CHRONULAC  Take 15 mLs (10 g total) by mouth 2 (two) times daily as needed.     loratadine 10 MG tablet  Commonly known as:  CLARITIN  TAKE 1 TABLET (10 MG TOTAL) BY MOUTH DAILY.     nitroGLYCERIN 0.4 MG SL tablet  Commonly known as:  NITROSTAT  Place 1 tablet (0.4 mg total) under the tongue every 5 (five) minutes x 3 doses as needed for chest pain.     pantoprazole 40 MG tablet  Commonly known as:  PROTONIX  Take 1 tablet (40 mg total) by mouth daily.     polyethylene glycol powder powder  Commonly known as:  GLYCOLAX/MIRALAX  Take 1-2 doses daily as needed     pomalidomide 2 MG capsule  Commonly known as:  POMALYST  Take 1 capsule (2 mg total) by mouth daily. Take with water on days 1-21. Repeat every 28 days.     potassium chloride SA 20 MEQ tablet  Commonly known as:  KLOR-CON M20  Take 1 tablet (20 mEq total) by mouth daily.     KLOR-CON M20 20 MEQ tablet  Generic drug:  potassium chloride SA  TAKE 1 TABLET (20 MEQ TOTAL) BY MOUTH DAILY.     prochlorperazine 10 MG tablet  Commonly known as:  COMPAZINE  TAKE 1 TABLET BY MOUTH EVERY SIX HOURS AS NEEDED     tamsulosin 0.4 MG  Caps capsule  Commonly known as:  FLOMAX  TAKE 1 CAPSULE (0.4 MG TOTAL) BY MOUTH 2 (TWO) TIMES DAILY. WITH A MEAL.     traMADol 50 MG tablet  Commonly known as:  ULTRAM  Take 1 tablet (50 mg total) by mouth every 8 (eight) hours as needed.         PHYSICAL EXAMINATION  Vitals: BP 117/75, HR 82,  temp 98.2, sat 99  Physical Exam  Constitutional: He is oriented to person, place, and time and well-developed, well-nourished, and in no distress.  HENT:  Head: Normocephalic and atraumatic.  Mouth/Throat: Oropharynx is clear and moist.  Eyes: Conjunctivae and EOM are normal. Pupils are equal, round, and reactive to light. Right eye exhibits no discharge. Left eye exhibits no discharge. No scleral icterus.  Neck: Normal range of motion. Neck supple. No JVD present. No tracheal deviation present. No thyromegaly present.  Cardiovascular: Normal rate, regular rhythm, normal heart sounds and intact distal pulses.   Pulmonary/Chest: Effort normal. No respiratory distress. He has no wheezes. He has no rales.  Abdominal: Soft. Bowel sounds are normal. He exhibits no distension and no mass. There is no tenderness. There is no rebound and no guarding.  Musculoskeletal: Normal range of motion. He exhibits edema. He exhibits no tenderness.  Trace bilateral edema to lower extremities only.  Patient had no shortness of breath or respiratory distress on exam today.  Lymphadenopathy:    He has no cervical adenopathy.  Neurological: He is alert and oriented to person, place, and time. Gait normal.  Skin: Skin is warm and dry. No rash noted. No erythema.  Psychiatric: Affect normal.  Nursing note and vitals reviewed.   LABORATORY DATA:. Appointment on 06/04/2014  Component Date Value Ref Range Status  . WBC 06/04/2014 2.6* 4.0 - 10.3 10e3/uL Final  . NEUT# 06/04/2014 0.8* 1.5 - 6.5 10e3/uL Final  . HGB 06/04/2014 7.7* 13.0 - 17.1 g/dL Final  . HCT 06/04/2014 21.3* 38.4 - 49.9 % Final  . Platelets  06/04/2014 46* 140 - 400 10e3/uL Final  . MCV 06/04/2014 88.4  79.3 - 98.0 fL Final  . MCH 06/04/2014 32.0  27.2 - 33.4 pg Final  . MCHC 06/04/2014 36.2* 32.0 - 36.0 g/dL Final  . RBC 06/04/2014 2.41* 4.20 - 5.82 10e6/uL Final  . RDW 06/04/2014 18.5* 11.0 - 14.6 % Final  . lymph# 06/04/2014 1.2  0.9 - 3.3 10e3/uL Final  . MONO# 06/04/2014 0.2  0.1 - 0.9 10e3/uL Final  . Eosinophils Absolute 06/04/2014 0.3  0.0 - 0.5 10e3/uL Final  . Basophils Absolute 06/04/2014 0.0  0.0 - 0.1 10e3/uL Final  . NEUT% 06/04/2014 31.6* 39.0 - 75.0 % Final  . LYMPH% 06/04/2014 48.2  14.0 - 49.0 % Final  . MONO% 06/04/2014 9.3  0.0 - 14.0 % Final  . EOS% 06/04/2014 10.1* 0.0 - 7.0 % Final  . BASO% 06/04/2014 0.8  0.0 - 2.0 % Final  . nRBC 06/04/2014 1* 0 - 0 % Final  . Sodium 06/04/2014 133* 136 - 145 mEq/L Final  . Potassium 06/04/2014 4.3  3.5 - 5.1 mEq/L Final  . Chloride 06/04/2014 103  98 - 109 mEq/L Final  . CO2 06/04/2014 21* 22 - 29 mEq/L Final  . Glucose 06/04/2014 153* 70 - 140 mg/dl Final  . BUN 06/04/2014 18.8  7.0 - 26.0 mg/dL Final  . Creatinine 06/04/2014 1.4* 0.7 - 1.3 mg/dL Final  . Total Bilirubin 06/04/2014 1.14  0.20 - 1.20 mg/dL Final  . Alkaline Phosphatase 06/04/2014 61  40 - 150 U/L Final  . AST 06/04/2014 14  5 - 34 U/L Final  . ALT 06/04/2014 7  0 - 55 U/L Final  . Total Protein 06/04/2014 7.3  6.4 - 8.3 g/dL Final  . Albumin 06/04/2014 3.1* 3.5 - 5.0 g/dL Final  . Calcium 06/04/2014 9.3  8.4 - 10.4 mg/dL Final  . Anion Gap  06/04/2014 9  3 - 11 mEq/L Final  . IgG (Immunoglobin G), Serum 06/04/2014 2010* 650 - 1600 mg/dL Preliminary  . Technologist Review 06/04/2014 1% blast, 2% nrbc, targets, few helmets   Final  Hospital Outpatient Visit on 06/04/2014  Component Date Value Ref Range Status  . Order Confirmation 06/04/2014 ORDER PROCESSED BY BLOOD BANK   Final  . ABO/RH(D) 06/04/2014 B POS   Final  . Antibody Screen 06/04/2014 NEG   Final  . Sample Expiration 06/04/2014  06/07/2014   Final  . Unit Number 06/04/2014 [id number]   Final  . Blood Component Type 06/04/2014 RED CELLS,LR   Final  . Unit division 06/04/2014 00   Final  . Status of Unit 06/04/2014 ISSUED,FINAL   Final  . Transfusion Status 06/04/2014 OK TO TRANSFUSE   Final  . Crossmatch Result 06/04/2014 Compatible   Final  . Unit Number 06/04/2014 [id number]   Final  . Blood Component Type 06/04/2014 RED CELLS,LR   Final  . Unit division 06/04/2014 00   Final  . Status of Unit 06/04/2014 REL FROM [facility]   Final  . Transfusion Status 06/04/2014 OK TO TRANSFUSE   Final  . Crossmatch Result 06/04/2014 Compatible   Final  . Order Confirmation 06/05/2014 ORDER PROCESSED BY BLOOD BANK   Final  . ABO/RH(D) 06/05/2014 B POS   Final  . Antibody Screen 06/05/2014 NEG   Final  . Sample Expiration 06/05/2014 06/08/2014   Final  . Unit Number 06/05/2014 [id number]   Final  . Blood Component Type 06/05/2014 RED CELLS,LR   Final  . Unit division 06/05/2014 00   Final  . Status of Unit 06/05/2014 ISSUED,FINAL   Final  . Transfusion Status 06/05/2014 OK TO TRANSFUSE   Final  . Crossmatch Result 06/05/2014 Compatible   Final     RADIOGRAPHIC STUDIES: No results found.  ASSESSMENT/PLAN:    Multiple myeloma Patient initiated Pomalyst oral therapy on 05/23/2014.  He presented for labs and a followup visit earlier this week; and was noted to be pancytopenic.  ANC was down to 0.8.  Hemoglobin was 7.7; and platelet count was 46. Patient was advised at that time to hold any further Pomalyst for the time being.  Patient was ordered 2 units of blood to be transfused.  Patient has plans to return to the [facility] for labs and a followup visit on 06/11/2014.  Pancytopenia due to chemotherapy Patient initiated Pomalyst therapy on 05/23/2014.  Labs drawn earlier this week revealed a marked neutropenia with an ANC of 0.8.  Patient was again reminded of all neutropenia guidelines.  Platelet count was down to  46.  Patient denies any worsening issues with either easy bleeding or bruising.  Hemoglobin was 7.7 at that time.  Patient was ordered 2 units packed red blood cells transfusion.  Patient received 1 unit of blood yesterday; and was here today at the [facility] to receive 1 unit of blood.  Patient had inadvertently removed his blood band this afternoon; so there was a delay in rebanding patient prior to the initiation of his transfusion.  Patient's left transfusion rate was increased from 185 ML's per hour to 200 mL as per our afternoon approximately 30 minute observation period to confirm patient had no reaction to he transfusion. Since patient does have a history of some chronic CHF-Patient was also given Lasix 20 mg IV following this last unit of blood.  Patient stated understanding of all instructions; and was in agreement with this plan of  care. The patient knows to call the clinic with any problems, questions or concerns.   Review/collaboration with Dr. Benay Spice regarding all aspects of patient's visit today.   Total time spent with patient was 15 minutes;  with greater than 80 percent of that time spent in face to face counseling regarding his symptoms, and coordination of care and follow up.  Disclaimer: This note was dictated with voice recognition software. Similar sounding words can inadvertently be transcribed and may not be corrected upon review.   Drue Second, NP 06/07/2014

## 2014-06-07 NOTE — Assessment & Plan Note (Addendum)
Patient initiated Pomalyst oral therapy on 05/23/2014.  He presented for labs and a followup visit earlier this week; and was noted to be pancytopenic.  ANC was down to 0.8.  Hemoglobin was 7.7; and platelet count was 46. Patient was advised at that time to hold any further Pomalyst for the time being.  Patient was ordered 2 units of blood to be transfused.  Patient has plans to return to the [place] [facility] for labs and a followup visit on 06/11/2014.

## 2014-06-10 ENCOUNTER — Telehealth: Payer: Self-pay | Admitting: *Deleted

## 2014-06-10 NOTE — Telephone Encounter (Signed)
Wife called to confirm his appointment was changed to Wednesday 12/2 at 11:15. This was confirmed with her. Inquired how [name] is doing since his blood transfusion. She reports "he's fine".

## 2014-06-11 ENCOUNTER — Ambulatory Visit: Payer: Self-pay | Admitting: Nurse Practitioner

## 2014-06-11 ENCOUNTER — Other Ambulatory Visit: Payer: Self-pay

## 2014-06-12 ENCOUNTER — Ambulatory Visit (HOSPITAL_BASED_OUTPATIENT_CLINIC_OR_DEPARTMENT_OTHER): Payer: Medicare HMO | Admitting: Nurse Practitioner

## 2014-06-12 ENCOUNTER — Telehealth: Payer: Self-pay | Admitting: Nurse Practitioner

## 2014-06-12 ENCOUNTER — Other Ambulatory Visit (HOSPITAL_BASED_OUTPATIENT_CLINIC_OR_DEPARTMENT_OTHER): Payer: Medicare HMO

## 2014-06-12 ENCOUNTER — Telehealth: Payer: Self-pay | Admitting: *Deleted

## 2014-06-12 VITALS — BP 113/56 | HR 93 | Temp 98.0°F | Resp 18 | Ht 72.0 in | Wt 245.5 lb

## 2014-06-12 DIAGNOSIS — E119 Type 2 diabetes mellitus without complications: Secondary | ICD-10-CM

## 2014-06-12 DIAGNOSIS — R0609 Other forms of dyspnea: Secondary | ICD-10-CM

## 2014-06-12 DIAGNOSIS — C9000 Multiple myeloma not having achieved remission: Secondary | ICD-10-CM

## 2014-06-12 DIAGNOSIS — D709 Neutropenia, unspecified: Secondary | ICD-10-CM

## 2014-06-12 DIAGNOSIS — D61810 Antineoplastic chemotherapy induced pancytopenia: Secondary | ICD-10-CM

## 2014-06-12 DIAGNOSIS — I10 Essential (primary) hypertension: Secondary | ICD-10-CM

## 2014-06-12 DIAGNOSIS — Z86718 Personal history of other venous thrombosis and embolism: Secondary | ICD-10-CM

## 2014-06-12 DIAGNOSIS — D6481 Anemia due to antineoplastic chemotherapy: Secondary | ICD-10-CM

## 2014-06-12 DIAGNOSIS — D696 Thrombocytopenia, unspecified: Secondary | ICD-10-CM

## 2014-06-12 DIAGNOSIS — T451X5A Adverse effect of antineoplastic and immunosuppressive drugs, initial encounter: Secondary | ICD-10-CM

## 2014-06-12 DIAGNOSIS — D630 Anemia in neoplastic disease: Secondary | ICD-10-CM

## 2014-06-12 LAB — CBC WITH DIFFERENTIAL/PLATELET
BASO%: 0.5 % (ref 0.0–2.0)
BASOS ABS: 0 10*3/uL (ref 0.0–0.1)
EOS%: 2.4 % (ref 0.0–7.0)
Eosinophils Absolute: 0.1 10*3/uL (ref 0.0–0.5)
HEMATOCRIT: 25.8 % — AB (ref 38.4–49.9)
HEMOGLOBIN: 9.1 g/dL — AB (ref 13.0–17.1)
LYMPH#: 1.6 10*3/uL (ref 0.9–3.3)
LYMPH%: 76.7 % — ABNORMAL HIGH (ref 14.0–49.0)
MCH: 31.7 pg (ref 27.2–33.4)
MCHC: 35.3 g/dL (ref 32.0–36.0)
MCV: 89.9 fL (ref 79.3–98.0)
MONO#: 0.1 10*3/uL (ref 0.1–0.9)
MONO%: 6.8 % (ref 0.0–14.0)
NEUT%: 13.6 % — AB (ref 39.0–75.0)
NEUTROS ABS: 0.3 10*3/uL — AB (ref 1.5–6.5)
Platelets: 31 10*3/uL — ABNORMAL LOW (ref 140–400)
RBC: 2.87 10*6/uL — ABNORMAL LOW (ref 4.20–5.82)
RDW: 16.8 % — AB (ref 11.0–14.6)
WBC: 2.1 10*3/uL — AB (ref 4.0–10.3)
nRBC: 0 % (ref 0–0)

## 2014-06-12 LAB — HOLD TUBE, BLOOD BANK

## 2014-06-12 NOTE — Progress Notes (Addendum)
[facility] OFFICE PROGRESS NOTE   Diagnosis:  Multiple myeloma  INTERVAL HISTORY:   [name] returns as scheduled. Pomalidomide was placed on hold at the time of his last visit 06/04/2014 due to progressive pancytopenia. He was transfused 2 units of blood. He has noted some improvement in the dyspnea. No significant improvement in his energy level. He denies any bleeding. No fever. No significant cough. He reports a good appetite.  Objective:  Vital signs in last 24 hours:  Blood pressure 113/56, pulse 93, temperature 98 F (36.7 C), temperature source Oral, resp. rate 18, height 6' (1.829 m), weight 245 lb 8 oz (111.358 kg).    HEENT: no thrush or ulcers. Resp: lungs clear bilaterally. Breath sounds are distant. Cardio: regular rate and rhythm. GI: abdomen soft and nontender. Vascular: trace lower leg edema bilaterally.   Lab Results:  Lab Results  Component Value Date   WBC 2.1* 06/12/2014   HGB 9.1* 06/12/2014   HCT 25.8* 06/12/2014   MCV 89.9 06/12/2014   PLT 31* 06/12/2014   NEUTROABS 0.3* 06/12/2014    Imaging:  No results found.  Medications: I have reviewed the patient's current medications.  Assessment/Plan: 1. Multiple myeloma, status post treatment with melphalan/prednisone/thalidomide beginning in November 2009. The thalidomide was increased to 200 mg daily beginning 09/11/2008. The serum M spike was slightly improved on 03/18/2009 and slightly increased on 05/09/2009. He has been maintained off of specific therapy for multiple myeloma since September 2010. The serum M spike was increased on 10/20/2011 at 4.6. He began Revlimid on 11/11/2011 with weekly dexamethasone. He began cycle 2 of Revlimid on 12/09/2011. The serum M spike was improved on 12/31/2011. The serum M spike was slightly higher on 01/28/2012. He began cycle 4 on 02/03/2012. He developed recurrent hypercalcemia. Treatment was changed to Cytoxan/Velcade/Decadron beginning  03/02/2012. Cytoxan discontinued in January 2015  Treatment continued with Velcade/Decadron.   Stable serum M spike and IgG 10/04/2013.   Stable serum M spike 12/06/2013   Treatment placed on hold 01/10/2014 secondary to thrombocytopenia and a slightly higher M spike   Serum M spike and IgG increased 03/07/2014.  Cycle 1 Pomalidomide/dexamethasone 04/01/2014.  Pomalidomide subsequently placed on hold due to cytopenias.  Pomalidomide resumed at a reduced dose 05/23/2014.  Pomalidomide placed on hold 06/04/2014 due to progressive pancytopenia. 2. Chronic "dizziness."  3. Chest x-ray 11/01/2008 with a patchy opacity at the right midlung suspicious for pneumonia. He was treated with Avelox. 4. Low back pain with a radicular component. An MRI on 10/07/2008 showed no involvement of the lumbosacral spine with myeloma. 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 5. Bilateral neck pain, status post a CT 05/17/2008 with findings of spinal stenosis and osteoarthritis. 6. 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 Red cell microcytosis, likely related to hemoglobin C trait. A ferritin level returned elevated and a stool Hemoccult was negative 01/23/2010. He underwent a colonoscopy in 2009. 8. Elevated beta-2 microglobulin level. 9. History of hypertension. 10. History of a herniated disk at L4-L5 on MRI an 01/11/2002. 11. Hyperlipidemia. 12. Gastroesophageal reflux disease. 13. History of atypical angina. 14. History of rectal bleeding-large external hemorrhoids noted 02/25/2012. The stool was Hemoccult positive. He has a history of colon polyps. He is followed by [facility]. He was diagnosed with hemorrhoids in September of 2013 while hospitalized. He underwent a band procedure 03/09/2013. He has had no  further rectal  bleeding. 15. Superficial venous thrombosis of the greater saphenous vein on a Doppler 09/27/2008. Negative for deep vein thrombosis. Symptoms improved with aspirin. 16. Neck pain with numbness/tingling in the arms, hands, and low back with proximal right leg weakness. An MRI of the cervical spine 05/30/2009 showed multilevel spondylosis with mild cord edema at C4-C5 and C5-C6. He was noted to have an enhancing lesion of the cord at T3-T4 of unclear etiology. In the lumbar spine there was no change in the spondylosis at L3-L4 and L4-L5. There was no involvement of the cervical or lumbar spine with myeloma. He is status post C4-C5, C5-C6, and C6-C7 anterior cervical diskectomy with fusion by Dr. Annette Stable 08/01/2009. 17. Thrombocytopenia-progressive, likely secondary to progression of multiple myeloma 18. Indeterminate-age deep vein thrombosis of the left lower extremity on a venous Doppler 08/15/2009. There were also findings consistent with superficial thrombosis involving the right lower extremity 08/15/2009. Coumadin was discontinued in October 2011. 19. Type 2 diabetes. Now on an oral hypoglycemic 20. History of hematuria, followed at [facility]. Urinalysis was negative for blood on 07/23/2011. 21. Radicular back pain with MRI of the lumbar spine on 10/19/2011 showing nerve root impingement at L2-L3, L3-L4 and L4-L5 with the most severe at the right L3 nerve roots due to a large disc fragment. Associated right leg weakness. He underwent right L2-3 decompressive laminotomy with right L2 and L3 decompressive foraminotomy; right L2-3 microdiscectomy on 11/01/2011. 22. Constipation. He continues a laxative regimen. 23. Hospitalization 11/08/2011 through 11/10/2011 with orthostatic hypotension/dizziness. He improved with intravenous hydration. He had mild hypotension when here on 11/18/2011. We recommended discontinuing Norvasc and Proscar. 24. Hypercalcemia status post pamidronate 11/03/2011. Recurrent  hypercalcemia 02/16/2012 status post Zometa. The calcium has remained in normal range. 25. Fractured tooth with associated pain. Status post a tooth extraction by Dr. Enrique Sack, Zometa was placed on hold. 26. Exertional dyspnea. Stable. He was referred to cardiology, pulmonary function studies 06/15/2013 showed a moderately severe diffusion defect. He utilizes supplemental oxygen as needed. He is followed by pulmonary. 27. 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   Disposition: Mr. Marlette has progressive neutropenia and thrombocytopenia. He will continue to hold the Pomalidomide. We reviewed with Mr. Bastin that the pancytopenia may be related to progressive multiple myeloma, MDS, or an effect of Pomalidomide. Dr. Benay Spice recommends a referral to interventional radiology for a bone marrow biopsy to include cytogenetics. He will return for a followup visit on 06/20/2014 to review the results. He and his wife understand to contact the office in the interim with fever, chills, other signs of infection, bruising/bleeding.  Patient seen with Dr. Benay Spice. 25 minutes were spent face-to-face at today's visit with the majority of that time involved in counseling/coordination of care.  Ned Card ANP/GNP-BC   06/12/2014  12:27 PM  This was a shared visit with Ned Card. Mr. Lange has persistent pancytopenia. I am concerned the pancytopenia is secondary to progression of the myeloma or myelodysplasia. He will be referred for a diagnostic bone marrow biopsy.  Julieanne Manson, M.D.

## 2014-06-12 NOTE — Telephone Encounter (Signed)
Call from pt's wife reporting BMBX has been scheduled for 12/16. Will try to get procedure rescheduled to prior 12/10 visit.

## 2014-06-12 NOTE — Telephone Encounter (Signed)
Gave avs & cal for Dec. °

## 2014-06-14 ENCOUNTER — Other Ambulatory Visit: Payer: Self-pay | Admitting: Radiology

## 2014-06-17 ENCOUNTER — Ambulatory Visit (HOSPITAL_COMMUNITY)
Admission: RE | Admit: 2014-06-17 | Discharge: 2014-06-17 | Disposition: A | Discharge status: Home / Self Care | Payer: Medicare HMO | Source: Ambulatory Visit | Attending: Nurse Practitioner | Admitting: Nurse Practitioner

## 2014-06-17 ENCOUNTER — Telehealth: Payer: Self-pay | Admitting: *Deleted

## 2014-06-17 ENCOUNTER — Encounter (HOSPITAL_COMMUNITY): Payer: Self-pay

## 2014-06-17 ENCOUNTER — Other Ambulatory Visit: Payer: Self-pay | Admitting: Nurse Practitioner

## 2014-06-17 ENCOUNTER — Ambulatory Visit: Payer: Medicare HMO | Admitting: Nutrition

## 2014-06-17 DIAGNOSIS — D61818 Other pancytopenia: Secondary | ICD-10-CM | POA: Diagnosis present

## 2014-06-17 DIAGNOSIS — E785 Hyperlipidemia, unspecified: Secondary | ICD-10-CM | POA: Diagnosis not present

## 2014-06-17 DIAGNOSIS — Z79899 Other long term (current) drug therapy: Secondary | ICD-10-CM | POA: Insufficient documentation

## 2014-06-17 DIAGNOSIS — G4730 Sleep apnea, unspecified: Secondary | ICD-10-CM | POA: Diagnosis not present

## 2014-06-17 DIAGNOSIS — D4622 Refractory anemia with excess of blasts 2: Secondary | ICD-10-CM | POA: Insufficient documentation

## 2014-06-17 DIAGNOSIS — M5126 Other intervertebral disc displacement, lumbar region: Secondary | ICD-10-CM | POA: Insufficient documentation

## 2014-06-17 DIAGNOSIS — N400 Enlarged prostate without lower urinary tract symptoms: Secondary | ICD-10-CM | POA: Insufficient documentation

## 2014-06-17 DIAGNOSIS — K219 Gastro-esophageal reflux disease without esophagitis: Secondary | ICD-10-CM | POA: Diagnosis not present

## 2014-06-17 DIAGNOSIS — I10 Essential (primary) hypertension: Secondary | ICD-10-CM | POA: Insufficient documentation

## 2014-06-17 DIAGNOSIS — C9000 Multiple myeloma not having achieved remission: Secondary | ICD-10-CM

## 2014-06-17 DIAGNOSIS — Z87891 Personal history of nicotine dependence: Secondary | ICD-10-CM | POA: Insufficient documentation

## 2014-06-17 DIAGNOSIS — D469 Myelodysplastic syndrome, unspecified: Secondary | ICD-10-CM

## 2014-06-17 HISTORY — DX: Myelodysplastic syndrome, unspecified: D46.9

## 2014-06-17 LAB — CBC WITH DIFFERENTIAL/PLATELET
Basophils Absolute: 0 10*3/uL (ref 0.0–0.1)
Basophils Relative: 1 % (ref 0–1)
EOS ABS: 0.1 10*3/uL (ref 0.0–0.7)
Eosinophils Relative: 4 % (ref 0–5)
HEMATOCRIT: 24.2 % — AB (ref 39.0–52.0)
Hemoglobin: 8.6 g/dL — ABNORMAL LOW (ref 13.0–17.0)
Lymphocytes Relative: 67 % — ABNORMAL HIGH (ref 12–46)
Lymphs Abs: 1.1 10*3/uL (ref 0.7–4.0)
MCH: 32.6 pg (ref 26.0–34.0)
MCHC: 35.5 g/dL (ref 30.0–36.0)
MCV: 91.7 fL (ref 78.0–100.0)
MONO ABS: 0.1 10*3/uL (ref 0.1–1.0)
Monocytes Relative: 7 % (ref 3–12)
NEUTROS ABS: 0.4 10*3/uL — AB (ref 1.7–7.7)
Neutrophils Relative %: 21 % — ABNORMAL LOW (ref 43–77)
Platelets: 24 10*3/uL — CL (ref 150–400)
RBC: 2.64 MIL/uL — ABNORMAL LOW (ref 4.22–5.81)
RDW: 17.3 % — AB (ref 11.5–15.5)
WBC: 1.7 10*3/uL — ABNORMAL LOW (ref 4.0–10.5)

## 2014-06-17 LAB — APTT: aPTT: 31 seconds (ref 24–37)

## 2014-06-17 LAB — BONE MARROW EXAM

## 2014-06-17 LAB — GLUCOSE, CAPILLARY: Glucose-Capillary: 110 mg/dL — ABNORMAL HIGH (ref 70–99)

## 2014-06-17 LAB — PROTIME-INR
INR: 1.11 (ref 0.00–1.49)
Prothrombin Time: 14.5 seconds (ref 11.6–15.2)

## 2014-06-17 MED ORDER — MIDAZOLAM HCL 2 MG/2ML IJ SOLN
INTRAMUSCULAR | Status: AC | PRN
  Administered 2014-06-17 (×2): 1 mg via INTRAVENOUS

## 2014-06-17 MED ORDER — HYDROCODONE-ACETAMINOPHEN 5-325 MG PO TABS
1.0000 | ORAL_TABLET | ORAL | Status: DC | PRN
  Filled 2014-06-17: qty 2

## 2014-06-17 MED ORDER — MIDAZOLAM HCL 2 MG/2ML IJ SOLN
INTRAMUSCULAR | Status: AC
  Filled 2014-06-17: qty 4

## 2014-06-17 MED ORDER — SODIUM CHLORIDE 0.9 % IV SOLN: INTRAVENOUS | Status: DC

## 2014-06-17 MED ORDER — FENTANYL CITRATE 0.05 MG/ML IJ SOLN
INTRAMUSCULAR | Status: AC
  Filled 2014-06-17: qty 4

## 2014-06-17 MED ORDER — FENTANYL CITRATE 0.05 MG/ML IJ SOLN
INTRAMUSCULAR | Status: AC | PRN
  Administered 2014-06-17 (×2): 50 ug via INTRAVENOUS

## 2014-06-17 NOTE — Discharge Instructions (Signed)
Conscious Sedation, Adult, Care After °Refer to this sheet in the next few weeks. These instructions provide you with information on caring for yourself after your procedure. Your health care provider may also give you more specific instructions. Your treatment has been planned according to current medical practices, but problems sometimes occur. Call your health care provider if you have any problems or questions after your procedure. °WHAT TO EXPECT AFTER THE PROCEDURE  °After your procedure: °· You may feel sleepy, clumsy, and have poor balance for several hours. °· Vomiting may occur if you eat too soon after the procedure. °HOME CARE INSTRUCTIONS °· Do not participate in any activities where you could become injured for at least 24 hours. Do not: °¨ Drive. °¨ Swim. °¨ Ride a bicycle. °¨ Operate heavy machinery. °¨ Cook. °¨ Use power tools. °¨ Climb ladders. °¨ Work from a high place. °· Do not make important decisions or sign legal documents until you are improved. °· If you vomit, drink water, juice, or soup when you can drink without vomiting. Make sure you have little or no nausea before eating solid foods. °· Only take over-the-counter or prescription medicines for pain, discomfort, or fever as directed by your health care provider. °· Make sure you and your family fully understand everything about the medicines given to you, including what side effects may occur. °· You should not drink alcohol, take sleeping pills, or take medicines that cause drowsiness for at least 24 hours. °· If you smoke, do not smoke without supervision. °· If you are feeling better, you may resume normal activities 24 hours after you were sedated. °· Keep all appointments with your health care provider. °SEEK MEDICAL CARE IF: °· Your skin is pale or bluish in color. °· You continue to feel nauseous or vomit. °· Your pain is getting worse and is not helped by medicine. °· You have bleeding or swelling. °· You are still sleepy or  feeling clumsy after 24 hours. °SEEK IMMEDIATE MEDICAL CARE IF: °· You develop a rash. °· You have difficulty breathing. °· You develop any type of allergic problem. °· You have a fever. °MAKE SURE YOU: °· Understand these instructions. °· Will watch your condition. °· Will get help right away if you are not doing well or get worse. °Document Released: 04/18/2013 Document Reviewed: 04/18/2013 °ExitCare® Patient Information ©2015 [employer]. This information is not intended to replace advice given to you by your health care provider. Make sure you discuss any questions you have with your health care provider. °Bone Biopsy, Needle, Care After °Read the instructions outlined below and refer to this sheet in the next few weeks. These discharge instructions provide you with general information on caring for yourself after you leave the hospital. Your caregiver may also give you specific instructions. While your treatment has been planned according to the most current medical practices available, unavoidable complications sometimes occur. If you have any problems or questions after discharge, call your caregiver. °Finding out the results of your test °Not all test results are available during your visit. If your test results are not back during the visit, make an appointment with your caregiver to find out the results. Do not assume everything is normal if you have not heard from your caregiver or the medical facility. It is important for you to follow up on all of your test results.  °SEEK MEDICAL CARE IF:  °· You have redness, swelling, or increasing pain at the site of the biopsy. °·   You have pus coming from the biopsy site. °· You have drainage from the biopsy site lasting longer than 1 day. °· You notice a bad smell coming from the biopsy site or dressing. °· You develop persistent nausea or vomiting. °SEEK IMMEDIATE MEDICAL CARE IF: °· You have a fever. °· You develop a rash. °· You have difficulty  breathing. °· You develop any reaction or side effects to medicines given. °Document Released: 01/15/2005 Document Revised: 04/18/2013 Document Reviewed: 12/03/2008 °ExitCare® Patient Information ©2015 [employer]. This information is not intended to replace advice given to you by your health care provider. Make sure you discuss any questions you have with your health care provider. ° °

## 2014-06-17 NOTE — H&P (Signed)
Chief Complaint: "I'm here for a bone marrow biopsy" HPI: [name] is an 78 y.o. male with pancytopenia. He is scheduled today for bone marrow biopsy. PMHx, meds, labs reviewed. Has been NPO this am  Past Medical History:  Past Medical History  Diagnosis Date  . GERD (gastroesophageal reflux disease)   . Essential hypertension, benign   . BPH (benign prostatic hyperplasia)   . Lumbar herniated disc     L4-5  . Hyperlipidemia   . Sleep apnea   . Multiple myeloma 02/19/2008  . Arthritis   . Diverticulosis   . Peptic ulcer disease   . Tubular adenoma 02/16/2008    Dr. Silvano Rusk    Past Surgical History:  Past Surgical History  Procedure Laterality Date  . Neck surgery    . Lumbar laminectomy/decompression microdiscectomy  11/01/2011    Procedure: LUMBAR LAMINECTOMY/DECOMPRESSION MICRODISCECTOMY 2 LEVELS;  Surgeon: Charlie Pitter, MD;  Location: [facility];  Service: Neurosurgery;  Laterality: Right;  Lumbar Laminectomy/Microdiscectomy Decompression Lumbar Three-Four, Lumbar Four-Five   . Colonoscopy    . Hemorrhoid banding  2014    Family History:  Family History  Problem Relation Age of Onset  . Adopted: Yes    Social History:  reports that he quit smoking about 23 years ago. His smoking use included Cigarettes. He has a 40 pack-year smoking history. He has never used smokeless tobacco. He reports that he drinks alcohol. He reports that he does not use illicit drugs.  Allergies:  Allergies  Allergen Reactions  . Penicillins Hives and Rash    Medications:   Medication List    ASK your doctor about these medications        acetaminophen 500 MG tablet  Commonly known as:  TYLENOL  Take 500 mg by mouth every 6 (six) hours as needed for moderate pain.     acyclovir 400 MG tablet  Commonly known as:  ZOVIRAX  TAKE 1 TABLET (400 MG TOTAL) BY MOUTH 2 (TWO) TIMES DAILY.     albuterol 108 (90 BASE) MCG/ACT inhaler  Commonly known as:  PROVENTIL HFA  Inhale 2  puffs into the lungs every 6 (six) hours as needed for wheezing or shortness of breath.     AMBULATORY NON FORMULARY MEDICATION  O2 @@ 2LMP whenever active     amLODipine 2.5 MG tablet  Commonly known as:  NORVASC  Take 1 tablet (2.5 mg total) by mouth daily.     aspirin EC 81 MG tablet  Take 1 tablet (81 mg total) by mouth daily.     atorvastatin 20 MG tablet  Commonly known as:  LIPITOR  TAKE 1 TABLET (20 MG TOTAL) BY MOUTH DAILY.     atorvastatin 20 MG tablet  Commonly known as:  LIPITOR  Take 20 mg by mouth 2 (two) times daily.     canagliflozin 100 MG Tabs tablet  Commonly known as:  INVOKANA  Take 1 tablet (100 mg total) by mouth daily.     clonazePAM 0.5 MG tablet  Commonly known as:  KLONOPIN  TAKE 1-2 TABLETS THIRTY MINUTES BEFORE BEDTIME AS NEEDED     cromolyn 5.2 MG/ACT nasal spray  Commonly known as:  NASALCROM  Place 1 spray into both nostrils 4 (four) times daily.     fluticasone 50 MCG/ACT nasal spray  Commonly known as:  FLONASE  Place 2 sprays into both nostrils daily.     furosemide 20 MG tablet  Commonly known as:  LASIX  Take  1 tablet (20 mg total) by mouth daily.     lactulose 10 GM/15ML solution  Commonly known as:  CHRONULAC  Take 15 mLs (10 g total) by mouth 2 (two) times daily as needed.     loratadine 10 MG tablet  Commonly known as:  CLARITIN  Take 10 mg by mouth daily at 12 noon.     loratadine 10 MG tablet  Commonly known as:  CLARITIN  TAKE 1 TABLET (10 MG TOTAL) BY MOUTH DAILY.     nitroGLYCERIN 0.4 MG SL tablet  Commonly known as:  NITROSTAT  Place 1 tablet (0.4 mg total) under the tongue every 5 (five) minutes x 3 doses as needed for chest pain.     OVER THE COUNTER MEDICATION  Apply 1 application topically daily as needed (pain). Cream for pain.--Pain rub     pantoprazole 40 MG tablet  Commonly known as:  PROTONIX  Take 1 tablet (40 mg total) by mouth daily.     polyethylene glycol packet  Commonly known as:  MIRALAX /  GLYCOLAX  Take 17 g by mouth every evening.     polyethylene glycol powder powder  Commonly known as:  GLYCOLAX/MIRALAX  Take 1-2 doses daily as needed     polyvinyl alcohol 1.4 % ophthalmic solution  Commonly known as:  LIQUIFILM TEARS  Place 2 drops into both eyes 2 (two) times daily.     pomalidomide 2 MG capsule  Commonly known as:  POMALYST  Take 1 capsule (2 mg total) by mouth daily. Take with water on days 1-21. Repeat every 28 days.     potassium chloride 20 MEQ packet  Commonly known as:  KLOR-CON  Take 20 mEq by mouth 2 (two) times daily.     potassium chloride SA 20 MEQ tablet  Commonly known as:  KLOR-CON M20  Take 1 tablet (20 mEq total) by mouth daily.     KLOR-CON M20 20 MEQ tablet  Generic drug:  potassium chloride SA  TAKE 1 TABLET (20 MEQ TOTAL) BY MOUTH DAILY.     prochlorperazine 10 MG tablet  Commonly known as:  COMPAZINE  TAKE 1 TABLET BY MOUTH EVERY SIX HOURS AS NEEDED     prochlorperazine 10 MG tablet  Commonly known as:  COMPAZINE  Take 10 mg by mouth every 6 (six) hours as needed for nausea or vomiting.     tamsulosin 0.4 MG Caps capsule  Commonly known as:  FLOMAX  Take 0.4 mg by mouth 2 (two) times daily.     tamsulosin 0.4 MG Caps capsule  Commonly known as:  FLOMAX  TAKE 1 CAPSULE (0.4 MG TOTAL) BY MOUTH 2 (TWO) TIMES DAILY. WITH A MEAL.     traMADol 50 MG tablet  Commonly known as:  ULTRAM  Take 1 tablet (50 mg total) by mouth every 8 (eight) hours as needed.        Please HPI for pertinent positives, otherwise complete 10 system ROS negative.  Physical Exam: Pulse 91  Temp(Src) 97.8 F (36.6 C)  Resp 20  SpO2 95% There is no weight on file to calculate BMI.   General Appearance:  Alert, cooperative, no distress, appears stated age  Head:  Normocephalic, without obvious abnormality, atraumatic  ENT: Unremarkable  Neck: Supple, symmetrical, trachea midline  Lungs:   Clear to auscultation bilaterally, no w/r/r, respirations  unlabored without use of accessory muscles.  Chest Wall:  No tenderness or deformity  Heart:  Regular rate and rhythm, S1, S2  normal, no murmur, rub or gallop.  Neurologic: Normal affect, no gross deficits.  Labs: Results for orders placed or performed during the hospital encounter of 06/17/14 (from the past 48 hour(s))  CBC with Differential     Status: Abnormal   Collection Time: 06/17/14  8:00 AM  Result Value Ref Range   WBC 1.7 (L) 4.0 - 10.5 K/uL   RBC 2.64 (L) 4.22 - 5.81 MIL/uL   Hemoglobin 8.6 (L) 13.0 - 17.0 g/dL   HCT 24.2 (L) 39.0 - 52.0 %   MCV 91.7 78.0 - 100.0 fL   MCH 32.6 26.0 - 34.0 pg   MCHC 35.5 30.0 - 36.0 g/dL   RDW 17.3 (H) 11.5 - 15.5 %   Platelets 24 (LL) 150 - 400 K/uL    Comment: SPECIMEN CHECKED FOR CLOTS REPEATED TO VERIFY PLATELET COUNT CONFIRMED BY SMEAR CRITICAL RESULT CALLED TO, READ BACK BY AND VERIFIED WITH: S. COLLINS RN AT 0830 ON 12.7.15 BY SHUEA    Neutrophils Relative % 21 (L) 43 - 77 %   Lymphocytes Relative 67 (H) 12 - 46 %   Monocytes Relative 7 3 - 12 %   Eosinophils Relative 4 0 - 5 %   Basophils Relative 1 0 - 1 %   Neutro Abs 0.4 (L) 1.7 - 7.7 K/uL   Lymphs Abs 1.1 0.7 - 4.0 K/uL   Monocytes Absolute 0.1 0.1 - 1.0 K/uL   Eosinophils Absolute 0.1 0.0 - 0.7 K/uL   Basophils Absolute 0.0 0.0 - 0.1 K/uL   RBC Morphology POLYCHROMASIA PRESENT   Protime-INR     Status: None   Collection Time: 06/17/14  8:00 AM  Result Value Ref Range   Prothrombin Time 14.5 11.6 - 15.2 seconds   INR 1.11 0.00 - 1.49  APTT     Status: None   Collection Time: 06/17/14  8:00 AM  Result Value Ref Range   aPTT 31 24 - 37 seconds    Imaging: No results found.  Assessment/Plan Pancytopenia For CT guided bone marrow biopsy today Explained procedure, risks, complications, use of sedation Labs reviewed, ok Consent signed in chart  Ascencion Dike PA-C 06/17/2014, 8:51 AM

## 2014-06-17 NOTE — Procedures (Signed)
CT-guided  R iliac bone marrow aspiration and core biopsy No complication No blood loss. See complete dictation in [facility] PACS  

## 2014-06-17 NOTE — Telephone Encounter (Signed)
Patiet called to schedule flush appt. I have called the patient back, unable to leave voice mail message

## 2014-06-17 NOTE — Progress Notes (Signed)
78 year old male diagnosed with multiple myeloma.  He is a patient of Dr. Benay Spice.  Past medical history includes GERD, essential hypertension, BPH, diverticulosis, PUD.  Medications include Lipitor, Klonopin, Lasix, lactulose, Protonix, MiraLax, and Compazine.  Labs include sodium 133, glucose 153, creatinine 1.4, and albumin 3.1 on November 24.  Height: 6 feet 0 inches. Weight: 245.5 pounds.   Usual body weight: 270 pounds June 2015. BMI: 33.29.  Patient ports fair appetite.  He eats better when it is something he likes. Appears to be sensitive to "salty" foods and does not like them. Patient reports weight loss. Patient complains of constipation, but does not have any nausea or vomiting. Patient has been trying to improve his diet by eating less sodium.  Nutrition diagnosis: Unintended weight loss related to diagnosis of multiple myeloma as evidenced by 25 pound weight loss.  Intervention: Patient educated to consume adequate calories and protein in small, frequent meals and snacks. Patient encouraged to attempt weight maintenance. Educated patient on strategies for improving constipation.  Encouraged increased hydration. Provided samples of no added sugar Carnation breakfast essentials, Glucerna, and boost glucose control. Fact sheets were provided on increasing protein and constipation. Questions were answered.  Teach back method used.  Contact information was provided.  Monitoring, evaluation, goals: Patient will tolerate adequate calories and protein to maintain weight, during treatment.  Next visit:patient to contact me for questions.  **Disclaimer: This note was dictated with voice recognition software. Similar sounding words can inadvertently be transcribed and this note may contain transcription errors which may not have been corrected upon publication of note.**

## 2014-06-17 NOTE — Progress Notes (Signed)
CRITICAL VALUE ALERT  Critical value received:  0830  Date of notification:  06/17/14 Time of notification:  0830  Critical value read back:Yes.    Nurse who received alert:  Jonna Clark RN   MD notified (1st page): Dr. Vernard Gambles   Time of first page:  [phone]  MD notified (2nd page):  Time of second page:  Responding MD:  Dr. Vernard Gambles  Time MD responded:  [phone]  Platelets 24.

## 2014-06-18 ENCOUNTER — Telehealth: Payer: Self-pay | Admitting: Nurse Practitioner

## 2014-06-18 ENCOUNTER — Telehealth: Payer: Self-pay | Admitting: *Deleted

## 2014-06-18 NOTE — Telephone Encounter (Signed)
Confirm appt for 06/20/14 & 06/24/14.

## 2014-06-18 NOTE — Telephone Encounter (Signed)
Per staff message and POF I have scheduled appts. Advised scheduler of appts. No available on 12/10 for treament, moved to 12/14. JMW

## 2014-06-19 ENCOUNTER — Other Ambulatory Visit: Payer: Self-pay | Admitting: Nurse Practitioner

## 2014-06-20 ENCOUNTER — Other Ambulatory Visit (HOSPITAL_BASED_OUTPATIENT_CLINIC_OR_DEPARTMENT_OTHER): Payer: Medicare HMO

## 2014-06-20 ENCOUNTER — Telehealth: Payer: Self-pay | Admitting: Oncology

## 2014-06-20 ENCOUNTER — Ambulatory Visit (HOSPITAL_BASED_OUTPATIENT_CLINIC_OR_DEPARTMENT_OTHER): Payer: Medicare HMO | Admitting: Nurse Practitioner

## 2014-06-20 VITALS — BP 110/56 | HR 96 | Temp 98.4°F | Resp 18 | Ht 72.0 in | Wt 247.7 lb

## 2014-06-20 DIAGNOSIS — D61818 Other pancytopenia: Secondary | ICD-10-CM

## 2014-06-20 DIAGNOSIS — K5900 Constipation, unspecified: Secondary | ICD-10-CM

## 2014-06-20 DIAGNOSIS — Z86718 Personal history of other venous thrombosis and embolism: Secondary | ICD-10-CM

## 2014-06-20 DIAGNOSIS — C9000 Multiple myeloma not having achieved remission: Secondary | ICD-10-CM

## 2014-06-20 DIAGNOSIS — D696 Thrombocytopenia, unspecified: Secondary | ICD-10-CM

## 2014-06-20 LAB — HOLD TUBE, BLOOD BANK

## 2014-06-20 LAB — CBC WITH DIFFERENTIAL/PLATELET
BASO%: 0.8 % (ref 0.0–2.0)
Basophils Absolute: 0 10*3/uL (ref 0.0–0.1)
EOS ABS: 0.1 10*3/uL (ref 0.0–0.5)
EOS%: 2.3 % (ref 0.0–7.0)
HEMATOCRIT: 24.4 % — AB (ref 38.4–49.9)
HGB: 8.5 g/dL — ABNORMAL LOW (ref 13.0–17.1)
LYMPH%: 58.8 % — ABNORMAL HIGH (ref 14.0–49.0)
MCH: 31.7 pg (ref 27.2–33.4)
MCHC: 34.8 g/dL (ref 32.0–36.0)
MCV: 91 fL (ref 79.3–98.0)
MONO#: 0.2 10*3/uL (ref 0.1–0.9)
MONO%: 6.5 % (ref 0.0–14.0)
NEUT%: 31.6 % — AB (ref 39.0–75.0)
NEUTROS ABS: 0.8 10*3/uL — AB (ref 1.5–6.5)
NRBC: 0 % (ref 0–0)
Platelets: 26 10*3/uL — ABNORMAL LOW (ref 140–400)
RBC: 2.68 10*6/uL — AB (ref 4.20–5.82)
RDW: 18.3 % — ABNORMAL HIGH (ref 11.0–14.6)
WBC: 2.6 10*3/uL — AB (ref 4.0–10.3)
lymph#: 1.5 10*3/uL (ref 0.9–3.3)

## 2014-06-20 LAB — TECHNOLOGIST REVIEW

## 2014-06-20 NOTE — Telephone Encounter (Signed)
gv and printed appt sched adn avs for pt for DEC....sent msg to tiff to send all records to [facility]

## 2014-06-20 NOTE — Progress Notes (Addendum)
[place] OFFICE PROGRESS NOTE   Diagnosis:  Multiple myeloma  INTERVAL HISTORY:   [name] returns as scheduled. He denies fever. No bleeding. Stable dyspnea on exertion. He reports a 2 month history of "drainage" from the right eye.  Objective:  Vital signs in last 24 hours:  Blood pressure 110/56, pulse 96, temperature 98.4 F (36.9 C), temperature source Oral, resp. rate 18, height 6' (1.829 m), weight 247 lb 11.2 oz (112.356 kg), SpO2 97 %.    HEENT: mild conjunctival erythema on the right. No thrush or ulcers. Resp: lungs clear bilaterally. Cardio: regular rate and rhythm. GI: abdomen soft and nontender. No organomegaly. Vascular: trace lower leg edema bilaterally. Neuro: alert and oriented.     Lab Results:  Lab Results  Component Value Date   WBC 2.6* 06/20/2014   HGB 8.5* 06/20/2014   HCT 24.4* 06/20/2014   MCV 91.0 06/20/2014   PLT 26* 06/20/2014   NEUTROABS 0.8* 06/20/2014    Imaging:  No results found.  Medications: I have reviewed the patient's current medications.  Assessment/Plan: 1. Multiple myeloma, status post treatment with melphalan/prednisone/thalidomide beginning in November 2009. The thalidomide was increased to 200 mg daily beginning 09/11/2008. The serum M spike was slightly improved on 03/18/2009 and slightly increased on 05/09/2009. He has been maintained off of specific therapy for multiple myeloma since September 2010. The serum M spike was increased on 10/20/2011 at 4.6. He began Revlimid on 11/11/2011 with weekly dexamethasone. He began cycle 2 of Revlimid on 12/09/2011. The serum M spike was improved on 12/31/2011. The serum M spike was slightly higher on 01/28/2012. He began cycle 4 on 02/03/2012. He developed recurrent hypercalcemia. Treatment was changed to Cytoxan/Velcade/Decadron beginning 03/02/2012. Cytoxan discontinued in January 2015  Treatment continued with Velcade/Decadron.   Stable serum M spike and IgG  10/04/2013.   Stable serum M spike 12/06/2013   Treatment placed on hold 01/10/2014 secondary to thrombocytopenia and a slightly higher M spike   Serum M spike and IgG increased 03/07/2014.  Cycle 1 Pomalidomide/dexamethasone 04/01/2014.  Pomalidomide subsequently placed on hold due to cytopenias.  Pomalidomide resumed at a reduced dose 05/23/2014.  Pomalidomide placed on hold 06/04/2014 due to progressive pancytopenia. 2. Chronic "dizziness."  3. Chest x-ray 11/01/2008 with a patchy opacity at the right midlung suspicious for pneumonia. He was treated with Avelox. 4. Low back pain with a radicular component. An MRI on 10/07/2008 showed no involvement of the lumbosacral spine with myeloma. 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 5. Bilateral neck pain, status post a CT 05/17/2008 with findings of spinal stenosis and osteoarthritis. 6. 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 Red cell microcytosis, likely related to hemoglobin C trait. A ferritin level returned elevated and a stool Hemoccult was negative 01/23/2010. He underwent a colonoscopy in 2009. 8. Elevated beta-2 microglobulin level. 9. History of hypertension. 10. History of a herniated disk at L4-L5 on MRI an 01/11/2002. 11. Hyperlipidemia. 12. Gastroesophageal reflux disease. 13. History of atypical angina. 14. History of rectal bleeding-large external hemorrhoids noted 02/25/2012. The stool was Hemoccult positive. He has a history of colon polyps. He is followed by [facility]. He was diagnosed with hemorrhoids in September of 2013 while hospitalized. He underwent a band procedure 03/09/2013. He has had no further rectal bleeding. 15. Superficial venous thrombosis of the greater saphenous vein on a Doppler 09/27/2008. Negative for deep  vein thrombosis. Symptoms  improved with aspirin. 16. Neck pain with numbness/tingling in the arms, hands, and low back with proximal right leg weakness. An MRI of the cervical spine 05/30/2009 showed multilevel spondylosis with mild cord edema at C4-C5 and C5-C6. He was noted to have an enhancing lesion of the cord at T3-T4 of unclear etiology. In the lumbar spine there was no change in the spondylosis at L3-L4 and L4-L5. There was no involvement of the cervical or lumbar spine with myeloma. He is status post C4-C5, C5-C6, and C6-C7 anterior cervical diskectomy with fusion by Dr. Annette Stable 08/01/2009. 17. Thrombocytopenia-progressive, likely secondary to progression of multiple myeloma 18. Indeterminate-age deep vein thrombosis of the left lower extremity on a venous Doppler 08/15/2009. There were also findings consistent with superficial thrombosis involving the right lower extremity 08/15/2009. Coumadin was discontinued in October 2011. 19. Type 2 diabetes. Now on an oral hypoglycemic 20. History of hematuria, followed at [facility]. Urinalysis was negative for blood on 07/23/2011. 21. Radicular back pain with MRI of the lumbar spine on 10/19/2011 showing nerve root impingement at L2-L3, L3-L4 and L4-L5 with the most severe at the right L3 nerve roots due to a large disc fragment. Associated right leg weakness. He underwent right L2-3 decompressive laminotomy with right L2 and L3 decompressive foraminotomy; right L2-3 microdiscectomy on 11/01/2011. 22. Constipation. He continues a laxative regimen. 23. Hospitalization 11/08/2011 through 11/10/2011 with orthostatic hypotension/dizziness. He improved with intravenous hydration. He had mild hypotension when here on 11/18/2011. We recommended discontinuing Norvasc and Proscar. 24. Hypercalcemia status post pamidronate 11/03/2011. Recurrent hypercalcemia 02/16/2012 status post Zometa. The calcium has remained in normal range. 25. Fractured tooth with associated pain. Status post a  tooth extraction by Dr. Enrique Sack, Zometa was placed on hold. 26. Exertional dyspnea. Stable. He was referred to cardiology, pulmonary function studies 06/15/2013 showed a moderately severe diffusion defect. He utilizes supplemental oxygen as needed. He is followed by pulmonary. 27. 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 28. 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 Cytogenetics pending   Disposition: [name] has persistent pancytopenia. The bone marrow biopsy showed MDS/RAEB-2. Dr. Benay Spice reviewed the bone marrow findings with [name] and his wife and recommends a referral to Dr. Florene Glen at [facility]. [name] is in agreement.  He will return in one week for labs and 2 weeks for labs and an office visit. He understands to contact the office in the interim with fever, chills, other signs of infection, bruising/bleeding.  Patient seen with Dr. Benay Spice.    Ned Card ANP/GNP-BC   06/20/2014  11:32 AM  This was a shared visit with Ned Card. I discussed the bone marrow findings with [name] and his wife. He has multiple myeloma, but I suspect the pancytopenia is related to myelodysplasia. We discontinued pomalidomide. I will refer him to Dr. Florene Glen to consider treatment options. He may be a candidate for azacytidine or decitabine therapy.  We will check a weekly CBC and provide transfusion support as indicated.   Julieanne Manson, M.D.

## 2014-06-21 ENCOUNTER — Telehealth: Payer: Self-pay | Admitting: *Deleted

## 2014-06-21 NOTE — Telephone Encounter (Signed)
[name] has made multiple calls to office today "returning our call". Made her aware that this nurse, nor NP has called her.  No documentation of a call made to her from [facility]. Informed her it may have been medical records in regards to the referral to [facility]. Suggested she give them till mid week to contact her with the appointment. Printed referral sheet and placed on desk of [employer]. In medical records.

## 2014-06-22 ENCOUNTER — Other Ambulatory Visit: Payer: Self-pay | Admitting: Internal Medicine

## 2014-06-24 ENCOUNTER — Ambulatory Visit: Payer: Self-pay

## 2014-06-24 ENCOUNTER — Other Ambulatory Visit: Payer: Self-pay | Admitting: Family Medicine

## 2014-06-24 LAB — CHROMOSOME ANALYSIS, BONE MARROW

## 2014-06-25 ENCOUNTER — Telehealth: Payer: Self-pay | Admitting: *Deleted

## 2014-06-25 NOTE — Telephone Encounter (Signed)
Called Mrs. Kennon Rounds with phone # to contact [place] with Dr. Florene Glen and let them know which time he wants on 07/02/14--0800 or 12:00. They will tell her what he needs to bring and directions/may be mailed to home.  Printed requested records and gave to HIM department to fax today.

## 2014-06-25 NOTE — Telephone Encounter (Signed)
Called referral to [facility] to see Dr. Drusilla Kanner to be this month. Dr. Florene Glen can work him on 12/22 at 0800 or 1200. Debbie needs wife to call her to confirm when they prefer and she will provide her with further information regarding appointment. Jackelyn Poling will send request form for path slides to be FedEx'd to them overnight.  Dr. Florene Glen is good at looking at [place], but prefers most recent info and treatment summary be faxed to [phone].

## 2014-06-26 ENCOUNTER — Other Ambulatory Visit (HOSPITAL_COMMUNITY): Payer: Self-pay

## 2014-06-26 ENCOUNTER — Ambulatory Visit (HOSPITAL_COMMUNITY): Payer: Medicare HMO

## 2014-06-26 ENCOUNTER — Telehealth: Payer: Self-pay | Admitting: Oncology

## 2014-06-26 NOTE — Telephone Encounter (Signed)
Medical records faxed to Dr. Jerrye Noble @ [phone]

## 2014-07-01 ENCOUNTER — Ambulatory Visit: Payer: Self-pay

## 2014-07-03 ENCOUNTER — Encounter (HOSPITAL_COMMUNITY): Payer: Self-pay

## 2014-07-04 ENCOUNTER — Telehealth: Payer: Self-pay | Admitting: *Deleted

## 2014-07-04 ENCOUNTER — Other Ambulatory Visit (HOSPITAL_BASED_OUTPATIENT_CLINIC_OR_DEPARTMENT_OTHER): Payer: Medicare HMO

## 2014-07-04 ENCOUNTER — Telehealth: Payer: Self-pay | Admitting: Nurse Practitioner

## 2014-07-04 ENCOUNTER — Ambulatory Visit (HOSPITAL_BASED_OUTPATIENT_CLINIC_OR_DEPARTMENT_OTHER): Payer: Medicare HMO | Admitting: Nurse Practitioner

## 2014-07-04 ENCOUNTER — Encounter: Payer: Self-pay | Admitting: Nurse Practitioner

## 2014-07-04 VITALS — BP 97/49 | HR 91 | Temp 98.1°F | Resp 18 | Ht 72.0 in | Wt 241.4 lb

## 2014-07-04 DIAGNOSIS — D469 Myelodysplastic syndrome, unspecified: Secondary | ICD-10-CM

## 2014-07-04 DIAGNOSIS — C9000 Multiple myeloma not having achieved remission: Secondary | ICD-10-CM

## 2014-07-04 DIAGNOSIS — D61818 Other pancytopenia: Secondary | ICD-10-CM

## 2014-07-04 LAB — CBC WITH DIFFERENTIAL/PLATELET
BASO%: 0.4 % (ref 0.0–2.0)
BASOS ABS: 0 10*3/uL (ref 0.0–0.1)
EOS ABS: 0.1 10*3/uL (ref 0.0–0.5)
EOS%: 3.2 % (ref 0.0–7.0)
HCT: 24.3 % — ABNORMAL LOW (ref 38.4–49.9)
HEMOGLOBIN: 8.6 g/dL — AB (ref 13.0–17.1)
LYMPH%: 62.5 % — AB (ref 14.0–49.0)
MCH: 32.2 pg (ref 27.2–33.4)
MCHC: 35.4 g/dL (ref 32.0–36.0)
MCV: 91 fL (ref 79.3–98.0)
MONO#: 0.2 10*3/uL (ref 0.1–0.9)
MONO%: 8.4 % (ref 0.0–14.0)
NEUT%: 25.5 % — ABNORMAL LOW (ref 39.0–75.0)
NEUTROS ABS: 0.7 10*3/uL — AB (ref 1.5–6.5)
PLATELETS: 52 10*3/uL — AB (ref 140–400)
RBC: 2.67 10*6/uL — ABNORMAL LOW (ref 4.20–5.82)
RDW: 19.1 % — ABNORMAL HIGH (ref 11.0–14.6)
WBC: 2.9 10*3/uL — AB (ref 4.0–10.3)
lymph#: 1.8 10*3/uL (ref 0.9–3.3)
nRBC: 0 % (ref 0–0)

## 2014-07-04 LAB — TECHNOLOGIST REVIEW: Technologist Review: 6

## 2014-07-04 LAB — HOLD TUBE, BLOOD BANK

## 2014-07-04 NOTE — Telephone Encounter (Signed)
, °

## 2014-07-04 NOTE — Telephone Encounter (Signed)
Per Dr. Benay Spice, call patient to hold his Norvasc-BP running low/not needed right now. Attempted to reach him at home/cell # and no answer and voice mail has not been set up. Called contact # for daughter, Jaydrien Wassenaar and left VM asking her to tell Kore to hold his Norvasc.

## 2014-07-04 NOTE — Progress Notes (Addendum)
[facility] OFFICE PROGRESS NOTE   Diagnosis:  Multiple myeloma, MDS  INTERVAL HISTORY:   [name] returns as scheduled. He denies fever. No shaking chills. He noted a small amount of hemorrhoidal bleeding this morning. No other bleeding. He reports a good appetite. He has stable dyspnea on exertion. No cough. No dysuria.  Objective:  Vital signs in last 24 hours:  Blood pressure 97/49, pulse 91, temperature 98.1 F (36.7 C), temperature source Oral, resp. rate 18, height 6' (1.829 m), weight 241 lb 6.4 oz (109.498 kg), SpO2 99 %.    HEENT: No thrush or ulcers. Resp: Lungs clear bilaterally. Cardio: Regular rate and rhythm. GI: Abdomen soft and nontender. No organomegaly. Vascular: No leg edema. Neuro: Alert and oriented.    Lab Results:  Lab Results  Component Value Date   WBC 2.9* 07/04/2014   HGB 8.6* 07/04/2014   HCT 24.3* 07/04/2014   MCV 91.0 07/04/2014   PLT 52* 07/04/2014   NEUTROABS 0.7* 07/04/2014    Imaging:  No results found.  Medications: I have reviewed the patient's current medications.  Assessment/Plan: 1. Multiple myeloma, status post treatment with melphalan/prednisone/thalidomide beginning in November 2009. The thalidomide was increased to 200 mg daily beginning 09/11/2008. The serum M spike was slightly improved on 03/18/2009 and slightly increased on 05/09/2009. He has been maintained off of specific therapy for multiple myeloma since September 2010. The serum M spike was increased on 10/20/2011 at 4.6. He began Revlimid on 11/11/2011 with weekly dexamethasone. He began cycle 2 of Revlimid on 12/09/2011. The serum M spike was improved on 12/31/2011. The serum M spike was slightly higher on 01/28/2012. He began cycle 4 on 02/03/2012. He developed recurrent hypercalcemia. Treatment was changed to Cytoxan/Velcade/Decadron beginning 03/02/2012. Cytoxan discontinued in January 2015  Treatment continued with Velcade/Decadron.   Stable  serum M spike and IgG 10/04/2013.   Stable serum M spike 12/06/2013   Treatment placed on hold 01/10/2014 secondary to thrombocytopenia and a slightly higher M spike   Serum M spike and IgG increased 03/07/2014.  Cycle 1 Pomalidomide/dexamethasone 04/01/2014.  Pomalidomide subsequently placed on hold due to cytopenias.  Pomalidomide resumed at a reduced dose 05/23/2014.  Pomalidomide placed on hold 06/04/2014 due to progressive pancytopenia. 2. Chronic "dizziness."  3. Chest x-ray 11/01/2008 with a patchy opacity at the right midlung suspicious for pneumonia. He was treated with Avelox. 4. Low back pain with a radicular component. An MRI on 10/07/2008 showed no involvement of the lumbosacral spine with myeloma. 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 5. Bilateral neck pain, status post a CT 05/17/2008 with findings of spinal stenosis and osteoarthritis. 6. 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 Red cell microcytosis, likely related to hemoglobin C trait. A ferritin level returned elevated and a stool Hemoccult was negative 01/23/2010. He underwent a colonoscopy in 2009. 8. Elevated beta-2 microglobulin level. 9. History of hypertension. 10. History of a herniated disk at L4-L5 on MRI an 01/11/2002. 11. Hyperlipidemia. 12. Gastroesophageal reflux disease. 13. History of atypical angina. 14. History of rectal bleeding-large external hemorrhoids noted 02/25/2012. The stool was Hemoccult positive. He has a history of colon polyps. He is followed by [facility]. He was diagnosed with hemorrhoids in September of 2013 while hospitalized. He underwent a band procedure 03/09/2013. He has had no further rectal bleeding. 15. Superficial venous thrombosis of the greater saphenous vein on a  Doppler 09/27/2008. Negative for deep vein  thrombosis. Symptoms improved with aspirin. 16. Neck pain with numbness/tingling in the arms, hands, and low back with proximal right leg weakness. An MRI of the cervical spine 05/30/2009 showed multilevel spondylosis with mild cord edema at C4-C5 and C5-C6. He was noted to have an enhancing lesion of the cord at T3-T4 of unclear etiology. In the lumbar spine there was no change in the spondylosis at L3-L4 and L4-L5. There was no involvement of the cervical or lumbar spine with myeloma. He is status post C4-C5, C5-C6, and C6-C7 anterior cervical diskectomy with fusion by Dr. Annette Stable 08/01/2009. 17. Thrombocytopenia-progressive, likely secondary to progression of multiple myeloma 18. Indeterminate-age deep vein thrombosis of the left lower extremity on a venous Doppler 08/15/2009. There were also findings consistent with superficial thrombosis involving the right lower extremity 08/15/2009. Coumadin was discontinued in October 2011. 19. Type 2 diabetes. Now on an oral hypoglycemic 20. History of hematuria, followed at [facility]. Urinalysis was negative for blood on 07/23/2011. 21. Radicular back pain with MRI of the lumbar spine on 10/19/2011 showing nerve root impingement at L2-L3, L3-L4 and L4-L5 with the most severe at the right L3 nerve roots due to a large disc fragment. Associated right leg weakness. He underwent right L2-3 decompressive laminotomy with right L2 and L3 decompressive foraminotomy; right L2-3 microdiscectomy on 11/01/2011. 22. Constipation. He continues a laxative regimen. 23. Hospitalization 11/08/2011 through 11/10/2011 with orthostatic hypotension/dizziness. He improved with intravenous hydration. He had mild hypotension when here on 11/18/2011. We recommended discontinuing Norvasc and Proscar. 24. Hypercalcemia status post pamidronate 11/03/2011. Recurrent hypercalcemia 02/16/2012 status post Zometa. The calcium has remained in normal range. 25. Fractured tooth with associated  pain. Status post a tooth extraction by Dr. Enrique Sack, Zometa was placed on hold. 26. Exertional dyspnea. Stable. He was referred to cardiology, pulmonary function studies 06/15/2013 showed a moderately severe diffusion defect. He utilizes supplemental oxygen as needed. He is followed by pulmonary. 27. 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 28. 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   Disposition: [name] appears stable. He has persistent pancytopenia due to MDS. He has seen Dr. Florene Glen at [facility]. Dr. Florene Glen recommends a trial of 5-azacytidine. We reviewed potential toxicities including myelosuppression, nausea/vomiting, injection site reaction, mouth sores, constipation or diarrhea. Written information was provided as well. He understands he will likely require red cell and platelet transfusion support.  The plan is to begin cycle 1 daily for 5 days beginning 07/08/2014. We will check blood counts on a weekly basis for now. We will see him in follow-up on 07/22/2013. He understands to contact the office in the interim with fever, chills, other signs of infection, bruising/bleeding.  Patient seen with Dr. Benay Spice. 25 minutes were spent face-to-face at today's visit with the majority of the time involved in counseling/coordination of care.  Ned Card ANP/GNP-BC   07/04/2014  12:08 PM  This was a shared visit with Ned Card. The bone marrow biopsy confirmed myelodysplasia with excess blasts. He saw Dr. Florene Glen. I discussed the case with Dr. Florene Glen. He recommends treatment with 5 azacytidine. [name] understands this treatment will not be curative. Hopefully the pancytopenia will improve, but in the short-term we expect  progressive pancytopenia. The plan is to proceed with a trial of 5  azacytidine beginning 07/08/2014. Treatment for multiple myeloma will remain on hold.  Julieanne Manson, M.D.

## 2014-07-04 NOTE — Telephone Encounter (Signed)
Per staff message and POF I have scheduled appts. Advised scheduler of appts. JMW  

## 2014-07-04 NOTE — Patient Instructions (Signed)
Azacitidine suspension for injection (subcutaneous use) What is this medicine? AZACITIDINE (ay [place]) is a chemotherapy drug. This medicine reduces the growth of cancer cells and can suppress the immune system. It is used for treating myelodysplastic syndrome or some types of leukemia. This medicine may be used for other purposes; ask your health care provider or pharmacist if you have questions. COMMON BRAND NAME(S): Vidaza What should I tell my health care provider before I take this medicine? They need to know if you have any of these conditions: -infection (especially a virus infection such as chickenpox, cold sores, or herpes) -kidney disease -liver disease -liver tumors -an unusual or allergic reaction to azacitidine, mannitol, other medicines, foods, dyes, or preservatives -pregnant or trying to get pregnant -breast-feeding How should I use this medicine? This medicine is for injection under the skin. It is administered in a hospital or clinic by a specially trained health care professional. Talk to your pediatrician regarding the use of this medicine in children. While this drug may be prescribed for selected conditions, precautions do apply. Overdosage: If you think you have taken too much of this medicine contact a poison control center or emergency room at once. NOTE: This medicine is only for you. Do not share this medicine with others. What if I miss a dose? It is important not to miss your dose. Call your doctor or health care professional if you are unable to keep an appointment. What may interact with this medicine? -vaccines Talk to your doctor or health care professional before taking any of these medicines: -acetaminophen -aspirin -ibuprofen -ketoprofen -naproxen This list may not describe all possible interactions. Give your health care provider a list of all the medicines, herbs, non-prescription drugs, or dietary supplements you use. Also tell them if you  smoke, drink alcohol, or use illegal drugs. Some items may interact with your medicine. What should I watch for while using this medicine? Visit your doctor for checks on your progress. This drug may make you feel generally unwell. This is not uncommon, as chemotherapy can affect healthy cells as well as cancer cells. Report any side effects. Continue your course of treatment even though you feel ill unless your doctor tells you to stop. In some cases, you may be given additional medicines to help with side effects. Follow all directions for their use. Call your doctor or health care professional for advice if you get a fever, chills or sore throat, or other symptoms of a cold or flu. Do not treat yourself. This drug decreases your body's ability to fight infections. Try to avoid being around people who are sick. This medicine may increase your risk to bruise or bleed. Call your doctor or health care professional if you notice any unusual bleeding. Be careful brushing and flossing your teeth or using a toothpick because you may get an infection or bleed more easily. If you have any dental work done, tell your dentist you are receiving this medicine. Avoid taking products that contain aspirin, acetaminophen, ibuprofen, naproxen, or ketoprofen unless instructed by your doctor. These medicines may hide a fever. Do not have any vaccinations without your doctor's approval and avoid anyone who has recently had oral polio vaccine. Do not become pregnant while taking this medicine. Women should inform their doctor if they wish to become pregnant or think they might be pregnant. There is a potential for serious side effects to an unborn child. Talk to your health care professional or pharmacist for more information.  Do not breast-feed an infant while taking this medicine. If you are a man, you should not father a child while receiving treatment. What side effects may I notice from receiving this medicine? Side  effects that you should report to your doctor or health care professional as soon as possible: -allergic reactions like skin rash, itching or hives, swelling of the face, lips, or tongue -low blood counts - this medicine may decrease the number of white blood cells, red blood cells and platelets. You may be at increased risk for infections and bleeding. -signs of infection - fever or chills, cough, sore throat, pain or difficulty passing urine -signs of decreased platelets or bleeding - bruising, pinpoint red spots on the skin, black, tarry stools, blood in the urine -signs of decreased red blood cells - unusually weak or tired, fainting spells, lightheadedness -reactions at the injection site including redness, pain, itching, or bruising -breathing problems -changes in vision -fever -mouth sores -stomach pain -vomiting Side effects that usually do not require medical attention (report to your doctor or health care professional if they continue or are bothersome): -constipation -diarrhea -loss of appetite -nausea -pain or redness at the injection site -weak or tired This list may not describe all possible side effects. Call your doctor for medical advice about side effects. You may report side effects to FDA at 1-800-FDA-1088. Where should I keep my medicine? This drug is given in a hospital or clinic and will not be stored at home. NOTE: This sheet is a summary. It may not cover all possible information. If you have questions about this medicine, talk to your doctor, pharmacist, or health care provider.  2015, [employer]. (2007-09-21 11:04:07)

## 2014-07-07 ENCOUNTER — Other Ambulatory Visit: Payer: Self-pay | Admitting: Family Medicine

## 2014-07-08 ENCOUNTER — Other Ambulatory Visit: Payer: Self-pay | Admitting: Nurse Practitioner

## 2014-07-08 ENCOUNTER — Telehealth: Payer: Self-pay | Admitting: Oncology

## 2014-07-08 ENCOUNTER — Ambulatory Visit: Payer: Self-pay

## 2014-07-08 ENCOUNTER — Ambulatory Visit (HOSPITAL_BASED_OUTPATIENT_CLINIC_OR_DEPARTMENT_OTHER): Payer: Medicare HMO

## 2014-07-08 ENCOUNTER — Other Ambulatory Visit (HOSPITAL_BASED_OUTPATIENT_CLINIC_OR_DEPARTMENT_OTHER): Payer: Medicare HMO

## 2014-07-08 DIAGNOSIS — D469 Myelodysplastic syndrome, unspecified: Secondary | ICD-10-CM

## 2014-07-08 DIAGNOSIS — C9000 Multiple myeloma not having achieved remission: Secondary | ICD-10-CM

## 2014-07-08 DIAGNOSIS — Z5111 Encounter for antineoplastic chemotherapy: Secondary | ICD-10-CM

## 2014-07-08 LAB — COMPREHENSIVE METABOLIC PANEL (CC13)
ALBUMIN: 3.1 g/dL — AB (ref 3.5–5.0)
ALT: 12 U/L (ref 0–55)
AST: 18 U/L (ref 5–34)
Alkaline Phosphatase: 54 U/L (ref 40–150)
Anion Gap: 7 mEq/L (ref 3–11)
BUN: 12.9 mg/dL (ref 7.0–26.0)
CO2: 25 mEq/L (ref 22–29)
Calcium: 8.8 mg/dL (ref 8.4–10.4)
Chloride: 104 mEq/L (ref 98–109)
Creatinine: 1.1 mg/dL (ref 0.7–1.3)
EGFR: 75 mL/min/{1.73_m2} — AB (ref >90)
GLUCOSE: 120 mg/dL (ref 70–140)
POTASSIUM: 4.1 meq/L (ref 3.5–5.1)
Sodium: 136 mEq/L (ref 136–145)
Total Bilirubin: 0.53 mg/dL (ref 0.20–1.20)
Total Protein: 7.7 g/dL (ref 6.4–8.3)

## 2014-07-08 LAB — CBC WITH DIFFERENTIAL/PLATELET
BASO%: 0.4 % (ref 0.0–2.0)
Basophils Absolute: 0 10*3/uL (ref 0.0–0.1)
EOS ABS: 0.1 10*3/uL (ref 0.0–0.5)
EOS%: 3.5 % (ref 0.0–7.0)
HEMATOCRIT: 22.5 % — AB (ref 38.4–49.9)
HEMOGLOBIN: 7.9 g/dL — AB (ref 13.0–17.1)
LYMPH%: 60.2 % — AB (ref 14.0–49.0)
MCH: 32.1 pg (ref 27.2–33.4)
MCHC: 35.1 g/dL (ref 32.0–36.0)
MCV: 91.5 fL (ref 79.3–98.0)
MONO#: 0.3 10*3/uL (ref 0.1–0.9)
MONO%: 10.9 % (ref 0.0–14.0)
NEUT#: 0.7 10*3/uL — ABNORMAL LOW (ref 1.5–6.5)
NEUT%: 25 % — ABNORMAL LOW (ref 39.0–75.0)
NRBC: 0 % (ref 0–0)
Platelets: 44 10*3/uL — ABNORMAL LOW (ref 140–400)
RBC: 2.46 10*6/uL — ABNORMAL LOW (ref 4.20–5.82)
RDW: 19.3 % — ABNORMAL HIGH (ref 11.0–14.6)
WBC: 2.8 10*3/uL — ABNORMAL LOW (ref 4.0–10.3)
lymph#: 1.7 10*3/uL (ref 0.9–3.3)

## 2014-07-08 LAB — TECHNOLOGIST REVIEW

## 2014-07-08 MED ORDER — AZACITIDINE CHEMO SQ INJECTION
76.0000 mg/m2 | Freq: Once | INTRAMUSCULAR | Status: AC
  Administered 2014-07-08: 180 mg via SUBCUTANEOUS
  Filled 2014-07-08: qty 7.2

## 2014-07-08 MED ORDER — ONDANSETRON HCL 8 MG PO TABS
ORAL_TABLET | ORAL | Status: AC
  Filled 2014-07-08: qty 1

## 2014-07-08 MED ORDER — ONDANSETRON HCL 8 MG PO TABS
8.0000 mg | ORAL_TABLET | Freq: Once | ORAL | Status: AC
  Administered 2014-07-08: 8 mg via ORAL

## 2014-07-08 NOTE — Telephone Encounter (Signed)
S/w pt's wife confirmed labs added per 12/24 POF..... KJ

## 2014-07-08 NOTE — Patient Instructions (Signed)
Azacitidine suspension for injection (subcutaneous use) What is this medicine? AZACITIDINE (ay [place]) is a chemotherapy drug. This medicine reduces the growth of cancer cells and can suppress the immune system. It is used for treating myelodysplastic syndrome or some types of leukemia. This medicine may be used for other purposes; ask your health care provider or pharmacist if you have questions. COMMON BRAND NAME(S): Vidaza What should I tell my health care provider before I take this medicine? They need to know if you have any of these conditions: -infection (especially a virus infection such as chickenpox, cold sores, or herpes) -kidney disease -liver disease -liver tumors -an unusual or allergic reaction to azacitidine, mannitol, other medicines, foods, dyes, or preservatives -pregnant or trying to get pregnant -breast-feeding How should I use this medicine? This medicine is for injection under the skin. It is administered in a hospital or clinic by a specially trained health care professional. Talk to your pediatrician regarding the use of this medicine in children. While this drug may be prescribed for selected conditions, precautions do apply. Overdosage: If you think you have taken too much of this medicine contact a poison control center or emergency room at once. NOTE: This medicine is only for you. Do not share this medicine with others. What if I miss a dose? It is important not to miss your dose. Call your doctor or health care professional if you are unable to keep an appointment. What may interact with this medicine? -vaccines Talk to your doctor or health care professional before taking any of these medicines: -acetaminophen -aspirin -ibuprofen -ketoprofen -naproxen This list may not describe all possible interactions. Give your health care provider a list of all the medicines, herbs, non-prescription drugs, or dietary supplements you use. Also tell them if you  smoke, drink alcohol, or use illegal drugs. Some items may interact with your medicine. What should I watch for while using this medicine? Visit your doctor for checks on your progress. This drug may make you feel generally unwell. This is not uncommon, as chemotherapy can affect healthy cells as well as cancer cells. Report any side effects. Continue your course of treatment even though you feel ill unless your doctor tells you to stop. In some cases, you may be given additional medicines to help with side effects. Follow all directions for their use. Call your doctor or health care professional for advice if you get a fever, chills or sore throat, or other symptoms of a cold or flu. Do not treat yourself. This drug decreases your body's ability to fight infections. Try to avoid being around people who are sick. This medicine may increase your risk to bruise or bleed. Call your doctor or health care professional if you notice any unusual bleeding. Be careful brushing and flossing your teeth or using a toothpick because you may get an infection or bleed more easily. If you have any dental work done, tell your dentist you are receiving this medicine. Avoid taking products that contain aspirin, acetaminophen, ibuprofen, naproxen, or ketoprofen unless instructed by your doctor. These medicines may hide a fever. Do not have any vaccinations without your doctor's approval and avoid anyone who has recently had oral polio vaccine. Do not become pregnant while taking this medicine. Women should inform their doctor if they wish to become pregnant or think they might be pregnant. There is a potential for serious side effects to an unborn child. Talk to your health care professional or pharmacist for more information.  Do not breast-feed an infant while taking this medicine. If you are a man, you should not father a child while receiving treatment. What side effects may I notice from receiving this medicine? Side  effects that you should report to your doctor or health care professional as soon as possible: -allergic reactions like skin rash, itching or hives, swelling of the face, lips, or tongue -low blood counts - this medicine may decrease the number of white blood cells, red blood cells and platelets. You may be at increased risk for infections and bleeding. -signs of infection - fever or chills, cough, sore throat, pain or difficulty passing urine -signs of decreased platelets or bleeding - bruising, pinpoint red spots on the skin, black, tarry stools, blood in the urine -signs of decreased red blood cells - unusually weak or tired, fainting spells, lightheadedness -reactions at the injection site including redness, pain, itching, or bruising -breathing problems -changes in vision -fever -mouth sores -stomach pain -vomiting Side effects that usually do not require medical attention (report to your doctor or health care professional if they continue or are bothersome): -constipation -diarrhea -loss of appetite -nausea -pain or redness at the injection site -weak or tired This list may not describe all possible side effects. Call your doctor for medical advice about side effects. You may report side effects to FDA at 1-800-FDA-1088. Where should I keep my medicine? This drug is given in a hospital or clinic and will not be stored at home. NOTE: This sheet is a summary. It may not cover all possible information. If you have questions about this medicine, talk to your doctor, pharmacist, or health care provider.  2015, [employer]. (2007-09-21 11:04:07)

## 2014-07-09 ENCOUNTER — Ambulatory Visit (HOSPITAL_BASED_OUTPATIENT_CLINIC_OR_DEPARTMENT_OTHER): Payer: Medicare HMO

## 2014-07-09 DIAGNOSIS — C9000 Multiple myeloma not having achieved remission: Secondary | ICD-10-CM

## 2014-07-09 DIAGNOSIS — Z5111 Encounter for antineoplastic chemotherapy: Secondary | ICD-10-CM

## 2014-07-09 DIAGNOSIS — D469 Myelodysplastic syndrome, unspecified: Secondary | ICD-10-CM

## 2014-07-09 MED ORDER — ONDANSETRON HCL 8 MG PO TABS
8.0000 mg | ORAL_TABLET | Freq: Once | ORAL | Status: AC
  Administered 2014-07-09: 8 mg via ORAL

## 2014-07-09 MED ORDER — AZACITIDINE CHEMO SQ INJECTION
180.0000 mg | Freq: Once | INTRAMUSCULAR | Status: AC
  Administered 2014-07-09: 180 mg via SUBCUTANEOUS
  Filled 2014-07-09: qty 7.2

## 2014-07-09 MED ORDER — ONDANSETRON HCL 8 MG PO TABS
ORAL_TABLET | ORAL | Status: AC
  Filled 2014-07-09: qty 1

## 2014-07-09 NOTE — Patient Instructions (Signed)
[facility] Discharge Instructions for Patients Receiving Chemotherapy  Today you received the following chemotherapy agents Vidaza  To help prevent nausea and vomiting after your treatment, we encourage you to take your nausea medication as directed.   If you develop nausea and vomiting that is not controlled by your nausea medication, call the clinic.   BELOW ARE SYMPTOMS THAT SHOULD BE REPORTED IMMEDIATELY:  *FEVER GREATER THAN 100.5 F  *CHILLS WITH OR WITHOUT FEVER  NAUSEA AND VOMITING THAT IS NOT CONTROLLED WITH YOUR NAUSEA MEDICATION  *UNUSUAL SHORTNESS OF BREATH  *UNUSUAL BRUISING OR BLEEDING  TENDERNESS IN MOUTH AND THROAT WITH OR WITHOUT PRESENCE OF ULCERS  *URINARY PROBLEMS  *BOWEL PROBLEMS  UNUSUAL RASH Items with * indicate a potential emergency and should be followed up as soon as possible.  Feel free to call the clinic you have any questions or concerns. The clinic phone number is [phone].

## 2014-07-10 ENCOUNTER — Other Ambulatory Visit: Payer: Self-pay | Admitting: *Deleted

## 2014-07-10 ENCOUNTER — Telehealth: Payer: Self-pay | Admitting: Oncology

## 2014-07-10 ENCOUNTER — Other Ambulatory Visit: Payer: Self-pay | Admitting: Nurse Practitioner

## 2014-07-10 ENCOUNTER — Other Ambulatory Visit (HOSPITAL_BASED_OUTPATIENT_CLINIC_OR_DEPARTMENT_OTHER): Payer: Medicare HMO

## 2014-07-10 ENCOUNTER — Ambulatory Visit (HOSPITAL_BASED_OUTPATIENT_CLINIC_OR_DEPARTMENT_OTHER): Payer: Medicare HMO

## 2014-07-10 DIAGNOSIS — C9000 Multiple myeloma not having achieved remission: Secondary | ICD-10-CM

## 2014-07-10 DIAGNOSIS — D469 Myelodysplastic syndrome, unspecified: Secondary | ICD-10-CM

## 2014-07-10 DIAGNOSIS — Z5111 Encounter for antineoplastic chemotherapy: Secondary | ICD-10-CM

## 2014-07-10 MED ORDER — ONDANSETRON HCL 8 MG PO TABS
8.0000 mg | ORAL_TABLET | Freq: Once | ORAL | Status: AC
  Administered 2014-07-10: 8 mg via ORAL

## 2014-07-10 MED ORDER — AZACITIDINE CHEMO SQ INJECTION
76.0000 mg/m2 | Freq: Once | INTRAMUSCULAR | Status: AC
  Administered 2014-07-10: 180 mg via SUBCUTANEOUS
  Filled 2014-07-10: qty 7.2

## 2014-07-10 MED ORDER — ONDANSETRON HCL 8 MG PO TABS
ORAL_TABLET | ORAL | Status: AC
  Filled 2014-07-10: qty 1

## 2014-07-10 NOTE — Patient Instructions (Signed)
[facility] Discharge Instructions for Patients Receiving Chemotherapy  Today you received the following chemotherapy agents; Vidaza.   To help prevent nausea and vomiting after your treatment, we encourage you to take your nausea medication as directed.    If you develop nausea and vomiting that is not controlled by your nausea medication, call the clinic.   BELOW ARE SYMPTOMS THAT SHOULD BE REPORTED IMMEDIATELY:  *FEVER GREATER THAN 100.5 F  *CHILLS WITH OR WITHOUT FEVER  NAUSEA AND VOMITING THAT IS NOT CONTROLLED WITH YOUR NAUSEA MEDICATION  *UNUSUAL SHORTNESS OF BREATH  *UNUSUAL BRUISING OR BLEEDING  TENDERNESS IN MOUTH AND THROAT WITH OR WITHOUT PRESENCE OF ULCERS  *URINARY PROBLEMS  *BOWEL PROBLEMS  UNUSUAL RASH Items with * indicate a potential emergency and should be followed up as soon as possible.  Feel free to call the clinic you have any questions or concerns. The clinic phone number is [phone].

## 2014-07-10 NOTE — Telephone Encounter (Signed)
Pt wife returned call after unable to lvm to confirm lab appt added for tx.

## 2014-07-11 ENCOUNTER — Telehealth: Payer: Self-pay | Admitting: *Deleted

## 2014-07-11 ENCOUNTER — Ambulatory Visit (HOSPITAL_BASED_OUTPATIENT_CLINIC_OR_DEPARTMENT_OTHER): Payer: Medicare HMO

## 2014-07-11 ENCOUNTER — Other Ambulatory Visit: Payer: Self-pay | Admitting: *Deleted

## 2014-07-11 DIAGNOSIS — Z5111 Encounter for antineoplastic chemotherapy: Secondary | ICD-10-CM

## 2014-07-11 DIAGNOSIS — C9000 Multiple myeloma not having achieved remission: Secondary | ICD-10-CM

## 2014-07-11 DIAGNOSIS — D469 Myelodysplastic syndrome, unspecified: Secondary | ICD-10-CM

## 2014-07-11 LAB — IRON AND TIBC CHCC
%SAT: 22 % (ref 20–55)
IRON: 42 ug/dL (ref 42–163)
TIBC: 189 ug/dL — AB (ref 202–409)
UIBC: 147 ug/dL (ref 117–376)

## 2014-07-11 LAB — FERRITIN CHCC: Ferritin: 332 ng/ml — ABNORMAL HIGH (ref 22–316)

## 2014-07-11 MED ORDER — ONDANSETRON HCL 8 MG PO TABS
ORAL_TABLET | ORAL | Status: AC
  Filled 2014-07-11: qty 1

## 2014-07-11 MED ORDER — ONDANSETRON HCL 8 MG PO TABS
8.0000 mg | ORAL_TABLET | Freq: Once | ORAL | Status: AC
  Administered 2014-07-11: 8 mg via ORAL

## 2014-07-11 MED ORDER — AZACITIDINE CHEMO SQ INJECTION
180.0000 mg | Freq: Once | INTRAMUSCULAR | Status: AC
  Administered 2014-07-11: 180 mg via SUBCUTANEOUS
  Filled 2014-07-11: qty 7.2

## 2014-07-11 NOTE — Telephone Encounter (Signed)
Per Dr. Benay Spice; spoke with pt and wife in treatment area confirming that he has stopped his Norvasc d/t BP low.  Pt verbalized understanding to hold Norvasc for now.

## 2014-07-11 NOTE — Patient Instructions (Signed)
[facility] Discharge Instructions for Patients Receiving Chemotherapy  Today you received the following chemotherapy agents Vidaza  To help prevent nausea and vomiting after your treatment, we encourage you to take your nausea medication as directed.   If you develop nausea and vomiting that is not controlled by your nausea medication, call the clinic.   BELOW ARE SYMPTOMS THAT SHOULD BE REPORTED IMMEDIATELY:  *FEVER GREATER THAN 100.5 F  *CHILLS WITH OR WITHOUT FEVER  NAUSEA AND VOMITING THAT IS NOT CONTROLLED WITH YOUR NAUSEA MEDICATION  *UNUSUAL SHORTNESS OF BREATH  *UNUSUAL BRUISING OR BLEEDING  TENDERNESS IN MOUTH AND THROAT WITH OR WITHOUT PRESENCE OF ULCERS  *URINARY PROBLEMS  *BOWEL PROBLEMS  UNUSUAL RASH Items with * indicate a potential emergency and should be followed up as soon as possible.  Feel free to call the clinic you have any questions or concerns. The clinic phone number is [phone].

## 2014-07-15 ENCOUNTER — Ambulatory Visit: Payer: Medicare HMO

## 2014-07-15 ENCOUNTER — Ambulatory Visit (HOSPITAL_COMMUNITY)
Admission: RE | Admit: 2014-07-15 | Discharge: 2014-07-15 | Disposition: A | Discharge status: Home / Self Care | Payer: Medicare HMO | Source: Ambulatory Visit | Attending: Nurse Practitioner | Admitting: Nurse Practitioner

## 2014-07-15 ENCOUNTER — Other Ambulatory Visit: Payer: Self-pay | Admitting: *Deleted

## 2014-07-15 ENCOUNTER — Ambulatory Visit: Payer: Self-pay

## 2014-07-15 ENCOUNTER — Other Ambulatory Visit (HOSPITAL_BASED_OUTPATIENT_CLINIC_OR_DEPARTMENT_OTHER): Payer: Medicare HMO

## 2014-07-15 VITALS — BP 105/56 | HR 84 | Temp 98.2°F | Resp 18

## 2014-07-15 DIAGNOSIS — D469 Myelodysplastic syndrome, unspecified: Secondary | ICD-10-CM

## 2014-07-15 DIAGNOSIS — D649 Anemia, unspecified: Secondary | ICD-10-CM

## 2014-07-15 DIAGNOSIS — C9000 Multiple myeloma not having achieved remission: Secondary | ICD-10-CM

## 2014-07-15 LAB — CBC WITH DIFFERENTIAL/PLATELET
BASO%: 0.4 % (ref 0.0–2.0)
Basophils Absolute: 0 10*3/uL (ref 0.0–0.1)
EOS ABS: 0.1 10*3/uL (ref 0.0–0.5)
EOS%: 3.3 % (ref 0.0–7.0)
HCT: 19.8 % — ABNORMAL LOW (ref 38.4–49.9)
HGB: 6.9 g/dL — CL (ref 13.0–17.1)
LYMPH#: 1.5 10*3/uL (ref 0.9–3.3)
LYMPH%: 63.1 % — AB (ref 14.0–49.0)
MCH: 32.1 pg (ref 27.2–33.4)
MCHC: 34.8 g/dL (ref 32.0–36.0)
MCV: 92.1 fL (ref 79.3–98.0)
MONO#: 0.1 10*3/uL (ref 0.1–0.9)
MONO%: 4.1 % (ref 0.0–14.0)
NEUT#: 0.7 10*3/uL — ABNORMAL LOW (ref 1.5–6.5)
NEUT%: 29.1 % — ABNORMAL LOW (ref 39.0–75.0)
NRBC: 1 % — AB (ref 0–0)
Platelets: 20 10*3/uL — ABNORMAL LOW (ref 140–400)
RBC: 2.15 10*6/uL — AB (ref 4.20–5.82)
RDW: 19.3 % — ABNORMAL HIGH (ref 11.0–14.6)
WBC: 2.4 10*3/uL — AB (ref 4.0–10.3)

## 2014-07-15 LAB — HOLD TUBE, BLOOD BANK

## 2014-07-15 LAB — TECHNOLOGIST REVIEW

## 2014-07-15 LAB — ERYTHROPOIETIN: ERYTHROPOIETIN: 675.9 m[IU]/mL — AB (ref 2.6–18.5)

## 2014-07-15 NOTE — Progress Notes (Signed)
Dr. Jana Hakim reviewed CBC results today.  HOLD Chemo today as per md.  Pt to receive 2u blood transfusion for Hgb  6.9.  Explanations given to pt and wife. Written instructions given to pt and wife re: pt to receive blood transfusion at [facility] at 12 pm on 07/16/14.

## 2014-07-15 NOTE — Progress Notes (Signed)
Called Sickle Cell Unit: Will transfuse tomorrow at 12:00 2 units blood/no premeds. Will need Lasix 20 mg IV between units.

## 2014-07-15 NOTE — Progress Notes (Signed)
Per Ned Card, NP : Patient needs a CBC tomorrow to ensure his platelets are not getting lower. Orders placed as sign & hold for sickle cell clinic to release tomorrow with IV start.

## 2014-07-16 ENCOUNTER — Other Ambulatory Visit: Payer: Self-pay | Admitting: *Deleted

## 2014-07-16 ENCOUNTER — Ambulatory Visit (HOSPITAL_COMMUNITY)
Admission: RE | Admit: 2014-07-16 | Discharge: 2014-07-16 | Disposition: A | Discharge status: Home / Self Care | Payer: Medicare HMO | Source: Ambulatory Visit | Attending: Oncology | Admitting: Oncology

## 2014-07-16 ENCOUNTER — Telehealth: Payer: Self-pay | Admitting: Nurse Practitioner

## 2014-07-16 VITALS — BP 111/58 | HR 85 | Temp 97.6°F | Resp 18

## 2014-07-16 DIAGNOSIS — C9000 Multiple myeloma not having achieved remission: Secondary | ICD-10-CM | POA: Diagnosis not present

## 2014-07-16 LAB — PREPARE RBC (CROSSMATCH)

## 2014-07-16 MED ORDER — SODIUM CHLORIDE 0.9 % IV SOLN
250.0000 mL | Freq: Once | INTRAVENOUS | Status: AC
  Administered 2014-07-16: 250 mL via INTRAVENOUS

## 2014-07-16 MED ORDER — FUROSEMIDE 10 MG/ML IJ SOLN
20.0000 mg | Freq: Once | INTRAMUSCULAR | Status: AC
  Administered 2014-07-16: 20 mg via INTRAVENOUS
  Filled 2014-07-16: qty 2

## 2014-07-16 NOTE — Procedures (Signed)
[facility]  Procedure Note  [name] SXJ:[id number] DOB: 1935-11-29 DOA: 07/16/2014   PCP: Redge Gainer, MD   Associated Diagnosis: Multiple myeloma   Procedure Note: Transfusion of 2 units PRBCs   Condition During Procedure:  Pt tolerated well   Condition at Discharge: Pt alert, oriented, accompanied by family member; no complications noted   Nigel Sloop, [facility]

## 2014-07-16 NOTE — Telephone Encounter (Signed)
per pof to sch pt appt-cld & spoke to [facility] and gave pt appt time & dtae-spouse understood

## 2014-07-16 NOTE — Progress Notes (Signed)
Unable to locate CBC result from today at [facility]. POF for CBC tomorrow.

## 2014-07-17 ENCOUNTER — Encounter (INDEPENDENT_AMBULATORY_CARE_PROVIDER_SITE_OTHER): Payer: Medicare HMO | Admitting: Internal Medicine

## 2014-07-17 ENCOUNTER — Other Ambulatory Visit (HOSPITAL_BASED_OUTPATIENT_CLINIC_OR_DEPARTMENT_OTHER): Payer: Medicare HMO

## 2014-07-17 DIAGNOSIS — D469 Myelodysplastic syndrome, unspecified: Secondary | ICD-10-CM

## 2014-07-17 DIAGNOSIS — C9000 Multiple myeloma not having achieved remission: Secondary | ICD-10-CM

## 2014-07-17 LAB — TYPE AND SCREEN
ABO/RH(D): B POS
ANTIBODY SCREEN: NEGATIVE
UNIT DIVISION: 0
Unit division: 0

## 2014-07-17 LAB — CBC WITH DIFFERENTIAL/PLATELET
BASO%: 0.6 % (ref 0.0–2.0)
Basophils Absolute: 0 10*3/uL (ref 0.0–0.1)
EOS%: 2 % (ref 0.0–7.0)
Eosinophils Absolute: 0.1 10*3/uL (ref 0.0–0.5)
HEMATOCRIT: 26.2 % — AB (ref 38.4–49.9)
HEMOGLOBIN: 9.1 g/dL — AB (ref 13.0–17.1)
LYMPH%: 60.1 % — AB (ref 14.0–49.0)
MCH: 30.7 pg (ref 27.2–33.4)
MCHC: 34.7 g/dL (ref 32.0–36.0)
MCV: 88.5 fL (ref 79.3–98.0)
MONO#: 0.2 10*3/uL (ref 0.1–0.9)
MONO%: 6.3 % (ref 0.0–14.0)
NEUT#: 1.1 10*3/uL — ABNORMAL LOW (ref 1.5–6.5)
NEUT%: 31 % — ABNORMAL LOW (ref 39.0–75.0)
PLATELETS: 26 10*3/uL — AB (ref 140–400)
RBC: 2.96 10*6/uL — AB (ref 4.20–5.82)
RDW: 19.9 % — ABNORMAL HIGH (ref 11.0–14.6)
WBC: 3.5 10*3/uL — AB (ref 4.0–10.3)
lymph#: 2.1 10*3/uL (ref 0.9–3.3)
nRBC: 0 % (ref 0–0)

## 2014-07-17 NOTE — Progress Notes (Signed)
Patient ID: [name], male    DOB: 1936/02/16, 79 y.o.   MRN: [id number]  HPI 03/05/11- 79 year old male former smoker seen because of obstructive sleep apnea on kind referral by Dr. Jacelyn Grip from [facility] in [place]. Wife(?) is here. A diagnostic sleep study on 01/31/2008 was done at [facility] because of insomnia with sleep apnea. This study confirmed moderately severe obstructive sleep apnea with an AHI of 22 per hour. A full face mask became uncomfortable, he got tired of it and stopped using CPAP several months to a year ago. Wife now reports loud snoring and again says he stays sleepy during the day although he has difficulty falling asleep. Bedtime between 8 and 9 PM with sleep latency at least to 30 minutes. He feels that he awakens and will lie awake or get up and wander around in the home  half of each night. He finally gets up around 5 AM. When he is asleep he feels that he is sleeping well but this is quite fragmented. He admits drowsiness during the day if he sits quietly. Occasional cough in the morning. Feels smothered lying supine, better on his sides. Treated for hypertension and elevated cholesterol but he denies heart or lung disease. Denies ENT surgery.  He had smoked heavily but quit 20 years ago.  04/23/11-  79 year old male former smoker seen because of obstructive sleep apnea. A male companion is with him. He has not been able to be compliant with CPAP, blaming pains in the back of his neck and spine related to wearing the CPAP. We discussed how it might be constraining motion until his muscles gets stiff. He wore it for one week out of the two-week auto titration trial. He has an older machine that worked well. He says his mask leak so that was unusable. His home care company would not replace it because he old money. He wants to skip the auto titration, go back to his old machine, change equipment supplier and get a new mask. We discussed CPAP, indications and alternatives,  medical concerns, and the documentation of adequate compliance to meet the Medicare rules. He isn't sure of the pressure setting on his old machine, which was one of the reasons we went for auto titration.  06/04/11- 79 year old male former smoker seen because of obstructive sleep apnea. Wife here. He got a replacement CPAP mask but is using his old machine. Advanced changed his pressure to 10. The current mask does not leak. He complains of insomnia with or without CPAP and that seems to be the biggest problem interfering with a sustained use of CPAP now.  07/30/11- 79 year old male former smoker seen because of obstructive sleep apnea. Wife here He try his old CPAP machine again but complains that humidifier gurgles in the flows even when he tries putting it lower than the level of his head so water runs back into the machine. He tried without she notify her but didn't like that either. Wife doesn't think CPAP stopped his snoring. He asks for a new sleep study which is the documentation we really need. He is sometimes restless at night even if he takes a sleeping pill.  09/02/19- 79 year old male former smoker seen because of obstructive sleep apnea.     NPSG 08/22/11- moderate OSA, AHI 26.8/hr with CPAP titration to 9 cwp for AHI 0.5/ hr. Oxygen was added at 2 L/M due to desaturation, and will need follow-up.  11/29/11- 79 year old male former smoker seen because of obstructive sleep apnea.  Pt hasnt worn mask fo 1 month. pt denies any sob,wheezing, chest congestion He was hospitalized twice for back surgery and I think complication of that surgery. Through that, he dropped off of CPAP. Wife says he still uses it a few hours at a time and it works when he wears it. Control has seemed  good at 9 CWP/ Advanced. Sleep is also disturbed by pain in his knees  02/29/12- 79 year old male former smoker seen because of obstructive sleep apnea.        Wife here  CPAP mask is not working well-unable to get one  until September due to insurance; Trying to wear CPAP every night  for 8-9 hours, but current mask hurts his face.  08/31/12- 79 year old male former smoker seen because of obstructive sleep apnea. FOLLOWS FOR: trying to wear CPAP every night but has hard time with mask around nose and pressure blowing into eyes; also states hard to lay/sleep on back. Leak worse in his preferred R decubitus sleep position. Wife says he snores through at times, but much better with CPAP. He is also concerned about dyspnea, especially with exertion. Dr Benay Spice recently suggested he might need to see a heart doctor. Denies chest pain, cough or wheeze. Apparently not a new problem.  03/05/13- 79 year old male former smoker seen because of obstructive sleep apnea. FOLLOWS FOR: hard to pull air in when putting machine on; then feels like too much air through the night; Wears CPAP 9/ Advanced every night. Would like refill of sleep medication as well.  Ambien has worked well with no problems. We discussed this again. CXR 3//3/14 IMPRESSION:  Negative chest.  Original Report Authenticated By: Orlean Patten, M.D.  07/13/13- 79 year old male former smoker seen because of obstructive sleep apnea. FOLLOWS FOR: pt had PFT done 06-2013 and Dr Bronson Ing wanted pt to be seen due to abnormal PFT results. CPAP Auto 7-12 Advanced.usually okay but at times he has trouble sleeping with it. Download indicates pressure of 10 would work. Now has an incidental cold. Routinely easy dyspnea on exertion without cough or wheeze. Ankles have been swelling. Says cardiac workup negative. ABG on room air 06/15/2013-pH 7.44, PCO2 35.7, PO2 75.7, bicarbonate 24.1. Slight respiratory alkalosis. PFT 06/15/2013-mild obstructive airways disease with insignificant response to bronchodilator, slight air trapping, diffusion severely reduced. FEC 2.88/71%, FEV1 2.28/76%, FEV1/FVC 0.79/105%, FEF 25-75% 1.63/66%. RV/TLC 128%, DLCO 47%. CXR  04/09/13 IMPRESSION:  Minimal bibasilar atelectasis or scarring.  Electronically Signed  By: Lorin Picket M.D.  On: 04/09/2013 16:49  09/03/13- 79 year old male former smoker seen because of obstructive sleep apnea. FOLLOWS FOR: Wearing CPAP 10/ Advanced for about 8-10 hours/night. Feels pressure is too much. Pt states CPAP is giving him a cold and headache. DME is [facility]. Using nasal pillows mask. He finds using this "challenging". Denies nocturnal choking or choking with meals. We discussed how to use the humidifier attachment. CT chest 08/07/13 IMPRESSION:  No pulmonary embolism.  Lower lobe peribronchial thickening and debris within right greater  than left lower lobe bronchi. May reflect bronchitis or aspiration  pneumonitis.  4 cm left hepatic lobe lesion is favored to reflect a hemangioma.  There may be a smaller right hepatic lobe hemangioma versus blush of  variant perfusion.  Electronically Signed  By: Carlos Levering M.D.  On: 08/07/2013 00:50  01/14/14- 79 year old male former smoker seen because of obstructive sleep apnea, complicated by multiple myeloma, anemia, allergic rhinitis, renal insufficiency, asthma with COPD FOLLOWS FOR: Pt states that CPAP 10/ Advanced seems to  be working okay at night. Pt reports mask leaking --into eyes every night.             Wife here We again discussed CPAP importance, comfort issues, alternatives and Some dyspnea on exertion and chest tightness without pain, wheeze, cough or palpitation.  07/17/13-79 year old male former smoker seen because of obstructive sleep apnea, complicated by multiple myeloma, anemia, allergic rhinitis, renal insufficiency, asthma with COPD FOLLOWS FOR: Wears CPAP nightly. Pt states that he is not sure that it is working well for him. Not sleeping well and having nasal burning, itchy watery eyes. Pt reports having bloody stools since 07/15/14. Pt states that he toilet bowl and tissue paper is bloody, dark red. Pt denies  constipation or pain. Pt states he has appt with GI 07/18/14 but they could not see him sooner.     Review of Systems-see HPI Constitutional:   No-   weight loss, night sweats, fevers, chills, +fatigue, lassitude HEENT:   No-  headaches, difficulty swallowing, tooth/dental problems, sore throat,       No-  sneezing, itching, ear ache, +nasal congestion, post nasal drip,  CV:  No-   chest pain, orthopnea, PND, swelling in lower extremities, anasarca, dizziness, palpitations Resp: + shortness of breath with exertion or at rest.              No-   productive cough,  No non-productive cough,  No-  coughing up of blood.              No-   change in color of mucus.  + wheezing.   Skin: No-   rash or lesions. GI:  No-   heartburn, indigestion, abdominal pain, nausea, vomiting,  GU: MS:  No-   joint pain or swelling.   Neuro- normal:  Psych:  No- change in mood or affect. No depression or anxiety.  No memory loss.  Objective:   Physical Exam General- Alert, Oriented, Affect-appropriate, Distress- none acute,  Overweight, calm/laconic but seems intelligent. Yawning Using a walker. Skin- rash-none, lesions- none, excoriation- none Lymphadenopathy- none Head- atraumatic            Eyes- Gross vision intact, PERRLA, conjunctivae clear secretions, +strabismus            Ears- Hearing, canals-normal            Nose- Clear, no-Septal dev, mucus, polyps, erosion, perforation             Throat- Mallampati II-III , mucosa clear , drainage- none, tonsils- atrophic Neck- flexible , trachea midline, no stridor , thyroid nl, carotid no bruit Chest - symmetrical excursion , unlabored           Heart/CV- RRR , no murmur , no gallop  , no rub, nl s1 s2                           +JVD 1 cm , edema+trace, stasis changes- none, varices- none           Lung-  clear, wheeze- none, cough- none , dullness-none, rub- none           Chest wall-  Abd- Br/ Gen/ Rectal- Not done, not indicated Extrem- cyanosis-  none, clubbing, none, atrophy- none, strength- nl Neuro- grossly intact to observation

## 2014-07-18 ENCOUNTER — Telehealth: Payer: Self-pay | Admitting: *Deleted

## 2014-07-18 ENCOUNTER — Ambulatory Visit (INDEPENDENT_AMBULATORY_CARE_PROVIDER_SITE_OTHER): Payer: Medicare HMO | Admitting: Internal Medicine

## 2014-07-18 ENCOUNTER — Encounter: Payer: Self-pay | Admitting: Internal Medicine

## 2014-07-18 VITALS — BP 100/50 | HR 88 | Ht 69.75 in | Wt 245.2 lb

## 2014-07-18 DIAGNOSIS — K648 Other hemorrhoids: Secondary | ICD-10-CM

## 2014-07-18 DIAGNOSIS — K644 Residual hemorrhoidal skin tags: Secondary | ICD-10-CM

## 2014-07-18 NOTE — Assessment & Plan Note (Addendum)
LL pile ligated today After this I realized he has thrombocytopenia He may bleed more than usual from the banding, explained to patient and wife - if he does would give PLT's Adjust laxatives to avoid loose stools.

## 2014-07-18 NOTE — Patient Instructions (Signed)
Purchase recticare which is over the counter to use as needed.  Adjust the miralax to avoid diarrhea.  Follow up with Dr. Carlean Purl as needed.  I appreciate the opportunity to care for you. Silvano Rusk, M.D., [facility]

## 2014-07-18 NOTE — Telephone Encounter (Signed)
Call from pt's wife stating she missed a call from this office. Likely appt reminder. Appts confirmed. Noted pt had hemorrhoid procedure done today, instructed wife to call office for any bleeding that does not stop right away. She voiced understanding.

## 2014-07-18 NOTE — Assessment & Plan Note (Signed)
Recticare prn

## 2014-07-18 NOTE — Progress Notes (Signed)
   Subjective:    Patient ID: [name], male    DOB: [birth year], 79 y.o.   MRN: [id number]  HPI Here with wife c/o rectal; bleeding again from hemorrhoids. Stools are softer now so she is holding MiraLax and fiber. Some anal soreness.  Had seen about 8 months ago - was bleeding some then after banding but was overall better so we tried conservative Tx. He says bleeding worse now.    Review of Systems As above    Objective:   Physical Exam Elderly bm, chronically ill abd obese  Rectal exam shows fleshy tags LL worst which is tender but soft. No mass on rectal exam.  Anoscopy shows Grade 2 internal hemorrhoids all positions  PROCEDURE NOTE: The patient presents with symptomatic grade 2 hemorrhoids.  All risks, benefits and alternative forms of therapy were described and informed consent was obtained.   The anorectum was pre-medicated with 5% lidocaine and 0.125% NTG onintment The decision was made to band the LL internal hemorrhoid, and the [place] was used to perform band ligation without complication.  Digital anorectal examination was then performed to assure proper positioning of the band, and to adjust the banded tissue as required.  The patient was discharged home without pain or other issues.  Dietary and behavioral recommendations were given and along with follow-up instructions.     The following adjunctive treatments were recommended:  Reduce MiraLax and fiber to avoid diarrhea   After banding done I discovered that he has thrombocytopenia from his myelodysplasia. This is likely why he is bleeding more. I explained that he may continue to bleed despite the banding and bleed from that also due to low PLT's.  This band was placed to see if it would alleviate the LL tag and hemorrhoid sxs.   Hopefully he will not have excessive bleeding from the banding but if he does would likely benefit from PLT transfusion before any other intervention.   The patient will  return as needed for  follow-up and possible additional banding as required. No complications were encountered and the patient tolerated the procedure well.     Assessment & Plan:

## 2014-07-19 ENCOUNTER — Ambulatory Visit (INDEPENDENT_AMBULATORY_CARE_PROVIDER_SITE_OTHER): Payer: Medicare HMO | Admitting: Internal Medicine

## 2014-07-19 ENCOUNTER — Encounter: Payer: Self-pay | Admitting: Internal Medicine

## 2014-07-19 ENCOUNTER — Ambulatory Visit (INDEPENDENT_AMBULATORY_CARE_PROVIDER_SITE_OTHER)
Admission: RE | Admit: 2014-07-19 | Discharge: 2014-07-19 | Disposition: A | Discharge status: Home / Self Care | Payer: Medicare HMO | Source: Ambulatory Visit | Attending: Internal Medicine | Admitting: Internal Medicine

## 2014-07-19 VITALS — BP 110/58 | HR 84 | Ht 72.0 in | Wt 246.0 lb

## 2014-07-19 DIAGNOSIS — D61810 Antineoplastic chemotherapy induced pancytopenia: Secondary | ICD-10-CM

## 2014-07-19 DIAGNOSIS — R0609 Other forms of dyspnea: Secondary | ICD-10-CM

## 2014-07-19 DIAGNOSIS — G4733 Obstructive sleep apnea (adult) (pediatric): Secondary | ICD-10-CM

## 2014-07-19 DIAGNOSIS — J449 Chronic obstructive pulmonary disease, unspecified: Secondary | ICD-10-CM

## 2014-07-19 NOTE — Assessment & Plan Note (Signed)
Dyspnea is magnified by anemia, diastolic heart failure and deconditioning Plan-chest x-ray

## 2014-07-19 NOTE — Assessment & Plan Note (Signed)
Current problems don't seem to be related to pulmonary insufficiency and he is not wheezing.

## 2014-07-19 NOTE — Progress Notes (Signed)
Patient ID: [name], male    DOB: 1936/02/16, 79 y.o.   MRN: [id number]  HPI 03/05/11- 79 year old male former smoker seen because of obstructive sleep apnea on kind referral by Dr. Jacelyn Grip from [facility] in [place]. Wife(?) is here. A diagnostic sleep study on 01/31/2008 was done at [facility] because of insomnia with sleep apnea. This study confirmed moderately severe obstructive sleep apnea with an AHI of 22 per hour. A full face mask became uncomfortable, he got tired of it and stopped using CPAP several months to a year ago. Wife now reports loud snoring and again says he stays sleepy during the day although he has difficulty falling asleep. Bedtime between 8 and 9 PM with sleep latency at least to 30 minutes. He feels that he awakens and will lie awake or get up and wander around in the home  half of each night. He finally gets up around 5 AM. When he is asleep he feels that he is sleeping well but this is quite fragmented. He admits drowsiness during the day if he sits quietly. Occasional cough in the morning. Feels smothered lying supine, better on his sides. Treated for hypertension and elevated cholesterol but he denies heart or lung disease. Denies ENT surgery.  He had smoked heavily but quit 20 years ago.  04/23/11-  79 year old male former smoker seen because of obstructive sleep apnea. A male companion is with him. He has not been able to be compliant with CPAP, blaming pains in the back of his neck and spine related to wearing the CPAP. We discussed how it might be constraining motion until his muscles gets stiff. He wore it for one week out of the two-week auto titration trial. He has an older machine that worked well. He says his mask leak so that was unusable. His home care company would not replace it because he old money. He wants to skip the auto titration, go back to his old machine, change equipment supplier and get a new mask. We discussed CPAP, indications and alternatives,  medical concerns, and the documentation of adequate compliance to meet the Medicare rules. He isn't sure of the pressure setting on his old machine, which was one of the reasons we went for auto titration.  06/04/11- 79 year old male former smoker seen because of obstructive sleep apnea. Wife here. He got a replacement CPAP mask but is using his old machine. Advanced changed his pressure to 10. The current mask does not leak. He complains of insomnia with or without CPAP and that seems to be the biggest problem interfering with a sustained use of CPAP now.  07/30/11- 79 year old male former smoker seen because of obstructive sleep apnea. Wife here He try his old CPAP machine again but complains that humidifier gurgles in the flows even when he tries putting it lower than the level of his head so water runs back into the machine. He tried without she notify her but didn't like that either. Wife doesn't think CPAP stopped his snoring. He asks for a new sleep study which is the documentation we really need. He is sometimes restless at night even if he takes a sleeping pill.  09/02/19- 79 year old male former smoker seen because of obstructive sleep apnea.     NPSG 08/22/11- moderate OSA, AHI 26.8/hr with CPAP titration to 9 cwp for AHI 0.5/ hr. Oxygen was added at 2 L/M due to desaturation, and will need follow-up.  11/29/11- 79 year old male former smoker seen because of obstructive sleep apnea.  Pt hasnt worn mask fo 1 month. pt denies any sob,wheezing, chest congestion He was hospitalized twice for back surgery and I think complication of that surgery. Through that, he dropped off of CPAP. Wife says he still uses it a few hours at a time and it works when he wears it. Control has seemed  good at 9 CWP/ [facility]. Sleep is also disturbed by pain in his knees  02/29/12- 79 year old male former smoker seen because of obstructive sleep apnea.        Wife here  CPAP mask is not working well-unable to get one  until September due to insurance; Trying to wear CPAP every night  for 8-9 hours, but current mask hurts his face.  08/31/12- 79 year old male former smoker seen because of obstructive sleep apnea. FOLLOWS FOR: trying to wear CPAP every night but has hard time with mask around nose and pressure blowing into eyes; also states hard to lay/sleep on back. Leak worse in his preferred R decubitus sleep position. Wife says he snores through at times, but much better with CPAP. He is also concerned about dyspnea, especially with exertion. Dr Benay Spice recently suggested he might need to see a heart doctor. Denies chest pain, cough or wheeze. Apparently not a new problem.  03/05/13- 79 year old male former smoker seen because of obstructive sleep apnea. FOLLOWS FOR: hard to pull air in when putting machine on; then feels like too much air through the night; Wears CPAP 9/ [facility] every night. Would like refill of sleep medication as well.  Ambien has worked well with no problems. We discussed this again. CXR 3//3/14 IMPRESSION:  Negative chest.  Original Report Authenticated By: Orlean Patten, M.D.  07/13/13- 79 year old male former smoker seen because of obstructive sleep apnea. FOLLOWS FOR: pt had PFT done 06-2013 and Dr Bronson Ing wanted pt to be seen due to abnormal PFT results. CPAP Auto 7-12 [facility].usually okay but at times he has trouble sleeping with it. Download indicates pressure of 10 would work. Now has an incidental cold. Routinely easy dyspnea on exertion without cough or wheeze. Ankles have been swelling. Says cardiac workup negative. ABG on room air 06/15/2013-pH 7.44, PCO2 35.7, PO2 75.7, bicarbonate 24.1. Slight respiratory alkalosis. PFT 06/15/2013-mild obstructive airways disease with insignificant response to bronchodilator, slight air trapping, diffusion severely reduced. FEC 2.88/71%, FEV1 2.28/76%, FEV1/FVC 0.79/105%, FEF 25-75% 1.63/66%. RV/TLC 128%, DLCO 47%. CXR  04/09/13 IMPRESSION:  Minimal bibasilar atelectasis or scarring.  Electronically Signed  By: Lorin Picket M.D.  On: 04/09/2013 16:49  09/03/13- 79 year old male former smoker seen because of obstructive sleep apnea. FOLLOWS FOR: Wearing CPAP 10/ [facility] for about 8-10 hours/night. Feels pressure is too much. Pt states CPAP is giving him a cold and headache. DME is [facility]. Using nasal pillows mask. He finds using this "challenging". Denies nocturnal choking or choking with meals. We discussed how to use the humidifier attachment. CT chest 08/07/13 IMPRESSION:  No pulmonary embolism.  Lower lobe peribronchial thickening and debris within right greater  than left lower lobe bronchi. May reflect bronchitis or aspiration  pneumonitis.  4 cm left hepatic lobe lesion is favored to reflect a hemangioma.  There may be a smaller right hepatic lobe hemangioma versus blush of  variant perfusion.  Electronically Signed  By: Carlos Levering M.D.  On: 08/07/2013 00:50  01/14/14- 79 year old male former smoker seen because of obstructive sleep apnea, complicated by multiple myeloma, anemia, allergic rhinitis, renal insufficiency, asthma with COPD FOLLOWS FOR: Pt states that CPAP 10/ Advanced seems to  be working okay at night. Pt reports mask leaking --into eyes every night.             Wife here We again discussed CPAP importance, comfort issues, alternatives and Some dyspnea on exertion and chest tightness without pain, wheeze, cough or palpitation.  07/19/14- 79 year old male former smoker seen because of obstructive sleep apnea, complicated by multiple myeloma, anemia, allergic rhinitis, renal insufficiency, asthma with COPD   Wife here FOLLOWS FOR: Wears CPAP Auto/ [facility]/ O2 2L nightly. Pt states that he is not sure that it is working well for him. Not sleeping well and having nasal burning, itchy watery eyes. No download available-last date of registered data is 03/2013.  Says he sleeps okay at night  but feels "tired/trance". Not taking clonazepam. Easy shortness of breath with any exertion. Saw GI for recent GI bleed/hemorrhoids now better controlled. Pending appointment with oncology for multiple myeloma and anemia with recent transfusion. Denies cough, wheeze, phlegm, chest pain or palpitation.  Review of Systems-see HPI Constitutional:   No-   weight loss, night sweats, fevers, chills, +fatigue, lassitude HEENT:   No-  headaches, difficulty swallowing, tooth/dental problems, sore throat,       No-  sneezing, itching, ear ache, +nasal congestion, post nasal drip,  CV:  No-   chest pain, orthopnea, PND, swelling in lower extremities, anasarca, dizziness, palpitations Resp: + shortness of breath with exertion or at rest.              No-   productive cough,  No non-productive cough,  No-  coughing up of blood.              No-   change in color of mucus.  + wheezing.   Skin: No-   rash or lesions. GI:  No-   heartburn, indigestion, abdominal pain, nausea, vomiting,  GU: MS:  No-   joint pain or swelling.   Neuro- normal:  Psych:  No- change in mood or affect. No depression or anxiety.  No memory loss.  Objective:   Physical Exam General- Alert, Oriented, Affect-appropriate, Distress- none acute,  Overweight,  seems weak/tired, sitting in wheelchair, room air  93% Skin- rash-none, lesions- none, excoriation- none Lymphadenopathy- none Head- atraumatic            Eyes- Gross vision intact, PERRLA, conjunctivae clear secretions, +strabismus            Ears- Hearing, canals-normal            Nose- Clear, no-Septal dev, mucus, polyps, erosion, perforation             Throat- Mallampati II-III , mucosa clear , drainage- none, tonsils- atrophic Neck- flexible , trachea midline, no stridor , thyroid nl, carotid no bruit Chest - symmetrical excursion , unlabored           Heart/CV- RRR , no murmur , no gallop  , no rub, nl s1 s2                           +JVD 1 cm , edema+trace, stasis  changes- none, varices- none           Lung-  + few crackles right base, unlabored, wheeze- none, cough- none , dullness-none, rub- none           Chest wall-  Abd- Br/ Gen/ Rectal- Not done, not indicated Extrem- cyanosis- none, clubbing, none, atrophy- none, strength- nl Neuro- grossly intact  to observation

## 2014-07-19 NOTE — Assessment & Plan Note (Addendum)
Encouraged to continue with oxygen and CPAP for sleep. Plan Advanced is to check his CPAP machine to see why we can't get her current download since he insists he has been wearing it.

## 2014-07-19 NOTE — Assessment & Plan Note (Signed)
Recent anemia magnified by bleeding hemorrhoid GI loss which is now under control. He did need transfusion. Anemia will magnify [place] of his diastolic congestive heart failure and COPD but he also seems weak, tired and chronically ill Plan keep scheduled follow-up with oncology

## 2014-07-19 NOTE — Patient Instructions (Signed)
Keep your appointments with GI and Oncology  Order- DME [facility] check CPAP- We can't get download after Sept, 2014 but he has been wearing it.   Order- CXR  Dx dyspnea on exertion

## 2014-07-22 ENCOUNTER — Telehealth: Payer: Self-pay | Admitting: Nurse Practitioner

## 2014-07-22 ENCOUNTER — Ambulatory Visit (HOSPITAL_BASED_OUTPATIENT_CLINIC_OR_DEPARTMENT_OTHER): Payer: Medicare HMO | Admitting: Nurse Practitioner

## 2014-07-22 ENCOUNTER — Encounter: Payer: Self-pay | Admitting: Nurse Practitioner

## 2014-07-22 ENCOUNTER — Other Ambulatory Visit (HOSPITAL_BASED_OUTPATIENT_CLINIC_OR_DEPARTMENT_OTHER): Payer: Medicare HMO

## 2014-07-22 VITALS — BP 94/45 | HR 85 | Temp 98.0°F | Resp 18 | Ht 72.0 in

## 2014-07-22 DIAGNOSIS — C9000 Multiple myeloma not having achieved remission: Secondary | ICD-10-CM

## 2014-07-22 DIAGNOSIS — D469 Myelodysplastic syndrome, unspecified: Secondary | ICD-10-CM

## 2014-07-22 DIAGNOSIS — D61818 Other pancytopenia: Secondary | ICD-10-CM

## 2014-07-22 LAB — CBC WITH DIFFERENTIAL/PLATELET
BASO%: 0 % (ref 0.0–2.0)
Basophils Absolute: 0 10*3/uL (ref 0.0–0.1)
EOS%: 1.1 % (ref 0.0–7.0)
Eosinophils Absolute: 0 10*3/uL (ref 0.0–0.5)
HCT: 23.1 % — ABNORMAL LOW (ref 38.4–49.9)
HEMOGLOBIN: 8 g/dL — AB (ref 13.0–17.1)
LYMPH#: 1.7 10*3/uL (ref 0.9–3.3)
LYMPH%: 65.2 % — AB (ref 14.0–49.0)
MCH: 31.4 pg (ref 27.2–33.4)
MCHC: 34.6 g/dL (ref 32.0–36.0)
MCV: 90.6 fL (ref 79.3–98.0)
MONO#: 0.1 10*3/uL (ref 0.1–0.9)
MONO%: 2.7 % (ref 0.0–14.0)
NEUT%: 31 % — AB (ref 39.0–75.0)
NEUTROS ABS: 0.8 10*3/uL — AB (ref 1.5–6.5)
Platelets: 19 10*3/uL — ABNORMAL LOW (ref 140–400)
RBC: 2.55 10*6/uL — ABNORMAL LOW (ref 4.20–5.82)
RDW: 19.2 % — AB (ref 11.0–14.6)
WBC: 2.6 10*3/uL — ABNORMAL LOW (ref 4.0–10.3)
nRBC: 0 % (ref 0–0)

## 2014-07-22 LAB — TECHNOLOGIST REVIEW

## 2014-07-22 NOTE — Progress Notes (Signed)
[facility] OFFICE PROGRESS NOTE   Diagnosis:  MDS, multiple myeloma  INTERVAL HISTORY:   [name] returns as scheduled. He completed cycle 1 5-azacytidine beginning 07/08/2014. Day 5 was held. He was transfused 2 units of blood on 07/16/2014 for a hemoglobin of 6.9. Follow-up hemoglobin on 07/17/2014 was 9.1. He reports tolerating the injections well. No nausea or vomiting. He has stable mild constipation. He continues to have intermittent lightheadedness/dizziness. He denies bleeding. No fever. He has occasional "light" chills. He has stable dyspnea on exertion.  Objective:  Vital signs in last 24 hours:  Blood pressure 96/51, pulse 82, temperature 98 F (36.7 C), temperature source Oral, resp. rate 18, height 6' (1.829 m), weight 0 lb (0 kg), SpO2 97 %.    HEENT: No thrush or ulcers. No bleeding within the visualized oral cavity. Resp: Lungs clear bilaterally. Cardio: Regular rate and rhythm. GI: Abdomen soft and nontender. No organomegaly. Vascular: Trace lower leg edema bilaterally. Neuro: Alert and oriented.     Lab Results:  Lab Results  Component Value Date   WBC 2.6* 07/22/2014   HGB 8.0* 07/22/2014   HCT 23.1* 07/22/2014   MCV 90.6 07/22/2014   PLT 19* 07/22/2014   NEUTROABS 0.8* 07/22/2014    Imaging:  No results found.  Medications: I have reviewed the patient's current medications.  Assessment/Plan: 1. Multiple myeloma, status post treatment with melphalan/prednisone/thalidomide beginning in November 2009. The thalidomide was increased to 200 mg daily beginning 09/11/2008. The serum M spike was slightly improved on 03/18/2009 and slightly increased on 05/09/2009. He has been maintained off of specific therapy for multiple myeloma since September 2010. The serum M spike was increased on 10/20/2011 at 4.6. He began Revlimid on 11/11/2011 with weekly dexamethasone. He began cycle 2 of Revlimid on 12/09/2011. The serum M spike was improved on  12/31/2011. The serum M spike was slightly higher on 01/28/2012. He began cycle 4 on 02/03/2012. He developed recurrent hypercalcemia. Treatment was changed to Cytoxan/Velcade/Decadron beginning 03/02/2012. Cytoxan discontinued in January 2015  Treatment continued with Velcade/Decadron.   Stable serum M spike and IgG 10/04/2013.   Stable serum M spike 12/06/2013   Treatment placed on hold 01/10/2014 secondary to thrombocytopenia and a slightly higher M spike   Serum M spike and IgG increased 03/07/2014.  Cycle 1 Pomalidomide/dexamethasone 04/01/2014.  Pomalidomide subsequently placed on hold due to cytopenias.  Pomalidomide resumed at a reduced dose 05/23/2014.  Pomalidomide placed on hold 06/04/2014 due to progressive pancytopenia. 2. Chronic "dizziness."  3. Chest x-ray 11/01/2008 with a patchy opacity at the right midlung suspicious for pneumonia. He was treated with Avelox. 4. Low back pain with a radicular component. An MRI on 10/07/2008 showed no involvement of the lumbosacral spine with myeloma. 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 5. Bilateral neck pain, status post a CT 05/17/2008 with findings of spinal stenosis and osteoarthritis. 6. 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 Red cell microcytosis, likely related to hemoglobin C trait. A ferritin level returned elevated and a stool Hemoccult was negative 01/23/2010. He underwent a colonoscopy in 2009. 8. Elevated beta-2 microglobulin level. 9. History of hypertension. Blood pressure medication placed on hold December 2015 due to hypotension. 10. History of a herniated disk at L4-L5 on MRI an 01/11/2002. 11. Hyperlipidemia. 12. Gastroesophageal reflux disease. 13. History of atypical angina. 14. History of rectal bleeding-large external hemorrhoids noted  02/25/2012. The stool was Hemoccult positive. He has a history of colon polyps. He is followed by [facility]. He was diagnosed with hemorrhoids in September of 2013 while hospitalized. He underwent a band procedure 03/09/2013. He has had no further rectal bleeding. 15. Superficial venous thrombosis of the greater saphenous vein on a Doppler 09/27/2008. Negative for deep vein thrombosis. Symptoms improved with aspirin. 16. Neck pain with numbness/tingling in the arms, hands, and low back with proximal right leg weakness. An MRI of the cervical spine 05/30/2009 showed multilevel spondylosis with mild cord edema at C4-C5 and C5-C6. He was noted to have an enhancing lesion of the cord at T3-T4 of unclear etiology. In the lumbar spine there was no change in the spondylosis at L3-L4 and L4-L5. There was no involvement of the cervical or lumbar spine with myeloma. He is status post C4-C5, C5-C6, and C6-C7 anterior cervical diskectomy with fusion by Dr. Annette Stable 08/01/2009. 17. Thrombocytopenia-progressive, likely secondary to progression of multiple myeloma 18. Indeterminate-age deep vein thrombosis of the left lower extremity on a venous Doppler 08/15/2009. There were also findings consistent with superficial thrombosis involving the right lower extremity 08/15/2009. Coumadin was discontinued in October 2011. 19. Type 2 diabetes. Now on an oral hypoglycemic 20. History of hematuria, followed at [facility]. Urinalysis was negative for blood on 07/23/2011. 21. Radicular back pain with MRI of the lumbar spine on 10/19/2011 showing nerve root impingement at L2-L3, L3-L4 and L4-L5 with the most severe at the right L3 nerve roots due to a large disc fragment. Associated right leg weakness. He underwent right L2-3 decompressive laminotomy with right L2 and L3 decompressive foraminotomy; right L2-3 microdiscectomy on 11/01/2011. 22. Constipation. He continues a laxative regimen. 23. Hospitalization 11/08/2011 through  11/10/2011 with orthostatic hypotension/dizziness. He improved with intravenous hydration. He had mild hypotension when here on 11/18/2011. We recommended discontinuing Norvasc and Proscar. 24. Hypercalcemia status post pamidronate 11/03/2011. Recurrent hypercalcemia 02/16/2012 status post Zometa. The calcium has remained in normal range. 25. Fractured tooth with associated pain. Status post a tooth extraction by Dr. Enrique Sack, Zometa was placed on hold. 26. Exertional dyspnea. Stable. He was referred to cardiology, pulmonary function studies 06/15/2013 showed a moderately severe diffusion defect. He utilizes supplemental oxygen as needed. He is followed by pulmonary. 27. 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 28. 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   Status post cycle 1 5-azacytidine beginning 07/08/2014 4 days (day 5 held). 29. Hypotension. Blood pressure medication placed on hold December 2015.   Disposition: [name] appears unchanged. He is status post 1 cycle of 5-azacytidine. He remains pancytopenic. He will return for a CBC on 07/29/2013 and will likely need a red cell transfusion. We will go ahead and make those arrangements. We scheduled a follow-up visit and cycle 2 5-azacytidine beginning 08/05/2014. He understands to contact the office in the interim with any problems. We specifically discussed fever, chills, bleeding.  Plan reviewed with Dr. Benay Spice.  Ned Card ANP/GNP-BC   07/22/2014  2:46 PM

## 2014-07-22 NOTE — Telephone Encounter (Signed)
Gave avs & cal for Jan. Sent mess to sch tx. °

## 2014-07-23 ENCOUNTER — Other Ambulatory Visit: Payer: Self-pay | Admitting: Internal Medicine

## 2014-07-23 DIAGNOSIS — J441 Chronic obstructive pulmonary disease with (acute) exacerbation: Secondary | ICD-10-CM

## 2014-07-25 ENCOUNTER — Encounter (HOSPITAL_COMMUNITY): Payer: Self-pay | Admitting: Gastroenterology

## 2014-07-28 ENCOUNTER — Other Ambulatory Visit: Payer: Self-pay | Admitting: Family Medicine

## 2014-07-29 ENCOUNTER — Other Ambulatory Visit (HOSPITAL_BASED_OUTPATIENT_CLINIC_OR_DEPARTMENT_OTHER): Payer: Medicare HMO

## 2014-07-29 ENCOUNTER — Telehealth: Payer: Self-pay | Admitting: *Deleted

## 2014-07-29 ENCOUNTER — Other Ambulatory Visit: Payer: Self-pay | Admitting: *Deleted

## 2014-07-29 ENCOUNTER — Telehealth: Payer: Self-pay

## 2014-07-29 ENCOUNTER — Telehealth: Payer: Self-pay | Admitting: Oncology

## 2014-07-29 DIAGNOSIS — D649 Anemia, unspecified: Secondary | ICD-10-CM

## 2014-07-29 DIAGNOSIS — C9000 Multiple myeloma not having achieved remission: Secondary | ICD-10-CM

## 2014-07-29 DIAGNOSIS — D469 Myelodysplastic syndrome, unspecified: Secondary | ICD-10-CM

## 2014-07-29 LAB — CBC WITH DIFFERENTIAL/PLATELET
BASO%: 0 % (ref 0.0–2.0)
Basophils Absolute: 0 10*3/uL (ref 0.0–0.1)
EOS ABS: 0.1 10*3/uL (ref 0.0–0.5)
EOS%: 2.5 % (ref 0.0–7.0)
HEMATOCRIT: 24 % — AB (ref 38.4–49.9)
HGB: 8.4 g/dL — ABNORMAL LOW (ref 13.0–17.1)
LYMPH%: 67.5 % — AB (ref 14.0–49.0)
MCH: 31.9 pg (ref 27.2–33.4)
MCHC: 35 g/dL (ref 32.0–36.0)
MCV: 91.3 fL (ref 79.3–98.0)
MONO#: 0.1 10*3/uL (ref 0.1–0.9)
MONO%: 3 % (ref 0.0–14.0)
NEUT#: 0.5 10*3/uL — CL (ref 1.5–6.5)
NEUT%: 27 % — AB (ref 39.0–75.0)
Platelets: 12 10*3/uL — ABNORMAL LOW (ref 140–400)
RBC: 2.63 10*6/uL — AB (ref 4.20–5.82)
RDW: 19.6 % — ABNORMAL HIGH (ref 11.0–14.6)
WBC: 2 10*3/uL — ABNORMAL LOW (ref 4.0–10.3)
lymph#: 1.4 10*3/uL (ref 0.9–3.3)
nRBC: 0 % (ref 0–0)

## 2014-07-29 LAB — PREPARE RBC (CROSSMATCH)

## 2014-07-29 LAB — TECHNOLOGIST REVIEW

## 2014-07-29 NOTE — Telephone Encounter (Signed)
s.w. pt wife and advised on Jan appt....ok and aware °

## 2014-07-29 NOTE — Telephone Encounter (Signed)
Wife asking what time [name] needs to come in tomorrow for his blood. Instructed her to bring him at 0945 for his lab-RN can obtain CBC when she starts his IV. He still has his bloodbank bracelet on and knows to keep it on for tomorrow. Need to ensure his platelet count has not decreased more. Saw his was in flush schedule for tomorrow-cancelled this since he has no PAC.

## 2014-07-29 NOTE — Telephone Encounter (Signed)
Called to check on pt, HGB is 8.4 today. Pt was put on schedule for blood product tomorrow, pt states he does not feel well and will be in tomorrow at 1045 for blood. Dr. Glori Bickers would like platelets rechecked tomorrow 07/30/14 as well. POF sent to scheduling.

## 2014-07-30 ENCOUNTER — Other Ambulatory Visit: Payer: Self-pay | Admitting: *Deleted

## 2014-07-30 ENCOUNTER — Ambulatory Visit (HOSPITAL_BASED_OUTPATIENT_CLINIC_OR_DEPARTMENT_OTHER): Payer: Medicare HMO

## 2014-07-30 ENCOUNTER — Other Ambulatory Visit (HOSPITAL_BASED_OUTPATIENT_CLINIC_OR_DEPARTMENT_OTHER): Payer: Medicare HMO

## 2014-07-30 VITALS — BP 113/56 | HR 82 | Temp 98.0°F | Resp 20

## 2014-07-30 DIAGNOSIS — D649 Anemia, unspecified: Secondary | ICD-10-CM

## 2014-07-30 DIAGNOSIS — D6481 Anemia due to antineoplastic chemotherapy: Secondary | ICD-10-CM

## 2014-07-30 DIAGNOSIS — C9000 Multiple myeloma not having achieved remission: Secondary | ICD-10-CM | POA: Diagnosis not present

## 2014-07-30 DIAGNOSIS — D630 Anemia in neoplastic disease: Secondary | ICD-10-CM

## 2014-07-30 LAB — CBC WITH DIFFERENTIAL/PLATELET
BASO%: 1.1 % (ref 0.0–2.0)
Basophils Absolute: 0 10*3/uL (ref 0.0–0.1)
EOS%: 4 % (ref 0.0–7.0)
Eosinophils Absolute: 0.1 10*3/uL (ref 0.0–0.5)
HCT: 24.6 % — ABNORMAL LOW (ref 38.4–49.9)
HEMOGLOBIN: 8.6 g/dL — AB (ref 13.0–17.1)
LYMPH#: 1.2 10*3/uL (ref 0.9–3.3)
LYMPH%: 65.9 % — AB (ref 14.0–49.0)
MCH: 31.7 pg (ref 27.2–33.4)
MCHC: 35 g/dL (ref 32.0–36.0)
MCV: 90.8 fL (ref 79.3–98.0)
MONO#: 0.1 10*3/uL (ref 0.1–0.9)
MONO%: 2.8 % (ref 0.0–14.0)
NEUT#: 0.5 10*3/uL — CL (ref 1.5–6.5)
NEUT%: 26.2 % — AB (ref 39.0–75.0)
PLATELETS: 12 10*3/uL — AB (ref 140–400)
RBC: 2.71 10*6/uL — AB (ref 4.20–5.82)
RDW: 19.8 % — ABNORMAL HIGH (ref 11.0–14.6)
WBC: 1.8 10*3/uL — ABNORMAL LOW (ref 4.0–10.3)
nRBC: 1 % — ABNORMAL HIGH (ref 0–0)

## 2014-07-30 LAB — HOLD TUBE, BLOOD BANK

## 2014-07-30 MED ORDER — ACYCLOVIR 400 MG PO TABS: ORAL_TABLET | ORAL | Status: DC

## 2014-07-30 MED ORDER — SODIUM CHLORIDE 0.9 % IV SOLN
250.0000 mL | Freq: Once | INTRAVENOUS | Status: AC
  Administered 2014-07-30: 250 mL via INTRAVENOUS

## 2014-07-30 MED ORDER — FUROSEMIDE 10 MG/ML IJ SOLN
20.0000 mg | Freq: Once | INTRAMUSCULAR | Status: AC
  Administered 2014-07-30: 20 mg via INTRAVENOUS

## 2014-07-30 MED ORDER — FUROSEMIDE 10 MG/ML IJ SOLN
INTRAMUSCULAR | Status: AC
  Filled 2014-07-30: qty 2

## 2014-07-30 NOTE — Patient Instructions (Signed)

## 2014-07-30 NOTE — Telephone Encounter (Signed)
Wife requesting transfer of acyclovir script to [facility] in [place] and needs refill.

## 2014-07-31 LAB — TYPE AND SCREEN
ABO/RH(D): B POS
ANTIBODY SCREEN: NEGATIVE
UNIT DIVISION: 0
UNIT DIVISION: 0

## 2014-08-02 ENCOUNTER — Other Ambulatory Visit: Payer: Self-pay

## 2014-08-04 ENCOUNTER — Other Ambulatory Visit: Payer: Self-pay | Admitting: Oncology

## 2014-08-05 ENCOUNTER — Ambulatory Visit (HOSPITAL_BASED_OUTPATIENT_CLINIC_OR_DEPARTMENT_OTHER): Payer: Medicare HMO | Admitting: Oncology

## 2014-08-05 ENCOUNTER — Telehealth: Payer: Self-pay | Admitting: *Deleted

## 2014-08-05 ENCOUNTER — Ambulatory Visit (HOSPITAL_BASED_OUTPATIENT_CLINIC_OR_DEPARTMENT_OTHER): Payer: Medicare HMO

## 2014-08-05 ENCOUNTER — Other Ambulatory Visit (HOSPITAL_BASED_OUTPATIENT_CLINIC_OR_DEPARTMENT_OTHER): Payer: Medicare HMO

## 2014-08-05 VITALS — BP 122/65 | HR 85 | Temp 97.9°F | Resp 19 | Ht 72.0 in | Wt 246.9 lb

## 2014-08-05 DIAGNOSIS — C9000 Multiple myeloma not having achieved remission: Secondary | ICD-10-CM

## 2014-08-05 DIAGNOSIS — D469 Myelodysplastic syndrome, unspecified: Secondary | ICD-10-CM

## 2014-08-05 DIAGNOSIS — Z5111 Encounter for antineoplastic chemotherapy: Secondary | ICD-10-CM

## 2014-08-05 DIAGNOSIS — D649 Anemia, unspecified: Secondary | ICD-10-CM

## 2014-08-05 DIAGNOSIS — Z86718 Personal history of other venous thrombosis and embolism: Secondary | ICD-10-CM

## 2014-08-05 DIAGNOSIS — D61818 Other pancytopenia: Secondary | ICD-10-CM

## 2014-08-05 DIAGNOSIS — D4620 Refractory anemia with excess of blasts, unspecified: Secondary | ICD-10-CM

## 2014-08-05 DIAGNOSIS — K5900 Constipation, unspecified: Secondary | ICD-10-CM

## 2014-08-05 DIAGNOSIS — E119 Type 2 diabetes mellitus without complications: Secondary | ICD-10-CM

## 2014-08-05 LAB — COMPREHENSIVE METABOLIC PANEL (CC13)
ALBUMIN: 3.2 g/dL — AB (ref 3.5–5.0)
ALT: 9 U/L (ref 0–55)
AST: 15 U/L (ref 5–34)
Alkaline Phosphatase: 51 U/L (ref 40–150)
Anion Gap: 7 mEq/L (ref 3–11)
BUN: 10.5 mg/dL (ref 7.0–26.0)
CO2: 25 meq/L (ref 22–29)
Calcium: 9 mg/dL (ref 8.4–10.4)
Chloride: 105 mEq/L (ref 98–109)
Creatinine: 1.1 mg/dL (ref 0.7–1.3)
EGFR: 79 mL/min/{1.73_m2} — ABNORMAL LOW (ref >90)
Glucose: 97 mg/dl (ref 70–140)
Potassium: 4.1 mEq/L (ref 3.5–5.1)
Sodium: 137 mEq/L (ref 136–145)
Total Bilirubin: 0.74 mg/dL (ref 0.20–1.20)
Total Protein: 8 g/dL (ref 6.4–8.3)

## 2014-08-05 LAB — CBC WITH DIFFERENTIAL/PLATELET
BASO%: 0.7 % (ref 0.0–2.0)
Basophils Absolute: 0 10*3/uL (ref 0.0–0.1)
EOS%: 2.9 % (ref 0.0–7.0)
Eosinophils Absolute: 0 10*3/uL (ref 0.0–0.5)
HCT: 28.5 % — ABNORMAL LOW (ref 38.4–49.9)
HGB: 9.9 g/dL — ABNORMAL LOW (ref 13.0–17.1)
LYMPH%: 72.7 % — ABNORMAL HIGH (ref 14.0–49.0)
MCH: 31.1 pg (ref 27.2–33.4)
MCHC: 34.7 g/dL (ref 32.0–36.0)
MCV: 89.6 fL (ref 79.3–98.0)
MONO#: 0.1 10*3/uL (ref 0.1–0.9)
MONO%: 7.2 % (ref 0.0–14.0)
NEUT%: 16.5 % — AB (ref 39.0–75.0)
NEUTROS ABS: 0.2 10*3/uL — AB (ref 1.5–6.5)
PLATELETS: 13 10*3/uL — AB (ref 140–400)
RBC: 3.18 10*6/uL — AB (ref 4.20–5.82)
RDW: 18.8 % — ABNORMAL HIGH (ref 11.0–14.6)
WBC: 1.4 10*3/uL — ABNORMAL LOW (ref 4.0–10.3)
lymph#: 1 10*3/uL (ref 0.9–3.3)
nRBC: 0 % (ref 0–0)

## 2014-08-05 LAB — HOLD TUBE, BLOOD BANK

## 2014-08-05 MED ORDER — ONDANSETRON HCL 8 MG PO TABS
ORAL_TABLET | ORAL | Status: AC
  Filled 2014-08-05: qty 1

## 2014-08-05 MED ORDER — TRAMADOL HCL 50 MG PO TABS: 50.0000 mg | ORAL_TABLET | Freq: Three times a day (TID) | ORAL | Status: DC | PRN

## 2014-08-05 MED ORDER — ONDANSETRON HCL 8 MG PO TABS
8.0000 mg | ORAL_TABLET | Freq: Once | ORAL | Status: AC
  Administered 2014-08-05: 8 mg via ORAL

## 2014-08-05 MED ORDER — AZACITIDINE CHEMO SQ INJECTION
76.0000 mg/m2 | Freq: Once | INTRAMUSCULAR | Status: AC
  Administered 2014-08-05: 180 mg via SUBCUTANEOUS
  Filled 2014-08-05: qty 7.2

## 2014-08-05 NOTE — Patient Instructions (Signed)
[facility] Discharge Instructions for Patients Receiving Chemotherapy  Today you received the following chemotherapy agents Vidaza  To help prevent nausea and vomiting after your treatment, we encourage you to take your nausea medication as directed.   If you develop nausea and vomiting that is not controlled by your nausea medication, call the clinic.   BELOW ARE SYMPTOMS THAT SHOULD BE REPORTED IMMEDIATELY:  *FEVER GREATER THAN 100.5 F  *CHILLS WITH OR WITHOUT FEVER  NAUSEA AND VOMITING THAT IS NOT CONTROLLED WITH YOUR NAUSEA MEDICATION  *UNUSUAL SHORTNESS OF BREATH  *UNUSUAL BRUISING OR BLEEDING  TENDERNESS IN MOUTH AND THROAT WITH OR WITHOUT PRESENCE OF ULCERS  *URINARY PROBLEMS  *BOWEL PROBLEMS  UNUSUAL RASH Items with * indicate a potential emergency and should be followed up as soon as possible.  Feel free to call the clinic you have any questions or concerns. The clinic phone number is [phone].

## 2014-08-05 NOTE — Progress Notes (Signed)
[facility] OFFICE PROGRESS NOTE   Diagnosis:  Myelodysplasia, multiple myeloma  INTERVAL HISTORY:    Mr. Gaglio returns as scheduled. He completed 4 days of 5 -azacytidine beginning 07/08/2014. He reports tolerating the chemotherapy well. No nausea or diarrhea. No inflammation at the injection sites. He complains of malaise. He has pain at the low back and left posterior chest. No fever or bleeding. He was transfused with packed red blood cells on 07/30/2014 Lelon Frohlich did not note an improvement in his energy level.  Objective:  Vital signs in last 24 hours:  Blood pressure 122/65, pulse 85, temperature 97.9 F (36.6 C), temperature source Oral, resp. rate 19, height 6' (1.829 m), weight 246 lb 14.4 oz (111.993 kg), SpO2 98 %.    HEENT:  No thrush or bleeding Resp:  Inspiratory rhonchi at the posterior chest bilaterally, no respiratory distress Cardio:  Regular rate and rhythm GI:  No hepatosplenomegaly Vascular:  Trace edema at the left greater than right lower leg Neuro: the leg strength appears intact bilaterally      Lab Results:  Lab Results  Component Value Date   WBC 1.4* 08/05/2014   HGB 9.9* 08/05/2014   HCT 28.5* 08/05/2014   MCV 89.6 08/05/2014   PLT 13* 08/05/2014   NEUTROABS 0.2* 08/05/2014     Medications: I have reviewed the patient's current medications.  Assessment/Plan: 1. Multiple myeloma, status post treatment with melphalan/prednisone/thalidomide beginning in November 2009. The thalidomide was increased to 200 mg daily beginning 09/11/2008. The serum M spike was slightly improved on 03/18/2009 and slightly increased on 05/09/2009. He has been maintained off of specific therapy for multiple myeloma since September 2010. The serum M spike was increased on 10/20/2011 at 4.6. He began Revlimid on 11/11/2011 with weekly dexamethasone. He began cycle 2 of Revlimid on 12/09/2011. The serum M spike was improved on 12/31/2011. The serum M spike was  slightly higher on 01/28/2012. He began cycle 4 on 02/03/2012. He developed recurrent hypercalcemia. Treatment was changed to Cytoxan/Velcade/Decadron beginning 03/02/2012. Cytoxan discontinued in January 2015  Treatment continued with Velcade/Decadron.   Stable serum M spike and IgG 10/04/2013.   Stable serum M spike 12/06/2013   Treatment placed on hold 01/10/2014 secondary to thrombocytopenia and a slightly higher M spike   Serum M spike and IgG increased 03/07/2014.  Cycle 1 Pomalidomide/dexamethasone 04/01/2014.  Pomalidomide subsequently placed on hold due to cytopenias.  Pomalidomide resumed at a reduced dose 05/23/2014.  Pomalidomide placed on hold 06/04/2014 due to progressive pancytopenia. 2. Chronic "dizziness."  3. Chest x-ray 11/01/2008 with a patchy opacity at the right midlung suspicious for pneumonia. He was treated with Avelox. 4. Low back pain with a radicular component. An MRI on 10/07/2008 showed no involvement of the lumbosacral spine with myeloma. 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 5. Bilateral neck pain, status post a CT 05/17/2008 with findings of spinal stenosis and osteoarthritis. 6. 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 Red cell microcytosis, likely related to hemoglobin C trait. A ferritin level returned elevated and a stool Hemoccult was negative 01/23/2010. He underwent a colonoscopy in 2009. 8. Elevated beta-2 microglobulin level. 9. History of hypertension. Blood pressure medication placed on hold December 2015 due to hypotension. 10. History of a herniated disk at L4-L5 on MRI an 01/11/2002. 11. Hyperlipidemia. 12. Gastroesophageal reflux disease. 13. History of atypical angina. 14. History of rectal bleeding-large  external hemorrhoids noted 02/25/2012. The stool was Hemoccult  positive. He has a history of colon polyps. He is followed by [facility]. He was diagnosed with hemorrhoids in September of 2013 while hospitalized. He underwent a band procedure 03/09/2013. He has had no further rectal bleeding. 15. Superficial venous thrombosis of the greater saphenous vein on a Doppler 09/27/2008. Negative for deep vein thrombosis. Symptoms improved with aspirin. 16. Neck pain with numbness/tingling in the arms, hands, and low back with proximal right leg weakness. An MRI of the cervical spine 05/30/2009 showed multilevel spondylosis with mild cord edema at C4-C5 and C5-C6. He was noted to have an enhancing lesion of the cord at T3-T4 of unclear etiology. In the lumbar spine there was no change in the spondylosis at L3-L4 and L4-L5. There was no involvement of the cervical or lumbar spine with myeloma. He is status post C4-C5, C5-C6, and C6-C7 anterior cervical diskectomy with fusion by Dr. Annette Stable 08/01/2009. 17. Thrombocytopenia-progressive, secondary to myelodysplasia and 5 azacytidine. 18. Indeterminate-age deep vein thrombosis of the left lower extremity on a venous Doppler 08/15/2009. There were also findings consistent with superficial thrombosis involving the right lower extremity 08/15/2009. Coumadin was discontinued in October 2011. 19. Type 2 diabetes. Now on an oral hypoglycemic 20. History of hematuria, followed at [facility]. Urinalysis was negative for blood on 07/23/2011. 21. Radicular back pain with MRI of the lumbar spine on 10/19/2011 showing nerve root impingement at L2-L3, L3-L4 and L4-L5 with the most severe at the right L3 nerve roots due to a large disc fragment. Associated right leg weakness. He underwent right L2-3 decompressive laminotomy with right L2 and L3 decompressive foraminotomy; right L2-3 microdiscectomy on 11/01/2011. 22. Constipation. He continues a laxative regimen. 23. Hospitalization 11/08/2011 through 11/10/2011 with orthostatic  hypotension/dizziness. He improved with intravenous hydration. He had mild hypotension when here on 11/18/2011. We recommended discontinuing Norvasc and Proscar. 24. Hypercalcemia status post pamidronate 11/03/2011. Recurrent hypercalcemia 02/16/2012 status post Zometa. The calcium has remained in normal range. 25. Fractured tooth with associated pain. Status post a tooth extraction by Dr. Enrique Sack, Zometa was placed on hold. 26. Exertional dyspnea. Stable. He was referred to cardiology, pulmonary function studies 06/15/2013 showed a moderately severe diffusion defect. He utilizes supplemental oxygen as needed. He is followed by pulmonary. 27. 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 28. 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   Status post cycle 1 5-azacytidine beginning 07/08/2014 4 days (day 5 held). 29.  history of Hypotension. Blood pressure medication placed on hold December 2015.     Disposition:   Mr. Ferrall completed 4 days of 5 azacytidine with cycle 1. He did not receive additional treatment secondary to the New Year's holiday. The plan is to proceed with cycle 2 today. He has persistent severe pancytopenia. He knows to contact us for bleeding or a fever. He will receive 5 days of 5 azacytidine with this cycle. He will return for a laboratory check twice weekly. Mr. Dillion will be scheduled for an office visit  08/16/2014.  Betsy Coder, MD  08/05/2014  10:56 AM

## 2014-08-05 NOTE — Telephone Encounter (Signed)
Dr. Benay Spice made aware of pt's ANC 0.2 and PLT 13. Seen in office today. Neutropenic precautions reviewed.

## 2014-08-06 ENCOUNTER — Telehealth: Payer: Self-pay | Admitting: Oncology

## 2014-08-06 ENCOUNTER — Ambulatory Visit (HOSPITAL_BASED_OUTPATIENT_CLINIC_OR_DEPARTMENT_OTHER): Payer: Medicare HMO

## 2014-08-06 DIAGNOSIS — D469 Myelodysplastic syndrome, unspecified: Secondary | ICD-10-CM

## 2014-08-06 DIAGNOSIS — Z5111 Encounter for antineoplastic chemotherapy: Secondary | ICD-10-CM

## 2014-08-06 DIAGNOSIS — C9000 Multiple myeloma not having achieved remission: Secondary | ICD-10-CM

## 2014-08-06 MED ORDER — ONDANSETRON HCL 8 MG PO TABS
8.0000 mg | ORAL_TABLET | Freq: Once | ORAL | Status: AC
  Administered 2014-08-06: 8 mg via ORAL

## 2014-08-06 MED ORDER — ONDANSETRON HCL 8 MG PO TABS
ORAL_TABLET | ORAL | Status: AC
  Filled 2014-08-06: qty 1

## 2014-08-06 MED ORDER — AZACITIDINE CHEMO SQ INJECTION
76.0000 mg/m2 | Freq: Once | INTRAMUSCULAR | Status: AC
  Administered 2014-08-06: 180 mg via SUBCUTANEOUS
  Filled 2014-08-06: qty 7.2

## 2014-08-06 NOTE — Telephone Encounter (Signed)
S/w pt's wife Langley Gauss confirming labs/ov per 01/25 POF, they will pick up a schedule today when he comes in for his treatment.... KJ

## 2014-08-06 NOTE — Progress Notes (Signed)
Neutropenia precautions reviewed with patient and spouse. Thermometer given and AVS reviewed for when to call clinic for alarming symptoms. Patient and spouse verbalize understanding.

## 2014-08-06 NOTE — Patient Instructions (Signed)
[facility] Discharge Instructions for Patients Receiving Chemotherapy  Today you received the following chemotherapy agent: Vidaza   To help prevent nausea and vomiting after your treatment, we encourage you to take your nausea medication as prescribed.    If you develop nausea and vomiting that is not controlled by your nausea medication, call the clinic.   BELOW ARE SYMPTOMS THAT SHOULD BE REPORTED IMMEDIATELY:  *FEVER GREATER THAN 100.5 F  *CHILLS WITH OR WITHOUT FEVER  NAUSEA AND VOMITING THAT IS NOT CONTROLLED WITH YOUR NAUSEA MEDICATION  *UNUSUAL SHORTNESS OF BREATH  *UNUSUAL BRUISING OR BLEEDING  TENDERNESS IN MOUTH AND THROAT WITH OR WITHOUT PRESENCE OF ULCERS  *URINARY PROBLEMS  *BOWEL PROBLEMS  UNUSUAL RASH Items with * indicate a potential emergency and should be followed up as soon as possible.  Feel free to call the clinic you have any questions or concerns. The clinic phone number is [phone].

## 2014-08-07 ENCOUNTER — Ambulatory Visit (HOSPITAL_BASED_OUTPATIENT_CLINIC_OR_DEPARTMENT_OTHER): Payer: Medicare HMO

## 2014-08-07 DIAGNOSIS — D469 Myelodysplastic syndrome, unspecified: Secondary | ICD-10-CM

## 2014-08-07 DIAGNOSIS — C9000 Multiple myeloma not having achieved remission: Secondary | ICD-10-CM

## 2014-08-07 DIAGNOSIS — Z5111 Encounter for antineoplastic chemotherapy: Secondary | ICD-10-CM

## 2014-08-07 MED ORDER — ONDANSETRON HCL 8 MG PO TABS
ORAL_TABLET | ORAL | Status: AC
Start: 2014-08-07 — End: 2014-08-07
  Filled 2014-08-07: qty 1

## 2014-08-07 MED ORDER — ONDANSETRON HCL 8 MG PO TABS
8.0000 mg | ORAL_TABLET | Freq: Once | ORAL | Status: AC
  Administered 2014-08-07: 8 mg via ORAL

## 2014-08-07 MED ORDER — AZACITIDINE CHEMO SQ INJECTION
180.0000 mg | Freq: Once | INTRAMUSCULAR | Status: AC
  Administered 2014-08-07: 180 mg via SUBCUTANEOUS
  Filled 2014-08-07: qty 7.2

## 2014-08-08 ENCOUNTER — Ambulatory Visit (HOSPITAL_BASED_OUTPATIENT_CLINIC_OR_DEPARTMENT_OTHER): Payer: Medicare HMO

## 2014-08-08 DIAGNOSIS — C9000 Multiple myeloma not having achieved remission: Secondary | ICD-10-CM

## 2014-08-08 DIAGNOSIS — D469 Myelodysplastic syndrome, unspecified: Secondary | ICD-10-CM

## 2014-08-08 DIAGNOSIS — Z5111 Encounter for antineoplastic chemotherapy: Secondary | ICD-10-CM

## 2014-08-08 MED ORDER — ONDANSETRON HCL 8 MG PO TABS
ORAL_TABLET | ORAL | Status: AC
  Filled 2014-08-08: qty 1

## 2014-08-08 MED ORDER — AZACITIDINE CHEMO SQ INJECTION
76.0000 mg/m2 | Freq: Once | INTRAMUSCULAR | Status: AC
  Administered 2014-08-08: 180 mg via SUBCUTANEOUS
  Filled 2014-08-08: qty 7.2

## 2014-08-08 MED ORDER — ONDANSETRON HCL 8 MG PO TABS
8.0000 mg | ORAL_TABLET | Freq: Once | ORAL | Status: AC
  Administered 2014-08-08: 8 mg via ORAL

## 2014-08-08 NOTE — Patient Instructions (Signed)
[name] Discharge Instructions for Patients Receiving Chemotherapy  Today you received the following chemotherapy agents Vidaza  To help prevent nausea and vomiting after your treatment, we encourage you to take your nausea medication as directed   If you develop nausea and vomiting that is not controlled by your nausea medication, call the clinic.   BELOW ARE SYMPTOMS THAT SHOULD BE REPORTED IMMEDIATELY:  *FEVER GREATER THAN 100.5 F  *CHILLS WITH OR WITHOUT FEVER  NAUSEA AND VOMITING THAT IS NOT CONTROLLED WITH YOUR NAUSEA MEDICATION  *UNUSUAL SHORTNESS OF BREATH  *UNUSUAL BRUISING OR BLEEDING  TENDERNESS IN MOUTH AND THROAT WITH OR WITHOUT PRESENCE OF ULCERS  *URINARY PROBLEMS  *BOWEL PROBLEMS  UNUSUAL RASH Items with * indicate a potential emergency and should be followed up as soon as possible.  Feel free to call the clinic you have any questions or concerns. The clinic phone number is [phone].

## 2014-08-08 NOTE — Progress Notes (Signed)
Pt complains of headache and dizziness,  BP 87/45, manual 88/60, symptoms reviewed with MD, pt denies n/v, bleeding, fever.Verbal Order "okay to treat" per Dr. Benay Spice.

## 2014-08-09 ENCOUNTER — Ambulatory Visit (HOSPITAL_BASED_OUTPATIENT_CLINIC_OR_DEPARTMENT_OTHER): Payer: Medicare HMO

## 2014-08-09 DIAGNOSIS — C9000 Multiple myeloma not having achieved remission: Secondary | ICD-10-CM

## 2014-08-09 DIAGNOSIS — Z5111 Encounter for antineoplastic chemotherapy: Secondary | ICD-10-CM

## 2014-08-09 DIAGNOSIS — D469 Myelodysplastic syndrome, unspecified: Secondary | ICD-10-CM

## 2014-08-09 MED ORDER — ONDANSETRON HCL 8 MG PO TABS
ORAL_TABLET | ORAL | Status: AC
Start: 2014-08-09 — End: 2014-08-09
  Filled 2014-08-09: qty 1

## 2014-08-09 MED ORDER — ONDANSETRON HCL 8 MG PO TABS
8.0000 mg | ORAL_TABLET | Freq: Once | ORAL | Status: AC
  Administered 2014-08-09: 8 mg via ORAL

## 2014-08-09 MED ORDER — AZACITIDINE CHEMO SQ INJECTION
76.0000 mg/m2 | Freq: Once | INTRAMUSCULAR | Status: AC
  Administered 2014-08-09: 180 mg via SUBCUTANEOUS
  Filled 2014-08-09: qty 7.2

## 2014-08-09 NOTE — Patient Instructions (Signed)
[facility] Discharge Instructions for Patients Receiving Chemotherapy  Today you received the following chemotherapy agents Vidaza  To help prevent nausea and vomiting after your treatment, we encourage you to take your nausea medication as directed   If you develop nausea and vomiting that is not controlled by your nausea medication, call the clinic.   BELOW ARE SYMPTOMS THAT SHOULD BE REPORTED IMMEDIATELY:  *FEVER GREATER THAN 100.5 F  *CHILLS WITH OR WITHOUT FEVER  NAUSEA AND VOMITING THAT IS NOT CONTROLLED WITH YOUR NAUSEA MEDICATION  *UNUSUAL SHORTNESS OF BREATH  *UNUSUAL BRUISING OR BLEEDING  TENDERNESS IN MOUTH AND THROAT WITH OR WITHOUT PRESENCE OF ULCERS  *URINARY PROBLEMS  *BOWEL PROBLEMS  UNUSUAL RASH Items with * indicate a potential emergency and should be followed up as soon as possible.  Feel free to call the clinic you have any questions or concerns. The clinic phone number is [phone].

## 2014-08-12 ENCOUNTER — Other Ambulatory Visit: Payer: Self-pay | Admitting: Oncology

## 2014-08-12 ENCOUNTER — Other Ambulatory Visit (HOSPITAL_BASED_OUTPATIENT_CLINIC_OR_DEPARTMENT_OTHER): Payer: Medicare HMO

## 2014-08-12 ENCOUNTER — Ambulatory Visit (HOSPITAL_BASED_OUTPATIENT_CLINIC_OR_DEPARTMENT_OTHER): Payer: Medicare HMO

## 2014-08-12 DIAGNOSIS — C9000 Multiple myeloma not having achieved remission: Secondary | ICD-10-CM

## 2014-08-12 DIAGNOSIS — Z5189 Encounter for other specified aftercare: Secondary | ICD-10-CM

## 2014-08-12 DIAGNOSIS — D61818 Other pancytopenia: Secondary | ICD-10-CM

## 2014-08-12 DIAGNOSIS — D469 Myelodysplastic syndrome, unspecified: Secondary | ICD-10-CM

## 2014-08-12 DIAGNOSIS — D61810 Antineoplastic chemotherapy induced pancytopenia: Secondary | ICD-10-CM

## 2014-08-12 LAB — CBC WITH DIFFERENTIAL/PLATELET
BASO%: 0.6 % (ref 0.0–2.0)
Basophils Absolute: 0 10*3/uL (ref 0.0–0.1)
EOS ABS: 0.1 10*3/uL (ref 0.0–0.5)
EOS%: 5.6 % (ref 0.0–7.0)
HCT: 27.2 % — ABNORMAL LOW (ref 38.4–49.9)
HGB: 9.7 g/dL — ABNORMAL LOW (ref 13.0–17.1)
LYMPH%: 72.5 % — ABNORMAL HIGH (ref 14.0–49.0)
MCH: 32 pg (ref 27.2–33.4)
MCHC: 35.7 g/dL (ref 32.0–36.0)
MCV: 89.8 fL (ref 79.3–98.0)
MONO#: 0.1 10*3/uL (ref 0.1–0.9)
MONO%: 4.5 % (ref 0.0–14.0)
NEUT%: 16.8 % — ABNORMAL LOW (ref 39.0–75.0)
NEUTROS ABS: 0.3 10*3/uL — AB (ref 1.5–6.5)
NRBC: 0 % (ref 0–0)
PLATELETS: 15 10*3/uL — AB (ref 140–400)
RBC: 3.03 10*6/uL — ABNORMAL LOW (ref 4.20–5.82)
RDW: 18.6 % — ABNORMAL HIGH (ref 11.0–14.6)
WBC: 1.8 10*3/uL — ABNORMAL LOW (ref 4.0–10.3)
lymph#: 1.3 10*3/uL (ref 0.9–3.3)

## 2014-08-12 LAB — HOLD TUBE, BLOOD BANK

## 2014-08-12 MED ORDER — ONDANSETRON HCL 8 MG PO TABS
8.0000 mg | ORAL_TABLET | Freq: Once | ORAL | Status: AC
  Administered 2014-08-12: 8 mg via ORAL

## 2014-08-12 MED ORDER — ONDANSETRON HCL 8 MG PO TABS
ORAL_TABLET | ORAL | Status: AC
  Filled 2014-08-12: qty 1

## 2014-08-12 MED ORDER — PEGFILGRASTIM INJECTION 6 MG/0.6ML
6.0000 mg | Freq: Once | SUBCUTANEOUS | Status: AC
  Administered 2014-08-12: 6 mg via SUBCUTANEOUS
  Filled 2014-08-12: qty 0.6

## 2014-08-12 MED ORDER — AZACITIDINE CHEMO SQ INJECTION: 180.0000 mg | Freq: Once | INTRAMUSCULAR | Status: DC

## 2014-08-12 NOTE — Progress Notes (Signed)
CBC reviewed by MD. Per Dr. Benay Spice; OK to treat with WBC 1.8 ANC 0.3 and Platelets 15.

## 2014-08-12 NOTE — Patient Instructions (Signed)
Pegfilgrastim injection What is this medicine? PEGFILGRASTIM (peg fil GRA stim) is a long-acting granulocyte colony-stimulating factor that stimulates the growth of neutrophils, a type of white blood cell important in the body's fight against infection. It is used to reduce the incidence of fever and infection in patients with certain types of cancer who are receiving chemotherapy that affects the bone marrow. This medicine may be used for other purposes; ask your health care provider or pharmacist if you have questions. COMMON BRAND NAME(S): Neulasta What should I tell my health care provider before I take this medicine? They need to know if you have any of these conditions: -latex allergy -ongoing radiation therapy -sickle cell disease -skin reactions to acrylic adhesives (On-Body Injector only) -an unusual or allergic reaction to pegfilgrastim, filgrastim, other medicines, foods, dyes, or preservatives -pregnant or trying to get pregnant -breast-feeding How should I use this medicine? This medicine is for injection under the skin. If you get this medicine at home, you will be taught how to prepare and give the pre-filled syringe or how to use the On-body Injector. Refer to the patient Instructions for Use for detailed instructions. Use exactly as directed. Take your medicine at regular intervals. Do not take your medicine more often than directed. It is important that you put your used needles and syringes in a special sharps container. Do not put them in a trash can. If you do not have a sharps container, call your pharmacist or healthcare provider to get one. Talk to your pediatrician regarding the use of this medicine in children. Special care may be needed. Overdosage: If you think you have taken too much of this medicine contact a poison control center or emergency room at once. NOTE: This medicine is only for you. Do not share this medicine with others. What if I miss a dose? It is  important not to miss your dose. Call your doctor or health care professional if you miss your dose. If you miss a dose due to an On-body Injector failure or leakage, a new dose should be administered as soon as possible using a single prefilled syringe for manual use. What may interact with this medicine? Interactions have not been studied. Give your health care provider a list of all the medicines, herbs, non-prescription drugs, or dietary supplements you use. Also tell them if you smoke, drink alcohol, or use illegal drugs. Some items may interact with your medicine. This list may not describe all possible interactions. Give your health care provider a list of all the medicines, herbs, non-prescription drugs, or dietary supplements you use. Also tell them if you smoke, drink alcohol, or use illegal drugs. Some items may interact with your medicine. What should I watch for while using this medicine? You may need blood work done while you are taking this medicine. If you are going to need a MRI, CT scan, or other procedure, tell your doctor that you are using this medicine (On-Body Injector only). What side effects may I notice from receiving this medicine? Side effects that you should report to your doctor or health care professional as soon as possible: -allergic reactions like skin rash, itching or hives, swelling of the face, lips, or tongue -dizziness -fever -pain, redness, or irritation at site where injected -pinpoint red spots on the skin -shortness of breath or breathing problems -stomach or side pain, or pain at the shoulder -swelling -tiredness -trouble passing urine Side effects that usually do not require medical attention (report to your doctor   or health care professional if they continue or are bothersome): -bone pain -muscle pain This list may not describe all possible side effects. Call your doctor for medical advice about side effects. You may report side effects to FDA at  1-800-FDA-1088. Where should I keep my medicine? Keep out of the reach of children. Store pre-filled syringes in a refrigerator between 2 and 8 degrees C (36 and 46 degrees F). Do not freeze. Keep in carton to protect from light. Throw away this medicine if it is left out of the refrigerator for more than 48 hours. Throw away any unused medicine after the expiration date. NOTE: This sheet is a summary. It may not cover all possible information. If you have questions about this medicine, talk to your doctor, pharmacist, or health care provider.  2015, [employer]vier/[employer] Standard. (2013-09-27 16:14:05)  

## 2014-08-16 ENCOUNTER — Ambulatory Visit (HOSPITAL_COMMUNITY)
Admission: RE | Admit: 2014-08-16 | Discharge: 2014-08-16 | Disposition: A | Discharge status: Home / Self Care | Payer: Medicare HMO | Source: Ambulatory Visit | Attending: Nurse Practitioner | Admitting: Nurse Practitioner

## 2014-08-16 ENCOUNTER — Ambulatory Visit (HOSPITAL_BASED_OUTPATIENT_CLINIC_OR_DEPARTMENT_OTHER): Payer: Medicare HMO

## 2014-08-16 ENCOUNTER — Telehealth: Payer: Self-pay | Admitting: Nurse Practitioner

## 2014-08-16 ENCOUNTER — Ambulatory Visit (HOSPITAL_BASED_OUTPATIENT_CLINIC_OR_DEPARTMENT_OTHER): Payer: Medicare HMO | Admitting: Nurse Practitioner

## 2014-08-16 ENCOUNTER — Telehealth: Payer: Self-pay | Admitting: *Deleted

## 2014-08-16 ENCOUNTER — Other Ambulatory Visit: Payer: Self-pay

## 2014-08-16 VITALS — BP 103/51 | HR 97 | Temp 97.0°F | Resp 18

## 2014-08-16 VITALS — BP 99/52 | HR 90 | Temp 97.8°F | Resp 19 | Ht 72.0 in | Wt 238.5 lb

## 2014-08-16 DIAGNOSIS — D469 Myelodysplastic syndrome, unspecified: Secondary | ICD-10-CM

## 2014-08-16 DIAGNOSIS — C9000 Multiple myeloma not having achieved remission: Secondary | ICD-10-CM

## 2014-08-16 DIAGNOSIS — I959 Hypotension, unspecified: Secondary | ICD-10-CM

## 2014-08-16 DIAGNOSIS — D4620 Refractory anemia with excess of blasts, unspecified: Secondary | ICD-10-CM

## 2014-08-16 DIAGNOSIS — K5900 Constipation, unspecified: Secondary | ICD-10-CM

## 2014-08-16 DIAGNOSIS — D6481 Anemia due to antineoplastic chemotherapy: Secondary | ICD-10-CM

## 2014-08-16 DIAGNOSIS — Z86718 Personal history of other venous thrombosis and embolism: Secondary | ICD-10-CM

## 2014-08-16 DIAGNOSIS — E119 Type 2 diabetes mellitus without complications: Secondary | ICD-10-CM

## 2014-08-16 DIAGNOSIS — D630 Anemia in neoplastic disease: Secondary | ICD-10-CM

## 2014-08-16 LAB — CBC WITH DIFFERENTIAL/PLATELET
BASO%: 0.2 % (ref 0.0–2.0)
Basophils Absolute: 0 10*3/uL (ref 0.0–0.1)
EOS%: 1.8 % (ref 0.0–7.0)
Eosinophils Absolute: 0.1 10*3/uL (ref 0.0–0.5)
HCT: 26.7 % — ABNORMAL LOW (ref 38.4–49.9)
HEMOGLOBIN: 9.4 g/dL — AB (ref 13.0–17.1)
LYMPH%: 45.6 % (ref 14.0–49.0)
MCH: 32.2 pg (ref 27.2–33.4)
MCHC: 35.2 g/dL (ref 32.0–36.0)
MCV: 91.4 fL (ref 79.3–98.0)
MONO#: 0.4 10*3/uL (ref 0.1–0.9)
MONO%: 9.6 % (ref 0.0–14.0)
NEUT#: 1.9 10*3/uL (ref 1.5–6.5)
NEUT%: 42.8 % (ref 39.0–75.0)
PLATELETS: 8 10*3/uL — AB (ref 140–400)
RBC: 2.92 10*6/uL — ABNORMAL LOW (ref 4.20–5.82)
RDW: 19.2 % — ABNORMAL HIGH (ref 11.0–14.6)
WBC: 4.4 10*3/uL (ref 4.0–10.3)
lymph#: 2 10*3/uL (ref 0.9–3.3)
nRBC: 1 % — ABNORMAL HIGH (ref 0–0)

## 2014-08-16 LAB — HOLD TUBE, BLOOD BANK

## 2014-08-16 MED ORDER — SODIUM CHLORIDE 0.9 % IV SOLN
250.0000 mL | Freq: Once | INTRAVENOUS | Status: AC
  Administered 2014-08-16: 250 mL via INTRAVENOUS

## 2014-08-16 NOTE — Telephone Encounter (Signed)
Per staff message and POF I have scheduled appts. Advised scheduler of appts. JMW  

## 2014-08-16 NOTE — Progress Notes (Unsigned)
HAR called in on pt for platelets today

## 2014-08-16 NOTE — Patient Instructions (Signed)
Platelet Transfusion Information °This is information about transfusions of platelets. Platelets are tiny cells made by the bone marrow and found in the blood. When a blood vessel is damaged, platelets rush to the damaged area to help form a clot. This begins the healing process. When platelets get very low, your blood may have trouble clotting. This may be from: °· Illness. °· Blood disorder. °· Chemotherapy to treat cancer. °Often, lower platelet counts do not cause problems.  °Platelets usually last for 7 to 10 days. If they are not used in an injury, they are broken down by the liver or spleen. °Symptoms of low platelet count include: °· Nosebleeds. °· Bleeding gums. °· Heavy periods. °· Bruising and tiny blood spots in the skin. °¨ Pinpoint spots of bleeding (petechiae). °¨ Larger bruises (purpura). °· Bleeding can be more serious if it happens in the brain or bowel. °Platelet transfusions are often used to keep the platelet count at an acceptable level. Serious bleeding due to low platelets is uncommon. °RISKS AND COMPLICATIONS °Severe side effects from platelet transfusions are uncommon. Minor reactions may include: °· Itching. °· Rashes. °· High temperature and shivering. °Medications are available to stop transfusion reactions. Let your health care provider know if you develop any of the above problems.  °If you are having platelet transfusions frequently, they may get less effective. This is called becoming refractory to platelets. It is uncommon. This can happen from non-immune causes and immune causes. Non-immune causes include: °· High temperatures. °· Some medications. °· An enlarged spleen. °Immune causes happen when your body discovers the platelets are not your own and begins making antibodies against them. The antibodies kill the platelets quickly. Even with platelet transfusions, you may still notice problems with bleeding or bruising. Let your health care providers know about this. Other things  can be done to help if this happens.  °BEFORE THE PROCEDURE  °· Your health care provider will check your platelet count regularly. °· If the platelet count is too low, it may be necessary to have a platelet transfusion. °· This is more important before certain procedures with a risk of bleeding, such as a spinal tap. °· Platelet transfusion reduces the risk of bleeding during or after the procedure. °· Except in emergencies, giving a transfusion requires a written consent. °Before blood is taken from a donor, a complete history is taken to make sure the person has no history of previous diseases, nor engages in risky social behavior. Examples of this are intravenous drug use or sexual activity with multiple partners. This could lead to infected blood or blood products being used. This history is taken in spite of the extensive testing to make sure the blood is safe. All blood products transfused are tested to make sure it is a match for the person getting the blood. It is also checked for infections. Blood is the safest it has ever been. The risk of getting an infection is very low. °PROCEDURE °· The platelets are stored in small plastic bags that are kept at a low temperature. °· Each bag is called a unit and sometimes two units are given. They are given through an intravenous line by drip infusion over about one-half hour. °· Usually blood is collected from multiple people to get enough to transfuse. °· Sometimes, the platelets are collected from a single person. This is done using a special machine that separates the platelets from the blood. The machine is called an apheresis machine. Platelets collected in this   way are called apheresed platelets. Apheresed platelets reduce the risk of becoming sensitive to the platelets. This lowers the chances of having a transfusion reaction. °· As it only takes a short time to give the platelets, this treatment can be given in an outpatient department. Platelets can also be  given before or after other treatments. °SEEK IMMEDIATE MEDICAL CARE IF: °You have any of the following symptoms over the next 12 hours or several days: °· Shaking chills. °· Fever with a temperature greater than 102°F (38.9°C) develops. °· Back pain or muscle pain. °· People around you feel you are not acting correctly, or you are confused. °· Blood in the urine or bowel movements, or bleeding from any place in your body. °· Shortness of breath, or difficulty breathing. °· Dizziness. °· Fainting. °· You break out in a rash or develop hives. °· Decrease in the amount of urine you are putting out, or the urine turns a dark color or changes to pink, red, or brown. °· A severe headache or stiff neck. °· Bruising more easily. °Document Released: 04/25/2007 Document Revised: 11/12/2013 Document Reviewed: 04/25/2007 °[employer]® Patient Information ©2015 [employer]. This information is not intended to replace advice given to you by your health care provider. Make sure you discuss any questions you have with your health care provider. ° °

## 2014-08-16 NOTE — Progress Notes (Signed)
Called and LM with Ernestene Kiel, Nutritionist pt lost 8 pounds since 08/05/14 visit and has decreased appetite. Pt wife states she cant afford to buy the ensure/boost for pt but informed her we can sometimes provide coupons.

## 2014-08-16 NOTE — Progress Notes (Signed)
[facility] OFFICE PROGRESS NOTE   Diagnosis:  Myelodysplasia and multiple myeloma  INTERVAL HISTORY:   Mr. Bastin returns as scheduled. He completed cycle 2 5-azacytidine beginning 08/05/2014. He received Neulasta support. He notes an alteration in taste. He has difficulty sleeping at times. No nausea or vomiting. He has periodic loose stools. No more than 2 a day. He denies bleeding. No fever. No shaking chills.  Objective:  Vital signs in last 24 hours:  Blood pressure 99/52, pulse 90, temperature 97.8 F (36.6 C), resp. rate 19, height 6' (1.829 m), weight 238 lb 8 oz (108.183 kg), SpO2 97 %.    HEENT: No thrush or ulcers. Resp: Lungs clear bilaterally. Cardio: Regular rate and rhythm. GI: Abdomen soft and nontender. No hepatomegaly. Vascular: No leg edema. Neuro: Alert and oriented.    Lab Results:  Lab Results  Component Value Date   WBC 1.8* 08/12/2014   HGB 9.7* 08/12/2014   HCT 27.2* 08/12/2014   MCV 89.8 08/12/2014   PLT 15* 08/12/2014   NEUTROABS 0.3* 08/12/2014    Imaging:  No results found.  Medications: I have reviewed the patient's current medications.  Assessment/Plan: 1. Multiple myeloma, status post treatment with melphalan/prednisone/thalidomide beginning in November 2009. The thalidomide was increased to 200 mg daily beginning 09/11/2008. The serum M spike was slightly improved on 03/18/2009 and slightly increased on 05/09/2009. He has been maintained off of specific therapy for multiple myeloma since September 2010. The serum M spike was increased on 10/20/2011 at 4.6. He began Revlimid on 11/11/2011 with weekly dexamethasone. He began cycle 2 of Revlimid on 12/09/2011. The serum M spike was improved on 12/31/2011. The serum M spike was slightly higher on 01/28/2012. He began cycle 4 on 02/03/2012. He developed recurrent hypercalcemia. Treatment was changed to Cytoxan/Velcade/Decadron beginning 03/02/2012. Cytoxan discontinued in  January 2015  Treatment continued with Velcade/Decadron.   Stable serum M spike and IgG 10/04/2013.   Stable serum M spike 12/06/2013   Treatment placed on hold 01/10/2014 secondary to thrombocytopenia and a slightly higher M spike   Serum M spike and IgG increased 03/07/2014.  Cycle 1 Pomalidomide/dexamethasone 04/01/2014.  Pomalidomide subsequently placed on hold due to cytopenias.  Pomalidomide resumed at a reduced dose 05/23/2014.  Pomalidomide placed on hold 06/04/2014 due to progressive pancytopenia. 2. Chronic "dizziness."  3. Chest x-ray 11/01/2008 with a patchy opacity at the right midlung suspicious for pneumonia. He was treated with Avelox. 4. Low back pain with a radicular component. An MRI on 10/07/2008 showed no involvement of the lumbosacral spine with myeloma. 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 5. Bilateral neck pain, status post a CT 05/17/2008 with findings of spinal stenosis and osteoarthritis. 6. 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 Red cell microcytosis, likely related to hemoglobin C trait. A ferritin level returned elevated and a stool Hemoccult was negative 01/23/2010. He underwent a colonoscopy in 2009. 8. Elevated beta-2 microglobulin level. 9. History of hypertension. Blood pressure medication placed on hold December 2015 due to hypotension. 10. History of a herniated disk at L4-L5 on MRI an 01/11/2002. 11. Hyperlipidemia. 12. Gastroesophageal reflux disease. 13. History of atypical angina. 14. History of rectal bleeding-large external hemorrhoids noted 02/25/2012. The stool was Hemoccult positive. He has a history of colon polyps. He is followed by  GI. He was diagnosed with hemorrhoids in September of 2013 while hospitalized. He underwent a  band procedure 03/09/2013. He has had  no further rectal bleeding. 15. Superficial venous thrombosis of the greater saphenous vein on a Doppler 09/27/2008. Negative for deep vein thrombosis. Symptoms improved with aspirin. 16. Neck pain with numbness/tingling in the arms, hands, and low back with proximal right leg weakness. An MRI of the cervical spine 05/30/2009 showed multilevel spondylosis with mild cord edema at C4-C5 and C5-C6. He was noted to have an enhancing lesion of the cord at T3-T4 of unclear etiology. In the lumbar spine there was no change in the spondylosis at L3-L4 and L4-L5. There was no involvement of the cervical or lumbar spine with myeloma. He is status post C4-C5, C5-C6, and C6-C7 anterior cervical diskectomy with fusion by Dr. Annette Stable 08/01/2009. 17. Thrombocytopenia-progressive, secondary to myelodysplasia and 5 azacytidine. 18. Indeterminate-age deep vein thrombosis of the left lower extremity on a venous Doppler 08/15/2009. There were also findings consistent with superficial thrombosis involving the right lower extremity 08/15/2009. Coumadin was discontinued in October 2011. 19. Type 2 diabetes. Now on an oral hypoglycemic 20. History of hematuria, followed at [facility]. Urinalysis was negative for blood on 07/23/2011. 21. Radicular back pain with MRI of the lumbar spine on 10/19/2011 showing nerve root impingement at L2-L3, L3-L4 and L4-L5 with the most severe at the right L3 nerve roots due to a large disc fragment. Associated right leg weakness. He underwent right L2-3 decompressive laminotomy with right L2 and L3 decompressive foraminotomy; right L2-3 microdiscectomy on 11/01/2011. 22. Constipation. He continues a laxative regimen. 23. Hospitalization 11/08/2011 through 11/10/2011 with orthostatic hypotension/dizziness. He improved with intravenous hydration. He had mild hypotension when here on 11/18/2011. We recommended discontinuing Norvasc and Proscar. 24. Hypercalcemia status post pamidronate 11/03/2011.  Recurrent hypercalcemia 02/16/2012 status post Zometa. The calcium has remained in normal range. 25. Fractured tooth with associated pain. Status post a tooth extraction by Dr. Enrique Sack, Zometa was placed on hold. 26. Exertional dyspnea. Stable. He was referred to cardiology, pulmonary function studies 06/15/2013 showed a moderately severe diffusion defect. He utilizes supplemental oxygen as needed. He is followed by pulmonary. 27. 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 28. 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   Status post cycle 1 5-azacytidine beginning 07/08/2014 4 days (day 5 held).  Cycle 2 5-azacytidine beginning 08/05/2014 5 days with Neulasta support. 29. History of hypotension. Blood pressure medication placed on hold December 2015.     Disposition: Mr. Candee appears unchanged. He has now completed 2 cycles of 5-azacytidine. We will obtain a CBC today. Labs thereafter will be on a Tuesday/Friday schedule with transfusion support as needed.  We will see him in follow-up prior to proceeding with cycle 3 5-azacytidine on 09/02/2014. He will contact the office in the interim with bleeding, fever, chills, other signs of infection.  Plan reviewed with Dr. Benay Spice.    Ned Card ANP/GNP-BC   08/16/2014  2:04 PM

## 2014-08-16 NOTE — Telephone Encounter (Signed)
Pt confirmed labs/ov per 02/05 POF, gave pt AVS..... KJ, msg was sent to add chemo inj

## 2014-08-17 LAB — PREPARE PLATELET PHERESIS: Unit division: 0

## 2014-08-20 ENCOUNTER — Ambulatory Visit: Payer: Medicare HMO | Admitting: Nutrition

## 2014-08-20 NOTE — Progress Notes (Signed)
Nutrition follow-up completed with patient over the telephone. Spoke with patient's wife who reports patient continues to have a poor appetite. Weight has decreased and was documented as 238.5 pounds February 5, down from 245.5 pounds. Patient is drinking 2 oral nutrition supplements daily. Patient's wife is encouraging patient to eat small amounts frequently. Patient constipation has resolved.  Nutrition diagnosis: Unintended weight loss continues.  Intervention: Encouraged patient's wife to continue to offer patient high-calorie high-protein foods frequently throughout the day. Continue oral nutrition supplements twice a day to 3 times a day. Recommended patient continue on bowel regimen to avoid constipation. Will mail patient additional coupons for oral nutrition supplements. Will provide samples supplements when patient returns to the [place].  Monitoring, evaluation, goals: Patient will work to increase oral intake to minimize further weight loss.  Next visit: Monday, February 22 in chemotherapy.  **Disclaimer: This note was dictated with voice recognition software. Similar sounding words can inadvertently be transcribed and this note may contain transcription errors which may not have been corrected upon publication of note.**

## 2014-08-21 ENCOUNTER — Encounter: Payer: Self-pay | Admitting: Family

## 2014-08-21 ENCOUNTER — Ambulatory Visit (INDEPENDENT_AMBULATORY_CARE_PROVIDER_SITE_OTHER): Payer: Medicare HMO | Admitting: Family

## 2014-08-21 VITALS — BP 130/74 | HR 114 | Temp 97.5°F | Ht 72.0 in | Wt 241.0 lb

## 2014-08-21 DIAGNOSIS — E118 Type 2 diabetes mellitus with unspecified complications: Secondary | ICD-10-CM

## 2014-08-21 DIAGNOSIS — E119 Type 2 diabetes mellitus without complications: Secondary | ICD-10-CM

## 2014-08-21 DIAGNOSIS — E11621 Type 2 diabetes mellitus with foot ulcer: Secondary | ICD-10-CM

## 2014-08-21 DIAGNOSIS — J069 Acute upper respiratory infection, unspecified: Secondary | ICD-10-CM

## 2014-08-21 MED ORDER — ONETOUCH DELICA LANCETS FINE MISC: 1.0000 | Freq: Every day | Status: DC

## 2014-08-21 MED ORDER — GLUCOSE BLOOD VI STRP: 1.0000 | ORAL_STRIP | Status: DC | PRN

## 2014-08-21 MED ORDER — METHYLPREDNISOLONE (PAK) 4 MG PO TABS: ORAL_TABLET | ORAL | Status: DC

## 2014-08-21 MED ORDER — LEVOFLOXACIN 500 MG PO TABS: 500.0000 mg | ORAL_TABLET | Freq: Every day | ORAL | Status: DC

## 2014-08-21 NOTE — Progress Notes (Signed)
   Subjective:    Patient ID: [name], male    DOB: May 17, 1936, 79 y.o.   MRN: [id number]  URI  This is a chronic problem. The current episode started more than 1 month ago. The problem has been waxing and waning. Associated symptoms include congestion, coughing, headaches, a plugged ear sensation, rhinorrhea, sinus pain and sneezing. Pertinent negatives include no dysuria, ear pain, nausea or wheezing. He has tried increased fluids for the symptoms. The treatment provided mild relief.    *Pt has Multiple  Myeloma   Review of Systems  Constitutional: Negative.   HENT: Positive for congestion, rhinorrhea and sneezing. Negative for ear pain.   Respiratory: Positive for cough. Negative for wheezing.   Cardiovascular: Negative.   Gastrointestinal: Negative.  Negative for nausea.  Endocrine: Negative.   Genitourinary: Negative.  Negative for dysuria.  Musculoskeletal: Negative.   Neurological: Positive for headaches.  Hematological: Negative.   Psychiatric/Behavioral: Negative.   All other systems reviewed and are negative.      Objective:   Physical Exam  Constitutional: He is oriented to person, place, and time. He appears well-developed and well-nourished. No distress.  HENT:  Head: Normocephalic.  Right Ear: External ear normal.  Left Ear: External ear normal.  Nasal passage erythemas with mild swelling  Oropharynx erythemas   Eyes: Pupils are equal, round, and reactive to light. Right eye exhibits no discharge. Left eye exhibits no discharge.  Neck: Normal range of motion. Neck supple. No thyromegaly present.  Cardiovascular: Normal rate, regular rhythm, normal heart sounds and intact distal pulses.   No murmur heard. Pulmonary/Chest: Effort normal and breath sounds normal. No respiratory distress. He has no wheezes.  Abdominal: Soft. Bowel sounds are normal. He exhibits no distension. There is no tenderness.  Musculoskeletal: Normal range of motion. He exhibits no edema  or tenderness.  Neurological: He is alert and oriented to person, place, and time. He has normal reflexes. No cranial nerve deficit.  Skin: Skin is warm and dry. No rash noted. No erythema.  Psychiatric: He has a normal mood and affect. His behavior is normal. Judgment and thought content normal.  Vitals reviewed.     BP 130/74 mmHg  Pulse 114  Temp(Src) 97.5 F (36.4 C) (Oral)  Ht 6' (1.829 m)  Wt 241 lb (109.317 kg)  BMI 32.68 kg/m2     Assessment & Plan:  1. Upper respiratory infection - Take meds as prescribed - Use a cool mist humidifier  -Use saline nose sprays frequently -Saline irrigations of the nose can be very helpful if done frequently.  * 4X daily for 1 week*  * Use of a nettie pot can be helpful with this. Follow directions with this* -Force fluids -For any cough or congestion  Use plain Mucinex- regular strength or max strength is fine   * Children- consult with Pharmacist for dosing -For fever or aces or pains- take tylenol or ibuprofen appropriate for age and weight.  * for fevers greater than 101 orally you may alternate ibuprofen and tylenol every  3 hours. -Throat lozenges if help - levofloxacin (LEVAQUIN) 500 MG tablet; Take 1 tablet (500 mg total) by mouth daily.  Dispense: 7 tablet; Refill: 0 - methylPREDNIsolone (MEDROL DOSPACK) 4 MG tablet; follow package directions  Dispense: 21 tablet; Refill: 0  2. Diabetic foot -Do not pick- Pt has low plts -Examine feet daily - Ambulatory referral to [place], FNP

## 2014-08-21 NOTE — Patient Instructions (Addendum)
Upper Respiratory Infection, Adult An upper respiratory infection (URI) is also sometimes known as the common cold. The upper respiratory tract includes the nose, sinuses, throat, trachea, and bronchi. Bronchi are the airways leading to the lungs. Most people improve within 1 week, but symptoms can last up to 2 weeks. A residual cough may last even longer.  CAUSES Many different viruses can infect the tissues lining the upper respiratory tract. The tissues become irritated and inflamed and often become very moist. Mucus production is also common. A cold is contagious. You can easily spread the virus to others by oral contact. This includes kissing, sharing a glass, coughing, or sneezing. Touching your mouth or nose and then touching a surface, which is then touched by another person, can also spread the virus. SYMPTOMS  Symptoms typically develop 1 to 3 days after you come in contact with a cold virus. Symptoms vary from person to person. They may include:  Runny nose.  Sneezing.  Nasal congestion.  Sinus irritation.  Sore throat.  Loss of voice (laryngitis).  Cough.  Fatigue.  Muscle aches.  Loss of appetite.  Headache.  Low-grade fever. DIAGNOSIS  You might diagnose your own cold based on familiar symptoms, since most people get a cold 2 to 3 times a year. Your caregiver can confirm this based on your exam. Most importantly, your caregiver can check that your symptoms are not due to another disease such as strep throat, sinusitis, pneumonia, asthma, or epiglottitis. Blood tests, throat tests, and X-rays are not necessary to diagnose a common cold, but they may sometimes be helpful in excluding other more serious diseases. Your caregiver will decide if any further tests are required. RISKS AND COMPLICATIONS  You may be at risk for a more severe case of the common cold if you smoke cigarettes, have chronic heart disease (such as heart failure) or lung disease (such as asthma), or if  you have a weakened immune system. The very young and very old are also at risk for more serious infections. Bacterial sinusitis, middle ear infections, and bacterial pneumonia can complicate the common cold. The common cold can worsen asthma and chronic obstructive pulmonary disease (COPD). Sometimes, these complications can require emergency medical care and may be life-threatening. PREVENTION  The best way to protect against getting a cold is to practice good hygiene. Avoid oral or hand contact with people with cold symptoms. Wash your hands often if contact occurs. There is no clear evidence that vitamin C, vitamin E, echinacea, or exercise reduces the chance of developing a cold. However, it is always recommended to get plenty of rest and practice good nutrition. TREATMENT  Treatment is directed at relieving symptoms. There is no cure. Antibiotics are not effective, because the infection is caused by a virus, not by bacteria. Treatment may include:  Increased fluid intake. Sports drinks offer valuable electrolytes, sugars, and fluids.  Breathing heated mist or steam (vaporizer or shower).  Eating chicken soup or other clear broths, and maintaining good nutrition.  Getting plenty of rest.  Using gargles or lozenges for comfort.  Controlling fevers with ibuprofen or acetaminophen as directed by your caregiver.  Increasing usage of your inhaler if you have asthma. Zinc gel and zinc lozenges, taken in the first 24 hours of the common cold, can shorten the duration and lessen the severity of symptoms. Pain medicines may help with fever, muscle aches, and throat pain. A variety of non-prescription medicines are available to treat congestion and runny nose. Your caregiver   can make recommendations and may suggest nasal or lung inhalers for other symptoms.  HOME CARE INSTRUCTIONS   Only take over-the-counter or prescription medicines for pain, discomfort, or fever as directed by your  caregiver.  Use a warm mist humidifier or inhale steam from a shower to increase air moisture. This may keep secretions moist and make it easier to breathe.  Drink enough water and fluids to keep your urine clear or pale yellow.  Rest as needed.  Return to work when your temperature has returned to normal or as your caregiver advises. You may need to stay home longer to avoid infecting others. You can also use a face mask and careful hand washing to prevent spread of the virus. SEEK MEDICAL CARE IF:   After the first few days, you feel you are getting worse rather than better.  You need your caregiver's advice about medicines to control symptoms.  You develop chills, worsening shortness of breath, or brown or red sputum. These may be signs of pneumonia.  You develop yellow or brown nasal discharge or pain in the face, especially when you bend forward. These may be signs of sinusitis.  You develop a fever, swollen neck glands, pain with swallowing, or white areas in the back of your throat. These may be signs of strep throat. SEEK IMMEDIATE MEDICAL CARE IF:   You have a fever.  You develop severe or persistent headache, ear pain, sinus pain, or chest pain.  You develop wheezing, a prolonged cough, cough up blood, or have a change in your usual mucus (if you have chronic lung disease).  You develop sore muscles or a stiff neck. Document Released: 12/22/2000 Document Revised: 09/20/2011 Document Reviewed: 10/03/2013 Exit[employer] Patient Information 2015 [employer]. This information is not intended to replace advice given to you by your health care provider. Make sure you discuss any questions you have with your health care provider.  - Take meds as prescribed - Use a cool mist humidifier  -Use saline nose sprays frequently -Saline irrigations of the nose can be very helpful if done frequently.  * 4X daily for 1 week*  * Use of a nettie pot can be helpful with this. Follow  directions with this* -Force fluids -For any cough or congestion  Use plain Mucinex- regular strength or max strength is fine   * Children- consult with Pharmacist for dosing -For fever or aces or pains- take tylenol or ibuprofen appropriate for age and weight.  * for fevers greater than 101 orally you may alternate ibuprofen and tylenol every  3 hours. -Throat lozenges if help   Addis Bennie, FNP   

## 2014-08-28 ENCOUNTER — Telehealth: Payer: Self-pay | Admitting: Family

## 2014-08-28 NOTE — Telephone Encounter (Signed)
Pharmacy changed to [facility] per patients request

## 2014-09-01 ENCOUNTER — Other Ambulatory Visit: Payer: Self-pay | Admitting: Oncology

## 2014-09-01 DIAGNOSIS — D469 Myelodysplastic syndrome, unspecified: Principal | ICD-10-CM

## 2014-09-01 DIAGNOSIS — IMO0001 Reserved for inherently not codable concepts without codable children: Secondary | ICD-10-CM

## 2014-09-02 ENCOUNTER — Ambulatory Visit (HOSPITAL_BASED_OUTPATIENT_CLINIC_OR_DEPARTMENT_OTHER): Payer: Medicare HMO | Admitting: Nurse Practitioner

## 2014-09-02 ENCOUNTER — Ambulatory Visit: Payer: Medicare HMO | Admitting: Nutrition

## 2014-09-02 ENCOUNTER — Telehealth: Payer: Self-pay | Admitting: Nurse Practitioner

## 2014-09-02 ENCOUNTER — Telehealth: Payer: Self-pay | Admitting: *Deleted

## 2014-09-02 ENCOUNTER — Ambulatory Visit (HOSPITAL_BASED_OUTPATIENT_CLINIC_OR_DEPARTMENT_OTHER): Payer: Medicare HMO

## 2014-09-02 ENCOUNTER — Other Ambulatory Visit (HOSPITAL_BASED_OUTPATIENT_CLINIC_OR_DEPARTMENT_OTHER): Payer: Medicare HMO

## 2014-09-02 VITALS — BP 107/64 | HR 88 | Temp 98.3°F | Resp 18 | Ht 72.0 in

## 2014-09-02 DIAGNOSIS — D469 Myelodysplastic syndrome, unspecified: Secondary | ICD-10-CM

## 2014-09-02 DIAGNOSIS — D696 Thrombocytopenia, unspecified: Secondary | ICD-10-CM

## 2014-09-02 DIAGNOSIS — Z86718 Personal history of other venous thrombosis and embolism: Secondary | ICD-10-CM

## 2014-09-02 DIAGNOSIS — C9000 Multiple myeloma not having achieved remission: Secondary | ICD-10-CM

## 2014-09-02 DIAGNOSIS — Z5111 Encounter for antineoplastic chemotherapy: Secondary | ICD-10-CM

## 2014-09-02 LAB — CBC WITH DIFFERENTIAL/PLATELET
BASO%: 2 % (ref 0.0–2.0)
Basophils Absolute: 0.1 10*3/uL (ref 0.0–0.1)
EOS%: 1.2 % (ref 0.0–7.0)
Eosinophils Absolute: 0 10*3/uL (ref 0.0–0.5)
HCT: 32.5 % — ABNORMAL LOW (ref 38.4–49.9)
HEMOGLOBIN: 11.2 g/dL — AB (ref 13.0–17.1)
LYMPH%: 51.5 % — ABNORMAL HIGH (ref 14.0–49.0)
MCH: 31.9 pg (ref 27.2–33.4)
MCHC: 34.5 g/dL (ref 32.0–36.0)
MCV: 92.6 fL (ref 79.3–98.0)
MONO#: 0.5 10*3/uL (ref 0.1–0.9)
MONO%: 15.5 % — ABNORMAL HIGH (ref 0.0–14.0)
NEUT#: 1 10*3/uL — ABNORMAL LOW (ref 1.5–6.5)
NEUT%: 29.8 % — ABNORMAL LOW (ref 39.0–75.0)
Platelets: 349 10*3/uL (ref 140–400)
RBC: 3.51 10*6/uL — ABNORMAL LOW (ref 4.20–5.82)
RDW: 19.3 % — ABNORMAL HIGH (ref 11.0–14.6)
WBC: 3.4 10*3/uL — AB (ref 4.0–10.3)
lymph#: 1.8 10*3/uL (ref 0.9–3.3)

## 2014-09-02 LAB — COMPREHENSIVE METABOLIC PANEL (CC13)
ALK PHOS: 52 U/L (ref 40–150)
ALT: <6 U/L (ref 0–55)
AST: 17 U/L (ref 5–34)
Albumin: 3.3 g/dL — ABNORMAL LOW (ref 3.5–5.0)
Anion Gap: 8 mEq/L (ref 3–11)
BILIRUBIN TOTAL: 0.51 mg/dL (ref 0.20–1.20)
BUN: 16.3 mg/dL (ref 7.0–26.0)
CALCIUM: 9.4 mg/dL (ref 8.4–10.4)
CO2: 25 mEq/L (ref 22–29)
CREATININE: 1.1 mg/dL (ref 0.7–1.3)
Chloride: 103 mEq/L (ref 98–109)
EGFR: 79 mL/min/{1.73_m2} — ABNORMAL LOW (ref >90)
Glucose: 122 mg/dl (ref 70–140)
Potassium: 4 mEq/L (ref 3.5–5.1)
SODIUM: 136 meq/L (ref 136–145)
TOTAL PROTEIN: 8.3 g/dL (ref 6.4–8.3)

## 2014-09-02 MED ORDER — AZACITIDINE CHEMO SQ INJECTION
76.0000 mg/m2 | Freq: Once | INTRAMUSCULAR | Status: AC
  Administered 2014-09-02: 180 mg via SUBCUTANEOUS
  Filled 2014-09-02: qty 7.2

## 2014-09-02 MED ORDER — ONDANSETRON HCL 8 MG PO TABS
8.0000 mg | ORAL_TABLET | Freq: Once | ORAL | Status: AC
  Administered 2014-09-02: 8 mg via ORAL

## 2014-09-02 MED ORDER — ONDANSETRON HCL 8 MG PO TABS
ORAL_TABLET | ORAL | Status: AC
  Filled 2014-09-02: qty 1

## 2014-09-02 NOTE — Telephone Encounter (Signed)
Per staff message and POF I have scheduled appts. Advised scheduler of appts. JMW  

## 2014-09-02 NOTE — Progress Notes (Signed)
Ok to treat today with ANC 1.0 per office note.  Pt to receive neulasta support.

## 2014-09-02 NOTE — Progress Notes (Signed)
Nutrition follow-up completed with patient in chemotherapy. Patient receives treatment for multiple myeloma. Patient reports oral intake and appetite have improved. Weight is increased and was documented as 241 pounds February 10.  This is increased from 238.5 pounds February 5. Patient will drink 2 oral nutrition supplements daily when he can afford to purchase it.   Constipation has improved.  Nutrition diagnosis: Unintended weight loss improved.  Intervention: Recommended patient consume oral nutrition supplements twice a day to 3 times a day to strive for weight maintenance. Recommended patient continue bowel regimen for improving constipation. Provided coupons for oral nutrition supplements. Provided one complementary case of Ensure Plus. Teach back method was used.  Monitoring, evaluation, goals: Patient will work to consume adequate calories and protein for weight maintenance.  Next visit:Monday, March 21 during infusion.  **Disclaimer: This note was dictated with voice recognition software. Similar sounding words can inadvertently be transcribed and this note may contain transcription errors which may not have been corrected upon publication of note.**

## 2014-09-02 NOTE — Telephone Encounter (Signed)
Gave avs & calendar. Sent message to schedule treatment. °

## 2014-09-02 NOTE — Patient Instructions (Signed)
[facility] Discharge Instructions for Patients Receiving Chemotherapy  Today you received the following chemotherapy agents vidaza   To help prevent nausea and vomiting after your treatment, we encourage you to take your nausea medication as directed. If you develop nausea and vomiting that is not controlled by your nausea medication, call the clinic.   BELOW ARE SYMPTOMS THAT SHOULD BE REPORTED IMMEDIATELY:  *FEVER GREATER THAN 100.5 F  *CHILLS WITH OR WITHOUT FEVER  NAUSEA AND VOMITING THAT IS NOT CONTROLLED WITH YOUR NAUSEA MEDICATION  *UNUSUAL SHORTNESS OF BREATH  *UNUSUAL BRUISING OR BLEEDING  TENDERNESS IN MOUTH AND THROAT WITH OR WITHOUT PRESENCE OF ULCERS  *URINARY PROBLEMS  *BOWEL PROBLEMS  UNUSUAL RASH Items with * indicate a potential emergency and should be followed up as soon as possible.  Feel free to call the clinic you have any questions or concerns. The clinic phone number is [phone].  

## 2014-09-02 NOTE — Progress Notes (Signed)
[facility] OFFICE PROGRESS NOTE   Diagnosis:  MDS, multiple myeloma  INTERVAL HISTORY:   [name] returns as scheduled. He completed cycle 2 5-azacytidine beginning 08/05/2014. He denies nausea/vomiting. No mouth sores. He denies fever and chills. He feels "cold" at times. No bleeding. He has dyspnea on exertion which is stable. Bowels are moving. He has occasional loose stools. Appetite is better. He recently completed antibiotic and steroids for an upper respiratory infection  Objective:  Vital signs in last 24 hours:  Blood pressure 107/64, pulse 88, temperature 98.3 F (36.8 C), resp. rate 18, height 6' (1.829 m), weight 0 lb (0 kg), SpO2 98 %.    HEENT: No thrush or ulcers. Resp: Lungs clear bilaterally. Cardio: Regular rate and rhythm. GI: Abdomen soft and nontender. No organomegaly.  Vascular: No leg edema. Neuro: Alert and oriented.    Lab Results:  Lab Results  Component Value Date   WBC 3.4* 09/02/2014   HGB 11.2* 09/02/2014   HCT 32.5* 09/02/2014   MCV 92.6 09/02/2014   PLT 349 09/02/2014   NEUTROABS 1.0* 09/02/2014    Imaging:  No results found.  Medications: I have reviewed the patient's current medications.  Assessment/Plan: 1. Multiple myeloma, status post treatment with melphalan/prednisone/thalidomide beginning in November 2009. The thalidomide was increased to 200 mg daily beginning 09/11/2008. The serum M spike was slightly improved on 03/18/2009 and slightly increased on 05/09/2009. He has been maintained off of specific therapy for multiple myeloma since September 2010. The serum M spike was increased on 10/20/2011 at 4.6. He began Revlimid on 11/11/2011 with weekly dexamethasone. He began cycle 2 of Revlimid on 12/09/2011. The serum M spike was improved on 12/31/2011. The serum M spike was slightly higher on 01/28/2012. He began cycle 4 on 02/03/2012. He developed recurrent hypercalcemia. Treatment was changed to  Cytoxan/Velcade/Decadron beginning 03/02/2012. Cytoxan discontinued in January 2015  Treatment continued with Velcade/Decadron.   Stable serum M spike and IgG 10/04/2013.   Stable serum M spike 12/06/2013   Treatment placed on hold 01/10/2014 secondary to thrombocytopenia and a slightly higher M spike   Serum M spike and IgG increased 03/07/2014.  Cycle 1 Pomalidomide/dexamethasone 04/01/2014.  Pomalidomide subsequently placed on hold due to cytopenias.  Pomalidomide resumed at a reduced dose 05/23/2014.  Pomalidomide placed on hold 06/04/2014 due to progressive pancytopenia. 2. Chronic "dizziness."  3. Chest x-ray 11/01/2008 with a patchy opacity at the right midlung suspicious for pneumonia. He was treated with Avelox. 4. Low back pain with a radicular component. An MRI on 10/07/2008 showed no involvement of the lumbosacral spine with myeloma. 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 5. Bilateral neck pain, status post a CT 05/17/2008 with findings of spinal stenosis and osteoarthritis. 6. 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 Red cell microcytosis, likely related to hemoglobin C trait. A ferritin level returned elevated and a stool Hemoccult was negative 01/23/2010. He underwent a colonoscopy in 2009. 8. Elevated beta-2 microglobulin level. 9. History of hypertension. Blood pressure medication placed on hold December 2015 due to hypotension. 10. History of a herniated disk at L4-L5 on MRI an 01/11/2002. 11. Hyperlipidemia. 12. Gastroesophageal reflux disease. 13. History of atypical angina. 14. History of rectal bleeding-large external hemorrhoids noted 02/25/2012. The stool was Hemoccult positive. He has a history of colon polyps. He is followed by [facility]. He was diagnosed with hemorrhoids in September  of 2013  while hospitalized. He underwent a band procedure 03/09/2013. He has had no further rectal bleeding. 15. Superficial venous thrombosis of the greater saphenous vein on a Doppler 09/27/2008. Negative for deep vein thrombosis. Symptoms improved with aspirin. 16. Neck pain with numbness/tingling in the arms, hands, and low back with proximal right leg weakness. An MRI of the cervical spine 05/30/2009 showed multilevel spondylosis with mild cord edema at C4-C5 and C5-C6. He was noted to have an enhancing lesion of the cord at T3-T4 of unclear etiology. In the lumbar spine there was no change in the spondylosis at L3-L4 and L4-L5. There was no involvement of the cervical or lumbar spine with myeloma. He is status post C4-C5, C5-C6, and C6-C7 anterior cervical diskectomy with fusion by Dr. Annette Stable 08/01/2009. 17. Thrombocytopenia-progressive, secondary to myelodysplasia and 5 azacytidine. 18. Indeterminate-age deep vein thrombosis of the left lower extremity on a venous Doppler 08/15/2009. There were also findings consistent with superficial thrombosis involving the right lower extremity 08/15/2009. Coumadin was discontinued in October 2011. 19. Type 2 diabetes. Now on an oral hypoglycemic 20. History of hematuria, followed at [facility]. Urinalysis was negative for blood on 07/23/2011. 21. Radicular back pain with MRI of the lumbar spine on 10/19/2011 showing nerve root impingement at L2-L3, L3-L4 and L4-L5 with the most severe at the right L3 nerve roots due to a large disc fragment. Associated right leg weakness. He underwent right L2-3 decompressive laminotomy with right L2 and L3 decompressive foraminotomy; right L2-3 microdiscectomy on 11/01/2011. 22. Constipation. He continues a laxative regimen. 23. Hospitalization 11/08/2011 through 11/10/2011 with orthostatic hypotension/dizziness. He improved with intravenous hydration. He had mild hypotension when here on 11/18/2011. We recommended discontinuing  Norvasc and Proscar. 24. Hypercalcemia status post pamidronate 11/03/2011. Recurrent hypercalcemia 02/16/2012 status post Zometa. The calcium has remained in normal range. 25. Fractured tooth with associated pain. Status post a tooth extraction by Dr. Enrique Sack, Zometa was placed on hold. 26. Exertional dyspnea. Stable. He was referred to cardiology, pulmonary function studies 06/15/2013 showed a moderately severe diffusion defect. He utilizes supplemental oxygen as needed. He is followed by pulmonary. 27. 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 28. 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   Status post cycle 1 5-azacytidine beginning 07/08/2014 4 days (day 5 held).  Cycle 2 5-azacytidine beginning 08/05/2014 5 days with Neulasta support.  Cycle 3 5-azacytidine beginning 09/02/2014 with Neulasta support. 29. History of hypotension. Blood pressure medication placed on hold December 2015.    Disposition: [name] appears stable. He has completed 2 cycles of 5-azacytidine. Blood counts are significantly improved. We will obtain a CBC on 09/03/2014 to confirm the accuracy of today's CBC.   Plan to proceed with cycle 3 5-azacytidine today as scheduled. He will again receive Neulasta support.  Begin weekly labs 09/09/2014.   We will see him in follow-up on 09/30/2014. He will contact the office in the interim with fever, chills, other signs of infection, bleeding.  Plan reviewed with Dr. Benay Spice.  Ned Card ANP/GNP-BC   09/02/2014  10:02 AM

## 2014-09-03 ENCOUNTER — Other Ambulatory Visit (HOSPITAL_BASED_OUTPATIENT_CLINIC_OR_DEPARTMENT_OTHER): Payer: Medicare HMO

## 2014-09-03 ENCOUNTER — Ambulatory Visit (HOSPITAL_BASED_OUTPATIENT_CLINIC_OR_DEPARTMENT_OTHER): Payer: Medicare HMO

## 2014-09-03 DIAGNOSIS — Z5111 Encounter for antineoplastic chemotherapy: Secondary | ICD-10-CM

## 2014-09-03 DIAGNOSIS — D469 Myelodysplastic syndrome, unspecified: Secondary | ICD-10-CM

## 2014-09-03 DIAGNOSIS — C9000 Multiple myeloma not having achieved remission: Secondary | ICD-10-CM

## 2014-09-03 LAB — CBC WITH DIFFERENTIAL/PLATELET
BASO%: 1.2 % (ref 0.0–2.0)
Basophils Absolute: 0.1 10*3/uL (ref 0.0–0.1)
EOS%: 2 % (ref 0.0–7.0)
Eosinophils Absolute: 0.1 10*3/uL (ref 0.0–0.5)
HCT: 31.8 % — ABNORMAL LOW (ref 38.4–49.9)
HGB: 11.2 g/dL — ABNORMAL LOW (ref 13.0–17.1)
LYMPH%: 44.4 % (ref 14.0–49.0)
MCH: 32.4 pg (ref 27.2–33.4)
MCHC: 35.2 g/dL (ref 32.0–36.0)
MCV: 91.9 fL (ref 79.3–98.0)
MONO#: 0.6 10*3/uL (ref 0.1–0.9)
MONO%: 14.1 % — AB (ref 0.0–14.0)
NEUT#: 1.6 10*3/uL (ref 1.5–6.5)
NEUT%: 38.3 % — AB (ref 39.0–75.0)
Platelets: 327 10*3/uL (ref 140–400)
RBC: 3.46 10*6/uL — AB (ref 4.20–5.82)
RDW: 19.2 % — AB (ref 11.0–14.6)
WBC: 4.1 10*3/uL (ref 4.0–10.3)
lymph#: 1.8 10*3/uL (ref 0.9–3.3)

## 2014-09-03 MED ORDER — AZACITIDINE CHEMO SQ INJECTION
76.0000 mg/m2 | Freq: Once | INTRAMUSCULAR | Status: AC
  Administered 2014-09-03: 180 mg via SUBCUTANEOUS
  Filled 2014-09-03: qty 7.2

## 2014-09-03 MED ORDER — ONDANSETRON HCL 8 MG PO TABS
ORAL_TABLET | ORAL | Status: AC
  Filled 2014-09-03: qty 1

## 2014-09-03 MED ORDER — ONDANSETRON HCL 8 MG PO TABS
8.0000 mg | ORAL_TABLET | Freq: Once | ORAL | Status: AC
  Administered 2014-09-03: 8 mg via ORAL

## 2014-09-03 NOTE — Patient Instructions (Signed)
[facility] Discharge Instructions for Patients Receiving Chemotherapy  Today you received the following chemotherapy agent: Vidaza   To help prevent nausea and vomiting after your treatment, we encourage you to take your nausea medication as prescribed.    If you develop nausea and vomiting that is not controlled by your nausea medication, call the clinic.   BELOW ARE SYMPTOMS THAT SHOULD BE REPORTED IMMEDIATELY:  *FEVER GREATER THAN 100.5 F  *CHILLS WITH OR WITHOUT FEVER  NAUSEA AND VOMITING THAT IS NOT CONTROLLED WITH YOUR NAUSEA MEDICATION  *UNUSUAL SHORTNESS OF BREATH  *UNUSUAL BRUISING OR BLEEDING  TENDERNESS IN MOUTH AND THROAT WITH OR WITHOUT PRESENCE OF ULCERS  *URINARY PROBLEMS  *BOWEL PROBLEMS  UNUSUAL RASH Items with * indicate a potential emergency and should be followed up as soon as possible.  Feel free to call the clinic you have any questions or concerns. The clinic phone number is [phone].

## 2014-09-04 ENCOUNTER — Ambulatory Visit (HOSPITAL_BASED_OUTPATIENT_CLINIC_OR_DEPARTMENT_OTHER): Payer: Medicare HMO

## 2014-09-04 DIAGNOSIS — C9000 Multiple myeloma not having achieved remission: Secondary | ICD-10-CM

## 2014-09-04 DIAGNOSIS — D469 Myelodysplastic syndrome, unspecified: Secondary | ICD-10-CM

## 2014-09-04 DIAGNOSIS — Z5111 Encounter for antineoplastic chemotherapy: Secondary | ICD-10-CM

## 2014-09-04 MED ORDER — AZACITIDINE CHEMO SQ INJECTION
180.0000 mg | Freq: Once | INTRAMUSCULAR | Status: AC
  Administered 2014-09-04: 180 mg via SUBCUTANEOUS
  Filled 2014-09-04: qty 7.2

## 2014-09-04 MED ORDER — ONDANSETRON HCL 8 MG PO TABS
8.0000 mg | ORAL_TABLET | Freq: Once | ORAL | Status: AC
  Administered 2014-09-04: 8 mg via ORAL

## 2014-09-04 MED ORDER — ONDANSETRON HCL 8 MG PO TABS
ORAL_TABLET | ORAL | Status: AC
Start: 2014-09-04 — End: 2014-09-04
  Filled 2014-09-04: qty 1

## 2014-09-04 NOTE — Patient Instructions (Signed)
[facility] Discharge Instructions for Patients Receiving Chemotherapy  Today you received the following chemotherapy agents: Vidaza.  To help prevent nausea and vomiting after your treatment, we encourage you to take your nausea medication as prescribed.   If you develop nausea and vomiting that is not controlled by your nausea medication, call the clinic.   BELOW ARE SYMPTOMS THAT SHOULD BE REPORTED IMMEDIATELY:  *FEVER GREATER THAN 100.5 F  *CHILLS WITH OR WITHOUT FEVER  NAUSEA AND VOMITING THAT IS NOT CONTROLLED WITH YOUR NAUSEA MEDICATION  *UNUSUAL SHORTNESS OF BREATH  *UNUSUAL BRUISING OR BLEEDING  TENDERNESS IN MOUTH AND THROAT WITH OR WITHOUT PRESENCE OF ULCERS  *URINARY PROBLEMS  *BOWEL PROBLEMS  UNUSUAL RASH Items with * indicate a potential emergency and should be followed up as soon as possible.  Feel free to call the clinic you have any questions or concerns. The clinic phone number is [phone].

## 2014-09-05 ENCOUNTER — Ambulatory Visit (HOSPITAL_BASED_OUTPATIENT_CLINIC_OR_DEPARTMENT_OTHER): Payer: Medicare HMO

## 2014-09-05 DIAGNOSIS — C9000 Multiple myeloma not having achieved remission: Secondary | ICD-10-CM

## 2014-09-05 DIAGNOSIS — Z5111 Encounter for antineoplastic chemotherapy: Secondary | ICD-10-CM

## 2014-09-05 DIAGNOSIS — D469 Myelodysplastic syndrome, unspecified: Secondary | ICD-10-CM

## 2014-09-05 MED ORDER — AZACITIDINE CHEMO SQ INJECTION
76.0000 mg/m2 | Freq: Once | INTRAMUSCULAR | Status: AC
  Administered 2014-09-05: 180 mg via SUBCUTANEOUS
  Filled 2014-09-05: qty 7.2

## 2014-09-05 MED ORDER — ONDANSETRON HCL 8 MG PO TABS
ORAL_TABLET | ORAL | Status: AC
  Filled 2014-09-05: qty 1

## 2014-09-05 MED ORDER — ONDANSETRON HCL 8 MG PO TABS
8.0000 mg | ORAL_TABLET | Freq: Once | ORAL | Status: AC
  Administered 2014-09-05: 8 mg via ORAL

## 2014-09-05 NOTE — Patient Instructions (Signed)
[facility] Discharge Instructions for Patients Receiving Chemotherapy  Today you received the following chemotherapy agents Vidaza.  To help prevent nausea and vomiting after your treatment, we encourage you to take your nausea medication as prescribed.   If you develop nausea and vomiting that is not controlled by your nausea medication, call the clinic.   BELOW ARE SYMPTOMS THAT SHOULD BE REPORTED IMMEDIATELY:  *FEVER GREATER THAN 100.5 F  *CHILLS WITH OR WITHOUT FEVER  NAUSEA AND VOMITING THAT IS NOT CONTROLLED WITH YOUR NAUSEA MEDICATION  *UNUSUAL SHORTNESS OF BREATH  *UNUSUAL BRUISING OR BLEEDING  TENDERNESS IN MOUTH AND THROAT WITH OR WITHOUT PRESENCE OF ULCERS  *URINARY PROBLEMS  *BOWEL PROBLEMS  UNUSUAL RASH Items with * indicate a potential emergency and should be followed up as soon as possible.  Feel free to call the clinic you have any questions or concerns. The clinic phone number is [phone].

## 2014-09-06 ENCOUNTER — Ambulatory Visit (HOSPITAL_BASED_OUTPATIENT_CLINIC_OR_DEPARTMENT_OTHER): Payer: Medicare HMO

## 2014-09-06 DIAGNOSIS — D469 Myelodysplastic syndrome, unspecified: Secondary | ICD-10-CM

## 2014-09-06 DIAGNOSIS — Z5111 Encounter for antineoplastic chemotherapy: Secondary | ICD-10-CM

## 2014-09-06 DIAGNOSIS — C9000 Multiple myeloma not having achieved remission: Secondary | ICD-10-CM

## 2014-09-06 MED ORDER — ONDANSETRON HCL 8 MG PO TABS
ORAL_TABLET | ORAL | Status: AC
  Filled 2014-09-06: qty 1

## 2014-09-06 MED ORDER — AZACITIDINE CHEMO SQ INJECTION
76.0000 mg/m2 | Freq: Once | INTRAMUSCULAR | Status: AC
  Administered 2014-09-06: 180 mg via SUBCUTANEOUS
  Filled 2014-09-06: qty 7.2

## 2014-09-06 MED ORDER — ONDANSETRON HCL 8 MG PO TABS
8.0000 mg | ORAL_TABLET | Freq: Once | ORAL | Status: AC
  Administered 2014-09-06: 8 mg via ORAL

## 2014-09-06 NOTE — Patient Instructions (Signed)
[facility] Discharge Instructions for Patients Receiving Chemotherapy  Today you received the following chemotherapy agents:  Vidaza  To help prevent nausea and vomiting after your treatment, we encourage you to take your nausea medication as ordered per MD.   If you develop nausea and vomiting that is not controlled by your nausea medication, call the clinic.   BELOW ARE SYMPTOMS THAT SHOULD BE REPORTED IMMEDIATELY:  *FEVER GREATER THAN 100.5 F  *CHILLS WITH OR WITHOUT FEVER  NAUSEA AND VOMITING THAT IS NOT CONTROLLED WITH YOUR NAUSEA MEDICATION  *UNUSUAL SHORTNESS OF BREATH  *UNUSUAL BRUISING OR BLEEDING  TENDERNESS IN MOUTH AND THROAT WITH OR WITHOUT PRESENCE OF ULCERS  *URINARY PROBLEMS  *BOWEL PROBLEMS  UNUSUAL RASH Items with * indicate a potential emergency and should be followed up as soon as possible.  Feel free to call the clinic you have any questions or concerns. The clinic phone number is [phone].

## 2014-09-07 ENCOUNTER — Ambulatory Visit: Payer: Self-pay

## 2014-09-08 ENCOUNTER — Other Ambulatory Visit: Payer: Self-pay | Admitting: Family Medicine

## 2014-09-09 ENCOUNTER — Ambulatory Visit (HOSPITAL_BASED_OUTPATIENT_CLINIC_OR_DEPARTMENT_OTHER): Payer: Medicare HMO

## 2014-09-09 ENCOUNTER — Ambulatory Visit: Payer: Self-pay

## 2014-09-09 ENCOUNTER — Other Ambulatory Visit (HOSPITAL_BASED_OUTPATIENT_CLINIC_OR_DEPARTMENT_OTHER): Payer: Medicare HMO

## 2014-09-09 ENCOUNTER — Ambulatory Visit: Payer: Medicare HMO | Admitting: Nutrition

## 2014-09-09 VITALS — BP 115/72 | HR 94 | Temp 98.3°F

## 2014-09-09 DIAGNOSIS — D469 Myelodysplastic syndrome, unspecified: Secondary | ICD-10-CM

## 2014-09-09 DIAGNOSIS — IMO0001 Reserved for inherently not codable concepts without codable children: Secondary | ICD-10-CM

## 2014-09-09 DIAGNOSIS — C9000 Multiple myeloma not having achieved remission: Secondary | ICD-10-CM

## 2014-09-09 DIAGNOSIS — Z5189 Encounter for other specified aftercare: Secondary | ICD-10-CM

## 2014-09-09 LAB — CBC WITH DIFFERENTIAL/PLATELET
BASO%: 1 % (ref 0.0–2.0)
Basophils Absolute: 0.1 10*3/uL (ref 0.0–0.1)
EOS%: 2.2 % (ref 0.0–7.0)
Eosinophils Absolute: 0.1 10*3/uL (ref 0.0–0.5)
HCT: 33 % — ABNORMAL LOW (ref 38.4–49.9)
HGB: 11.8 g/dL — ABNORMAL LOW (ref 13.0–17.1)
LYMPH%: 37 % (ref 14.0–49.0)
MCH: 32.1 pg (ref 27.2–33.4)
MCHC: 35.8 g/dL (ref 32.0–36.0)
MCV: 89.7 fL (ref 79.3–98.0)
MONO#: 0.4 10*3/uL (ref 0.1–0.9)
MONO%: 8.9 % (ref 0.0–14.0)
NEUT#: 2.5 10*3/uL (ref 1.5–6.5)
NEUT%: 50.9 % (ref 39.0–75.0)
Platelets: 265 10*3/uL (ref 140–400)
RBC: 3.68 10*6/uL — AB (ref 4.20–5.82)
RDW: 18.8 % — AB (ref 11.0–14.6)
WBC: 4.9 10*3/uL (ref 4.0–10.3)
lymph#: 1.8 10*3/uL (ref 0.9–3.3)

## 2014-09-09 MED ORDER — PEGFILGRASTIM INJECTION 6 MG/0.6ML ~~LOC~~
6.0000 mg | PREFILLED_SYRINGE | Freq: Once | SUBCUTANEOUS | Status: AC
  Administered 2014-09-09: 6 mg via SUBCUTANEOUS
  Filled 2014-09-09: qty 0.6

## 2014-09-09 NOTE — Progress Notes (Signed)
Per family member, patient has run out of Ensure Plus. Family requesting complementary Ensure Plus. Provided second case of Ensure Plus for patient.   Continue to work with him as needed.

## 2014-09-10 ENCOUNTER — Telehealth: Payer: Self-pay | Admitting: Family

## 2014-09-10 NOTE — Telephone Encounter (Signed)
Appointment given for Friday with Chritsy. Patient only wanted to see Christy.

## 2014-09-13 ENCOUNTER — Encounter: Payer: Self-pay | Admitting: Family

## 2014-09-13 ENCOUNTER — Ambulatory Visit (INDEPENDENT_AMBULATORY_CARE_PROVIDER_SITE_OTHER): Payer: Medicare HMO | Admitting: Family

## 2014-09-13 VITALS — BP 127/69 | HR 103 | Temp 97.2°F | Ht 72.0 in | Wt 237.0 lb

## 2014-09-13 DIAGNOSIS — M5116 Intervertebral disc disorders with radiculopathy, lumbar region: Secondary | ICD-10-CM

## 2014-09-13 DIAGNOSIS — H6122 Impacted cerumen, left ear: Secondary | ICD-10-CM

## 2014-09-13 MED ORDER — DICLOFENAC SODIUM 1 % TD GEL: 2.0000 g | Freq: Four times a day (QID) | TRANSDERMAL | Status: AC

## 2014-09-13 NOTE — Patient Instructions (Signed)
Cerumen Impaction °A cerumen impaction is when the wax in your ear forms a plug. This plug usually causes reduced hearing. Sometimes it also causes an earache or dizziness. Removing a cerumen impaction can be difficult and painful. The wax sticks to the ear canal. The canal is sensitive and bleeds easily. If you try to remove a heavy wax buildup with a cotton tipped swab, you may push it in further. °Irrigation with water, suction, and small ear curettes may be used to clear out the wax. If the impaction is fixed to the skin in the ear canal, ear drops may be needed for a few days to loosen the wax. People who build up a lot of wax frequently can use ear wax removal products available in your local drugstore. °SEEK MEDICAL CARE IF:  °You develop an earache, increased hearing loss, or marked dizziness. °Document Released: 08/05/2004 Document Revised: 09/20/2011 Document Reviewed: 09/25/2009 °ExitCare® Patient Information ©2015 [employer]. This information is not intended to replace advice given to you by your health care provider. Make sure you discuss any questions you have with your health care provider. ° °

## 2014-09-13 NOTE — Progress Notes (Signed)
   Subjective:    Patient ID: [name], male    DOB: [birth year], 79 y.o.   MRN: [id number]  Headache  Associated symptoms include coughing, ear pain and rhinorrhea. Pertinent negatives include no hearing loss or sore throat.  Otalgia  There is pain in the right ear. This is a new problem. The current episode started in the past 7 days. The problem occurs constantly. There has been no fever. The pain is at a severity of 5/10. Associated symptoms include coughing, headaches and rhinorrhea. Pertinent negatives include no diarrhea, ear discharge, hearing loss or sore throat. He has tried nothing for the symptoms. The treatment provided no relief. There is no history of a chronic ear infection.      Review of Systems  Constitutional: Negative.   HENT: Positive for ear pain and rhinorrhea. Negative for ear discharge, hearing loss and sore throat.   Respiratory: Positive for cough.   Cardiovascular: Negative.   Gastrointestinal: Negative.  Negative for diarrhea.  Endocrine: Negative.   Genitourinary: Negative.   Musculoskeletal: Negative.   Neurological: Positive for headaches.  Hematological: Negative.   Psychiatric/Behavioral: Negative.   All other systems reviewed and are negative.      Objective:   Physical Exam  Constitutional: He is oriented to person, place, and time. He appears well-developed and well-nourished. No distress.  HENT:  Head: Normocephalic.  Right Ear: External ear normal.  Mouth/Throat: Oropharynx is clear and moist.  Left ear impacted with cerumen  Eyes: Pupils are equal, round, and reactive to light. Right eye exhibits no discharge. Left eye exhibits no discharge.  Neck: Normal range of motion. Neck supple. No thyromegaly present.  Cardiovascular: Normal rate, regular rhythm, normal heart sounds and intact distal pulses.   No murmur heard. Pulmonary/Chest: Effort normal and breath sounds normal. No respiratory distress. He has no wheezes.  Abdominal: Soft.  Bowel sounds are normal. He exhibits no distension. There is no tenderness.  Musculoskeletal: Normal range of motion. He exhibits no edema or tenderness.  Neurological: He is alert and oriented to person, place, and time. He has normal reflexes. No cranial nerve deficit.  Skin: Skin is warm and dry. No rash noted. No erythema.  Psychiatric: He has a normal mood and affect. His behavior is normal. Judgment and thought content normal.  Vitals reviewed.  Left ear cleaned out and left TM WNL  BP 127/69 mmHg  Pulse 103  Temp(Src) 97.2 F (36.2 C) (Oral)  Ht 6' (1.829 m)  Wt 237 lb (107.502 kg)  BMI 32.14 kg/m2     Assessment & Plan:  1. Cerumen impaction, left -Keep clean and dry -Do not stick anything into ears -RTO prn  2. Lumbar disc herniation with radiculopathy -Rest Meds ordered this encounter  Medications  . diclofenac sodium (VOLTAREN) 1 % GEL    Sig: Apply 2 g topically 4 (four) times daily.    Dispense:  100 g    Refill:  11    Order Specific Question:  Supervising Provider    Answer:  Chipper Herb [[id number]]   Evelina Dun, FNP

## 2014-09-16 ENCOUNTER — Other Ambulatory Visit (HOSPITAL_BASED_OUTPATIENT_CLINIC_OR_DEPARTMENT_OTHER): Payer: Medicare HMO

## 2014-09-16 DIAGNOSIS — D469 Myelodysplastic syndrome, unspecified: Secondary | ICD-10-CM

## 2014-09-16 DIAGNOSIS — C9000 Multiple myeloma not having achieved remission: Secondary | ICD-10-CM

## 2014-09-16 LAB — CBC WITH DIFFERENTIAL/PLATELET
BASO%: 0.6 % (ref 0.0–2.0)
Basophils Absolute: 0.1 10*3/uL (ref 0.0–0.1)
EOS ABS: 0.1 10*3/uL (ref 0.0–0.5)
EOS%: 1.7 % (ref 0.0–7.0)
HCT: 35.8 % — ABNORMAL LOW (ref 38.4–49.9)
HGB: 11.8 g/dL — ABNORMAL LOW (ref 13.0–17.1)
LYMPH%: 18.8 % (ref 14.0–49.0)
MCH: 31 pg (ref 27.2–33.4)
MCHC: 33 g/dL (ref 32.0–36.0)
MCV: 94.1 fL (ref 79.3–98.0)
MONO#: 0.3 10*3/uL (ref 0.1–0.9)
MONO%: 4.1 % (ref 0.0–14.0)
NEUT%: 74.8 % (ref 39.0–75.0)
NEUTROS ABS: 6.4 10*3/uL (ref 1.5–6.5)
PLATELETS: 146 10*3/uL (ref 140–400)
RBC: 3.81 10*6/uL — ABNORMAL LOW (ref 4.20–5.82)
RDW: 22.6 % — AB (ref 11.0–14.6)
WBC: 8.5 10*3/uL (ref 4.0–10.3)
lymph#: 1.6 10*3/uL (ref 0.9–3.3)

## 2014-09-18 ENCOUNTER — Encounter: Payer: Self-pay | Admitting: Cardiovascular Disease

## 2014-09-18 ENCOUNTER — Other Ambulatory Visit: Payer: Self-pay | Admitting: Family Medicine

## 2014-09-18 ENCOUNTER — Ambulatory Visit (INDEPENDENT_AMBULATORY_CARE_PROVIDER_SITE_OTHER): Payer: Medicare HMO | Admitting: Cardiovascular Disease

## 2014-09-18 VITALS — BP 99/64 | HR 96 | Ht 72.0 in | Wt 238.0 lb

## 2014-09-18 DIAGNOSIS — R931 Abnormal findings on diagnostic imaging of heart and coronary circulation: Secondary | ICD-10-CM

## 2014-09-18 DIAGNOSIS — C9000 Multiple myeloma not having achieved remission: Secondary | ICD-10-CM

## 2014-09-18 DIAGNOSIS — I2510 Atherosclerotic heart disease of native coronary artery without angina pectoris: Secondary | ICD-10-CM

## 2014-09-18 DIAGNOSIS — E782 Mixed hyperlipidemia: Secondary | ICD-10-CM

## 2014-09-18 DIAGNOSIS — I10 Essential (primary) hypertension: Secondary | ICD-10-CM

## 2014-09-18 DIAGNOSIS — R609 Edema, unspecified: Secondary | ICD-10-CM

## 2014-09-18 MED ORDER — FUROSEMIDE 20 MG PO TABS: 20.0000 mg | ORAL_TABLET | ORAL | Status: DC | PRN

## 2014-09-18 NOTE — Progress Notes (Signed)
Patient ID: [name], male   DOB: 1936-06-15, 79 y.o.   MRN: [id number]      SUBJECTIVE: The patient presents for routine follow-up. He has a history of presumed coronary artery disease based on multiple nuclear stress test. His most recent study in August 2015 demonstrated inferior wall scar suggestive of prior infarct with no evidence of ischemia and normal left ventricular systolic function. He denies chest pain. He does have COPD but does not use inhalers. He has multiple myeloma. Hemoglobin was 9.4 and platelets were 8 on 08/16/14. He reportedly received a transfusion and hemoglobin on 3/7 was 11.8 with platelets 146.   Review of Systems: As per "subjective", otherwise negative.  Allergies  Allergen Reactions  . Penicillins Hives and Rash    Current Outpatient Prescriptions  Medication Sig Dispense Refill  . acetaminophen (TYLENOL) 500 MG tablet Take 500 mg by mouth every 6 (six) hours as needed for moderate pain.    Marland Kitchen acyclovir (ZOVIRAX) 400 MG tablet TAKE 1 TABLET (400 MG TOTAL) BY MOUTH 2 (TWO) TIMES DAILY. 60 tablet 5  . AMBULATORY NON FORMULARY MEDICATION O2 @@ 2LMP whenever active    . atorvastatin (LIPITOR) 20 MG tablet TAKE 1 TABLET (20 MG TOTAL) BY MOUTH DAILY. 90 tablet 3  . canagliflozin (INVOKANA) 100 MG TABS tablet Take 1 tablet (100 mg total) by mouth daily. 28 tablet 0  . clonazePAM (KLONOPIN) 0.5 MG tablet TAKE 1-2 TABLETS THIRTY MINUTES BEFORE BEDTIME AS NEEDED (Patient taking differently: Take 0.5 mg by mouth at bedtime as needed for anxiety (sleep). ) 30 tablet 5  . diclofenac sodium (VOLTAREN) 1 % GEL Apply 2 g topically 4 (four) times daily. 100 g 11  . furosemide (LASIX) 20 MG tablet Take 1 tablet (20 mg total) by mouth daily. (Patient taking differently: Take 20 mg by mouth every evening. ) 90 tablet 3  . glucose blood (ONETOUCH VERIO) test strip 1 each by Other route as needed for other. Use as instructed 100 each 2  . lactulose (CHRONULAC) 10 GM/15ML solution  Take 15 mLs (10 g total) by mouth 2 (two) times daily as needed. (Patient taking differently: Take 10 g by mouth 2 (two) times daily as needed for mild constipation. ) 480 mL 1  . loratadine (CLARITIN) 10 MG tablet TAKE 1 TABLET (10 MG TOTAL) BY MOUTH DAILY. 30 tablet 5  . metFORMIN (GLUCOPHAGE) 500 MG tablet TAKE 1 TABLET (500 MG TOTAL) BY MOUTH 2 (TWO) TIMES DAILY WITH A MEAL. 60 tablet 2  . ONETOUCH DELICA LANCETS FINE MISC 1 each by Does not apply route daily. 100 each 2  . OVER THE COUNTER MEDICATION Apply 1 application topically daily as needed (pain). Cream for pain.--Pain rub    . pantoprazole (PROTONIX) 40 MG tablet TAKE 1 TABLET (40 MG TOTAL) BY MOUTH DAILY. 30 tablet 2  . polyethylene glycol powder (GLYCOLAX/MIRALAX) powder TAKE 1-2 DOSES DAILY AS NEEDED 527 g 2  . potassium chloride SA (KLOR-CON M20) 20 MEQ tablet Take 1 tablet (20 mEq total) by mouth daily. 90 tablet 0  . prochlorperazine (COMPAZINE) 10 MG tablet TAKE 1 TABLET BY MOUTH EVERY SIX HOURS AS NEEDED 30 tablet 2  . tamsulosin (FLOMAX) 0.4 MG CAPS capsule TAKE 1 CAPSULE (0.4 MG TOTAL) BY MOUTH 2 (TWO) TIMES DAILY. WITH A MEAL. 180 capsule 5  . traMADol (ULTRAM) 50 MG tablet Take 1 tablet (50 mg total) by mouth every 8 (eight) hours as needed. 60 tablet 0   No current  facility-administered medications for this visit.    Past Medical History  Diagnosis Date  . GERD (gastroesophageal reflux disease)   . Essential hypertension, benign   . BPH (benign prostatic hyperplasia)   . Lumbar herniated disc     L4-5  . Hyperlipidemia   . Sleep apnea   . Multiple myeloma 02/19/2008  . Arthritis   . Diverticulosis   . Peptic ulcer disease   . Tubular adenoma 02/16/2008    Dr. Silvano Rusk  . MDS (myelodysplastic syndrome) 06/17/2014  . Diabetes mellitus without complication     Past Surgical History  Procedure Laterality Date  . Neck surgery    . Lumbar laminectomy/decompression microdiscectomy  11/01/2011    Procedure:  LUMBAR LAMINECTOMY/DECOMPRESSION MICRODISCECTOMY 2 LEVELS;  Surgeon: Charlie Pitter, MD;  Location: [facility];  Service: Neurosurgery;  Laterality: Right;  Lumbar Laminectomy/Microdiscectomy Decompression Lumbar Three-Four, Lumbar Four-Five   . Colonoscopy    . Hemorrhoid banding  2014  . Esophagogastroduodenoscopy      History   Social History  . Marital Status: Married    Spouse Name: N/A  . Number of Children: 4  . Years of Education: N/A   Occupational History  . retired-disabled-Construction    Social History Main Topics  . Smoking status: Former Smoker -- 2.00 packs/day for 20 years    Types: Cigarettes    Start date: 09/15/1962    Quit date: 07/12/1990  . Smokeless tobacco: Never Used  . Alcohol Use: 0.0 oz/week    0 Standard drinks or equivalent per week     Comment: "Very seldom"  . Drug Use: No  . Sexual Activity: No   Other Topics Concern  . Not on file   Social History Narrative   Pt was adopted.   Married   Scientist, product/process development Vitals:   09/18/14 1038  BP: 99/64  Pulse: 96  Height: 6' (1.829 m)  Weight: 238 lb (107.956 kg)  SpO2: 95%    PHYSICAL EXAM General: NAD HEENT: Normal. Neck: No JVD, no thyromegaly. Lungs: Diminished but clear without rales/wheezes. CV: Nondisplaced PMI. Regular rate and rhythm, normal S1/S2, no S3/S4, no murmur. No pretibial edema.Stasis dermatitis. Abdomen: Soft, obese, no distention.  Neurologic: Alert and oriented x 3.  Psych: Somewhat flat affect. Skin: Stasis dermatitis of legs. Musculoskeletal: No gross deformities. Extremities: No clubbing or cyanosis.   ECG: Most recent ECG reviewed.      ASSESSMENT AND PLAN: 1. CAD with abnormal nuclear study: Stable ischemic heart disease. ASA was stopped due to low Hgb and platelets. Continue Lipitor 20 mg. Prior nuclear stress test from 02/2014 noted above. 2. Essential HTN: Low normal today. Not on antihypertensives but is taking tamsulosin for prostate  disease. 3. COPD: Stable. Follows with pulmonary. 4. OSA: Using CPAP. 5. Multiple myeloma: Follows with hem-onc. Most recent CBC results from 3/7 noted above.  Dispo: f/u 1 year.  Kate Sable, M.D., F.A.C.C.

## 2014-09-18 NOTE — Patient Instructions (Signed)
Your physician wants you to follow-up in: 1 year with Dr. Virgina Jock will receive a reminder letter in the mail two months in advance. If you don't receive a letter, please call our office to schedule the follow-up appointment.  Your physician has recommended you make the following change in your medication:   TAKE LASIX AS NEEDED   CONTINUE ALL OTHER MEDICATIONS AS DIRECTED  Thank you for choosing [facility]!!

## 2014-09-20 ENCOUNTER — Encounter: Payer: Self-pay | Admitting: Internal Medicine

## 2014-09-23 ENCOUNTER — Other Ambulatory Visit (HOSPITAL_BASED_OUTPATIENT_CLINIC_OR_DEPARTMENT_OTHER): Payer: Medicare HMO

## 2014-09-23 DIAGNOSIS — C9000 Multiple myeloma not having achieved remission: Secondary | ICD-10-CM

## 2014-09-23 DIAGNOSIS — D469 Myelodysplastic syndrome, unspecified: Secondary | ICD-10-CM

## 2014-09-23 LAB — CBC WITH DIFFERENTIAL/PLATELET
BASO%: 0.8 % (ref 0.0–2.0)
Basophils Absolute: 0 10*3/uL (ref 0.0–0.1)
EOS%: 1.8 % (ref 0.0–7.0)
Eosinophils Absolute: 0.1 10*3/uL (ref 0.0–0.5)
HEMATOCRIT: 34.4 % — AB (ref 38.4–49.9)
HGB: 12.4 g/dL — ABNORMAL LOW (ref 13.0–17.1)
LYMPH%: 38.9 % (ref 14.0–49.0)
MCH: 31.7 pg (ref 27.2–33.4)
MCHC: 36 g/dL (ref 32.0–36.0)
MCV: 88 fL (ref 79.3–98.0)
MONO#: 0.1 10*3/uL (ref 0.1–0.9)
MONO%: 1.8 % (ref 0.0–14.0)
NEUT#: 2.2 10*3/uL (ref 1.5–6.5)
NEUT%: 56.7 % (ref 39.0–75.0)
Platelets: 178 10*3/uL (ref 140–400)
RBC: 3.91 10*6/uL — ABNORMAL LOW (ref 4.20–5.82)
RDW: 18.6 % — ABNORMAL HIGH (ref 11.0–14.6)
WBC: 3.9 10*3/uL — ABNORMAL LOW (ref 4.0–10.3)
lymph#: 1.5 10*3/uL (ref 0.9–3.3)

## 2014-09-25 ENCOUNTER — Other Ambulatory Visit: Payer: Self-pay | Admitting: Family Medicine

## 2014-09-26 ENCOUNTER — Other Ambulatory Visit: Payer: Self-pay | Admitting: Family Medicine

## 2014-09-27 ENCOUNTER — Telehealth: Payer: Self-pay | Admitting: Family Medicine

## 2014-09-29 ENCOUNTER — Other Ambulatory Visit: Payer: Self-pay | Admitting: Oncology

## 2014-09-30 ENCOUNTER — Ambulatory Visit: Payer: Medicare HMO | Admitting: Nutrition

## 2014-09-30 ENCOUNTER — Ambulatory Visit (HOSPITAL_BASED_OUTPATIENT_CLINIC_OR_DEPARTMENT_OTHER): Payer: Self-pay | Admitting: Oncology

## 2014-09-30 ENCOUNTER — Other Ambulatory Visit (HOSPITAL_BASED_OUTPATIENT_CLINIC_OR_DEPARTMENT_OTHER): Payer: Medicare HMO

## 2014-09-30 ENCOUNTER — Ambulatory Visit (HOSPITAL_BASED_OUTPATIENT_CLINIC_OR_DEPARTMENT_OTHER): Payer: Medicare HMO

## 2014-09-30 ENCOUNTER — Telehealth: Payer: Self-pay | Admitting: Oncology

## 2014-09-30 VITALS — BP 108/67 | HR 86 | Temp 97.4°F | Resp 18 | Ht 72.0 in

## 2014-09-30 DIAGNOSIS — E119 Type 2 diabetes mellitus without complications: Secondary | ICD-10-CM

## 2014-09-30 DIAGNOSIS — I959 Hypotension, unspecified: Secondary | ICD-10-CM

## 2014-09-30 DIAGNOSIS — I10 Essential (primary) hypertension: Secondary | ICD-10-CM

## 2014-09-30 DIAGNOSIS — D469 Myelodysplastic syndrome, unspecified: Secondary | ICD-10-CM

## 2014-09-30 DIAGNOSIS — C9000 Multiple myeloma not having achieved remission: Secondary | ICD-10-CM

## 2014-09-30 DIAGNOSIS — Z5111 Encounter for antineoplastic chemotherapy: Secondary | ICD-10-CM

## 2014-09-30 DIAGNOSIS — D6481 Anemia due to antineoplastic chemotherapy: Secondary | ICD-10-CM

## 2014-09-30 DIAGNOSIS — D630 Anemia in neoplastic disease: Secondary | ICD-10-CM

## 2014-09-30 DIAGNOSIS — D61818 Other pancytopenia: Secondary | ICD-10-CM

## 2014-09-30 DIAGNOSIS — Z86718 Personal history of other venous thrombosis and embolism: Secondary | ICD-10-CM

## 2014-09-30 LAB — CBC WITH DIFFERENTIAL/PLATELET
BASO%: 2.3 % — AB (ref 0.0–2.0)
Basophils Absolute: 0.1 10*3/uL (ref 0.0–0.1)
EOS%: 3.2 % (ref 0.0–7.0)
Eosinophils Absolute: 0.1 10*3/uL (ref 0.0–0.5)
HCT: 39.3 % (ref 38.4–49.9)
HGB: 13 g/dL (ref 13.0–17.1)
LYMPH#: 2.2 10*3/uL (ref 0.9–3.3)
LYMPH%: 59.7 % — ABNORMAL HIGH (ref 14.0–49.0)
MCH: 30.5 pg (ref 27.2–33.4)
MCHC: 33.2 g/dL (ref 32.0–36.0)
MCV: 91.8 fL (ref 79.3–98.0)
MONO#: 0.3 10*3/uL (ref 0.1–0.9)
MONO%: 7.4 % (ref 0.0–14.0)
NEUT#: 1 10*3/uL — ABNORMAL LOW (ref 1.5–6.5)
NEUT%: 27.4 % — ABNORMAL LOW (ref 39.0–75.0)
Platelets: 248 10*3/uL (ref 140–400)
RBC: 4.28 10*6/uL (ref 4.20–5.82)
RDW: 20.8 % — AB (ref 11.0–14.6)
WBC: 3.6 10*3/uL — ABNORMAL LOW (ref 4.0–10.3)

## 2014-09-30 LAB — COMPREHENSIVE METABOLIC PANEL (CC13)
ALT: 12 U/L (ref 0–55)
AST: 21 U/L (ref 5–34)
Albumin: 3.3 g/dL — ABNORMAL LOW (ref 3.5–5.0)
Alkaline Phosphatase: 50 U/L (ref 40–150)
Anion Gap: 8 mEq/L (ref 3–11)
BUN: 18.3 mg/dL (ref 7.0–26.0)
CALCIUM: 9.5 mg/dL (ref 8.4–10.4)
CO2: 23 mEq/L (ref 22–29)
CREATININE: 1.1 mg/dL (ref 0.7–1.3)
Chloride: 105 mEq/L (ref 98–109)
EGFR: 77 mL/min/{1.73_m2} — AB (ref >90)
Glucose: 97 mg/dl (ref 70–140)
POTASSIUM: 4.4 meq/L (ref 3.5–5.1)
SODIUM: 135 meq/L — AB (ref 136–145)
Total Bilirubin: 0.41 mg/dL (ref 0.20–1.20)
Total Protein: 9.5 g/dL — ABNORMAL HIGH (ref 6.4–8.3)

## 2014-09-30 MED ORDER — AZACITIDINE CHEMO SQ INJECTION: 75.0000 mg/m2 | Freq: Once | INTRAMUSCULAR | Status: DC

## 2014-09-30 MED ORDER — ONDANSETRON HCL 8 MG PO TABS
ORAL_TABLET | ORAL | Status: AC
  Filled 2014-09-30: qty 1

## 2014-09-30 MED ORDER — AZACITIDINE CHEMO SQ INJECTION
76.0000 mg/m2 | Freq: Once | INTRAMUSCULAR | Status: AC
  Administered 2014-09-30: 180 mg via SUBCUTANEOUS
  Filled 2014-09-30: qty 7.2

## 2014-09-30 MED ORDER — ONDANSETRON HCL 8 MG PO TABS
8.0000 mg | ORAL_TABLET | Freq: Once | ORAL | Status: AC
  Administered 2014-09-30: 8 mg via ORAL

## 2014-09-30 NOTE — Patient Instructions (Signed)
Azacitidine suspension for injection (subcutaneous use) What is this medicine? AZACITIDINE (ay [place]) is a chemotherapy drug. This medicine reduces the growth of cancer cells and can suppress the immune system. It is used for treating myelodysplastic syndrome or some types of leukemia. This medicine may be used for other purposes; ask your health care provider or pharmacist if you have questions. COMMON BRAND NAME(S): Vidaza What should I tell my health care provider before I take this medicine? They need to know if you have any of these conditions: -infection (especially a virus infection such as chickenpox, cold sores, or herpes) -kidney disease -liver disease -liver tumors -an unusual or allergic reaction to azacitidine, mannitol, other medicines, foods, dyes, or preservatives -pregnant or trying to get pregnant -breast-feeding How should I use this medicine? This medicine is for injection under the skin. It is administered in a hospital or clinic by a specially trained health care professional. Talk to your pediatrician regarding the use of this medicine in children. While this drug may be prescribed for selected conditions, precautions do apply. Overdosage: If you think you have taken too much of this medicine contact a poison control center or emergency room at once. NOTE: This medicine is only for you. Do not share this medicine with others. What if I miss a dose? It is important not to miss your dose. Call your doctor or health care professional if you are unable to keep an appointment. What may interact with this medicine? -vaccines Talk to your doctor or health care professional before taking any of these medicines: -acetaminophen -aspirin -ibuprofen -ketoprofen -naproxen This list may not describe all possible interactions. Give your health care provider a list of all the medicines, herbs, non-prescription drugs, or dietary supplements you use. Also tell them if you  smoke, drink alcohol, or use illegal drugs. Some items may interact with your medicine. What should I watch for while using this medicine? Visit your doctor for checks on your progress. This drug may make you feel generally unwell. This is not uncommon, as chemotherapy can affect healthy cells as well as cancer cells. Report any side effects. Continue your course of treatment even though you feel ill unless your doctor tells you to stop. In some cases, you may be given additional medicines to help with side effects. Follow all directions for their use. Call your doctor or health care professional for advice if you get a fever, chills or sore throat, or other symptoms of a cold or flu. Do not treat yourself. This drug decreases your body's ability to fight infections. Try to avoid being around people who are sick. This medicine may increase your risk to bruise or bleed. Call your doctor or health care professional if you notice any unusual bleeding. Be careful brushing and flossing your teeth or using a toothpick because you may get an infection or bleed more easily. If you have any dental work done, tell your dentist you are receiving this medicine. Avoid taking products that contain aspirin, acetaminophen, ibuprofen, naproxen, or ketoprofen unless instructed by your doctor. These medicines may hide a fever. Do not have any vaccinations without your doctor's approval and avoid anyone who has recently had oral polio vaccine. Do not become pregnant while taking this medicine. Women should inform their doctor if they wish to become pregnant or think they might be pregnant. There is a potential for serious side effects to an unborn child. Talk to your health care professional or pharmacist for more information.  Do not breast-feed an infant while taking this medicine. If you are a man, you should not father a child while receiving treatment. What side effects may I notice from receiving this medicine? Side  effects that you should report to your doctor or health care professional as soon as possible: -allergic reactions like skin rash, itching or hives, swelling of the face, lips, or tongue -low blood counts - this medicine may decrease the number of white blood cells, red blood cells and platelets. You may be at increased risk for infections and bleeding. -signs of infection - fever or chills, cough, sore throat, pain or difficulty passing urine -signs of decreased platelets or bleeding - bruising, pinpoint red spots on the skin, black, tarry stools, blood in the urine -signs of decreased red blood cells - unusually weak or tired, fainting spells, lightheadedness -reactions at the injection site including redness, pain, itching, or bruising -breathing problems -changes in vision -fever -mouth sores -stomach pain -vomiting Side effects that usually do not require medical attention (report to your doctor or health care professional if they continue or are bothersome): -constipation -diarrhea -loss of appetite -nausea -pain or redness at the injection site -weak or tired This list may not describe all possible side effects. Call your doctor for medical advice about side effects. You may report side effects to FDA at 1-800-FDA-1088. Where should I keep my medicine? This drug is given in a hospital or clinic and will not be stored at home. NOTE: This sheet is a summary. It may not cover all possible information. If you have questions about this medicine, talk to your doctor, pharmacist, or health care provider.  2015, [employer]. (2007-09-21 11:04:07)

## 2014-09-30 NOTE — Telephone Encounter (Signed)
gv adn printed appt sched and avs for pt for March and April....sed added tx.    °

## 2014-09-30 NOTE — Progress Notes (Signed)
Per Dr. Benay Spice: OK to treat with ANC 1.0. Dixie, RN in infusion room notified.

## 2014-09-30 NOTE — Progress Notes (Signed)
Brief nutrition follow-up with patient and wife. Patient is being treated for multiple myeloma. Oral intake and appetite have improved. Weight documented as 238 pounds on March 9. Patient continues to drink oral nutrition supplements based on his oral intake. Constipation and loose stools are alternating.  Nutrition diagnosis: Unintended weight loss continues.  Intervention: Patient educated to continue strategies for adequate calories and protein intake. Patient will continue oral nutrition supplements as needed to promote weight maintenance. Patient will continue bowel regimen to improve constipation. Teach back method was used.  Monitoring, evaluation, goals: Patient will continue adequate calories and protein to minimize weight loss.  Next visit: Monday, April 18, during infusion.  **Disclaimer: This note was dictated with voice recognition software. Similar sounding words can inadvertently be transcribed and this note may contain transcription errors which may not have been corrected upon publication of note.**

## 2014-09-30 NOTE — Progress Notes (Signed)
[facility] OFFICE PROGRESS NOTE   Diagnosis: Myelodysplasia, multiple myeloma  INTERVAL HISTORY:   [name] returns as scheduled. He completed another cycle of 5 azacytidine beginning 09/02/2014. He reports discomfort at the injection sites. No nausea or diarrhea. No pain following Neulasta. No bleeding or fever. He reports feeling well today.  Objective:  Vital signs in last 24 hours:  Blood pressure 108/67, pulse 86, temperature 97.4 F (36.3 C), temperature source Oral, resp. rate 18, height 6' (1.829 m), weight 0 lb (0 kg).    HEENT: No thrush or ulcers Resp: Decreased breath sounds with inspiratory rhonchi at the left greater than right base, no respiratory distress Cardio: Regular rate and rhythm [name]: No hepatosplenic the, nontender Vascular: No leg edema  Skin: No induration at the abdominal wall     Lab Results:  Lab Results  Component Value Date   WBC 3.6* 09/30/2014   HGB 13.0 09/30/2014   HCT 39.3 09/30/2014   MCV 91.8 09/30/2014   PLT 248 09/30/2014   NEUTROABS 1.0* 09/30/2014    Medications: I have reviewed the patient's current medications.  Assessment/Plan: 1. Multiple myeloma, status post treatment with melphalan/prednisone/thalidomide beginning in November 2009. The thalidomide was increased to 200 mg daily beginning 09/11/2008. The serum M spike was slightly improved on 03/18/2009 and slightly increased on 05/09/2009. He has been maintained off of specific therapy for multiple myeloma since September 2010. The serum M spike was increased on 10/20/2011 at 4.6. He began Revlimid on 11/11/2011 with weekly dexamethasone. He began cycle 2 of Revlimid on 12/09/2011. The serum M spike was improved on 12/31/2011. The serum M spike was slightly higher on 01/28/2012. He began cycle 4 on 02/03/2012. He developed recurrent hypercalcemia. Treatment was changed to Cytoxan/Velcade/Decadron beginning 03/02/2012. Cytoxan discontinued in January  2015  Treatment continued with Velcade/Decadron.   Stable serum M spike and IgG 10/04/2013.   Stable serum M spike 12/06/2013   Treatment placed on hold 01/10/2014 secondary to thrombocytopenia and a slightly higher M spike   Serum M spike and IgG increased 03/07/2014.  Cycle 1 Pomalidomide/dexamethasone 04/01/2014.  Pomalidomide subsequently placed on hold due to cytopenias.  Pomalidomide resumed at a reduced dose 05/23/2014.  Pomalidomide placed on hold 06/04/2014 due to progressive pancytopenia. 2. Chronic "dizziness."  3. Chest x-ray 11/01/2008 with a patchy opacity at the right midlung suspicious for pneumonia. He was treated with Avelox. 4. Low back pain with a radicular component. An MRI on 10/07/2008 showed no involvement of the lumbosacral spine with myeloma. 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 5. Bilateral neck pain, status post a CT 05/17/2008 with findings of spinal stenosis and osteoarthritis. 6. 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 Red cell microcytosis, likely related to hemoglobin C trait. A ferritin level returned elevated and a stool Hemoccult was negative 01/23/2010. He underwent a colonoscopy in 2009. 8. Elevated beta-2 microglobulin level. 9. History of hypertension. Blood pressure medication placed on hold December 2015 due to hypotension. 10. History of a herniated disk at L4-L5 on MRI an 01/11/2002. 11. Hyperlipidemia. 12. Gastroesophageal reflux disease. 13. History of atypical angina. 14. History of rectal bleeding-large external hemorrhoids noted 02/25/2012. The stool was Hemoccult positive. He has a history of colon polyps. He is followed by [name]. He was diagnosed with hemorrhoids in September of 2013 while hospitalized. He underwent a band procedure 03/09/2013. He has had no  further rectal bleeding. 15. Superficial venous thrombosis of the greater saphenous vein on a Doppler 09/27/2008. Negative for deep vein thrombosis. Symptoms improved with aspirin. 16. Neck pain with numbness/tingling in the arms, hands, and low back with proximal right leg weakness. An MRI of the cervical spine 05/30/2009 showed multilevel spondylosis with mild cord edema at C4-C5 and C5-C6. He was noted to have an enhancing lesion of the cord at T3-T4 of unclear etiology. In the lumbar spine there was no change in the spondylosis at L3-L4 and L4-L5. There was no involvement of the cervical or lumbar spine with myeloma. He is status post C4-C5, C5-C6, and C6-C7 anterior cervical diskectomy with fusion by Dr. Annette Stable 08/01/2009. 17. Thrombocytopenia-progressive, secondary to myelodysplasia and 5 azacytidine. 18. Indeterminate-age deep vein thrombosis of the left lower extremity on a venous Doppler 08/15/2009. There were also findings consistent with superficial thrombosis involving the right lower extremity 08/15/2009. Coumadin was discontinued in October 2011. 19. Type 2 diabetes. Now on an oral hypoglycemic 20. History of hematuria, followed at [facility]. Urinalysis was negative for blood on 07/23/2011. 21. Radicular back pain with MRI of the lumbar spine on 10/19/2011 showing nerve root impingement at L2-L3, L3-L4 and L4-L5 with the most severe at the right L3 nerve roots due to a large disc fragment. Associated right leg weakness. He underwent right L2-3 decompressive laminotomy with right L2 and L3 decompressive foraminotomy; right L2-3 microdiscectomy on 11/01/2011. 22. Constipation. He continues a laxative regimen. 23. Hospitalization 11/08/2011 through 11/10/2011 with orthostatic hypotension/dizziness. He improved with intravenous hydration. He had mild hypotension when here on 11/18/2011. We recommended discontinuing Norvasc and Proscar. 24. Hypercalcemia status post pamidronate 11/03/2011.  Recurrent hypercalcemia 02/16/2012 status post Zometa. The calcium has remained in normal range. 25. Fractured tooth with associated pain. Status post a tooth extraction by Dr. Enrique Sack, Zometa was placed on hold. 26. Exertional dyspnea. Stable. He was referred to cardiology, pulmonary function studies 06/15/2013 showed a moderately severe diffusion defect. He utilizes supplemental oxygen as needed. He is followed by pulmonary. 27. 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 28. 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   Status post cycle 1 5-azacytidine beginning 07/08/2014 4 days (day 5 held).  Cycle 2 5-azacytidine beginning 08/05/2014 5 days with Neulasta support.  Cycle 3 5-azacytidine beginning 09/02/2014 with Neulasta support.  Cycle 4 5 azacytidine beginning 09/30/2014 with Neulasta support 29. History of hypotension. Blood pressure medication placed on hold December 2015.   Disposition:  [name] appears well. He has completed 3 cycles of 5 azacytidine. There has been marked improvement in the pancytopenia. The plan is to proceed with cycle 4 today. He will return for a CBC in 2 weeks. We will repeat a serum protein electrophoresis and IgG level when he returns next month.  Betsy Coder, MD  09/30/2014  9:46 AM

## 2014-10-01 ENCOUNTER — Ambulatory Visit (HOSPITAL_BASED_OUTPATIENT_CLINIC_OR_DEPARTMENT_OTHER): Payer: Medicare HMO

## 2014-10-01 DIAGNOSIS — D469 Myelodysplastic syndrome, unspecified: Secondary | ICD-10-CM

## 2014-10-01 DIAGNOSIS — C9000 Multiple myeloma not having achieved remission: Secondary | ICD-10-CM

## 2014-10-01 MED ORDER — AZACITIDINE CHEMO SQ INJECTION
77.0000 mg/m2 | Freq: Once | INTRAMUSCULAR | Status: AC
  Administered 2014-10-01: 180 mg via SUBCUTANEOUS
  Filled 2014-10-01: qty 7.2

## 2014-10-01 MED ORDER — ONDANSETRON HCL 8 MG PO TABS
8.0000 mg | ORAL_TABLET | Freq: Once | ORAL | Status: AC
  Administered 2014-10-01: 8 mg via ORAL

## 2014-10-01 MED ORDER — ONDANSETRON HCL 8 MG PO TABS
ORAL_TABLET | ORAL | Status: AC
  Filled 2014-10-01: qty 1

## 2014-10-01 MED ORDER — AZACITIDINE CHEMO SQ INJECTION: 75.0000 mg/m2 | Freq: Once | INTRAMUSCULAR | Status: DC

## 2014-10-01 NOTE — Progress Notes (Signed)
OK to treat despite neutrophil count 1.0 per Dr Ma Rings.  Repeated & verified.  Jesse Fall Rn

## 2014-10-01 NOTE — Patient Instructions (Signed)
[facility] Discharge Instructions for Patients Receiving Chemotherapy  Today you received the following chemotherapy agents Vidaza  To help prevent nausea and vomiting after your treatment, we encourage you to take your nausea medication   If you develop nausea and vomiting that is not controlled by your nausea medication, call the clinic.   BELOW ARE SYMPTOMS THAT SHOULD BE REPORTED IMMEDIATELY:  *FEVER GREATER THAN 100.5 F  *CHILLS WITH OR WITHOUT FEVER  NAUSEA AND VOMITING THAT IS NOT CONTROLLED WITH YOUR NAUSEA MEDICATION  *UNUSUAL SHORTNESS OF BREATH  *UNUSUAL BRUISING OR BLEEDING  TENDERNESS IN MOUTH AND THROAT WITH OR WITHOUT PRESENCE OF ULCERS  *URINARY PROBLEMS  *BOWEL PROBLEMS  UNUSUAL RASH Items with * indicate a potential emergency and should be followed up as soon as possible.  Feel free to call the clinic you have any questions or concerns. The clinic phone number is [phone].  Please show the CHEMO ALERT CARD at check-in to the Emergency Department and triage nurse.   

## 2014-10-02 ENCOUNTER — Ambulatory Visit (HOSPITAL_BASED_OUTPATIENT_CLINIC_OR_DEPARTMENT_OTHER): Payer: Medicare HMO

## 2014-10-02 DIAGNOSIS — Z5111 Encounter for antineoplastic chemotherapy: Secondary | ICD-10-CM | POA: Diagnosis not present

## 2014-10-02 DIAGNOSIS — C9000 Multiple myeloma not having achieved remission: Secondary | ICD-10-CM

## 2014-10-02 DIAGNOSIS — D469 Myelodysplastic syndrome, unspecified: Secondary | ICD-10-CM | POA: Diagnosis not present

## 2014-10-02 MED ORDER — ONDANSETRON HCL 8 MG PO TABS
ORAL_TABLET | ORAL | Status: AC
Start: 2014-10-02 — End: 2014-10-02
  Filled 2014-10-02: qty 1

## 2014-10-02 MED ORDER — ONDANSETRON HCL 8 MG PO TABS
8.0000 mg | ORAL_TABLET | Freq: Once | ORAL | Status: AC
  Administered 2014-10-02: 8 mg via ORAL

## 2014-10-02 MED ORDER — AZACITIDINE CHEMO SQ INJECTION
76.0000 mg/m2 | Freq: Once | INTRAMUSCULAR | Status: AC
  Administered 2014-10-02: 180 mg via SUBCUTANEOUS
  Filled 2014-10-02: qty 7.2

## 2014-10-02 NOTE — Patient Instructions (Signed)
[facility] Discharge Instructions for Patients Receiving Chemotherapy  Today you received the following chemotherapy agents Vidaza  To help prevent nausea and vomiting after your treatment, we encourage you to take your nausea medication   If you develop nausea and vomiting that is not controlled by your nausea medication, call the clinic.   BELOW ARE SYMPTOMS THAT SHOULD BE REPORTED IMMEDIATELY:  *FEVER GREATER THAN 100.5 F  *CHILLS WITH OR WITHOUT FEVER  NAUSEA AND VOMITING THAT IS NOT CONTROLLED WITH YOUR NAUSEA MEDICATION  *UNUSUAL SHORTNESS OF BREATH  *UNUSUAL BRUISING OR BLEEDING  TENDERNESS IN MOUTH AND THROAT WITH OR WITHOUT PRESENCE OF ULCERS  *URINARY PROBLEMS  *BOWEL PROBLEMS  UNUSUAL RASH Items with * indicate a potential emergency and should be followed up as soon as possible.  Feel free to call the clinic you have any questions or concerns. The clinic phone number is [phone].  Please show the CHEMO ALERT CARD at check-in to the Emergency Department and triage nurse.   

## 2014-10-03 ENCOUNTER — Ambulatory Visit (HOSPITAL_BASED_OUTPATIENT_CLINIC_OR_DEPARTMENT_OTHER): Payer: Medicare HMO

## 2014-10-03 DIAGNOSIS — Z5111 Encounter for antineoplastic chemotherapy: Secondary | ICD-10-CM | POA: Diagnosis not present

## 2014-10-03 DIAGNOSIS — D469 Myelodysplastic syndrome, unspecified: Secondary | ICD-10-CM | POA: Diagnosis not present

## 2014-10-03 DIAGNOSIS — C9000 Multiple myeloma not having achieved remission: Secondary | ICD-10-CM

## 2014-10-03 MED ORDER — AZACITIDINE CHEMO SQ INJECTION
76.0000 mg/m2 | Freq: Once | INTRAMUSCULAR | Status: AC
  Administered 2014-10-03: 180 mg via SUBCUTANEOUS
  Filled 2014-10-03: qty 7.2

## 2014-10-03 MED ORDER — ONDANSETRON HCL 8 MG PO TABS
8.0000 mg | ORAL_TABLET | Freq: Once | ORAL | Status: AC
  Administered 2014-10-03: 8 mg via ORAL

## 2014-10-03 MED ORDER — ONDANSETRON HCL 8 MG PO TABS
ORAL_TABLET | ORAL | Status: AC
  Filled 2014-10-03: qty 1

## 2014-10-03 NOTE — Patient Instructions (Signed)
[facility] Discharge Instructions for Patients Receiving Chemotherapy  Today you received the following chemotherapy agents Vidaza  To help prevent nausea and vomiting after your treatment, we encourage you to take your nausea medication as directed/prescribed   If you develop nausea and vomiting that is not controlled by your nausea medication, call the clinic.   BELOW ARE SYMPTOMS THAT SHOULD BE REPORTED IMMEDIATELY:  *FEVER GREATER THAN 100.5 F  *CHILLS WITH OR WITHOUT FEVER  NAUSEA AND VOMITING THAT IS NOT CONTROLLED WITH YOUR NAUSEA MEDICATION  *UNUSUAL SHORTNESS OF BREATH  *UNUSUAL BRUISING OR BLEEDING  TENDERNESS IN MOUTH AND THROAT WITH OR WITHOUT PRESENCE OF ULCERS  *URINARY PROBLEMS  *BOWEL PROBLEMS  UNUSUAL RASH Items with * indicate a potential emergency and should be followed up as soon as possible.  Feel free to call the clinic you have any questions or concerns. The clinic phone number is [phone].  Please show the [facility] at check-in to the Emergency Department and triage nurse.

## 2014-10-04 ENCOUNTER — Ambulatory Visit (HOSPITAL_BASED_OUTPATIENT_CLINIC_OR_DEPARTMENT_OTHER): Payer: Medicare HMO

## 2014-10-04 DIAGNOSIS — C469 Kaposi's sarcoma, unspecified: Secondary | ICD-10-CM | POA: Diagnosis not present

## 2014-10-04 DIAGNOSIS — Z5111 Encounter for antineoplastic chemotherapy: Secondary | ICD-10-CM | POA: Diagnosis not present

## 2014-10-04 DIAGNOSIS — C9000 Multiple myeloma not having achieved remission: Secondary | ICD-10-CM

## 2014-10-04 DIAGNOSIS — D469 Myelodysplastic syndrome, unspecified: Secondary | ICD-10-CM

## 2014-10-04 MED ORDER — AZACITIDINE CHEMO SQ INJECTION
76.0000 mg/m2 | Freq: Once | INTRAMUSCULAR | Status: AC
  Administered 2014-10-04: 180 mg via SUBCUTANEOUS
  Filled 2014-10-04: qty 7.2

## 2014-10-04 MED ORDER — ONDANSETRON HCL 8 MG PO TABS
8.0000 mg | ORAL_TABLET | Freq: Once | ORAL | Status: AC
  Administered 2014-10-04: 8 mg via ORAL

## 2014-10-04 MED ORDER — ONDANSETRON HCL 8 MG PO TABS
ORAL_TABLET | ORAL | Status: AC
  Filled 2014-10-04: qty 1

## 2014-10-04 NOTE — Patient Instructions (Signed)
[facility] Discharge Instructions for Patients Receiving Chemotherapy  Today you received the following chemotherapy agents Vidaza  To help prevent nausea and vomiting after your treatment, we encourage you to take your nausea medication as needed   If you develop nausea and vomiting that is not controlled by your nausea medication, call the clinic.   BELOW ARE SYMPTOMS THAT SHOULD BE REPORTED IMMEDIATELY:  *FEVER GREATER THAN 100.5 F  *CHILLS WITH OR WITHOUT FEVER  NAUSEA AND VOMITING THAT IS NOT CONTROLLED WITH YOUR NAUSEA MEDICATION  *UNUSUAL SHORTNESS OF BREATH  *UNUSUAL BRUISING OR BLEEDING  TENDERNESS IN MOUTH AND THROAT WITH OR WITHOUT PRESENCE OF ULCERS  *URINARY PROBLEMS  *BOWEL PROBLEMS  UNUSUAL RASH Items with * indicate a potential emergency and should be followed up as soon as possible.  Feel free to call the clinic you have any questions or concerns. The clinic phone number is [phone].  Please show the [facility] at check-in to the Emergency Department and triage nurse.

## 2014-10-05 ENCOUNTER — Ambulatory Visit (HOSPITAL_BASED_OUTPATIENT_CLINIC_OR_DEPARTMENT_OTHER): Payer: Medicare HMO

## 2014-10-05 DIAGNOSIS — C9000 Multiple myeloma not having achieved remission: Secondary | ICD-10-CM | POA: Diagnosis not present

## 2014-10-05 DIAGNOSIS — Z5189 Encounter for other specified aftercare: Secondary | ICD-10-CM

## 2014-10-05 DIAGNOSIS — D61810 Antineoplastic chemotherapy induced pancytopenia: Secondary | ICD-10-CM

## 2014-10-05 DIAGNOSIS — D469 Myelodysplastic syndrome, unspecified: Secondary | ICD-10-CM

## 2014-10-05 MED ORDER — PEGFILGRASTIM INJECTION 6 MG/0.6ML
6.0000 mg | Freq: Once | SUBCUTANEOUS | Status: AC
Start: 2014-10-05 — End: 2014-10-05
  Administered 2014-10-05: 6 mg via SUBCUTANEOUS

## 2014-10-05 NOTE — Patient Instructions (Signed)
Pegfilgrastim injection What is this medicine? PEGFILGRASTIM (peg fil GRA stim) is a long-acting granulocyte colony-stimulating factor that stimulates the growth of neutrophils, a type of white blood cell important in the body's fight against infection. It is used to reduce the incidence of fever and infection in patients with certain types of cancer who are receiving chemotherapy that affects the bone marrow. This medicine may be used for other purposes; ask your health care provider or pharmacist if you have questions. COMMON BRAND NAME(S): Neulasta What should I tell my health care provider before I take this medicine? They need to know if you have any of these conditions: -latex allergy -ongoing radiation therapy -sickle cell disease -skin reactions to acrylic adhesives (On-Body Injector only) -an unusual or allergic reaction to pegfilgrastim, filgrastim, other medicines, foods, dyes, or preservatives -pregnant or trying to get pregnant -breast-feeding How should I use this medicine? This medicine is for injection under the skin. If you get this medicine at home, you will be taught how to prepare and give the pre-filled syringe or how to use the On-body Injector. Refer to the patient Instructions for Use for detailed instructions. Use exactly as directed. Take your medicine at regular intervals. Do not take your medicine more often than directed. It is important that you put your used needles and syringes in a special sharps container. Do not put them in a trash can. If you do not have a sharps container, call your pharmacist or healthcare provider to get one. Talk to your pediatrician regarding the use of this medicine in children. Special care may be needed. Overdosage: If you think you have taken too much of this medicine contact a poison control center or emergency room at once. NOTE: This medicine is only for you. Do not share this medicine with others. What if I miss a dose? It is  important not to miss your dose. Call your doctor or health care professional if you miss your dose. If you miss a dose due to an On-body Injector failure or leakage, a new dose should be administered as soon as possible using a single prefilled syringe for manual use. What may interact with this medicine? Interactions have not been studied. Give your health care provider a list of all the medicines, herbs, non-prescription drugs, or dietary supplements you use. Also tell them if you smoke, drink alcohol, or use illegal drugs. Some items may interact with your medicine. This list may not describe all possible interactions. Give your health care provider a list of all the medicines, herbs, non-prescription drugs, or dietary supplements you use. Also tell them if you smoke, drink alcohol, or use illegal drugs. Some items may interact with your medicine. What should I watch for while using this medicine? You may need blood work done while you are taking this medicine. If you are going to need a MRI, CT scan, or other procedure, tell your doctor that you are using this medicine (On-Body Injector only). What side effects may I notice from receiving this medicine? Side effects that you should report to your doctor or health care professional as soon as possible: -allergic reactions like skin rash, itching or hives, swelling of the face, lips, or tongue -dizziness -fever -pain, redness, or irritation at site where injected -pinpoint red spots on the skin -shortness of breath or breathing problems -stomach or side pain, or pain at the shoulder -swelling -tiredness -trouble passing urine Side effects that usually do not require medical attention (report to your doctor   or health care professional if they continue or are bothersome): -bone pain -muscle pain This list may not describe all possible side effects. Call your doctor for medical advice about side effects. You may report side effects to FDA at  1-800-FDA-1088. Where should I keep my medicine? Keep out of the reach of children. Store pre-filled syringes in a refrigerator between 2 and 8 degrees C (36 and 46 degrees F). Do not freeze. Keep in carton to protect from light. Throw away this medicine if it is left out of the refrigerator for more than 48 hours. Throw away any unused medicine after the expiration date. NOTE: This sheet is a summary. It may not cover all possible information. If you have questions about this medicine, talk to your doctor, pharmacist, or health care provider.  2015, [employer]vier/[employer] Standard. (2013-09-27 16:14:05)  

## 2014-10-16 ENCOUNTER — Other Ambulatory Visit: Payer: Self-pay | Admitting: Internal Medicine

## 2014-10-18 NOTE — Telephone Encounter (Signed)
Please advise if okay to refill. 

## 2014-10-20 NOTE — Telephone Encounter (Signed)
Ok for 6 months 

## 2014-10-27 ENCOUNTER — Other Ambulatory Visit: Payer: Self-pay | Admitting: Oncology

## 2014-10-28 ENCOUNTER — Other Ambulatory Visit (HOSPITAL_BASED_OUTPATIENT_CLINIC_OR_DEPARTMENT_OTHER): Payer: Medicare HMO

## 2014-10-28 ENCOUNTER — Ambulatory Visit (HOSPITAL_BASED_OUTPATIENT_CLINIC_OR_DEPARTMENT_OTHER): Payer: Medicare HMO

## 2014-10-28 ENCOUNTER — Ambulatory Visit (HOSPITAL_BASED_OUTPATIENT_CLINIC_OR_DEPARTMENT_OTHER): Payer: Medicare HMO | Admitting: Nurse Practitioner

## 2014-10-28 ENCOUNTER — Telehealth: Payer: Self-pay | Admitting: Oncology

## 2014-10-28 ENCOUNTER — Ambulatory Visit: Payer: Medicare HMO | Admitting: Nutrition

## 2014-10-28 VITALS — BP 101/66 | HR 87 | Temp 97.6°F | Resp 18 | Ht 72.0 in | Wt 237.9 lb

## 2014-10-28 DIAGNOSIS — M25561 Pain in right knee: Secondary | ICD-10-CM

## 2014-10-28 DIAGNOSIS — Z5111 Encounter for antineoplastic chemotherapy: Secondary | ICD-10-CM | POA: Diagnosis not present

## 2014-10-28 DIAGNOSIS — D469 Myelodysplastic syndrome, unspecified: Secondary | ICD-10-CM | POA: Diagnosis not present

## 2014-10-28 DIAGNOSIS — C9000 Multiple myeloma not having achieved remission: Secondary | ICD-10-CM

## 2014-10-28 DIAGNOSIS — Z86718 Personal history of other venous thrombosis and embolism: Secondary | ICD-10-CM

## 2014-10-28 DIAGNOSIS — M25519 Pain in unspecified shoulder: Secondary | ICD-10-CM

## 2014-10-28 DIAGNOSIS — E119 Type 2 diabetes mellitus without complications: Secondary | ICD-10-CM

## 2014-10-28 DIAGNOSIS — D6481 Anemia due to antineoplastic chemotherapy: Secondary | ICD-10-CM

## 2014-10-28 DIAGNOSIS — D630 Anemia in neoplastic disease: Secondary | ICD-10-CM | POA: Diagnosis not present

## 2014-10-28 DIAGNOSIS — M25562 Pain in left knee: Secondary | ICD-10-CM

## 2014-10-28 DIAGNOSIS — I10 Essential (primary) hypertension: Secondary | ICD-10-CM

## 2014-10-28 DIAGNOSIS — D61818 Other pancytopenia: Secondary | ICD-10-CM

## 2014-10-28 DIAGNOSIS — K5900 Constipation, unspecified: Secondary | ICD-10-CM

## 2014-10-28 LAB — CBC WITH DIFFERENTIAL/PLATELET
BASO%: 1.6 % (ref 0.0–2.0)
Basophils Absolute: 0.1 10*3/uL (ref 0.0–0.1)
EOS%: 4.1 % (ref 0.0–7.0)
Eosinophils Absolute: 0.1 10*3/uL (ref 0.0–0.5)
HCT: 39.4 % (ref 38.4–49.9)
HGB: 13.1 g/dL (ref 13.0–17.1)
LYMPH%: 51.5 % — ABNORMAL HIGH (ref 14.0–49.0)
MCH: 29.3 pg (ref 27.2–33.4)
MCHC: 33.3 g/dL (ref 32.0–36.0)
MCV: 87.9 fL (ref 79.3–98.0)
MONO#: 0.3 10*3/uL (ref 0.1–0.9)
MONO%: 7 % (ref 0.0–14.0)
NEUT#: 1.3 10*3/uL — ABNORMAL LOW (ref 1.5–6.5)
NEUT%: 35.8 % — ABNORMAL LOW (ref 39.0–75.0)
Platelets: 243 10*3/uL (ref 140–400)
RBC: 4.49 10*6/uL (ref 4.20–5.82)
RDW: 21.6 % — AB (ref 11.0–14.6)
WBC: 3.6 10*3/uL — ABNORMAL LOW (ref 4.0–10.3)
lymph#: 1.9 10*3/uL (ref 0.9–3.3)

## 2014-10-28 LAB — COMPREHENSIVE METABOLIC PANEL (CC13)
ALK PHOS: 48 U/L (ref 40–150)
ALT: 9 U/L (ref 0–55)
AST: 18 U/L (ref 5–34)
Albumin: 3.1 g/dL — ABNORMAL LOW (ref 3.5–5.0)
Anion Gap: 8 mEq/L (ref 3–11)
BILIRUBIN TOTAL: 0.38 mg/dL (ref 0.20–1.20)
BUN: 12.4 mg/dL (ref 7.0–26.0)
CO2: 21 mEq/L — ABNORMAL LOW (ref 22–29)
Calcium: 9.3 mg/dL (ref 8.4–10.4)
Chloride: 107 mEq/L (ref 98–109)
Creatinine: 1.1 mg/dL (ref 0.7–1.3)
EGFR: 79 mL/min/{1.73_m2} — AB (ref >90)
GLUCOSE: 96 mg/dL (ref 70–140)
Potassium: 4.1 mEq/L (ref 3.5–5.1)
Sodium: 136 mEq/L (ref 136–145)
TOTAL PROTEIN: 8.9 g/dL — AB (ref 6.4–8.3)

## 2014-10-28 MED ORDER — ONDANSETRON HCL 8 MG PO TABS
ORAL_TABLET | ORAL | Status: AC
  Filled 2014-10-28: qty 1

## 2014-10-28 MED ORDER — ONDANSETRON HCL 8 MG PO TABS
8.0000 mg | ORAL_TABLET | Freq: Once | ORAL | Status: AC
  Administered 2014-10-28: 8 mg via ORAL

## 2014-10-28 MED ORDER — AZACITIDINE CHEMO SQ INJECTION
76.0000 mg/m2 | Freq: Once | INTRAMUSCULAR | Status: AC
  Administered 2014-10-28: 180 mg via SUBCUTANEOUS
  Filled 2014-10-28: qty 7.2

## 2014-10-28 NOTE — Progress Notes (Signed)
Nutrition follow-up completed with patient and wife in infusion.  Patient is being treated for multiple myeloma. Oral intake and appetite continue to be improved. Weight relatively stable and documented as 237 pounds. Patient states he is out nutrition supplements and is requesting additional Ensure Plus. Patient has no other nutrition impact symptoms.  Nutrition diagnosis: Unintended weight loss resolved.  Recommended patient continue oral nutrition supplements for weight maintenance. Provided third and final case of Ensure Plus. Questions answered.  Teach back method used. Patient has my contact information for questions or concerns.  **Disclaimer: This note was dictated with voice recognition software. Similar sounding words can inadvertently be transcribed and this note may contain transcription errors which may not have been corrected upon publication of note.**

## 2014-10-28 NOTE — Progress Notes (Signed)
[facility] OFFICE PROGRESS NOTE   Diagnosis:  Myelodysplasia, multiple myeloma  INTERVAL HISTORY:   [name] returns as scheduled. He completed cycle 4 5-azacytidine beginning 09/30/2014. He denies nausea/vomiting. No mouth sores. No diarrhea. He continues a laxative for constipation. He reports a good appetite. Energy level remains poor. No fevers. No bleeding. He continues to have bilateral knee and shoulder pain which he attributes to arthritis.  Objective:  Vital signs in last 24 hours:  Blood pressure 101/66, pulse 87, temperature 97.6 F (36.4 C), temperature source Oral, resp. rate 18, height 6' (1.829 m), weight 237 lb 14.4 oz (107.911 kg), SpO2 96 %.    HEENT: No thrush or ulcers. Resp: Lungs clear bilaterally. Cardio: Regular rate and rhythm. GI: Abdomen soft and nontender. No organomegaly. Vascular: No leg edema.   Lab Results:  Lab Results  Component Value Date   WBC 3.6* 10/28/2014   HGB 13.1 10/28/2014   HCT 39.4 10/28/2014   MCV 87.9 10/28/2014   PLT 243 10/28/2014   NEUTROABS 1.3* 10/28/2014    Imaging:  No results found.  Medications: I have reviewed the patient's current medications.  Assessment/Plan: 1. Multiple myeloma, status post treatment with melphalan/prednisone/thalidomide beginning in November 2009. The thalidomide was increased to 200 mg daily beginning 09/11/2008. The serum M spike was slightly improved on 03/18/2009 and slightly increased on 05/09/2009. He has been maintained off of specific therapy for multiple myeloma since September 2010. The serum M spike was increased on 10/20/2011 at 4.6. He began Revlimid on 11/11/2011 with weekly dexamethasone. He began cycle 2 of Revlimid on 12/09/2011. The serum M spike was improved on 12/31/2011. The serum M spike was slightly higher on 01/28/2012. He began cycle 4 on 02/03/2012. He developed recurrent hypercalcemia. Treatment was changed to Cytoxan/Velcade/Decadron beginning  03/02/2012. Cytoxan discontinued in January 2015  Treatment continued with Velcade/Decadron.   Stable serum M spike and IgG 10/04/2013.   Stable serum M spike 12/06/2013   Treatment placed on hold 01/10/2014 secondary to thrombocytopenia and a slightly higher M spike   Serum M spike and IgG increased 03/07/2014.  Cycle 1 Pomalidomide/dexamethasone 04/01/2014.  Pomalidomide subsequently placed on hold due to cytopenias.  Pomalidomide resumed at a reduced dose 05/23/2014.  Pomalidomide placed on hold 06/04/2014 due to progressive pancytopenia. 2. Chronic "dizziness."  3. Chest x-ray 11/01/2008 with a patchy opacity at the right midlung suspicious for pneumonia. He was treated with Avelox. 4. Low back pain with a radicular component. An MRI on 10/07/2008 showed no involvement of the lumbosacral spine with myeloma. 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 5. Bilateral neck pain, status post a CT 05/17/2008 with findings of spinal stenosis and osteoarthritis. 6. 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 Red cell microcytosis, likely related to hemoglobin C trait. A ferritin level returned elevated and a stool Hemoccult was negative 01/23/2010. He underwent a colonoscopy in 2009. 8. Elevated beta-2 microglobulin level. 9. History of hypertension. Blood pressure medication placed on hold December 2015 due to hypotension. 10. History of a herniated disk at L4-L5 on MRI an 01/11/2002. 11. Hyperlipidemia. 12. Gastroesophageal reflux disease. 13. History of atypical angina. 14. History of rectal bleeding-large external hemorrhoids noted 02/25/2012. The stool was Hemoccult positive. He has a history of colon polyps. He is followed by  GI. He was diagnosed with hemorrhoids in September of 2013 while hospitalized. He underwent a  band procedure 03/09/2013. He has had no further rectal bleeding. 15. Superficial venous thrombosis of the greater saphenous vein on a Doppler 09/27/2008. Negative for deep vein thrombosis. Symptoms improved with aspirin. 16. Neck pain with numbness/tingling in the arms, hands, and low back with proximal right leg weakness. An MRI of the cervical spine 05/30/2009 showed multilevel spondylosis with mild cord edema at C4-C5 and C5-C6. He was noted to have an enhancing lesion of the cord at T3-T4 of unclear etiology. In the lumbar spine there was no change in the spondylosis at L3-L4 and L4-L5. There was no involvement of the cervical or lumbar spine with myeloma. He is status post C4-C5, C5-C6, and C6-C7 anterior cervical diskectomy with fusion by Dr. Annette Stable 08/01/2009. 17. Thrombocytopenia-progressive, secondary to myelodysplasia and 5 azacytidine. 18. Indeterminate-age deep vein thrombosis of the left lower extremity on a venous Doppler 08/15/2009. There were also findings consistent with superficial thrombosis involving the right lower extremity 08/15/2009. Coumadin was discontinued in October 2011. 19. Type 2 diabetes. Now on an oral hypoglycemic 20. History of hematuria, followed at [facility]. Urinalysis was negative for blood on 07/23/2011. 21. Radicular back pain with MRI of the lumbar spine on 10/19/2011 showing nerve root impingement at L2-L3, L3-L4 and L4-L5 with the most severe at the right L3 nerve roots due to a large disc fragment. Associated right leg weakness. He underwent right L2-3 decompressive laminotomy with right L2 and L3 decompressive foraminotomy; right L2-3 microdiscectomy on 11/01/2011. 22. Constipation. He continues a laxative regimen. 23. Hospitalization 11/08/2011 through 11/10/2011 with orthostatic hypotension/dizziness. He improved with intravenous hydration. He had mild hypotension when here on 11/18/2011. We recommended discontinuing Norvasc and  Proscar. 24. Hypercalcemia status post pamidronate 11/03/2011. Recurrent hypercalcemia 02/16/2012 status post Zometa. The calcium has remained in normal range. 25. Fractured tooth with associated pain. Status post a tooth extraction by Dr. Enrique Sack, Zometa was placed on hold. 26. Exertional dyspnea. Stable. He was referred to cardiology, pulmonary function studies 06/15/2013 showed a moderately severe diffusion defect. He utilizes supplemental oxygen as needed. He is followed by pulmonary. 27. 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 28. 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   Status post cycle 1 5-azacytidine beginning 07/08/2014 4 days (day 5 held).  Cycle 2 5-azacytidine beginning 08/05/2014 5 days with Neulasta support.  Cycle 3 5-azacytidine beginning 09/02/2014 with Neulasta support.  Cycle 4 5 azacytidine beginning 09/30/2014 with Neulasta support  Cycle 5 5-azacytidine beginning 10/28/2014 with Neulasta support 29. History of hypotension. Blood pressure medication placed on hold December 2015.   Disposition: [name] appears stable. He has completed 4 cycles of 5-azacytidine. Pancytopenia continues to be markedly improved. Plan to proceed with cycle 5 today as scheduled.  We will follow-up on the serum protein electrophoresis and IgG level from today.  He will return for a follow-up visit and cycle 6 5-azacytidine in 4 weeks. He understands to contact the office in the interim with any problems.  Plan reviewed with Dr. Benay Spice.  Ned Card ANP/GNP-BC   10/28/2014  11:41 AM

## 2014-10-28 NOTE — Telephone Encounter (Signed)
gave and printed appt sched and avs for pt for April and May ....sed added tx. °

## 2014-10-28 NOTE — Patient Instructions (Signed)
[facility] Discharge Instructions for Patients Receiving Chemotherapy  Today you received the following chemotherapy agents Vidaza.  To help prevent nausea and vomiting after your treatment, we encourage you to take your nausea medication Compazine 10 mg every 6 hours as needed.  If you develop nausea and vomiting that is not controlled by your nausea medication, call the clinic.   BELOW ARE SYMPTOMS THAT SHOULD BE REPORTED IMMEDIATELY:  *FEVER GREATER THAN 100.5 F  *CHILLS WITH OR WITHOUT FEVER  NAUSEA AND VOMITING THAT IS NOT CONTROLLED WITH YOUR NAUSEA MEDICATION  *UNUSUAL SHORTNESS OF BREATH  *UNUSUAL BRUISING OR BLEEDING  TENDERNESS IN MOUTH AND THROAT WITH OR WITHOUT PRESENCE OF ULCERS  *URINARY PROBLEMS  *BOWEL PROBLEMS  UNUSUAL RASH Items with * indicate a potential emergency and should be followed up as soon as possible.  Feel free to call the clinic you have any questions or concerns. The clinic phone number is [phone].  Please show the [facility] at check-in to the Emergency Department and triage nurse.

## 2014-10-29 ENCOUNTER — Ambulatory Visit (HOSPITAL_BASED_OUTPATIENT_CLINIC_OR_DEPARTMENT_OTHER): Payer: Medicare HMO

## 2014-10-29 VITALS — BP 115/74 | HR 91 | Temp 98.1°F

## 2014-10-29 DIAGNOSIS — D469 Myelodysplastic syndrome, unspecified: Secondary | ICD-10-CM | POA: Diagnosis not present

## 2014-10-29 DIAGNOSIS — C9000 Multiple myeloma not having achieved remission: Secondary | ICD-10-CM

## 2014-10-29 MED ORDER — ONDANSETRON HCL 8 MG PO TABS
8.0000 mg | ORAL_TABLET | Freq: Once | ORAL | Status: AC
  Administered 2014-10-29: 8 mg via ORAL

## 2014-10-29 MED ORDER — ONDANSETRON HCL 8 MG PO TABS
ORAL_TABLET | ORAL | Status: AC
  Filled 2014-10-29: qty 1

## 2014-10-29 MED ORDER — AZACITIDINE CHEMO SQ INJECTION
76.0000 mg/m2 | Freq: Once | INTRAMUSCULAR | Status: AC
  Administered 2014-10-29: 180 mg via SUBCUTANEOUS
  Filled 2014-10-29: qty 7.2

## 2014-10-29 NOTE — Patient Instructions (Signed)
[facility] Discharge Instructions for Patients Receiving Chemotherapy  Today you received the following chemotherapy agents vidaza   To help prevent nausea and vomiting after your treatment, we encourage you to take your nausea medication as directed. If you develop nausea and vomiting that is not controlled by your nausea medication, call the clinic.   BELOW ARE SYMPTOMS THAT SHOULD BE REPORTED IMMEDIATELY:  *FEVER GREATER THAN 100.5 F  *CHILLS WITH OR WITHOUT FEVER  NAUSEA AND VOMITING THAT IS NOT CONTROLLED WITH YOUR NAUSEA MEDICATION  *UNUSUAL SHORTNESS OF BREATH  *UNUSUAL BRUISING OR BLEEDING  TENDERNESS IN MOUTH AND THROAT WITH OR WITHOUT PRESENCE OF ULCERS  *URINARY PROBLEMS  *BOWEL PROBLEMS  UNUSUAL RASH Items with * indicate a potential emergency and should be followed up as soon as possible.  Feel free to call the clinic you have any questions or concerns. The clinic phone number is [phone].  

## 2014-10-30 ENCOUNTER — Ambulatory Visit (HOSPITAL_BASED_OUTPATIENT_CLINIC_OR_DEPARTMENT_OTHER): Payer: Medicare HMO

## 2014-10-30 ENCOUNTER — Other Ambulatory Visit: Payer: Self-pay | Admitting: *Deleted

## 2014-10-30 VITALS — BP 103/61 | HR 100 | Temp 97.7°F | Resp 18

## 2014-10-30 DIAGNOSIS — Z5111 Encounter for antineoplastic chemotherapy: Secondary | ICD-10-CM | POA: Diagnosis not present

## 2014-10-30 DIAGNOSIS — D469 Myelodysplastic syndrome, unspecified: Secondary | ICD-10-CM

## 2014-10-30 DIAGNOSIS — C9000 Multiple myeloma not having achieved remission: Secondary | ICD-10-CM

## 2014-10-30 LAB — PROTEIN ELECTROPHORESIS, SERUM
ABNORMAL PROTEIN BAND1: 3.1 g/dL
ALPHA-1-GLOBULIN: 0.3 g/dL (ref 0.2–0.3)
ALPHA-2-GLOBULIN: 0.6 g/dL (ref 0.5–0.9)
Albumin ELP: 3.9 g/dL (ref 3.8–4.8)
BETA 2: 0.3 g/dL (ref 0.2–0.5)
Beta Globulin: 0.3 g/dL — ABNORMAL LOW (ref 0.4–0.6)
GAMMA GLOBULIN: 3.5 g/dL — AB (ref 0.8–1.7)
Total Protein, Serum Electrophoresis: 8.8 g/dL — ABNORMAL HIGH (ref 6.1–8.1)

## 2014-10-30 LAB — IGG: IGG (IMMUNOGLOBIN G), SERUM: 3810 mg/dL — AB (ref 650–1600)

## 2014-10-30 MED ORDER — AZACITIDINE CHEMO SQ INJECTION
76.0000 mg/m2 | Freq: Once | INTRAMUSCULAR | Status: AC
  Administered 2014-10-30: 180 mg via SUBCUTANEOUS
  Filled 2014-10-30: qty 7.2

## 2014-10-30 MED ORDER — ONDANSETRON HCL 8 MG PO TABS
8.0000 mg | ORAL_TABLET | Freq: Once | ORAL | Status: AC
  Administered 2014-10-30: 8 mg via ORAL

## 2014-10-30 MED ORDER — ONDANSETRON HCL 8 MG PO TABS
ORAL_TABLET | ORAL | Status: AC
  Filled 2014-10-30: qty 1

## 2014-10-30 NOTE — Patient Instructions (Signed)
[facility] Discharge Instructions for Patients Receiving Chemotherapy  Today you received the following chemotherapy agents: Vidaza   To help prevent nausea and vomiting after your treatment, we encourage you to take your nausea medication as directed.    If you develop nausea and vomiting that is not controlled by your nausea medication, call the clinic.   BELOW ARE SYMPTOMS THAT SHOULD BE REPORTED IMMEDIATELY:  *FEVER GREATER THAN 100.5 F  *CHILLS WITH OR WITHOUT FEVER  NAUSEA AND VOMITING THAT IS NOT CONTROLLED WITH YOUR NAUSEA MEDICATION  *UNUSUAL SHORTNESS OF BREATH  *UNUSUAL BRUISING OR BLEEDING  TENDERNESS IN MOUTH AND THROAT WITH OR WITHOUT PRESENCE OF ULCERS  *URINARY PROBLEMS  *BOWEL PROBLEMS  UNUSUAL RASH Items with * indicate a potential emergency and should be followed up as soon as possible.  Feel free to call the clinic you have any questions or concerns. The clinic phone number is [phone].  Please show the CHEMO ALERT CARD at check-in to the Emergency Department and triage nurse.   

## 2014-10-31 ENCOUNTER — Ambulatory Visit (HOSPITAL_BASED_OUTPATIENT_CLINIC_OR_DEPARTMENT_OTHER): Payer: Medicare HMO

## 2014-10-31 VITALS — BP 126/76 | HR 94 | Temp 97.8°F | Resp 19

## 2014-10-31 DIAGNOSIS — D469 Myelodysplastic syndrome, unspecified: Secondary | ICD-10-CM | POA: Diagnosis not present

## 2014-10-31 DIAGNOSIS — Z5111 Encounter for antineoplastic chemotherapy: Secondary | ICD-10-CM | POA: Diagnosis not present

## 2014-10-31 DIAGNOSIS — C9000 Multiple myeloma not having achieved remission: Secondary | ICD-10-CM

## 2014-10-31 MED ORDER — ONDANSETRON HCL 8 MG PO TABS
ORAL_TABLET | ORAL | Status: AC
  Filled 2014-10-31: qty 1

## 2014-10-31 MED ORDER — AZACITIDINE CHEMO SQ INJECTION
76.0000 mg/m2 | Freq: Once | INTRAMUSCULAR | Status: AC
  Administered 2014-10-31: 180 mg via SUBCUTANEOUS
  Filled 2014-10-31: qty 7.2

## 2014-10-31 MED ORDER — ONDANSETRON HCL 8 MG PO TABS
8.0000 mg | ORAL_TABLET | Freq: Once | ORAL | Status: AC
  Administered 2014-10-31: 8 mg via ORAL

## 2014-10-31 NOTE — Patient Instructions (Signed)
[facility] Discharge Instructions for Patients Receiving Chemotherapy  Today you received the following chemotherapy agents: Vidaza  To help prevent nausea and vomiting after your treatment, we encourage you to take your nausea medication as prescribed by your physician.   If you develop nausea and vomiting that is not controlled by your nausea medication, call the clinic.   BELOW ARE SYMPTOMS THAT SHOULD BE REPORTED IMMEDIATELY:  *FEVER GREATER THAN 100.5 F  *CHILLS WITH OR WITHOUT FEVER  NAUSEA AND VOMITING THAT IS NOT CONTROLLED WITH YOUR NAUSEA MEDICATION  *UNUSUAL SHORTNESS OF BREATH  *UNUSUAL BRUISING OR BLEEDING  TENDERNESS IN MOUTH AND THROAT WITH OR WITHOUT PRESENCE OF ULCERS  *URINARY PROBLEMS  *BOWEL PROBLEMS  UNUSUAL RASH Items with * indicate a potential emergency and should be followed up as soon as possible.  Feel free to call the clinic you have any questions or concerns. The clinic phone number is [phone].  Please show the [facility] at check-in to the Emergency Department and triage nurse.

## 2014-11-01 ENCOUNTER — Ambulatory Visit (HOSPITAL_BASED_OUTPATIENT_CLINIC_OR_DEPARTMENT_OTHER): Payer: Medicare HMO

## 2014-11-01 ENCOUNTER — Ambulatory Visit: Payer: Self-pay

## 2014-11-01 VITALS — BP 126/78 | HR 98 | Temp 97.8°F

## 2014-11-01 DIAGNOSIS — D469 Myelodysplastic syndrome, unspecified: Secondary | ICD-10-CM

## 2014-11-01 DIAGNOSIS — Z5111 Encounter for antineoplastic chemotherapy: Secondary | ICD-10-CM

## 2014-11-01 DIAGNOSIS — C9000 Multiple myeloma not having achieved remission: Secondary | ICD-10-CM

## 2014-11-01 MED ORDER — ONDANSETRON HCL 8 MG PO TABS
ORAL_TABLET | ORAL | Status: AC
Start: 2014-11-01 — End: 2014-11-01
  Filled 2014-11-01: qty 1

## 2014-11-01 MED ORDER — ONDANSETRON HCL 8 MG PO TABS
8.0000 mg | ORAL_TABLET | Freq: Once | ORAL | Status: AC
  Administered 2014-11-01: 8 mg via ORAL

## 2014-11-01 MED ORDER — AZACITIDINE CHEMO SQ INJECTION
76.0000 mg/m2 | Freq: Once | INTRAMUSCULAR | Status: AC
  Administered 2014-11-01: 180 mg via SUBCUTANEOUS
  Filled 2014-11-01: qty 7.2

## 2014-11-01 NOTE — Patient Instructions (Signed)
[facility] Discharge Instructions for Patients Receiving Chemotherapy  Today you received the following chemotherapy agents vidaza   To help prevent nausea and vomiting after your treatment, we encourage you to take your nausea medication as directed. If you develop nausea and vomiting that is not controlled by your nausea medication, call the clinic.   BELOW ARE SYMPTOMS THAT SHOULD BE REPORTED IMMEDIATELY:  *FEVER GREATER THAN 100.5 F  *CHILLS WITH OR WITHOUT FEVER  NAUSEA AND VOMITING THAT IS NOT CONTROLLED WITH YOUR NAUSEA MEDICATION  *UNUSUAL SHORTNESS OF BREATH  *UNUSUAL BRUISING OR BLEEDING  TENDERNESS IN MOUTH AND THROAT WITH OR WITHOUT PRESENCE OF ULCERS  *URINARY PROBLEMS  *BOWEL PROBLEMS  UNUSUAL RASH Items with * indicate a potential emergency and should be followed up as soon as possible.  Feel free to call the clinic you have any questions or concerns. The clinic phone number is [phone].  

## 2014-11-02 ENCOUNTER — Ambulatory Visit (HOSPITAL_BASED_OUTPATIENT_CLINIC_OR_DEPARTMENT_OTHER): Payer: Medicare HMO

## 2014-11-02 VITALS — BP 110/78 | HR 105 | Temp 97.8°F | Resp 18

## 2014-11-02 DIAGNOSIS — D469 Myelodysplastic syndrome, unspecified: Secondary | ICD-10-CM

## 2014-11-02 DIAGNOSIS — Z5189 Encounter for other specified aftercare: Secondary | ICD-10-CM

## 2014-11-02 DIAGNOSIS — C9000 Multiple myeloma not having achieved remission: Secondary | ICD-10-CM | POA: Diagnosis not present

## 2014-11-02 DIAGNOSIS — D61810 Antineoplastic chemotherapy induced pancytopenia: Secondary | ICD-10-CM

## 2014-11-02 MED ORDER — PEGFILGRASTIM INJECTION 6 MG/0.6ML
6.0000 mg | Freq: Once | SUBCUTANEOUS | Status: AC
  Administered 2014-11-02: 6 mg via SUBCUTANEOUS

## 2014-11-02 NOTE — Patient Instructions (Signed)
Pegfilgrastim injection What is this medicine? PEGFILGRASTIM (peg fil GRA stim) is a long-acting granulocyte colony-stimulating factor that stimulates the growth of neutrophils, a type of white blood cell important in the body's fight against infection. It is used to reduce the incidence of fever and infection in patients with certain types of cancer who are receiving chemotherapy that affects the bone marrow. This medicine may be used for other purposes; ask your health care provider or pharmacist if you have questions. COMMON BRAND NAME(S): Neulasta What should I tell my health care provider before I take this medicine? They need to know if you have any of these conditions: -latex allergy -ongoing radiation therapy -sickle cell disease -skin reactions to acrylic adhesives (On-Body Injector only) -an unusual or allergic reaction to pegfilgrastim, filgrastim, other medicines, foods, dyes, or preservatives -pregnant or trying to get pregnant -breast-feeding How should I use this medicine? This medicine is for injection under the skin. If you get this medicine at home, you will be taught how to prepare and give the pre-filled syringe or how to use the On-body Injector. Refer to the patient Instructions for Use for detailed instructions. Use exactly as directed. Take your medicine at regular intervals. Do not take your medicine more often than directed. It is important that you put your used needles and syringes in a special sharps container. Do not put them in a trash can. If you do not have a sharps container, call your pharmacist or healthcare provider to get one. Talk to your pediatrician regarding the use of this medicine in children. Special care may be needed. Overdosage: If you think you have taken too much of this medicine contact a poison control center or emergency room at once. NOTE: This medicine is only for you. Do not share this medicine with others. What if I miss a dose? It is  important not to miss your dose. Call your doctor or health care professional if you miss your dose. If you miss a dose due to an On-body Injector failure or leakage, a new dose should be administered as soon as possible using a single prefilled syringe for manual use. What may interact with this medicine? Interactions have not been studied. Give your health care provider a list of all the medicines, herbs, non-prescription drugs, or dietary supplements you use. Also tell them if you smoke, drink alcohol, or use illegal drugs. Some items may interact with your medicine. This list may not describe all possible interactions. Give your health care provider a list of all the medicines, herbs, non-prescription drugs, or dietary supplements you use. Also tell them if you smoke, drink alcohol, or use illegal drugs. Some items may interact with your medicine. What should I watch for while using this medicine? You may need blood work done while you are taking this medicine. If you are going to need a MRI, CT scan, or other procedure, tell your doctor that you are using this medicine (On-Body Injector only). What side effects may I notice from receiving this medicine? Side effects that you should report to your doctor or health care professional as soon as possible: -allergic reactions like skin rash, itching or hives, swelling of the face, lips, or tongue -dizziness -fever -pain, redness, or irritation at site where injected -pinpoint red spots on the skin -shortness of breath or breathing problems -stomach or side pain, or pain at the shoulder -swelling -tiredness -trouble passing urine Side effects that usually do not require medical attention (report to your doctor   or health care professional if they continue or are bothersome): -bone pain -muscle pain This list may not describe all possible side effects. Call your doctor for medical advice about side effects. You may report side effects to FDA at  1-800-FDA-1088. Where should I keep my medicine? Keep out of the reach of children. Store pre-filled syringes in a refrigerator between 2 and 8 degrees C (36 and 46 degrees F). Do not freeze. Keep in carton to protect from light. Throw away this medicine if it is left out of the refrigerator for more than 48 hours. Throw away any unused medicine after the expiration date. NOTE: This sheet is a summary. It may not cover all possible information. If you have questions about this medicine, talk to your doctor, pharmacist, or health care provider.  2015, [employer]vier/[employer] Standard. (2013-09-27 16:14:05)  

## 2014-11-20 ENCOUNTER — Other Ambulatory Visit: Payer: Self-pay | Admitting: Internal Medicine

## 2014-11-20 DIAGNOSIS — G4733 Obstructive sleep apnea (adult) (pediatric): Secondary | ICD-10-CM

## 2014-11-21 ENCOUNTER — Ambulatory Visit (INDEPENDENT_AMBULATORY_CARE_PROVIDER_SITE_OTHER): Payer: Medicare HMO | Admitting: Internal Medicine

## 2014-11-21 ENCOUNTER — Encounter: Payer: Self-pay | Admitting: Internal Medicine

## 2014-11-21 ENCOUNTER — Ambulatory Visit (INDEPENDENT_AMBULATORY_CARE_PROVIDER_SITE_OTHER)
Admission: RE | Admit: 2014-11-21 | Discharge: 2014-11-21 | Disposition: A | Discharge status: Home / Self Care | Payer: Medicare HMO | Source: Ambulatory Visit | Attending: Internal Medicine | Admitting: Internal Medicine

## 2014-11-21 VITALS — BP 110/62 | HR 93 | Ht 72.0 in | Wt 237.0 lb

## 2014-11-21 DIAGNOSIS — R0902 Hypoxemia: Secondary | ICD-10-CM

## 2014-11-21 DIAGNOSIS — G4733 Obstructive sleep apnea (adult) (pediatric): Secondary | ICD-10-CM | POA: Diagnosis not present

## 2014-11-21 DIAGNOSIS — J441 Chronic obstructive pulmonary disease with (acute) exacerbation: Secondary | ICD-10-CM

## 2014-11-21 DIAGNOSIS — J449 Chronic obstructive pulmonary disease, unspecified: Secondary | ICD-10-CM

## 2014-11-21 NOTE — Patient Instructions (Signed)
Order- DME [facility])  D/ C home O2  It is still best for you to use your CPAP all night, every night  Please show your CPAP mask to [facility] and talk with them about choice of mask for best fit.

## 2014-11-21 NOTE — Progress Notes (Signed)
Patient ID: [name], male    DOB: 1936/02/16, 79 y.o.   MRN: [id number]  HPI 03/05/11- 79 year old male former smoker seen because of obstructive sleep apnea on kind referral by Dr. Jacelyn Grip from [facility] in [place]. Wife(?) is here. A diagnostic sleep study on 01/31/2008 was done at [facility] because of insomnia with sleep apnea. This study confirmed moderately severe obstructive sleep apnea with an AHI of 22 per hour. A full face mask became uncomfortable, he got tired of it and stopped using CPAP several months to a year ago. Wife now reports loud snoring and again says he stays sleepy during the day although he has difficulty falling asleep. Bedtime between 8 and 9 PM with sleep latency at least to 30 minutes. He feels that he awakens and will lie awake or get up and wander around in the home  half of each night. He finally gets up around 5 AM. When he is asleep he feels that he is sleeping well but this is quite fragmented. He admits drowsiness during the day if he sits quietly. Occasional cough in the morning. Feels smothered lying supine, better on his sides. Treated for hypertension and elevated cholesterol but he denies heart or lung disease. Denies ENT surgery.  He had smoked heavily but quit 20 years ago.  04/23/11-  79 year old male former smoker seen because of obstructive sleep apnea. A male companion is with him. He has not been able to be compliant with CPAP, blaming pains in the back of his neck and spine related to wearing the CPAP. We discussed how it might be constraining motion until his muscles gets stiff. He wore it for one week out of the two-week auto titration trial. He has an older machine that worked well. He says his mask leak so that was unusable. His home care company would not replace it because he old money. He wants to skip the auto titration, go back to his old machine, change equipment supplier and get a new mask. We discussed CPAP, indications and alternatives,  medical concerns, and the documentation of adequate compliance to meet the Medicare rules. He isn't sure of the pressure setting on his old machine, which was one of the reasons we went for auto titration.  06/04/11- 79 year old male former smoker seen because of obstructive sleep apnea. Wife here. He got a replacement CPAP mask but is using his old machine. [facility] changed his pressure to 10. The current mask does not leak. He complains of insomnia with or without CPAP and that seems to be the biggest problem interfering with a sustained use of CPAP now.  07/30/11- 79 year old male former smoker seen because of obstructive sleep apnea. Wife here He try his old CPAP machine again but complains that humidifier gurgles in the flows even when he tries putting it lower than the level of his head so water runs back into the machine. He tried without she notify her but didn't like that either. Wife doesn't think CPAP stopped his snoring. He asks for a new sleep study which is the documentation we really need. He is sometimes restless at night even if he takes a sleeping pill.  09/02/19- 79 year old male former smoker seen because of obstructive sleep apnea.     NPSG 08/22/11- moderate OSA, AHI 26.8/hr with CPAP titration to 9 cwp for AHI 0.5/ hr. Oxygen was added at 2 L/M due to desaturation, and will need follow-up.  11/29/11- 79 year old male former smoker seen because of obstructive sleep apnea.  Pt hasnt worn mask fo 1 month. pt denies any sob,wheezing, chest congestion He was hospitalized twice for back surgery and I think complication of that surgery. Through that, he dropped off of CPAP. Wife says he still uses it a few hours at a time and it works when he wears it. Control has seemed  good at 9 CWP/ [facility]. Sleep is also disturbed by pain in his knees  02/29/12- 79 year old male former smoker seen because of obstructive sleep apnea.        Wife here  CPAP mask is not working well-unable to get one  until September due to insurance; Trying to wear CPAP every night  for 8-9 hours, but current mask hurts his face.  08/31/12- 79 year old male former smoker seen because of obstructive sleep apnea. FOLLOWS FOR: trying to wear CPAP every night but has hard time with mask around nose and pressure blowing into eyes; also states hard to lay/sleep on back. Leak worse in his preferred R decubitus sleep position. Wife says he snores through at times, but much better with CPAP. He is also concerned about dyspnea, especially with exertion. Dr Benay Spice recently suggested he might need to see a heart doctor. Denies chest pain, cough or wheeze. Apparently not a new problem.  03/05/13- 79 year old male former smoker seen because of obstructive sleep apnea. FOLLOWS FOR: hard to pull air in when putting machine on; then feels like too much air through the night; Wears CPAP 9/ [facility] every night. Would like refill of sleep medication as well.  Ambien has worked well with no problems. We discussed this again. CXR 3//3/14 IMPRESSION:  Negative chest.  Original Report Authenticated By: Orlean Patten, M.D.  07/13/13- 79 year old male former smoker seen because of obstructive sleep apnea. FOLLOWS FOR: pt had PFT done 06-2013 and Dr Bronson Ing wanted pt to be seen due to abnormal PFT results. CPAP Auto 7-12 [facility].usually okay but at times he has trouble sleeping with it. Download indicates pressure of 10 would work. Now has an incidental cold. Routinely easy dyspnea on exertion without cough or wheeze. Ankles have been swelling. Says cardiac workup negative. ABG on room air 06/15/2013-pH 7.44, PCO2 35.7, PO2 75.7, bicarbonate 24.1. Slight respiratory alkalosis. PFT 06/15/2013-mild obstructive airways disease with insignificant response to bronchodilator, slight air trapping, diffusion severely reduced. FVC 2.88/71%, FEV1 2.28/76%, FEV1/FVC 0.79/105%, FEF 25-75% 1.63/66%. RV/TLC 128%, DLCO 47%. CXR  04/09/13 IMPRESSION:  Minimal bibasilar atelectasis or scarring.  Electronically Signed  By: Lorin Picket M.D.  On: 04/09/2013 16:49  09/03/13- 79 year old male former smoker seen because of obstructive sleep apnea. FOLLOWS FOR: Wearing CPAP 10/ [facility] for about 8-10 hours/night. Feels pressure is too much. Pt states CPAP is giving him a cold and headache. DME is [facility]. Using nasal pillows mask. He finds using this "challenging". Denies nocturnal choking or choking with meals. We discussed how to use the humidifier attachment. CT chest 08/07/13 IMPRESSION:  No pulmonary embolism.  Lower lobe peribronchial thickening and debris within right greater  than left lower lobe bronchi. May reflect bronchitis or aspiration  pneumonitis.  4 cm left hepatic lobe lesion is favored to reflect a hemangioma.  There may be a smaller right hepatic lobe hemangioma versus blush of  variant perfusion.  Electronically Signed  By: Carlos Levering M.D.  On: 08/07/2013 00:50  01/14/14- 79 year old male former smoker seen because of obstructive sleep apnea, complicated by multiple myeloma, anemia, allergic rhinitis, renal insufficiency, asthma with COPD FOLLOWS FOR: Pt states that CPAP 10/ [facility] seems to  be working okay at night. Pt reports mask leaking --into eyes every night.             Wife here We again discussed CPAP importance, comfort issues, alternatives and Some dyspnea on exertion and chest tightness without pain, wheeze, cough or palpitation.  07/19/14- 79 year old male former smoker seen because of obstructive sleep apnea, complicated by multiple myeloma, anemia, allergic rhinitis, renal insufficiency, asthma with COPD   Wife here FOLLOWS FOR: Wears CPAP Auto/ [facility]/ O2 2L nightly. Pt states that he is not sure that it is working well for him. Not sleeping well and having nasal burning, itchy watery eyes. No download available-last date of registered data is 03/2013.  Says he sleeps okay at night  but feels "tired/trance". Not taking clonazepam. Easy shortness of breath with any exertion. Saw GI for recent GI bleed/hemorrhoids now better controlled. Pending appointment with oncology for multiple myeloma and anemia with recent transfusion. Denies cough, wheeze, phlegm, chest pain or palpitation.  11/21/14- 79 year old male former smoker seen because of obstructive sleep apnea, complicated by multiple myeloma/ plasmacytoma, anemia, allergic rhinitis, renal insufficiency, asthma with COPD   Wife here Follows For: Pt uses CPAP auto 7-10/ [facility]  8 hours occasionally. States that he goes a week without CPAP. States he sees no difference when using.  Pt c/o of SOB with activity, chest congestion. CXR was done before visit. Wife reports he wears CPAP and breathes quietly. Has old oxygen tank at home but says he checks his saturations and a remain normal. Still dyspnea with exertion. Says he got a good cardiology report. CXR 11/21/14- My read: COPD, NAD, nl heart size IMPRESSION: No active cardiopulmonary disease. Electronically Signed  By: Marijo Conception, M.D.  On: 11/21/2014 16:04  Review of Systems-see HPI Constitutional:   No-   weight loss, night sweats, fevers, chills, +fatigue, lassitude HEENT:   No-  headaches, difficulty swallowing, tooth/dental problems, sore throat,       No-  sneezing, itching, ear ache, +nasal congestion, post nasal drip,  CV:  No-   chest pain, orthopnea, PND, swelling in lower extremities, anasarca, dizziness, palpitations Resp: + shortness of breath with exertion or at rest.              No-   productive cough,  No non-productive cough,  No-  coughing up of blood.              No-   change in color of mucus.   wheezing.   Skin: No-   rash or lesions. GI:  No-   heartburn, indigestion, abdominal pain, nausea, vomiting,  GU: MS:  No-   joint pain or swelling.   Neuro- normal:  Psych:  No- change in mood or affect. No depression or anxiety.  No memory  loss.  Objective:   Physical Exam General- Alert, Oriented, Affect-appropriate, Distress- none acute,  Overweight,   92% at rest on room air Skin- rash-none, lesions- none, excoriation- none Lymphadenopathy- none Head- atraumatic            Eyes- Gross vision intact, PERRLA, conjunctivae clear secretions, +strabismus            Ears- Hearing, canals-normal            Nose- Clear, no-Septal dev, mucus, polyps, erosion, perforation             Throat- Mallampati II-III , mucosa clear , drainage- none, tonsils- atrophic Neck- flexible , trachea midline, no stridor , thyroid nl,  carotid no bruit Chest - symmetrical excursion , unlabored           Heart/CV- RRR , no murmur , no gallop  , no rub, nl s1 s2                           +JVD 1 cm , edema+trace, stasis changes- none, varices- none           Lung-  clear, unlabored, wheeze- none, cough- none , dullness-none, rub- none           Chest wall-  Abd- Br/ Gen/ Rectal- Not done, not indicated Extrem- cyanosis- none, clubbing, none, atrophy- none, strength- nl Neuro- grossly intact to observation

## 2014-11-24 ENCOUNTER — Other Ambulatory Visit: Payer: Self-pay | Admitting: Oncology

## 2014-11-25 ENCOUNTER — Other Ambulatory Visit (HOSPITAL_BASED_OUTPATIENT_CLINIC_OR_DEPARTMENT_OTHER): Payer: Medicare HMO

## 2014-11-25 ENCOUNTER — Ambulatory Visit (HOSPITAL_BASED_OUTPATIENT_CLINIC_OR_DEPARTMENT_OTHER): Payer: Medicare HMO

## 2014-11-25 ENCOUNTER — Telehealth: Payer: Self-pay | Admitting: Oncology

## 2014-11-25 ENCOUNTER — Ambulatory Visit (HOSPITAL_BASED_OUTPATIENT_CLINIC_OR_DEPARTMENT_OTHER): Payer: Medicare HMO | Admitting: Oncology

## 2014-11-25 VITALS — BP 121/72 | HR 91 | Temp 97.4°F | Resp 18 | Ht 72.0 in | Wt 238.5 lb

## 2014-11-25 DIAGNOSIS — D709 Neutropenia, unspecified: Secondary | ICD-10-CM | POA: Diagnosis not present

## 2014-11-25 DIAGNOSIS — D469 Myelodysplastic syndrome, unspecified: Secondary | ICD-10-CM | POA: Diagnosis not present

## 2014-11-25 DIAGNOSIS — C9000 Multiple myeloma not having achieved remission: Secondary | ICD-10-CM | POA: Diagnosis not present

## 2014-11-25 DIAGNOSIS — Z5111 Encounter for antineoplastic chemotherapy: Secondary | ICD-10-CM

## 2014-11-25 DIAGNOSIS — D630 Anemia in neoplastic disease: Secondary | ICD-10-CM

## 2014-11-25 DIAGNOSIS — Z86718 Personal history of other venous thrombosis and embolism: Secondary | ICD-10-CM

## 2014-11-25 DIAGNOSIS — D6481 Anemia due to antineoplastic chemotherapy: Secondary | ICD-10-CM | POA: Diagnosis not present

## 2014-11-25 DIAGNOSIS — E119 Type 2 diabetes mellitus without complications: Secondary | ICD-10-CM

## 2014-11-25 DIAGNOSIS — D61818 Other pancytopenia: Secondary | ICD-10-CM

## 2014-11-25 LAB — COMPREHENSIVE METABOLIC PANEL (CC13)
ALT: 8 U/L (ref 0–55)
AST: 18 U/L (ref 5–34)
Albumin: 3 g/dL — ABNORMAL LOW (ref 3.5–5.0)
Alkaline Phosphatase: 50 U/L (ref 40–150)
Anion Gap: 8 mEq/L (ref 3–11)
BUN: 11.6 mg/dL (ref 7.0–26.0)
CHLORIDE: 106 meq/L (ref 98–109)
CO2: 23 meq/L (ref 22–29)
Calcium: 9 mg/dL (ref 8.4–10.4)
Creatinine: 1.2 mg/dL (ref 0.7–1.3)
EGFR: 65 mL/min/{1.73_m2} — AB (ref >90)
Glucose: 89 mg/dl (ref 70–140)
Potassium: 4.1 mEq/L (ref 3.5–5.1)
SODIUM: 137 meq/L (ref 136–145)
Total Bilirubin: 0.65 mg/dL (ref 0.20–1.20)
Total Protein: 8.8 g/dL — ABNORMAL HIGH (ref 6.4–8.3)

## 2014-11-25 LAB — CBC WITH DIFFERENTIAL/PLATELET
BASO%: 1.4 % (ref 0.0–2.0)
BASOS ABS: 0 10*3/uL (ref 0.0–0.1)
EOS ABS: 0.1 10*3/uL (ref 0.0–0.5)
EOS%: 3 % (ref 0.0–7.0)
HCT: 37.9 % — ABNORMAL LOW (ref 38.4–49.9)
HEMOGLOBIN: 12.9 g/dL — AB (ref 13.0–17.1)
LYMPH#: 1.8 10*3/uL (ref 0.9–3.3)
LYMPH%: 58.8 % — ABNORMAL HIGH (ref 14.0–49.0)
MCH: 29.1 pg (ref 27.2–33.4)
MCHC: 34.2 g/dL (ref 32.0–36.0)
MCV: 85.2 fL (ref 79.3–98.0)
MONO#: 0.2 10*3/uL (ref 0.1–0.9)
MONO%: 6.6 % (ref 0.0–14.0)
NEUT%: 30.2 % — ABNORMAL LOW (ref 39.0–75.0)
NEUTROS ABS: 0.9 10*3/uL — AB (ref 1.5–6.5)
Platelets: 184 10*3/uL (ref 140–400)
RBC: 4.45 10*6/uL (ref 4.20–5.82)
RDW: 22.3 % — ABNORMAL HIGH (ref 11.0–14.6)
WBC: 3.1 10*3/uL — ABNORMAL LOW (ref 4.0–10.3)

## 2014-11-25 MED ORDER — AZACITIDINE CHEMO SQ INJECTION
76.0000 mg/m2 | Freq: Once | INTRAMUSCULAR | Status: AC
  Administered 2014-11-25: 180 mg via SUBCUTANEOUS
  Filled 2014-11-25: qty 7.2

## 2014-11-25 MED ORDER — ONDANSETRON HCL 8 MG PO TABS
ORAL_TABLET | ORAL | Status: AC
  Filled 2014-11-25: qty 1

## 2014-11-25 MED ORDER — ONDANSETRON HCL 8 MG PO TABS
8.0000 mg | ORAL_TABLET | Freq: Once | ORAL | Status: AC
  Administered 2014-11-25: 8 mg via ORAL

## 2014-11-25 NOTE — Patient Instructions (Signed)
[facility] Discharge Instructions for Patients Receiving Chemotherapy  Today you received the following chemotherapy agents: Vidaza   To help prevent nausea and vomiting after your treatment, we encourage you to take your nausea medication as directed.    If you develop nausea and vomiting that is not controlled by your nausea medication, call the clinic.   BELOW ARE SYMPTOMS THAT SHOULD BE REPORTED IMMEDIATELY:  *FEVER GREATER THAN 100.5 F  *CHILLS WITH OR WITHOUT FEVER  NAUSEA AND VOMITING THAT IS NOT CONTROLLED WITH YOUR NAUSEA MEDICATION  *UNUSUAL SHORTNESS OF BREATH  *UNUSUAL BRUISING OR BLEEDING  TENDERNESS IN MOUTH AND THROAT WITH OR WITHOUT PRESENCE OF ULCERS  *URINARY PROBLEMS  *BOWEL PROBLEMS  UNUSUAL RASH Items with * indicate a potential emergency and should be followed up as soon as possible.  Feel free to call the clinic you have any questions or concerns. The clinic phone number is [phone].  Please show the CHEMO ALERT CARD at check-in to the Emergency Department and triage nurse.   

## 2014-11-25 NOTE — Progress Notes (Signed)
Okay to treat with ANC of 0.9 per Dr. Benay Spice.

## 2014-11-25 NOTE — Progress Notes (Signed)
[facility] OFFICE PROGRESS NOTE   Diagnosis: Myelodysplasia, multiple myeloma  INTERVAL HISTORY:   [name] returns as scheduled. He completed another cycle of 5 azacytidine beginning 10/28/2014. He reports tolerating the treatment well. He complains of arthritis pain in the knees and lower back. The pain improves with ambulation. No fever. [name] has a good appetite.  Objective:  Vital signs in last 24 hours:  Blood pressure 121/72, pulse 91, temperature 97.4 F (36.3 C), temperature source Oral, resp. rate 18, height 6' (1.829 m), weight 238 lb 8 oz (108.183 kg), SpO2 98 %.    HEENT: No thrush or ulcers Resp: Coarse rhonchi at the lower posterior chest bilaterally, no respiratory distress Cardio: Regular rate and rhythm GI: No hepatosplenomegaly Vascular: No leg edema   Lab Results:  Lab Results  Component Value Date   WBC 3.1* 11/25/2014   HGB 12.9* 11/25/2014   HCT 37.9* 11/25/2014   MCV 85.2 11/25/2014   PLT 184 11/25/2014   NEUTROABS 0.9* 11/25/2014   10/28/2014-IgG 3810, serum M spike 3.1   Imaging:  Dg Chest 2 View  11/21/2014   CLINICAL DATA:  Chronic obstructive pulmonary disease. Hypertension.  EXAM: CHEST  2 VIEW  COMPARISON:  July 19, 2014  FINDINGS: The heart size and mediastinal contours are within normal limits. Both lungs are clear. No pneumothorax or pleural effusion is noted. Hyperinflation of the lungs is again noted consistent with chronic obstructive pulmonary disease. Nodular density seen in right lung on prior exam is not clearly visualized currently. The visualized skeletal structures are unremarkable.  IMPRESSION: No active cardiopulmonary disease.   Electronically Signed   By: Marijo Conception, M.D.   On: 11/21/2014 16:04    Medications: I have reviewed the patient's current medications.  Assessment/Plan: 1. Multiple myeloma, status post treatment with melphalan/prednisone/thalidomide beginning in November 2009. The  thalidomide was increased to 200 mg daily beginning 09/11/2008. The serum M spike was slightly improved on 03/18/2009 and slightly increased on 05/09/2009. He has been maintained off of specific therapy for multiple myeloma since September 2010. The serum M spike was increased on 10/20/2011 at 4.6. He began Revlimid on 11/11/2011 with weekly dexamethasone. He began cycle 2 of Revlimid on 12/09/2011. The serum M spike was improved on 12/31/2011. The serum M spike was slightly higher on 01/28/2012. He began cycle 4 on 02/03/2012. He developed recurrent hypercalcemia. Treatment was changed to Cytoxan/Velcade/Decadron beginning 03/02/2012. Cytoxan discontinued in January 2015  Treatment continued with Velcade/Decadron.   Stable serum M spike and IgG 10/04/2013.   Stable serum M spike 12/06/2013   Treatment placed on hold 01/10/2014 secondary to thrombocytopenia and a slightly higher M spike   Serum M spike and IgG increased 03/07/2014.  Cycle 1 Pomalidomide/dexamethasone 04/01/2014.  Pomalidomide subsequently placed on hold due to cytopenias.  Pomalidomide resumed at a reduced dose 05/23/2014.  Pomalidomide placed on hold 06/04/2014 due to progressive pancytopenia. 2. Chronic "dizziness."  3. Chest x-ray 11/01/2008 with a patchy opacity at the right midlung suspicious for pneumonia. He was treated with Avelox. 4. Low back pain with a radicular component. An MRI on 10/07/2008 showed no involvement of the lumbosacral spine with myeloma. 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 5. Bilateral neck pain, status post a CT 05/17/2008 with findings of spinal stenosis and osteoarthritis. 6. 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 Red cell microcytosis, likely related  to hemoglobin C trait. A ferritin level returned elevated and a stool  Hemoccult was negative 01/23/2010. He underwent a colonoscopy in 2009. 8. Elevated beta-2 microglobulin level. 9. History of hypertension. Blood pressure medication placed on hold December 2015 due to hypotension. 10. History of a herniated disk at L4-L5 on MRI an 01/11/2002. 11. Hyperlipidemia. 12. Gastroesophageal reflux disease. 13. History of atypical angina. 14. History of rectal bleeding-large external hemorrhoids noted 02/25/2012. The stool was Hemoccult positive. He has a history of colon polyps. He is followed by [facility]. He was diagnosed with hemorrhoids in September of 2013 while hospitalized. He underwent a band procedure 03/09/2013. He has had no further rectal bleeding. 15. Superficial venous thrombosis of the greater saphenous vein on a Doppler 09/27/2008. Negative for deep vein thrombosis. Symptoms improved with aspirin. 16. Neck pain with numbness/tingling in the arms, hands, and low back with proximal right leg weakness. An MRI of the cervical spine 05/30/2009 showed multilevel spondylosis with mild cord edema at C4-C5 and C5-C6. He was noted to have an enhancing lesion of the cord at T3-T4 of unclear etiology. In the lumbar spine there was no change in the spondylosis at L3-L4 and L4-L5. There was no involvement of the cervical or lumbar spine with myeloma. He is status post C4-C5, C5-C6, and C6-C7 anterior cervical diskectomy with fusion by Dr. Annette Stable 08/01/2009. 17. Thrombocytopenia-progressive, secondary to myelodysplasia and 5 azacytidine. 18. Indeterminate-age deep vein thrombosis of the left lower extremity on a venous Doppler 08/15/2009. There were also findings consistent with superficial thrombosis involving the right lower extremity 08/15/2009. Coumadin was discontinued in October 2011. 19. Type 2 diabetes. Now on an oral hypoglycemic 20. History of hematuria, followed at [facility]. Urinalysis was negative for blood on 07/23/2011. 21. Radicular back pain with MRI of  the lumbar spine on 10/19/2011 showing nerve root impingement at L2-L3, L3-L4 and L4-L5 with the most severe at the right L3 nerve roots due to a large disc fragment. Associated right leg weakness. He underwent right L2-3 decompressive laminotomy with right L2 and L3 decompressive foraminotomy; right L2-3 microdiscectomy on 11/01/2011. 22. Constipation. He continues a laxative regimen. 23. Hospitalization 11/08/2011 through 11/10/2011 with orthostatic hypotension/dizziness. He improved with intravenous hydration. He had mild hypotension when here on 11/18/2011. We recommended discontinuing Norvasc and Proscar. 24. Hypercalcemia status post pamidronate 11/03/2011. Recurrent hypercalcemia 02/16/2012 status post Zometa. The calcium has remained in normal range. 25. Fractured tooth with associated pain. Status post a tooth extraction by Dr. Enrique Sack, Zometa was placed on hold. 26. Exertional dyspnea. Stable. He was referred to cardiology, pulmonary function studies 06/15/2013 showed a moderately severe diffusion defect. He utilizes supplemental oxygen as needed. He is followed by pulmonary. 27. 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 28. 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   Status post cycle 1 5-azacytidine beginning 07/08/2014 4 days (day 5 held).  Cycle 2 5-azacytidine beginning 08/05/2014 5 days with Neulasta support.  Cycle 3 5-azacytidine beginning 09/02/2014 with Neulasta support.  Cycle 4 5 azacytidine beginning 09/30/2014 with Neulasta support  Cycle 5 5-azacytidine beginning 10/28/2014 with Neulasta support 29. History of hypotension. Blood pressure medication placed on hold December 2015.  Disposition:  [name]  appears stable. He has completed 5 cycles of 5 azacytidine. The pancytopenia has improved. He has persistent  mild neutropenia. He knows to contact us for a fever. The plan is to proceed with cycle 65 azacytidine today. The serum M spike and IgG level were higher last month. We repeated these today. We will need to consider resuming treatment for multiple myeloma if he develops progressive cytopenias or other evidence of progressive myeloma.  Betsy Coder, MD  11/25/2014  1:27 PM

## 2014-11-25 NOTE — Telephone Encounter (Signed)
Gave and printed appt sched and avs for pt for May and JUNE °

## 2014-11-26 ENCOUNTER — Other Ambulatory Visit: Payer: Self-pay | Admitting: Family Medicine

## 2014-11-26 ENCOUNTER — Ambulatory Visit (HOSPITAL_BASED_OUTPATIENT_CLINIC_OR_DEPARTMENT_OTHER): Payer: Medicare HMO

## 2014-11-26 DIAGNOSIS — D469 Myelodysplastic syndrome, unspecified: Secondary | ICD-10-CM | POA: Diagnosis not present

## 2014-11-26 DIAGNOSIS — Z5111 Encounter for antineoplastic chemotherapy: Secondary | ICD-10-CM | POA: Diagnosis not present

## 2014-11-26 DIAGNOSIS — C9000 Multiple myeloma not having achieved remission: Secondary | ICD-10-CM

## 2014-11-26 MED ORDER — ONDANSETRON HCL 8 MG PO TABS
ORAL_TABLET | ORAL | Status: AC
  Filled 2014-11-26: qty 1

## 2014-11-26 MED ORDER — AZACITIDINE CHEMO SQ INJECTION
76.0000 mg/m2 | Freq: Once | INTRAMUSCULAR | Status: AC
  Administered 2014-11-26: 180 mg via SUBCUTANEOUS
  Filled 2014-11-26: qty 7.2

## 2014-11-26 MED ORDER — ONDANSETRON HCL 8 MG PO TABS
8.0000 mg | ORAL_TABLET | Freq: Once | ORAL | Status: AC
  Administered 2014-11-26: 8 mg via ORAL

## 2014-11-26 NOTE — Patient Instructions (Signed)
Azacitidine suspension for injection (subcutaneous use) What is this medicine? AZACITIDINE (ay [place]) is a chemotherapy drug. This medicine reduces the growth of cancer cells and can suppress the immune system. It is used for treating myelodysplastic syndrome or some types of leukemia. This medicine may be used for other purposes; ask your health care provider or pharmacist if you have questions. COMMON BRAND NAME(S): Vidaza What should I tell my health care provider before I take this medicine? They need to know if you have any of these conditions: -infection (especially a virus infection such as chickenpox, cold sores, or herpes) -kidney disease -liver disease -liver tumors -an unusual or allergic reaction to azacitidine, mannitol, other medicines, foods, dyes, or preservatives -pregnant or trying to get pregnant -breast-feeding How should I use this medicine? This medicine is for injection under the skin. It is administered in a hospital or clinic by a specially trained health care professional. Talk to your pediatrician regarding the use of this medicine in children. While this drug may be prescribed for selected conditions, precautions do apply. Overdosage: If you think you have taken too much of this medicine contact a poison control center or emergency room at once. NOTE: This medicine is only for you. Do not share this medicine with others. What if I miss a dose? It is important not to miss your dose. Call your doctor or health care professional if you are unable to keep an appointment. What may interact with this medicine? -vaccines Talk to your doctor or health care professional before taking any of these medicines: -acetaminophen -aspirin -ibuprofen -ketoprofen -naproxen This list may not describe all possible interactions. Give your health care provider a list of all the medicines, herbs, non-prescription drugs, or dietary supplements you use. Also tell them if you  smoke, drink alcohol, or use illegal drugs. Some items may interact with your medicine. What should I watch for while using this medicine? Visit your doctor for checks on your progress. This drug may make you feel generally unwell. This is not uncommon, as chemotherapy can affect healthy cells as well as cancer cells. Report any side effects. Continue your course of treatment even though you feel ill unless your doctor tells you to stop. In some cases, you may be given additional medicines to help with side effects. Follow all directions for their use. Call your doctor or health care professional for advice if you get a fever, chills or sore throat, or other symptoms of a cold or flu. Do not treat yourself. This drug decreases your body's ability to fight infections. Try to avoid being around people who are sick. This medicine may increase your risk to bruise or bleed. Call your doctor or health care professional if you notice any unusual bleeding. Be careful brushing and flossing your teeth or using a toothpick because you may get an infection or bleed more easily. If you have any dental work done, tell your dentist you are receiving this medicine. Avoid taking products that contain aspirin, acetaminophen, ibuprofen, naproxen, or ketoprofen unless instructed by your doctor. These medicines may hide a fever. Do not have any vaccinations without your doctor's approval and avoid anyone who has recently had oral polio vaccine. Do not become pregnant while taking this medicine. Women should inform their doctor if they wish to become pregnant or think they might be pregnant. There is a potential for serious side effects to an unborn child. Talk to your health care professional or pharmacist for more information.  Do not breast-feed an infant while taking this medicine. If you are a man, you should not father a child while receiving treatment. What side effects may I notice from receiving this medicine? Side  effects that you should report to your doctor or health care professional as soon as possible: -allergic reactions like skin rash, itching or hives, swelling of the face, lips, or tongue -low blood counts - this medicine may decrease the number of white blood cells, red blood cells and platelets. You may be at increased risk for infections and bleeding. -signs of infection - fever or chills, cough, sore throat, pain or difficulty passing urine -signs of decreased platelets or bleeding - bruising, pinpoint red spots on the skin, black, tarry stools, blood in the urine -signs of decreased red blood cells - unusually weak or tired, fainting spells, lightheadedness -reactions at the injection site including redness, pain, itching, or bruising -breathing problems -changes in vision -fever -mouth sores -stomach pain -vomiting Side effects that usually do not require medical attention (report to your doctor or health care professional if they continue or are bothersome): -constipation -diarrhea -loss of appetite -nausea -pain or redness at the injection site -weak or tired This list may not describe all possible side effects. Call your doctor for medical advice about side effects. You may report side effects to FDA at 1-800-FDA-1088. Where should I keep my medicine? This drug is given in a hospital or clinic and will not be stored at home. NOTE: This sheet is a summary. It may not cover all possible information. If you have questions about this medicine, talk to your doctor, pharmacist, or health care provider.  2015, [employer]. (2007-09-21 11:04:07)

## 2014-11-27 ENCOUNTER — Ambulatory Visit (HOSPITAL_BASED_OUTPATIENT_CLINIC_OR_DEPARTMENT_OTHER): Payer: Medicare HMO

## 2014-11-27 VITALS — BP 105/55 | HR 91 | Temp 98.2°F

## 2014-11-27 DIAGNOSIS — C9000 Multiple myeloma not having achieved remission: Secondary | ICD-10-CM

## 2014-11-27 DIAGNOSIS — Z5111 Encounter for antineoplastic chemotherapy: Secondary | ICD-10-CM | POA: Diagnosis not present

## 2014-11-27 DIAGNOSIS — D469 Myelodysplastic syndrome, unspecified: Secondary | ICD-10-CM | POA: Diagnosis not present

## 2014-11-27 LAB — PROTEIN ELECTROPHORESIS, SERUM
ALPHA-2-GLOBULIN: 0.6 g/dL (ref 0.5–0.9)
Abnormal Protein Band1: 2.9 g/dL
Albumin ELP: 3.6 g/dL — ABNORMAL LOW (ref 3.8–4.8)
Alpha-1-Globulin: 0.4 g/dL — ABNORMAL HIGH (ref 0.2–0.3)
BETA GLOBULIN: 0.4 g/dL (ref 0.4–0.6)
Beta 2: 0.2 g/dL (ref 0.2–0.5)
GAMMA GLOBULIN: 3.3 g/dL — AB (ref 0.8–1.7)
TOTAL PROTEIN, SERUM ELECTROPHOR: 8.5 g/dL — AB (ref 6.1–8.1)

## 2014-11-27 LAB — IGG: IGG (IMMUNOGLOBIN G), SERUM: 3930 mg/dL — AB (ref 650–1600)

## 2014-11-27 MED ORDER — AZACITIDINE CHEMO SQ INJECTION
76.0000 mg/m2 | Freq: Once | INTRAMUSCULAR | Status: AC
  Administered 2014-11-27: 180 mg via SUBCUTANEOUS
  Filled 2014-11-27: qty 7.2

## 2014-11-27 MED ORDER — ONDANSETRON HCL 8 MG PO TABS
8.0000 mg | ORAL_TABLET | Freq: Once | ORAL | Status: AC
  Administered 2014-11-27: 8 mg via ORAL

## 2014-11-27 MED ORDER — ONDANSETRON HCL 8 MG PO TABS
ORAL_TABLET | ORAL | Status: AC
  Filled 2014-11-27: qty 1

## 2014-11-27 NOTE — Patient Instructions (Signed)
[facility] Discharge Instructions for Patients Receiving Chemotherapy  Today you received the following chemotherapy agents vidaza   To help prevent nausea and vomiting after your treatment, we encourage you to take your nausea medication as directed. If you develop nausea and vomiting that is not controlled by your nausea medication, call the clinic.   BELOW ARE SYMPTOMS THAT SHOULD BE REPORTED IMMEDIATELY:  *FEVER GREATER THAN 100.5 F  *CHILLS WITH OR WITHOUT FEVER  NAUSEA AND VOMITING THAT IS NOT CONTROLLED WITH YOUR NAUSEA MEDICATION  *UNUSUAL SHORTNESS OF BREATH  *UNUSUAL BRUISING OR BLEEDING  TENDERNESS IN MOUTH AND THROAT WITH OR WITHOUT PRESENCE OF ULCERS  *URINARY PROBLEMS  *BOWEL PROBLEMS  UNUSUAL RASH Items with * indicate a potential emergency and should be followed up as soon as possible.  Feel free to call the clinic you have any questions or concerns. The clinic phone number is [phone].  

## 2014-11-28 ENCOUNTER — Ambulatory Visit (HOSPITAL_BASED_OUTPATIENT_CLINIC_OR_DEPARTMENT_OTHER): Payer: Medicare HMO

## 2014-11-28 VITALS — BP 107/68 | HR 83 | Temp 97.7°F

## 2014-11-28 DIAGNOSIS — Z5111 Encounter for antineoplastic chemotherapy: Secondary | ICD-10-CM | POA: Diagnosis not present

## 2014-11-28 DIAGNOSIS — D469 Myelodysplastic syndrome, unspecified: Secondary | ICD-10-CM | POA: Diagnosis not present

## 2014-11-28 DIAGNOSIS — C9000 Multiple myeloma not having achieved remission: Secondary | ICD-10-CM

## 2014-11-28 MED ORDER — ONDANSETRON HCL 8 MG PO TABS
ORAL_TABLET | ORAL | Status: AC
  Filled 2014-11-28: qty 1

## 2014-11-28 MED ORDER — ONDANSETRON HCL 8 MG PO TABS
8.0000 mg | ORAL_TABLET | Freq: Once | ORAL | Status: AC
  Administered 2014-11-28: 8 mg via ORAL

## 2014-11-28 MED ORDER — AZACITIDINE CHEMO SQ INJECTION
180.0000 mg | Freq: Once | INTRAMUSCULAR | Status: AC
  Administered 2014-11-28: 180 mg via SUBCUTANEOUS
  Filled 2014-11-28: qty 7.2

## 2014-11-28 NOTE — Patient Instructions (Signed)
[facility] Discharge Instructions for Patients Receiving Chemotherapy  Today you received the following chemotherapy agents: Vidaza.   To help prevent nausea and vomiting after your treatment, we encourage you to take your nausea medication as directed.   If you develop nausea and vomiting that is not controlled by your nausea medication, call the clinic. If it is after clinic hours your family physician or the after hours number for the clinic or go to the Emergency Department.   BELOW ARE SYMPTOMS THAT SHOULD BE REPORTED IMMEDIATELY:  *FEVER GREATER THAN 101.0 F  *CHILLS WITH OR WITHOUT FEVER  NAUSEA AND VOMITING THAT IS NOT CONTROLLED WITH YOUR NAUSEA MEDICATION  *UNUSUAL SHORTNESS OF BREATH  *UNUSUAL BRUISING OR BLEEDING  TENDERNESS IN MOUTH AND THROAT WITH OR WITHOUT PRESENCE OF ULCERS  *URINARY PROBLEMS  *BOWEL PROBLEMS  UNUSUAL RASH Items with * indicate a potential emergency and should be followed up as soon as possible.  One of the nurses will contact you 24 hours after your treatment. Please let the nurse know about any problems that you may have experienced. Feel free to call the clinic you have any questions or concerns. The clinic phone number is [phone].   I have been informed and understand all the instructions given to me. I know to contact the clinic, my physician, or go to the Emergency Department if any problems should occur. I do not have any questions at this time, but understand that I may call the clinic during office hours or the Patient Navigator at [phone] should I have any questions or need assistance in obtaining follow up care.    __________________________________________  _____________  __________ Signature of Patient or Authorized Representative            Date                   Time    __________________________________________ Nurse's Signature

## 2014-11-29 ENCOUNTER — Ambulatory Visit (HOSPITAL_BASED_OUTPATIENT_CLINIC_OR_DEPARTMENT_OTHER): Payer: Medicare HMO

## 2014-11-29 VITALS — BP 100/71 | HR 88 | Temp 97.9°F | Resp 18

## 2014-11-29 DIAGNOSIS — Z5111 Encounter for antineoplastic chemotherapy: Secondary | ICD-10-CM

## 2014-11-29 DIAGNOSIS — D469 Myelodysplastic syndrome, unspecified: Secondary | ICD-10-CM | POA: Diagnosis not present

## 2014-11-29 DIAGNOSIS — C9000 Multiple myeloma not having achieved remission: Secondary | ICD-10-CM

## 2014-11-29 MED ORDER — AZACITIDINE CHEMO SQ INJECTION
76.0000 mg/m2 | Freq: Once | INTRAMUSCULAR | Status: AC
  Administered 2014-11-29: 180 mg via SUBCUTANEOUS
  Filled 2014-11-29: qty 7.2

## 2014-11-29 MED ORDER — ONDANSETRON HCL 8 MG PO TABS
8.0000 mg | ORAL_TABLET | Freq: Once | ORAL | Status: AC
  Administered 2014-11-29: 8 mg via ORAL

## 2014-11-29 MED ORDER — ONDANSETRON HCL 8 MG PO TABS
ORAL_TABLET | ORAL | Status: AC
  Filled 2014-11-29: qty 1

## 2014-11-29 NOTE — Patient Instructions (Signed)
[facility] Discharge Instructions for Patients Receiving Chemotherapy  Today you received the following chemotherapy agents: Vidaza   To help prevent nausea and vomiting after your treatment, we encourage you to take your nausea medication as directed.    If you develop nausea and vomiting that is not controlled by your nausea medication, call the clinic.   BELOW ARE SYMPTOMS THAT SHOULD BE REPORTED IMMEDIATELY:  *FEVER GREATER THAN 100.5 F  *CHILLS WITH OR WITHOUT FEVER  NAUSEA AND VOMITING THAT IS NOT CONTROLLED WITH YOUR NAUSEA MEDICATION  *UNUSUAL SHORTNESS OF BREATH  *UNUSUAL BRUISING OR BLEEDING  TENDERNESS IN MOUTH AND THROAT WITH OR WITHOUT PRESENCE OF ULCERS  *URINARY PROBLEMS  *BOWEL PROBLEMS  UNUSUAL RASH Items with * indicate a potential emergency and should be followed up as soon as possible.  Feel free to call the clinic you have any questions or concerns. The clinic phone number is [phone].  Please show the CHEMO ALERT CARD at check-in to the Emergency Department and triage nurse.   

## 2014-11-30 ENCOUNTER — Ambulatory Visit (HOSPITAL_BASED_OUTPATIENT_CLINIC_OR_DEPARTMENT_OTHER): Payer: Medicare HMO

## 2014-11-30 VITALS — BP 115/70 | HR 95 | Temp 97.5°F | Resp 16

## 2014-11-30 DIAGNOSIS — Z5189 Encounter for other specified aftercare: Secondary | ICD-10-CM | POA: Diagnosis not present

## 2014-11-30 DIAGNOSIS — C9000 Multiple myeloma not having achieved remission: Secondary | ICD-10-CM | POA: Diagnosis not present

## 2014-11-30 DIAGNOSIS — D469 Myelodysplastic syndrome, unspecified: Secondary | ICD-10-CM | POA: Diagnosis not present

## 2014-11-30 DIAGNOSIS — D61810 Antineoplastic chemotherapy induced pancytopenia: Secondary | ICD-10-CM

## 2014-11-30 MED ORDER — PEGFILGRASTIM INJECTION 6 MG/0.6ML
6.0000 mg | Freq: Once | SUBCUTANEOUS | Status: AC
  Administered 2014-11-30: 6 mg via SUBCUTANEOUS

## 2014-12-14 NOTE — Assessment & Plan Note (Signed)
We are documenting need for oxygen and he isn't using it but still pays. Plan-DME [employer] home oxygen.

## 2014-12-14 NOTE — Assessment & Plan Note (Signed)
Stable without acute exacerbation. Dyspnea will also be affected by anemia as explained.

## 2014-12-14 NOTE — Assessment & Plan Note (Signed)
Pressure and control seem good but he continues to need reminder to meet compliance goals. Wife is supportive.

## 2014-12-19 ENCOUNTER — Other Ambulatory Visit: Payer: Self-pay | Admitting: Family

## 2014-12-22 ENCOUNTER — Other Ambulatory Visit: Payer: Self-pay | Admitting: Oncology

## 2014-12-23 ENCOUNTER — Ambulatory Visit (HOSPITAL_BASED_OUTPATIENT_CLINIC_OR_DEPARTMENT_OTHER): Payer: Medicare HMO | Admitting: Nurse Practitioner

## 2014-12-23 ENCOUNTER — Other Ambulatory Visit (HOSPITAL_BASED_OUTPATIENT_CLINIC_OR_DEPARTMENT_OTHER): Payer: Medicare HMO

## 2014-12-23 ENCOUNTER — Ambulatory Visit (HOSPITAL_BASED_OUTPATIENT_CLINIC_OR_DEPARTMENT_OTHER): Payer: Medicare HMO

## 2014-12-23 ENCOUNTER — Telehealth: Payer: Self-pay | Admitting: Oncology

## 2014-12-23 VITALS — BP 143/77 | HR 87 | Temp 97.8°F | Resp 18 | Ht 72.0 in | Wt 235.2 lb

## 2014-12-23 DIAGNOSIS — D696 Thrombocytopenia, unspecified: Secondary | ICD-10-CM

## 2014-12-23 DIAGNOSIS — R42 Dizziness and giddiness: Secondary | ICD-10-CM

## 2014-12-23 DIAGNOSIS — D630 Anemia in neoplastic disease: Secondary | ICD-10-CM | POA: Diagnosis not present

## 2014-12-23 DIAGNOSIS — D469 Myelodysplastic syndrome, unspecified: Secondary | ICD-10-CM

## 2014-12-23 DIAGNOSIS — D709 Neutropenia, unspecified: Secondary | ICD-10-CM

## 2014-12-23 DIAGNOSIS — C9000 Multiple myeloma not having achieved remission: Secondary | ICD-10-CM

## 2014-12-23 DIAGNOSIS — E119 Type 2 diabetes mellitus without complications: Secondary | ICD-10-CM

## 2014-12-23 DIAGNOSIS — D6481 Anemia due to antineoplastic chemotherapy: Secondary | ICD-10-CM

## 2014-12-23 DIAGNOSIS — I10 Essential (primary) hypertension: Secondary | ICD-10-CM

## 2014-12-23 DIAGNOSIS — G8929 Other chronic pain: Secondary | ICD-10-CM

## 2014-12-23 LAB — COMPREHENSIVE METABOLIC PANEL (CC13)
ALK PHOS: 44 U/L (ref 40–150)
ALT: 7 U/L (ref 0–55)
AST: 20 U/L (ref 5–34)
Albumin: 2.9 g/dL — ABNORMAL LOW (ref 3.5–5.0)
Anion Gap: 8 mEq/L (ref 3–11)
BUN: 10.8 mg/dL (ref 7.0–26.0)
CO2: 22 mEq/L (ref 22–29)
CREATININE: 1 mg/dL (ref 0.7–1.3)
Calcium: 8.7 mg/dL (ref 8.4–10.4)
Chloride: 108 mEq/L (ref 98–109)
EGFR: 81 mL/min/{1.73_m2} — AB (ref >90)
Glucose: 83 mg/dl (ref 70–140)
POTASSIUM: 3.6 meq/L (ref 3.5–5.1)
Sodium: 138 mEq/L (ref 136–145)
Total Bilirubin: 0.7 mg/dL (ref 0.20–1.20)
Total Protein: 8.7 g/dL — ABNORMAL HIGH (ref 6.4–8.3)

## 2014-12-23 LAB — CBC WITH DIFFERENTIAL/PLATELET
BASO%: 1.6 % (ref 0.0–2.0)
Basophils Absolute: 0 10*3/uL (ref 0.0–0.1)
EOS ABS: 0.1 10*3/uL (ref 0.0–0.5)
EOS%: 4.4 % (ref 0.0–7.0)
HCT: 33.2 % — ABNORMAL LOW (ref 38.4–49.9)
HEMOGLOBIN: 11.3 g/dL — AB (ref 13.0–17.1)
LYMPH#: 2 10*3/uL (ref 0.9–3.3)
LYMPH%: 66.8 % — ABNORMAL HIGH (ref 14.0–49.0)
MCH: 29.6 pg (ref 27.2–33.4)
MCHC: 34 g/dL (ref 32.0–36.0)
MCV: 87.2 fL (ref 79.3–98.0)
MONO#: 0.1 10*3/uL (ref 0.1–0.9)
MONO%: 1.7 % (ref 0.0–14.0)
NEUT%: 25.5 % — ABNORMAL LOW (ref 39.0–75.0)
NEUTROS ABS: 0.8 10*3/uL — AB (ref 1.5–6.5)
Platelets: 120 10*3/uL — ABNORMAL LOW (ref 140–400)
RBC: 3.81 10*6/uL — AB (ref 4.20–5.82)
RDW: 24.9 % — AB (ref 11.0–14.6)
WBC: 3 10*3/uL — AB (ref 4.0–10.3)

## 2014-12-23 LAB — TECHNOLOGIST REVIEW: Technologist Review: 3

## 2014-12-23 MED ORDER — LACTULOSE 10 GM/15ML PO SOLN: 10.0000 g | Freq: Two times a day (BID) | ORAL | Status: DC | PRN

## 2014-12-23 MED ORDER — ONDANSETRON HCL 8 MG PO TABS
8.0000 mg | ORAL_TABLET | Freq: Once | ORAL | Status: AC
  Administered 2014-12-23: 8 mg via ORAL

## 2014-12-23 MED ORDER — ONDANSETRON HCL 8 MG PO TABS
ORAL_TABLET | ORAL | Status: AC
  Filled 2014-12-23: qty 1

## 2014-12-23 MED ORDER — AZACITIDINE CHEMO SQ INJECTION
180.0000 mg | Freq: Once | INTRAMUSCULAR | Status: AC
  Administered 2014-12-23: 180 mg via SUBCUTANEOUS
  Filled 2014-12-23: qty 7.2

## 2014-12-23 NOTE — Progress Notes (Signed)
[place] OFFICE PROGRESS NOTE   Diagnosis: Myelodysplasia, multiple myeloma    INTERVAL HISTORY:   [name] returns as scheduled. He completed cycle 6 5-azacytidine beginning 11/25/2014. He denies nausea/vomiting. No mouth sores. He had some loose stools last week. No bleeding. He denies fever. Stable chronic pain. No new areas of pain. Good appetite. He continues to have intermittent dizziness. No unusual headaches. No visual disturbance.  Objective:  Vital signs in last 24 hours:  Blood pressure 143/77, pulse 87, temperature 97.8 F (36.6 C), temperature source Oral, resp. rate 18, height 6' (1.829 m), weight 235 lb 3.2 oz (106.686 kg), SpO2 97 %.    HEENT: No thrush or ulcers. Resp: Lungs clear bilaterally. Cardio: Regular rate and rhythm. GI: Abdomen soft and nontender. No organomegaly. Vascular: Trace lower leg edema bilaterally. Neuro: Alert and oriented.  Skin: Scattered areas of hyperpigmentation over the lower abdominal wall.    Lab Results:  Lab Results  Component Value Date   WBC 3.0* 12/23/2014   HGB 11.3* 12/23/2014   HCT 33.2* 12/23/2014   MCV 87.2 12/23/2014   PLT 120* 12/23/2014   NEUTROABS 0.8* 12/23/2014    Imaging:  No results found.  Medications: I have reviewed the patient's current medications.  Assessment/Plan: 1. Multiple myeloma, status post treatment with melphalan/prednisone/thalidomide beginning in November 2009. The thalidomide was increased to 200 mg daily beginning 09/11/2008. The serum M spike was slightly improved on 03/18/2009 and slightly increased on 05/09/2009. He has been maintained off of specific therapy for multiple myeloma since September 2010. The serum M spike was increased on 10/20/2011 at 4.6. He began Revlimid on 11/11/2011 with weekly dexamethasone. He began cycle 2 of Revlimid on 12/09/2011. The serum M spike was improved on 12/31/2011. The serum M spike was slightly higher on 01/28/2012. He began cycle  4 on 02/03/2012. He developed recurrent hypercalcemia. Treatment was changed to Cytoxan/Velcade/Decadron beginning 03/02/2012. Cytoxan discontinued in January 2015  Treatment continued with Velcade/Decadron.   Stable serum M spike and IgG 10/04/2013.   Stable serum M spike 12/06/2013   Treatment placed on hold 01/10/2014 secondary to thrombocytopenia and a slightly higher M spike   Serum M spike and IgG increased 03/07/2014.  Cycle 1 Pomalidomide/dexamethasone 04/01/2014.  Pomalidomide subsequently placed on hold due to cytopenias.  Pomalidomide resumed at a reduced dose 05/23/2014.  Pomalidomide placed on hold 06/04/2014 due to progressive pancytopenia. 2. Chronic "dizziness."  3. Chest x-ray 11/01/2008 with a patchy opacity at the right midlung suspicious for pneumonia. He was treated with Avelox. 4. Low back pain with a radicular component. An MRI on 10/07/2008 showed no involvement of the lumbosacral spine with myeloma. 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 5. Bilateral neck pain, status post a CT 05/17/2008 with findings of spinal stenosis and osteoarthritis. 6. 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 Red cell microcytosis, likely related to hemoglobin C trait. A ferritin level returned elevated and a stool Hemoccult was negative 01/23/2010. He underwent a colonoscopy in 2009. 8. Elevated beta-2 microglobulin level. 9. History of hypertension. Blood pressure medication placed on hold December 2015 due to hypotension. 10. History of a herniated disk at L4-L5 on MRI an 01/11/2002. 11. Hyperlipidemia. 12. Gastroesophageal reflux disease. 13. History of atypical angina. 14. History of rectal bleeding-large external hemorrhoids noted 02/25/2012. The stool was Hemoccult positive. He has a history of colon polyps. He is  followed by [facility]. He was diagnosed with hemorrhoids in September of 2013 while hospitalized. He underwent a band procedure 03/09/2013. He has had no further rectal bleeding. 15. Superficial venous thrombosis of the greater saphenous vein on a Doppler 09/27/2008. Negative for deep vein thrombosis. Symptoms improved with aspirin. 16. Neck pain with numbness/tingling in the arms, hands, and low back with proximal right leg weakness. An MRI of the cervical spine 05/30/2009 showed multilevel spondylosis with mild cord edema at C4-C5 and C5-C6. He was noted to have an enhancing lesion of the cord at T3-T4 of unclear etiology. In the lumbar spine there was no change in the spondylosis at L3-L4 and L4-L5. There was no involvement of the cervical or lumbar spine with myeloma. He is status post C4-C5, C5-C6, and C6-C7 anterior cervical diskectomy with fusion by Dr. Annette Stable 08/01/2009. 17. Thrombocytopenia-progressive, secondary to myelodysplasia and 5 azacytidine. 18. Indeterminate-age deep vein thrombosis of the left lower extremity on a venous Doppler 08/15/2009. There were also findings consistent with superficial thrombosis involving the right lower extremity 08/15/2009. Coumadin was discontinued in October 2011. 19. Type 2 diabetes. Now on an oral hypoglycemic 20. History of hematuria, followed at [facility]. Urinalysis was negative for blood on 07/23/2011. 21. Radicular back pain with MRI of the lumbar spine on 10/19/2011 showing nerve root impingement at L2-L3, L3-L4 and L4-L5 with the most severe at the right L3 nerve roots due to a large disc fragment. Associated right leg weakness. He underwent right L2-3 decompressive laminotomy with right L2 and L3 decompressive foraminotomy; right L2-3 microdiscectomy on 11/01/2011. 22. Constipation. He continues a laxative regimen. 23. Hospitalization 11/08/2011 through 11/10/2011 with orthostatic hypotension/dizziness. He improved with intravenous hydration.  He had mild hypotension when here on 11/18/2011. We recommended discontinuing Norvasc and Proscar. 24. Hypercalcemia status post pamidronate 11/03/2011. Recurrent hypercalcemia 02/16/2012 status post Zometa. The calcium has remained in normal range. 25. Fractured tooth with associated pain. Status post a tooth extraction by Dr. Enrique Sack, Zometa was placed on hold. 26. Exertional dyspnea. Stable. He was referred to cardiology, pulmonary function studies 06/15/2013 showed a moderately severe diffusion defect. He utilizes supplemental oxygen as needed. He is followed by pulmonary. 27. 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 28. 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   Status post cycle 1 5-azacytidine beginning 07/08/2014 4 days (day 5 held).  Cycle 2 5-azacytidine beginning 08/05/2014 5 days with Neulasta support.  Cycle 3 5-azacytidine beginning 09/02/2014 with Neulasta support.  Cycle 4 5 azacytidine beginning 09/30/2014 with Neulasta support  Cycle 5 5-azacytidine beginning 10/28/2014 with Neulasta support  Cycle 6 5-azacytidine beginning 11/25/2014 with Neulasta support  Cycle 7 5-azacytidine beginning 12/23/2014 with Neulasta support 29. History of hypotension. Blood pressure medication placed on hold December 2015.   Disposition: Mr. Hailes appears stable. He has competed 6 cycles of 5-azacytidine. Plan to proceed with cycle 7 today as scheduled.   He has persistent mild neutropenia. He is mildly anemic and thrombocytopenic. Question due to MDS, 5-azacytidine, multiple myeloma. We will follow-up on the serum SPEP and IgG from today. He will return for a follow-up CBC in 2 weeks. He understands to contact the office  with fever and or bleeding.  He will return for a follow-up visit and cycle 8 5-azacytidine in 4 weeks. He will contact  the office in the interim as outlined above or with any other problems.  Plan reviewed with Dr. Benay Spice.    Ned Card ANP/GNP-BC   12/23/2014  12:17 PM

## 2014-12-23 NOTE — Telephone Encounter (Signed)
Gave adn printed appt sched and avs fo rpt for June and July  °

## 2014-12-23 NOTE — Patient Instructions (Signed)
Azacitidine suspension for injection (subcutaneous use) What is this medicine? AZACITIDINE (ay [place]) is a chemotherapy drug. This medicine reduces the growth of cancer cells and can suppress the immune system. It is used for treating myelodysplastic syndrome or some types of leukemia. This medicine may be used for other purposes; ask your health care provider or pharmacist if you have questions. COMMON BRAND NAME(S): Vidaza What should I tell my health care provider before I take this medicine? They need to know if you have any of these conditions: -infection (especially a virus infection such as chickenpox, cold sores, or herpes) -kidney disease -liver disease -liver tumors -an unusual or allergic reaction to azacitidine, mannitol, other medicines, foods, dyes, or preservatives -pregnant or trying to get pregnant -breast-feeding How should I use this medicine? This medicine is for injection under the skin. It is administered in a hospital or clinic by a specially trained health care professional. Talk to your pediatrician regarding the use of this medicine in children. While this drug may be prescribed for selected conditions, precautions do apply. Overdosage: If you think you have taken too much of this medicine contact a poison control center or emergency room at once. NOTE: This medicine is only for you. Do not share this medicine with others. What if I miss a dose? It is important not to miss your dose. Call your doctor or health care professional if you are unable to keep an appointment. What may interact with this medicine? -vaccines Talk to your doctor or health care professional before taking any of these medicines: -acetaminophen -aspirin -ibuprofen -ketoprofen -naproxen This list may not describe all possible interactions. Give your health care provider a list of all the medicines, herbs, non-prescription drugs, or dietary supplements you use. Also tell them if you  smoke, drink alcohol, or use illegal drugs. Some items may interact with your medicine. What should I watch for while using this medicine? Visit your doctor for checks on your progress. This drug may make you feel generally unwell. This is not uncommon, as chemotherapy can affect healthy cells as well as cancer cells. Report any side effects. Continue your course of treatment even though you feel ill unless your doctor tells you to stop. In some cases, you may be given additional medicines to help with side effects. Follow all directions for their use. Call your doctor or health care professional for advice if you get a fever, chills or sore throat, or other symptoms of a cold or flu. Do not treat yourself. This drug decreases your body's ability to fight infections. Try to avoid being around people who are sick. This medicine may increase your risk to bruise or bleed. Call your doctor or health care professional if you notice any unusual bleeding. Be careful brushing and flossing your teeth or using a toothpick because you may get an infection or bleed more easily. If you have any dental work done, tell your dentist you are receiving this medicine. Avoid taking products that contain aspirin, acetaminophen, ibuprofen, naproxen, or ketoprofen unless instructed by your doctor. These medicines may hide a fever. Do not have any vaccinations without your doctor's approval and avoid anyone who has recently had oral polio vaccine. Do not become pregnant while taking this medicine. Women should inform their doctor if they wish to become pregnant or think they might be pregnant. There is a potential for serious side effects to an unborn child. Talk to your health care professional or pharmacist for more information.  Do not breast-feed an infant while taking this medicine. If you are a man, you should not father a child while receiving treatment. What side effects may I notice from receiving this medicine? Side  effects that you should report to your doctor or health care professional as soon as possible: -allergic reactions like skin rash, itching or hives, swelling of the face, lips, or tongue -low blood counts - this medicine may decrease the number of white blood cells, red blood cells and platelets. You may be at increased risk for infections and bleeding. -signs of infection - fever or chills, cough, sore throat, pain or difficulty passing urine -signs of decreased platelets or bleeding - bruising, pinpoint red spots on the skin, black, tarry stools, blood in the urine -signs of decreased red blood cells - unusually weak or tired, fainting spells, lightheadedness -reactions at the injection site including redness, pain, itching, or bruising -breathing problems -changes in vision -fever -mouth sores -stomach pain -vomiting Side effects that usually do not require medical attention (report to your doctor or health care professional if they continue or are bothersome): -constipation -diarrhea -loss of appetite -nausea -pain or redness at the injection site -weak or tired This list may not describe all possible side effects. Call your doctor for medical advice about side effects. You may report side effects to FDA at 1-800-FDA-1088. Where should I keep my medicine? This drug is given in a hospital or clinic and will not be stored at home. NOTE: This sheet is a summary. It may not cover all possible information. If you have questions about this medicine, talk to your doctor, pharmacist, or health care provider.  2015, [employer]. (2007-09-21 11:04:07)

## 2014-12-24 ENCOUNTER — Ambulatory Visit (HOSPITAL_BASED_OUTPATIENT_CLINIC_OR_DEPARTMENT_OTHER): Payer: Medicare HMO

## 2014-12-24 VITALS — BP 129/69 | HR 75 | Temp 98.2°F | Resp 18

## 2014-12-24 DIAGNOSIS — Z5111 Encounter for antineoplastic chemotherapy: Secondary | ICD-10-CM

## 2014-12-24 DIAGNOSIS — C9000 Multiple myeloma not having achieved remission: Secondary | ICD-10-CM

## 2014-12-24 DIAGNOSIS — D469 Myelodysplastic syndrome, unspecified: Secondary | ICD-10-CM

## 2014-12-24 MED ORDER — ONDANSETRON HCL 8 MG PO TABS
ORAL_TABLET | ORAL | Status: AC
  Filled 2014-12-24: qty 1

## 2014-12-24 MED ORDER — ONDANSETRON HCL 8 MG PO TABS
8.0000 mg | ORAL_TABLET | Freq: Once | ORAL | Status: AC
  Administered 2014-12-24: 8 mg via ORAL

## 2014-12-24 MED ORDER — AZACITIDINE CHEMO SQ INJECTION
180.0000 mg | Freq: Once | INTRAMUSCULAR | Status: AC
  Administered 2014-12-24: 180 mg via SUBCUTANEOUS
  Filled 2014-12-24: qty 7.2

## 2014-12-24 NOTE — Patient Instructions (Signed)
[place] Discharge Instructions for Patients Receiving Chemotherapy  Today you received the following chemotherapy agents: Vidaza.  To help prevent nausea and vomiting after your treatment, we encourage you to take your nausea medication: Compazine 10 mg every 6 hours as needed.   If you develop nausea and vomiting that is not controlled by your nausea medication, call the clinic.   BELOW ARE SYMPTOMS THAT SHOULD BE REPORTED IMMEDIATELY:  *FEVER GREATER THAN 100.5 F  *CHILLS WITH OR WITHOUT FEVER  NAUSEA AND VOMITING THAT IS NOT CONTROLLED WITH YOUR NAUSEA MEDICATION  *UNUSUAL SHORTNESS OF BREATH  *UNUSUAL BRUISING OR BLEEDING  TENDERNESS IN MOUTH AND THROAT WITH OR WITHOUT PRESENCE OF ULCERS  *URINARY PROBLEMS  *BOWEL PROBLEMS  UNUSUAL RASH Items with * indicate a potential emergency and should be followed up as soon as possible.  Feel free to call the clinic you have any questions or concerns. The clinic phone number is [phone].  Please show the [place] at check-in to the Emergency Department and triage nurse.

## 2014-12-25 ENCOUNTER — Ambulatory Visit (HOSPITAL_BASED_OUTPATIENT_CLINIC_OR_DEPARTMENT_OTHER): Payer: Medicare HMO

## 2014-12-25 VITALS — BP 118/66 | HR 83 | Temp 98.5°F | Resp 18

## 2014-12-25 DIAGNOSIS — C9000 Multiple myeloma not having achieved remission: Secondary | ICD-10-CM

## 2014-12-25 DIAGNOSIS — D469 Myelodysplastic syndrome, unspecified: Secondary | ICD-10-CM | POA: Diagnosis not present

## 2014-12-25 DIAGNOSIS — Z5111 Encounter for antineoplastic chemotherapy: Secondary | ICD-10-CM | POA: Diagnosis not present

## 2014-12-25 LAB — PROTEIN ELECTROPHORESIS, SERUM
ABNORMAL PROTEIN BAND1: 3.3 g/dL
ALPHA-2-GLOBULIN: 0.6 g/dL (ref 0.5–0.9)
Albumin ELP: 3.6 g/dL — ABNORMAL LOW (ref 3.8–4.8)
Alpha-1-Globulin: 0.3 g/dL (ref 0.2–0.3)
BETA 2: 0.3 g/dL (ref 0.2–0.5)
BETA GLOBULIN: 0.3 g/dL — AB (ref 0.4–0.6)
GAMMA GLOBULIN: 3.7 g/dL — AB (ref 0.8–1.7)
Total Protein, Serum Electrophoresis: 8.7 g/dL — ABNORMAL HIGH (ref 6.1–8.1)

## 2014-12-25 LAB — IGG: IgG (Immunoglobin G), Serum: 3510 mg/dL — ABNORMAL HIGH (ref 650–1600)

## 2014-12-25 MED ORDER — ONDANSETRON HCL 8 MG PO TABS
ORAL_TABLET | ORAL | Status: AC
  Filled 2014-12-25: qty 1

## 2014-12-25 MED ORDER — AZACITIDINE CHEMO SQ INJECTION
76.0000 mg/m2 | Freq: Once | INTRAMUSCULAR | Status: AC
  Administered 2014-12-25: 180 mg via SUBCUTANEOUS
  Filled 2014-12-25: qty 7.2

## 2014-12-25 MED ORDER — ONDANSETRON HCL 8 MG PO TABS
8.0000 mg | ORAL_TABLET | Freq: Once | ORAL | Status: AC
  Administered 2014-12-25: 8 mg via ORAL

## 2014-12-25 NOTE — Patient Instructions (Signed)
[facility] Discharge Instructions for Patients Receiving Chemotherapy  Today you received the following chemotherapy agents Vidaza  To help prevent nausea and vomiting after your treatment, we encourage you to take your nausea medication as prescribed.    If you develop nausea and vomiting that is not controlled by your nausea medication, call the clinic.   BELOW ARE SYMPTOMS THAT SHOULD BE REPORTED IMMEDIATELY:  *FEVER GREATER THAN 100.5 F  *CHILLS WITH OR WITHOUT FEVER  NAUSEA AND VOMITING THAT IS NOT CONTROLLED WITH YOUR NAUSEA MEDICATION  *UNUSUAL SHORTNESS OF BREATH  *UNUSUAL BRUISING OR BLEEDING  TENDERNESS IN MOUTH AND THROAT WITH OR WITHOUT PRESENCE OF ULCERS  *URINARY PROBLEMS  *BOWEL PROBLEMS  UNUSUAL RASH Items with * indicate a potential emergency and should be followed up as soon as possible.  Feel free to call the clinic you have any questions or concerns. The clinic phone number is [phone].  Please show the CHEMO ALERT CARD at check-in to the Emergency Department and triage nurse.   

## 2014-12-26 ENCOUNTER — Ambulatory Visit (HOSPITAL_BASED_OUTPATIENT_CLINIC_OR_DEPARTMENT_OTHER): Payer: Medicare HMO

## 2014-12-26 VITALS — BP 109/67 | HR 81 | Temp 98.6°F | Resp 20

## 2014-12-26 DIAGNOSIS — Z5111 Encounter for antineoplastic chemotherapy: Secondary | ICD-10-CM

## 2014-12-26 DIAGNOSIS — D469 Myelodysplastic syndrome, unspecified: Secondary | ICD-10-CM | POA: Diagnosis not present

## 2014-12-26 DIAGNOSIS — C9000 Multiple myeloma not having achieved remission: Secondary | ICD-10-CM

## 2014-12-26 MED ORDER — ONDANSETRON HCL 8 MG PO TABS
8.0000 mg | ORAL_TABLET | Freq: Once | ORAL | Status: AC
  Administered 2014-12-26: 8 mg via ORAL

## 2014-12-26 MED ORDER — AZACITIDINE CHEMO SQ INJECTION
76.0000 mg/m2 | Freq: Once | INTRAMUSCULAR | Status: AC
  Administered 2014-12-26: 180 mg via SUBCUTANEOUS
  Filled 2014-12-26: qty 7.2

## 2014-12-26 MED ORDER — ONDANSETRON HCL 8 MG PO TABS
ORAL_TABLET | ORAL | Status: AC
  Filled 2014-12-26: qty 1

## 2014-12-26 NOTE — Patient Instructions (Signed)
[place] Discharge Instructions for Patients Receiving Chemotherapy  Today you received the following chemotherapy agents :  vidaxa  To help prevent nausea and vomiting after your treatment, we encourage you to take your nausea medication.   If you develop nausea and vomiting that is not controlled by your nausea medication, call the clinic.   BELOW ARE SYMPTOMS THAT SHOULD BE REPORTED IMMEDIATELY:  *FEVER GREATER THAN 100.5 F  *CHILLS WITH OR WITHOUT FEVER  NAUSEA AND VOMITING THAT IS NOT CONTROLLED WITH YOUR NAUSEA MEDICATION  *UNUSUAL SHORTNESS OF BREATH  *UNUSUAL BRUISING OR BLEEDING  TENDERNESS IN MOUTH AND THROAT WITH OR WITHOUT PRESENCE OF ULCERS  *URINARY PROBLEMS  *BOWEL PROBLEMS  UNUSUAL RASH Items with * indicate a potential emergency and should be followed up as soon as possible.  Feel free to call the clinic you have any questions or concerns. The clinic phone number is [phone].  Please show the [place] at check-in to the Emergency Department and triage nurse.

## 2014-12-27 ENCOUNTER — Ambulatory Visit (HOSPITAL_BASED_OUTPATIENT_CLINIC_OR_DEPARTMENT_OTHER): Payer: Medicare HMO

## 2014-12-27 VITALS — BP 120/73 | HR 97 | Temp 97.5°F

## 2014-12-27 DIAGNOSIS — Z5111 Encounter for antineoplastic chemotherapy: Secondary | ICD-10-CM

## 2014-12-27 DIAGNOSIS — D469 Myelodysplastic syndrome, unspecified: Secondary | ICD-10-CM | POA: Diagnosis not present

## 2014-12-27 DIAGNOSIS — C9000 Multiple myeloma not having achieved remission: Secondary | ICD-10-CM

## 2014-12-27 MED ORDER — ONDANSETRON HCL 8 MG PO TABS
ORAL_TABLET | ORAL | Status: AC
  Filled 2014-12-27: qty 1

## 2014-12-27 MED ORDER — AZACITIDINE CHEMO SQ INJECTION
180.0000 mg | Freq: Once | INTRAMUSCULAR | Status: AC
  Administered 2014-12-27: 180 mg via SUBCUTANEOUS
  Filled 2014-12-27: qty 7.2

## 2014-12-27 MED ORDER — ONDANSETRON HCL 8 MG PO TABS
8.0000 mg | ORAL_TABLET | Freq: Once | ORAL | Status: AC
  Administered 2014-12-27: 8 mg via ORAL

## 2014-12-27 NOTE — Progress Notes (Signed)
Per Awilda Metro, PAC ok to proceed with chemo without required urine output.

## 2014-12-27 NOTE — Patient Instructions (Signed)
[facility] Discharge Instructions for Patients Receiving Chemotherapy  Today you received the following chemotherapy agents vidaza   To help prevent nausea and vomiting after your treatment, we encourage you to take your nausea medication as directed. If you develop nausea and vomiting that is not controlled by your nausea medication, call the clinic.   BELOW ARE SYMPTOMS THAT SHOULD BE REPORTED IMMEDIATELY:  *FEVER GREATER THAN 100.5 F  *CHILLS WITH OR WITHOUT FEVER  NAUSEA AND VOMITING THAT IS NOT CONTROLLED WITH YOUR NAUSEA MEDICATION  *UNUSUAL SHORTNESS OF BREATH  *UNUSUAL BRUISING OR BLEEDING  TENDERNESS IN MOUTH AND THROAT WITH OR WITHOUT PRESENCE OF ULCERS  *URINARY PROBLEMS  *BOWEL PROBLEMS  UNUSUAL RASH Items with * indicate a potential emergency and should be followed up as soon as possible.  Feel free to call the clinic you have any questions or concerns. The clinic phone number is [phone].  

## 2014-12-28 ENCOUNTER — Ambulatory Visit (HOSPITAL_BASED_OUTPATIENT_CLINIC_OR_DEPARTMENT_OTHER): Payer: Medicare HMO

## 2014-12-28 VITALS — BP 131/67 | HR 112 | Temp 97.0°F | Resp 22

## 2014-12-28 DIAGNOSIS — Z5189 Encounter for other specified aftercare: Secondary | ICD-10-CM

## 2014-12-28 DIAGNOSIS — D469 Myelodysplastic syndrome, unspecified: Secondary | ICD-10-CM

## 2014-12-28 MED ORDER — PEGFILGRASTIM INJECTION 6 MG/0.6ML
6.0000 mg | Freq: Once | SUBCUTANEOUS | Status: AC
  Administered 2014-12-28: 6 mg via SUBCUTANEOUS

## 2014-12-31 ENCOUNTER — Telehealth: Payer: Self-pay | Admitting: Oncology

## 2014-12-31 ENCOUNTER — Other Ambulatory Visit (HOSPITAL_BASED_OUTPATIENT_CLINIC_OR_DEPARTMENT_OTHER): Payer: Medicare HMO

## 2014-12-31 DIAGNOSIS — D469 Myelodysplastic syndrome, unspecified: Secondary | ICD-10-CM | POA: Diagnosis not present

## 2014-12-31 LAB — CBC WITH DIFFERENTIAL/PLATELET
BASO%: 0.7 % (ref 0.0–2.0)
Basophils Absolute: 0.1 10*3/uL (ref 0.0–0.1)
EOS ABS: 0.2 10*3/uL (ref 0.0–0.5)
EOS%: 2.2 % (ref 0.0–7.0)
HCT: 34.6 % — ABNORMAL LOW (ref 38.4–49.9)
HGB: 11.8 g/dL — ABNORMAL LOW (ref 13.0–17.1)
LYMPH#: 2.5 10*3/uL (ref 0.9–3.3)
LYMPH%: 36.3 % (ref 14.0–49.0)
MCH: 30.4 pg (ref 27.2–33.4)
MCHC: 34.1 g/dL (ref 32.0–36.0)
MCV: 89.2 fL (ref 79.3–98.0)
MONO#: 0.3 10*3/uL (ref 0.1–0.9)
MONO%: 4.6 % (ref 0.0–14.0)
NEUT#: 3.9 10*3/uL (ref 1.5–6.5)
NEUT%: 56.2 % (ref 39.0–75.0)
Platelets: 98 10*3/uL — ABNORMAL LOW (ref 140–400)
RBC: 3.87 10*6/uL — AB (ref 4.20–5.82)
RDW: 24.2 % — AB (ref 11.0–14.6)
WBC: 7 10*3/uL (ref 4.0–10.3)

## 2014-12-31 NOTE — Telephone Encounter (Signed)
Gave adn printed appt sched pt came in a week early for lab.

## 2015-01-07 ENCOUNTER — Other Ambulatory Visit: Payer: Self-pay

## 2015-01-13 ENCOUNTER — Other Ambulatory Visit: Payer: Self-pay | Admitting: Oncology

## 2015-01-20 ENCOUNTER — Ambulatory Visit: Payer: Medicare HMO

## 2015-01-20 ENCOUNTER — Other Ambulatory Visit (HOSPITAL_BASED_OUTPATIENT_CLINIC_OR_DEPARTMENT_OTHER): Payer: Medicare HMO

## 2015-01-20 ENCOUNTER — Ambulatory Visit (HOSPITAL_BASED_OUTPATIENT_CLINIC_OR_DEPARTMENT_OTHER): Payer: Medicare HMO | Admitting: Nurse Practitioner

## 2015-01-20 ENCOUNTER — Telehealth: Payer: Self-pay | Admitting: Nurse Practitioner

## 2015-01-20 VITALS — BP 130/72 | HR 90 | Temp 97.4°F | Resp 18 | Ht 72.0 in | Wt 230.3 lb

## 2015-01-20 DIAGNOSIS — D469 Myelodysplastic syndrome, unspecified: Secondary | ICD-10-CM

## 2015-01-20 DIAGNOSIS — D630 Anemia in neoplastic disease: Secondary | ICD-10-CM | POA: Diagnosis not present

## 2015-01-20 DIAGNOSIS — D61818 Other pancytopenia: Secondary | ICD-10-CM

## 2015-01-20 DIAGNOSIS — D6481 Anemia due to antineoplastic chemotherapy: Secondary | ICD-10-CM

## 2015-01-20 DIAGNOSIS — C9000 Multiple myeloma not having achieved remission: Secondary | ICD-10-CM | POA: Diagnosis not present

## 2015-01-20 LAB — COMPREHENSIVE METABOLIC PANEL (CC13)
ALBUMIN: 3 g/dL — AB (ref 3.5–5.0)
ALT: 8 U/L (ref 0–55)
ANION GAP: 5 meq/L (ref 3–11)
AST: 16 U/L (ref 5–34)
Alkaline Phosphatase: 44 U/L (ref 40–150)
BUN: 11.2 mg/dL (ref 7.0–26.0)
CALCIUM: 9 mg/dL (ref 8.4–10.4)
CO2: 23 mEq/L (ref 22–29)
Chloride: 107 mEq/L (ref 98–109)
Creatinine: 1.1 mg/dL (ref 0.7–1.3)
EGFR: 74 mL/min/{1.73_m2} — ABNORMAL LOW (ref >90)
Glucose: 94 mg/dl (ref 70–140)
Potassium: 3.9 mEq/L (ref 3.5–5.1)
Sodium: 135 mEq/L — ABNORMAL LOW (ref 136–145)
Total Bilirubin: 0.46 mg/dL (ref 0.20–1.20)
Total Protein: 9.1 g/dL — ABNORMAL HIGH (ref 6.4–8.3)

## 2015-01-20 LAB — CBC WITH DIFFERENTIAL/PLATELET
BASO%: 0.7 % (ref 0.0–2.0)
Basophils Absolute: 0 10*3/uL (ref 0.0–0.1)
EOS%: 2.3 % (ref 0.0–7.0)
Eosinophils Absolute: 0.1 10*3/uL (ref 0.0–0.5)
HEMATOCRIT: 32.7 % — AB (ref 38.4–49.9)
HEMOGLOBIN: 11.2 g/dL — AB (ref 13.0–17.1)
LYMPH%: 82.9 % — ABNORMAL HIGH (ref 14.0–49.0)
MCH: 31.4 pg (ref 27.2–33.4)
MCHC: 34.2 g/dL (ref 32.0–36.0)
MCV: 91.8 fL (ref 79.3–98.0)
MONO#: 0.1 10*3/uL (ref 0.1–0.9)
MONO%: 2.3 % (ref 0.0–14.0)
NEUT#: 0.3 10*3/uL — CL (ref 1.5–6.5)
NEUT%: 11.8 % — AB (ref 39.0–75.0)
PLATELETS: 66 10*3/uL — AB (ref 140–400)
RBC: 3.56 10*6/uL — ABNORMAL LOW (ref 4.20–5.82)
RDW: 25.8 % — AB (ref 11.0–14.6)
WBC: 2.3 10*3/uL — AB (ref 4.0–10.3)
lymph#: 1.9 10*3/uL (ref 0.9–3.3)

## 2015-01-20 NOTE — Telephone Encounter (Signed)
Pt confirmed labs/ov per 07/11 POF, gave pt AVS and Calendar.... KJ °

## 2015-01-20 NOTE — Progress Notes (Signed)
[place] OFFICE PROGRESS NOTE   Diagnosis:  Myelodysplasia, multiple myeloma  INTERVAL HISTORY:   [name] returns as scheduled. He completed cycle 7 5-azacytidine beginning 12/23/2014. He denies nausea/vomiting. No mouth sores. No diarrhea. He continues to have intermittent constipation. He takes Vicodin as needed. No fevers, chills or sweats. He denies any bleeding. Baseline dyspnea is stable to slightly worse. No cough.  Objective:  Vital signs in last 24 hours:  Blood pressure 130/72, pulse 90, temperature 97.4 F (36.3 C), temperature source Oral, resp. rate 18, height 6' (1.829 m), weight 230 lb 4.8 oz (104.463 kg), SpO2 97 %.    HEENT: No thrush or ulcers. Lymphatics: No palpable cervical or supraclavicular lymph nodes. Resp: Lungs clear bilaterally. Cardio: Regular rate and rhythm. GI: Abdomen soft and nontender. No organomegaly. Vascular: No leg edema.  Lab Results:  Lab Results  Component Value Date   WBC 2.3* 01/20/2015   HGB 11.2* 01/20/2015   HCT 32.7* 01/20/2015   MCV 91.8 01/20/2015   PLT 66* 01/20/2015   NEUTROABS 0.3* 01/20/2015    Imaging:  No results found.  Medications: I have reviewed the patient's current medications.  Assessment/Plan: 1. Multiple myeloma, status post treatment with melphalan/prednisone/thalidomide beginning in November 2009. The thalidomide was increased to 200 mg daily beginning 09/11/2008. The serum M spike was slightly improved on 03/18/2009 and slightly increased on 05/09/2009. He has been maintained off of specific therapy for multiple myeloma since September 2010. The serum M spike was increased on 10/20/2011 at 4.6. He began Revlimid on 11/11/2011 with weekly dexamethasone. He began cycle 2 of Revlimid on 12/09/2011. The serum M spike was improved on 12/31/2011. The serum M spike was slightly higher on 01/28/2012. He began cycle 4 on 02/03/2012. He developed recurrent hypercalcemia. Treatment was changed to  Cytoxan/Velcade/Decadron beginning 03/02/2012. Cytoxan discontinued in January 2015  Treatment continued with Velcade/Decadron.   Stable serum M spike and IgG 10/04/2013.   Stable serum M spike 12/06/2013   Treatment placed on hold 01/10/2014 secondary to thrombocytopenia and a slightly higher M spike   Serum M spike and IgG increased 03/07/2014.  Cycle 1 Pomalidomide/dexamethasone 04/01/2014.  Pomalidomide subsequently placed on hold due to cytopenias.  Pomalidomide resumed at a reduced dose 05/23/2014.  Pomalidomide placed on hold 06/04/2014 due to progressive pancytopenia. 2. Chronic "dizziness."  3. Chest x-ray 11/01/2008 with a patchy opacity at the right midlung suspicious for pneumonia. He was treated with Avelox. 4. Low back pain with a radicular component. An MRI on 10/07/2008 showed no involvement of the lumbosacral spine with myeloma. 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 5. Bilateral neck pain, status post a CT 05/17/2008 with findings of spinal stenosis and osteoarthritis. 6. 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 Red cell microcytosis, likely related to hemoglobin C trait. A ferritin level returned elevated and a stool Hemoccult was negative 01/23/2010. He underwent a colonoscopy in 2009. 8. Elevated beta-2 microglobulin level. 9. History of hypertension. Blood pressure medication placed on hold December 2015 due to hypotension. 10. History of a herniated disk at L4-L5 on MRI an 01/11/2002. 11. Hyperlipidemia. 12. Gastroesophageal reflux disease. 13. History of atypical angina. 14. History of rectal bleeding-large external hemorrhoids noted 02/25/2012. The stool was Hemoccult positive. He has a history of colon polyps. He is followed by [facility]. He was diagnosed with hemorrhoids in September of 2013  while hospitalized.  He underwent a band procedure 03/09/2013. He has had no further rectal bleeding. 15. Superficial venous thrombosis of the greater saphenous vein on a Doppler 09/27/2008. Negative for deep vein thrombosis. Symptoms improved with aspirin. 16. Neck pain with numbness/tingling in the arms, hands, and low back with proximal right leg weakness. An MRI of the cervical spine 05/30/2009 showed multilevel spondylosis with mild cord edema at C4-C5 and C5-C6. He was noted to have an enhancing lesion of the cord at T3-T4 of unclear etiology. In the lumbar spine there was no change in the spondylosis at L3-L4 and L4-L5. There was no involvement of the cervical or lumbar spine with myeloma. He is status post C4-C5, C5-C6, and C6-C7 anterior cervical diskectomy with fusion by Dr. Annette Stable 08/01/2009. 17. Thrombocytopenia-progressive, secondary to myelodysplasia and 5 azacytidine. 18. Indeterminate-age deep vein thrombosis of the left lower extremity on a venous Doppler 08/15/2009. There were also findings consistent with superficial thrombosis involving the right lower extremity 08/15/2009. Coumadin was discontinued in October 2011. 19. Type 2 diabetes. Now on an oral hypoglycemic 20. History of hematuria, followed at [facility]. Urinalysis was negative for blood on 07/23/2011. 21. Radicular back pain with MRI of the lumbar spine on 10/19/2011 showing nerve root impingement at L2-L3, L3-L4 and L4-L5 with the most severe at the right L3 nerve roots due to a large disc fragment. Associated right leg weakness. He underwent right L2-3 decompressive laminotomy with right L2 and L3 decompressive foraminotomy; right L2-3 microdiscectomy on 11/01/2011. 22. Constipation. He continues a laxative regimen. 23. Hospitalization 11/08/2011 through 11/10/2011 with orthostatic hypotension/dizziness. He improved with intravenous hydration. He had mild hypotension when here on 11/18/2011. We recommended discontinuing  Norvasc and Proscar. 24. Hypercalcemia status post pamidronate 11/03/2011. Recurrent hypercalcemia 02/16/2012 status post Zometa. The calcium has remained in normal range. 25. Fractured tooth with associated pain. Status post a tooth extraction by Dr. Enrique Sack, Zometa was placed on hold. 26. Exertional dyspnea. Stable. He was referred to cardiology, pulmonary function studies 06/15/2013 showed a moderately severe diffusion defect. He utilizes supplemental oxygen as needed. He is followed by pulmonary. 27. 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 28. 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   Status post cycle 1 5-azacytidine beginning 07/08/2014 4 days (day 5 held).  Cycle 2 5-azacytidine beginning 08/05/2014 5 days with Neulasta support.  Cycle 3 5-azacytidine beginning 09/02/2014 with Neulasta support.  Cycle 4 5 azacytidine beginning 09/30/2014 with Neulasta support  Cycle 5 5-azacytidine beginning 10/28/2014 with Neulasta support  Cycle 6 5-azacytidine beginning 11/25/2014 with Neulasta support  Cycle 7 5-azacytidine beginning 12/23/2014 with Neulasta support  Treatment placed on hold 01/20/2015 due to progressive pancytopenia. Referred for a bone marrow. 29. History of hypotension. Blood pressure medication placed on hold December 2015.   Disposition: Mr. Hughett has progressive pancytopenia. Dr. Benay Spice recommends placing the 5-azacytidine on hold and referring him for a bone marrow biopsy. He will return in one week for labs and 2 weeks for a follow-up visit. He will contact the office in the interim with any problems. We specifically discussed fever, chills, other signs of infection, bleeding.  Patient seen with  Dr. Benay Spice.    Ned Card ANP/GNP-BC   01/20/2015  12:40 PM

## 2015-01-21 ENCOUNTER — Ambulatory Visit: Payer: Self-pay

## 2015-01-22 ENCOUNTER — Other Ambulatory Visit: Payer: Self-pay | Admitting: Radiology

## 2015-01-22 ENCOUNTER — Ambulatory Visit: Payer: Self-pay

## 2015-01-22 LAB — PROTEIN ELECTROPHORESIS, SERUM
ABNORMAL PROTEIN BAND1: 3.3 g/dL
ALPHA-1-GLOBULIN: 0.3 g/dL (ref 0.2–0.3)
Albumin ELP: 3.6 g/dL — ABNORMAL LOW (ref 3.8–4.8)
Alpha-2-Globulin: 0.7 g/dL (ref 0.5–0.9)
Beta 2: 0.3 g/dL (ref 0.2–0.5)
Beta Globulin: 0.3 g/dL — ABNORMAL LOW (ref 0.4–0.6)
Gamma Globulin: 3.6 g/dL — ABNORMAL HIGH (ref 0.8–1.7)
Total Protein, Serum Electrophoresis: 8.7 g/dL — ABNORMAL HIGH (ref 6.1–8.1)

## 2015-01-22 LAB — IGG: IgG (Immunoglobin G), Serum: 3920 mg/dL — ABNORMAL HIGH (ref 650–1600)

## 2015-01-23 ENCOUNTER — Ambulatory Visit (HOSPITAL_COMMUNITY)
Admission: RE | Admit: 2015-01-23 | Discharge: 2015-01-23 | Disposition: A | Discharge status: Home / Self Care | Payer: Medicare HMO | Source: Ambulatory Visit | Attending: Nurse Practitioner | Admitting: Nurse Practitioner

## 2015-01-23 ENCOUNTER — Other Ambulatory Visit: Payer: Self-pay | Admitting: Family

## 2015-01-23 ENCOUNTER — Ambulatory Visit: Payer: Self-pay

## 2015-01-23 ENCOUNTER — Encounter (HOSPITAL_COMMUNITY): Payer: Self-pay

## 2015-01-23 DIAGNOSIS — C9000 Multiple myeloma not having achieved remission: Secondary | ICD-10-CM | POA: Insufficient documentation

## 2015-01-23 DIAGNOSIS — D469 Myelodysplastic syndrome, unspecified: Secondary | ICD-10-CM

## 2015-01-23 DIAGNOSIS — D61818 Other pancytopenia: Secondary | ICD-10-CM | POA: Diagnosis not present

## 2015-01-23 LAB — CBC
HCT: 31.3 % — ABNORMAL LOW (ref 39.0–52.0)
HEMOGLOBIN: 11.5 g/dL — AB (ref 13.0–17.0)
MCH: 31.5 pg (ref 26.0–34.0)
MCHC: 36.7 g/dL — ABNORMAL HIGH (ref 30.0–36.0)
MCV: 85.8 fL (ref 78.0–100.0)
PLATELETS: 55 10*3/uL — AB (ref 150–400)
RBC: 3.65 MIL/uL — AB (ref 4.22–5.81)
RDW: 23.1 % — AB (ref 11.5–15.5)
WBC: 2.3 10*3/uL — AB (ref 4.0–10.5)

## 2015-01-23 LAB — GLUCOSE, CAPILLARY: GLUCOSE-CAPILLARY: 84 mg/dL (ref 65–99)

## 2015-01-23 LAB — APTT: aPTT: 26 seconds (ref 24–37)

## 2015-01-23 LAB — PROTIME-INR
INR: 1.12 (ref 0.00–1.49)
Prothrombin Time: 14.6 seconds (ref 11.6–15.2)

## 2015-01-23 MED ORDER — HYDROCODONE-ACETAMINOPHEN 5-325 MG PO TABS
1.0000 | ORAL_TABLET | ORAL | Status: DC | PRN
  Filled 2015-01-23: qty 2

## 2015-01-23 MED ORDER — MIDAZOLAM HCL 2 MG/2ML IJ SOLN
INTRAMUSCULAR | Status: AC | PRN
  Administered 2015-01-23: 1 mg via INTRAVENOUS
  Administered 2015-01-23: 0.5 mg via INTRAVENOUS
  Administered 2015-01-23: 1 mg via INTRAVENOUS

## 2015-01-23 MED ORDER — FENTANYL CITRATE (PF) 100 MCG/2ML IJ SOLN
INTRAMUSCULAR | Status: AC | PRN
  Administered 2015-01-23: 50 ug via INTRAVENOUS

## 2015-01-23 MED ORDER — FENTANYL CITRATE (PF) 100 MCG/2ML IJ SOLN
INTRAMUSCULAR | Status: AC
  Filled 2015-01-23: qty 4

## 2015-01-23 MED ORDER — MIDAZOLAM HCL 2 MG/2ML IJ SOLN
INTRAMUSCULAR | Status: AC
  Filled 2015-01-23: qty 6

## 2015-01-23 MED ORDER — SODIUM CHLORIDE 0.9 % IV SOLN
Freq: Once | INTRAVENOUS | Status: AC
  Administered 2015-01-23: 07:00:00 via INTRAVENOUS

## 2015-01-23 NOTE — H&P (Signed)
Chief Complaint: "I'm here for another bone marrow biopsy"  Referring Physician(s): Hawks,Christy A  History of Present Illness: [name] is a 79 y.o. male with history of multiple myeloma/myelodysplasia and now with progressive pancytopenia who presents today for CT guided bone marrow biopsy for further evaluation.   Past Medical History  Diagnosis Date  . GERD (gastroesophageal reflux disease)   . Essential hypertension, benign   . BPH (benign prostatic hyperplasia)   . Lumbar herniated disc     L4-5  . Hyperlipidemia   . Sleep apnea   . Multiple myeloma 02/19/2008  . Arthritis   . Diverticulosis   . Peptic ulcer disease   . Tubular adenoma 02/16/2008    Dr. Silvano Rusk  . MDS (myelodysplastic syndrome) 06/17/2014  . Diabetes mellitus without complication     Past Surgical History  Procedure Laterality Date  . Neck surgery    . Lumbar laminectomy/decompression microdiscectomy  11/01/2011    Procedure: LUMBAR LAMINECTOMY/DECOMPRESSION MICRODISCECTOMY 2 LEVELS;  Surgeon: Charlie Pitter, MD;  Location: [facility];  Service: Neurosurgery;  Laterality: Right;  Lumbar Laminectomy/Microdiscectomy Decompression Lumbar Three-Four, Lumbar Four-Five   . Colonoscopy    . Hemorrhoid banding  2014  . Esophagogastroduodenoscopy      Allergies: Penicillins  Medications: Prior to Admission medications   Medication Sig Start Date End Date Taking? Authorizing Provider  acetaminophen (TYLENOL) 500 MG tablet Take 500 mg by mouth every 6 (six) hours as needed for moderate pain.   Yes Historical Provider, MD  acyclovir (ZOVIRAX) 400 MG tablet TAKE 1 TABLET (400 MG TOTAL) BY MOUTH 2 (TWO) TIMES DAILY. 07/30/14  Yes Ladell Pier, MD  canagliflozin (INVOKANA) 100 MG TABS tablet Take 1 tablet (100 mg total) by mouth daily. 05/21/14  Yes Michelle Bozovich, PHARMD  clonazePAM (KLONOPIN) 0.5 MG tablet TAKE ONE TO TWO TABLETS BY MOUTH 30 MINUTES BEFORE BEDTIME AS NEEDED 10/22/14  Yes  Clinton D Young, MD  glucose blood (ONETOUCH VERIO) test strip 1 each by Other route as needed for other. Use as instructed 08/21/14  Yes Sharion Balloon, FNP  KLOR-CON M20 20 MEQ tablet TAKE ONE TABLET BY MOUTH ONCE DAILY 11/26/14  Yes Sharion Balloon, FNP  lactulose (CHRONULAC) 10 GM/15ML solution Take 15 mLs (10 g total) by mouth 2 (two) times daily as needed. 12/23/14  Yes Owens Shark, NP  loratadine (CLARITIN) 10 MG tablet TAKE 1 TABLET (10 MG TOTAL) BY MOUTH DAILY. 09/18/14  Yes Sharion Balloon, FNP  metFORMIN (GLUCOPHAGE) 500 MG tablet TAKE 1 TABLET (500 MG TOTAL) BY MOUTH 2 (TWO) TIMES DAILY WITH A MEAL. 12/19/14  Yes Sharion Balloon, FNP  ONETOUCH DELICA LANCETS FINE MISC 1 each by Does not apply route daily. 08/21/14  Yes Sharion Balloon, FNP  pantoprazole (PROTONIX) 40 MG tablet TAKE 1 TABLET (40 MG TOTAL) BY MOUTH DAILY. 12/19/14  Yes Sharion Balloon, FNP  polyethylene glycol powder (GLYCOLAX/MIRALAX) powder TAKE 1-2 DOSES DAILY AS NEEDED 06/24/14  Yes Gatha Mayer, MD  prochlorperazine (COMPAZINE) 10 MG tablet TAKE 1 TABLET BY MOUTH EVERY SIX HOURS AS NEEDED   Yes Ladell Pier, MD  tamsulosin (FLOMAX) 0.4 MG CAPS capsule TAKE 1 CAPSULE (0.4 MG TOTAL) BY MOUTH 2 (TWO) TIMES DAILY. WITH A MEAL. 05/28/14  Yes Lysbeth Penner, FNP  traMADol (ULTRAM) 50 MG tablet Take 1 tablet (50 mg total) by mouth every 8 (eight) hours as needed. 08/05/14  Yes Ladell Pier,  MD  atorvastatin (LIPITOR) 20 MG tablet TAKE 1 TABLET (20 MG TOTAL) BY MOUTH DAILY. Patient not taking: Reported on 01/23/2015    Lysbeth Penner, FNP  diclofenac sodium (VOLTAREN) 1 % GEL Apply 2 g topically 4 (four) times daily. Patient not taking: Reported on 01/23/2015 09/13/14   Sharion Balloon, FNP  furosemide (LASIX) 20 MG tablet Take 1 tablet (20 mg total) by mouth as needed. Patient not taking: Reported on 01/23/2015 09/18/14   Herminio Commons, MD     Family History  Problem Relation Age of Onset  . Adopted: Yes     History   Social History  . Marital Status: Married    Spouse Name: N/A  . Number of Children: 4  . Years of Education: N/A   Occupational History  . retired-disabled-Construction    Social History Main Topics  . Smoking status: Former Smoker -- 2.00 packs/day for 20 years    Types: Cigarettes    Start date: 09/15/1962    Quit date: 07/12/1990  . Smokeless tobacco: Never Used  . Alcohol Use: 0.0 oz/week    0 Standard drinks or equivalent per week     Comment: "Very seldom"  . Drug Use: No  . Sexual Activity: No   Other Topics Concern  . None   Social History Narrative   Pt was adopted.   Married   Programmer, applications     Review of Systems  Constitutional: Positive for fatigue. Negative for fever and chills.  Respiratory: Positive for cough and shortness of breath.   Cardiovascular:       Occ chest "tightness"  Gastrointestinal: Positive for abdominal pain. Negative for nausea, vomiting and blood in stool.  Genitourinary: Negative for dysuria.  Musculoskeletal: Positive for back pain and arthralgias.       Some difficulty bearing weight on LE's  Neurological:       Occ HA's    Vital Signs: BP 121/78 mmHg  Pulse 74  Temp(Src) 97.6 F (36.4 C) (Oral)  Resp 16  SpO2 99%  Physical Exam  Constitutional: He is oriented to person, place, and time. He appears well-developed and well-nourished.  Cardiovascular: Normal rate and regular rhythm.   Pulmonary/Chest: Effort normal.  distant but clear BS bilat  Abdominal: Soft. Bowel sounds are normal. There is no tenderness.  Musculoskeletal: He exhibits edema.  Neurological: He is alert and oriented to person, place, and time.    Mallampati Score:     Imaging: No results found.  Labs:  CBC:  Recent Labs  12/23/14 1139 12/31/14 1339 01/20/15 1128 01/23/15 0720  WBC 3.0* 7.0 2.3* 2.3*  HGB 11.3* 11.8* 11.2* 11.5*  HCT 33.2* 34.6* 32.7* 31.3*  PLT 120* 98* 66* 55*    COAGS:  Recent Labs   06/17/14 0800 01/23/15 0720  INR 1.11 1.12  APTT 31 26    BMP:  Recent Labs  02/18/14 1449  05/21/14 1345  10/28/14 1110 11/25/14 1201 12/23/14 1139 01/20/15 1128  NA 140  < > 135  < > 136 137 138 135*  K 3.9  < > 4.2  < > 4.1 4.1 3.6 3.9  CL 102  --  96*  --   --   --   --   --   CO2 22  < > 19  < > 21* _0 GLUCOSE 123*  < > 157*  < > 96 89 83 94  BUN 12  < > 24  < >  12.4 11.6 10.8 11.2  CALCIUM 9.0  < > 8.9  < > 9.3 9.0 8.7 9.0  CREATININE 0.98  < > 1.24  < > 1.1 1.2 1.0 1.1  GFRNONAA 75  --  56*  --   --   --   --   --   GFRAA 86  --  65  --   --   --   --   --   < > = values in this interval not displayed.  LIVER FUNCTION TESTS:  Recent Labs  10/28/14 1110 11/25/14 1201 12/23/14 1139 01/20/15 1128  BILITOT 0.38 0.65 0.70 0.46  AST _0 ALT _1 ALKPHOS 48 50 44 44  PROT 8.9* 8.8* 8.7* 9.1*  ALBUMIN 3.1* 3.0* 2.9* 3.0*    TUMOR MARKERS: No results for input(s): AFPTM, CEA, CA199, CHROMGRNA in the last 8760 hours.  Assessment and Plan: [name] is a 79 y.o. male with history of multiple myeloma/myelodysplasia and now with progressive pancytopenia who presents today for CT guided bone marrow biopsy for further evaluation.Risks and benefits discussed with the patient/wife including, but not limited to bleeding, infection, damage to adjacent structures or low yield requiring additional tests.All of the patient's questions were answered, patient is agreeable to proceed.Consent signed and in chart.       Signed: D. Rowe Robert 01/23/2015, 8:30 AM   I spent a total of 30 minutes in face to face in clinical consultation, greater than 50% of which was counseling/coordinating care for CT guided bone marrow biopsy

## 2015-01-23 NOTE — Discharge Instructions (Signed)
Bone Marrow Aspiration, Bone Marrow Biopsy °Care After °Read the instructions outlined below and refer to this sheet in the next few weeks. These discharge instructions provide you with general information on caring for yourself after you leave the hospital. Your caregiver may also give you specific instructions. While your treatment has been planned according to the most current medical practices available, unavoidable complications occasionally occur. If you have any problems or questions after discharge, call your caregiver. °FINDING OUT THE RESULTS OF YOUR TEST °Not all test results are available during your visit. If your test results are not back during the visit, make an appointment with your caregiver to find out the results. Do not assume everything is normal if you have not heard from your caregiver or the medical facility. It is important for you to follow up on all of your test results.  °HOME CARE INSTRUCTIONS  °You have had sedation and may be sleepy or dizzy. Your thinking may not be as clear as usual. For the next 24 hours: °· Only take over-the-counter or prescription medicines for pain, discomfort, and or fever as directed by your caregiver. °· Do not drink alcohol. °· Do not smoke. °· Do not drive. °· Do not make important legal decisions. °· Do not operate heavy machinery. °· Do not care for small children by yourself. °· Keep your dressing clean and dry. You may replace dressing with a bandage after 24 hours. °· You may take a bath or shower after 24 hours. °· Use an ice pack for 20 minutes every 2 hours while awake for pain as needed. °SEEK MEDICAL CARE IF:  °· There is redness, swelling, or increasing pain at the biopsy site. °· There is pus coming from the biopsy site. °· There is drainage from a biopsy site lasting longer than one day. °· An unexplained oral temperature above 102° F (38.9° C) develops. °SEEK IMMEDIATE MEDICAL CARE IF:  °· You develop a rash. °· You have difficulty  breathing. °· You develop any reaction or side effects to medications given. °Document Released: 01/15/2005 Document Revised: 09/20/2011 Document Reviewed: 06/25/2008 °ExitCare® Patient Information ©2015 [employer]. This information is not intended to replace advice given to you by your health care provider. Make sure you discuss any questions you have with your health care provider. °Conscious Sedation °Sedation is the use of medicines to promote relaxation and relieve discomfort and anxiety. Conscious sedation is a type of sedation. Under conscious sedation you are less alert than normal but are still able to respond to instructions or stimulation. Conscious sedation is used during short medical and dental procedures. It is milder than deep sedation or general anesthesia and allows you to return to your regular activities sooner.  °LET YOUR HEALTH CARE PROVIDER KNOW ABOUT:  °· Any allergies you have. °· All medicines you are taking, including vitamins, herbs, eye drops, creams, and over-the-counter medicines. °· Use of steroids (by mouth or creams). °· Previous problems you or members of your family have had with the use of anesthetics. °· Any blood disorders you have. °· Previous surgeries you have had. °· Medical conditions you have. °· Possibility of pregnancy, if this applies. °· Use of cigarettes, alcohol, or illegal drugs. °RISKS AND COMPLICATIONS °Generally, this is a safe procedure. However, as with any procedure, problems can occur. Possible problems include: °· Oversedation. °· Trouble breathing on your own. You may need to have a breathing tube until you are awake and breathing on your own. °· Allergic reaction   to any of the medicines used for the procedure. °BEFORE THE PROCEDURE °· You may have blood tests done. These tests can help show how well your kidneys and liver are working. They can also show how well your blood clots. °· A physical exam will be done.   °· Only take medicines as directed by  your health care provider. You may need to stop taking medicines (such as blood thinners, aspirin, or nonsteroidal anti-inflammatory drugs) before the procedure.   °· Do not eat or drink at least 6 hours before the procedure or as directed by your health care provider. °· Arrange for a responsible adult, family member, or friend to take you home after the procedure. He or she should stay with you for at least 24 hours after the procedure, until the medicine has worn off. °PROCEDURE  °· An intravenous (IV) catheter will be inserted into one of your veins. Medicine will be able to flow directly into your body through this catheter. You may be given medicine through this tube to help prevent pain and help you relax. °· The medical or dental procedure will be done. °AFTER THE PROCEDURE °· You will stay in a recovery area until the medicine has worn off. Your blood pressure and pulse will be checked.   °·  Depending on the procedure you had, you may be allowed to go home when you can tolerate liquids and your pain is under control. °Document Released: 03/23/2001 Document Revised: 07/03/2013 Document Reviewed: 03/05/2013 °ExitCare® Patient Information ©2015 [employer]. This information is not intended to replace advice given to you by your health care provider. Make sure you discuss any questions you have with your health care provider. ° °

## 2015-01-23 NOTE — Procedures (Signed)
Interventional Radiology Procedure Note  Procedure: CT guided aspirate and core biopsy of right iliac bone Complications: None Recommendations: - Bedrest supine x 2 hrs - Hydrocodone PRN  Pain - Follow biopsy results  Signed,  Jaquez Farrington K. Dagon Budai, MD   

## 2015-01-24 ENCOUNTER — Ambulatory Visit: Payer: Self-pay

## 2015-01-24 LAB — BONE MARROW EXAM

## 2015-01-25 ENCOUNTER — Ambulatory Visit: Payer: Self-pay

## 2015-01-27 ENCOUNTER — Other Ambulatory Visit (HOSPITAL_BASED_OUTPATIENT_CLINIC_OR_DEPARTMENT_OTHER): Payer: Medicare HMO

## 2015-01-27 DIAGNOSIS — D469 Myelodysplastic syndrome, unspecified: Secondary | ICD-10-CM | POA: Diagnosis not present

## 2015-01-27 DIAGNOSIS — C9000 Multiple myeloma not having achieved remission: Secondary | ICD-10-CM

## 2015-01-27 LAB — CBC WITH DIFFERENTIAL/PLATELET
BASO%: 0.4 % (ref 0.0–2.0)
BASOS ABS: 0 10*3/uL (ref 0.0–0.1)
EOS%: 1.9 % (ref 0.0–7.0)
Eosinophils Absolute: 0.1 10*3/uL (ref 0.0–0.5)
HCT: 29.2 % — ABNORMAL LOW (ref 38.4–49.9)
HEMOGLOBIN: 10.7 g/dL — AB (ref 13.0–17.1)
LYMPH%: 82.1 % — AB (ref 14.0–49.0)
MCH: 31.9 pg (ref 27.2–33.4)
MCHC: 36.6 g/dL — AB (ref 32.0–36.0)
MCV: 87.2 fL (ref 79.3–98.0)
MONO#: 0.1 10*3/uL (ref 0.1–0.9)
MONO%: 5.3 % (ref 0.0–14.0)
NEUT%: 10.3 % — ABNORMAL LOW (ref 39.0–75.0)
NEUTROS ABS: 0.3 10*3/uL — AB (ref 1.5–6.5)
Platelets: 44 10*3/uL — ABNORMAL LOW (ref 140–400)
RBC: 3.35 10*6/uL — AB (ref 4.20–5.82)
RDW: 23.5 % — AB (ref 11.0–14.6)
WBC: 2.6 10*3/uL — AB (ref 4.0–10.3)
lymph#: 2.2 10*3/uL (ref 0.9–3.3)

## 2015-01-27 IMAGING — CR DG ABDOMEN ACUTE W/ 1V CHEST
4 series · 4 of 4 positions shown · non-contrast
Comparison: 08/31/2012

CLINICAL DATA: Rectal bleeding, shortness of breath

ACUTE ABDOMEN SERIES (ABDOMEN 2 VIEW & CHEST 1 VIEW)

[w chest pa]
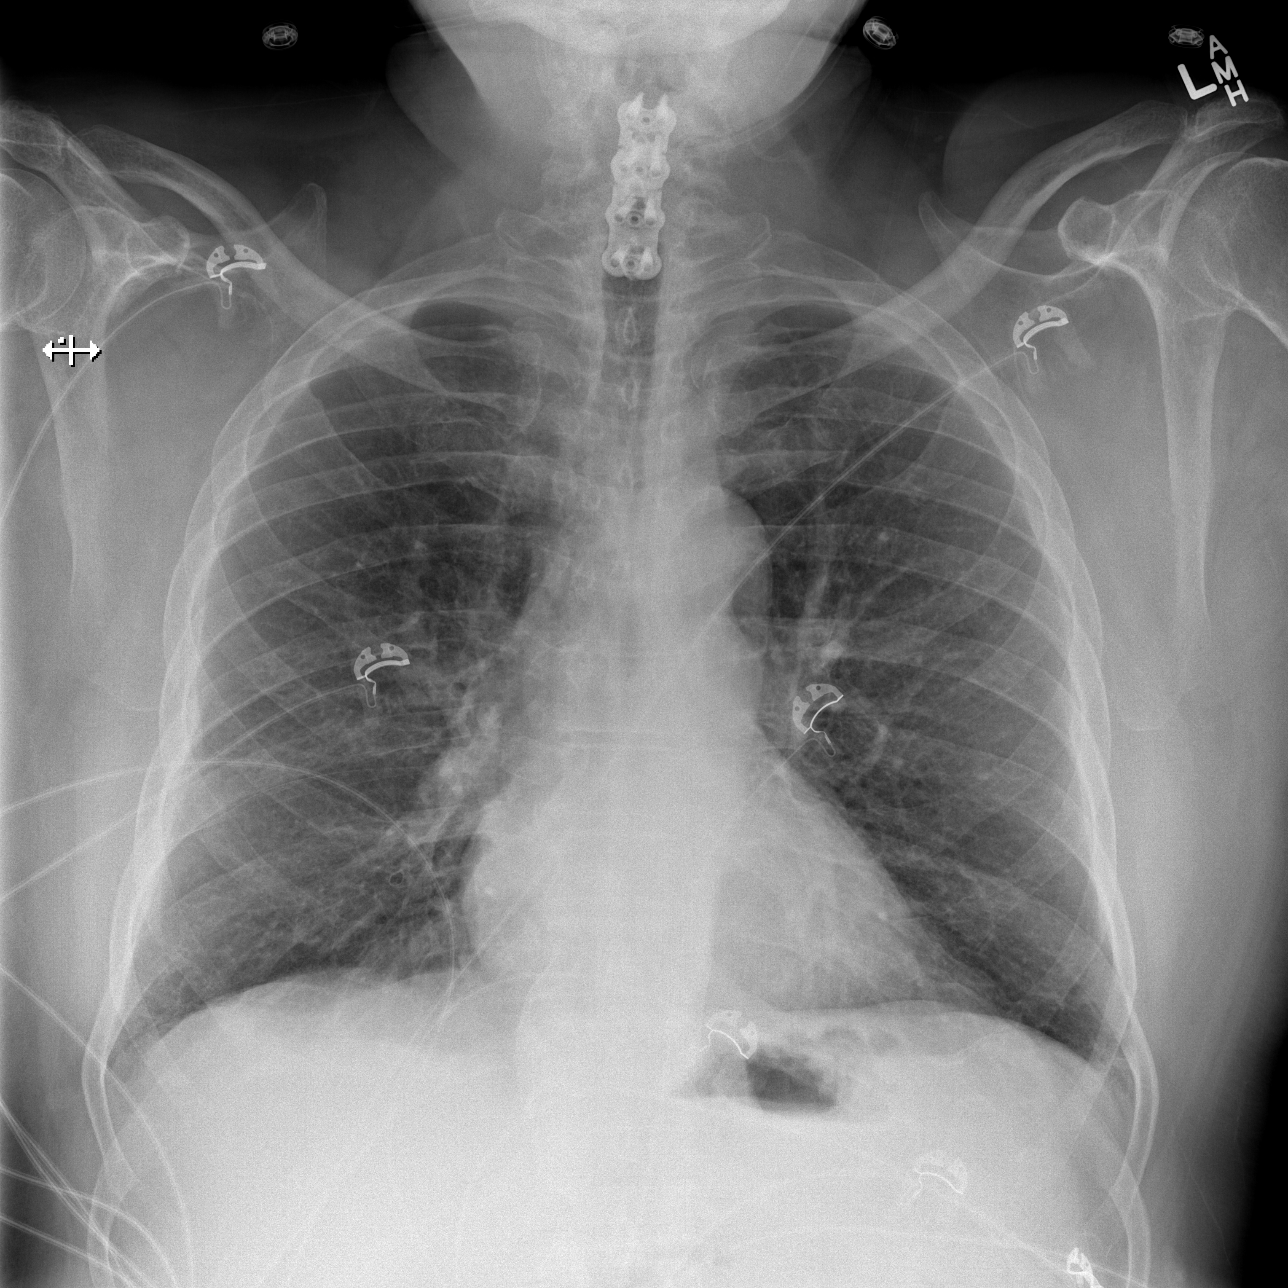

[w abdomen upright]
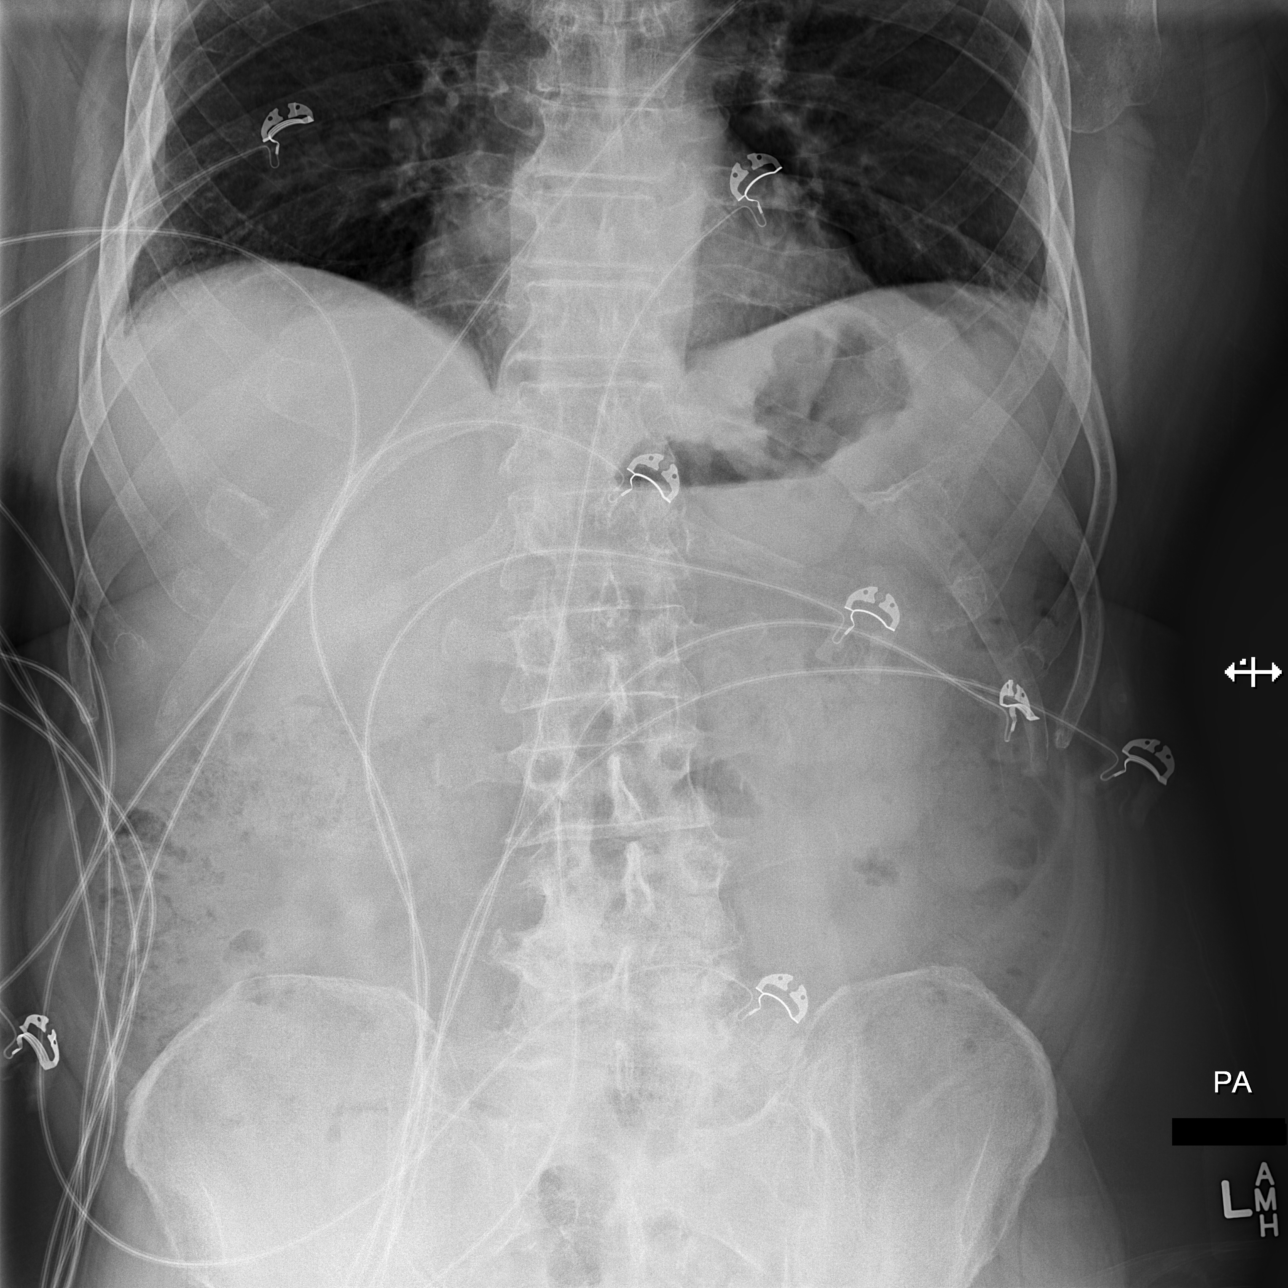

[t abdomen supine (1 of 2)]
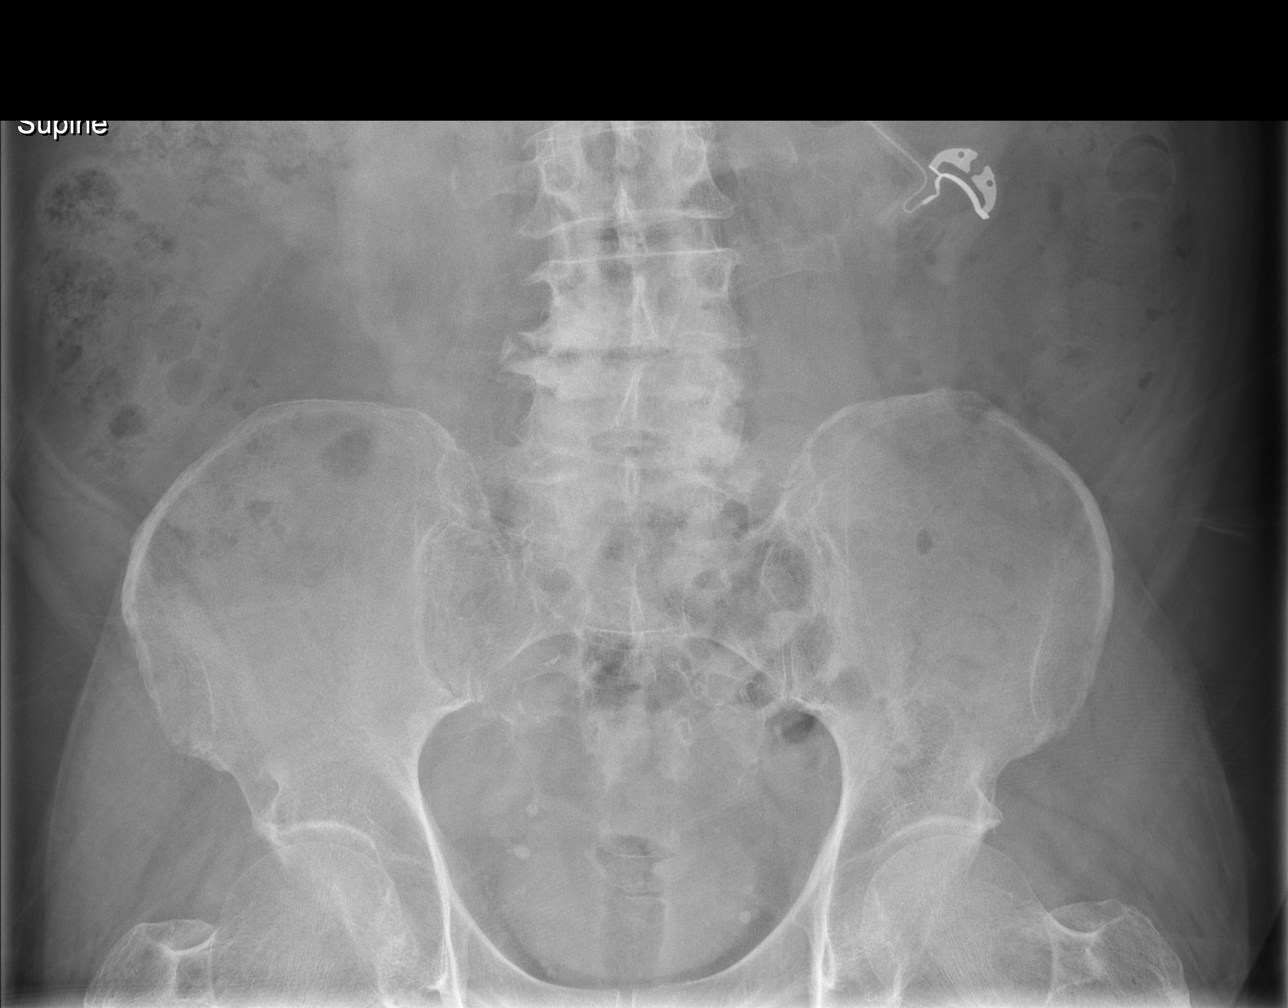

[t abdomen supine (2 of 2)]
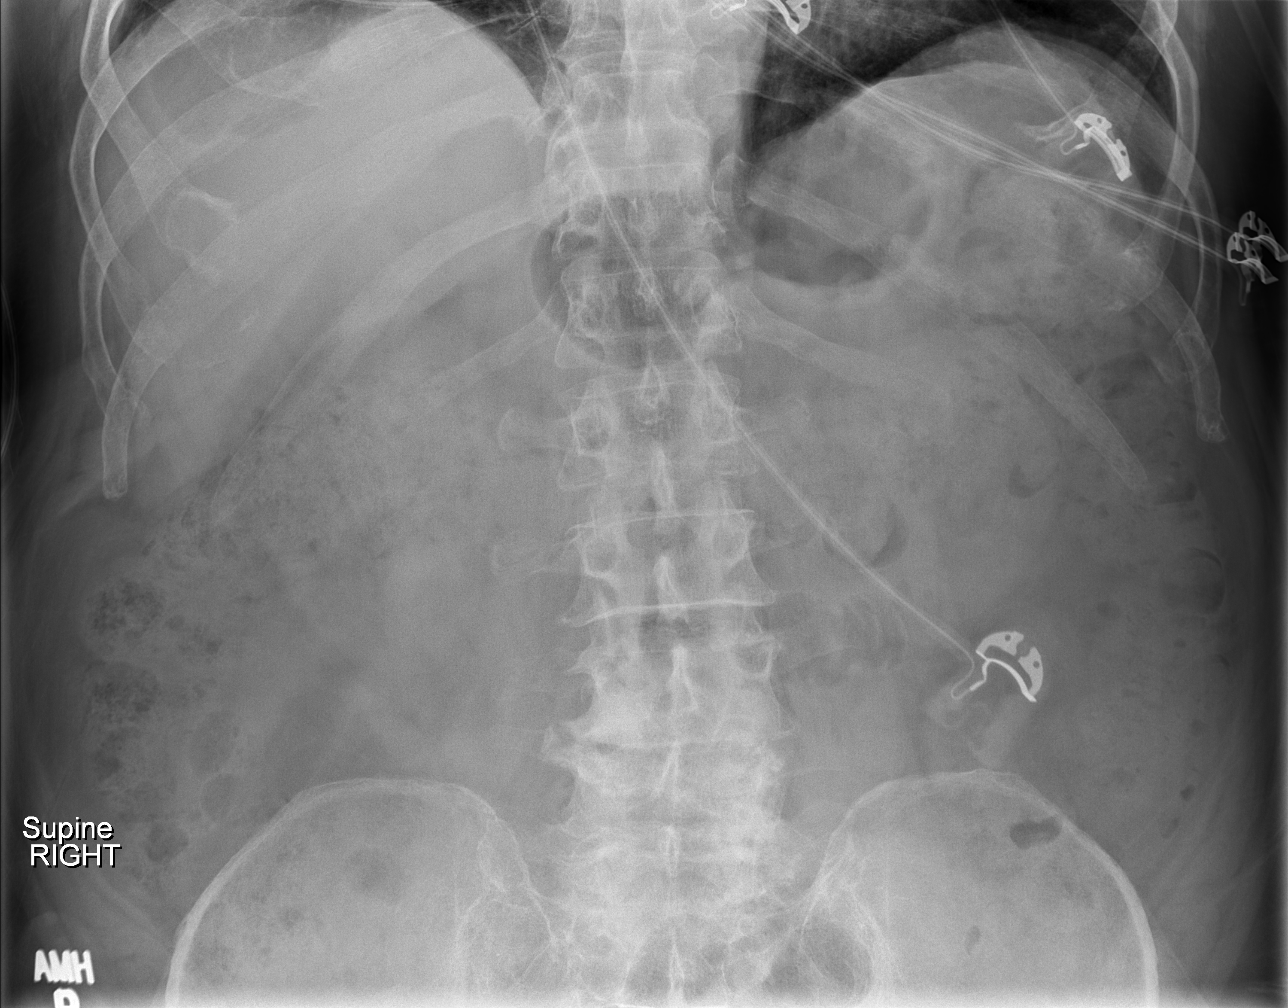

[4 of 4 positions shown; findings below may reference images not displayed]

FINDINGS: The cardiac shadow is within normal limits.  The lungs
are clear bilaterally.

Scattered large and small bowel gas is seen.  Fecal material is
noted throughout the colon.  No free air is noted.  No abnormal
mass or abnormal calcifications are seen.
IMPRESSION: No acute abnormality noted.

## 2015-01-30 ENCOUNTER — Encounter: Payer: Self-pay | Admitting: Oncology

## 2015-01-30 NOTE — Progress Notes (Signed)
Pt is approved through [employer] for Cytoxan, Velcade and Pomolyst from 02/03/15 to 02/02/16 or when the benefit cap has been met..  The amount of grant is $12,500.

## 2015-01-31 LAB — CHROMOSOME ANALYSIS, BONE MARROW

## 2015-02-03 ENCOUNTER — Ambulatory Visit (HOSPITAL_BASED_OUTPATIENT_CLINIC_OR_DEPARTMENT_OTHER): Payer: Medicare HMO | Admitting: Nurse Practitioner

## 2015-02-03 ENCOUNTER — Other Ambulatory Visit: Payer: Self-pay | Admitting: Family

## 2015-02-03 ENCOUNTER — Telehealth: Payer: Self-pay | Admitting: Oncology

## 2015-02-03 ENCOUNTER — Ambulatory Visit (HOSPITAL_BASED_OUTPATIENT_CLINIC_OR_DEPARTMENT_OTHER): Payer: Medicare HMO

## 2015-02-03 ENCOUNTER — Telehealth: Payer: Self-pay

## 2015-02-03 VITALS — BP 132/69 | HR 95 | Temp 98.4°F | Resp 18 | Ht 72.0 in | Wt 231.8 lb

## 2015-02-03 DIAGNOSIS — C9000 Multiple myeloma not having achieved remission: Secondary | ICD-10-CM

## 2015-02-03 DIAGNOSIS — D709 Neutropenia, unspecified: Secondary | ICD-10-CM

## 2015-02-03 DIAGNOSIS — D469 Myelodysplastic syndrome, unspecified: Secondary | ICD-10-CM | POA: Diagnosis not present

## 2015-02-03 DIAGNOSIS — R42 Dizziness and giddiness: Secondary | ICD-10-CM

## 2015-02-03 DIAGNOSIS — E119 Type 2 diabetes mellitus without complications: Secondary | ICD-10-CM

## 2015-02-03 DIAGNOSIS — D6481 Anemia due to antineoplastic chemotherapy: Secondary | ICD-10-CM

## 2015-02-03 DIAGNOSIS — D696 Thrombocytopenia, unspecified: Secondary | ICD-10-CM | POA: Diagnosis not present

## 2015-02-03 DIAGNOSIS — D630 Anemia in neoplastic disease: Secondary | ICD-10-CM

## 2015-02-03 DIAGNOSIS — Z86718 Personal history of other venous thrombosis and embolism: Secondary | ICD-10-CM

## 2015-02-03 LAB — COMPREHENSIVE METABOLIC PANEL (CC13)
ALT: 9 U/L (ref 0–55)
AST: 17 U/L (ref 5–34)
Albumin: 3 g/dL — ABNORMAL LOW (ref 3.5–5.0)
Alkaline Phosphatase: 46 U/L (ref 40–150)
Anion Gap: 6 mEq/L (ref 3–11)
BILIRUBIN TOTAL: 0.47 mg/dL (ref 0.20–1.20)
BUN: 17.6 mg/dL (ref 7.0–26.0)
CO2: 23 meq/L (ref 22–29)
CREATININE: 1.3 mg/dL (ref 0.7–1.3)
Calcium: 8.9 mg/dL (ref 8.4–10.4)
Chloride: 108 mEq/L (ref 98–109)
EGFR: 63 mL/min/{1.73_m2} — AB (ref >90)
Glucose: 99 mg/dl (ref 70–140)
POTASSIUM: 4.1 meq/L (ref 3.5–5.1)
SODIUM: 137 meq/L (ref 136–145)
TOTAL PROTEIN: 9.2 g/dL — AB (ref 6.4–8.3)

## 2015-02-03 LAB — CBC WITH DIFFERENTIAL/PLATELET
BASO%: 0.5 % (ref 0.0–2.0)
Basophils Absolute: 0 10*3/uL (ref 0.0–0.1)
EOS ABS: 0.1 10*3/uL (ref 0.0–0.5)
EOS%: 3.5 % (ref 0.0–7.0)
HCT: 30.1 % — ABNORMAL LOW (ref 38.4–49.9)
HGB: 10.3 g/dL — ABNORMAL LOW (ref 13.0–17.1)
LYMPH%: 83 % — AB (ref 14.0–49.0)
MCH: 32.2 pg (ref 27.2–33.4)
MCHC: 34.2 g/dL (ref 32.0–36.0)
MCV: 94 fL (ref 79.3–98.0)
MONO#: 0 10*3/uL — ABNORMAL LOW (ref 0.1–0.9)
MONO%: 2 % (ref 0.0–14.0)
NEUT%: 11 % — AB (ref 39.0–75.0)
NEUTROS ABS: 0.3 10*3/uL — AB (ref 1.5–6.5)
PLATELETS: 32 10*3/uL — AB (ref 140–400)
RBC: 3.21 10*6/uL — ABNORMAL LOW (ref 4.20–5.82)
RDW: 25.2 % — ABNORMAL HIGH (ref 11.0–14.6)
WBC: 2.3 10*3/uL — ABNORMAL LOW (ref 4.0–10.3)
lymph#: 1.9 10*3/uL (ref 0.9–3.3)

## 2015-02-03 LAB — HOLD TUBE, BLOOD BANK

## 2015-02-03 NOTE — Telephone Encounter (Signed)
Gave adn printed appt sched and avs for pt for Aug °

## 2015-02-03 NOTE — Patient Instructions (Signed)
Carfilzomib injection What is this medicine? CARFILZOMIB (kar FILZ oh mib) is a chemotherapy drug that works by slowing or stopping cancer cell growth. This medicine is used to treat multiple myeloma. This medicine may be used for other purposes; ask your health care provider or pharmacist if you have questions. COMMON BRAND NAME(S): KYPROLIS What should I tell my health care provider before I take this medicine? They need to know if you have any of these conditions: -heart disease -irregular heartbeat -liver disease -lung or breathing disease -an unusual or allergic reaction to carfilzomib, or other medicines, foods, dyes, or preservatives -pregnant or trying to get pregnant -breast-feeding How should I use this medicine? This medicine is for injection or infusion into a vein. It is given by a health care professional in a hospital or clinic setting. Talk to your pediatrician regarding the use of this medicine in children. Special care may be needed. Overdosage: If you think you've taken too much of this medicine contact a poison control center or emergency room at once. Overdosage: If you think you have taken too much of this medicine contact a poison control center or emergency room at once. NOTE: This medicine is only for you. Do not share this medicine with others. What if I miss a dose? It is important not to miss your dose. Call your doctor or health care professional if you are unable to keep an appointment. What may interact with this medicine? Interactions are not expected. Give your health care provider a list of all the medicines, herbs, non-prescription drugs, or dietary supplements you use. Also tell them if you smoke, drink alcohol, or use illegal drugs. Some items may interact with your medicine. This list may not describe all possible interactions. Give your health care provider a list of all the medicines, herbs, non-prescription drugs, or dietary supplements you use. Also  tell them if you smoke, drink alcohol, or use illegal drugs. Some items may interact with your medicine. What should I watch for while using this medicine? Your condition will be monitored carefully while you are receiving this medicine. Report any side effects. Continue your course of treatment even though you feel ill unless your doctor tells you to stop. Call your doctor or health care professional for advice if you get a fever, chills or sore throat, or other symptoms of a cold or flu. Do not treat yourself. Try to avoid being around people who are sick. Do not become pregnant while taking this medicine. Women should inform their doctor if they wish to become pregnant or think they might be pregnant. There is a potential for serious side effects to an unborn child. Talk to your health care professional or pharmacist for more information. Do not breast-feed an infant while taking this medicine. Check with your doctor or health care professional if you get an attack of severe diarrhea, nausea and vomiting, or if you sweat a lot. The loss of too much body fluid can make it dangerous for you to take this medicine. You may get dizzy. Do not drive, use machinery, or do anything that needs mental alertness until you know how this medicine affects you. Do not stand or sit up quickly, especially if you are an older patient. This reduces the risk of dizzy or fainting spells. What side effects may I notice from receiving this medicine? Side effects that you should report to your doctor or health care professional as soon as possible: -allergic reactions like skin rash, itching or hives,  swelling of the face, lips, or tongue -breathing problems -chest pain or palpitationschest tightness -cough -dark urine -dizziness -feeling faint or lightheaded -fever or chills -general ill feeling or flu-like symptoms -light-colored stools -palpitations -right upper belly pain -swelling of the legs or ankles -unusual  bleeding or bruising -unusually weak or tired -yellowing of the eyes or skin Side effects that usually do not require medical attention (Report these to your doctor or health care professional if they continue or are bothersome.): -diarrhea -headache -nausea, vomiting -tiredness This list may not describe all possible side effects. Call your doctor for medical advice about side effects. You may report side effects to FDA at 1-800-FDA-1088. Where should I keep my medicine? This drug is given in a hospital or clinic and will not be stored at home. NOTE: This sheet is a summary. It may not cover all possible information. If you have questions about this medicine, talk to your doctor, pharmacist, or health care provider.  2015, [employer]. (2011-12-17 17:02:29)

## 2015-02-03 NOTE — Telephone Encounter (Signed)
Called to inform pt of lab results. No answer/ Voicemail not set up. Will call back.

## 2015-02-03 NOTE — Progress Notes (Addendum)
[place] OFFICE PROGRESS NOTE   Diagnosis:  Myelodysplasia, multiple myeloma  INTERVAL HISTORY:   [name] returns as scheduled. 2-3 weeks ago he noted a small amount of blood with nose blowing. No other bleeding. No fever. No shaking chills. He denies nausea/vomiting. He continues to have intermittent dizziness.  Objective:  Vital signs in last 24 hours:  Blood pressure 132/69, pulse 95, temperature 98.4 F (36.9 C), temperature source Oral, resp. rate 18, height 6' (1.829 m), weight 231 lb 12.8 oz (105.144 kg), SpO2 99 %.    HEENT: No thrush or ulcers. Pupils equal round and reactive to light. Extraocular movements intact. Resp: Lungs clear bilaterally. Cardio: Regular rate and rhythm. GI: Abdomen soft and nontender. No organomegaly. Vascular: No leg edema.   Lab Results:  Lab Results  Component Value Date   WBC 2.6* 01/27/2015   HGB 10.7* 01/27/2015   HCT 29.2* 01/27/2015   MCV 87.2 01/27/2015   PLT 44* 01/27/2015   NEUTROABS 0.3* 01/27/2015    Imaging:  No results found.  Medications: I have reviewed the patient's current medications.  Assessment/Plan: 1. Multiple myeloma, status post treatment with melphalan/prednisone/thalidomide beginning in November 2009. The thalidomide was increased to 200 mg daily beginning 09/11/2008. The serum M spike was slightly improved on 03/18/2009 and slightly increased on 05/09/2009. He has been maintained off of specific therapy for multiple myeloma since September 2010. The serum M spike was increased on 10/20/2011 at 4.6. He began Revlimid on 11/11/2011 with weekly dexamethasone. He began cycle 2 of Revlimid on 12/09/2011. The serum M spike was improved on 12/31/2011. The serum M spike was slightly higher on 01/28/2012. He began cycle 4 on 02/03/2012. He developed recurrent hypercalcemia. Treatment was changed to Cytoxan/Velcade/Decadron beginning 03/02/2012. Cytoxan discontinued in January 2015  Treatment  continued with Velcade/Decadron.   Stable serum M spike and IgG 10/04/2013.   Stable serum M spike 12/06/2013   Treatment placed on hold 01/10/2014 secondary to thrombocytopenia and a slightly higher M spike   Serum M spike and IgG increased 03/07/2014.  Cycle 1 Pomalidomide/dexamethasone 04/01/2014.  Pomalidomide subsequently placed on hold due to cytopenias.  Pomalidomide resumed at a reduced dose 05/23/2014.  Pomalidomide placed on hold 06/04/2014 due to progressive pancytopenia.  Bone marrow biopsy 01/23/2015 showed 60% atypical plasma cells. Myeloid cells in the background with dyspoietic changes associated with increased myeloblastic cells representing up to 11% of all cells. 2. Chronic "dizziness."  3. Chest x-ray 11/01/2008 with a patchy opacity at the right midlung suspicious for pneumonia. He was treated with Avelox. 4. Low back pain with a radicular component. An MRI on 10/07/2008 showed no involvement of the lumbosacral spine with myeloma. 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 5. Bilateral neck pain, status post a CT 05/17/2008 with findings of spinal stenosis and osteoarthritis. 6. 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 Red cell microcytosis, likely related to hemoglobin C trait. A ferritin level returned elevated and a stool Hemoccult was negative 01/23/2010. He underwent a colonoscopy in 2009. 8. Elevated beta-2 microglobulin level. 9. History of hypertension. Blood pressure medication placed on hold December 2015 due to hypotension. 10. History of a herniated disk at L4-L5 on MRI an 01/11/2002. 11. Hyperlipidemia. 12. Gastroesophageal reflux disease. 13. History of atypical angina. 14. History of rectal bleeding-large external hemorrhoids noted 02/25/2012. The stool was Hemoccult positive. He has a  history of  colon polyps. He is followed by [facility]. He was diagnosed with hemorrhoids in September of 2013 while hospitalized. He underwent a band procedure 03/09/2013. He has had no further rectal bleeding. 15. Superficial venous thrombosis of the greater saphenous vein on a Doppler 09/27/2008. Negative for deep vein thrombosis. Symptoms improved with aspirin. 16. Neck pain with numbness/tingling in the arms, hands, and low back with proximal right leg weakness. An MRI of the cervical spine 05/30/2009 showed multilevel spondylosis with mild cord edema at C4-C5 and C5-C6. He was noted to have an enhancing lesion of the cord at T3-T4 of unclear etiology. In the lumbar spine there was no change in the spondylosis at L3-L4 and L4-L5. There was no involvement of the cervical or lumbar spine with myeloma. He is status post C4-C5, C5-C6, and C6-C7 anterior cervical diskectomy with fusion by Dr. Annette Stable 08/01/2009. 17. Thrombocytopenia-progressive, secondary to myelodysplasia and 5 azacytidine. 18. Indeterminate-age deep vein thrombosis of the left lower extremity on a venous Doppler 08/15/2009. There were also findings consistent with superficial thrombosis involving the right lower extremity 08/15/2009. Coumadin was discontinued in October 2011. 19. Type 2 diabetes. Now on an oral hypoglycemic 20. History of hematuria, followed at [facility]. Urinalysis was negative for blood on 07/23/2011. 21. Radicular back pain with MRI of the lumbar spine on 10/19/2011 showing nerve root impingement at L2-L3, L3-L4 and L4-L5 with the most severe at the right L3 nerve roots due to a large disc fragment. Associated right leg weakness. He underwent right L2-3 decompressive laminotomy with right L2 and L3 decompressive foraminotomy; right L2-3 microdiscectomy on 11/01/2011. 22. Constipation. He continues a laxative regimen. 23. Hospitalization 11/08/2011 through 11/10/2011 with orthostatic hypotension/dizziness. He  improved with intravenous hydration. He had mild hypotension when here on 11/18/2011. We recommended discontinuing Norvasc and Proscar. 24. Hypercalcemia status post pamidronate 11/03/2011. Recurrent hypercalcemia 02/16/2012 status post Zometa. The calcium has remained in normal range. 25. Fractured tooth with associated pain. Status post a tooth extraction by Dr. Enrique Sack, Zometa was placed on hold. 26. Exertional dyspnea. Stable. He was referred to cardiology, pulmonary function studies 06/15/2013 showed a moderately severe diffusion defect. He utilizes supplemental oxygen as needed. He is followed by pulmonary. 27. 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 28. 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   Status post cycle 1 5-azacytidine beginning 07/08/2014 4 days (day 5 held).  Cycle 2 5-azacytidine beginning 08/05/2014 5 days with Neulasta support.  Cycle 3 5-azacytidine beginning 09/02/2014 with Neulasta support.  Cycle 4 5 azacytidine beginning 09/30/2014 with Neulasta support  Cycle 5 5-azacytidine beginning 10/28/2014 with Neulasta support  Cycle 6 5-azacytidine beginning 11/25/2014 with Neulasta support  Cycle 7 5-azacytidine beginning 12/23/2014 with Neulasta support  Treatment placed on hold 01/20/2015 due to progressive pancytopenia. Referred for a bone marrow.  Bone marrow biopsy 01/23/2015 showed 60% atypical plasma cells. Myeloid cells in the background with dyspoietic changes associated with increased myeloblastic cells representing up to 11% of all cells. 29. History of hypotension. Blood pressure medication placed on hold December 2015.   Disposition: Mr. Tungate has progressive pancytopenia in the setting  of MDS and multiple myeloma. The bone marrow biopsy on 01/23/2015 shows progression of the myeloma, similar percentage blast cells. Dr. Benay Spice recommends placing the 5-azacytidine on hold and beginning treatment with Carfilzomib.  We reviewed the potential for hematologic toxicity, hepatotoxicity, renal toxicity, pulmonary toxicity, peripheral neuropathy, nausea/vomiting, and diarrhea or constipation. He is agreeable to proceed. He would like to avoid a Port-A-Cath if possible. We are scheduling him to begin cycle 1 on 02/10/2015. We will see him in follow-up on 02/24/2015. He will contact the office in the interim with any problems. We specifically discussed fever, chills, other signs of infection, bleeding.  Patient seen with Dr. Benay Spice. 25 minutes were spent face-to-face at today's visit with the majority of that time involved in counseling/coordination of care.  Ned Card ANP/GNP-BC   02/03/2015  2:43 PM  This was a shared visit with Ned Card. Mr. Shelnutt has progressive multiple myeloma. This likely explains the neutropenia/thrombocytopenia. The plan is to begin systemic treatment for myeloma. I plan to discuss his case with my colleagues and the myeloma service at one of the nearby universities. We will decide between Carfilzomib and Daratumumab. We reviewed the potential toxicities associated with these agents and he agrees to proceed. Further treatment with 5 azacytidine will be placed on hold.  Julieanne Manson, M.D.

## 2015-02-04 NOTE — Telephone Encounter (Signed)
Attempted to call pt back regarding lab results. Still no answer. VM not set up.

## 2015-02-05 ENCOUNTER — Telehealth: Payer: Self-pay | Admitting: Family

## 2015-02-05 LAB — IGG: IgG (Immunoglobin G), Serum: 4900 mg/dL — ABNORMAL HIGH (ref 650–1600)

## 2015-02-05 MED ORDER — PANTOPRAZOLE SODIUM 40 MG PO TBEC: DELAYED_RELEASE_TABLET | ORAL | Status: DC

## 2015-02-05 NOTE — Telephone Encounter (Signed)
done

## 2015-02-07 ENCOUNTER — Other Ambulatory Visit: Payer: Self-pay | Admitting: *Deleted

## 2015-02-07 ENCOUNTER — Telehealth: Payer: Self-pay | Admitting: *Deleted

## 2015-02-07 ENCOUNTER — Telehealth: Payer: Self-pay | Admitting: Oncology

## 2015-02-07 ENCOUNTER — Encounter (HOSPITAL_COMMUNITY): Payer: Self-pay

## 2015-02-07 MED ORDER — DEXAMETHASONE 4 MG PO TABS: ORAL_TABLET | ORAL | Status: DC

## 2015-02-07 NOTE — Telephone Encounter (Signed)
Confirmed appointment for lab only on 08/01

## 2015-02-07 NOTE — Telephone Encounter (Signed)
Per Dr. Benay Spice; notified pt's wife that MD has moved treatment to Thursday August 4th; still needs to come in Monday Aug. 1 to check labs but will not be treated Monday.  Instructions given that pt needs to start Decadron 20 mg every week and he needs to start that today; also to monitor his blood sugar because steroids will make his sugar go up.  Pt's wife verbalized back all instructions correctly and acknowledged to call if any problems.

## 2015-02-09 ENCOUNTER — Other Ambulatory Visit: Payer: Self-pay | Admitting: Oncology

## 2015-02-10 ENCOUNTER — Ambulatory Visit: Payer: Self-pay

## 2015-02-10 ENCOUNTER — Other Ambulatory Visit: Payer: Self-pay

## 2015-02-10 ENCOUNTER — Other Ambulatory Visit (HOSPITAL_BASED_OUTPATIENT_CLINIC_OR_DEPARTMENT_OTHER): Payer: Medicare HMO

## 2015-02-10 ENCOUNTER — Telehealth: Payer: Self-pay | Admitting: *Deleted

## 2015-02-10 DIAGNOSIS — D469 Myelodysplastic syndrome, unspecified: Secondary | ICD-10-CM

## 2015-02-10 DIAGNOSIS — C9000 Multiple myeloma not having achieved remission: Secondary | ICD-10-CM

## 2015-02-10 LAB — CBC WITH DIFFERENTIAL/PLATELET
BASO%: 0.4 % (ref 0.0–2.0)
BASOS ABS: 0 10*3/uL (ref 0.0–0.1)
EOS ABS: 0 10*3/uL (ref 0.0–0.5)
EOS%: 1.1 % (ref 0.0–7.0)
HEMATOCRIT: 31.2 % — AB (ref 38.4–49.9)
HGB: 10.6 g/dL — ABNORMAL LOW (ref 13.0–17.1)
LYMPH#: 2.5 10*3/uL (ref 0.9–3.3)
LYMPH%: 88.3 % — AB (ref 14.0–49.0)
MCH: 32.8 pg (ref 27.2–33.4)
MCHC: 34.1 g/dL (ref 32.0–36.0)
MCV: 96.2 fL (ref 79.3–98.0)
MONO#: 0.1 10*3/uL (ref 0.1–0.9)
MONO%: 2.2 % (ref 0.0–14.0)
NEUT#: 0.2 10*3/uL — CL (ref 1.5–6.5)
NEUT%: 8 % — AB (ref 39.0–75.0)
PLATELETS: 21 10*3/uL — AB (ref 140–400)
RBC: 3.24 10*6/uL — ABNORMAL LOW (ref 4.20–5.82)
RDW: 25.1 % — ABNORMAL HIGH (ref 11.0–14.6)
WBC: 2.8 10*3/uL — ABNORMAL LOW (ref 4.0–10.3)

## 2015-02-10 NOTE — Telephone Encounter (Signed)
Per staff message and POF I have scheduled appts. Advised scheduler of appts. JMW  

## 2015-02-11 ENCOUNTER — Other Ambulatory Visit: Payer: Self-pay | Admitting: Nurse Practitioner

## 2015-02-11 ENCOUNTER — Ambulatory Visit: Payer: Self-pay

## 2015-02-11 ENCOUNTER — Telehealth: Payer: Self-pay | Admitting: Oncology

## 2015-02-11 ENCOUNTER — Telehealth: Payer: Self-pay | Admitting: *Deleted

## 2015-02-11 DIAGNOSIS — C9000 Multiple myeloma not having achieved remission: Secondary | ICD-10-CM

## 2015-02-11 DIAGNOSIS — D469 Myelodysplastic syndrome, unspecified: Secondary | ICD-10-CM

## 2015-02-11 NOTE — Telephone Encounter (Signed)
Pt confirmed labs/ov per 08/02 POF, gave pt avs and calendar.... KJ °

## 2015-02-11 NOTE — Telephone Encounter (Signed)
Dr. Benay Spice completed peer-to-peer with Dr. Truddie Coco of [facility]. Velcade and Daratumumab has been denied. Will need to re-submit as single agent Daratumumab.

## 2015-02-12 ENCOUNTER — Other Ambulatory Visit: Payer: Self-pay | Admitting: *Deleted

## 2015-02-13 ENCOUNTER — Ambulatory Visit (HOSPITAL_BASED_OUTPATIENT_CLINIC_OR_DEPARTMENT_OTHER): Payer: Medicare HMO | Admitting: Oncology

## 2015-02-13 ENCOUNTER — Other Ambulatory Visit: Payer: Self-pay | Admitting: *Deleted

## 2015-02-13 ENCOUNTER — Ambulatory Visit (HOSPITAL_BASED_OUTPATIENT_CLINIC_OR_DEPARTMENT_OTHER): Payer: Medicare HMO

## 2015-02-13 ENCOUNTER — Other Ambulatory Visit (HOSPITAL_BASED_OUTPATIENT_CLINIC_OR_DEPARTMENT_OTHER): Payer: Medicare HMO

## 2015-02-13 ENCOUNTER — Other Ambulatory Visit: Payer: Self-pay | Admitting: Oncology

## 2015-02-13 VITALS — BP 124/74 | HR 70 | Temp 98.0°F | Resp 20

## 2015-02-13 DIAGNOSIS — I10 Essential (primary) hypertension: Secondary | ICD-10-CM

## 2015-02-13 DIAGNOSIS — E119 Type 2 diabetes mellitus without complications: Secondary | ICD-10-CM

## 2015-02-13 DIAGNOSIS — C9000 Multiple myeloma not having achieved remission: Secondary | ICD-10-CM

## 2015-02-13 DIAGNOSIS — D709 Neutropenia, unspecified: Secondary | ICD-10-CM

## 2015-02-13 DIAGNOSIS — D696 Thrombocytopenia, unspecified: Secondary | ICD-10-CM | POA: Diagnosis not present

## 2015-02-13 DIAGNOSIS — D469 Myelodysplastic syndrome, unspecified: Secondary | ICD-10-CM

## 2015-02-13 DIAGNOSIS — Z5112 Encounter for antineoplastic immunotherapy: Secondary | ICD-10-CM

## 2015-02-13 DIAGNOSIS — D630 Anemia in neoplastic disease: Secondary | ICD-10-CM

## 2015-02-13 DIAGNOSIS — Z86718 Personal history of other venous thrombosis and embolism: Secondary | ICD-10-CM

## 2015-02-13 DIAGNOSIS — D6481 Anemia due to antineoplastic chemotherapy: Secondary | ICD-10-CM

## 2015-02-13 LAB — CBC WITH DIFFERENTIAL/PLATELET
BASO%: 0.3 % (ref 0.0–2.0)
Basophils Absolute: 0 10*3/uL (ref 0.0–0.1)
EOS%: 1.9 % (ref 0.0–7.0)
Eosinophils Absolute: 0 10*3/uL (ref 0.0–0.5)
HCT: 29.9 % — ABNORMAL LOW (ref 38.4–49.9)
HGB: 10.3 g/dL — ABNORMAL LOW (ref 13.0–17.1)
LYMPH#: 1.6 10*3/uL (ref 0.9–3.3)
LYMPH%: 78.6 % — ABNORMAL HIGH (ref 14.0–49.0)
MCH: 33.1 pg (ref 27.2–33.4)
MCHC: 34.5 g/dL (ref 32.0–36.0)
MCV: 96.1 fL (ref 79.3–98.0)
MONO#: 0.1 10*3/uL (ref 0.1–0.9)
MONO%: 3 % (ref 0.0–14.0)
NEUT#: 0.3 10*3/uL — CL (ref 1.5–6.5)
NEUT%: 16.2 % — ABNORMAL LOW (ref 39.0–75.0)
Platelets: 16 10*3/uL — ABNORMAL LOW (ref 140–400)
RBC: 3.11 10*6/uL — ABNORMAL LOW (ref 4.20–5.82)
RDW: 25.2 % — ABNORMAL HIGH (ref 11.0–14.6)
WBC: 2 10*3/uL — ABNORMAL LOW (ref 4.0–10.3)

## 2015-02-13 LAB — COMPREHENSIVE METABOLIC PANEL (CC13)
ALBUMIN: 3 g/dL — AB (ref 3.5–5.0)
ALT: 10 U/L (ref 0–55)
AST: 15 U/L (ref 5–34)
Alkaline Phosphatase: 48 U/L (ref 40–150)
Anion Gap: 4 mEq/L (ref 3–11)
BUN: 14.5 mg/dL (ref 7.0–26.0)
CO2: 25 mEq/L (ref 22–29)
CREATININE: 1 mg/dL (ref 0.7–1.3)
Calcium: 8.7 mg/dL (ref 8.4–10.4)
Chloride: 107 mEq/L (ref 98–109)
EGFR: 83 mL/min/{1.73_m2} — ABNORMAL LOW (ref >90)
Glucose: 92 mg/dl (ref 70–140)
POTASSIUM: 3.6 meq/L (ref 3.5–5.1)
Sodium: 137 mEq/L (ref 136–145)
Total Bilirubin: 0.35 mg/dL (ref 0.20–1.20)
Total Protein: 8.9 g/dL — ABNORMAL HIGH (ref 6.4–8.3)

## 2015-02-13 MED ORDER — ACETAMINOPHEN 325 MG PO TABS
650.0000 mg | ORAL_TABLET | Freq: Once | ORAL | Status: AC
  Administered 2015-02-13: 650 mg via ORAL

## 2015-02-13 MED ORDER — METHYLPREDNISOLONE SODIUM SUCC 125 MG IJ SOLR
125.0000 mg | Freq: Once | INTRAMUSCULAR | Status: AC
Start: 2015-02-13 — End: 2015-02-13
  Administered 2015-02-13: 125 mg via INTRAVENOUS

## 2015-02-13 MED ORDER — DEXAMETHASONE 4 MG PO TABS: 4.0000 mg | ORAL_TABLET | Freq: Two times a day (BID) | ORAL | Status: AC

## 2015-02-13 MED ORDER — SODIUM CHLORIDE 0.9 % IV SOLN
Freq: Once | INTRAVENOUS | Status: AC
  Administered 2015-02-13: 10:00:00 via INTRAVENOUS

## 2015-02-13 MED ORDER — MONTELUKAST SODIUM 10 MG PO TABS: 10.0000 mg | ORAL_TABLET | Freq: Every day | ORAL | Status: DC

## 2015-02-13 MED ORDER — SODIUM CHLORIDE 0.9 % IV SOLN: 16.0000 mg/kg | Freq: Once | INTRAVENOUS | Status: DC

## 2015-02-13 MED ORDER — SODIUM CHLORIDE 0.9 % IV SOLN
15.2000 mg/kg | Freq: Once | INTRAVENOUS | Status: AC
  Administered 2015-02-13: 1600 mg via INTRAVENOUS
  Filled 2015-02-13: qty 80

## 2015-02-13 MED ORDER — ACETAMINOPHEN 325 MG PO TABS
ORAL_TABLET | ORAL | Status: AC
  Filled 2015-02-13: qty 2

## 2015-02-13 MED ORDER — METHYLPREDNISOLONE SODIUM SUCC 125 MG IJ SOLR
INTRAMUSCULAR | Status: AC
  Filled 2015-02-13: qty 2

## 2015-02-13 MED ORDER — DIPHENHYDRAMINE HCL 25 MG PO CAPS
50.0000 mg | ORAL_CAPSULE | Freq: Once | ORAL | Status: AC
  Administered 2015-02-13: 50 mg via ORAL

## 2015-02-13 MED ORDER — SODIUM CHLORIDE 0.9 % IV SOLN
Freq: Once | INTRAVENOUS | Status: AC
  Administered 2015-02-13: 10:00:00 via INTRAVENOUS
  Filled 2015-02-13: qty 4

## 2015-02-13 MED ORDER — DIPHENHYDRAMINE HCL 25 MG PO CAPS
ORAL_CAPSULE | ORAL | Status: AC
  Filled 2015-02-13: qty 2

## 2015-02-13 MED ORDER — MONTELUKAST SODIUM 10 MG PO TABS
10.0000 mg | ORAL_TABLET | Freq: Once | ORAL | Status: AC
  Administered 2015-02-13: 10 mg via ORAL
  Filled 2015-02-13: qty 1

## 2015-02-13 MED ORDER — ALBUTEROL SULFATE HFA 108 (90 BASE) MCG/ACT IN AERS: 2.0000 | INHALATION_SPRAY | Freq: Four times a day (QID) | RESPIRATORY_TRACT | Status: AC | PRN

## 2015-02-13 NOTE — Progress Notes (Signed)
Dr. Benay Spice saw pt today in infusion room.   MD had reviewed all lab results today.  Per md,  Proceed with Daratumumab as ordered.  Pt to have lab rechecked on Fri 02/14/15 and possible platelet transfusion if needed.  Lavella Lemons, RN desk nurse will work on all issues as ordered by md.  Both pt and wife voiced understanding.

## 2015-02-13 NOTE — Patient Instructions (Addendum)
[facility] Discharge Instructions for Patients Receiving Chemotherapy  Today you received the following chemotherapy agents :  Daratumumab.  To help prevent nausea and vomiting after your treatment, we encourage you to take your nausea medication .  Take  Dexamethasone  4 mg  -  5 tablets ( total of 20 mg ) on Friday 8/5 only.   Then  On  8/6 and 8/7 ,  Take  Dexamethasone 4 mg Twice daily only.   Take steroids with food.   Take  Singulair  10 mg on 02/14/15.   For subsequent treatments,  Take  Singulair  10mg   Day  Before  And  Day  After  Chemo.   Can also  Take   Albuterol   2  Puffs  Inhalation every 6 hours  As needed for  Wheezing  And/Or  Shortness  Of   Breath.  Go to  ER immediately if  You develop  Extreme  Wheezing  And/Or  Shortness of  Breath  Not  Relieved by  Albuterol  Inhaler.  If you develop nausea and vomiting that is not controlled by your nausea medication, call the clinic.   BELOW ARE SYMPTOMS THAT SHOULD BE REPORTED IMMEDIATELY:  *FEVER GREATER THAN 100.5 F  *CHILLS WITH OR WITHOUT FEVER  NAUSEA AND VOMITING THAT IS NOT CONTROLLED WITH YOUR NAUSEA MEDICATION  *UNUSUAL SHORTNESS OF BREATH  *UNUSUAL BRUISING OR BLEEDING  TENDERNESS IN MOUTH AND THROAT WITH OR WITHOUT PRESENCE OF ULCERS  *URINARY PROBLEMS  *BOWEL PROBLEMS  UNUSUAL RASH Items with * indicate a potential emergency and should be followed up as soon as possible.  Feel free to call the clinic you have any questions or concerns. The clinic phone number is [phone].  Please show the [place] at check-in to the Emergency Department and triage nurse.

## 2015-02-13 NOTE — Progress Notes (Signed)
[name] OFFICE PROGRESS NOTE   Diagnosis: Multiple myeloma, myelodysplasia   INTERVAL HISTORY:   [name] returns as scheduled. He continues to feel "weak ". He reports burning discomfort in the left leg. No bleeding.  Objective:  Vital signs in last 24 hours:  There were no vitals taken for this visit.    HEENT: No thrush or bleeding Resp: Decreased breath sounds at the lower posterior chest bilaterally, no respiratory distress Cardio: Regular rate and rhythm GI: No hepatosplenomegaly Vascular: No leg edema or erythema   Lab Results:  Lab Results  Component Value Date   WBC 2.0* 02/13/2015   HGB 10.3* 02/13/2015   HCT 29.9* 02/13/2015   MCV 96.1 02/13/2015   PLT 16 Platelet count confirmed by slide estimate* 02/13/2015   NEUTROABS 0.3* 02/13/2015     Medications: I have reviewed the patient's current medications.  Assessment/Plan: 1. Multiple myeloma, status post treatment with melphalan/prednisone/thalidomide beginning in November 2009. The thalidomide was increased to 200 mg daily beginning 09/11/2008. The serum M spike was slightly improved on 03/18/2009 and slightly increased on 05/09/2009. He has been maintained off of specific therapy for multiple myeloma since September 2010. The serum M spike was increased on 10/20/2011 at 4.6. He began Revlimid on 11/11/2011 with weekly dexamethasone. He began cycle 2 of Revlimid on 12/09/2011. The serum M spike was improved on 12/31/2011. The serum M spike was slightly higher on 01/28/2012. He began cycle 4 on 02/03/2012. He developed recurrent hypercalcemia. Treatment was changed to Cytoxan/Velcade/Decadron beginning 03/02/2012. Cytoxan discontinued in January 2015  Treatment continued with Velcade/Decadron.   Stable serum M spike and IgG 10/04/2013.   Stable serum M spike 12/06/2013   Treatment placed on hold 01/10/2014 secondary to thrombocytopenia and a slightly higher M spike   Serum M spike  and IgG increased 03/07/2014.  Cycle 1 Pomalidomide/dexamethasone 04/01/2014.  Pomalidomide subsequently placed on hold due to cytopenias.  Pomalidomide resumed at a reduced dose 05/23/2014.  Pomalidomide placed on hold 06/04/2014 due to progressive pancytopenia.  Bone marrow biopsy 01/23/2015 showed 60% atypical plasma cells. Myeloid cells in the background with dyspoietic changes associated with increased myeloblastic cells representing up to 11% of all cells.  Daratumumab started 02/13/2015 2. Chronic "dizziness."  3. Chest x-ray 11/01/2008 with a patchy opacity at the right midlung suspicious for pneumonia. He was treated with Avelox. 4. Low back pain with a radicular component. An MRI on 10/07/2008 showed no involvement of the lumbosacral spine with myeloma. 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 5. Bilateral neck pain, status post a CT 05/17/2008 with findings of spinal stenosis and osteoarthritis. 6. 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 Red cell microcytosis, likely related to hemoglobin C trait. A ferritin level returned elevated and a stool Hemoccult was negative 01/23/2010. He underwent a colonoscopy in 2009. 8. Elevated beta-2 microglobulin level. 9. History of hypertension. Blood pressure medication placed on hold December 2015 due to hypotension. 10. History of a herniated disk at L4-L5 on MRI an 01/11/2002. 11. Hyperlipidemia. 12. Gastroesophageal reflux disease. 13. History of atypical angina. 14. History of rectal bleeding-large external hemorrhoids noted 02/25/2012. The stool was Hemoccult positive. He has a history of colon polyps. He is followed by [facility]. He was diagnosed with hemorrhoids in September of 2013 while hospitalized. He underwent a band procedure 03/09/2013. He has had no further rectal  bleeding. 15. Superficial  venous thrombosis of the greater saphenous vein on a Doppler 09/27/2008. Negative for deep vein thrombosis. Symptoms improved with aspirin. 16. Neck pain with numbness/tingling in the arms, hands, and low back with proximal right leg weakness. An MRI of the cervical spine 05/30/2009 showed multilevel spondylosis with mild cord edema at C4-C5 and C5-C6. He was noted to have an enhancing lesion of the cord at T3-T4 of unclear etiology. In the lumbar spine there was no change in the spondylosis at L3-L4 and L4-L5. There was no involvement of the cervical or lumbar spine with myeloma. He is status post C4-C5, C5-C6, and C6-C7 anterior cervical diskectomy with fusion by Dr. Annette Stable 08/01/2009. 17. Thrombocytopenia-progressive, secondary to myelodysplasia and 5 azacytidine. 18. Indeterminate-age deep vein thrombosis of the left lower extremity on a venous Doppler 08/15/2009. There were also findings consistent with superficial thrombosis involving the right lower extremity 08/15/2009. Coumadin was discontinued in October 2011. 19. Type 2 diabetes. Now on an oral hypoglycemic 20. History of hematuria, followed at [facility]. Urinalysis was negative for blood on 07/23/2011. 21. Radicular back pain with MRI of the lumbar spine on 10/19/2011 showing nerve root impingement at L2-L3, L3-L4 and L4-L5 with the most severe at the right L3 nerve roots due to a large disc fragment. Associated right leg weakness. He underwent right L2-3 decompressive laminotomy with right L2 and L3 decompressive foraminotomy; right L2-3 microdiscectomy on 11/01/2011. 22. Constipation. He continues a laxative regimen. 23. Hospitalization 11/08/2011 through 11/10/2011 with orthostatic hypotension/dizziness. He improved with intravenous hydration. He had mild hypotension when here on 11/18/2011. We recommended discontinuing Norvasc and Proscar. 24. Hypercalcemia status post pamidronate 11/03/2011. Recurrent  hypercalcemia 02/16/2012 status post Zometa. The calcium has remained in normal range. 25. Fractured tooth with associated pain. Status post a tooth extraction by Dr. Enrique Sack, Zometa was placed on hold. 26. Exertional dyspnea. Stable. He was referred to cardiology, pulmonary function studies 06/15/2013 showed a moderately severe diffusion defect. He utilizes supplemental oxygen as needed. He is followed by pulmonary. 27. 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 28. 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   Status post cycle 1 5-azacytidine beginning 07/08/2014 4 days (day 5 held).  Cycle 2 5-azacytidine beginning 08/05/2014 5 days with Neulasta support.  Cycle 3 5-azacytidine beginning 09/02/2014 with Neulasta support.  Cycle 4 5 azacytidine beginning 09/30/2014 with Neulasta support  Cycle 5 5-azacytidine beginning 10/28/2014 with Neulasta support  Cycle 6 5-azacytidine beginning 11/25/2014 with Neulasta support  Cycle 7 5-azacytidine beginning 12/23/2014 with Neulasta support  Treatment placed on hold 01/20/2015 due to progressive pancytopenia. Referred for a bone marrow.  Bone marrow biopsy 01/23/2015 showed 60% atypical plasma cells. Myeloid cells in the background with dyspoietic changes associated with increased myeloblastic cells representing up to 11% of all cells. 29. History of hypotension. Blood pressure medication placed on hold December 2015. 30. Severe neutropenia/thrombocytopenia-most likely secondary to progression of multiple myeloma     Disposition:  [name] has progressive multiple myeloma. I recommend beginning treatment with Daratumumab. We would like to add Velcade, but his insurance will  not approve this despite recent data supporting this combination. We reviewed the potential toxicities associated with Daratumumab including the chance of worsened pancytopenia, and allergic reaction, and bronchitis symptoms. He agrees to proceed. He will receive sterile Bronchodilator prophylaxis.  [name] will return for a CBC and platelet transfusion as indicated on 02/14/2015. He will return for an office visit and Daratumumab next week.  Betsy Coder, MD  02/13/2015  6:22 PM

## 2015-02-14 ENCOUNTER — Other Ambulatory Visit (HOSPITAL_BASED_OUTPATIENT_CLINIC_OR_DEPARTMENT_OTHER): Payer: Medicare HMO

## 2015-02-14 ENCOUNTER — Other Ambulatory Visit: Payer: Self-pay | Admitting: *Deleted

## 2015-02-14 ENCOUNTER — Ambulatory Visit: Payer: Medicare HMO

## 2015-02-14 ENCOUNTER — Ambulatory Visit (HOSPITAL_COMMUNITY)
Admission: RE | Admit: 2015-02-14 | Discharge: 2015-02-14 | Disposition: A | Discharge status: Home / Self Care | Payer: Medicare HMO | Source: Ambulatory Visit | Attending: Oncology | Admitting: Oncology

## 2015-02-14 ENCOUNTER — Telehealth: Payer: Self-pay | Admitting: *Deleted

## 2015-02-14 ENCOUNTER — Telehealth: Payer: Self-pay | Admitting: Neurology

## 2015-02-14 VITALS — BP 130/68 | HR 68 | Temp 97.9°F | Resp 18

## 2015-02-14 DIAGNOSIS — C9000 Multiple myeloma not having achieved remission: Secondary | ICD-10-CM

## 2015-02-14 LAB — CBC WITH DIFFERENTIAL/PLATELET
BASO%: 0.2 % (ref 0.0–2.0)
Basophils Absolute: 0 10*3/uL (ref 0.0–0.1)
EOS ABS: 0 10*3/uL (ref 0.0–0.5)
EOS%: 0 % (ref 0.0–7.0)
HCT: 28.5 % — ABNORMAL LOW (ref 38.4–49.9)
HGB: 9.8 g/dL — ABNORMAL LOW (ref 13.0–17.1)
LYMPH%: 26.3 % (ref 14.0–49.0)
MCH: 32.8 pg (ref 27.2–33.4)
MCHC: 34.5 g/dL (ref 32.0–36.0)
MCV: 95.2 fL (ref 79.3–98.0)
MONO#: 0 10*3/uL — ABNORMAL LOW (ref 0.1–0.9)
MONO%: 1.4 % (ref 0.0–14.0)
NEUT#: 0.4 10*3/uL — CL (ref 1.5–6.5)
NEUT%: 72.1 % (ref 39.0–75.0)
NRBC: 0 % (ref 0–0)
PLATELETS: 14 10*3/uL — AB (ref 140–400)
RBC: 3 10*6/uL — AB (ref 4.20–5.82)
RDW: 24.5 % — AB (ref 11.0–14.6)
WBC: 0.6 10*3/uL — CL (ref 4.0–10.3)
lymph#: 0.1 10*3/uL — ABNORMAL LOW (ref 0.9–3.3)

## 2015-02-14 LAB — TECHNOLOGIST REVIEW

## 2015-02-14 MED ORDER — SODIUM CHLORIDE 0.9 % IV SOLN: 250.0000 mL | Freq: Once | INTRAVENOUS | Status: DC

## 2015-02-14 NOTE — Telephone Encounter (Signed)
Pt here for platelet transfusion. Spoke with pt about any side effects from his daratumumab treatment yesterday. Pt denied feeling any changes from yesterday; denied shortness of breath (pt stated he used his albuterol inhaler a few times, even though he didn't feel like he needed it), nausea/vomiting/diarrhea or constipation. Stated appetite good, drinking plenty of fluid.

## 2015-02-14 NOTE — Patient Instructions (Signed)

## 2015-02-14 NOTE — Telephone Encounter (Signed)
Spoke with pt's wife, they will return today at 2:45 for platelet transfusion.

## 2015-02-14 NOTE — Telephone Encounter (Signed)
Received CBC with stable, critical ANC. PLT 14k.  Went to speak to pt in lobby- he has left the building. No answer on mobile phone.

## 2015-02-16 ENCOUNTER — Other Ambulatory Visit: Payer: Self-pay | Admitting: Oncology

## 2015-02-16 LAB — PREPARE PLATELET PHERESIS: UNIT DIVISION: 0

## 2015-02-17 ENCOUNTER — Ambulatory Visit: Payer: Self-pay

## 2015-02-17 ENCOUNTER — Other Ambulatory Visit (HOSPITAL_BASED_OUTPATIENT_CLINIC_OR_DEPARTMENT_OTHER): Payer: Medicare HMO

## 2015-02-17 ENCOUNTER — Telehealth: Payer: Self-pay | Admitting: *Deleted

## 2015-02-17 DIAGNOSIS — C9000 Multiple myeloma not having achieved remission: Secondary | ICD-10-CM

## 2015-02-17 DIAGNOSIS — D469 Myelodysplastic syndrome, unspecified: Secondary | ICD-10-CM

## 2015-02-17 LAB — COMPREHENSIVE METABOLIC PANEL (CC13)
ALBUMIN: 2.9 g/dL — AB (ref 3.5–5.0)
ALT: 12 U/L (ref 0–55)
ANION GAP: 4 meq/L (ref 3–11)
AST: 11 U/L (ref 5–34)
Alkaline Phosphatase: 48 U/L (ref 40–150)
BUN: 21.2 mg/dL (ref 7.0–26.0)
CO2: 24 meq/L (ref 22–29)
CREATININE: 1.1 mg/dL (ref 0.7–1.3)
Calcium: 8.4 mg/dL (ref 8.4–10.4)
Chloride: 105 mEq/L (ref 98–109)
EGFR: 75 mL/min/{1.73_m2} — AB (ref >90)
GLUCOSE: 114 mg/dL (ref 70–140)
Potassium: 3.4 mEq/L — ABNORMAL LOW (ref 3.5–5.1)
Sodium: 133 mEq/L — ABNORMAL LOW (ref 136–145)
TOTAL PROTEIN: 8.6 g/dL — AB (ref 6.4–8.3)
Total Bilirubin: 0.42 mg/dL (ref 0.20–1.20)

## 2015-02-17 LAB — CBC WITH DIFFERENTIAL/PLATELET
BASO%: 0.2 % (ref 0.0–2.0)
Basophils Absolute: 0 10*3/uL (ref 0.0–0.1)
EOS%: 0.1 % (ref 0.0–7.0)
Eosinophils Absolute: 0 10*3/uL (ref 0.0–0.5)
HCT: 30.4 % — ABNORMAL LOW (ref 38.4–49.9)
HEMOGLOBIN: 10.6 g/dL — AB (ref 13.0–17.1)
LYMPH#: 1.1 10*3/uL (ref 0.9–3.3)
LYMPH%: 81.6 % — AB (ref 14.0–49.0)
MCH: 33 pg (ref 27.2–33.4)
MCHC: 34.8 g/dL (ref 32.0–36.0)
MCV: 95 fL (ref 79.3–98.0)
MONO#: 0 10*3/uL — ABNORMAL LOW (ref 0.1–0.9)
MONO%: 1.9 % (ref 0.0–14.0)
NEUT#: 0.2 10*3/uL — CL (ref 1.5–6.5)
NEUT%: 16.2 % — AB (ref 39.0–75.0)
Platelets: 42 10*3/uL — ABNORMAL LOW (ref 140–400)
RBC: 3.2 10*6/uL — AB (ref 4.20–5.82)
RDW: 24.9 % — ABNORMAL HIGH (ref 11.0–14.6)
WBC: 1.3 10*3/uL — AB (ref 4.0–10.3)

## 2015-02-17 NOTE — Telephone Encounter (Signed)
Called and spoke with the family member. Gave new appts for this Friday

## 2015-02-18 ENCOUNTER — Ambulatory Visit: Payer: Self-pay

## 2015-02-20 ENCOUNTER — Other Ambulatory Visit: Payer: Self-pay

## 2015-02-20 ENCOUNTER — Ambulatory Visit: Payer: Self-pay | Admitting: Nurse Practitioner

## 2015-02-21 ENCOUNTER — Telehealth: Payer: Self-pay | Admitting: Oncology

## 2015-02-21 ENCOUNTER — Other Ambulatory Visit: Payer: Self-pay | Admitting: Nurse Practitioner

## 2015-02-21 ENCOUNTER — Telehealth: Payer: Self-pay | Admitting: Nurse Practitioner

## 2015-02-21 ENCOUNTER — Other Ambulatory Visit (HOSPITAL_BASED_OUTPATIENT_CLINIC_OR_DEPARTMENT_OTHER): Payer: Medicare HMO

## 2015-02-21 ENCOUNTER — Ambulatory Visit (HOSPITAL_BASED_OUTPATIENT_CLINIC_OR_DEPARTMENT_OTHER): Payer: Medicare HMO

## 2015-02-21 ENCOUNTER — Ambulatory Visit (HOSPITAL_BASED_OUTPATIENT_CLINIC_OR_DEPARTMENT_OTHER): Payer: Medicare HMO | Admitting: Nurse Practitioner

## 2015-02-21 VITALS — BP 113/61 | HR 71 | Temp 97.9°F | Resp 18

## 2015-02-21 DIAGNOSIS — Z5112 Encounter for antineoplastic immunotherapy: Secondary | ICD-10-CM | POA: Diagnosis not present

## 2015-02-21 DIAGNOSIS — D696 Thrombocytopenia, unspecified: Secondary | ICD-10-CM

## 2015-02-21 DIAGNOSIS — D709 Neutropenia, unspecified: Secondary | ICD-10-CM

## 2015-02-21 DIAGNOSIS — C9000 Multiple myeloma not having achieved remission: Secondary | ICD-10-CM

## 2015-02-21 DIAGNOSIS — R1012 Left upper quadrant pain: Secondary | ICD-10-CM

## 2015-02-21 DIAGNOSIS — K5900 Constipation, unspecified: Secondary | ICD-10-CM

## 2015-02-21 DIAGNOSIS — D469 Myelodysplastic syndrome, unspecified: Secondary | ICD-10-CM

## 2015-02-21 LAB — CBC WITH DIFFERENTIAL/PLATELET
BASO%: 0.2 % (ref 0.0–2.0)
BASOS ABS: 0 10*3/uL (ref 0.0–0.1)
EOS%: 0.2 % (ref 0.0–7.0)
Eosinophils Absolute: 0 10*3/uL (ref 0.0–0.5)
HEMATOCRIT: 29.4 % — AB (ref 38.4–49.9)
HEMOGLOBIN: 10 g/dL — AB (ref 13.0–17.1)
LYMPH#: 1.5 10*3/uL (ref 0.9–3.3)
LYMPH%: 84.7 % — ABNORMAL HIGH (ref 14.0–49.0)
MCH: 33.3 pg (ref 27.2–33.4)
MCHC: 34.1 g/dL (ref 32.0–36.0)
MCV: 97.6 fL (ref 79.3–98.0)
MONO#: 0 10*3/uL — ABNORMAL LOW (ref 0.1–0.9)
MONO%: 1.3 % (ref 0.0–14.0)
NEUT#: 0.2 10*3/uL — CL (ref 1.5–6.5)
NEUT%: 13.6 % — ABNORMAL LOW (ref 39.0–75.0)
Platelets: 19 10*3/uL — ABNORMAL LOW (ref 140–400)
RBC: 3.01 10*6/uL — ABNORMAL LOW (ref 4.20–5.82)
RDW: 24.9 % — AB (ref 11.0–14.6)
WBC: 1.7 10*3/uL — AB (ref 4.0–10.3)

## 2015-02-21 LAB — HOLD TUBE, BLOOD BANK

## 2015-02-21 MED ORDER — ACETAMINOPHEN 325 MG PO TABS
650.0000 mg | ORAL_TABLET | Freq: Once | ORAL | Status: AC
  Administered 2015-02-21: 650 mg via ORAL

## 2015-02-21 MED ORDER — SODIUM CHLORIDE 0.9 % IV SOLN
Freq: Once | INTRAVENOUS | Status: AC
  Administered 2015-02-21: 10:00:00 via INTRAVENOUS

## 2015-02-21 MED ORDER — SODIUM CHLORIDE 0.9 % IV SOLN
Freq: Once | INTRAVENOUS | Status: AC
  Administered 2015-02-21: 10:00:00 via INTRAVENOUS
  Filled 2015-02-21: qty 4

## 2015-02-21 MED ORDER — METHYLPREDNISOLONE SODIUM SUCC 125 MG IJ SOLR
INTRAMUSCULAR | Status: AC
Start: 2015-02-21 — End: 2015-02-21
  Filled 2015-02-21: qty 2

## 2015-02-21 MED ORDER — ACETAMINOPHEN 325 MG PO TABS
ORAL_TABLET | ORAL | Status: AC
  Filled 2015-02-21: qty 2

## 2015-02-21 MED ORDER — PANTOPRAZOLE SODIUM 40 MG PO TBEC: 40.0000 mg | DELAYED_RELEASE_TABLET | Freq: Two times a day (BID) | ORAL | Status: AC

## 2015-02-21 MED ORDER — DIPHENHYDRAMINE HCL 25 MG PO CAPS
50.0000 mg | ORAL_CAPSULE | Freq: Once | ORAL | Status: AC
  Administered 2015-02-21: 50 mg via ORAL

## 2015-02-21 MED ORDER — DEXAMETHASONE 4 MG PO TABS: ORAL_TABLET | ORAL | Status: DC

## 2015-02-21 MED ORDER — DIPHENHYDRAMINE HCL 25 MG PO CAPS
ORAL_CAPSULE | ORAL | Status: AC
  Filled 2015-02-21: qty 2

## 2015-02-21 MED ORDER — CIPROFLOXACIN HCL 500 MG PO TABS: 500.0000 mg | ORAL_TABLET | Freq: Two times a day (BID) | ORAL | Status: DC

## 2015-02-21 MED ORDER — SODIUM CHLORIDE 0.9 % IV SOLN
1600.0000 mg | Freq: Once | INTRAVENOUS | Status: AC
  Administered 2015-02-21: 1600 mg via INTRAVENOUS
  Filled 2015-02-21: qty 80

## 2015-02-21 MED ORDER — METHYLPREDNISOLONE SODIUM SUCC 125 MG IJ SOLR
125.0000 mg | Freq: Once | INTRAMUSCULAR | Status: AC
  Administered 2015-02-21: 125 mg via INTRAVENOUS

## 2015-02-21 NOTE — Telephone Encounter (Signed)
[name] appointment cancelled per pof,chemo appointments made and inbox to [facility] to help with 8/19 chemo

## 2015-02-21 NOTE — Patient Instructions (Addendum)
[facility] Discharge Instructions for Patients  Today you received the following: Daratumumab.  To help prevent nausea and vomiting after your treatment, we encourage you to take your nausea medication as directed.    If you develop nausea and vomiting that is not controlled by your nausea medication, call the clinic.   BELOW ARE SYMPTOMS THAT SHOULD BE REPORTED IMMEDIATELY:  *FEVER GREATER THAN 100.5 F  *CHILLS WITH OR WITHOUT FEVER  NAUSEA AND VOMITING THAT IS NOT CONTROLLED WITH YOUR NAUSEA MEDICATION  *UNUSUAL SHORTNESS OF BREATH  *UNUSUAL BRUISING OR BLEEDING  TENDERNESS IN MOUTH AND THROAT WITH OR WITHOUT PRESENCE OF ULCERS  *URINARY PROBLEMS  *BOWEL PROBLEMS  UNUSUAL RASH Items with * indicate a potential emergency and should be followed up as soon as possible.  Feel free to call the clinic you have any questions or concerns. The clinic phone number is [phone].  Please show the [place] at check-in to the Emergency Department and triage nurse.  Take decadron 20 mg =4 tabs of 4 mg today & weekly as prescribed & take one 4 mg tab tomorrow twice daily x 2 days.  Start antibiotic-cipro 500 mg twice daily. Take decadron with food or milk & take mylanta/maalox as needed for stomach pain/indigestion.

## 2015-02-21 NOTE — Telephone Encounter (Signed)
Appointments made and patient will get a new avs in chemo °

## 2015-02-21 NOTE — Progress Notes (Signed)
[place] OFFICE PROGRESS NOTE   Diagnosis: Multiple myeloma, myelodysplasia   INTERVAL HISTORY:   [name] returns as scheduled. He began treatment with Daratumumab 02/13/2015. He denies nausea/vomiting. No mouth sores. No diarrhea. He tolerated the infusion without difficulty. No signs of a reaction. He has intermittent constipation. The constipation is relieved with lactulose. He denies any fever. No bleeding. 3-4 days ago he noted onset of pain at the mid to left upper abdomen. The pain improves when he eats. He continues to note a burning discomfort in the left leg.  Objective:  Vital signs in last 24 hours:  Temperature 98, heart rate 93, respirations 20, blood pressure 110/60    Resp:  Lungs clear bilaterally. Cardio:  Regular rate and rhythm. [name]:  Abdomen soft and nontender. No organomegaly. Vascular:  No leg edema.    Lab Results:  Lab Results  Component Value Date   WBC 1.7* 02/21/2015   HGB 10.0* 02/21/2015   HCT 29.4* 02/21/2015   MCV 97.6 02/21/2015   PLT 19* 02/21/2015   NEUTROABS 0.2* 02/21/2015    Imaging:  No results found.  Medications: I have reviewed the patient's current medications.  Assessment/Plan: 1. Multiple myeloma, status post treatment with melphalan/prednisone/thalidomide beginning in November 2009. The thalidomide was increased to 200 mg daily beginning 09/11/2008. The serum M spike was slightly improved on 03/18/2009 and slightly increased on 05/09/2009. He has been maintained off of specific therapy for multiple myeloma since September 2010. The serum M spike was increased on 10/20/2011 at 4.6. He began Revlimid on 11/11/2011 with weekly dexamethasone. He began cycle 2 of Revlimid on 12/09/2011. The serum M spike was improved on 12/31/2011. The serum M spike was slightly higher on 01/28/2012. He began cycle 4 on 02/03/2012. He developed recurrent hypercalcemia. Treatment was changed to Cytoxan/Velcade/Decadron beginning  03/02/2012. Cytoxan discontinued in January 2015  Treatment continued with Velcade/Decadron.   [name] serum M spike and IgG 10/04/2013.   [name] serum M spike 12/06/2013   Treatment placed on hold 01/10/2014 secondary to thrombocytopenia and a slightly higher M spike   Serum M spike and IgG increased 03/07/2014.  Cycle 1 Pomalidomide/dexamethasone 04/01/2014.  Pomalidomide subsequently placed on hold due to cytopenias.  Pomalidomide resumed at a reduced dose 05/23/2014.  Pomalidomide placed on hold 06/04/2014 due to progressive pancytopenia.  Bone marrow biopsy 01/23/2015 showed 60% atypical plasma cells. Myeloid cells in the background with dyspoietic changes associated with increased myeloblastic cells representing up to 11% of all cells.  Daratumumab started 02/13/2015 2. Chronic "dizziness."  3. Chest x-ray 11/01/2008 with a patchy opacity at the right midlung suspicious for pneumonia. He was treated with Avelox. 4. Low back pain with a radicular component. An MRI on 10/07/2008 showed no involvement of the lumbosacral spine with myeloma. 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 5. Bilateral neck pain, status post a CT 05/17/2008 with findings of spinal stenosis and osteoarthritis. 6. 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 Red cell microcytosis, likely related to hemoglobin C trait. A ferritin level returned elevated and a stool Hemoccult was negative 01/23/2010. He underwent a colonoscopy in 2009. 8. Elevated beta-2 microglobulin level. 9. History of hypertension. Blood pressure medication placed on hold December 2015 due to hypotension. 10. History of a herniated disk at L4-L5 on MRI an 01/11/2002. 11. Hyperlipidemia. 12. Gastroesophageal reflux disease. 13. History of atypical angina. 14. History of  rectal  bleeding-large external hemorrhoids noted 02/25/2012. The stool was Hemoccult positive. He has a history of colon polyps. He is followed by [name]. He was diagnosed with hemorrhoids in September of 2013 while hospitalized. He underwent a band procedure 03/09/2013. He has had no further rectal bleeding. 15. Superficial venous thrombosis of the greater saphenous vein on a Doppler 09/27/2008. Negative for deep vein thrombosis. Symptoms improved with aspirin. 16. Neck pain with numbness/tingling in the arms, hands, and low back with proximal right leg weakness. An MRI of the cervical spine 05/30/2009 showed multilevel spondylosis with mild cord edema at C4-C5 and C5-C6. He was noted to have an enhancing lesion of the cord at T3-T4 of unclear etiology. In the lumbar spine there was no change in the spondylosis at L3-L4 and L4-L5. There was no involvement of the cervical or lumbar spine with myeloma. He is status post C4-C5, C5-C6, and C6-C7 anterior cervical diskectomy with fusion by [name] 08/01/2009. 17. Thrombocytopenia-progressive, secondary to myelodysplasia and 5 azacytidine. 18. Indeterminate-age deep vein thrombosis of the left lower extremity on a venous Doppler 08/15/2009. There were also findings consistent with superficial thrombosis involving the right lower extremity 08/15/2009. Coumadin was discontinued in October 2011. 19. Type 2 diabetes. Now on an oral hypoglycemic 20. History of hematuria, followed at [name]. Urinalysis was negative for blood on 07/23/2011. 21. Radicular back pain with MRI of the lumbar spine on 10/19/2011 showing nerve root impingement at L2-L3, L3-L4 and L4-L5 with the most severe at the right L3 nerve roots due to a large disc fragment. Associated right leg weakness. He underwent right L2-3 decompressive laminotomy with right L2 and L3 decompressive foraminotomy; right L2-3 microdiscectomy on 11/01/2011. 22. Constipation. He continues a laxative  regimen. 23. Hospitalization 11/08/2011 through 11/10/2011 with orthostatic hypotension/dizziness. He improved with intravenous hydration. He had mild hypotension when here on 11/18/2011. We recommended discontinuing Norvasc and Proscar. 24. Hypercalcemia status post pamidronate 11/03/2011. Recurrent hypercalcemia 02/16/2012 status post Zometa. The calcium has remained in normal range. 25. Fractured tooth with associated pain. Status post a tooth extraction by [name], Zometa was placed on hold. 26. Exertional dyspnea. [name]. He was referred to cardiology, pulmonary function studies 06/15/2013 showed a moderately severe diffusion defect. He utilizes supplemental oxygen as needed. He is followed by pulmonary. 27. 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 28. 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   Status post cycle 1 5-azacytidine beginning 07/08/2014 4 days (day 5 held).  Cycle 2 5-azacytidine beginning 08/05/2014 5 days with Neulasta support.  Cycle 3 5-azacytidine beginning 09/02/2014 with Neulasta support.  Cycle 4 5 azacytidine beginning 09/30/2014 with Neulasta support  Cycle 5 5-azacytidine beginning 10/28/2014 with Neulasta support  Cycle 6 5-azacytidine beginning 11/25/2014 with Neulasta support  Cycle 7 5-azacytidine beginning 12/23/2014 with Neulasta support  Treatment placed on hold 01/20/2015 due to progressive pancytopenia. Referred for a bone marrow.  Bone marrow biopsy 01/23/2015 showed 60% atypical plasma cells. Myeloid cells in the background with dyspoietic changes associated with increased myeloblastic cells representing up to 11% of all cells. 29. History of hypotension. Blood pressure  medication placed on hold December 2015. 30. Severe neutropenia/thrombocytopenia-most likely secondary to progression of multiple myeloma     Disposition: Mr. Sthilaire appears unchanged. He seems to have tolerated the first Daratumumab  infusion well. Plan to proceed with week 2 today as scheduled.  He continues to have severe neutropenia and thrombocytopenia. He will begin prophylactic ciprofloxacin 500 mg twice daily. He understands to call with fever or other signs of infection. We also reviewed bleeding precautions.  The upper abdominal discomfort may be early gastritis related to the steroids. He will begin Protonix 40 mg twice daily.  He will return for a CBC on 02/24/2015. He will return for a follow-up visit and week 3 Daratumumab  On 02/28/2015.  Plan reviewed with [name].  [name] ANP/GNP-BC   02/21/2015  9:51 AM

## 2015-02-23 ENCOUNTER — Other Ambulatory Visit: Payer: Self-pay | Admitting: Oncology

## 2015-02-24 ENCOUNTER — Ambulatory Visit: Payer: Self-pay

## 2015-02-24 ENCOUNTER — Telehealth: Payer: Self-pay | Admitting: *Deleted

## 2015-02-24 ENCOUNTER — Ambulatory Visit: Payer: Self-pay | Admitting: Nurse Practitioner

## 2015-02-24 ENCOUNTER — Other Ambulatory Visit (HOSPITAL_BASED_OUTPATIENT_CLINIC_OR_DEPARTMENT_OTHER): Payer: Medicare HMO

## 2015-02-24 DIAGNOSIS — D469 Myelodysplastic syndrome, unspecified: Secondary | ICD-10-CM

## 2015-02-24 DIAGNOSIS — C9000 Multiple myeloma not having achieved remission: Secondary | ICD-10-CM | POA: Diagnosis not present

## 2015-02-24 LAB — CBC WITH DIFFERENTIAL/PLATELET
BASO%: 0.5 % (ref 0.0–2.0)
Basophils Absolute: 0 10*3/uL (ref 0.0–0.1)
EOS%: 0 % (ref 0.0–7.0)
Eosinophils Absolute: 0 10*3/uL (ref 0.0–0.5)
HCT: 29.6 % — ABNORMAL LOW (ref 38.4–49.9)
HGB: 10.3 g/dL — ABNORMAL LOW (ref 13.0–17.1)
LYMPH%: 79.9 % — ABNORMAL HIGH (ref 14.0–49.0)
MCH: 33.8 pg — ABNORMAL HIGH (ref 27.2–33.4)
MCHC: 34.7 g/dL (ref 32.0–36.0)
MCV: 97.4 fL (ref 79.3–98.0)
MONO#: 0 10*3/uL — ABNORMAL LOW (ref 0.1–0.9)
MONO%: 2 % (ref 0.0–14.0)
NEUT#: 0.2 10*3/uL — CL (ref 1.5–6.5)
NEUT%: 17.6 % — ABNORMAL LOW (ref 39.0–75.0)
Platelets: 15 10*3/uL — ABNORMAL LOW (ref 140–400)
RBC: 3.03 10*6/uL — ABNORMAL LOW (ref 4.20–5.82)
RDW: 24.5 % — ABNORMAL HIGH (ref 11.0–14.6)
WBC: 1.3 10*3/uL — ABNORMAL LOW (ref 4.0–10.3)
lymph#: 1 10*3/uL (ref 0.9–3.3)

## 2015-02-24 NOTE — Telephone Encounter (Signed)
Per staff message and POF I have scheduled appts. Advised scheduler of appts. JMW  

## 2015-02-25 ENCOUNTER — Ambulatory Visit: Payer: Self-pay

## 2015-02-25 ENCOUNTER — Other Ambulatory Visit: Payer: Self-pay | Admitting: Family

## 2015-02-26 NOTE — Telephone Encounter (Signed)
last seen 09/13/14 [name]  Requesting 90 day supply

## 2015-02-28 ENCOUNTER — Telehealth: Payer: Self-pay | Admitting: Oncology

## 2015-02-28 ENCOUNTER — Ambulatory Visit (HOSPITAL_BASED_OUTPATIENT_CLINIC_OR_DEPARTMENT_OTHER): Payer: Medicare HMO | Admitting: Oncology

## 2015-02-28 ENCOUNTER — Ambulatory Visit (HOSPITAL_BASED_OUTPATIENT_CLINIC_OR_DEPARTMENT_OTHER): Payer: Medicare HMO

## 2015-02-28 ENCOUNTER — Other Ambulatory Visit (HOSPITAL_BASED_OUTPATIENT_CLINIC_OR_DEPARTMENT_OTHER): Payer: Medicare HMO

## 2015-02-28 ENCOUNTER — Ambulatory Visit (HOSPITAL_COMMUNITY)
Admission: RE | Admit: 2015-02-28 | Discharge: 2015-02-28 | Disposition: A | Discharge status: Home / Self Care | Payer: Medicare HMO | Source: Ambulatory Visit | Attending: Oncology | Admitting: Oncology

## 2015-02-28 VITALS — BP 119/58 | HR 89 | Temp 98.4°F | Resp 20

## 2015-02-28 VITALS — BP 115/79 | HR 98 | Temp 97.3°F | Resp 18 | Ht 72.0 in | Wt 225.9 lb

## 2015-02-28 DIAGNOSIS — C9000 Multiple myeloma not having achieved remission: Secondary | ICD-10-CM

## 2015-02-28 DIAGNOSIS — E119 Type 2 diabetes mellitus without complications: Secondary | ICD-10-CM | POA: Diagnosis not present

## 2015-02-28 DIAGNOSIS — D61818 Other pancytopenia: Secondary | ICD-10-CM

## 2015-02-28 DIAGNOSIS — R0602 Shortness of breath: Secondary | ICD-10-CM | POA: Insufficient documentation

## 2015-02-28 DIAGNOSIS — D696 Thrombocytopenia, unspecified: Secondary | ICD-10-CM

## 2015-02-28 DIAGNOSIS — D469 Myelodysplastic syndrome, unspecified: Secondary | ICD-10-CM

## 2015-02-28 DIAGNOSIS — E860 Dehydration: Secondary | ICD-10-CM | POA: Diagnosis not present

## 2015-02-28 DIAGNOSIS — D709 Neutropenia, unspecified: Secondary | ICD-10-CM | POA: Diagnosis not present

## 2015-02-28 DIAGNOSIS — Z87891 Personal history of nicotine dependence: Secondary | ICD-10-CM | POA: Insufficient documentation

## 2015-02-28 DIAGNOSIS — T451X5A Adverse effect of antineoplastic and immunosuppressive drugs, initial encounter: Secondary | ICD-10-CM

## 2015-02-28 DIAGNOSIS — R197 Diarrhea, unspecified: Secondary | ICD-10-CM

## 2015-02-28 DIAGNOSIS — R079 Chest pain, unspecified: Secondary | ICD-10-CM | POA: Diagnosis not present

## 2015-02-28 DIAGNOSIS — D61810 Antineoplastic chemotherapy induced pancytopenia: Secondary | ICD-10-CM

## 2015-02-28 LAB — CBC WITH DIFFERENTIAL/PLATELET
BASO%: 0.7 % (ref 0.0–2.0)
BASOS ABS: 0 10*3/uL (ref 0.0–0.1)
EOS%: 0.3 % (ref 0.0–7.0)
Eosinophils Absolute: 0 10*3/uL (ref 0.0–0.5)
HCT: 28.1 % — ABNORMAL LOW (ref 38.4–49.9)
HEMOGLOBIN: 9.7 g/dL — AB (ref 13.0–17.1)
LYMPH%: 71.7 % — ABNORMAL HIGH (ref 14.0–49.0)
MCH: 34 pg — AB (ref 27.2–33.4)
MCHC: 34.5 g/dL (ref 32.0–36.0)
MCV: 98.6 fL — ABNORMAL HIGH (ref 79.3–98.0)
MONO#: 0 10*3/uL — ABNORMAL LOW (ref 0.1–0.9)
MONO%: 4.5 % (ref 0.0–14.0)
NEUT%: 22.8 % — ABNORMAL LOW (ref 39.0–75.0)
NEUTROS ABS: 0.1 10*3/uL — AB (ref 1.5–6.5)
Platelets: 6 10*3/uL — CL (ref 140–400)
RBC: 2.85 10*6/uL — ABNORMAL LOW (ref 4.20–5.82)
RDW: 24.1 % — AB (ref 11.0–14.6)
WBC: 0.6 10*3/uL — AB (ref 4.0–10.3)
lymph#: 0.5 10*3/uL — ABNORMAL LOW (ref 0.9–3.3)

## 2015-02-28 LAB — COMPREHENSIVE METABOLIC PANEL (CC13)
ALBUMIN: 2.9 g/dL — AB (ref 3.5–5.0)
ALK PHOS: 47 U/L (ref 40–150)
ALT: 14 U/L (ref 0–55)
AST: 18 U/L (ref 5–34)
Anion Gap: 12 mEq/L — ABNORMAL HIGH (ref 3–11)
BUN: 30.6 mg/dL — AB (ref 7.0–26.0)
CO2: 16 mEq/L — ABNORMAL LOW (ref 22–29)
Calcium: 8.9 mg/dL (ref 8.4–10.4)
Chloride: 100 mEq/L (ref 98–109)
Creatinine: 1.5 mg/dL — ABNORMAL HIGH (ref 0.7–1.3)
EGFR: 52 mL/min/{1.73_m2} — ABNORMAL LOW (ref >90)
GLUCOSE: 173 mg/dL — AB (ref 70–140)
POTASSIUM: 4.7 meq/L (ref 3.5–5.1)
SODIUM: 128 meq/L — AB (ref 136–145)
Total Bilirubin: 0.63 mg/dL (ref 0.20–1.20)
Total Protein: 8.4 g/dL — ABNORMAL HIGH (ref 6.4–8.3)

## 2015-02-28 MED ORDER — SODIUM CHLORIDE 0.9 % IV SOLN
Freq: Once | INTRAVENOUS | Status: AC
  Administered 2015-02-28: 12:00:00 via INTRAVENOUS

## 2015-02-28 MED ORDER — SODIUM CHLORIDE 0.9 % IV SOLN
250.0000 mL | Freq: Once | INTRAVENOUS | Status: AC
  Administered 2015-02-28: 250 mL via INTRAVENOUS

## 2015-02-28 MED ORDER — SODIUM CHLORIDE 0.9 % IJ SOLN
10.0000 mL | INTRAMUSCULAR | Status: DC | PRN
  Filled 2015-02-28: qty 10

## 2015-02-28 NOTE — Progress Notes (Signed)
Pt to receive 1 unit of platelets today per Dr. Benay Spice, no pre-meds. Blood bank made aware and will call infusion when platelets are ready. Pt to receive IVFs based on CXR results. No daratumumab today. IgG lab added and will be sent to infusion as well to be drawn. Amy Horton, Charge RN made aware and will forward to RN taking care of pt. Pt sent to radiology via wheelchair with wife, then will arrive to infusion.  1100- CXR shows nothing acute (No PNA or CHF); per Dr. Benay Spice, order 1 L NS over 2-3 hrs. Juliann Pulse, Infusion RN to let RN taking care of patient know.

## 2015-02-28 NOTE — Progress Notes (Signed)
[facility] OFFICE PROGRESS NOTE   Diagnosis: Multiple myeloma, myelodysplasia  INTERVAL HISTORY:   [name] returns as scheduled. He completed another treatment with Daratumumab on 02/21/2015. He reports increased "weakness "for the past several days. He has experienced several episodes of diarrhea. Good appetite. He reports increased dyspnea. No fever. No bleeding. He continues to have intermittent burning pain in the left upper leg.  Objective:  Vital signs in last 24 hours:  Blood pressure 115/79, pulse 98, temperature 97.3 F (36.3 C), temperature source Oral, resp. rate 18, height 6' (1.829 m), weight 225 lb 14.4 oz (102.468 kg), SpO2 100 %.    HEENT: No thrush or bleeding Resp: Lungs clear bilaterally, increased respiratory rate Cardio: Regular rate and rhythm GI: No hepatomegaly, no spinal megaly, nontender, no mass Vascular: No leg edema      Lab Results:  Lab Results  Component Value Date   WBC 0.6* 02/28/2015   HGB 9.7* 02/28/2015   HCT 28.1* 02/28/2015   MCV 98.6* 02/28/2015   PLT 6* 02/28/2015   NEUTROABS 0.1* 02/28/2015    Potassium 4.7, BUN 30.6, creatinine 1.5, calcium 8.9, CO2 16, AST 18, ALT 14, total protein 8.4, bili was 0.63   Medications: I have reviewed the patient's current medications.  Assessment/Plan: 1. Multiple myeloma, status post treatment with melphalan/prednisone/thalidomide beginning in November 2009. The thalidomide was increased to 200 mg daily beginning 09/11/2008. The serum M spike was slightly improved on 03/18/2009 and slightly increased on 05/09/2009. He has been maintained off of specific therapy for multiple myeloma since September 2010. The serum M spike was increased on 10/20/2011 at 4.6. He began Revlimid on 11/11/2011 with weekly dexamethasone. He began cycle 2 of Revlimid on 12/09/2011. The serum M spike was improved on 12/31/2011. The serum M spike was slightly higher on 01/28/2012. He began cycle 4 on  02/03/2012. He developed recurrent hypercalcemia. Treatment was changed to Cytoxan/Velcade/Decadron beginning 03/02/2012. Cytoxan discontinued in January 2015  Treatment continued with Velcade/Decadron.   Stable serum M spike and IgG 10/04/2013.   Stable serum M spike 12/06/2013   Treatment placed on hold 01/10/2014 secondary to thrombocytopenia and a slightly higher M spike   Serum M spike and IgG increased 03/07/2014.  Cycle 1 Pomalidomide/dexamethasone 04/01/2014.  Pomalidomide subsequently placed on hold due to cytopenias.  Pomalidomide resumed at a reduced dose 05/23/2014.  Pomalidomide placed on hold 06/04/2014 due to progressive pancytopenia.  Bone marrow biopsy 01/23/2015 showed 60% atypical plasma cells. Myeloid cells in the background with dyspoietic changes associated with increased myeloblastic cells representing up to 11% of all cells.  Daratumumab started 02/13/2015 2. Chronic "dizziness."  3. Chest x-ray 11/01/2008 with a patchy opacity at the right midlung suspicious for pneumonia. He was treated with Avelox. 4. Low back pain with a radicular component. An MRI on 10/07/2008 showed no involvement of the lumbosacral spine with myeloma. 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 5. Bilateral neck pain, status post a CT 05/17/2008 with findings of spinal stenosis and osteoarthritis. 6. 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 Red cell microcytosis, likely related to hemoglobin C trait. A ferritin level returned elevated and a stool Hemoccult was negative 01/23/2010. He underwent a colonoscopy in 2009. 8. Elevated beta-2 microglobulin level. 9. History of hypertension. Blood pressure medication placed on hold December 2015 due to hypotension. 10. History of a herniated disk at L4-L5 on MRI  an  01/11/2002. 11. Hyperlipidemia. 12. Gastroesophageal reflux disease. 13. History of atypical angina. 14. History of rectal bleeding-large external hemorrhoids noted 02/25/2012. The stool was Hemoccult positive. He has a history of colon polyps. He is followed by [facility]. He was diagnosed with hemorrhoids in September of 2013 while hospitalized. He underwent a band procedure 03/09/2013. He has had no further rectal bleeding. 15. Superficial venous thrombosis of the greater saphenous vein on a Doppler 09/27/2008. Negative for deep vein thrombosis. Symptoms improved with aspirin. 16. Neck pain with numbness/tingling in the arms, hands, and low back with proximal right leg weakness. An MRI of the cervical spine 05/30/2009 showed multilevel spondylosis with mild cord edema at C4-C5 and C5-C6. He was noted to have an enhancing lesion of the cord at T3-T4 of unclear etiology. In the lumbar spine there was no change in the spondylosis at L3-L4 and L4-L5. There was no involvement of the cervical or lumbar spine with myeloma. He is status post C4-C5, C5-C6, and C6-C7 anterior cervical diskectomy with fusion by Dr. Annette Stable 08/01/2009. 17. Thrombocytopenia-progressive, secondary to myelodysplasia and 5 azacytidine. 18. Indeterminate-age deep vein thrombosis of the left lower extremity on a venous Doppler 08/15/2009. There were also findings consistent with superficial thrombosis involving the right lower extremity 08/15/2009. Coumadin was discontinued in October 2011. 19. Type 2 diabetes. Now on an oral hypoglycemic 20. History of hematuria, followed at [facility]. Urinalysis was negative for blood on 07/23/2011. 21. Radicular back pain with MRI of the lumbar spine on 10/19/2011 showing nerve root impingement at L2-L3, L3-L4 and L4-L5 with the most severe at the right L3 nerve roots due to a large disc fragment. Associated right leg weakness. He underwent right L2-3 decompressive laminotomy with right L2 and L3  decompressive foraminotomy; right L2-3 microdiscectomy on 11/01/2011. 22. Constipation. He continues a laxative regimen. 23. Hospitalization 11/08/2011 through 11/10/2011 with orthostatic hypotension/dizziness. He improved with intravenous hydration. He had mild hypotension when here on 11/18/2011. We recommended discontinuing Norvasc and Proscar. 24. Hypercalcemia status post pamidronate 11/03/2011. Recurrent hypercalcemia 02/16/2012 status post Zometa. The calcium has remained in normal range. 25. Fractured tooth with associated pain. Status post a tooth extraction by Dr. Enrique Sack, Zometa was placed on hold. 26. Exertional dyspnea. Stable. He was referred to cardiology, pulmonary function studies 06/15/2013 showed a moderately severe diffusion defect. He utilizes supplemental oxygen as needed. He is followed by pulmonary. 27. 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 28. 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   Status post cycle 1 5-azacytidine beginning 07/08/2014 4 days (day 5 held).  Cycle 2 5-azacytidine beginning 08/05/2014 5 days with Neulasta support.  Cycle 3 5-azacytidine beginning 09/02/2014 with Neulasta support.  Cycle 4 5 azacytidine beginning 09/30/2014 with Neulasta support  Cycle 5 5-azacytidine beginning 10/28/2014 with Neulasta support  Cycle 6 5-azacytidine beginning 11/25/2014 with Neulasta support  Cycle 7 5-azacytidine beginning 12/23/2014 with Neulasta support  Treatment placed on hold 01/20/2015 due to progressive pancytopenia. Referred for a bone marrow.  Bone marrow biopsy 01/23/2015 showed 60% atypical plasma cells. Myeloid cells in the background with dyspoietic changes associated with  increased myeloblastic cells representing up to 11% of all cells. 29. History of hypotension. Blood pressure medication placed on hold December 2015. 30. Severe neutropenia/thrombocytopenia-most likely secondary to progression of multiple myeloma 31.  Diarrhea/dehydration-he will receive intravenous fluids today. I encouraged him to discontinue lactulose and MiraLAX   Disposition:  [name] has persistent severe pancytopenia. He will receive a platelet transfusion today. He has experienced a significant decline in his performance status over the past week. He does not have an apparent source for infection. A chest x-ray today showed no acute change.  He will receive intravenous fluids today. Daratumumab will be placed on hold. He will return for an office visit 03/03/2015. He knows to contact us for a fever or bleeding.  Betsy Coder, MD  02/28/2015  2:54 PM  Addendum: I saw Mr. Bernardini in the chemotherapy room after he received intravenous fluids and platelets today. His clinical status appeared improved. He will return as scheduled 03/03/2015. I encouraged him to push fluids.

## 2015-02-28 NOTE — Telephone Encounter (Signed)
MD added 08/22 per 08/19 POF, request sent to give pt updated calendar in chemo room.... KJ

## 2015-02-28 NOTE — Telephone Encounter (Signed)
Gave adn printed appt sched and avs for pt for Aug °

## 2015-02-28 NOTE — Patient Instructions (Signed)
Blood Transfusion Information WHAT IS A BLOOD TRANSFUSION? A transfusion is the replacement of blood or some of its parts. Blood is made up of multiple cells which provide different functions.  Red blood cells carry oxygen and are used for blood loss replacement.  White blood cells fight against infection.  Platelets control bleeding.  Plasma helps clot blood.  Other blood products are available for specialized needs, such as hemophilia or other clotting disorders. BEFORE THE TRANSFUSION  Who gives blood for transfusions?   You may be able to donate blood to be used at a later date on yourself (autologous donation).  Relatives can be asked to donate blood. This is generally not any safer than if you have received blood from a stranger. The same precautions are taken to ensure safety when a relative's blood is donated.  Healthy volunteers who are fully evaluated to make sure their blood is safe. This is blood bank blood. Transfusion therapy is the safest it has ever been in the practice of medicine. Before blood is taken from a donor, a complete history is taken to make sure that person has no history of diseases nor engages in risky social behavior (examples are intravenous drug use or sexual activity with multiple partners). The donor's travel history is screened to minimize risk of transmitting infections, such as malaria. The donated blood is tested for signs of infectious diseases, such as HIV and hepatitis. The blood is then tested to be sure it is compatible with you in order to minimize the chance of a transfusion reaction. If you or a relative donates blood, this is often done in anticipation of surgery and is not appropriate for emergency situations. It takes many days to process the donated blood. RISKS AND COMPLICATIONS Although transfusion therapy is very safe and saves many lives, the main dangers of transfusion include:   Getting an infectious disease.  Developing a  transfusion reaction. This is an allergic reaction to something in the blood you were given. Every precaution is taken to prevent this. The decision to have a blood transfusion has been considered carefully by your caregiver before blood is given. Blood is not given unless the benefits outweigh the risks. AFTER THE TRANSFUSION  Right after receiving a blood transfusion, you will usually feel much better and more energetic. This is especially true if your red blood cells have gotten low (anemic). The transfusion raises the level of the red blood cells which carry oxygen, and this usually causes an energy increase.  The nurse administering the transfusion will monitor you carefully for complications. HOME CARE INSTRUCTIONS  No special instructions are needed after a transfusion. You may find your energy is better. Speak with your caregiver about any limitations on activity for underlying diseases you may have. SEEK MEDICAL CARE IF:   Your condition is not improving after your transfusion.  You develop redness or irritation at the intravenous (IV) site. SEEK IMMEDIATE MEDICAL CARE IF:  Any of the following symptoms occur over the next 12 hours:  Shaking chills.  You have a temperature by mouth above 102 F (38.9 C), not controlled by medicine.  Chest, back, or muscle pain.  People around you feel you are not acting correctly or are confused.  Shortness of breath or difficulty breathing.  Dizziness and fainting.  You get a rash or develop hives.  You have a decrease in urine output.  Your urine turns a dark color or changes to pink, red, or brown. Any of the following   symptoms occur over the next 10 days:  You have a temperature by mouth above 102 F (38.9 C), not controlled by medicine.  Shortness of breath.  Weakness after normal activity.  The white part of the eye turns yellow (jaundice).  You have a decrease in the amount of urine or are urinating less often.  Your  urine turns a dark color or changes to pink, red, or brown. Document Released: 06/25/2000 Document Revised: 09/20/2011 Document Reviewed: 02/12/2008 [facility] Patient Information 2015 [place], [place]. This information is not intended to replace advice given to you by your health care provider. Make sure you discuss any questions you have with your health care provider. Dehydration, Adult Dehydration means your body does not have as much fluid as it needs. Your kidneys, brain, and heart will not work properly without the right amount of fluids and salt.  HOME CARE  Ask your doctor how to replace body fluid losses (rehydrate).  Drink enough fluids to keep your pee (urine) clear or pale yellow.  Drink small amounts of fluids often if you feel sick to your stomach (nauseous) or throw up (vomit).  Eat like you normally do.  Avoid:  Foods or drinks high in sugar.  Bubbly (carbonated) drinks.  Juice.  Very hot or cold fluids.  Drinks with caffeine.  Fatty, greasy foods.  Alcohol.  Tobacco.  Eating too much.  Gelatin desserts.  Wash your hands to avoid spreading germs (bacteria, viruses).  Only take medicine as told by your doctor.  Keep all doctor visits as told. GET HELP RIGHT AWAY IF:   You cannot drink something without throwing up.  You get worse even with treatment.  Your vomit has blood in it or looks greenish.  Your poop (stool) has blood in it or looks black and tarry.  You have not peed in 6 to 8 hours.  You pee a small amount of very dark pee.  You have a fever.  You pass out (faint).  You have belly (abdominal) pain that gets worse or stays in one spot (localizes).  You have a rash, stiff neck, or bad headache.  You get easily annoyed, sleepy, or are hard to wake up.  You feel weak, dizzy, or very thirsty. MAKE SURE YOU:   Understand these instructions.  Will watch your condition.  Will get help right away if you are not doing well or get  worse. Document Released: 04/24/2009 Document Revised: 09/20/2011 Document Reviewed: 02/15/2011 [facility] Patient Information 2015 [place], [place]. This information is not intended to replace advice given to you by your health care provider. Make sure you discuss any questions you have with your health care provider.

## 2015-03-02 LAB — PREPARE PLATELET PHERESIS: Unit division: 0

## 2015-03-03 ENCOUNTER — Telehealth: Payer: Self-pay | Admitting: Oncology

## 2015-03-03 ENCOUNTER — Other Ambulatory Visit (HOSPITAL_BASED_OUTPATIENT_CLINIC_OR_DEPARTMENT_OTHER): Payer: Medicare HMO

## 2015-03-03 ENCOUNTER — Ambulatory Visit (HOSPITAL_BASED_OUTPATIENT_CLINIC_OR_DEPARTMENT_OTHER): Payer: Self-pay | Admitting: Oncology

## 2015-03-03 VITALS — BP 109/66 | HR 77 | Temp 97.7°F | Resp 18 | Ht 72.0 in | Wt 224.7 lb

## 2015-03-03 DIAGNOSIS — C9000 Multiple myeloma not having achieved remission: Secondary | ICD-10-CM

## 2015-03-03 DIAGNOSIS — D709 Neutropenia, unspecified: Secondary | ICD-10-CM

## 2015-03-03 DIAGNOSIS — E119 Type 2 diabetes mellitus without complications: Secondary | ICD-10-CM

## 2015-03-03 DIAGNOSIS — R42 Dizziness and giddiness: Secondary | ICD-10-CM

## 2015-03-03 DIAGNOSIS — D696 Thrombocytopenia, unspecified: Secondary | ICD-10-CM

## 2015-03-03 DIAGNOSIS — D469 Myelodysplastic syndrome, unspecified: Secondary | ICD-10-CM

## 2015-03-03 LAB — CBC WITH DIFFERENTIAL/PLATELET
BASO%: 0 % (ref 0.0–2.0)
BASOS ABS: 0 10*3/uL (ref 0.0–0.1)
EOS ABS: 0 10*3/uL (ref 0.0–0.5)
EOS%: 0 % (ref 0.0–7.0)
HCT: 27 % — ABNORMAL LOW (ref 38.4–49.9)
HEMOGLOBIN: 9.4 g/dL — AB (ref 13.0–17.1)
LYMPH%: 80.9 % — AB (ref 14.0–49.0)
MCH: 34.3 pg — AB (ref 27.2–33.4)
MCHC: 34.9 g/dL (ref 32.0–36.0)
MCV: 98.4 fL — AB (ref 79.3–98.0)
MONO#: 0 10*3/uL — AB (ref 0.1–0.9)
MONO%: 2.7 % (ref 0.0–14.0)
NEUT%: 16.4 % — ABNORMAL LOW (ref 39.0–75.0)
NEUTROS ABS: 0.1 10*3/uL — AB (ref 1.5–6.5)
PLATELETS: 31 10*3/uL — AB (ref 140–400)
RBC: 2.74 10*6/uL — AB (ref 4.20–5.82)
RDW: 23.2 % — ABNORMAL HIGH (ref 11.0–14.6)
WBC: 0.7 10*3/uL — AB (ref 4.0–10.3)
lymph#: 0.6 10*3/uL — ABNORMAL LOW (ref 0.9–3.3)

## 2015-03-03 LAB — COMPREHENSIVE METABOLIC PANEL (CC13)
ALBUMIN: 2.9 g/dL — AB (ref 3.5–5.0)
ALK PHOS: 38 U/L — AB (ref 40–150)
ALT: 16 U/L (ref 0–55)
ANION GAP: 8 meq/L (ref 3–11)
AST: 12 U/L (ref 5–34)
BILIRUBIN TOTAL: 0.55 mg/dL (ref 0.20–1.20)
BUN: 32.6 mg/dL — ABNORMAL HIGH (ref 7.0–26.0)
CO2: 19 mEq/L — ABNORMAL LOW (ref 22–29)
Calcium: 8.5 mg/dL (ref 8.4–10.4)
Chloride: 104 mEq/L (ref 98–109)
Creatinine: 1.2 mg/dL (ref 0.7–1.3)
EGFR: 69 mL/min/{1.73_m2} — AB (ref >90)
Glucose: 111 mg/dl (ref 70–140)
POTASSIUM: 4.3 meq/L (ref 3.5–5.1)
Sodium: 131 mEq/L — ABNORMAL LOW (ref 136–145)
TOTAL PROTEIN: 8.1 g/dL (ref 6.4–8.3)

## 2015-03-03 LAB — HOLD TUBE, BLOOD BANK

## 2015-03-03 NOTE — Telephone Encounter (Signed)
per pof to sch pt appt-gave pt copy of avs °

## 2015-03-03 NOTE — Progress Notes (Signed)
[facility] OFFICE PROGRESS NOTE   Diagnosis: Multiple myeloma  INTERVAL HISTORY:   [name] returns as scheduled. He reports feeling much better compared to when we saw him on 02/28/2015. No fever or bleeding. Improved appetite and energy level. No bowel movement for the past several days. He continues to have dizziness when standing.  Objective:  Vital signs in last 24 hours:  Blood pressure 109/66, pulse 77, temperature 97.7 F (36.5 C), temperature source Oral, resp. rate 18, height 6' (1.829 m), weight 224 lb 11.2 oz (101.923 kg), SpO2 100 %.    HEENT: No thrush or ulcers. No bleeding. Resp: Decreased breath sounds at the lower chest, no respiratory distress Cardio: Regular rate and rhythm GI: No hepatosplenomegaly, nontender, no mass Vascular: No leg edema   Lab Results:  Lab Results  Component Value Date   WBC 0.7* 03/03/2015   HGB 9.4* 03/03/2015   HCT 27.0* 03/03/2015   MCV 98.4* 03/03/2015   PLT 31* 03/03/2015   NEUTROABS 0.1* 03/03/2015     Imaging:  Dg Chest 2 View  02/28/2015   CLINICAL DATA:  Shortness of breath, mid left-sided chest pain for the past week, history of multiple myeloma, diabetes, and remote history of smoking  EXAM: CHEST  2 VIEW  COMPARISON:  [facility] lateral chest x-ray of Nov 21, 2014  FINDINGS: The lungs are well-expanded with hemidiaphragm flattening. There is no focal infiltrate. There is minimal stable blunting of the right lateral costophrenic angle. The heart and pulmonary vascularity are normal. The mediastinum is normal in width. The bony thorax exhibits no acute abnormality.  IMPRESSION: There is no CHF or pneumonia. Borderline hyperinflation with hemidiaphragm flattening is chronic and may reflect underlying COPD.   Electronically Signed   By: David  Martinique M.D.   On: 02/28/2015 09:52    Medications: I have reviewed the patient's current medications.  Assessment/Plan: 1. Multiple myeloma, status post treatment with  melphalan/prednisone/thalidomide beginning in November 2009. The thalidomide was increased to 200 mg daily beginning 09/11/2008. The serum M spike was slightly improved on 03/18/2009 and slightly increased on 05/09/2009. He has been maintained off of specific therapy for multiple myeloma since September 2010. The serum M spike was increased on 10/20/2011 at 4.6. He began Revlimid on 11/11/2011 with weekly dexamethasone. He began cycle 2 of Revlimid on 12/09/2011. The serum M spike was improved on 12/31/2011. The serum M spike was slightly higher on 01/28/2012. He began cycle 4 on 02/03/2012. He developed recurrent hypercalcemia. Treatment was changed to Cytoxan/Velcade/Decadron beginning 03/02/2012. Cytoxan discontinued in January 2015  Treatment continued with Velcade/Decadron.   Stable serum M spike and IgG 10/04/2013.   Stable serum M spike 12/06/2013   Treatment placed on hold 01/10/2014 secondary to thrombocytopenia and a slightly higher M spike   Serum M spike and IgG increased 03/07/2014.  Cycle 1 Pomalidomide/dexamethasone 04/01/2014.  Pomalidomide subsequently placed on hold due to cytopenias.  Pomalidomide resumed at a reduced dose 05/23/2014.  Pomalidomide placed on hold 06/04/2014 due to progressive pancytopenia.  Bone marrow biopsy 01/23/2015 showed 60% atypical plasma cells. Myeloid cells in the background with dyspoietic changes associated with increased myeloblastic cells representing up to 11% of all cells.  Daratumumab started 02/13/2015 2. Chronic "dizziness."  3. Chest x-ray 11/01/2008 with a patchy opacity at the right midlung suspicious for pneumonia. He was treated with Avelox. 4. Low back pain with a radicular component. An MRI on 10/07/2008 showed no involvement of the lumbosacral spine with myeloma. 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 5. Bilateral neck pain, status post a CT  05/17/2008 with findings of spinal stenosis and osteoarthritis. 6. 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 Red cell microcytosis, likely related to hemoglobin C trait. A ferritin level returned elevated and a stool Hemoccult was negative 01/23/2010. He underwent a colonoscopy in 2009. 8. Elevated beta-2 microglobulin level. 9. History of hypertension. Blood pressure medication placed on hold December 2015 due to hypotension. 10. History of a herniated disk at L4-L5 on MRI an 01/11/2002. 11. Hyperlipidemia. 12. Gastroesophageal reflux disease. 13. History of atypical angina. 14. History of rectal bleeding-large external hemorrhoids noted 02/25/2012. The stool was Hemoccult positive. He has a history of colon polyps. He is followed by [facility]. He was diagnosed with hemorrhoids in September of 2013 while hospitalized. He underwent a band procedure 03/09/2013. He has had no further rectal bleeding. 15. Superficial venous thrombosis of the greater saphenous vein on a Doppler 09/27/2008. Negative for deep vein thrombosis. Symptoms improved with aspirin. 16. Neck pain with numbness/tingling in the arms, hands, and low back with proximal right leg weakness. An MRI of the cervical spine 05/30/2009 showed multilevel spondylosis with mild cord edema at C4-C5 and C5-C6. He was noted to have an enhancing lesion of the cord at T3-T4 of unclear etiology. In the lumbar spine there was no change in the spondylosis at L3-L4 and L4-L5. There was no involvement of the cervical or lumbar spine with myeloma. He is status post C4-C5, C5-C6, and C6-C7 anterior cervical diskectomy with fusion by Dr. Annette Stable 08/01/2009. 17. Thrombocytopenia-progressive, secondary to myelodysplasia and 5 azacytidine. 18. Indeterminate-age deep vein thrombosis of the left lower extremity on a venous Doppler 08/15/2009. There were also findings consistent with  superficial thrombosis involving the right lower extremity 08/15/2009. Coumadin was discontinued in October 2011. 19. Type 2 diabetes. Now on an oral hypoglycemic 20. History of hematuria, followed at [facility]. Urinalysis was negative for blood on 07/23/2011. 21. Radicular back pain with MRI of the lumbar spine on 10/19/2011 showing nerve root impingement at L2-L3, L3-L4 and L4-L5 with the most severe at the right L3 nerve roots due to a large disc fragment. Associated right leg weakness. He underwent right L2-3 decompressive laminotomy with right L2 and L3 decompressive foraminotomy; right L2-3 microdiscectomy on 11/01/2011. 22. Constipation. He continues a laxative regimen. 23. Hospitalization 11/08/2011 through 11/10/2011 with orthostatic hypotension/dizziness. He improved with intravenous hydration. He had mild hypotension when here on 11/18/2011. We recommended discontinuing Norvasc and Proscar. 24. Hypercalcemia status post pamidronate 11/03/2011. Recurrent hypercalcemia 02/16/2012 status post Zometa. The calcium has remained in normal range. 25. Fractured tooth with associated pain. Status post a tooth extraction by Dr. Enrique Sack, Zometa was placed on hold. 26. Exertional dyspnea. Stable. He was referred to cardiology, pulmonary function studies 06/15/2013 showed a moderately severe diffusion defect. He utilizes supplemental oxygen as needed. He is followed by pulmonary. 27. 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 28. 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   Status post cycle 1 5-azacytidine beginning 07/08/2014 4 days (day 5 held).  Cycle 2 5-azacytidine beginning 08/05/2014 5 days with Neulasta support.  Cycle 3  5-azacytidine beginning 09/02/2014 with Neulasta support.  Cycle 4 5 azacytidine beginning 09/30/2014 with Neulasta support  Cycle 5 5-azacytidine beginning 10/28/2014 with Neulasta support  Cycle 6 5-azacytidine beginning 11/25/2014 with Neulasta support  Cycle 7 5-azacytidine beginning 12/23/2014 with Neulasta support  Treatment placed on hold 01/20/2015 due to progressive pancytopenia. Referred for a bone marrow.  Bone marrow biopsy 01/23/2015 showed 60% atypical plasma cells. Myeloid cells in the background with dyspoietic changes associated with increased myeloblastic cells representing up to 11% of all cells. 29. History of hypotension. Blood pressure medication placed on hold December 2015. 30. Severe neutropenia/thrombocytopenia-most likely secondary to progression of multiple myeloma 31. Diarrhea/dehydration 02/28/2015, status post IV fluids with clinical improvement   Disposition:  Mr. Odette has an improved performance status today. The platelet count remains adequate after the transfusion on 02/28/2015. He will contact us for a fever or bleeding. He continues prophylactic ciprofloxacin. We will follow-up on the IgG level from today. The total protein is lower today, hopefully this is an indicator of a response to the Daratumumab/Decadron.  He will return for treatment on 03/07/2015 and an office visit on 03/14/2015.  Betsy Coder, MD  03/03/2015  11:26 AM

## 2015-03-04 LAB — IGG: IGG (IMMUNOGLOBIN G), SERUM: 2680 mg/dL — AB (ref 650–1600)

## 2015-03-06 ENCOUNTER — Telehealth: Payer: Self-pay | Admitting: *Deleted

## 2015-03-06 NOTE — Telephone Encounter (Signed)
Voicemail from wife reporting "[name] is having problem with swelling on the right side of his gums.  There's a big bump on the back of his gum that needs to be drained.  He takes tylenol but can't keep taking tylenol.  He's for chemotherapy Friday."  9:30 am infusion appointment for Daratumumab.  [id number] called [phone] spoke with wife who says the area is red, slightly swollen and sore.  Had a tooth pulled here several years ago.  I used almond oil that's used for babies mouths but it's still sore/aches.  He's not eating or drinking as much."   Uses [facility] in [place].

## 2015-03-06 NOTE — Telephone Encounter (Signed)
Per Dr. Benay Spice; spoke with pt's wife re: gum pain.  MD instructed pt to call dentist; call for any fever and when pt comes in tomorrow for treatment either Selena Lesser, NP or Dr. Benay Spice will look at it.  Pt's wife verbalized understanding; states "he's not running a fever and we don't have a regular dentist"  Pt verbalized understanding to call if fever/chills and will be evaluated 03/06/15 in treatment area.

## 2015-03-07 ENCOUNTER — Ambulatory Visit (HOSPITAL_BASED_OUTPATIENT_CLINIC_OR_DEPARTMENT_OTHER): Payer: Medicare HMO

## 2015-03-07 ENCOUNTER — Encounter: Payer: Self-pay | Admitting: Nurse Practitioner

## 2015-03-07 ENCOUNTER — Other Ambulatory Visit: Payer: Self-pay | Admitting: *Deleted

## 2015-03-07 ENCOUNTER — Ambulatory Visit (HOSPITAL_BASED_OUTPATIENT_CLINIC_OR_DEPARTMENT_OTHER): Payer: Medicare HMO | Admitting: Nurse Practitioner

## 2015-03-07 ENCOUNTER — Other Ambulatory Visit: Payer: Self-pay | Admitting: Oncology

## 2015-03-07 VITALS — BP 116/59

## 2015-03-07 VITALS — BP 109/56 | HR 85 | Temp 97.9°F | Resp 18

## 2015-03-07 DIAGNOSIS — C9000 Multiple myeloma not having achieved remission: Secondary | ICD-10-CM

## 2015-03-07 DIAGNOSIS — D469 Myelodysplastic syndrome, unspecified: Secondary | ICD-10-CM

## 2015-03-07 DIAGNOSIS — D61810 Antineoplastic chemotherapy induced pancytopenia: Secondary | ICD-10-CM | POA: Diagnosis not present

## 2015-03-07 DIAGNOSIS — K088 Other specified disorders of teeth and supporting structures: Secondary | ICD-10-CM

## 2015-03-07 DIAGNOSIS — Z5112 Encounter for antineoplastic immunotherapy: Secondary | ICD-10-CM

## 2015-03-07 DIAGNOSIS — K0889 Other specified disorders of teeth and supporting structures: Secondary | ICD-10-CM | POA: Insufficient documentation

## 2015-03-07 LAB — CBC WITH DIFFERENTIAL/PLATELET
BASO%: 1.2 % (ref 0.0–2.0)
Basophils Absolute: 0 10*3/uL (ref 0.0–0.1)
EOS%: 0.3 % (ref 0.0–7.0)
Eosinophils Absolute: 0 10*3/uL (ref 0.0–0.5)
HEMATOCRIT: 25.8 % — AB (ref 38.4–49.9)
HGB: 9.1 g/dL — ABNORMAL LOW (ref 13.0–17.1)
LYMPH#: 0.4 10*3/uL — AB (ref 0.9–3.3)
LYMPH%: 70.5 % — ABNORMAL HIGH (ref 14.0–49.0)
MCH: 35 pg — ABNORMAL HIGH (ref 27.2–33.4)
MCHC: 35.3 g/dL (ref 32.0–36.0)
MCV: 99 fL — ABNORMAL HIGH (ref 79.3–98.0)
MONO#: 0.1 10*3/uL (ref 0.1–0.9)
MONO%: 14.9 % — AB (ref 0.0–14.0)
NEUT#: 0.1 10*3/uL — CL (ref 1.5–6.5)
NEUT%: 13.1 % — AB (ref 39.0–75.0)
Platelets: 8 10*3/uL — CL (ref 140–400)
RBC: 2.6 10*6/uL — AB (ref 4.20–5.82)
RDW: 23.3 % — AB (ref 11.0–14.6)
WBC: 0.6 10*3/uL — CL (ref 4.0–10.3)

## 2015-03-07 LAB — COMPREHENSIVE METABOLIC PANEL (CC13)
ALBUMIN: 2.7 g/dL — AB (ref 3.5–5.0)
ALK PHOS: 45 U/L (ref 40–150)
ALT: 12 U/L (ref 0–55)
AST: 14 U/L (ref 5–34)
Anion Gap: 5 mEq/L (ref 3–11)
BUN: 14.4 mg/dL (ref 7.0–26.0)
CHLORIDE: 100 meq/L (ref 98–109)
CO2: 23 mEq/L (ref 22–29)
Calcium: 8.6 mg/dL (ref 8.4–10.4)
Creatinine: 1.1 mg/dL (ref 0.7–1.3)
EGFR: 74 mL/min/{1.73_m2} — ABNORMAL LOW (ref >90)
GLUCOSE: 137 mg/dL (ref 70–140)
POTASSIUM: 4.6 meq/L (ref 3.5–5.1)
SODIUM: 128 meq/L — AB (ref 136–145)
Total Bilirubin: 0.65 mg/dL (ref 0.20–1.20)
Total Protein: 8.2 g/dL (ref 6.4–8.3)

## 2015-03-07 LAB — HOLD TUBE, BLOOD BANK

## 2015-03-07 MED ORDER — SODIUM CHLORIDE 0.9 % IV SOLN
Freq: Once | INTRAVENOUS | Status: AC
  Administered 2015-03-07: 10:00:00 via INTRAVENOUS

## 2015-03-07 MED ORDER — SODIUM CHLORIDE 0.9 % IV SOLN
250.0000 mL | Freq: Once | INTRAVENOUS | Status: AC
  Administered 2015-03-07: 250 mL via INTRAVENOUS

## 2015-03-07 MED ORDER — METHYLPREDNISOLONE SODIUM SUCC 125 MG IJ SOLR
125.0000 mg | Freq: Once | INTRAMUSCULAR | Status: AC
  Administered 2015-03-07: 125 mg via INTRAVENOUS

## 2015-03-07 MED ORDER — DIPHENHYDRAMINE HCL 25 MG PO CAPS
ORAL_CAPSULE | ORAL | Status: AC
  Filled 2015-03-07: qty 2

## 2015-03-07 MED ORDER — TRAMADOL HCL 50 MG PO TABS: 50.0000 mg | ORAL_TABLET | Freq: Three times a day (TID) | ORAL | Status: DC | PRN

## 2015-03-07 MED ORDER — SODIUM CHLORIDE 0.9 % IV SOLN
Freq: Once | INTRAVENOUS | Status: AC
  Administered 2015-03-07: 11:00:00 via INTRAVENOUS
  Filled 2015-03-07: qty 4

## 2015-03-07 MED ORDER — ACETAMINOPHEN 325 MG PO TABS
ORAL_TABLET | ORAL | Status: AC
  Filled 2015-03-07: qty 2

## 2015-03-07 MED ORDER — ACETAMINOPHEN 325 MG PO TABS
650.0000 mg | ORAL_TABLET | Freq: Once | ORAL | Status: AC
  Administered 2015-03-07: 650 mg via ORAL

## 2015-03-07 MED ORDER — SODIUM CHLORIDE 0.9 % IV SOLN
15.2000 mg/kg | Freq: Once | INTRAVENOUS | Status: AC
  Administered 2015-03-07: 1600 mg via INTRAVENOUS
  Filled 2015-03-07: qty 80

## 2015-03-07 MED ORDER — DIPHENHYDRAMINE HCL 25 MG PO CAPS
50.0000 mg | ORAL_CAPSULE | Freq: Once | ORAL | Status: AC
  Administered 2015-03-07: 50 mg via ORAL

## 2015-03-07 MED ORDER — METHYLPREDNISOLONE SODIUM SUCC 125 MG IJ SOLR
INTRAMUSCULAR | Status: AC
  Filled 2015-03-07: qty 2

## 2015-03-07 NOTE — Progress Notes (Signed)
SYMPTOM MANAGEMENT CLINIC   HPI: [name] 79 y.o. male diagnosed with multiple myeloma.  Currently undergoing Daratumumab  chemotherapy.  Patient is complaining of pain to his left lower back molar for the past several days.  He reports that he is only able to eat soft foods; due to the pain.  He denies any recent fevers or chills.  HPI  ROS  Past Medical History  Diagnosis Date  . GERD (gastroesophageal reflux disease)   . Essential hypertension, benign   . BPH (benign prostatic hyperplasia)   . Lumbar herniated disc     L4-5  . Hyperlipidemia   . Sleep apnea   . Multiple myeloma 02/19/2008  . Arthritis   . Diverticulosis   . Peptic ulcer disease   . Tubular adenoma 02/16/2008    Dr. Silvano Rusk  . MDS (myelodysplastic syndrome) 06/17/2014  . Diabetes mellitus without complication     Past Surgical History  Procedure Laterality Date  . Neck surgery    . Lumbar laminectomy/decompression microdiscectomy  11/01/2011    Procedure: LUMBAR LAMINECTOMY/DECOMPRESSION MICRODISCECTOMY 2 LEVELS;  Surgeon: Charlie Pitter, MD;  Location: [facility];  Service: Neurosurgery;  Laterality: Right;  Lumbar Laminectomy/Microdiscectomy Decompression Lumbar Three-Four, Lumbar Four-Five   . Colonoscopy    . Hemorrhoid banding  2014  . Esophagogastroduodenoscopy      has Obstructive sleep apnea; Insomnia with sleep apnea; Multiple myeloma; Lumbar disc herniation with radiculopathy; Renal insufficiency; Constipation; Personal history of colonic adenoma; Essential hypertension, benign; Mixed hyperlipidemia; Other malaise and fatigue; Hemorrhoids, internal, with bleeding and grade 2 prolapse; Seasonal allergic rhinitis; Recurrent isolated sleep paralysis; Dyspnea on exertion; Chest congestion; Fever, unspecified; Low oxygen saturation; Asthma with COPD; Hypoxia; SOB (shortness of breath); Pneumonia; Diastolic CHF; GERD (gastroesophageal reflux disease); Fatigue; Chest pain; Shoulder pain;  Hypoalbuminemia due to protein-calorie malnutrition; Peripheral edema; Pancytopenia due to chemotherapy; MDS (myelodysplastic syndrome); Anal skin tags; Diabetes mellitus without complication; and Pain, dental on his problem list.    is allergic to penicillins.    Medication List       This list is accurate as of: 03/07/15  5:38 PM.  Always use your most recent med list.               acetaminophen 500 MG tablet  Commonly known as:  TYLENOL  Take 500 mg by mouth every 6 (six) hours as needed for moderate pain.     acyclovir 400 MG tablet  Commonly known as:  ZOVIRAX  TAKE 1 TABLET (400 MG TOTAL) BY MOUTH 2 (TWO) TIMES DAILY.     albuterol 108 (90 BASE) MCG/ACT inhaler  Commonly known as:  PROVENTIL HFA;VENTOLIN HFA  Inhale 2 puffs into the lungs every 6 (six) hours as needed for wheezing or shortness of breath.     atorvastatin 20 MG tablet  Commonly known as:  LIPITOR  TAKE 1 TABLET (20 MG TOTAL) BY MOUTH DAILY.     canagliflozin 100 MG Tabs tablet  Commonly known as:  INVOKANA  Take 1 tablet (100 mg total) by mouth daily.     ciprofloxacin 500 MG tablet  Commonly known as:  CIPRO  Take 1 tablet (500 mg total) by mouth 2 (two) times daily.     clonazePAM 0.5 MG tablet  Commonly known as:  KLONOPIN  TAKE ONE TO TWO TABLETS BY MOUTH 30 MINUTES BEFORE BEDTIME AS NEEDED     dexamethasone 4 MG tablet  Commonly known as:  DECADRON  Take 1 tablet (  4 mg total) by mouth 2 (two) times daily with a meal. For 2 days. Begin day after each chemo treatment.     dexamethasone 4 MG tablet  Commonly known as:  DECADRON  Take 20 mg (5 tablets) once a week.     diclofenac sodium 1 % Gel  Commonly known as:  VOLTAREN  Apply 2 g topically 4 (four) times daily.     glucose blood test strip  Commonly known as:  ONETOUCH VERIO  1 each by Other route as needed for other. Use as instructed     KLOR-CON M20 20 MEQ tablet  Generic drug:  potassium chloride SA  TAKE ONE TABLET BY MOUTH  ONCE DAILY     lactulose 10 GM/15ML solution  Commonly known as:  CHRONULAC  Take 15 mLs (10 g total) by mouth 2 (two) times daily as needed.     loratadine 10 MG tablet  Commonly known as:  CLARITIN  TAKE 1 TABLET (10 MG TOTAL) BY MOUTH DAILY.     metFORMIN 500 MG tablet  Commonly known as:  GLUCOPHAGE  TAKE 1 TABLET (500 MG TOTAL) BY MOUTH 2 (TWO) TIMES DAILY WITH A MEAL.     montelukast 10 MG tablet  Commonly known as:  SINGULAIR  Take 1 tablet (10 mg total) by mouth at bedtime. Day before and day of each chemo treatment.     ONETOUCH DELICA LANCETS FINE Misc  1 each by Does not apply route daily.     pantoprazole 40 MG tablet  Commonly known as:  PROTONIX  Take 1 tablet (40 mg total) by mouth 2 (two) times daily.     polyethylene glycol powder powder  Commonly known as:  GLYCOLAX/MIRALAX  TAKE 1-2 DOSES DAILY AS NEEDED     prochlorperazine 10 MG tablet  Commonly known as:  COMPAZINE  TAKE 1 TABLET BY MOUTH EVERY SIX HOURS AS NEEDED     tamsulosin 0.4 MG Caps capsule  Commonly known as:  FLOMAX  TAKE 1 CAPSULE (0.4 MG TOTAL) BY MOUTH 2 (TWO) TIMES DAILY. WITH A MEAL.     traMADol 50 MG tablet  Commonly known as:  ULTRAM  Take 1 tablet (50 mg total) by mouth every 8 (eight) hours as needed.         PHYSICAL EXAMINATION  Oncology Vitals 03/07/2015 03/07/2015 03/07/2015 03/07/2015 03/07/2015 03/07/2015 03/03/2015  Height - - - - - - 183 cm  Weight - - - - - - 101.923 kg  Weight (lbs) - - - - - - 224 lbs 11 oz  BMI (kg/m2) - - - - - - 30.47 kg/m2  Temp 97.9 98.1 98 97.6 97.7 98.2 97.7  Pulse 85 84 88 88 78 96 77  Resp _0 Resp (Historical as of 02/10/12) - - - - - - -  SpO2 98 100 100 100 100 100 100  BSA (m2) - - - - - - 2.28 m2   BP Readings from Last 3 Encounters:  03/07/15 116/59  03/07/15 109/56  03/03/15 109/66    Physical Exam  Constitutional: He is oriented to person, place, and time and well-developed, well-nourished, and in no  distress.  HENT:  Head: Normocephalic and atraumatic.  Mouth/Throat: Oropharynx is clear and moist.  On exam it does appear that patient's left lower back molar is black with questionable decay at the base.  Entire area surrounding the tooth is very tender.  There is no obvious,  edema, or bleeding.        Eyes: Conjunctivae and EOM are normal. Pupils are equal, round, and reactive to light. Right eye exhibits no discharge. Left eye exhibits no discharge. No scleral icterus.  Neck: Normal range of motion. Neck supple.  Pulmonary/Chest: Effort normal. No respiratory distress.  Musculoskeletal: Normal range of motion.  Neurological: He is alert and oriented to person, place, and time. Gait normal.  Skin: Skin is warm and dry.  Psychiatric: Affect normal.  Nursing note and vitals reviewed.   LABORATORY DATA:. Infusion on 03/07/2015  Component Date Value Ref Range Status  . Unit Number 03/07/2015 [id number]   Final  . Blood Component Type 03/07/2015 PLTPHER LR2   Final  . Unit division 03/07/2015 00   Final  . Status of Unit 03/07/2015 ISSUED   Final  . Transfusion Status 03/07/2015 OK TO TRANSFUSE   Final  Appointment on 03/07/2015  Component Date Value Ref Range Status  . WBC 03/07/2015 0.6* 4.0 - 10.3 10e3/uL Final  . NEUT# 03/07/2015 0.1* 1.5 - 6.5 10e3/uL Final  . HGB 03/07/2015 9.1* 13.0 - 17.1 g/dL Final  . HCT 03/07/2015 25.8* 38.4 - 49.9 % Final  . Platelets 03/07/2015 8* 140 - 400 10e3/uL Final  . MCV 03/07/2015 99.0* 79.3 - 98.0 fL Final  . MCH 03/07/2015 35.0* 27.2 - 33.4 pg Final  . MCHC 03/07/2015 35.3  32.0 - 36.0 g/dL Final  . RBC 03/07/2015 2.60* 4.20 - 5.82 10e6/uL Final  . RDW 03/07/2015 23.3* 11.0 - 14.6 % Final  . lymph# 03/07/2015 0.4* 0.9 - 3.3 10e3/uL Final  . MONO# 03/07/2015 0.1  0.1 - 0.9 10e3/uL Final  . Eosinophils Absolute 03/07/2015 0.0  0.0 - 0.5 10e3/uL Final  . Basophils Absolute 03/07/2015 0.0  0.0 - 0.1 10e3/uL Final  . NEUT% 03/07/2015  13.1* 39.0 - 75.0 % Final  . LYMPH% 03/07/2015 70.5* 14.0 - 49.0 % Final  . MONO% 03/07/2015 14.9* 0.0 - 14.0 % Final  . EOS% 03/07/2015 0.3  0.0 - 7.0 % Final  . BASO% 03/07/2015 1.2  0.0 - 2.0 % Final  . Hold Tube, Blood Bank 03/07/2015 Blood Bank Order Cancelled   Final  . Sodium 03/07/2015 128* 136 - 145 mEq/L Final  . Potassium 03/07/2015 4.6  3.5 - 5.1 mEq/L Final  . Chloride 03/07/2015 100  98 - 109 mEq/L Final  . CO2 03/07/2015 23  22 - 29 mEq/L Final  . Glucose 03/07/2015 137  70 - 140 mg/dl Final  . BUN 03/07/2015 14.4  7.0 - 26.0 mg/dL Final  . Creatinine 03/07/2015 1.1  0.7 - 1.3 mg/dL Final  . Total Bilirubin 03/07/2015 0.65  0.20 - 1.20 mg/dL Final  . Alkaline Phosphatase 03/07/2015 45  40 - 150 U/L Final  . AST 03/07/2015 14  5 - 34 U/L Final  . ALT 03/07/2015 12  0 - 55 U/L Final  . Total Protein 03/07/2015 8.2  6.4 - 8.3 g/dL Final  . Albumin 03/07/2015 2.7* 3.5 - 5.0 g/dL Final  . Calcium 03/07/2015 8.6  8.4 - 10.4 mg/dL Final  . Anion Gap 03/07/2015 5  3 - 11 mEq/L Final  . EGFR 03/07/2015 74* >90 ml/min/1.73 m2 Final   eGFR is calculated using the CKD-EPI Creatinine Equation (2009)     RADIOGRAPHIC STUDIES: No results found.  ASSESSMENT/PLAN:    Multiple myeloma Patient presents to the [facility] today to receive his 3rd cycle of daratumumab chemotherapy.  Patient states that he  is feeling some better within this past week.  He denies any nausea, vomiting, diarrhea, or constipation.  He also denies any recent fevers or chills.  He is complaining of some left lower molar dental pain for the past few days.  Blood counts obtained today revealed a WBC of 0.6, ANC 0.1, hemoglobin 9.1, platelet count 8.  Patient denies any worsening issues with either easy bleeding or bruising.  Patient will receive 1 unit platelets today for thrombocytopenia.  Patient will proceed today with cycle 3 of his chemotherapy as planned.  Patient has plans to return to the [facility] this coming Monday, 03/10/2015 for repeat CBC.  He is scheduled to meet with oral surgeon, Dr. Frederik Schmidt later that same morning for initial evaluation and management of dental pain issues.  Patient is scheduled for Port-A-Cath placement on 03/11/2015.  Patient is scheduled for labs, visit, his next cycle of chemotherapy on 03/14/2015.  Pain, dental Patient is complaining of pain to his left lower back molar for the past several days.  He reports that he is only able to eat soft foods; due to the pain.  He denies any recent fevers or chills.  On exam it does appear that patient's left lower back molar is black with questionable decay at the base.  Entire area surrounding the tooth is very tender.  There is no obvious, edema, or bleeding.  Was able to obtain an oral surgeon evaluation with Dr. Baltazar Najjar for this coming Monday, 03/10/2015 at 9:15 AM.  Patient was instructed to return to the [facility] for a repeat CBC/differential to confirm blood counts prior to his dentist/oral surgeon appointment.  Patient was also advised to continue with his prophylactic Cipro antibiotics as directed.  Patient was also given a refill of his Ultram pain medication.  Patient was advised to go directly to the emergency department over the weekend if he develops any worsening symptoms whatsoever.    Pancytopenia due to chemotherapy Patient presents to the [facility] today to receive his 3rd cycle of daratumumab chemotherapy.  Patient states that he is feeling some better within this past week.  He denies any nausea, vomiting, diarrhea, or constipation.  He also denies any recent fevers or chills.  He is complaining of some left lower molar dental pain for the past few days.  Blood counts obtained today revealed a WBC of 0.6, ANC 0.1, hemoglobin 9.1, platelet count 8.  Patient denies any worsening issues with either easy bleeding or bruising.  Patient will receive 1 unit platelets today  for thrombocytopenia.  Patient will proceed today with cycle 3 of his chemotherapy as planned.  Patient has plans to return to the [facility] this coming Monday, 03/10/2015 for repeat CBC.  He is scheduled to meet with oral surgeon, Dr. Frederik Schmidt later that same morning for initial evaluation and management of dental pain issues.  Patient may very well need additional platelet transfusion if platelets remain low prior to any dental/oral surgery.  Patient is scheduled for Port-A-Cath placement on 03/11/2015.  Patient is scheduled for labs, visit, his next cycle of chemotherapy on 03/14/2015.  Patient stated understanding of all instructions; and was in agreement with this plan of care. The patient knows to call the clinic with any problems, questions or concerns.   This was a shared visit with Dr. Benay Spice today.  Total time spent with patient was 40 minutes;  with greater than 75 percent of that time spent in face to face  counseling regarding patient's symptoms,  and coordination of care and follow up.  Disclaimer:This dictation was prepared with Dragon/digital dictation along with [employer]. Any transcriptional errors that result from this process are unintentional.  Drue Second, NP 03/07/2015   This was a shared visit with Drue Second.   Mr. Studstill was interviewed and examined.  He appears to have a dental infection. He will continue antibiotics and knows to contact us for a fever. He is scheduled for an evaluation by oral surgery 8/29.  Julieanne Manson, MD

## 2015-03-07 NOTE — Assessment & Plan Note (Signed)
Patient presents to the [facility] today to receive his 3rd cycle of daratumumab chemotherapy.  Patient states that he is feeling some better within this past week.  He denies any nausea, vomiting, diarrhea, or constipation.  He also denies any recent fevers or chills.  He is complaining of some left lower molar dental pain for the past few days.  Blood counts obtained today revealed a WBC of 0.6, ANC 0.1, hemoglobin 9.1, platelet count 8.  Patient denies any worsening issues with either easy bleeding or bruising.  Patient will receive 1 unit platelets today for thrombocytopenia.  Patient will proceed today with cycle 3 of his chemotherapy as planned.  Patient has plans to return to the [facility] this coming Monday, 03/10/2015 for repeat CBC.  He is scheduled to meet with oral surgeon, Dr. Frederik Schmidt later that same morning for initial evaluation and management of dental pain issues.  Patient may very well need additional platelet transfusion if platelets remain low prior to any dental/oral surgery.  Patient is scheduled for Port-A-Cath placement on 03/11/2015.  Patient is scheduled for labs, visit, his next cycle of chemotherapy on 03/14/2015.

## 2015-03-07 NOTE — Assessment & Plan Note (Signed)
Patient is complaining of pain to his left lower back molar for the past several days.  He reports that he is only able to eat soft foods; due to the pain.  He denies any recent fevers or chills.  On exam it does appear that patient's left lower back molar is black with questionable decay at the base.  Entire area surrounding the tooth is very tender.  There is no obvious, edema, or bleeding.  Was able to obtain an oral surgeon evaluation with Dr. Baltazar Najjar for this coming Monday, 03/10/2015 at 9:15 AM.  Patient was instructed to return to the [facility] for a repeat CBC/differential to confirm blood counts prior to his dentist/oral surgeon appointment.  Patient was also advised to continue with his prophylactic Cipro antibiotics as directed.  Patient was also given a refill of his Ultram pain medication.  Patient was advised to go directly to the emergency department over the weekend if he develops any worsening symptoms whatsoever.

## 2015-03-07 NOTE — Progress Notes (Signed)
Per Dr. Benay Spice, okay to treat with today's labs.  Pt to receive 1 unit of plts today while in infusion room.

## 2015-03-07 NOTE — Assessment & Plan Note (Signed)
Patient presents to the [facility] today to receive his 3rd cycle of daratumumab chemotherapy.  Patient states that he is feeling some better within this past week.  He denies any nausea, vomiting, diarrhea, or constipation.  He also denies any recent fevers or chills.  He is complaining of some left lower molar dental pain for the past few days.  Blood counts obtained today revealed a WBC of 0.6, ANC 0.1, hemoglobin 9.1, platelet count 8.  Patient denies any worsening issues with either easy bleeding or bruising.  Patient will receive 1 unit platelets today for thrombocytopenia.  Patient will proceed today with cycle 3 of his chemotherapy as planned.  Patient has plans to return to the [facility] this coming Monday, 03/10/2015 for repeat CBC.  He is scheduled to meet with oral surgeon, Dr. Frederik Schmidt later that same morning for initial evaluation and management of dental pain issues.  Patient is scheduled for Port-A-Cath placement on 03/11/2015.  Patient is scheduled for labs, visit, his next cycle of chemotherapy on 03/14/2015.

## 2015-03-07 NOTE — Patient Instructions (Signed)
[facility] Discharge Instructions for Patients Receiving Chemotherapy  Today you received the following chemotherapy agents Darzalex.  To help prevent nausea and vomiting after your treatment, we encourage you to take your nausea medication as directed.   If you develop nausea and vomiting that is not controlled by your nausea medication, call the clinic.   BELOW ARE SYMPTOMS THAT SHOULD BE REPORTED IMMEDIATELY:  *FEVER GREATER THAN 100.5 F  *CHILLS WITH OR WITHOUT FEVER  NAUSEA AND VOMITING THAT IS NOT CONTROLLED WITH YOUR NAUSEA MEDICATION  *UNUSUAL SHORTNESS OF BREATH  *UNUSUAL BRUISING OR BLEEDING  TENDERNESS IN MOUTH AND THROAT WITH OR WITHOUT PRESENCE OF ULCERS  *URINARY PROBLEMS  *BOWEL PROBLEMS  UNUSUAL RASH Items with * indicate a potential emergency and should be followed up as soon as possible.  Feel free to call the clinic you have any questions or concerns. The clinic phone number is [phone].  Please show the [facility] at check-in to the Emergency Department and triage nurse.  Platelet Transfusion Information This is information about transfusions of platelets. Platelets are tiny cells made by the bone marrow and found in the blood. When a blood vessel is damaged, platelets rush to the damaged area to help form a clot. This begins the healing process. When platelets get very low, your blood may have trouble clotting. This may be from:  Illness.  Blood disorder.  Chemotherapy to treat cancer. Often, lower platelet counts do not cause problems.  Platelets usually last for 7 to 10 days. If they are not used in an injury, they are broken down by the liver or spleen. Symptoms of low platelet count include:  Nosebleeds.  Bleeding gums.  Heavy periods.  Bruising and tiny blood spots in the skin.  Pinpoint spots of bleeding (petechiae).  Larger bruises (purpura).  Bleeding can be more serious if it happens in the brain or  bowel. Platelet transfusions are often used to keep the platelet count at an acceptable level. Serious bleeding due to low platelets is uncommon. RISKS AND COMPLICATIONS Severe side effects from platelet transfusions are uncommon. Minor reactions may include:  Itching.  Rashes.  High temperature and shivering. Medications are available to stop transfusion reactions. Let your health care provider know if you develop any of the above problems.  If you are having platelet transfusions frequently, they may get less effective. This is called becoming refractory to platelets. It is uncommon. This can happen from non-immune causes and immune causes. Non-immune causes include:  High temperatures.  Some medications.  An enlarged spleen. Immune causes happen when your body discovers the platelets are not your own and begins making antibodies against them. The antibodies kill the platelets quickly. Even with platelet transfusions, you may still notice problems with bleeding or bruising. Let your health care providers know about this. Other things can be done to help if this happens.  BEFORE THE PROCEDURE   Your health care provider will check your platelet count regularly.  If the platelet count is too low, it may be necessary to have a platelet transfusion.  This is more important before certain procedures with a risk of bleeding, such as a spinal tap.  Platelet transfusion reduces the risk of bleeding during or after the procedure.  Except in emergencies, giving a transfusion requires a written consent. Before blood is taken from a donor, a complete history is taken to make sure the person has no history of previous diseases, nor engages in risky social  behavior. Examples of this are intravenous drug use or sexual activity with multiple partners. This could lead to infected blood or blood products being used. This history is taken in spite of the extensive testing to make sure the blood is safe.  All blood products transfused are tested to make sure it is a match for the person getting the blood. It is also checked for infections. Blood is the safest it has ever been. The risk of getting an infection is very low. PROCEDURE  The platelets are stored in small plastic bags that are kept at a low temperature.  Each bag is called a unit and sometimes two units are given. They are given through an intravenous line by drip infusion over about one-half hour.  Usually blood is collected from multiple people to get enough to transfuse.  Sometimes, the platelets are collected from a single person. This is done using a special machine that separates the platelets from the blood. The machine is called an apheresis machine. Platelets collected in this way are called apheresed platelets. Apheresed platelets reduce the risk of becoming sensitive to the platelets. This lowers the chances of having a transfusion reaction.  As it only takes a short time to give the platelets, this treatment can be given in an outpatient department. Platelets can also be given before or after other treatments. SEEK IMMEDIATE MEDICAL CARE IF: You have any of the following symptoms over the next 12 hours or several days:  Shaking chills.  Fever with a temperature greater than 102F (38.9C) develops.  Back pain or muscle pain.  People around you feel you are not acting correctly, or you are confused.  Blood in the urine or bowel movements, or bleeding from any place in your body.  Shortness of breath, or difficulty breathing.  Dizziness.  Fainting.  You break out in a rash or develop hives.  Decrease in the amount of urine you are putting out, or the urine turns a dark color or changes to pink, red, or brown.  A severe headache or stiff neck.  Bruising more easily. Document Released: 04/25/2007 Document Revised: 11/12/2013 Document Reviewed: 04/25/2007 [facility] Patient Information 2015 [place], [place]. This  information is not intended to replace advice given to you by your health care provider. Make sure you discuss any questions you have with your health care provider.

## 2015-03-09 ENCOUNTER — Other Ambulatory Visit: Payer: Self-pay | Admitting: Oncology

## 2015-03-09 LAB — PREPARE PLATELET PHERESIS: Unit division: 0

## 2015-03-10 ENCOUNTER — Ambulatory Visit: Payer: Medicare HMO

## 2015-03-10 ENCOUNTER — Other Ambulatory Visit: Payer: Self-pay | Admitting: *Deleted

## 2015-03-10 ENCOUNTER — Other Ambulatory Visit: Payer: Self-pay | Admitting: Physician Assistant

## 2015-03-10 ENCOUNTER — Other Ambulatory Visit (HOSPITAL_COMMUNITY): Payer: Self-pay

## 2015-03-10 ENCOUNTER — Telehealth: Payer: Self-pay | Admitting: *Deleted

## 2015-03-10 ENCOUNTER — Ambulatory Visit (HOSPITAL_COMMUNITY)
Admission: RE | Admit: 2015-03-10 | Discharge: 2015-03-10 | Disposition: A | Discharge status: Home / Self Care | Payer: Medicare HMO | Source: Ambulatory Visit | Attending: Family Medicine | Admitting: Family Medicine

## 2015-03-10 ENCOUNTER — Other Ambulatory Visit (HOSPITAL_BASED_OUTPATIENT_CLINIC_OR_DEPARTMENT_OTHER): Payer: Medicare HMO

## 2015-03-10 ENCOUNTER — Other Ambulatory Visit: Payer: Self-pay | Admitting: Radiology

## 2015-03-10 DIAGNOSIS — C9000 Multiple myeloma not having achieved remission: Secondary | ICD-10-CM

## 2015-03-10 LAB — CBC WITH DIFFERENTIAL/PLATELET
BASO%: 0 % (ref 0.0–2.0)
Basophils Absolute: 0 10*3/uL (ref 0.0–0.1)
EOS%: 0 % (ref 0.0–7.0)
Eosinophils Absolute: 0 10*3/uL (ref 0.0–0.5)
HEMATOCRIT: 21.7 % — AB (ref 38.4–49.9)
HGB: 7.9 g/dL — ABNORMAL LOW (ref 13.0–17.1)
LYMPH#: 1.1 10*3/uL (ref 0.9–3.3)
LYMPH%: 85.9 % — AB (ref 14.0–49.0)
MCH: 33.8 pg — AB (ref 27.2–33.4)
MCHC: 36.4 g/dL — AB (ref 32.0–36.0)
MCV: 92.7 fL (ref 79.3–98.0)
MONO#: 0.1 10*3/uL (ref 0.1–0.9)
MONO%: 7.8 % (ref 0.0–14.0)
NEUT%: 6.3 % — ABNORMAL LOW (ref 39.0–75.0)
NEUTROS ABS: 0.1 10*3/uL — AB (ref 1.5–6.5)
Platelets: 16 10*3/uL — ABNORMAL LOW (ref 140–400)
RBC: 2.34 10*6/uL — ABNORMAL LOW (ref 4.20–5.82)
RDW: 21.8 % — ABNORMAL HIGH (ref 11.0–14.6)
WBC: 1.3 10*3/uL — AB (ref 4.0–10.3)
nRBC: 0 % (ref 0–0)

## 2015-03-10 LAB — PREPARE RBC (CROSSMATCH)

## 2015-03-10 LAB — TECHNOLOGIST REVIEW

## 2015-03-10 LAB — HOLD TUBE, BLOOD BANK

## 2015-03-10 NOTE — Telephone Encounter (Signed)
Pending tooth extraction to be scheduled for this patient.

## 2015-03-10 NOTE — Progress Notes (Signed)
Patient found to have antibodies in his blood which is requiring additional blood sample to be sent out. Notified Dr. Michel Bickers nurse. I also told her that the patient was a very difficult stick and we would be unable to obtain the blood draws. I was then instructed to send patient back over to the cancer center and they would obtain the labs.

## 2015-03-10 NOTE — Addendum Note (Signed)
Addended by: Adalberto Cole on: 03/10/2015 04:49 PM   Modules accepted: Orders

## 2015-03-10 NOTE — Telephone Encounter (Signed)
Spoke with Lavella Lemons from Dr. Chilton Si office. Pt has not been scheduled for tooth extraction. Dr. Westley Chandler would like to discuss case with Dr. Benay Spice prior to procedure. Dr. Gearldine Shown pager # provided.  Discussed with Dr. Benay Spice: Pt will need platelet transfusion prior to port placement on 8/30. Pt worked in at [facility] 8/30 at 1000 for platelets. Pt did not receive blood transfusion due to difficulty with crossmatch. Pt and wife came back to [facility] lab for phlebotomy. Additional sample sent to blood bank. Beth estimates crossmatch may be back tomorrow morning. Will likely need to transfuse least incompatible. Dr. Benay Spice made aware. Order received for pt to begin daily Neupogen on 8/30 as well.  Called pt's wife, informed her of 10AM infusion appointment. She voiced understanding. Will alert [facility] staff of Neupogen order.

## 2015-03-11 ENCOUNTER — Ambulatory Visit (HOSPITAL_BASED_OUTPATIENT_CLINIC_OR_DEPARTMENT_OTHER): Payer: Medicare HMO

## 2015-03-11 ENCOUNTER — Ambulatory Visit (HOSPITAL_BASED_OUTPATIENT_CLINIC_OR_DEPARTMENT_OTHER): Payer: Self-pay | Admitting: Nurse Practitioner

## 2015-03-11 ENCOUNTER — Other Ambulatory Visit (HOSPITAL_BASED_OUTPATIENT_CLINIC_OR_DEPARTMENT_OTHER): Payer: Medicare HMO | Admitting: *Deleted

## 2015-03-11 ENCOUNTER — Encounter: Payer: Self-pay | Admitting: Nurse Practitioner

## 2015-03-11 ENCOUNTER — Inpatient Hospital Stay (HOSPITAL_COMMUNITY)
Admission: EM | Admit: 2015-03-11 | Discharge: 2015-03-21 | DRG: 853 | Disposition: A | Discharge status: Hospice | Payer: Medicare HMO | Attending: Internal Medicine | Admitting: Internal Medicine

## 2015-03-11 ENCOUNTER — Encounter (HOSPITAL_COMMUNITY): Payer: Self-pay

## 2015-03-11 ENCOUNTER — Ambulatory Visit (HOSPITAL_COMMUNITY)
Admission: RE | Admit: 2015-03-11 | Discharge: 2015-03-11 | Disposition: A | Discharge status: Home / Self Care | Payer: Medicare HMO | Source: Ambulatory Visit | Attending: Oncology | Admitting: Oncology

## 2015-03-11 ENCOUNTER — Ambulatory Visit (HOSPITAL_COMMUNITY): Admission: RE | Admit: 2015-03-11 | Payer: Medicare HMO | Source: Ambulatory Visit

## 2015-03-11 ENCOUNTER — Ambulatory Visit (HOSPITAL_COMMUNITY): Admission: RE | Admit: 2015-03-11 | Payer: Self-pay | Source: Ambulatory Visit

## 2015-03-11 ENCOUNTER — Emergency Department (HOSPITAL_COMMUNITY): Payer: Medicare HMO

## 2015-03-11 ENCOUNTER — Other Ambulatory Visit: Payer: Self-pay | Admitting: *Deleted

## 2015-03-11 VITALS — BP 102/54 | HR 110 | Temp 100.3°F | Resp 20

## 2015-03-11 VITALS — BP 110/49 | HR 116 | Temp 100.2°F | Resp 16 | Ht 72.0 in

## 2015-03-11 DIAGNOSIS — J984 Other disorders of lung: Secondary | ICD-10-CM | POA: Diagnosis not present

## 2015-03-11 DIAGNOSIS — Z880 Allergy status to penicillin: Secondary | ICD-10-CM

## 2015-03-11 DIAGNOSIS — G4730 Sleep apnea, unspecified: Secondary | ICD-10-CM | POA: Diagnosis present

## 2015-03-11 DIAGNOSIS — N400 Enlarged prostate without lower urinary tract symptoms: Secondary | ICD-10-CM | POA: Diagnosis present

## 2015-03-11 DIAGNOSIS — M264 Malocclusion, unspecified: Secondary | ICD-10-CM | POA: Diagnosis present

## 2015-03-11 DIAGNOSIS — Z66 Do not resuscitate: Secondary | ICD-10-CM | POA: Diagnosis present

## 2015-03-11 DIAGNOSIS — Z79891 Long term (current) use of opiate analgesic: Secondary | ICD-10-CM

## 2015-03-11 DIAGNOSIS — I82401 Acute embolism and thrombosis of unspecified deep veins of right lower extremity: Secondary | ICD-10-CM | POA: Diagnosis present

## 2015-03-11 DIAGNOSIS — K045 Chronic apical periodontitis: Secondary | ICD-10-CM | POA: Diagnosis not present

## 2015-03-11 DIAGNOSIS — I82409 Acute embolism and thrombosis of unspecified deep veins of unspecified lower extremity: Secondary | ICD-10-CM

## 2015-03-11 DIAGNOSIS — Z86718 Personal history of other venous thrombosis and embolism: Secondary | ICD-10-CM | POA: Diagnosis not present

## 2015-03-11 DIAGNOSIS — R7989 Other specified abnormal findings of blood chemistry: Secondary | ICD-10-CM

## 2015-03-11 DIAGNOSIS — K029 Dental caries, unspecified: Secondary | ICD-10-CM | POA: Diagnosis present

## 2015-03-11 DIAGNOSIS — R509 Fever, unspecified: Secondary | ICD-10-CM | POA: Diagnosis not present

## 2015-03-11 DIAGNOSIS — R52 Pain, unspecified: Secondary | ICD-10-CM | POA: Diagnosis present

## 2015-03-11 DIAGNOSIS — Z515 Encounter for palliative care: Secondary | ICD-10-CM | POA: Diagnosis not present

## 2015-03-11 DIAGNOSIS — R5081 Fever presenting with conditions classified elsewhere: Secondary | ICD-10-CM

## 2015-03-11 DIAGNOSIS — R609 Edema, unspecified: Secondary | ICD-10-CM

## 2015-03-11 DIAGNOSIS — D61810 Antineoplastic chemotherapy induced pancytopenia: Secondary | ICD-10-CM | POA: Diagnosis present

## 2015-03-11 DIAGNOSIS — K047 Periapical abscess without sinus: Secondary | ICD-10-CM | POA: Diagnosis present

## 2015-03-11 DIAGNOSIS — D709 Neutropenia, unspecified: Secondary | ICD-10-CM

## 2015-03-11 DIAGNOSIS — M7989 Other specified soft tissue disorders: Secondary | ICD-10-CM | POA: Diagnosis not present

## 2015-03-11 DIAGNOSIS — K219 Gastro-esophageal reflux disease without esophagitis: Secondary | ICD-10-CM | POA: Diagnosis present

## 2015-03-11 DIAGNOSIS — D469 Myelodysplastic syndrome, unspecified: Secondary | ICD-10-CM | POA: Diagnosis present

## 2015-03-11 DIAGNOSIS — I10 Essential (primary) hypertension: Secondary | ICD-10-CM | POA: Diagnosis present

## 2015-03-11 DIAGNOSIS — Y95 Nosocomial condition: Secondary | ICD-10-CM | POA: Diagnosis present

## 2015-03-11 DIAGNOSIS — D6859 Other primary thrombophilia: Secondary | ICD-10-CM | POA: Diagnosis present

## 2015-03-11 DIAGNOSIS — K0889 Other specified disorders of teeth and supporting structures: Secondary | ICD-10-CM

## 2015-03-11 DIAGNOSIS — J189 Pneumonia, unspecified organism: Secondary | ICD-10-CM | POA: Diagnosis present

## 2015-03-11 DIAGNOSIS — C9000 Multiple myeloma not having achieved remission: Secondary | ICD-10-CM

## 2015-03-11 DIAGNOSIS — R0603 Acute respiratory distress: Secondary | ICD-10-CM

## 2015-03-11 DIAGNOSIS — Z7952 Long term (current) use of systemic steroids: Secondary | ICD-10-CM | POA: Diagnosis not present

## 2015-03-11 DIAGNOSIS — E119 Type 2 diabetes mellitus without complications: Secondary | ICD-10-CM | POA: Diagnosis present

## 2015-03-11 DIAGNOSIS — R0602 Shortness of breath: Secondary | ICD-10-CM | POA: Diagnosis present

## 2015-03-11 DIAGNOSIS — R0600 Dyspnea, unspecified: Secondary | ICD-10-CM | POA: Diagnosis not present

## 2015-03-11 DIAGNOSIS — D696 Thrombocytopenia, unspecified: Secondary | ICD-10-CM | POA: Diagnosis present

## 2015-03-11 DIAGNOSIS — T451X5A Adverse effect of antineoplastic and immunosuppressive drugs, initial encounter: Secondary | ICD-10-CM | POA: Diagnosis present

## 2015-03-11 DIAGNOSIS — G4700 Insomnia, unspecified: Secondary | ICD-10-CM | POA: Diagnosis present

## 2015-03-11 DIAGNOSIS — E871 Hypo-osmolality and hyponatremia: Secondary | ICD-10-CM

## 2015-03-11 DIAGNOSIS — Z7189 Other specified counseling: Secondary | ICD-10-CM | POA: Diagnosis not present

## 2015-03-11 DIAGNOSIS — E118 Type 2 diabetes mellitus with unspecified complications: Secondary | ICD-10-CM

## 2015-03-11 DIAGNOSIS — I82431 Acute embolism and thrombosis of right popliteal vein: Secondary | ICD-10-CM | POA: Diagnosis present

## 2015-03-11 DIAGNOSIS — M1990 Unspecified osteoarthritis, unspecified site: Secondary | ICD-10-CM | POA: Diagnosis present

## 2015-03-11 DIAGNOSIS — Z79899 Other long term (current) drug therapy: Secondary | ICD-10-CM

## 2015-03-11 DIAGNOSIS — I82621 Acute embolism and thrombosis of deep veins of right upper extremity: Secondary | ICD-10-CM | POA: Diagnosis present

## 2015-03-11 DIAGNOSIS — I5032 Chronic diastolic (congestive) heart failure: Secondary | ICD-10-CM | POA: Diagnosis present

## 2015-03-11 DIAGNOSIS — F411 Generalized anxiety disorder: Secondary | ICD-10-CM | POA: Diagnosis present

## 2015-03-11 DIAGNOSIS — Z87891 Personal history of nicotine dependence: Secondary | ICD-10-CM | POA: Diagnosis not present

## 2015-03-11 DIAGNOSIS — J80 Acute respiratory distress syndrome: Secondary | ICD-10-CM | POA: Diagnosis not present

## 2015-03-11 DIAGNOSIS — E785 Hyperlipidemia, unspecified: Secondary | ICD-10-CM | POA: Diagnosis present

## 2015-03-11 DIAGNOSIS — Z8711 Personal history of peptic ulcer disease: Secondary | ICD-10-CM | POA: Diagnosis not present

## 2015-03-11 DIAGNOSIS — A419 Sepsis, unspecified organism: Principal | ICD-10-CM | POA: Diagnosis present

## 2015-03-11 DIAGNOSIS — D61818 Other pancytopenia: Secondary | ICD-10-CM | POA: Diagnosis not present

## 2015-03-11 LAB — URINALYSIS, MICROSCOPIC - CHCC
Bilirubin (Urine): NEGATIVE
Blood: NEGATIVE
GLUCOSE UR CHCC: NEGATIVE mg/dL
Ketones: NEGATIVE mg/dL
LEUKOCYTE ESTERASE: NEGATIVE
Nitrite: NEGATIVE
PROTEIN: NEGATIVE mg/dL
RBC / HPF: NEGATIVE (ref 0–2)
SPECIFIC GRAVITY, URINE: 1.02 (ref 1.003–1.035)
UROBILINOGEN UR: 0.2 mg/dL (ref 0.2–1)
pH: 6 (ref 4.6–8.0)

## 2015-03-11 LAB — COMPREHENSIVE METABOLIC PANEL (CC13)
ALT: 15 U/L (ref 0–55)
AST: 22 U/L (ref 5–34)
Albumin: 2.6 g/dL — ABNORMAL LOW (ref 3.5–5.0)
Alkaline Phosphatase: 45 U/L (ref 40–150)
Anion Gap: 10 mEq/L (ref 3–11)
BUN: 16.1 mg/dL (ref 7.0–26.0)
CALCIUM: 8.7 mg/dL (ref 8.4–10.4)
CHLORIDE: 96 meq/L — AB (ref 98–109)
CO2: 21 mEq/L — ABNORMAL LOW (ref 22–29)
Creatinine: 1.2 mg/dL (ref 0.7–1.3)
EGFR: 68 mL/min/{1.73_m2} — ABNORMAL LOW (ref >90)
Glucose: 122 mg/dl (ref 70–140)
Sodium: 126 mEq/L — ABNORMAL LOW (ref 136–145)
Total Bilirubin: 0.73 mg/dL (ref 0.20–1.20)
Total Protein: 8.2 g/dL (ref 6.4–8.3)

## 2015-03-11 LAB — CBC WITH DIFFERENTIAL/PLATELET
BASO%: 2.7 % — AB (ref 0.0–2.0)
Basophils Absolute: 0 10*3/uL (ref 0.0–0.1)
EOS%: 1.4 % (ref 0.0–7.0)
Eosinophils Absolute: 0 10*3/uL (ref 0.0–0.5)
HCT: 23.2 % — ABNORMAL LOW (ref 38.4–49.9)
HEMOGLOBIN: 8.1 g/dL — AB (ref 13.0–17.1)
LYMPH%: 68.7 % — ABNORMAL HIGH (ref 14.0–49.0)
MCH: 34.7 pg — AB (ref 27.2–33.4)
MCHC: 34.9 g/dL (ref 32.0–36.0)
MCV: 99.6 fL — ABNORMAL HIGH (ref 79.3–98.0)
MONO#: 0.2 10*3/uL (ref 0.1–0.9)
MONO%: 22.2 % — AB (ref 0.0–14.0)
NEUT%: 5 % — ABNORMAL LOW (ref 39.0–75.0)
NEUTROS ABS: 0 10*3/uL — AB (ref 1.5–6.5)
Platelets: 16 10*3/uL — ABNORMAL LOW (ref 140–400)
RBC: 2.33 10*6/uL — ABNORMAL LOW (ref 4.20–5.82)
RDW: 22.4 % — AB (ref 11.0–14.6)
WBC: 0.8 10*3/uL — AB (ref 4.0–10.3)
lymph#: 0.5 10*3/uL — ABNORMAL LOW (ref 0.9–3.3)

## 2015-03-11 LAB — URINALYSIS, ROUTINE W REFLEX MICROSCOPIC
BILIRUBIN URINE: NEGATIVE
Glucose, UA: NEGATIVE mg/dL
Hgb urine dipstick: NEGATIVE
KETONES UR: NEGATIVE mg/dL
Leukocytes, UA: NEGATIVE
NITRITE: NEGATIVE
PROTEIN: NEGATIVE mg/dL
SPECIFIC GRAVITY, URINE: 1.008 (ref 1.005–1.030)
UROBILINOGEN UA: 0.2 mg/dL (ref 0.0–1.0)
pH: 6 (ref 5.0–8.0)

## 2015-03-11 LAB — BLOOD GAS, ARTERIAL
ACID-BASE DEFICIT: 0.3 mmol/L (ref 0.0–2.0)
BICARBONATE: 20.7 meq/L (ref 20.0–24.0)
Drawn by: 441261
O2 CONTENT: 2 L/min
O2 Saturation: 95.3 %
PATIENT TEMPERATURE: 101.1
PCO2 ART: 26.6 mmHg — AB (ref 35.0–45.0)
PO2 ART: 82.7 mmHg (ref 80.0–100.0)
TCO2: 17.8 mmol/L (ref 0–100)
pH, Arterial: 7.509 — ABNORMAL HIGH (ref 7.350–7.450)

## 2015-03-11 LAB — MRSA PCR SCREENING: MRSA BY PCR: NEGATIVE

## 2015-03-11 LAB — I-STAT CG4 LACTIC ACID, ED: LACTIC ACID, VENOUS: 2.38 mmol/L — AB (ref 0.5–2.0)

## 2015-03-11 LAB — PREPARE PLATELET PHERESIS: UNIT DIVISION: 0

## 2015-03-11 MED ORDER — TRAMADOL HCL 50 MG PO TABS
50.0000 mg | ORAL_TABLET | Freq: Three times a day (TID) | ORAL | Status: DC | PRN
  Administered 2015-03-13 – 2015-03-16 (×3): 50 mg via ORAL
  Filled 2015-03-11 (×3): qty 1

## 2015-03-11 MED ORDER — CLONAZEPAM 0.5 MG PO TABS
0.5000 mg | ORAL_TABLET | Freq: Two times a day (BID) | ORAL | Status: DC | PRN
  Administered 2015-03-12 – 2015-03-17 (×3): 0.5 mg via ORAL
  Filled 2015-03-11 (×3): qty 1

## 2015-03-11 MED ORDER — ATORVASTATIN CALCIUM 10 MG PO TABS
20.0000 mg | ORAL_TABLET | Freq: Every day | ORAL | Status: DC
  Administered 2015-03-12 – 2015-03-18 (×7): 20 mg via ORAL
  Filled 2015-03-11 (×8): qty 2

## 2015-03-11 MED ORDER — INSULIN ASPART 100 UNIT/ML ~~LOC~~ SOLN
0.0000 [IU] | Freq: Every day | SUBCUTANEOUS | Status: DC
  Administered 2015-03-15 – 2015-03-17 (×3): 2 [IU] via SUBCUTANEOUS

## 2015-03-11 MED ORDER — ACETAMINOPHEN 500 MG PO TABS
1000.0000 mg | ORAL_TABLET | Freq: Once | ORAL | Status: AC
  Administered 2015-03-11: 1000 mg via ORAL
  Filled 2015-03-11: qty 2

## 2015-03-11 MED ORDER — SODIUM CHLORIDE 0.9 % IV SOLN
INTRAVENOUS | Status: DC
  Administered 2015-03-11 – 2015-03-14 (×4): via INTRAVENOUS

## 2015-03-11 MED ORDER — DEXTROSE 5 % IV SOLN
2.0000 g | Freq: Three times a day (TID) | INTRAVENOUS | Status: DC
  Administered 2015-03-11 – 2015-03-20 (×26): 2 g via INTRAVENOUS
  Filled 2015-03-11 (×29): qty 2

## 2015-03-11 MED ORDER — TAMSULOSIN HCL 0.4 MG PO CAPS: 0.4000 mg | ORAL_CAPSULE | Freq: Every day | ORAL | Status: DC

## 2015-03-11 MED ORDER — VANCOMYCIN HCL 10 G IV SOLR
1500.0000 mg | Freq: Once | INTRAVENOUS | Status: AC
  Administered 2015-03-11: 1500 mg via INTRAVENOUS
  Filled 2015-03-11: qty 1500

## 2015-03-11 MED ORDER — MONTELUKAST SODIUM 10 MG PO TABS: 10.0000 mg | ORAL_TABLET | Freq: Every day | ORAL | Status: DC

## 2015-03-11 MED ORDER — ACETAMINOPHEN 650 MG RE SUPP: 650.0000 mg | Freq: Four times a day (QID) | RECTAL | Status: DC | PRN

## 2015-03-11 MED ORDER — INSULIN ASPART 100 UNIT/ML ~~LOC~~ SOLN
0.0000 [IU] | Freq: Three times a day (TID) | SUBCUTANEOUS | Status: DC
  Administered 2015-03-13: 2 [IU] via SUBCUTANEOUS
  Administered 2015-03-13 – 2015-03-14 (×2): 3 [IU] via SUBCUTANEOUS
  Administered 2015-03-14 – 2015-03-15 (×2): 2 [IU] via SUBCUTANEOUS
  Administered 2015-03-15: 3 [IU] via SUBCUTANEOUS
  Administered 2015-03-16: 5 [IU] via SUBCUTANEOUS
  Administered 2015-03-16: 2 [IU] via SUBCUTANEOUS
  Administered 2015-03-17: 5 [IU] via SUBCUTANEOUS
  Administered 2015-03-17: 3 [IU] via SUBCUTANEOUS
  Administered 2015-03-17: 5 [IU] via SUBCUTANEOUS
  Administered 2015-03-18: 3 [IU] via SUBCUTANEOUS
  Administered 2015-03-18: 2 [IU] via SUBCUTANEOUS
  Administered 2015-03-20: 8 [IU] via SUBCUTANEOUS
  Administered 2015-03-20: 3 [IU] via SUBCUTANEOUS
  Administered 2015-03-20 – 2015-03-21 (×3): 2 [IU] via SUBCUTANEOUS

## 2015-03-11 MED ORDER — SODIUM CHLORIDE 0.9 % IV BOLUS (SEPSIS)
1000.0000 mL | Freq: Once | INTRAVENOUS | Status: AC
  Administered 2015-03-11: 1000 mL via INTRAVENOUS

## 2015-03-11 MED ORDER — PANTOPRAZOLE SODIUM 40 MG PO TBEC
40.0000 mg | DELAYED_RELEASE_TABLET | Freq: Two times a day (BID) | ORAL | Status: DC
  Administered 2015-03-11 – 2015-03-21 (×20): 40 mg via ORAL
  Filled 2015-03-11 (×20): qty 1

## 2015-03-11 MED ORDER — VANCOMYCIN HCL IN DEXTROSE 1-5 GM/200ML-% IV SOLN
1000.0000 mg | Freq: Two times a day (BID) | INTRAVENOUS | Status: DC
  Administered 2015-03-12 (×2): 1000 mg via INTRAVENOUS
  Filled 2015-03-11 (×3): qty 200

## 2015-03-11 MED ORDER — SODIUM CHLORIDE 0.9 % IJ SOLN
3.0000 mL | Freq: Two times a day (BID) | INTRAMUSCULAR | Status: DC
  Administered 2015-03-11 – 2015-03-20 (×10): 3 mL via INTRAVENOUS

## 2015-03-11 MED ORDER — SODIUM CHLORIDE 0.9 % IV SOLN: 250.0000 mL | Freq: Once | INTRAVENOUS | Status: DC

## 2015-03-11 MED ORDER — PROCHLORPERAZINE MALEATE 10 MG PO TABS: 10.0000 mg | ORAL_TABLET | Freq: Four times a day (QID) | ORAL | Status: DC | PRN

## 2015-03-11 MED ORDER — POTASSIUM CHLORIDE CRYS ER 20 MEQ PO TBCR: 20.0000 meq | EXTENDED_RELEASE_TABLET | Freq: Every day | ORAL | Status: DC

## 2015-03-11 MED ORDER — ACETAMINOPHEN 325 MG PO TABS
650.0000 mg | ORAL_TABLET | Freq: Four times a day (QID) | ORAL | Status: DC | PRN
  Administered 2015-03-12 – 2015-03-20 (×12): 650 mg via ORAL
  Filled 2015-03-11 (×13): qty 2

## 2015-03-11 MED ORDER — SODIUM CHLORIDE 0.9 % IV BOLUS (SEPSIS)
1000.0000 mL | INTRAVENOUS | Status: AC
  Administered 2015-03-11: 1000 mL via INTRAVENOUS

## 2015-03-11 MED ORDER — SODIUM CHLORIDE 0.9 % IV BOLUS (SEPSIS)
500.0000 mL | Freq: Once | INTRAVENOUS | Status: AC
  Administered 2015-03-11: 500 mL via INTRAVENOUS

## 2015-03-11 MED ORDER — HEPARIN SODIUM (PORCINE) 5000 UNIT/ML IJ SOLN: 5000.0000 [IU] | Freq: Three times a day (TID) | INTRAMUSCULAR | Status: DC

## 2015-03-11 MED ORDER — LORATADINE 10 MG PO TABS: 10.0000 mg | ORAL_TABLET | Freq: Every day | ORAL | Status: DC | PRN

## 2015-03-11 MED ORDER — DICLOFENAC SODIUM 1 % TD GEL
2.0000 g | Freq: Four times a day (QID) | TRANSDERMAL | Status: DC
  Administered 2015-03-11 – 2015-03-21 (×23): 2 g via TOPICAL
  Filled 2015-03-11 (×2): qty 100

## 2015-03-11 MED ORDER — SODIUM CHLORIDE 0.9 % IV BOLUS (SEPSIS)
500.0000 mL | INTRAVENOUS | Status: AC
  Administered 2015-03-11: 500 mL via INTRAVENOUS

## 2015-03-11 MED ORDER — TAMSULOSIN HCL 0.4 MG PO CAPS
0.4000 mg | ORAL_CAPSULE | Freq: Two times a day (BID) | ORAL | Status: DC
  Administered 2015-03-11 – 2015-03-12 (×3): 0.4 mg via ORAL
  Filled 2015-03-11 (×3): qty 1

## 2015-03-11 MED ORDER — DEXAMETHASONE 4 MG PO TABS: 20.0000 mg | ORAL_TABLET | ORAL | Status: DC

## 2015-03-11 MED ORDER — TBO-FILGRASTIM 480 MCG/0.8ML ~~LOC~~ SOSY
480.0000 ug | PREFILLED_SYRINGE | SUBCUTANEOUS | Status: DC
  Administered 2015-03-11 – 2015-03-18 (×8): 480 ug via SUBCUTANEOUS
  Filled 2015-03-11 (×12): qty 0.8

## 2015-03-11 MED ORDER — OXYCODONE HCL 5 MG PO TABS
5.0000 mg | ORAL_TABLET | ORAL | Status: DC | PRN
  Administered 2015-03-11 – 2015-03-20 (×9): 5 mg via ORAL
  Filled 2015-03-11 (×9): qty 1

## 2015-03-11 NOTE — Progress Notes (Signed)
I spoke with Dr Benay Spice nurse. I notified her that Mr. Grewe IV was left in place since he was such a hard stick. She acknowledged and stated that she would remove the IV site out, if not needed.

## 2015-03-11 NOTE — Assessment & Plan Note (Signed)
Patient recieved his 3rd cycle of daratumumab chemotherapy on 03/07/2015.  Patient reports progressive left lower back molar dental infection; with fever to maximum 103.0 earlier this morning.  Blood counts obtained today reveal a WBC of 0.8, ANC 0.0, hemoglobin 8.1, and platelet count 16.  Patient received platelet transfusion on Friday, 03/07/2015.  Patient received blood transfusion just yesterday 03/10/2015.  Due to progressive dental infection, neutropenic fever, and possible impending sepsis-patient was transported to the emergency department for further evaluation and management this afternoon.  Patient was scheduled for Port-A-Cath placement today; but this will have to be rescheduled once patient has recovered.  Patient is scheduled for labs, visit, his next cycle of chemotherapy on 03/14/2015.

## 2015-03-11 NOTE — Progress Notes (Signed)
ANTIBIOTIC CONSULT NOTE - INITIAL  Pharmacy Consult for Vancomycin and Aztreonam Indication: Febrile neutropenia  Allergies  Allergen Reactions  . Penicillins Hives and Rash    Patient Measurements:   Adjusted Body Weight:   Vital Signs: Temp: 101.1 F (38.4 C) (08/30 1432) Temp Source: Oral (08/30 1432) BP: 142/73 mmHg (08/30 1432) Pulse Rate: 112 (08/30 1432) Intake/Output from previous day:   Intake/Output from this shift:    Labs:  Recent Labs  03/10/15 0836 03/11/15 1145 03/11/15 1145  WBC 1.3* 0.8*  --   HGB 7.9* 8.1*  --   PLT 16* 16*  --   CREATININE  --   --  1.2   Estimated Creatinine Clearance: 63.7 mL/min (by C-G formula based on Cr of 1.2). No results for input(s): VANCOTROUGH, VANCOPEAK, VANCORANDOM, GENTTROUGH, GENTPEAK, GENTRANDOM, TOBRATROUGH, TOBRAPEAK, TOBRARND, AMIKACINPEAK, AMIKACINTROU, AMIKACIN in the last 72 hours.   Microbiology: Recent Results (from the past 720 hour(s))  TECHNOLOGIST REVIEW     Status: None   Collection Time: 02/14/15  9:06 AM  Result Value Ref Range Status   Technologist Review mod targets  Final  TECHNOLOGIST REVIEW     Status: None   Collection Time: 03/10/15  8:36 AM  Result Value Ref Range Status   Technologist Review 2% Blast  Final    Medical History: Past Medical History  Diagnosis Date  . GERD (gastroesophageal reflux disease)   . Essential hypertension, benign   . BPH (benign prostatic hyperplasia)   . Lumbar herniated disc     L4-5  . Hyperlipidemia   . Sleep apnea   . Multiple myeloma 02/19/2008  . Arthritis   . Diverticulosis   . Peptic ulcer disease   . Tubular adenoma 02/16/2008    Dr. Silvano Rusk  . MDS (myelodysplastic syndrome) 06/17/2014  . Diabetes mellitus without complication     Medications:  Anti-infectives    None     Assessment: 79yo M from the [place] with suspected early sepsis, febrile, tachycardic, and neutropenic. Possible tooth infection although the patient  reports this feels better after recent antibiotics. He has been on Cipro since 02/21/15. Pharmacy is asked to dose Vancomycin and Aztreonam(penicillin allergic).   Temp 101.1  ANC 0  SCr 1.2, CrCl ~64CG  Goal of Therapy:  Vancomycin trough level 15-20 mcg/ml  Appropriate antibiotic dosing for renal function; eradication of infection  Plan:  Vancomycin $RemoveBef'1500mg'nZwlkKLEAM$  x 1 then 1g IV q12h. Aztreonam 2g IV q8h. Measure Vanc trough at steady state. Follow up renal fxn, culture results, and clinical course.  Romeo Rabon, PharmD, pager [phone]. 03/11/2015,3:42 PM.

## 2015-03-11 NOTE — ED Notes (Signed)
RN states pt has been stuck over 4 times with low hemoglobin. Unable to obtain blood.

## 2015-03-11 NOTE — Progress Notes (Signed)
Utilization Review completed.  Deangelo Berns RN CM  

## 2015-03-11 NOTE — H&P (Addendum)
[facility] History and Physical  Kingsley Farace GNF:[id number] DOB: [birth year] DOA: 03/11/2015  Referring physician: Dr Malcolm Metro PCP: Sharion Balloon, FNP   Chief Complaint: leg pain and SOB  HPI: [name] is a 79 y.o. male  Patient states that he can reschedule he developed a painful tooth on his left mandible. Patient was started and a biotics 1 day ago. Has been no change in his pain since starting the antibiotic. Denies any discharge out of the tooth. Some soft tissue swelling around the tooth. Additionally, yesterday evening when he developed bilateral crampy leg pain. This made it difficult for her to walk. Pain is constant. Denies leg swelling. Getting worse. Patient states that this morning when he woke up he was short of breath. This is worsened by ambulation. Excision with a general ill feeling. Has not taken any medicines for his symptoms. Patient's last chemotherapy was on Friday. Patient was scheduled for port placement today but was found to be febrile and seen here in the ED.    Review of Systems:  Constitutional:  No weight loss, night sweats, HEENT:  No headaches, Difficulty swallowing,Tooth/dental problems,Sore throat,  No sneezing, itching, ear ache, nasal congestion, post nasal drip,  Cardio-vascular:  No chest pain, Orthopnea, PND, anasarca, dizziness, palpitations  GI:  No heartburn, indigestion, abdominal pain, nausea, vomiting, diarrhea, change in bowel habits, loss of appetite  Resp:  Per HPI Skin:  no rash or lesions.  GU:  no dysuria, change in color of urine, no urgency or frequency. No flank pain.  Musculoskeletal:   No joint pain or swelling. No decreased range of motion. No back pain.  Psych:  No change in mood or affect. No depression or anxiety. No memory loss.   Past Medical History  Diagnosis Date  . GERD (gastroesophageal reflux disease)   . Essential hypertension, benign   . BPH (benign prostatic hyperplasia)   . Lumbar  herniated disc     L4-5  . Hyperlipidemia   . Sleep apnea   . Multiple myeloma 02/19/2008  . Arthritis   . Diverticulosis   . Peptic ulcer disease   . Tubular adenoma 02/16/2008    Dr. Silvano Rusk  . MDS (myelodysplastic syndrome) 06/17/2014  . Diabetes mellitus without complication    Past Surgical History  Procedure Laterality Date  . Neck surgery    . Lumbar laminectomy/decompression microdiscectomy  11/01/2011    Procedure: LUMBAR LAMINECTOMY/DECOMPRESSION MICRODISCECTOMY 2 LEVELS;  Surgeon: Charlie Pitter, MD;  Location: [facility];  Service: Neurosurgery;  Laterality: Right;  Lumbar Laminectomy/Microdiscectomy Decompression Lumbar Three-Four, Lumbar Four-Five   . Colonoscopy    . Hemorrhoid banding  2014  . Esophagogastroduodenoscopy     Social History:  reports that he quit smoking about 24 years ago. His smoking use included Cigarettes. He started smoking about 52 years ago. He has a 40 pack-year smoking history. He has never used smokeless tobacco. He reports that he drinks alcohol. He reports that he does not use illicit drugs.  Allergies  Allergen Reactions  . Penicillins Hives and Rash    Family History  Problem Relation Age of Onset  . Adopted: Yes     Prior to Admission medications   Medication Sig Start Date End Date Taking? Authorizing Provider  acetaminophen (TYLENOL) 500 MG tablet Take 500 mg by mouth every 6 (six) hours as needed for moderate pain.   Yes Historical Provider, MD  acyclovir (ZOVIRAX) 400 MG tablet TAKE 1 TABLET (400 MG TOTAL)  BY MOUTH 2 (TWO) TIMES DAILY. 07/30/14  Yes Ladene Artist, MD  albuterol (PROVENTIL HFA;VENTOLIN HFA) 108 (90 BASE) MCG/ACT inhaler Inhale 2 puffs into the lungs every 6 (six) hours as needed for wheezing or shortness of breath. 02/13/15  Yes Ladene Artist, MD  atorvastatin (LIPITOR) 20 MG tablet TAKE 1 TABLET (20 MG TOTAL) BY MOUTH DAILY.   Yes Deatra Canter, FNP  canagliflozin (INVOKANA) 100 MG TABS tablet Take 1 tablet  (100 mg total) by mouth daily. 05/21/14  Yes Chari Manning, PHARMD  ciprofloxacin (CIPRO) 500 MG tablet Take 1 tablet (500 mg total) by mouth 2 (two) times daily. 02/21/15  Yes Rana Snare, NP  clonazePAM (KLONOPIN) 0.5 MG tablet TAKE ONE TO TWO TABLETS BY MOUTH 30 MINUTES BEFORE BEDTIME AS NEEDED 10/22/14  Yes Waymon Budge, MD  dexamethasone (DECADRON) 4 MG tablet Take 1 tablet (4 mg total) by mouth 2 (two) times daily with a meal. For 2 days. Begin day after each chemo treatment. 02/13/15  Yes Ladene Artist, MD  dexamethasone (DECADRON) 4 MG tablet Take 20 mg (5 tablets) once a week. Patient taking differently: Take 20 mg by mouth every Friday.  02/21/15  Yes Rana Snare, NP  diclofenac sodium (VOLTAREN) 1 % GEL Apply 2 g topically 4 (four) times daily. 09/13/14  Yes Junie Spencer, FNP  glucose blood (ONETOUCH VERIO) test strip 1 each by Other route as needed for other. Use as instructed 08/21/14  Yes Junie Spencer, FNP  KLOR-CON M20 20 MEQ tablet TAKE ONE TABLET BY MOUTH ONCE DAILY 02/27/15  Yes Junie Spencer, FNP  loratadine (CLARITIN) 10 MG tablet TAKE 1 TABLET (10 MG TOTAL) BY MOUTH DAILY. 09/18/14  Yes Junie Spencer, FNP  montelukast (SINGULAIR) 10 MG tablet Take 1 tablet (10 mg total) by mouth at bedtime. Day before and day of each chemo treatment. 02/13/15  Yes Ladene Artist, MD  [facility] DELICA LANCETS FINE MISC 1 each by Does not apply route daily. 08/21/14  Yes Junie Spencer, FNP  pantoprazole (PROTONIX) 40 MG tablet Take 1 tablet (40 mg total) by mouth 2 (two) times daily. 02/21/15  Yes Rana Snare, NP  polyethylene glycol powder (GLYCOLAX/MIRALAX) powder TAKE 1-2 DOSES DAILY AS NEEDED Patient taking differently: TAKE 1-2 DOSES daily 06/24/14  Yes Iva Boop, MD  PRESCRIPTION MEDICATION Chemo - [facility]   Yes Historical Provider, MD  prochlorperazine (COMPAZINE) 10 MG tablet TAKE 1 TABLET BY MOUTH EVERY SIX HOURS AS NEEDED   Yes Ladene Artist, MD  tamsulosin (FLOMAX) 0.4  MG CAPS capsule TAKE 1 CAPSULE (0.4 MG TOTAL) BY MOUTH 2 (TWO) TIMES DAILY. WITH A MEAL. 05/28/14  Yes Deatra Canter, FNP  traMADol (ULTRAM) 50 MG tablet Take 1 tablet (50 mg total) by mouth every 8 (eight) hours as needed. Patient taking differently: Take 50 mg by mouth every 8 (eight) hours as needed for moderate pain.  03/07/15  Yes Tillman Abide, NP  lactulose (CHRONULAC) 10 GM/15ML solution Take 15 mLs (10 g total) by mouth 2 (two) times daily as needed. Patient not taking: Reported on 03/11/2015 12/23/14   Rana Snare, NP  metFORMIN (GLUCOPHAGE) 500 MG tablet TAKE 1 TABLET (500 MG TOTAL) BY MOUTH 2 (TWO) TIMES DAILY WITH A MEAL. Patient not taking: Reported on 03/11/2015 12/19/14   Junie Spencer, FNP   Physical Exam: Filed Vitals:   03/11/15 1432 03/11/15 1547 03/11/15 1635 03/11/15 1800  BP: 142/73 104/48  114/49  Pulse: 112 107  89  Temp: 101.1 F (38.4 C)     TempSrc: Oral     Resp: $Remo'20 29  21  'MLmlT$ Height:   6' (1.829 m)   Weight:   101.152 kg (223 lb)   SpO2: 94% 96%  99%    Wt Readings from Last 3 Encounters:  03/11/15 101.152 kg (223 lb)  03/03/15 101.923 kg (224 lb 11.2 oz)  02/28/15 102.468 kg (225 lb 14.4 oz)    General:  Appears calm and comfortable Eyes:  PERRL, normal lids, irises & conjunctiva ENT:  First remaining L mandibular molar ttp and moves easliy. Minimal surouding soft tissue swelling.  Neck:  no LAD, masses or thyromegaly Cardiovascular:  RRR, no m/r/g, trace LE edema Respiratory: mild decreased breath sounds with few crackles in the bases. Normal effort  Abdomen:  soft, ntnd Skin:  no rash or induration seen on limited exam Musculoskeletal:  grossly normal tone BUE/BLE Psychiatric:  grossly normal mood and affect, speech fluent and appropriate Neurologic:  grossly non-focal.          Labs on Admission:  Basic Metabolic Panel:  Recent Labs Lab 03/07/15 0926 03/11/15 1145  NA 128* 126*  K 4.6 4.4 Sl. Hemolysis  CO2 23 21*  GLUCOSE 137 122    BUN 14.4 16.1  CREATININE 1.1 1.2  CALCIUM 8.6 8.7   Liver Function Tests:  Recent Labs Lab 03/07/15 0926 03/11/15 1145  AST 14 22  ALT 12 15  ALKPHOS 45 45  BILITOT 0.65 0.73  PROT 8.2 8.2  ALBUMIN 2.7* 2.6*   No results for input(s): LIPASE, AMYLASE in the last 168 hours. No results for input(s): AMMONIA in the last 168 hours. CBC:  Recent Labs Lab 03/07/15 0926 03/10/15 0836 03/11/15 1145  WBC 0.6* 1.3* 0.8*  NEUTROABS 0.1* 0.1* 0.0*  HGB 9.1* 7.9* 8.1*  HCT 25.8* 21.7* 23.2*  MCV 99.0* 92.7 99.6*  PLT 8* 16* 16*   Cardiac Enzymes: No results for input(s): CKTOTAL, CKMB, CKMBINDEX, TROPONINI in the last 168 hours.  BNP (last 3 results) No results for input(s): BNP in the last 8760 hours.  ProBNP (last 3 results) No results for input(s): PROBNP in the last 8760 hours.  CBG: No results for input(s): GLUCAP in the last 168 hours.  Radiological Exams on Admission: Dg Chest 2 View  03/11/2015   CLINICAL DATA:  Fever with weakness. Possible sepsis from tooth infection. Multiple myeloma. Initial encounter.  EXAM: CHEST  2 VIEW  COMPARISON:  02/28/2015 and 11/21/2014 radiographs.  FINDINGS: The heart size and mediastinal contours are stable. There are slightly lower lung volumes. On today's lateral view, there is increased density in the retrosternal clear space which may correspond with faint lingular opacity on the frontal examination. There is no consolidation or significant pleural effusion. Telemetry leads overlie the chest. Previous lower cervical fusion noted. There are lucencies in the proximal right humerus attributed to the patient's multiple myeloma.  IMPRESSION: Possible developing lingular pneumonia.   Electronically Signed   By: Richardean Sale M.D.   On: 03/11/2015 15:42     Assessment/Plan Principal Problem:   Sepsis Active Problems:   GERD (gastroesophageal reflux disease)   Neutropenic fever   Chronic diastolic congestive heart failure    Hyponatremia   Dental infection   Diabetes mellitus with complication   Anxiety state   Thrombocytopenia  Neutropenic fever/SEPSIS: Likely secondary to HCAP from ongoing chemotherapy. Lactic acid 2.38, tachycardic, febrile,  tachypnea, WBC 0.8. Neutrophils 5%.  - Stepdown - Sepsis protocol - Vanc, Aztreonam - BCX, UCX, sputum Cx - legionella Ag, strep Ag - procalcitonin - IVF - neutropenic precautions - D-dimer (f/u CTA if +)  Dental infection: Remaining first left mandibular molar very loose with significant surrounding erythema but no appreciable fluctuance. Dental consult ordered by oncology per family - antibiotics as above - Follow-up dental recommendations  Hyponatremia: 126 on admission. Previous 128 on 8/26 and 131 on 8/22. Goal of normalization no faster than 71mEq/L per day  - IVF NS - BMET Q12.   Multiple Myeloma: ongoing treatment. Last chemo 5 days ago. All hematologic cell lines down. - continue Decadron Qwkly (Friday) - Oncology aware and will follow (sent pt to hospital from clinic)  DM: on invokana and metforman - A1c - SSI  Chronic diastolic CHF: Last echo showing EF of 01-09% grade 1 diastolic dysfunction. No evidence of fluid overload at this time. We'll have to monitor closely given sepsis.  - Monitor.   ANxiety/insomnia: - continue Klonopin  BPH: - continue flomax  Code Status: FULL DVT Prophylaxis: SCD Family Communication: WIfe Disposition Plan: PEnding improvement  MERRELL, DAVID J, MD Family Medicine [facility] [email or web address] Password [email or web address]

## 2015-03-11 NOTE — ED Notes (Signed)
Pt, being sent by [facility], c/o fever and possible sepsis.  Retta Mac DNP reports the Pt has a dental abscess that could potentially be causing the fever.  Hx of multiple myeloma. Last chemo x 4 days ago.  [facility] staff has spoken to Hospitalist, [place] MD and he would like [place] to initiate sepsis protocols.

## 2015-03-11 NOTE — ED Notes (Signed)
Bed: WA04 Expected date:  Expected time:  Means of arrival:  Comments: Pt from [facility]

## 2015-03-11 NOTE — ED Notes (Signed)
Pt brought over from cancer center. He was suppose to get a port placed today, upon arrival pts temp was elevated 103.0 not able to place. Pt was also seen yesterday and was suppose to have a blood transfusion for hgb 8.1 , due to low grade fever not able to get. Placed a 20g iv prior they was not able to obtain blood. Has also been stuck multiple times to obtain labs not still not able to get from pt. Pt weak and possible septic from tooth infection.

## 2015-03-11 NOTE — Assessment & Plan Note (Signed)
Patient is complaining of left lower back molar pain and inflammation for approximately one week.  Patient was seen last week for the same complaint; and was advised to continue taking Cipro antibiotics.  Patient was evaluated per Dr. Baltazar Najjar oral surgeon just yesterday 03/10/2015; and was advised that he did indeed have a dental infection.  Due to patient's significantly low blood count-Dr. Derenda Mis advised patient he would have to hold on any dental extraction or other oral surgery.  Patient was given a new antibiotic and oral rinse agent yesterday; but patient is unclear of the actual name of this antibiotic.  Patient reports increased dental pain and fever to maximum of 103.0 earlier this morning.  He is also complaining of significant chills as well.  On exam, it appears that the left lower back molar area has now significantly progressed; with the gum area directly below the tooth now ulcerated with some purulent discharge.  The area remains very tender with any manipulation whatsoever.  Patient was shaking with chills on exam.  Vital signs while at the [place] [facility] revealed blood pressure 110/49, heart rate 116, and temperature 100.2.  Blood counts obtained today reveal a WBC of 0.8, ANC 0.0, hemoglobin 8.1, platelet count 16.  Patient received platelets on 03/07/2015; and received blood transfusion just yesterday 03/11/2015.  Despite multiple attempts; was unable to obtain blood culture while at the cancer center.  After speaking with hospitalist, Dr.Gherghe- decision was made to transport patient to the emergency department for further evaluation and management of dental infection, neutropenic fever, and possible impending sepsis.  Hospitalist recommended that sepsis protocol be initiated once patient arrives to the emergency department.  Brief history report were called to the emergency department charge nurse prior to patient being transported to the emergency department via wheelchair  per the [place].  Emergency department charge nurse is aware that the [place], was unable to obtain blood culture sample.

## 2015-03-11 NOTE — Assessment & Plan Note (Signed)
Patient is complaining of left lower back molar pain and inflammation for approximately one week.  Patient was seen last week for the same complaint; and was advised to continue taking Cipro antibiotics.  Patient was evaluated per Dr. Baltazar Najjar oral surgeon just yesterday 03/10/2015; and was advised that he did indeed have a dental infection.  Due to patient's significantly low blood count-Dr. Derenda Mis advised patient he would have to hold on any dental extraction or other oral surgery.  Patient was given a new antibiotic and oral rinse agent yesterday; but patient is unclear of the actual name of this antibiotic.  Patient reports increased dental pain and fever to maximum of 103.0 earlier this morning.  He is also complaining of significant chills as well.  On exam, it appears that the left lower back molar area has now significantly progressed; with the gum area directly below the tooth now ulcerated with some purulent discharge.  The area remains very tender with any manipulation whatsoever.  Patient was shaking with chills on exam.  Vital signs while at the [place] revealed blood pressure 110/49, heart rate 116, and temperature 100.2.  Despite multiple attempts; was unable to obtain blood culture while at the cancer center.  After speaking with hospitalist, Dr.Gherghe- decision was made to transport patient to the emergency department for further evaluation and management of dental infection, neutropenic fever, and possible impending sepsis.  Brief history report were called to the emergency department charge nurse prior to patient being transported to the emergency department via wheelchair per the [place].

## 2015-03-11 NOTE — ED Notes (Signed)
Called resp to obtain blood gas and obtain labs.

## 2015-03-11 NOTE — ED Notes (Signed)
Admitting MD at bedside.

## 2015-03-11 NOTE — ED Provider Notes (Signed)
CSN: [id number]     Arrival date & time 03/11/15  1424 History   First MD Initiated Contact with Patient 03/11/15 1449     Chief Complaint  Patient presents with  . Fever  . Weakness     Patient is a 79 y.o. male presenting with fever and weakness. The history is provided by the patient. No language interpreter was used.  Fever Weakness   [name] presents from the cancer center for evaluation of fever. He is currently undergoing treatment for multiple myeloma and myelodysplastic syndrome. He was scheduled to have a routine transfusion today nd then get a port placed but he was found to be febrile in the clinic to 103. He reports right-sided rib pain and feeling poorly in general. He denies any additional symptoms. He denies any shortness of breath, cough, abdominal pain, vomiting, diarrhea, dysuria. He was recently treated for dental infection and that he feels like his mouth is improving currently.  Past Medical History  Diagnosis Date  . GERD (gastroesophageal reflux disease)   . Essential hypertension, benign   . BPH (benign prostatic hyperplasia)   . Lumbar herniated disc     L4-5  . Hyperlipidemia   . Sleep apnea   . Multiple myeloma 02/19/2008  . Arthritis   . Diverticulosis   . Peptic ulcer disease   . Tubular adenoma 02/16/2008    Dr. Stan Head  . MDS (myelodysplastic syndrome) 06/17/2014  . Diabetes mellitus without complication    Past Surgical History  Procedure Laterality Date  . Neck surgery    . Lumbar laminectomy/decompression microdiscectomy  11/01/2011    Procedure: LUMBAR LAMINECTOMY/DECOMPRESSION MICRODISCECTOMY 2 LEVELS;  Surgeon: Temple Pacini, MD;  Location: [facility];  Service: Neurosurgery;  Laterality: Right;  Lumbar Laminectomy/Microdiscectomy Decompression Lumbar Three-Four, Lumbar Four-Five   . Colonoscopy    . Hemorrhoid banding  2014  . Esophagogastroduodenoscopy     Family History  Problem Relation Age of Onset  . Adopted: Yes   Social  History  Substance Use Topics  . Smoking status: Former Smoker -- 2.00 packs/day for 20 years    Types: Cigarettes    Start date: 09/15/1962    Quit date: 07/12/1990  . Smokeless tobacco: Never Used  . Alcohol Use: 0.0 oz/week    0 Standard drinks or equivalent per week     Comment: "Very seldom"    Review of Systems  Constitutional: Positive for fever.  Neurological: Positive for weakness.  All other systems reviewed and are negative.     Allergies  Penicillins  Home Medications   Prior to Admission medications   Medication Sig Start Date End Date Taking? Authorizing Provider  glucose blood (ONETOUCH VERIO) test strip 1 each by Other route as needed for other. Use as instructed 08/21/14  Yes Junie Spencer, FNP  [facility] DELICA LANCETS FINE MISC 1 each by Does not apply route daily. 08/21/14  Yes Junie Spencer, FNP  PRESCRIPTION MEDICATION Chemo - [facility]   Yes Historical Provider, MD  acetaminophen (TYLENOL) 500 MG tablet Take 500 mg by mouth every 6 (six) hours as needed for moderate pain.    Historical Provider, MD  acyclovir (ZOVIRAX) 400 MG tablet TAKE 1 TABLET (400 MG TOTAL) BY MOUTH 2 (TWO) TIMES DAILY. 07/30/14   Ladene Artist, MD  albuterol (PROVENTIL HFA;VENTOLIN HFA) 108 (90 BASE) MCG/ACT inhaler Inhale 2 puffs into the lungs every 6 (six) hours as needed for wheezing or shortness of breath. 02/13/15  Ladene Artist, MD  atorvastatin (LIPITOR) 20 MG tablet TAKE 1 TABLET (20 MG TOTAL) BY MOUTH DAILY.    Deatra Canter, FNP  canagliflozin (INVOKANA) 100 MG TABS tablet Take 1 tablet (100 mg total) by mouth daily. 05/21/14   Chari Manning, PHARMD  ciprofloxacin (CIPRO) 500 MG tablet Take 1 tablet (500 mg total) by mouth 2 (two) times daily. 02/21/15   Rana Snare, NP  clonazePAM (KLONOPIN) 0.5 MG tablet TAKE ONE TO TWO TABLETS BY MOUTH 30 MINUTES BEFORE BEDTIME AS NEEDED 10/22/14   Waymon Budge, MD  dexamethasone (DECADRON) 4 MG tablet Take 1 tablet (4 mg total)  by mouth 2 (two) times daily with a meal. For 2 days. Begin day after each chemo treatment. 02/13/15   Ladene Artist, MD  dexamethasone (DECADRON) 4 MG tablet Take 20 mg (5 tablets) once a week. 02/21/15   Rana Snare, NP  diclofenac sodium (VOLTAREN) 1 % GEL Apply 2 g topically 4 (four) times daily. 09/13/14   Junie Spencer, FNP  KLOR-CON M20 20 MEQ tablet TAKE ONE TABLET BY MOUTH ONCE DAILY 02/27/15   Junie Spencer, FNP  lactulose (CHRONULAC) 10 GM/15ML solution Take 15 mLs (10 g total) by mouth 2 (two) times daily as needed. 12/23/14   Rana Snare, NP  loratadine (CLARITIN) 10 MG tablet TAKE 1 TABLET (10 MG TOTAL) BY MOUTH DAILY. 09/18/14   Junie Spencer, FNP  metFORMIN (GLUCOPHAGE) 500 MG tablet TAKE 1 TABLET (500 MG TOTAL) BY MOUTH 2 (TWO) TIMES DAILY WITH A MEAL. 12/19/14   Junie Spencer, FNP  montelukast (SINGULAIR) 10 MG tablet Take 1 tablet (10 mg total) by mouth at bedtime. Day before and day of each chemo treatment. 02/13/15   Ladene Artist, MD  pantoprazole (PROTONIX) 40 MG tablet Take 1 tablet (40 mg total) by mouth 2 (two) times daily. 02/21/15   Rana Snare, NP  polyethylene glycol powder [facility]) powder TAKE 1-2 DOSES DAILY AS NEEDED 06/24/14   Iva Boop, MD  prochlorperazine (COMPAZINE) 10 MG tablet TAKE 1 TABLET BY MOUTH EVERY SIX HOURS AS NEEDED    Ladene Artist, MD  tamsulosin (FLOMAX) 0.4 MG CAPS capsule TAKE 1 CAPSULE (0.4 MG TOTAL) BY MOUTH 2 (TWO) TIMES DAILY. WITH A MEAL. 05/28/14   Deatra Canter, FNP  traMADol (ULTRAM) 50 MG tablet Take 1 tablet (50 mg total) by mouth every 8 (eight) hours as needed. 03/07/15   Tillman Abide, NP   BP 142/73 mmHg  Pulse 112  Temp(Src) 101.1 F (38.4 C) (Oral)  Resp 20  SpO2 94% Physical Exam  Constitutional: He is oriented to person, place, and time. He appears well-developed and well-nourished.  Uncomfortable appearing  HENT:  Head: Normocephalic and atraumatic.  No gingival induration or erythema   Cardiovascular: Regular rhythm.   No murmur heard. Tachycardic  Pulmonary/Chest: Effort normal and breath sounds normal. No respiratory distress.  Abdominal: Soft. There is no tenderness. There is no rebound and no guarding.  Musculoskeletal: He exhibits no edema or tenderness.  Neurological: He is alert and oriented to person, place, and time.  Skin: Skin is warm and dry.  Psychiatric: He has a normal mood and affect. His behavior is normal.  Nursing note and vitals reviewed.   ED Course  Procedures (including critical care time) Labs Review Labs Reviewed  BLOOD GAS, ARTERIAL - Abnormal; Notable for the following:    pH, Arterial 7.509 (*)  pCO2 arterial 26.6 (*)    All other components within normal limits  I-STAT CG4 LACTIC ACID, ED - Abnormal; Notable for the following:    Lactic Acid, Venous 2.38 (*)    All other components within normal limits  MRSA PCR SCREENING  CULTURE, BLOOD (ROUTINE X 2)  CULTURE, BLOOD (ROUTINE X 2)  URINE CULTURE  URINE CULTURE  CULTURE, EXPECTORATED SPUTUM-ASSESSMENT  URINALYSIS, ROUTINE W REFLEX MICROSCOPIC (NOT AT [facility])  LACTIC ACID, PLASMA  LACTIC ACID, PLASMA  PROTIME-INR  APTT  FIBRINOGEN  HEMOGLOBIN A1C  LEGIONELLA ANTIGEN, URINE  STREP PNEUMONIAE URINARY ANTIGEN  D-DIMER, QUANTITATIVE (NOT AT [facility])    Imaging Review Dg Chest 2 View  03/11/2015   CLINICAL DATA:  Fever with weakness. Possible sepsis from tooth infection. Multiple myeloma. Initial encounter.  EXAM: CHEST  2 VIEW  COMPARISON:  02/28/2015 and 11/21/2014 radiographs.  FINDINGS: The heart size and mediastinal contours are stable. There are slightly lower lung volumes. On today's lateral view, there is increased density in the retrosternal clear space which may correspond with faint lingular opacity on the frontal examination. There is no consolidation or significant pleural effusion. Telemetry leads overlie the chest. Previous lower cervical fusion noted. There are  lucencies in the proximal right humerus attributed to the patient's multiple myeloma.  IMPRESSION: Possible developing lingular pneumonia.   Electronically Signed   By: Richardean Sale M.D.   On: 03/11/2015 15:42   I have personally reviewed and evaluated these images and lab results as part of my medical decision-making.   EKG Interpretation None      MDM   Final diagnoses:  Neutropenic fever    Patient with multiple myeloma and myelodysplastic syndrome here for evaluation of fever. Reviewed outpatient labs, patient with ANC of 0. Chest x-ray is consistent with pneumonia, treating for neutropenic fever with HCAP with IV antibiotics and fluids.  Discussed with patient need for admission for further treatment.  Discussed with hospitalist regarding admission for further mgmt.    Quintella Reichert, MD 03/11/15 [phone]

## 2015-03-11 NOTE — ED Notes (Signed)
Attempted to call report to floor. No answer from transfer.

## 2015-03-11 NOTE — Progress Notes (Addendum)
Called by Drue Second NP from cancer center regarding direct admission for [name]. He appears to have early sepsis due to a possible tooth infection with fever up to 103, tachycardia. He is febrile, tachycardic and neutropenic. They have an IV but have been unable to obtain blood cultures. I think he will receive quicker care if he comes in via the ED, and have sepsis protocol started right away, as compared to coming directly on the floor.   [facility] to be notified for admission by EDP, blood work already obtained in the cancer center.  Jaileen Janelle M. Cruzita Lederer, MD [facility] [phone]

## 2015-03-11 NOTE — Progress Notes (Signed)
SYMPTOM MANAGEMENT CLINIC   HPI: [name] 79 y.o. male diagnosed with multiple myeloma.  Currently undergoing Daratumumab chemotherapy regimen.   Patient is complaining of left lower back molar pain and inflammation for approximately one week.  Patient was seen last week for the same complaint; and was advised to continue taking Cipro antibiotics.  Patient was evaluated per Dr. Baltazar Najjar oral surgeon just yesterday 03/10/2015; and was advised that he did indeed have a dental infection.  Due to patient's significantly low blood count-Dr. Derenda Mis advised patient he would have to hold on any dental extraction or other oral surgery.  Patient was given a new antibiotic and oral rinse agent yesterday; but patient is unclear of the actual name of this antibiotic.  Patient reports increased dental pain and fever to maximum of 103.0 earlier this morning.  He is also complaining of significant chills as well.  HPI  ROS  Past Medical History  Diagnosis Date  . GERD (gastroesophageal reflux disease)   . Essential hypertension, benign   . BPH (benign prostatic hyperplasia)   . Lumbar herniated disc     L4-5  . Hyperlipidemia   . Sleep apnea   . Multiple myeloma 02/19/2008  . Arthritis   . Diverticulosis   . Peptic ulcer disease   . Tubular adenoma 02/16/2008    Dr. Silvano Rusk  . MDS (myelodysplastic syndrome) 06/17/2014  . Diabetes mellitus without complication     Past Surgical History  Procedure Laterality Date  . Neck surgery    . Lumbar laminectomy/decompression microdiscectomy  11/01/2011    Procedure: LUMBAR LAMINECTOMY/DECOMPRESSION MICRODISCECTOMY 2 LEVELS;  Surgeon: Charlie Pitter, MD;  Location: [facility];  Service: Neurosurgery;  Laterality: Right;  Lumbar Laminectomy/Microdiscectomy Decompression Lumbar Three-Four, Lumbar Four-Five   . Colonoscopy    . Hemorrhoid banding  2014  . Esophagogastroduodenoscopy      has Obstructive sleep apnea; Insomnia with sleep apnea;  Multiple myeloma; Lumbar disc herniation with radiculopathy; Renal insufficiency; Constipation; Personal history of colonic adenoma; Essential hypertension, benign; Mixed hyperlipidemia; Other malaise and fatigue; Hemorrhoids, internal, with bleeding and grade 2 prolapse; Seasonal allergic rhinitis; Recurrent isolated sleep paralysis; Dyspnea on exertion; Chest congestion; Fever, unspecified; Low oxygen saturation; Asthma with COPD; Hypoxia; SOB (shortness of breath); Pneumonia; Diastolic CHF; GERD (gastroesophageal reflux disease); Fatigue; Chest pain; Shoulder pain; Hypoalbuminemia due to protein-calorie malnutrition; Peripheral edema; MDS (myelodysplastic syndrome); Anal skin tags; Diabetes mellitus without complication; Pain, dental; Sepsis; and Neutropenic fever on his problem list.    is allergic to penicillins.    Medication List       This list is accurate as of: 03/11/15  2:24 PM.  Always use your most recent med list.               acetaminophen 500 MG tablet  Commonly known as:  TYLENOL  Take 500 mg by mouth every 6 (six) hours as needed for moderate pain.     acyclovir 400 MG tablet  Commonly known as:  ZOVIRAX  TAKE 1 TABLET (400 MG TOTAL) BY MOUTH 2 (TWO) TIMES DAILY.     albuterol 108 (90 BASE) MCG/ACT inhaler  Commonly known as:  PROVENTIL HFA;VENTOLIN HFA  Inhale 2 puffs into the lungs every 6 (six) hours as needed for wheezing or shortness of breath.     atorvastatin 20 MG tablet  Commonly known as:  LIPITOR  TAKE 1 TABLET (20 MG TOTAL) BY MOUTH DAILY.     canagliflozin 100 MG Tabs tablet  Commonly known as:  INVOKANA  Take 1 tablet (100 mg total) by mouth daily.     ciprofloxacin 500 MG tablet  Commonly known as:  CIPRO  Take 1 tablet (500 mg total) by mouth 2 (two) times daily.     clonazePAM 0.5 MG tablet  Commonly known as:  KLONOPIN  TAKE ONE TO TWO TABLETS BY MOUTH 30 MINUTES BEFORE BEDTIME AS NEEDED     dexamethasone 4 MG tablet  Commonly known as:   DECADRON  Take 1 tablet (4 mg total) by mouth 2 (two) times daily with a meal. For 2 days. Begin day after each chemo treatment.     dexamethasone 4 MG tablet  Commonly known as:  DECADRON  Take 20 mg (5 tablets) once a week.     diclofenac sodium 1 % Gel  Commonly known as:  VOLTAREN  Apply 2 g topically 4 (four) times daily.     glucose blood test strip  Commonly known as:  ONETOUCH VERIO  1 each by Other route as needed for other. Use as instructed     KLOR-CON M20 20 MEQ tablet  Generic drug:  potassium chloride SA  TAKE ONE TABLET BY MOUTH ONCE DAILY     lactulose 10 GM/15ML solution  Commonly known as:  CHRONULAC  Take 15 mLs (10 g total) by mouth 2 (two) times daily as needed.     loratadine 10 MG tablet  Commonly known as:  CLARITIN  TAKE 1 TABLET (10 MG TOTAL) BY MOUTH DAILY.     metFORMIN 500 MG tablet  Commonly known as:  GLUCOPHAGE  TAKE 1 TABLET (500 MG TOTAL) BY MOUTH 2 (TWO) TIMES DAILY WITH A MEAL.     montelukast 10 MG tablet  Commonly known as:  SINGULAIR  Take 1 tablet (10 mg total) by mouth at bedtime. Day before and day of each chemo treatment.     ONETOUCH DELICA LANCETS FINE Misc  1 each by Does not apply route daily.     pantoprazole 40 MG tablet  Commonly known as:  PROTONIX  Take 1 tablet (40 mg total) by mouth 2 (two) times daily.     polyethylene glycol powder powder  Commonly known as:  GLYCOLAX/MIRALAX  TAKE 1-2 DOSES DAILY AS NEEDED     prochlorperazine 10 MG tablet  Commonly known as:  COMPAZINE  TAKE 1 TABLET BY MOUTH EVERY SIX HOURS AS NEEDED     tamsulosin 0.4 MG Caps capsule  Commonly known as:  FLOMAX  TAKE 1 CAPSULE (0.4 MG TOTAL) BY MOUTH 2 (TWO) TIMES DAILY. WITH A MEAL.     traMADol 50 MG tablet  Commonly known as:  ULTRAM  Take 1 tablet (50 mg total) by mouth every 8 (eight) hours as needed.         PHYSICAL EXAMINATION  Oncology Vitals 03/11/2015 03/11/2015 03/11/2015 03/11/2015 03/11/2015 03/11/2015 03/07/2015    Height 183 cm - - 183 cm - - -  Weight 101.152 kg - - (No Data) - - -  Weight (lbs) 223 lbs - - (No Data) - - -  BMI (kg/m2) 30.24 kg/m2 - - - - - -  Temp - - 101.1 100.2 100.3 102.9 97.9  Pulse - 107 112 116 - 110 85  Resp - $Remo'29 20 16 'GCUQi$ - 20 18  Resp (Historical as of 02/10/12) - - - - - - -  SpO2 - 96 94 99 - 99 98  BSA (m2) 2.27 m2 - - - - - -  BP Readings from Last 3 Encounters:  03/11/15 104/48  03/11/15 110/49  03/11/15 102/54    Physical Exam  Constitutional: He is oriented to person, place, and time. He appears unhealthy.  HENT:  Head: Normocephalic and atraumatic.  On exam, it appears that the left lower back molar area has now significantly progressed; with the gum area directly below the tooth now ulcerated with some purulent discharge.  The area remains very tender with any manipulation whatsoever.  Patient was shaking with chills on exam.       Eyes: Conjunctivae and EOM are normal. Pupils are equal, round, and reactive to light. Right eye exhibits no discharge. Left eye exhibits no discharge. No scleral icterus.  Neck: Normal range of motion. Neck supple. No JVD present. No tracheal deviation present. No thyromegaly present.  Cardiovascular: Normal rate, regular rhythm, normal heart sounds and intact distal pulses.   Pulmonary/Chest: Effort normal and breath sounds normal. No respiratory distress. He has no wheezes. He has no rales. He exhibits no tenderness.  Abdominal: Soft. Bowel sounds are normal. He exhibits no distension and no mass. There is no tenderness. There is no rebound and no guarding.  Musculoskeletal: Normal range of motion. He exhibits no edema or tenderness.  Lymphadenopathy:    He has no cervical adenopathy.  Neurological: He is alert and oriented to person, place, and time.  Skin: Skin is warm and dry. No rash noted. No erythema. No pallor.  Psychiatric: Affect normal.  Nursing note and vitals reviewed.   LABORATORY DATA:. Admission on  03/11/2015  Component Date Value Ref Range Status  . Lactic Acid, Venous 03/11/2015 2.38* 0.5 - 2.0 mmol/L Final  . Comment 03/11/2015 NOTIFIED PHYSICIAN   Final  . O2 Content 03/11/2015 2.0   Final  . Delivery systems 03/11/2015 NASAL CANNULA   Final  . pH, Arterial 03/11/2015 7.509* 7.350 - 7.450 Final  . pCO2 arterial 03/11/2015 26.6* 35.0 - 45.0 mmHg Final  . pO2, Arterial 03/11/2015 82.7  80.0 - 100.0 mmHg Final  . Bicarbonate 03/11/2015 20.7  20.0 - 24.0 mEq/L Final  . TCO2 03/11/2015 17.8  0 - 100 mmol/L Final  . Acid-base deficit 03/11/2015 0.3  0.0 - 2.0 mmol/L Final  . O2 Saturation 03/11/2015 95.3   Final  . Patient temperature 03/11/2015 101.1   Final  . Collection site 03/11/2015 LEFT RADIAL   Final  . Drawn by 03/11/2015 [id number]   Final  . Sample type 03/11/2015 ARTERIAL DRAW   Final  . Chauncey Reading test (pass/fail) 03/11/2015 PASS  PASS Final  Orders Only on 03/11/2015  Component Date Value Ref Range Status  . WBC 03/11/2015 0.8* 4.0 - 10.3 10e3/uL Final  . NEUT# 03/11/2015 0.0* 1.5 - 6.5 10e3/uL Final  . HGB 03/11/2015 8.1* 13.0 - 17.1 g/dL Final  . HCT 03/11/2015 23.2* 38.4 - 49.9 % Final  . Platelets 03/11/2015 16* 140 - 400 10e3/uL Final  . MCV 03/11/2015 99.6* 79.3 - 98.0 fL Final  . MCH 03/11/2015 34.7* 27.2 - 33.4 pg Final  . MCHC 03/11/2015 34.9  32.0 - 36.0 g/dL Final  . RBC 03/11/2015 2.33* 4.20 - 5.82 10e6/uL Final  . RDW 03/11/2015 22.4* 11.0 - 14.6 % Final  . lymph# 03/11/2015 0.5* 0.9 - 3.3 10e3/uL Final  . MONO# 03/11/2015 0.2  0.1 - 0.9 10e3/uL Final  . Eosinophils Absolute 03/11/2015 0.0  0.0 - 0.5 10e3/uL Final  . Basophils Absolute 03/11/2015 0.0  0.0 - 0.1 10e3/uL Final  . NEUT% 03/11/2015  5.0* 39.0 - 75.0 % Final  . LYMPH% 03/11/2015 68.7* 14.0 - 49.0 % Final  . MONO% 03/11/2015 22.2* 0.0 - 14.0 % Final  . EOS% 03/11/2015 1.4  0.0 - 7.0 % Final  . BASO% 03/11/2015 2.7* 0.0 - 2.0 % Final  Appointment on 03/11/2015  Component Date Value Ref Range  Status  . Glucose 03/11/2015 Negative  Negative mg/dL Final  . Bilirubin (Urine) 03/11/2015 Negative  Negative Final  . Ketones 03/11/2015 Negative  Negative mg/dL Final  . Specific Gravity, Urine 03/11/2015 1.020  1.003 - 1.035 Final  . Blood 03/11/2015 Negative  Negative Final  . pH 03/11/2015 6.0  4.6 - 8.0 Final  . Protein 03/11/2015 Negative  Negative- <30 mg/dL Final  . Urobilinogen, UR 03/11/2015 0.2  0.2 - 1 mg/dL Final  . Nitrite 03/11/2015 Negative  Negative Final  . Leukocyte Esterase 03/11/2015 Negative  Negative Final  . RBC / HPF 03/11/2015 Negative  0 - 2 Final  . WBC, UA 03/11/2015 0-2  0 - 2 Final  . Bacteria, UA 03/11/2015 Trace  Negative- Trace Final  . Epithelial Cells 03/11/2015 Occasional  Negative- Few Final  . Sodium 03/11/2015 126* 136 - 145 mEq/L Final  . Potassium 03/11/2015 4.4 Sl. Hemolysis  3.5 - 5.1 mEq/L Final  . Chloride 03/11/2015 96* 98 - 109 mEq/L Final  . CO2 03/11/2015 21* 22 - 29 mEq/L Final  . Glucose 03/11/2015 122  70 - 140 mg/dl Final  . BUN 03/11/2015 16.1  7.0 - 26.0 mg/dL Final  . Creatinine 03/11/2015 1.2  0.7 - 1.3 mg/dL Final  . Total Bilirubin 03/11/2015 0.73  0.20 - 1.20 mg/dL Final  . Alkaline Phosphatase 03/11/2015 45  40 - 150 U/L Final  . AST 03/11/2015 22  5 - 34 U/L Final  . ALT 03/11/2015 15  0 - 55 U/L Final  . Total Protein 03/11/2015 8.2  6.4 - 8.3 g/dL Final  . Albumin 03/11/2015 2.6* 3.5 - 5.0 g/dL Final  . Calcium 03/11/2015 8.7  8.4 - 10.4 mg/dL Final  . Anion Gap 03/11/2015 10  3 - 11 mEq/L Final  . EGFR 03/11/2015 68* >90 ml/min/1.73 m2 Final   eGFR is calculated using the CKD-EPI Creatinine Equation (2009)  Appointment on 03/10/2015  Component Date Value Ref Range Status  . Hold Tube, Blood Bank 03/10/2015 Type and Crossmatch Added   Final  Appointment on 03/10/2015  Component Date Value Ref Range Status  . WBC 03/10/2015 1.3* 4.0 - 10.3 10e3/uL Final  . NEUT# 03/10/2015 0.1* 1.5 - 6.5 10e3/uL Final  . HGB  03/10/2015 7.9* 13.0 - 17.1 g/dL Final  . HCT 03/10/2015 21.7* 38.4 - 49.9 % Final  . Platelets 03/10/2015 16* 140 - 400 10e3/uL Final  . MCV 03/10/2015 92.7  79.3 - 98.0 fL Final  . MCH 03/10/2015 33.8* 27.2 - 33.4 pg Final  . MCHC 03/10/2015 36.4* 32.0 - 36.0 g/dL Final  . RBC 03/10/2015 2.34* 4.20 - 5.82 10e6/uL Final  . RDW 03/10/2015 21.8* 11.0 - 14.6 % Final  . lymph# 03/10/2015 1.1  0.9 - 3.3 10e3/uL Final  . MONO# 03/10/2015 0.1  0.1 - 0.9 10e3/uL Final  . Eosinophils Absolute 03/10/2015 0.0  0.0 - 0.5 10e3/uL Final  . Basophils Absolute 03/10/2015 0.0  0.0 - 0.1 10e3/uL Final  . NEUT% 03/10/2015 6.3* 39.0 - 75.0 % Final  . LYMPH% 03/10/2015 85.9* 14.0 - 49.0 % Final  . MONO% 03/10/2015 7.8  0.0 - 14.0 % Final  .  EOS% 03/10/2015 0.0  0.0 - 7.0 % Final  . BASO% 03/10/2015 0.0  0.0 - 2.0 % Final  . nRBC 03/10/2015 0  0 - 0 % Final  . Technologist Review 03/10/2015 2% Blast   Final     RADIOGRAPHIC STUDIES: Dg Chest 2 View  03/11/2015   CLINICAL DATA:  Fever with weakness. Possible sepsis from tooth infection. Multiple myeloma. Initial encounter.  EXAM: CHEST  2 VIEW  COMPARISON:  02/28/2015 and 11/21/2014 radiographs.  FINDINGS: The heart size and mediastinal contours are stable. There are slightly lower lung volumes. On today's lateral view, there is increased density in the retrosternal clear space which may correspond with faint lingular opacity on the frontal examination. There is no consolidation or significant pleural effusion. Telemetry leads overlie the chest. Previous lower cervical fusion noted. There are lucencies in the proximal right humerus attributed to the patient's multiple myeloma.  IMPRESSION: Possible developing lingular pneumonia.   Electronically Signed   By: Richardean Sale M.D.   On: 03/11/2015 15:42    ASSESSMENT/PLAN:    Dental infection Patient is complaining of left lower back molar pain and inflammation for approximately one week.  Patient was seen last  week for the same complaint; and was advised to continue taking Cipro antibiotics.  Patient was evaluated per Dr. Baltazar Najjar oral surgeon just yesterday 03/10/2015; and was advised that he did indeed have a dental infection.  Due to patient's significantly low blood count-Dr. Derenda Mis advised patient he would have to hold on any dental extraction or other oral surgery.  Patient was given a new antibiotic and oral rinse agent yesterday; but patient is unclear of the actual name of this antibiotic.  Patient reports increased dental pain and fever to maximum of 103.0 earlier this morning.  He is also complaining of significant chills as well.  On exam, it appears that the left lower back molar area has now significantly progressed; with the gum area directly below the tooth now ulcerated with some purulent discharge.  The area remains very tender with any manipulation whatsoever.  Patient was shaking with chills on exam.  Vital signs while at the [place] revealed blood pressure 110/49, heart rate 116, and temperature 100.2.  Despite multiple attempts; was unable to obtain blood culture while at the cancer center.  After speaking with hospitalist, Dr.Gherghe- decision was made to transport patient to the emergency department for further evaluation and management of dental infection, neutropenic fever, and possible impending sepsis.  Brief history report were called to the emergency department charge nurse prior to patient being transported to the emergency department via wheelchair per the [place].  Neutropenic fever Patient is complaining of left lower back molar pain and inflammation for approximately one week.  Patient was seen last week for the same complaint; and was advised to continue taking Cipro antibiotics.  Patient was evaluated per Dr. Baltazar Najjar oral surgeon just yesterday 03/10/2015; and was advised that he did indeed have a dental infection.  Due to patient's  significantly low blood count-Dr. Derenda Mis advised patient he would have to hold on any dental extraction or other oral surgery.  Patient was given a new antibiotic and oral rinse agent yesterday; but patient is unclear of the actual name of this antibiotic.  Patient reports increased dental pain and fever to maximum of 103.0 earlier this morning.  He is also complaining of significant chills as well.  On exam, it appears that the left lower back molar area  has now significantly progressed; with the gum area directly below the tooth now ulcerated with some purulent discharge.  The area remains very tender with any manipulation whatsoever.  Patient was shaking with chills on exam.  Vital signs while at the [place] revealed blood pressure 110/49, heart rate 116, and temperature 100.2.  Blood counts obtained today reveal a WBC of 0.8, ANC 0.0, hemoglobin 8.1, platelet count 16.  Patient received platelets on 03/07/2015; and received blood transfusion just yesterday 03/11/2015.  Despite multiple attempts; was unable to obtain blood culture while at the cancer center.  After speaking with hospitalist, Dr.Gherghe- decision was made to transport patient to the emergency department for further evaluation and management of dental infection, neutropenic fever, and possible impending sepsis.  Hospitalist recommended that sepsis protocol be initiated once patient arrives to the emergency department.  Brief history report were called to the emergency department charge nurse prior to patient being transported to the emergency department via wheelchair per the [place].  Emergency department charge nurse is aware that the [place], was unable to obtain blood culture sample.  Multiple myeloma Patient recieved his 3rd cycle of daratumumab chemotherapy on 03/07/2015.  Patient reports progressive left lower back molar dental infection; with fever to maximum 103.0 earlier this morning.  Blood  counts obtained today reveal a WBC of 0.8, ANC 0.0, hemoglobin 8.1, and platelet count 16.  Patient received platelet transfusion on Friday, 03/07/2015.  Patient received blood transfusion just yesterday 03/10/2015.  Due to progressive dental infection, neutropenic fever, and possible impending sepsis-patient was transported to the emergency department for further evaluation and management this afternoon.  Patient was scheduled for Port-A-Cath placement today; but this will have to be rescheduled once patient has recovered.  Patient is scheduled for labs, visit, his next cycle of chemotherapy on 03/14/2015.     Patient stated understanding of all instructions; and was in agreement with this plan of care. The patient knows to call the clinic with any problems, questions or concerns.   This was a shared visit with Dr. Benay Spice today.  Total time spent with patient was 40 minutes;  with greater than 75 percent of that time spent in face to face counseling regarding patient's symptoms,  and coordination of care and follow up.  Disclaimer:This dictation was prepared with Dragon/digital dictation along with [employer]. Any transcriptional errors that result from this process are unintentional.  Drue Second, NP 03/11/2015   This was a shared visit with Drue Second. Mr. Stille was interviewed and examined. He presents with a high fever and severe pancytopenia. The source of the fever is most likely an infected left lower tooth.  The pancytopenia secondary to multiple myeloma, myelodysplasia, and recent treatment with daratumumab.  Recommendations: 1. Obtain blood and urine cultures, chest x-ray, and begin broad-spectrum intravenous antibiotics 2. Dental medicine consult 3. Begin G-CSF 4. Transfuse platelets for a count of less than 10,000, bleeding, or if needed prior to a procedure  Mr. Demelo has an overall poor prognosis. We will consider moving toward comfort/Hospice care  pending his recovery from the acute infection.  Julieanne Manson, M.D.

## 2015-03-11 NOTE — Progress Notes (Signed)
Patient pre blood temperature 102.9 orally. MD notified. Instructed to send patient over to cancer center. Patient transported in wheelchair by staff.

## 2015-03-12 ENCOUNTER — Inpatient Hospital Stay (HOSPITAL_COMMUNITY): Payer: Medicare HMO

## 2015-03-12 DIAGNOSIS — D469 Myelodysplastic syndrome, unspecified: Secondary | ICD-10-CM

## 2015-03-12 DIAGNOSIS — R52 Pain, unspecified: Secondary | ICD-10-CM

## 2015-03-12 DIAGNOSIS — D61818 Other pancytopenia: Secondary | ICD-10-CM

## 2015-03-12 DIAGNOSIS — D709 Neutropenia, unspecified: Secondary | ICD-10-CM

## 2015-03-12 DIAGNOSIS — C9000 Multiple myeloma not having achieved remission: Secondary | ICD-10-CM

## 2015-03-12 DIAGNOSIS — K047 Periapical abscess without sinus: Secondary | ICD-10-CM

## 2015-03-12 DIAGNOSIS — M542 Cervicalgia: Secondary | ICD-10-CM

## 2015-03-12 DIAGNOSIS — J984 Other disorders of lung: Secondary | ICD-10-CM

## 2015-03-12 LAB — BASIC METABOLIC PANEL
Anion gap: 6 (ref 5–15)
BUN: 12 mg/dL (ref 6–20)
CHLORIDE: 100 mmol/L — AB (ref 101–111)
CO2: 21 mmol/L — AB (ref 22–32)
CREATININE: 1.03 mg/dL (ref 0.61–1.24)
Calcium: 7.5 mg/dL — ABNORMAL LOW (ref 8.9–10.3)
GFR calc Af Amer: >60 mL/min (ref >60)
GFR calc non Af Amer: >60 mL/min (ref >60)
Glucose, Bld: 101 mg/dL — ABNORMAL HIGH (ref 65–99)
POTASSIUM: 3.9 mmol/L (ref 3.5–5.1)
Sodium: 127 mmol/L — ABNORMAL LOW (ref 135–145)

## 2015-03-12 LAB — GLUCOSE, CAPILLARY
GLUCOSE-CAPILLARY: 105 mg/dL — AB (ref 65–99)
GLUCOSE-CAPILLARY: 96 mg/dL (ref 65–99)
GLUCOSE-CAPILLARY: 90 mg/dL (ref 65–99)
GLUCOSE-CAPILLARY: 94 mg/dL (ref 65–99)
Glucose-Capillary: 98 mg/dL (ref 65–99)

## 2015-03-12 LAB — LACTIC ACID, PLASMA: Lactic Acid, Venous: 1.1 mmol/L (ref 0.5–2.0)

## 2015-03-12 LAB — CBC WITH DIFFERENTIAL/PLATELET
BAND NEUTROPHILS: 0 % (ref 0–10)
BLASTS: 28 %
Basophils Absolute: 0 10*3/uL (ref 0.0–0.1)
Basophils Relative: 0 % (ref 0–1)
Eosinophils Absolute: 0 10*3/uL (ref 0.0–0.7)
Eosinophils Relative: 0 % (ref 0–5)
HEMATOCRIT: 18.4 % — AB (ref 39.0–52.0)
HEMOGLOBIN: 6.8 g/dL — AB (ref 13.0–17.0)
LYMPHS PCT: 52 % — AB (ref 12–46)
Lymphs Abs: 0.6 10*3/uL — ABNORMAL LOW (ref 0.7–4.0)
MCH: 34.2 pg — AB (ref 26.0–34.0)
MCHC: 37.4 g/dL — AB (ref 30.0–36.0)
MCV: 92 fL (ref 78.0–100.0)
MONOS PCT: 6 % (ref 3–12)
Metamyelocytes Relative: 0 %
Monocytes Absolute: 0.1 10*3/uL (ref 0.1–1.0)
Myelocytes: 0 %
NEUTROS ABS: 0.2 10*3/uL — AB (ref 1.7–7.7)
NEUTROS PCT: 14 % — AB (ref 43–77)
NRBC: 0 /100{WBCs}
OTHER: 0 %
PROMYELOCYTES ABS: 0 %
Platelets: 9 10*3/uL — CL (ref 150–400)
RBC: 2 MIL/uL — AB (ref 4.22–5.81)
RDW: 21.1 % — ABNORMAL HIGH (ref 11.5–15.5)
WBC: 1.2 10*3/uL — AB (ref 4.0–10.5)

## 2015-03-12 LAB — STREP PNEUMONIAE URINARY ANTIGEN: STREP PNEUMO URINARY ANTIGEN: NEGATIVE

## 2015-03-12 LAB — LEGIONELLA ANTIGEN, URINE

## 2015-03-12 LAB — APTT: aPTT: 33 seconds (ref 24–37)

## 2015-03-12 LAB — URINE CULTURE: Culture: 3000

## 2015-03-12 LAB — PROTIME-INR
INR: 1.49 (ref 0.00–1.49)
Prothrombin Time: 18.1 seconds — ABNORMAL HIGH (ref 11.6–15.2)

## 2015-03-12 LAB — D-DIMER, QUANTITATIVE: D-Dimer, Quant: 8.54 ug/mL-FEU — ABNORMAL HIGH (ref 0.00–0.48)

## 2015-03-12 LAB — FIBRINOGEN: FIBRINOGEN: 560 mg/dL — AB (ref 204–475)

## 2015-03-12 LAB — PREPARE RBC (CROSSMATCH)

## 2015-03-12 MED ORDER — VITAMINS A & D EX OINT
TOPICAL_OINTMENT | CUTANEOUS | Status: AC
  Administered 2015-03-12: 20:00:00
  Filled 2015-03-12: qty 5

## 2015-03-12 MED ORDER — TECHNETIUM TC 99M DIETHYLENETRIAME-PENTAACETIC ACID: 42.3000 | Freq: Once | INTRAVENOUS | Status: DC | PRN

## 2015-03-12 MED ORDER — SODIUM CHLORIDE 0.9 % IJ SOLN
10.0000 mL | Freq: Two times a day (BID) | INTRAMUSCULAR | Status: DC
Start: 2015-03-12 — End: 2015-03-21
  Administered 2015-03-12 (×2): 10 mL
  Administered 2015-03-13: 20 mL
  Administered 2015-03-13 – 2015-03-21 (×8): 10 mL

## 2015-03-12 MED ORDER — SODIUM CHLORIDE 0.9 % IV SOLN
Freq: Once | INTRAVENOUS | Status: AC
  Administered 2015-03-12: 18:00:00 via INTRAVENOUS

## 2015-03-12 MED ORDER — CHLORHEXIDINE GLUCONATE 0.12 % MT SOLN
15.0000 mL | Freq: Two times a day (BID) | OROMUCOSAL | Status: DC
  Administered 2015-03-12 – 2015-03-13 (×2): 15 mL via OROMUCOSAL
  Filled 2015-03-12 (×2): qty 15

## 2015-03-12 MED ORDER — SODIUM CHLORIDE 0.9 % IV SOLN
Freq: Once | INTRAVENOUS | Status: AC
  Administered 2015-03-12: 16:00:00 via INTRAVENOUS

## 2015-03-12 MED ORDER — SODIUM CHLORIDE 0.9 % IJ SOLN: 10.0000 mL | INTRAMUSCULAR | Status: DC | PRN

## 2015-03-12 MED ORDER — CETYLPYRIDINIUM CHLORIDE 0.05 % MT LIQD
7.0000 mL | Freq: Two times a day (BID) | OROMUCOSAL | Status: DC
  Administered 2015-03-12 – 2015-03-13 (×4): 7 mL via OROMUCOSAL

## 2015-03-12 MED ORDER — TECHNETIUM TO 99M ALBUMIN AGGREGATED
5.4000 | Freq: Once | INTRAVENOUS | Status: AC | PRN
  Administered 2015-03-12: 5.4 via INTRAVENOUS

## 2015-03-12 NOTE — Progress Notes (Signed)
New rx for cpap acknowledged.  Per pt, he wears nasal pillows with his home cpap unit.  Pt stated he has tried other masks before and was uncomfortable with them.  Pt prefers to hold off on cpap for now until his wife can bring his nasal pillows in from home.  RN notified.

## 2015-03-12 NOTE — Progress Notes (Signed)
Shift event note:  Multiple notifications from RN regarding lack of access and inability to get labs drawn. Initially pt had a small single PIV through which he had rec'd mx boluses and medications. He lost 2-3 previous PIV accesses. RRT had been ask to obtain labs. RN then reported pt lost his only PIV and IV team was unable to re-establish. Saw pt at bedside and discussed need for better IV access. Pt was willing to have CVL placed. Discussed w/ Dr Vaughan Browner w/ PCCM at approx 0030 who related high acuity on [facility] and requested we attempt to see if we could get assistance from anesthesia. Spoke w/ "Dianna", CRNA who related she was unable to place CVL and MD was not in house. Relayed this info to Dr Vaughan Browner who agreed to send CCM provider as soon as one was available to assess pt for CVL placement.  Assessment/Plan: 1. IV access:  P. [place], NP w/ CCM assessed pt for CVL placement. Given severe thrombocytopenia he elected not to place CVL until pt can be transfused. Recommended PICC placement today given recent difficulty in getting port-a-cath placed. He has placed a 20 G (R) EJ for temporary access.  2. Elevated D-Dimer: (8.54) Unable to obtain CTA d/t lack of appropriate access. An order has been placed for VQ scan. Discussed urgent need w/ radiologist (Dr Alroy Dust) who agreed to facilitate pt getting VQ scan first thing this am. No IV Heparin given thrombocytopenia. Will continue to monitor closely in SDU.   Jeryl Columbia, NP-C [facility] Pager [phone]

## 2015-03-12 NOTE — Progress Notes (Signed)
ANTIBIOTIC CONSULT NOTE - Follow up  Pharmacy Consult for Vancomycin and Aztreonam Indication: Febrile neutropenia  Allergies  Allergen Reactions  . Penicillins Hives and Rash    Patient Measurements: Height: 6' (182.9 cm) Weight: 223 lb (101.152 kg) IBW/kg (Calculated) : 77.6 Adjusted Body Weight:   Vital Signs: Temp: 99.3 F (37.4 C) (08/31 0400) Temp Source: Oral (08/31 0400) BP: 103/53 mmHg (08/31 0604) Pulse Rate: 105 (08/31 0604) Intake/Output from previous day: 08/30 0701 - 08/31 0700 In: 2611.3 [I.V.:361.3; IV Piggyback:2250] Out: 1425 [Urine:1425] Intake/Output from this shift: Total I/O In: 125 [I.V.:125] Out: 200 [Urine:200]  Labs:  Recent Labs  03/10/15 0836 03/11/15 1145 03/11/15 1145 03/12/15 0912  WBC 1.3* 0.8*  --  1.2*  HGB 7.9* 8.1*  --  6.8*  PLT 16* 16*  --  9*  CREATININE  --   --  1.2 1.03   Estimated Creatinine Clearance: 73.9 mL/min (by C-G formula based on Cr of 1.03). No results for input(s): VANCOTROUGH, VANCOPEAK, VANCORANDOM, GENTTROUGH, GENTPEAK, GENTRANDOM, TOBRATROUGH, TOBRAPEAK, TOBRARND, AMIKACINPEAK, AMIKACINTROU, AMIKACIN in the last 72 hours.   Microbiology: Recent Results (from the past 720 hour(s))  TECHNOLOGIST REVIEW     Status: None   Collection Time: 02/14/15  9:06 AM  Result Value Ref Range Status   Technologist Review mod targets  Final  TECHNOLOGIST REVIEW     Status: None   Collection Time: 03/10/15  8:36 AM  Result Value Ref Range Status   Technologist Review 2% Blast  Final  MRSA PCR Screening     Status: None   Collection Time: 03/11/15  6:28 PM  Result Value Ref Range Status   MRSA by PCR NEGATIVE NEGATIVE Final    Comment:        The GeneXpert MRSA Assay (FDA approved for NASAL specimens only), is one component of a comprehensive MRSA colonization surveillance program. It is not intended to diagnose MRSA infection nor to guide or monitor treatment for MRSA infections.     Medical  History: Past Medical History  Diagnosis Date  . GERD (gastroesophageal reflux disease)   . Essential hypertension, benign   . BPH (benign prostatic hyperplasia)   . Lumbar herniated disc     L4-5  . Hyperlipidemia   . Sleep apnea   . Multiple myeloma 02/19/2008  . Arthritis   . Diverticulosis   . Peptic ulcer disease   . Tubular adenoma 02/16/2008    Dr. Silvano Rusk  . MDS (myelodysplastic syndrome) 06/17/2014  . Diabetes mellitus without complication     Medications:  Anti-infectives    Start     Dose/Rate Route Frequency Ordered Stop   03/12/15 0600  vancomycin (VANCOCIN) IVPB 1000 mg/200 mL premix     1,000 mg 200 mL/hr over 60 Minutes Intravenous Every 12 hours 03/11/15 1529     03/11/15 1600  aztreonam (AZACTAM) 2 g in dextrose 5 % 50 mL IVPB     2 g 100 mL/hr over 30 Minutes Intravenous 3 times per day 03/11/15 1529     03/11/15 1600  vancomycin (VANCOCIN) 1,500 mg in sodium chloride 0.9 % 500 mL IVPB     1,500 mg 250 mL/hr over 120 Minutes Intravenous  Once 03/11/15 1529 03/11/15 1826     Assessment: 79yo M from the [facility] with suspected early sepsis, febrile, tachycardic, and neutropenic. Possible tooth infection although the patient reports this feels better after recent antibiotics. He has been on Cipro since 02/21/15. Pharmacy is asked to dose Vancomycin  and Aztreonam (penicillin allergic) for febrile neutropenia, sepsis likely secondary to HCAP.  8/30 >> Vanc >> 8/30 >> Aztreonam >>   Tmax: 103 ANC 0.2, Granix 8/30 >> SCR 1.03, CrCl~60 ml/min (normalized)  8/30 blood: sent 8/30 urine: sent  Dose changes/levels: 9/1 0500 VT: ____  Goal of Therapy:  Vancomycin trough level 15-20 mcg/ml  Appropriate antibiotic dosing for renal function; eradication of infection  Plan:  Continue Vancomycin 1g IV q12h and check vancomycin trough tomorrow morning. Continue Aztreonam 2g IV q8h. Follow up renal fxn, culture results, and clinical course.  Hershal Coria, PharmD Pager: [phone] 03/12/2015

## 2015-03-12 NOTE — Progress Notes (Signed)
RN asked RT to place PT on home CPAP- RT addressed room and PT is currently undergoing a sterile procedure.

## 2015-03-12 NOTE — Progress Notes (Signed)
IP PROGRESS NOTE  Subjective:   [name] was admitted yesterday with a high fever and severe neutropenia. He complains of right sided back pain and chills this morning.  Objective: Vital signs in last 24 hours: Blood pressure 103/53, pulse 105, temperature 103.1 F (39.5 C), temperature source Oral, resp. rate 24, height 6' (1.829 m), weight 223 lb (101.152 kg), SpO2 96 %.  Intake/Output from previous day: 08/30 0701 - 08/31 0700 In: 2611.3 [I.V.:361.3; IV Piggyback:2250] Out: 4098 [Urine:1425]  Physical Exam:  HEENT: No thrush, no bleeding Lungs: Clear anteriorly Cardiac: Regular rate and rhythm Abdomen: No hepatomegaly, nontender Extremities: No leg edema Neurologic: He moves both legs, alert, follows commands   Lab Results:  Recent Labs  03/11/15 1145 03/12/15 0912  WBC 0.8* 1.2*  HGB 8.1* 6.8*  HCT 23.2* 18.4*  PLT 16* 9*    BMET  Recent Labs  03/11/15 1145 03/12/15 0912  NA 126* 127*  K 4.4 Sl. Hemolysis 3.9  CL  --  100*  CO2 21* 21*  GLUCOSE 122 101*  BUN 16.1 12  CREATININE 1.2 1.03  CALCIUM 8.7 7.5*    Studies/Results: Dg Chest 2 View  03/11/2015   CLINICAL DATA:  Fever with weakness. Possible sepsis from tooth infection. Multiple myeloma. Initial encounter.  EXAM: CHEST  2 VIEW  COMPARISON:  02/28/2015 and 11/21/2014 radiographs.  FINDINGS: The heart size and mediastinal contours are stable. There are slightly lower lung volumes. On today's lateral view, there is increased density in the retrosternal clear space which may correspond with faint lingular opacity on the frontal examination. There is no consolidation or significant pleural effusion. Telemetry leads overlie the chest. Previous lower cervical fusion noted. There are lucencies in the proximal right humerus attributed to the patient's multiple myeloma.  IMPRESSION: Possible developing lingular pneumonia.   Electronically Signed   By: Richardean Sale M.D.   On: 03/11/2015 15:42     Medications: I have reviewed the patient's current medications.  Assessment/Plan:  1. Multiple myeloma-currently being treated with salvage Daratumumab/Decadron therapy, last treated 03/07/2015  2. Myelodysplasia-treated with 5 azacytidine for progressive pancytopenia, last given June 2016  3. Severe pancytopenia secondary to multiple myeloma, myelodysplasia, and daratumumab  4. Fever-likely related to an infected tooth, possible left lung pneumonia noted on an admission chest x-ray 03/11/2015  5. Pain/tenderness and inflammation at a left lower molar  6.  Chronic lung disease  7.  Poor peripheral IV access  [name] continues to have a high fever in the setting of severe neutropenia. The left lower tooth may be the source for infection. He has persistent severe pancytopenia.  Recommendations: 1. Continue broad-spectrum intravenous antibiotics and follow-up cultures 2. Urgent dental medicine evaluation to evaluate the inflamed tooth 3. Placement of PICC for IV access 4. Transfuse platelets and packed red blood cells 5. Continue G-CSF  I appreciate the care from the internal medicine service. Hematology continue following [name] daily.   LOS: 1 day   [name]  03/12/2015, 11:25 AM

## 2015-03-12 NOTE — Progress Notes (Signed)
PCCM INTERVAL PROGRESS NOTE  Called to evaluate patient for CVL. Apparently patient had lost all IV sites and despite multiple attempts including IV team, they have not been able to gain vascular access. Upon my arrival patient had no IV's. In review of his medical record he has severe thrombocytopenia with platelets 16. For temporary access I placed Right external jugular IV. 20 gauge, good blood return and flushed easily. No complications. Agree needs more permanent access. PICC probably most appropriate based on long term need for access, and difficulty having port-a-cath placed. Would need platelet transfusion prior to any such elective procedure.   Georgann Housekeeper, AGACNP-BC [facility] Pulmonology/Critical Care Pager [phone] or [phone]  03/12/2015 4:29 AM

## 2015-03-12 NOTE — Progress Notes (Addendum)
CRITICAL VALUE ALERT  Critical value received:  Platelets 9,Hemoglobin 6.8, WBC 1.2  Date of notification:  03/12/15  Time of notification:  0900  Critical value read back: yes  Nurse who received alert:  Mannie Stabile RN  MD notified (1st page):  Dr. Benay Spice  Time of first page:  0915  MD notified (2nd page):  Time of second page:  Responding MD:  same  Time MD responded:

## 2015-03-12 NOTE — Progress Notes (Signed)
Radiology no longer has a Interior and spatial designer for this test. Radiologist suggested a CT Maxillofacial W/O CM. Order was placed for the alternative test.

## 2015-03-12 NOTE — Progress Notes (Signed)
[facility] PROGRESS NOTE  [name] BFR:[id number] DOB: [birth year] DOA: 03/11/2015 PCP: Junie Spencer, FNP  Assessment/Plan: 1-Neutropenic fever/SEPSIS: Likely secondary to HCAP from ongoing chemotherapy. Lactic acid trending down.  Continue with IV fluids, IV antibiotics.  Granix ordered.  PICC  line ordered, as recommended by Dr Myrle Sheng. No need for platelet transfuse for picc line unless platelet below 10,000.   D-dimer ; awaiting V-Q scan.   Dental infection: Remaining first left mandibular molar very loose with significant surrounding erythema but no appreciable fluctuance.  - antibiotics as above - Follow-up dental recommendations -Dr Kristin Bruins consulted. Will need to follow   Hyponatremia: 126 on admission. Previous 128 on 8/26 and 131 on 8/22.  - IVF NS -follow labs.   Multiple Myeloma: ongoing treatment. Last chemo 5 days ago. All hematologic cell lines down. - continue Decadron Qwkly (Friday) - Dr Truett Perna following.    DM: Will hold  invokana and metformin - A1c - SSI  Chronic diastolic CHF: Last echo showing EF of 55-60% grade 1 diastolic dysfunction. Monitor for pulmonary edema, continue with IV fluids.   ANxiety/insomnia: - continue Klonopin  BPH: - continue flomax  Code Status: Full Code.  Family Communication: Care discussed with patient.  Disposition Plan: Remain in the step down unit.    Consultants:  Dr Myrle Sheng  Procedures:  Right external Jugular.   Antibiotics:  Vancomycin 8-30  Aztreonam 8-30  HPI/Subjective: He is alert, tooth pain is ok. Denies diarrhea.  Relates mild dyspnea.   Objective: Filed Vitals:   03/12/15 0604  BP: 103/53  Pulse: 105  Temp:   Resp: 24    Intake/Output Summary (Last 24 hours) at 03/12/15 0818 Last data filed at 03/12/15 0700  Gross per 24 hour  Intake 2611.34 ml  Output   1425 ml  Net 1186.34 ml   Filed Weights   03/11/15 1635  Weight: 101.152 kg (223 lb)     Exam:   General:  Alert in no distress.  Cardiovascular: S 1, S 2 RRR  Respiratory: crackles at bases.   Abdomen: Bs present, soft, nt  Musculoskeletal: no edema  Data Reviewed: Basic Metabolic Panel:  Recent Labs Lab 03/07/15 0926 03/11/15 1145  NA 128* 126*  K 4.6 4.4 Sl. Hemolysis  CO2 23 21*  GLUCOSE 137 122  BUN 14.4 16.1  CREATININE 1.1 1.2  CALCIUM 8.6 8.7   Liver Function Tests:  Recent Labs Lab 03/07/15 0926 03/11/15 1145  AST 14 22  ALT 12 15  ALKPHOS 45 45  BILITOT 0.65 0.73  PROT 8.2 8.2  ALBUMIN 2.7* 2.6*   No results for input(s): LIPASE, AMYLASE in the last 168 hours. No results for input(s): AMMONIA in the last 168 hours. CBC:  Recent Labs Lab 03/07/15 0926 03/10/15 0836 03/11/15 1145  WBC 0.6* 1.3* 0.8*  NEUTROABS 0.1* 0.1* 0.0*  HGB 9.1* 7.9* 8.1*  HCT 25.8* 21.7* 23.2*  MCV 99.0* 92.7 99.6*  PLT 8* 16* 16*   Cardiac Enzymes: No results for input(s): CKTOTAL, CKMB, CKMBINDEX, TROPONINI in the last 168 hours. BNP (last 3 results) No results for input(s): BNP in the last 8760 hours.  ProBNP (last 3 results) No results for input(s): PROBNP in the last 8760 hours.  CBG:  Recent Labs Lab 03/11/15 2151  GLUCAP 98    Recent Results (from the past 240 hour(s))  TECHNOLOGIST REVIEW     Status: None   Collection Time: 03/10/15  8:36 AM  Result Value Ref Range Status  Technologist Review 2% Blast  Final  MRSA PCR Screening     Status: None   Collection Time: 03/11/15  6:28 PM  Result Value Ref Range Status   MRSA by PCR NEGATIVE NEGATIVE Final    Comment:        The GeneXpert MRSA Assay (FDA approved for NASAL specimens only), is one component of a comprehensive MRSA colonization surveillance program. It is not intended to diagnose MRSA infection nor to guide or monitor treatment for MRSA infections.      Studies: Dg Chest 2 View  03/11/2015   CLINICAL DATA:  Fever with weakness. Possible sepsis from tooth  infection. Multiple myeloma. Initial encounter.  EXAM: CHEST  2 VIEW  COMPARISON:  02/28/2015 and 11/21/2014 radiographs.  FINDINGS: The heart size and mediastinal contours are stable. There are slightly lower lung volumes. On today's lateral view, there is increased density in the retrosternal clear space which may correspond with faint lingular opacity on the frontal examination. There is no consolidation or significant pleural effusion. Telemetry leads overlie the chest. Previous lower cervical fusion noted. There are lucencies in the proximal right humerus attributed to the patient's multiple myeloma.  IMPRESSION: Possible developing lingular pneumonia.   Electronically Signed   By: Richardean Sale M.D.   On: 03/11/2015 15:42    Scheduled Meds: . antiseptic oral rinse  7 mL Mouth Rinse q12n4p  . atorvastatin  20 mg Oral q1800  . aztreonam  2 g Intravenous 3 times per day  . chlorhexidine  15 mL Mouth Rinse BID  . [START ON 03/14/2015] dexamethasone  20 mg Oral Q Fri  . diclofenac sodium  2 g Topical QID  . insulin aspart  0-15 Units Subcutaneous TID WC  . insulin aspart  0-5 Units Subcutaneous QHS  . pantoprazole  40 mg Oral BID  . potassium chloride SA  20 mEq Oral Daily  . sodium chloride  3 mL Intravenous Q12H  . tamsulosin  0.4 mg Oral BID PC  . Tbo-Filgrastim  480 mcg Subcutaneous Q24H  . vancomycin  1,000 mg Intravenous Q12H   Continuous Infusions: . sodium chloride 125 mL/hr at 03/12/15 0455    Principal Problem:   Sepsis Active Problems:   GERD (gastroesophageal reflux disease)   Neutropenic fever   Chronic diastolic congestive heart failure   Hyponatremia   Dental infection   Diabetes mellitus with complication   Anxiety state   Thrombocytopenia    Time spent: 35 minutes.     Niel Hummer A  [facility] Pager [phone]. If 7PM-7AM, please contact night-coverage at [email or web address], password [facility] 03/12/2015, 8:18 AM  LOS: 1 day

## 2015-03-12 NOTE — Progress Notes (Signed)
Peripherally Inserted Central Catheter/Midline Placement  The IV Nurse has discussed with the patient and/or persons authorized to consent for the patient, the purpose of this procedure and the potential benefits and risks involved with this procedure.  The benefits include less needle sticks, lab draws from the catheter and patient may be discharged home with the catheter.  Risks include, but not limited to, infection, bleeding, blood clot (thrombus formation), and puncture of an artery; nerve damage and irregular heat beat.  Alternatives to this procedure were also discussed.  PICC/Midline Placement Documentation  PICC / Midline Double Lumen 53/20/23 PICC Left Basilic 48 cm 1 cm (Active)  Indication for Insertion or Continuance of Line Poor Vasculature-patient has had multiple peripheral attempts or PIVs lasting less than 24 hours 03/12/2015  2:00 PM  Exposed Catheter (cm) 1 cm 03/12/2015  2:00 PM  Dressing Change Due 03/19/15 03/12/2015  2:00 PM       Jule Economy Horton 03/12/2015, 3:00 PM

## 2015-03-12 NOTE — Progress Notes (Signed)
Pt already on CPAP at this time via home Nasal mask.  Current CPAP settings are 9 CMH20 with 3 LPM O2 bleed in.  Pt is tolerating well at this time, RT to monitor and assess as needed.

## 2015-03-13 ENCOUNTER — Inpatient Hospital Stay (HOSPITAL_COMMUNITY): Payer: Medicare HMO

## 2015-03-13 ENCOUNTER — Encounter (HOSPITAL_COMMUNITY): Payer: Self-pay | Admitting: Dentistry

## 2015-03-13 DIAGNOSIS — C9000 Multiple myeloma not having achieved remission: Secondary | ICD-10-CM

## 2015-03-13 DIAGNOSIS — R609 Edema, unspecified: Secondary | ICD-10-CM

## 2015-03-13 DIAGNOSIS — I82401 Acute embolism and thrombosis of unspecified deep veins of right lower extremity: Secondary | ICD-10-CM

## 2015-03-13 LAB — CBC WITH DIFFERENTIAL/PLATELET
BAND NEUTROPHILS: 0 % (ref 0–10)
BASOS ABS: 0 10*3/uL (ref 0.0–0.1)
BLASTS: 35 %
Basophils Relative: 0 % (ref 0–1)
EOS ABS: 0 10*3/uL (ref 0.0–0.7)
Eosinophils Relative: 0 % (ref 0–5)
HCT: 23.3 % — ABNORMAL LOW (ref 39.0–52.0)
HEMOGLOBIN: 8.5 g/dL — AB (ref 13.0–17.0)
LYMPHS PCT: 38 % (ref 12–46)
Lymphs Abs: 0.6 10*3/uL — ABNORMAL LOW (ref 0.7–4.0)
MCH: 32.3 pg (ref 26.0–34.0)
MCHC: 36.5 g/dL — ABNORMAL HIGH (ref 30.0–36.0)
MCV: 88.6 fL (ref 78.0–100.0)
METAMYELOCYTES PCT: 0 %
Monocytes Absolute: 0.2 10*3/uL (ref 0.1–1.0)
Monocytes Relative: 13 % — ABNORMAL HIGH (ref 3–12)
Myelocytes: 0 %
Neutro Abs: 0.2 10*3/uL — ABNORMAL LOW (ref 1.7–7.7)
Neutrophils Relative %: 14 % — ABNORMAL LOW (ref 43–77)
Other: 0 %
PROMYELOCYTES ABS: 0 %
Platelets: 44 10*3/uL — ABNORMAL LOW (ref 150–400)
RBC: 2.63 MIL/uL — AB (ref 4.22–5.81)
RDW: 19.2 % — ABNORMAL HIGH (ref 11.5–15.5)
WBC: 1.6 10*3/uL — AB (ref 4.0–10.5)
nRBC: 0 /100 WBC

## 2015-03-13 LAB — GLUCOSE, CAPILLARY
GLUCOSE-CAPILLARY: 105 mg/dL — AB (ref 65–99)
GLUCOSE-CAPILLARY: 160 mg/dL — AB (ref 65–99)
Glucose-Capillary: 122 mg/dL — ABNORMAL HIGH (ref 65–99)
Glucose-Capillary: 125 mg/dL — ABNORMAL HIGH (ref 65–99)

## 2015-03-13 LAB — TYPE AND SCREEN
ABO/RH(D): B POS
ANTIBODY SCREEN: POSITIVE
DAT, IGG: POSITIVE
UNIT DIVISION: 0
Unit division: 0
Unit division: 0
Unit division: 0

## 2015-03-13 LAB — BASIC METABOLIC PANEL
ANION GAP: 7 (ref 5–15)
BUN: 13 mg/dL (ref 6–20)
CALCIUM: 7.5 mg/dL — AB (ref 8.9–10.3)
CO2: 21 mmol/L — ABNORMAL LOW (ref 22–32)
Chloride: 99 mmol/L — ABNORMAL LOW (ref 101–111)
Creatinine, Ser: 1.05 mg/dL (ref 0.61–1.24)
GFR calc Af Amer: >60 mL/min (ref >60)
GLUCOSE: 88 mg/dL (ref 65–99)
Potassium: 3.4 mmol/L — ABNORMAL LOW (ref 3.5–5.1)
SODIUM: 127 mmol/L — AB (ref 135–145)

## 2015-03-13 LAB — URINE CULTURE: CULTURE: NO GROWTH

## 2015-03-13 LAB — PREPARE PLATELET PHERESIS: Unit division: 0

## 2015-03-13 LAB — VANCOMYCIN, TROUGH: VANCOMYCIN TR: 9 ug/mL — AB (ref 10.0–20.0)

## 2015-03-13 LAB — HEMOGLOBIN A1C
HEMOGLOBIN A1C: 6.6 % — AB (ref 4.8–5.6)
MEAN PLASMA GLUCOSE: 143 mg/dL

## 2015-03-13 MED ORDER — HYDROCORTISONE NA SUCCINATE PF 100 MG IJ SOLR
100.0000 mg | Freq: Three times a day (TID) | INTRAMUSCULAR | Status: DC
  Administered 2015-03-13 – 2015-03-14 (×4): 100 mg via INTRAVENOUS
  Filled 2015-03-13 (×4): qty 2

## 2015-03-13 MED ORDER — ACYCLOVIR 400 MG PO TABS
400.0000 mg | ORAL_TABLET | Freq: Two times a day (BID) | ORAL | Status: DC
  Administered 2015-03-13 – 2015-03-20 (×15): 400 mg via ORAL
  Filled 2015-03-13 (×17): qty 1

## 2015-03-13 MED ORDER — SODIUM CHLORIDE 0.9 % IV BOLUS (SEPSIS)
250.0000 mL | Freq: Once | INTRAVENOUS | Status: AC
  Administered 2015-03-13: 250 mL via INTRAVENOUS

## 2015-03-13 MED ORDER — VANCOMYCIN HCL IN DEXTROSE 1-5 GM/200ML-% IV SOLN
1000.0000 mg | Freq: Three times a day (TID) | INTRAVENOUS | Status: DC
  Administered 2015-03-13 – 2015-03-19 (×20): 1000 mg via INTRAVENOUS
  Filled 2015-03-13 (×20): qty 200

## 2015-03-13 MED ORDER — TAMSULOSIN HCL 0.4 MG PO CAPS
0.4000 mg | ORAL_CAPSULE | Freq: Every day | ORAL | Status: DC
  Administered 2015-03-13 – 2015-03-20 (×8): 0.4 mg via ORAL
  Filled 2015-03-13 (×8): qty 1

## 2015-03-13 MED ORDER — SODIUM CHLORIDE 0.9 % IV SOLN: Freq: Once | INTRAVENOUS | Status: DC

## 2015-03-13 MED ORDER — IBUPROFEN 200 MG PO TABS
400.0000 mg | ORAL_TABLET | Freq: Once | ORAL | Status: AC
  Administered 2015-03-13: 400 mg via ORAL
  Filled 2015-03-13: qty 2

## 2015-03-13 MED ORDER — POTASSIUM CHLORIDE CRYS ER 20 MEQ PO TBCR
40.0000 meq | EXTENDED_RELEASE_TABLET | Freq: Once | ORAL | Status: AC
  Administered 2015-03-13: 40 meq via ORAL
  Filled 2015-03-13: qty 2

## 2015-03-13 NOTE — Anesthesia Preprocedure Evaluation (Addendum)
Anesthesia Evaluation  Patient identified by MRN, date of birth, ID band Patient awake    Reviewed: Allergy & Precautions, NPO status , Patient's Chart, lab work & pertinent test results  Airway Mallampati: II       Dental  (+) Missing   Pulmonary asthma , sleep apnea and Continuous Positive Airway Pressure Ventilation , COPDformer smoker,  breath sounds clear to auscultation        Cardiovascular hypertension, +CHF IIRhythm:Regular Rate:Normal     Neuro/Psych PSYCHIATRIC DISORDERS Anxiety  Neuromuscular disease    GI/Hepatic PUD, GERD-  Medicated,  Endo/Other  diabetes, Type 2, Oral Hypoglycemic Agents  Renal/GU CRFRenal disease  negative genitourinary   Musculoskeletal  (+) Arthritis -, Osteoarthritis,    Abdominal   Peds negative pediatric ROS (+)  Hematology negative hematology ROS (+)   Anesthesia Other Findings   Reproductive/Obstetrics negative OB ROS                          Lab Results  Component Value Date   WBC 2.1* 03/14/2015   HGB 8.4* 03/14/2015   HCT 22.9* 03/14/2015   MCV 87.7 03/14/2015   PLT 36* 03/14/2015   Lab Results  Component Value Date   CREATININE 0.90 03/14/2015   BUN 17 03/14/2015   NA 128* 03/14/2015   K 4.0 03/14/2015   CL 101 03/14/2015   CO2 21* 03/14/2015   Lab Results  Component Value Date   INR 1.49 03/11/2015   INR 1.12 01/23/2015   INR 1.11 06/17/2014   02/07/2014: EKG: normal sinus rhythm.  Echo (2014) - Technically adequate study - Left ventricle: The cavity size was normal. Wall thickness was increased in a pattern of mild LVH. Systolic function was normal. The estimated ejection fraction was in the range of 60% to 65%. Wall motion was normal; there were no regional wall motion abnormalities. Doppler parameters are consistent with abnormal left ventricular relaxation (grade 1 diastolic dysfunction).  Anesthesia  Physical Anesthesia Plan  ASA: III  Anesthesia Plan: MAC   Post-op Pain Management:    Induction: Intravenous  Airway Management Planned:   Additional Equipment:   Intra-op Plan:   Post-operative Plan:   Informed Consent: I have reviewed the patients History and Physical, chart, labs and discussed the procedure including the risks, benefits and alternatives for the proposed anesthesia with the patient or authorized representative who has indicated his/her understanding and acceptance.   Dental advisory given  Plan Discussed with:   Anesthesia Plan Comments: (If General Anesthesia is needed, oral intubation only secondary to thrombocytopenia. )       Anesthesia Quick Evaluation

## 2015-03-13 NOTE — Progress Notes (Signed)
March 13, 2015 Velva Harman, RN, BSN, CCM: No discharge needs at time chart review.  Will follow for changes and needs. Update: temp 103.2 receiving one unit of prbc hgb 6.5 on [id number] 8.5 [id number] c-pap/iv flds for hypotensive state.

## 2015-03-13 NOTE — Progress Notes (Signed)
IP PROGRESS NOTE  Subjective:   [name] continues to have fever, he appears comfortable this morning. He received red cells and platelets yesterday.  Objective: Vital signs in last 24 hours: Blood pressure 100/60, pulse 87, temperature 98.2 F (36.8 C), temperature source Axillary, resp. rate 27, height 6' (1.829 m), weight 223 lb (101.152 kg), SpO2 98 %.  Intake/Output from previous day: 08/31 0701 - 09/01 0700 In: 3923.8 [I.V.:2456.8; Blood:917; IV Piggyback:550] Out: 9811 [Urine:1125]  Physical Exam:  HEENT: No thrush, no bleeding Lungs: Clear anteriorly Cardiac: Regular rate and rhythm Abdomen: No hepatomegaly, nontender Extremities: No leg edema Neurologic: alert, follows commands   Lab Results:  Recent Labs  03/12/15 0912 03/13/15 0403  WBC 1.2* 1.6*  HGB 6.8* 8.5*  HCT 18.4* 23.3*  PLT 9* 44*   ANC 0.2 BMET  Recent Labs  03/12/15 0912 03/13/15 0403  NA 127* 127*  K 3.9 3.4*  CL 100* 99*  CO2 21* 21*  GLUCOSE 101* 88  BUN 12 13  CREATININE 1.03 1.05  CALCIUM 7.5* 7.5*    Studies/Results: Dg Chest 2 View  03/11/2015   CLINICAL DATA:  Fever with weakness. Possible sepsis from tooth infection. Multiple myeloma. Initial encounter.  EXAM: CHEST  2 VIEW  COMPARISON:  02/28/2015 and 11/21/2014 radiographs.  FINDINGS: The heart size and mediastinal contours are stable. There are slightly lower lung volumes. On today's lateral view, there is increased density in the retrosternal clear space which may correspond with faint lingular opacity on the frontal examination. There is no consolidation or significant pleural effusion. Telemetry leads overlie the chest. Previous lower cervical fusion noted. There are lucencies in the proximal right humerus attributed to the patient's multiple myeloma.  IMPRESSION: Possible developing lingular pneumonia.   Electronically Signed   By: Richardean Sale M.D.   On: 03/11/2015 15:42   Nm Pulmonary Perf And Vent  03/12/2015    CLINICAL DATA:  Difficulty breathing  EXAM: NUCLEAR MEDICINE VENTILATION - PERFUSION LUNG SCAN  Views: Anterior, posterior, left lateral, right lateral, RPO, LPO, RAO, LAO -ventilation and perfusion  Radionuclide: Technetium 75m DTPA -ventilation; Technetium 28m macroaggregated albumin-perfusion  Dose:  42.3 mCi-ventilation; 5.4 mCi-perfusion  Route of administration: Inhalation -ventilation ; intravenous -perfusion  COMPARISON:  Chest radiograph March 11, 2015  FINDINGS: Ventilation: Radiotracer uptake is homogeneous and symmetric bilaterally.  Perfusion: Radiotracer uptake is homogeneous and symmetric bilaterally.  IMPRESSION: There are no appreciable ventilation or perfusion defects. This study constitutes a very low probability of pulmonary embolus.   Electronically Signed   By: Lowella Grip III M.D.   On: 03/12/2015 12:04   Ct Maxillofacial Wo Cm  03/12/2015   CLINICAL DATA:  Pain involving the residual left mandibular molar. Neutropenic fever. Sepsis.  EXAM: CT MAXILLOFACIAL WITHOUT CONTRAST  TECHNIQUE: Multidetector CT imaging of the maxillofacial structures was performed. Multiplanar CT image reconstructions were also generated. A small metallic BB was placed on the right temple in order to reliably differentiate right from left.  COMPARISON:  CT of the neck with contrast 09/01/2009.  FINDINGS: A dental caries is noted along the lateral margin of the crown of the residual left mandibular molar. Periapical lucency is noted about the posterior roots. Lateral soft tissue swelling or early subperiosteal abscess formation is noted with an area of low-density measuring 13 x 15 x 6 mm. The mandible is otherwise intact.  A prominent dental caries are noted in the residual right maxillary molar with significant periapical lucency and minimal gas along the  lateral margin of the root of the tooth.  Mandible is otherwise intact and located.  Mild mucosal thickening is present along the medial inferior aspect of  the left maxillary sinus. There is soft tissue opacification of anterior left ethmoid air cells and the inferior left frontal sinus. The opacification partially occludes the superior nasal cavity on the left is well.  The mastoid air cells are clear.  Limited imaging the brain demonstrates mild atrophy, within normal limits for age.  IMPRESSION: 1. Lateral alveolar soft tissue swelling adjacent to the residual left mandibular molar with an area of low-density representing early or resolving subperiosteal abscess as described. 2. Prominent dental caries involving the right maxillary tooth with periapical lucency and some gas formation lateral to the tooth root. 3. Soft tissue opacification anterior ethmoid air cells extending into the superior nasal cavity. While this may represent a benign polyp, neoplasm or early fungal infection is also considered. Recommend ENT consultation for direct visualization. The lesion was not present in 2010.   Electronically Signed   By: San Morelle M.D.   On: 03/12/2015 13:30    Medications: I have reviewed the patient's current medications.  Assessment/Plan:  1. Multiple myeloma-currently being treated with salvage Daratumumab/Decadron therapy, last treated 03/07/2015  2. Myelodysplasia-treated with 5 azacytidine for progressive pancytopenia, last given June 2016  3. Severe pancytopenia secondary to multiple myeloma, myelodysplasia, and daratumumab  4. Fever-likely related to an infected tooth, possible left lung pneumonia noted on an admission chest x-ray 03/11/2015  5. Pain/tenderness and inflammation at a left lower molar, tooth #18  6.  Chronic lung disease  7.  Poor peripheral IV access  8.  Right leg DVT noted on Doppler 03/13/2015  Mr. Kloosterman continues to have a high fever in the setting of severe neutropenia. The left lower tooth is the most likely  source for infection. Cultures are negative to date. He is scheduled for extraction of tooth #18  on 03/14/2015.  Mr. Haile responded to the platelet transfusion yesterday and the white count is slightly higher. Dr. Enrique Sack requests a platelet transfusion prior to the dental extraction procedure.  Recommendations: 1. Continue broad-spectrum intravenous antibiotics and follow-up cultures 2. Proceed with extraction of tooth #18 3. Transfuse platelets prior to the tooth extraction procedure 4. Continue G-CSF 5. Mr. Vivona is not a candidate for anticoagulation therapy at present. We will discuss the indication for placement of a Greenfield filter following the tooth extraction procedure  I appreciate the care from the internal medicine service and dental medicine. Hematology will continue following Mr. Blew daily.   LOS: 2 days   Jasn Xia, Dominica Severin  03/13/2015, 2:38 PM

## 2015-03-13 NOTE — Progress Notes (Signed)
ANTIBIOTIC CONSULT NOTE - FOLLOW UP  Pharmacy Consult for Vancomycin Indication: Febrile neutropenia  Allergies  Allergen Reactions  . Penicillins Hives and Rash    Patient Measurements: Height: 6' (182.9 cm) Weight: 223 lb (101.152 kg) IBW/kg (Calculated) : 77.6 Adjusted Body Weight:   Vital Signs: Temp: 98.6 F (37 C) (09/01 0520) Temp Source: Oral (09/01 0520) BP: 117/50 mmHg (09/01 0400) Pulse Rate: 59 (09/01 0400) Intake/Output from previous day: 08/31 0701 - 09/01 0700 In: 3373.8 [I.V.:2156.8; Blood:917; IV NATFTDDUK:025] Out: 4270 [Urine:1125] Intake/Output from this shift: Total I/O In: 1993 [I.V.:1303; Blood:640; IV Piggyback:50] Out: 525 [Urine:525]  Labs:  Recent Labs  03/11/15 1145 03/11/15 1145 03/12/15 0912 03/13/15 0403  WBC 0.8*  --  1.2* 1.6*  HGB 8.1*  --  6.8* 8.5*  PLT 16*  --  9* 44*  CREATININE  --  1.2 1.03 1.05   Estimated Creatinine Clearance: 72.5 mL/min (by C-G formula based on Cr of 1.05).  Recent Labs  03/13/15 0403  [place] 9*     Microbiology: Recent Results (from the past 720 hour(s))  TECHNOLOGIST REVIEW     Status: None   Collection Time: 02/14/15  9:06 AM  Result Value Ref Range Status   Technologist Review mod targets  Final  TECHNOLOGIST REVIEW     Status: None   Collection Time: 03/10/15  8:36 AM  Result Value Ref Range Status   Technologist Review 2% Blast  Final  Urine Culture     Status: None   Collection Time: 03/11/15 11:45 AM  Result Value Ref Range Status   Urine Culture, Routine Culture, Urine  Final    Comment: Final - ===== COLONY COUNT: ===== NO GROWTH NO GROWTH   Culture, blood (routine x 2)     Status: None (Preliminary result)   Collection Time: 03/11/15  3:23 PM  Result Value Ref Range Status   Specimen Description BLOOD LEFT WRIST  Final   Special Requests IN PEDIATRIC BOTTLE 3CC  Final   Culture   Final    NO GROWTH < 24 HOURS Performed at [facility]    Report Status  PENDING  Incomplete  Culture, blood (routine x 2)     Status: None (Preliminary result)   Collection Time: 03/11/15  3:44 PM  Result Value Ref Range Status   Specimen Description BLOOD LEFT ANTECUBITAL  Final   Special Requests BOTTLES DRAWN AEROBIC AND ANAEROBIC 5CC  Final   Culture   Final    NO GROWTH < 24 HOURS Performed at [facility]    Report Status PENDING  Incomplete  Urine culture     Status: None   Collection Time: 03/11/15  5:18 PM  Result Value Ref Range Status   Specimen Description URINE, CLEAN CATCH  Final   Special Requests NONE  Final   Culture   Final    3,000 COLONIES/mL INSIGNIFICANT GROWTH Performed at [facility]    Report Status 03/12/2015 FINAL  Final  MRSA PCR Screening     Status: None   Collection Time: 03/11/15  6:28 PM  Result Value Ref Range Status   MRSA by PCR NEGATIVE NEGATIVE Final    Comment:        The GeneXpert MRSA Assay (FDA approved for NASAL specimens only), is one component of a comprehensive MRSA colonization surveillance program. It is not intended to diagnose MRSA infection nor to guide or monitor treatment for MRSA infections.     Anti-infectives  Start     Dose/Rate Route Frequency Ordered Stop   03/13/15 0600  vancomycin (VANCOCIN) IVPB 1000 mg/200 mL premix     1,000 mg 200 mL/hr over 60 Minutes Intravenous Every 8 hours 03/13/15 0526     03/12/15 0600  vancomycin (VANCOCIN) IVPB 1000 mg/200 mL premix  Status:  Discontinued     1,000 mg 200 mL/hr over 60 Minutes Intravenous Every 12 hours 03/11/15 1529 03/13/15 0526   03/11/15 1600  aztreonam (AZACTAM) 2 g in dextrose 5 % 50 mL IVPB     2 g 100 mL/hr over 30 Minutes Intravenous 3 times per day 03/11/15 1529     03/11/15 1600  vancomycin (VANCOCIN) 1,500 mg in sodium chloride 0.9 % 500 mL IVPB     1,500 mg 250 mL/hr over 120 Minutes Intravenous  Once 03/11/15 1529 03/11/15 1826      Assessment: Patient with low vancomycin level.  Goal of  Therapy:  Vancomycin trough level 15-20 mcg/ml  Plan:  Measure antibiotic drug levels at steady state Follow up culture results Change vancomycin to 1gm iv q8hr  Tyler Deis, Shea Stakes Crowford 03/13/2015,5:27 AM

## 2015-03-13 NOTE — Progress Notes (Signed)
[facility] PROGRESS NOTE  [name] MVE:[id number] DOB: [birth year] DOA: 03/11/2015 PCP: Sharion Balloon, FNP  Assessment/Plan: 1-Neutropenic fever/SEPSIS: Likely secondary to HCAP from ongoing chemotherapy. Lactic acid trending down.  Continue with IV fluids, IV antibiotics.  Continue with Granix.  BP soft, will start stress dose steroids.  Decrease IV fluids to avoid pulmonary edema.   D-dimer ; V-Q scan negative. Check doppler. .   Dental infection: Remaining first left mandibular molar very loose with significant surrounding erythema but no appreciable fluctuance.  - antibiotics as above - Follow-up dental recommendations -Dr Enrique Sack consulted 8-31. Will contact office again 9-1.  -CT maxillofacial: Lateral soft tissue swelling adjacent to the residual left mandibular molar representing early or resolving subperiosteal abscess.    Hyponatremia: 126 on admission. Previous 128 on 8/26 and 131 on 8/22.  - IVF NS -follow labs.  -stable.   Multiple Myeloma: ongoing treatment. Last chemo 5 days ago. All hematologic cell lines down. - continue Decadron Qwkly (Friday) on hold. Start stress dose steroids.  - Dr Benay Spice following.    DM: Will hold  invokana and metformin - A1c - SSI  Chronic diastolic CHF: Last echo showing EF of 28-36% grade 1 diastolic dysfunction. Monitor for pulmonary edema, continue with IV fluids.   ANxiety/insomnia: - continue Klonopin  BPH: - continue flomax  Code Status: Full Code.  Family Communication: Care discussed with patient.  Disposition Plan: Remain in the step down unit.    Consultants:  Dr Learta Codding  Procedures:  Right external Jugular.   Antibiotics:  Vancomycin 8-30  Aztreonam 8-30  HPI/Subjective: He is feeling better. Complaining of tooth pain.  Breathing ok.   Objective: Filed Vitals:   03/13/15 0828  BP: 96/52  Pulse: 98  Temp:   Resp: 30    Intake/Output Summary (Last 24 hours) at 03/13/15  0848 Last data filed at 03/13/15 0602  Gross per 24 hour  Intake 3698.83 ml  Output   1125 ml  Net 2573.83 ml   Filed Weights   03/11/15 1635  Weight: 101.152 kg (223 lb)    Exam:   General:  Alert in no distress.  Cardiovascular: S 1, S 2 RRR  Respiratory: crackles at bases.   Abdomen: Bs present, soft, nt  Musculoskeletal: no edema  Data Reviewed: Basic Metabolic Panel:  Recent Labs Lab 03/07/15 0926 03/11/15 1145 03/12/15 0912 03/13/15 0403  NA 128* 126* 127* 127*  K 4.6 4.4 Sl. Hemolysis 3.9 3.4*  CL  --   --  100* 99*  CO2 23 21* 21* 21*  GLUCOSE 137 122 101* 88  BUN 14.4 16.$RemoveBef'1 12 13  'IZmujaGerw$ CREATININE 1.1 1.2 1.03 1.05  CALCIUM 8.6 8.7 7.5* 7.5*   Liver Function Tests:  Recent Labs Lab 03/07/15 0926 03/11/15 1145  AST 14 22  ALT 12 15  ALKPHOS 45 45  BILITOT 0.65 0.73  PROT 8.2 8.2  ALBUMIN 2.7* 2.6*   No results for input(s): LIPASE, AMYLASE in the last 168 hours. No results for input(s): AMMONIA in the last 168 hours. CBC:  Recent Labs Lab 03/07/15 0926 03/10/15 0836 03/11/15 1145 03/12/15 0912 03/13/15 0403  WBC 0.6* 1.3* 0.8* 1.2* 1.6*  NEUTROABS 0.1* 0.1* 0.0* 0.2* 0.2*  HGB 9.1* 7.9* 8.1* 6.8* 8.5*  HCT 25.8* 21.7* 23.2* 18.4* 23.3*  MCV 99.0* 92.7 99.6* 92.0 88.6  PLT 8* 16* 16* 9* 44*   Cardiac Enzymes: No results for input(s): CKTOTAL, CKMB, CKMBINDEX, TROPONINI in the last 168 hours. BNP (last 3  results) No results for input(s): BNP in the last 8760 hours.  ProBNP (last 3 results) No results for input(s): PROBNP in the last 8760 hours.  CBG:  Recent Labs Lab 03/12/15 0749 03/12/15 1230 03/12/15 1623 03/12/15 2222 03/13/15 0757  GLUCAP 105* 96 90 94 105*    Recent Results (from the past 240 hour(s))  TECHNOLOGIST REVIEW     Status: None   Collection Time: 03/10/15  8:36 AM  Result Value Ref Range Status   Technologist Review 2% Blast  Final  Urine Culture     Status: None   Collection Time: 03/11/15 11:45 AM   Result Value Ref Range Status   Urine Culture, Routine Culture, Urine  Final    Comment: Final - ===== COLONY COUNT: ===== NO GROWTH NO GROWTH   Culture, blood (routine x 2)     Status: None (Preliminary result)   Collection Time: 03/11/15  3:23 PM  Result Value Ref Range Status   Specimen Description BLOOD LEFT WRIST  Final   Special Requests IN PEDIATRIC BOTTLE 3CC  Final   Culture   Final    NO GROWTH < 24 HOURS Performed at [facility]    Report Status PENDING  Incomplete  Culture, blood (routine x 2)     Status: None (Preliminary result)   Collection Time: 03/11/15  3:44 PM  Result Value Ref Range Status   Specimen Description BLOOD LEFT ANTECUBITAL  Final   Special Requests BOTTLES DRAWN AEROBIC AND ANAEROBIC 5CC  Final   Culture   Final    NO GROWTH < 24 HOURS Performed at [facility]    Report Status PENDING  Incomplete  Urine culture     Status: None   Collection Time: 03/11/15  5:18 PM  Result Value Ref Range Status   Specimen Description URINE, CLEAN CATCH  Final   Special Requests NONE  Final   Culture   Final    3,000 COLONIES/mL INSIGNIFICANT GROWTH Performed at [facility]    Report Status 03/12/2015 FINAL  Final  MRSA PCR Screening     Status: None   Collection Time: 03/11/15  6:28 PM  Result Value Ref Range Status   MRSA by PCR NEGATIVE NEGATIVE Final    Comment:        The GeneXpert MRSA Assay (FDA approved for NASAL specimens only), is one component of a comprehensive MRSA colonization surveillance program. It is not intended to diagnose MRSA infection nor to guide or monitor treatment for MRSA infections.   Urine culture     Status: None   Collection Time: 03/11/15  9:30 PM  Result Value Ref Range Status   Specimen Description URINE, CLEAN CATCH  Final   Special Requests NONE  Final   Culture   Final    NO GROWTH 1 DAY Performed at [facility]    Report Status 03/13/2015 FINAL  Final     Studies: Dg  Chest 2 View  03/11/2015   CLINICAL DATA:  Fever with weakness. Possible sepsis from tooth infection. Multiple myeloma. Initial encounter.  EXAM: CHEST  2 VIEW  COMPARISON:  02/28/2015 and 11/21/2014 radiographs.  FINDINGS: The heart size and mediastinal contours are stable. There are slightly lower lung volumes. On today's lateral view, there is increased density in the retrosternal clear space which may correspond with faint lingular opacity on the frontal examination. There is no consolidation or significant pleural effusion. Telemetry leads overlie the chest. Previous lower cervical fusion noted.  There are lucencies in the proximal right humerus attributed to the patient's multiple myeloma.  IMPRESSION: Possible developing lingular pneumonia.   Electronically Signed   By: Richardean Sale M.D.   On: 03/11/2015 15:42   Nm Pulmonary Perf And Vent  03/12/2015   CLINICAL DATA:  Difficulty breathing  EXAM: NUCLEAR MEDICINE VENTILATION - PERFUSION LUNG SCAN  Views: Anterior, posterior, left lateral, right lateral, RPO, LPO, RAO, LAO -ventilation and perfusion  Radionuclide: Technetium 39m DTPA -ventilation; Technetium 47m macroaggregated albumin-perfusion  Dose:  42.3 mCi-ventilation; 5.4 mCi-perfusion  Route of administration: Inhalation -ventilation ; intravenous -perfusion  COMPARISON:  Chest radiograph March 11, 2015  FINDINGS: Ventilation: Radiotracer uptake is homogeneous and symmetric bilaterally.  Perfusion: Radiotracer uptake is homogeneous and symmetric bilaterally.  IMPRESSION: There are no appreciable ventilation or perfusion defects. This study constitutes a very low probability of pulmonary embolus.   Electronically Signed   By: Lowella Grip III M.D.   On: 03/12/2015 12:04   Ct Maxillofacial Wo Cm  03/12/2015   CLINICAL DATA:  Pain involving the residual left mandibular molar. Neutropenic fever. Sepsis.  EXAM: CT MAXILLOFACIAL WITHOUT CONTRAST  TECHNIQUE: Multidetector CT imaging of the  maxillofacial structures was performed. Multiplanar CT image reconstructions were also generated. A small metallic BB was placed on the right temple in order to reliably differentiate right from left.  COMPARISON:  CT of the neck with contrast 09/01/2009.  FINDINGS: A dental caries is noted along the lateral margin of the crown of the residual left mandibular molar. Periapical lucency is noted about the posterior roots. Lateral soft tissue swelling or early subperiosteal abscess formation is noted with an area of low-density measuring 13 x 15 x 6 mm. The mandible is otherwise intact.  A prominent dental caries are noted in the residual right maxillary molar with significant periapical lucency and minimal gas along the lateral margin of the root of the tooth.  Mandible is otherwise intact and located.  Mild mucosal thickening is present along the medial inferior aspect of the left maxillary sinus. There is soft tissue opacification of anterior left ethmoid air cells and the inferior left frontal sinus. The opacification partially occludes the superior nasal cavity on the left is well.  The mastoid air cells are clear.  Limited imaging the brain demonstrates mild atrophy, within normal limits for age.  IMPRESSION: 1. Lateral alveolar soft tissue swelling adjacent to the residual left mandibular molar with an area of low-density representing early or resolving subperiosteal abscess as described. 2. Prominent dental caries involving the right maxillary tooth with periapical lucency and some gas formation lateral to the tooth root. 3. Soft tissue opacification anterior ethmoid air cells extending into the superior nasal cavity. While this may represent a benign polyp, neoplasm or early fungal infection is also considered. Recommend ENT consultation for direct visualization. The lesion was not present in 2010.   Electronically Signed   By: San Morelle M.D.   On: 03/12/2015 13:30    Scheduled Meds: .  antiseptic oral rinse  7 mL Mouth Rinse q12n4p  . atorvastatin  20 mg Oral q1800  . aztreonam  2 g Intravenous 3 times per day  . chlorhexidine  15 mL Mouth Rinse BID  . [START ON 03/14/2015] dexamethasone  20 mg Oral Q Fri  . diclofenac sodium  2 g Topical QID  . insulin aspart  0-15 Units Subcutaneous TID WC  . insulin aspart  0-5 Units Subcutaneous QHS  . pantoprazole  40 mg Oral BID  .  sodium chloride  10-40 mL Intracatheter Q12H  . sodium chloride  3 mL Intravenous Q12H  . tamsulosin  0.4 mg Oral QPC supper  . Tbo-Filgrastim  480 mcg Subcutaneous Q24H  . vancomycin  1,000 mg Intravenous Q8H   Continuous Infusions: . sodium chloride 100 mL/hr at 03/12/15 2358    Principal Problem:   Sepsis Active Problems:   GERD (gastroesophageal reflux disease)   Neutropenic fever   Chronic diastolic congestive heart failure   Hyponatremia   Dental infection   Diabetes mellitus with complication   Anxiety state   Thrombocytopenia    Time spent: 35 minutes.     Niel Hummer A  [facility] Pager [phone]. If 7PM-7AM, please contact night-coverage at [email or web address], password [facility] 03/13/2015, 8:48 AM  LOS: 2 days

## 2015-03-13 NOTE — Progress Notes (Signed)
*  PRELIMINARY RESULTS* Vascular Ultrasound Lower extremity venous duplex has been completed.  Preliminary findings: DVT noted in the Right popliteal vein. No DVT LLE.   Landry Mellow, RDMS, RVT  03/13/2015, 11:10 AM

## 2015-03-13 NOTE — Progress Notes (Signed)
At 1600 pt was hypotensive at 82/54.  Informed Dr Tyrell Antonio.  Increased fluids. See order.  Irven Baltimore, RN

## 2015-03-13 NOTE — Consult Note (Signed)
DENTAL CONSULTATION  Date of Consultation:  03/13/2015 Patient Name:   [name] Date of Birth:   June 14, 1936 Medical Record Number: [id number]  VITALS: BP 96/52 mmHg  Pulse 98  Temp(Src) 97.8 F (36.6 C) (Oral)  Resp 30  Ht 6' (1.829 m)  Wt 223 lb (101.152 kg)  BMI 30.24 kg/m2  SpO2 97%  CHIEF COMPLAINT: Patient referred for evaluation of lower left molar.  HPI: [name] is a 79 year old male with multiple myeloma and myelodysplasia, and significant thrombocytopenia. Patient has been under the care of Dr. Julieanne Manson for his chemotherapy needs. Patient [facility] on [facility]/2015.  Patient was complaining of lower left back molar pain for the past several days. In my absence, the patient was referred to Dr. Frederik Schmidt for evaluation as an outpatient on 03/10/2015. Patient was seen by Dr. Benson Norway on 03/10/2015 and was prescribed antibiotic therapy pending discussion of treatment needs with Dr. Benay Spice. The pain subsequently worsened and patient presented to the emergency room for evaluation and treatment. Patient was then admitted to [facility] on 03/12/2015. Patient was placed on IV antibiotic therapy. Patient also had 1 unit of pheresed platelets administered. Dental consultation was then requested by Dr. Tyrell Antonio and Dr. Julieanne Manson. Patient is now seen to evaluate lower left molar and provide treatment as indicated.  The patient is currently complaining of lower left molar pain. Patient indicates the tooth hurts only when it is touched or when he eats. Patient describes pain as being acute and dull in nature and reaching an intensity of 10 out of 10 for " several minutes" . Tooth is currently not hurting him. Patient points to tooth number 18 as the offending tooth. The tooth is 3+ positive to percussion and palpation sensitivity. The tooth is II+ multiple. The patient is interested in having the tooth pulled during  this admission pending a discussion with Dr. Benay Spice. Patient indicates that he has not seen a dentist since tooth number 6 was extracted in 2013 by Dental Medicine. Patient has not had any problems with healing in the area of numbers 6 by report.   PROBLEM LIST: Patient Active Problem List   Diagnosis Date Noted  . Sepsis 03/11/2015  . Neutropenic fever 03/11/2015  . Chronic diastolic congestive heart failure 03/11/2015  . Hyponatremia 03/11/2015  . Dental infection 03/11/2015  . Diabetes mellitus with complication 16/01/3709  . Anxiety state 03/11/2015  . Thrombocytopenia 03/11/2015  . Pain, dental 03/07/2015  . Diabetes mellitus without complication   . Anal skin tags 07/18/2014  . MDS (myelodysplastic syndrome) 06/17/2014  . Fatigue 02/08/2014  . Chest pain 02/08/2014  . Shoulder pain 02/08/2014  . Hypoalbuminemia due to protein-calorie malnutrition 02/08/2014  . Peripheral edema 02/08/2014  . GERD (gastroesophageal reflux disease) 08/14/2013  . Hypoxia 08/07/2013  . SOB (shortness of breath) 08/07/2013  . Pneumonia 08/07/2013  . Diastolic CHF 62/69/4854  . Chest congestion 08/06/2013  . Fever, unspecified 08/06/2013  . Low oxygen saturation 08/06/2013  . Asthma with COPD 08/06/2013  . Recurrent isolated sleep paralysis 06/11/2013  . Dyspnea on exertion 06/11/2013  . Seasonal allergic rhinitis 03/23/2013  . Hemorrhoids, internal, with bleeding and grade 2 prolapse 03/09/2013  . Other malaise and fatigue 01/31/2013  . Essential hypertension, benign 11/06/2012  . Mixed hyperlipidemia 11/06/2012  . Constipation 03/06/2012  . Personal history of colonic adenoma 03/06/2012  . Renal insufficiency 11/09/2011  . Lumbar disc herniation with radiculopathy 11/01/2011  .  Insomnia with sleep apnea 06/06/2011  . Obstructive sleep apnea 03/08/2011  . Multiple myeloma 02/19/2008    PMH: Past Medical History  Diagnosis Date  . GERD (gastroesophageal reflux disease)   .  Essential hypertension, benign   . BPH (benign prostatic hyperplasia)   . Lumbar herniated disc     L4-5  . Hyperlipidemia   . Sleep apnea   . Multiple myeloma 02/19/2008  . Arthritis   . Diverticulosis   . Peptic ulcer disease   . Tubular adenoma 02/16/2008    Dr. Silvano Rusk  . MDS (myelodysplastic syndrome) 06/17/2014  . Diabetes mellitus without complication     PSH: Past Surgical History  Procedure Laterality Date  . Neck surgery    . Lumbar laminectomy/decompression microdiscectomy  11/01/2011    Procedure: LUMBAR LAMINECTOMY/DECOMPRESSION MICRODISCECTOMY 2 LEVELS;  Surgeon: Charlie Pitter, MD;  Location: [facility];  Service: Neurosurgery;  Laterality: Right;  Lumbar Laminectomy/Microdiscectomy Decompression Lumbar Three-Four, Lumbar Four-Five   . Colonoscopy    . Hemorrhoid banding  2014  . Esophagogastroduodenoscopy      ALLERGIES: Allergies  Allergen Reactions  . Penicillins Hives and Rash    MEDICATIONS: Current Facility-Administered Medications  Medication Dose Route Frequency Provider Last Rate Last Dose  . 0.9 %  sodium chloride infusion   Intravenous Continuous Belkys A Regalado, MD 75 mL/hr at 03/13/15 0920    . acetaminophen (TYLENOL) tablet 650 mg  650 mg Oral Q6H PRN Waldemar Dickens, MD   650 mg at 03/13/15 0257   Or  . acetaminophen (TYLENOL) suppository 650 mg  650 mg Rectal Q6H PRN Waldemar Dickens, MD      . acyclovir (ZOVIRAX) tablet 400 mg  400 mg Oral BID Belkys A Regalado, MD      . antiseptic oral rinse (CPC / CETYLPYRIDINIUM CHLORIDE 0.05%) solution 7 mL  7 mL Mouth Rinse q12n4p Waldemar Dickens, MD   7 mL at 03/12/15 1540  . atorvastatin (LIPITOR) tablet 20 mg  20 mg Oral q1800 Waldemar Dickens, MD   20 mg at 03/12/15 1727  . aztreonam (AZACTAM) 2 g in dextrose 5 % 50 mL IVPB  2 g Intravenous 3 times per day Quintella Reichert, MD 100 mL/hr at 03/13/15 0519 2 g at 03/13/15 0519  . chlorhexidine (PERIDEX) 0.12 % solution 15 mL  15 mL Mouth Rinse BID Waldemar Dickens, MD   15 mL at 03/13/15 0921  . clonazePAM (KLONOPIN) tablet 0.5 mg  0.5 mg Oral BID BM & HS PRN Waldemar Dickens, MD   0.5 mg at 03/12/15 2234  . diclofenac sodium (VOLTAREN) 1 % transdermal gel 2 g  2 g Topical QID Waldemar Dickens, MD   2 g at 03/13/15 5400  . hydrocortisone sodium succinate (SOLU-CORTEF) 100 MG injection 100 mg  100 mg Intravenous Q8H Belkys A Regalado, MD   100 mg at 03/13/15 0920  . insulin aspart (novoLOG) injection 0-15 Units  0-15 Units Subcutaneous TID WC Waldemar Dickens, MD   0 Units at 03/12/15 [phone]  . insulin aspart (novoLOG) injection 0-5 Units  0-5 Units Subcutaneous QHS Waldemar Dickens, MD   0 Units at 03/11/15 2200  . loratadine (CLARITIN) tablet 10 mg  10 mg Oral Daily PRN Waldemar Dickens, MD      . oxyCODONE (Oxy IR/ROXICODONE) immediate release tablet 5 mg  5 mg Oral Q4H PRN Waldemar Dickens, MD   5 mg at 03/12/15 1352  .  pantoprazole (PROTONIX) EC tablet 40 mg  40 mg Oral BID Waldemar Dickens, MD   40 mg at 03/13/15 0920  . prochlorperazine (COMPAZINE) tablet 10 mg  10 mg Oral Q6H PRN Waldemar Dickens, MD      . sodium chloride 0.9 % injection 10-40 mL  10-40 mL Intracatheter Q12H Belkys A Regalado, MD   20 mL at 03/13/15 0921  . sodium chloride 0.9 % injection 10-40 mL  10-40 mL Intracatheter PRN Belkys A Regalado, MD      . sodium chloride 0.9 % injection 3 mL  3 mL Intravenous Q12H Waldemar Dickens, MD   3 mL at 03/13/15 1610  . tamsulosin (FLOMAX) capsule 0.4 mg  0.4 mg Oral QPC supper Belkys A Regalado, MD      . Tbo-Filgrastim (GRANIX) injection 480 mcg  480 mcg Subcutaneous Q24H Ladell Pier, MD   480 mcg at 03/12/15 2237  . technetium TC 41M diethylenetriame-pentaacetic acid (DTPA) injection 42.3 milli Curie  42.3 milli Curie Intravenous Once PRN Medication Radiologist, MD      . traMADol (ULTRAM) tablet 50 mg  50 mg Oral Q8H PRN Waldemar Dickens, MD   50 mg at 03/13/15 0920  . vancomycin (VANCOCIN) IVPB 1000 mg/200 mL premix  1,000 mg Intravenous  Q8H Belkys A Regalado, MD   1,000 mg at 03/13/15 0602    LABS: Lab Results  Component Value Date   WBC 1.6* 03/13/2015   HGB 8.5* 03/13/2015   HCT 23.3* 03/13/2015   MCV 88.6 03/13/2015   PLT 44* 03/13/2015      Component Value Date/Time   NA 127* 03/13/2015 0403   NA 126* 03/11/2015 1145   NA 135 05/21/2014 1345   K 3.4* 03/13/2015 0403   K 4.4 Sl. Hemolysis 03/11/2015 1145   CL 99* 03/13/2015 0403   CL 106 12/28/2012 1059   CO2 21* 03/13/2015 0403   CO2 21* 03/11/2015 1145   GLUCOSE 88 03/13/2015 0403   GLUCOSE 122 03/11/2015 1145   GLUCOSE 157* 05/21/2014 1345   GLUCOSE 148* 12/28/2012 1059   BUN 13 03/13/2015 0403   BUN 16.1 03/11/2015 1145   BUN 24 05/21/2014 1345   CREATININE 1.05 03/13/2015 0403   CREATININE 1.2 03/11/2015 1145   CREATININE 0.89 01/31/2013 1217   CALCIUM 7.5* 03/13/2015 0403   CALCIUM 8.7 03/11/2015 1145   GFRNONAA >60 03/13/2015 0403   GFRNONAA 84 01/31/2013 1217   GFRAA >60 03/13/2015 0403   GFRAA >89 01/31/2013 1217   Lab Results  Component Value Date   INR 1.49 03/11/2015   INR 1.12 01/23/2015   INR 1.11 06/17/2014   No results found for: PTT  SOCIAL HISTORY: Social History   Social History  . Marital Status: Married    Spouse Name: N/A  . Number of Children: 4  . Years of Education: N/A   Occupational History  . retired-disabled-Construction    Social History Main Topics  . Smoking status: Former Smoker -- 2.00 packs/day for 20 years    Types: Cigarettes    Start date: 09/15/1962    Quit date: 07/12/1990  . Smokeless tobacco: Never Used  . Alcohol Use: 0.0 oz/week    0 Standard drinks or equivalent per week     Comment: "Very seldom"  . Drug Use: No  . Sexual Activity: No   Other Topics Concern  . Not on file   Social History Narrative   Pt was adopted.   Married   Raises  chickens    FAMILY HISTORY: Family History  Problem Relation Age of Onset  . Adopted: Yes    REVIEW OF SYSTEMS: Reviewed from chart  for this admission.   DENTAL HISTORY: CHIEF COMPLAINT: Patient referred for evaluation of lower left molar.  HPI: [name] is a 79 year old male with multiple myeloma and myelodysplasia, and significant thrombocytopenia. Patient has been under the care of Dr. Julieanne Manson for his chemotherapy needs. Patient presented [facility] of [facility] on 03/08/2015.  Patient was complaining of lower left back molar pain for the past several days. In my absence, the patient was referred to Dr. Frederik Schmidt for evaluation as an outpatient on 03/10/2015. Patient was seen by Dr. Benson Norway on 03/10/2015 and was prescribed antibiotic therapy pending discussion of treatment needs with Dr. Benay Spice. The pain subsequently worsened and patient presented to the emergency room for evaluation and treatment. Patient was then admitted to [facility] hospital on 03/12/2015. Patient was placed on IV antibiotic therapy. Patient also had 1 unit of pheresed platelets administered. Dental consultation was then requested by Dr. Paulla Dolly ado and Dr. Julieanne Manson. Patient is now seen to evaluate lower left molar and provide treatment as indicated.  The patient is currently complaining of lower left molar pain. Patient indicates the tooth hurts only when it is touched or when he eats. Patient describes pain as being acute and dull in nature and reaching an intensity of 10 out of 10 for " several minutes" . Tooth is currently not hurting him. Patient points to tooth number 18 as the offending tooth. The tooth is 3+ positive to percussion and palpation sensitivity. The tooth is II+ multiple. The patient is interested in having the tooth pulled during this admission pending a discussion with Dr. Benay Spice. Patient indicates that he has not seen a dentist since tooth number 6 was extracted in 2013 by Dental Medicine. Patient has not had any problems with healing in the area of numbers 6 by report.  DENTAL  EXAMINATION: GENERAL: The patient is a well-developed, well-nourished male in no acute distress. HEAD AND NECK: There is no significant lymphadenopathy palpated. Patient has no acute TMJ symptoms. INTRAORAL EXAM: The patient has normal saliva. There is no evidence of intraoral abscess formation at this time. DENTITION: The patient is missing tooth numbers 1, 3, 4, 5, 6, 10 through 16, 17, 19, 20, 31, and 32. PERIODONTAL: Patient has chronic periodontitis with plaque and calculus accumulations, generalized gingival recession, and tooth mobility. Tooth #18 has a class II mobility. DENTAL CARIES/SUBOPTIMAL RESTORATIONS: Patient has dental caries noted. ENDODONTIC: Patient is complaining of acute pulpitis symptoms associated with tooth #18. Tooth #18 appears to have periapical pathology for review of orthopantogram from Dr. Benson Norway. CROWN AND BRIDGE: There are no crown or bridge restorations noted. PROSTHODONTIC: The patient has a history of upper and lower cast partial dentures that he has not worn for 6-7 years. OCCLUSION: Patient has a poor occlusal scheme secondary to multiple missing teeth, supra-eruption and drifting of the unopposed teeth into the edentulous areas, and lack of replacement of missing teeth with clinically acceptable dental prostheses.  RADIOGRAPHIC INTERPRETATION: An orthopantogram was obtained from Dr. Raynelle Dick office dated 03/10/2015. There are multiple missing teeth. There is periapical pathology and radiolucency associated with tooth #18. Multiple dental caries are noted. There is moderate to severe bone loss. There is supra-eruption and drifting of the unopposed teeth into the edentulous areas. Several amalgam restorations are noted.   ASSESSMENTS:  1. Multiple myeloma 2. Myelodysplasia 3. Thrombocytopenia 4. Risk for bleeding with invasive dental procedures 5. History of IV bisphosphonate with the risk for osteonecrosis of the jaw with invasive dental procedures 6.  History of Acute pulpitis 7. Chronic apical periodontitis 8. Dental caries 9. Chronic periodontitis with bone loss 10. Tooth mobility with class II+ mobility of #18 11. Generalized gingival recession 12. Multiple missing teeth 13. Supra-eruption and drifting of the unopposed teeth into the edentulous areas. 14. Poor occlusal scheme and malocclusion 15. Significant medical compromise status with the risk for complications up to and including death with anticipated invasive dental procedures.   PLAN/RECOMMENDATIONS: 1. I discussed the risks, benefits, and complications of various treatment options with the patient in relationship to his medical and dental conditions, current chemotherapy, and previous use of IV bisphosphonates. We discussed various treatment options to include no treatment or extraction tooth #18 with alveoloplasty in the operating room with monitored anesthesia care. We also discussed follow-up with the oral surgeon, Dr. Frederik Schmidt, for the dental extraction if so desired. The patient currently wishes to proceed with extraction tooth #18 with alveoplasty in the operating room with monitored anesthesia care tomorrow at 7:15 AM.  Dr. Benay Spice has been contacted and agrees with the plan of care. Dr. Benay Spice will order preoperative platelet transfusion tomorrow for approximately 5 AM. Patient will then have the extraction of tooth #18 in the operating room as indicated. Patient will then be prescribed postoperative Amicar 5% oral rinses. Patient is to rinse with 10 mLs every hour for 10 hours to aid hemostasis. Additional transfusions will occur as indicated per the medical oncology and hospitalist teams that will be following him after the dental extraction procedure. The patient understands the risk for bleeding, bruising, swelling, infection, nerve damage, soft tissue damage, osteonecrosis of the jaw related to previous IV bisphosphonate therapy, root tip fracture, mandible fracture,  and complications from the anesthesia. Patient also understands the risk for competitions up to and including death to his overall medical compromise.   2. Discussion of findings with medical team and coordination of future medical and dental care as needed.   Lenn Cal, DDS

## 2015-03-14 ENCOUNTER — Other Ambulatory Visit: Payer: Self-pay

## 2015-03-14 ENCOUNTER — Inpatient Hospital Stay (HOSPITAL_COMMUNITY): Payer: Medicare HMO | Admitting: Anesthesiology

## 2015-03-14 ENCOUNTER — Ambulatory Visit: Payer: Self-pay | Admitting: Oncology

## 2015-03-14 ENCOUNTER — Ambulatory Visit: Payer: Self-pay

## 2015-03-14 ENCOUNTER — Encounter (HOSPITAL_COMMUNITY)
Admission: EM | Disposition: A | Discharge status: Hospice | Payer: Self-pay | Source: Home / Self Care | Attending: Internal Medicine

## 2015-03-14 DIAGNOSIS — K045 Chronic apical periodontitis: Secondary | ICD-10-CM

## 2015-03-14 DIAGNOSIS — D696 Thrombocytopenia, unspecified: Secondary | ICD-10-CM

## 2015-03-14 DIAGNOSIS — K040 Pulpitis: Secondary | ICD-10-CM

## 2015-03-14 DIAGNOSIS — Z86718 Personal history of other venous thrombosis and embolism: Secondary | ICD-10-CM

## 2015-03-14 DIAGNOSIS — I82409 Acute embolism and thrombosis of unspecified deep veins of unspecified lower extremity: Secondary | ICD-10-CM | POA: Insufficient documentation

## 2015-03-14 HISTORY — PX: MULTIPLE EXTRACTIONS WITH ALVEOLOPLASTY: SHX5342

## 2015-03-14 LAB — CBC WITH DIFFERENTIAL/PLATELET
BAND NEUTROPHILS: 0 % (ref 0–10)
BASOS PCT: 0 % (ref 0–1)
Basophils Absolute: 0 10*3/uL (ref 0.0–0.1)
Blasts: 41 %
EOS ABS: 0 10*3/uL (ref 0.0–0.7)
EOS PCT: 1 % (ref 0–5)
HCT: 22.9 % — ABNORMAL LOW (ref 39.0–52.0)
Hemoglobin: 8.4 g/dL — ABNORMAL LOW (ref 13.0–17.0)
LYMPHS ABS: 0.4 10*3/uL — AB (ref 0.7–4.0)
Lymphocytes Relative: 21 % (ref 12–46)
MCH: 32.2 pg (ref 26.0–34.0)
MCHC: 36.8 g/dL — ABNORMAL HIGH (ref 30.0–36.0)
MCV: 87.7 fL (ref 78.0–100.0)
MONO ABS: 0.1 10*3/uL (ref 0.1–1.0)
MYELOCYTES: 4 %
Metamyelocytes Relative: 0 %
Monocytes Relative: 6 % (ref 3–12)
NEUTROS PCT: 27 % — AB (ref 43–77)
NRBC: 0 /100{WBCs}
Neutro Abs: 0.7 10*3/uL — ABNORMAL LOW (ref 1.7–7.7)
Other: 0 %
PLATELETS: 36 10*3/uL — AB (ref 150–400)
PROMYELOCYTES ABS: 0 %
RBC: 2.61 MIL/uL — ABNORMAL LOW (ref 4.22–5.81)
RDW: 19.3 % — AB (ref 11.5–15.5)
WBC: 2.1 10*3/uL — ABNORMAL LOW (ref 4.0–10.5)

## 2015-03-14 LAB — GLUCOSE, CAPILLARY
GLUCOSE-CAPILLARY: 151 mg/dL — AB (ref 65–99)
Glucose-Capillary: 152 mg/dL — ABNORMAL HIGH (ref 65–99)
Glucose-Capillary: 128 mg/dL — ABNORMAL HIGH (ref 65–99)
Glucose-Capillary: 189 mg/dL — ABNORMAL HIGH (ref 65–99)
Glucose-Capillary: 145 mg/dL — ABNORMAL HIGH (ref 65–99)

## 2015-03-14 LAB — BASIC METABOLIC PANEL
ANION GAP: 6 (ref 5–15)
BUN: 17 mg/dL (ref 6–20)
CALCIUM: 7.4 mg/dL — AB (ref 8.9–10.3)
CO2: 21 mmol/L — ABNORMAL LOW (ref 22–32)
Chloride: 101 mmol/L (ref 101–111)
Creatinine, Ser: 0.9 mg/dL (ref 0.61–1.24)
GFR calc Af Amer: >60 mL/min (ref >60)
GLUCOSE: 127 mg/dL — AB (ref 65–99)
Potassium: 4 mmol/L (ref 3.5–5.1)
SODIUM: 128 mmol/L — AB (ref 135–145)

## 2015-03-14 LAB — VANCOMYCIN, TROUGH: Vancomycin Tr: 16 ug/mL (ref 10.0–20.0)

## 2015-03-14 SURGERY — MULTIPLE EXTRACTION WITH ALVEOLOPLASTY
Anesthesia: Monitor Anesthesia Care | Site: Mouth

## 2015-03-14 MED ORDER — LIDOCAINE HCL (CARDIAC) 20 MG/ML IV SOLN
INTRAVENOUS | Status: AC
  Filled 2015-03-14: qty 5

## 2015-03-14 MED ORDER — MEPERIDINE HCL 25 MG/ML IJ SOLN: 6.2500 mg | INTRAMUSCULAR | Status: DC | PRN

## 2015-03-14 MED ORDER — LIDOCAINE-EPINEPHRINE 2 %-1:100000 IJ SOLN
INTRAMUSCULAR | Status: DC | PRN
  Administered 2015-03-14: 2 mL

## 2015-03-14 MED ORDER — BUPIVACAINE-EPINEPHRINE (PF) 0.5% -1:200000 IJ SOLN
INTRAMUSCULAR | Status: AC
  Filled 2015-03-14: qty 3.6

## 2015-03-14 MED ORDER — LIDOCAINE-EPINEPHRINE 2 %-1:100000 IJ SOLN
INTRAMUSCULAR | Status: AC
  Filled 2015-03-14: qty 10.2

## 2015-03-14 MED ORDER — ONDANSETRON HCL 4 MG/2ML IJ SOLN
INTRAMUSCULAR | Status: AC
  Filled 2015-03-14: qty 2

## 2015-03-14 MED ORDER — LACTATED RINGERS IV SOLN
INTRAVENOUS | Status: DC | PRN
  Administered 2015-03-14: 07:00:00 via INTRAVENOUS

## 2015-03-14 MED ORDER — ISOPROPYL ALCOHOL 70 % SOLN
Status: DC | PRN
  Administered 2015-03-14: 1 via TOPICAL

## 2015-03-14 MED ORDER — ISOPROPYL ALCOHOL 70 % SOLN
Status: AC
  Filled 2015-03-14: qty 480

## 2015-03-14 MED ORDER — FENTANYL CITRATE (PF) 100 MCG/2ML IJ SOLN
INTRAMUSCULAR | Status: AC
  Filled 2015-03-14: qty 2

## 2015-03-14 MED ORDER — PROPOFOL 10 MG/ML IV BOLUS
INTRAVENOUS | Status: AC
  Filled 2015-03-14: qty 20

## 2015-03-14 MED ORDER — PROPOFOL INFUSION 10 MG/ML OPTIME
INTRAVENOUS | Status: DC | PRN
  Administered 2015-03-14: 50 ug/kg/min via INTRAVENOUS

## 2015-03-14 MED ORDER — ROCURONIUM BROMIDE 100 MG/10ML IV SOLN
INTRAVENOUS | Status: AC
  Filled 2015-03-14: qty 1

## 2015-03-14 MED ORDER — LACTATED RINGERS IV SOLN: INTRAVENOUS | Status: DC

## 2015-03-14 MED ORDER — HYDROCORTISONE NA SUCCINATE PF 100 MG IJ SOLR
100.0000 mg | Freq: Two times a day (BID) | INTRAMUSCULAR | Status: DC
  Administered 2015-03-14 – 2015-03-16 (×5): 100 mg via INTRAVENOUS
  Filled 2015-03-14 (×6): qty 2

## 2015-03-14 MED ORDER — PROMETHAZINE HCL 25 MG/ML IJ SOLN: 6.2500 mg | INTRAMUSCULAR | Status: DC | PRN

## 2015-03-14 MED ORDER — LACTATED RINGERS IV SOLN
INTRAVENOUS | Status: DC
  Administered 2015-03-14 – 2015-03-18 (×2): via INTRAVENOUS
  Administered 2015-03-18: 1000 mL via INTRAVENOUS

## 2015-03-14 MED ORDER — AMINOCAPROIC ACID SOLUTION 5% (50 MG/ML)
10.0000 mL | ORAL | Status: AC
  Administered 2015-03-14 (×10): 10 mL via ORAL
  Filled 2015-03-14 (×2): qty 100

## 2015-03-14 MED ORDER — FENTANYL CITRATE (PF) 100 MCG/2ML IJ SOLN
INTRAMUSCULAR | Status: DC | PRN
  Administered 2015-03-14: 25 ug via INTRAVENOUS

## 2015-03-14 MED ORDER — FENTANYL CITRATE (PF) 100 MCG/2ML IJ SOLN
25.0000 ug | INTRAMUSCULAR | Status: DC | PRN
  Administered 2015-03-14 (×2): 50 ug via INTRAVENOUS

## 2015-03-14 MED ORDER — FENTANYL CITRATE (PF) 100 MCG/2ML IJ SOLN
INTRAMUSCULAR | Status: AC
  Filled 2015-03-14: qty 4

## 2015-03-14 SURGICAL SUPPLY — 30 items
ATTRACTOMAT 16X20 MAGNETIC DRP (DRAPES) ×3 IMPLANT
BAG SPEC THK2 15X12 ZIP CLS (MISCELLANEOUS)
BAG ZIPLOCK 12X15 (MISCELLANEOUS) IMPLANT
BANDAGE EYE OVAL (MISCELLANEOUS) ×6 IMPLANT
BLADE SURG 15 STRL LF DISP TIS (BLADE) ×2 IMPLANT
BLADE SURG 15 STRL SS (BLADE) ×6
CANNULA VESSEL W/WING WO/VALVE (CANNULA) ×3 IMPLANT
GAUZE SPONGE 4X4 12PLY STRL (GAUZE/BANDAGES/DRESSINGS) ×3 IMPLANT
GAUZE SPONGE 4X4 16PLY XRAY LF (GAUZE/BANDAGES/DRESSINGS) ×3 IMPLANT
GLOVE BIOGEL PI IND STRL 6 (GLOVE) ×1 IMPLANT
GLOVE BIOGEL PI INDICATOR 6 (GLOVE) ×2
GLOVE SURG ORTHO 8.0 STRL STRW (GLOVE) ×3 IMPLANT
GLOVE SURG SS PI 6.0 STRL IVOR (GLOVE) ×3 IMPLANT
GOWN STRL REUS W/TWL 2XL LVL3 (GOWN DISPOSABLE) ×3 IMPLANT
GOWN STRL REUS W/TWL LRG LVL3 (GOWN DISPOSABLE) ×3 IMPLANT
KIT BASIN OR (CUSTOM PROCEDURE TRAY) ×3 IMPLANT
NS IRRIG 1000ML POUR BTL (IV SOLUTION) ×3 IMPLANT
PACK EENT SPLIT (PACKS) ×3 IMPLANT
PACKING VAGINAL (PACKING) ×3 IMPLANT
SUCTION FRAZIER 12FR DISP (SUCTIONS) IMPLANT
SUT CHROMIC 3 0 PS 2 (SUTURE) ×9 IMPLANT
SUT CHROMIC 4 0 P 3 18 (SUTURE) IMPLANT
SUT SILK 2 0 (SUTURE) ×3
SUT SILK 2-0 18XBRD TIE 12 (SUTURE) IMPLANT
SYR 50ML LL SCALE MARK (SYRINGE) ×3 IMPLANT
TOWEL OR 17X26 10 PK STRL BLUE (TOWEL DISPOSABLE) ×3 IMPLANT
TUBING CONNECTING 10 (TUBING) ×2 IMPLANT
TUBING CONNECTING 10' (TUBING) ×1
WATER STERILE IRR 1500ML POUR (IV SOLUTION) ×3 IMPLANT
YANKAUER SUCT BULB TIP NO VENT (SUCTIONS) ×3 IMPLANT

## 2015-03-14 NOTE — Consult Note (Signed)
Chief Complaint: Patient was seen in consultation today for IVC filter placement Chief Complaint  Patient presents with  . Fever  . Weakness   Referring Physician(s): [facility]  History of Present Illness: [name] is a 79 y.o. male With past medical history significant for multiple myeloma/myelodysplasia, sleep apnea, peptic ulcer disease, GERD, diabetes, BPH, hypertension he was recently admitted to the hospital with neutropenic fever/suspected early sepsis. Patient status post dental extraction of a left molar on 9/2. He has also had some bilateral crampy lower extremity pain. Subsequent imaging has revealed possible developing lingular pneumonia, bilateral alveolar soft tissue swelling at site of prior tooth extraction possibly representing early or resolving subperiosteal abscess . Lower extremity venous Doppler on 9/1 revealed DVT in the right popliteal vein. Most recent lab studies revealed WBC is 2.1, hemoglobin 8.4 platelets 36k, creatinine 0.9. Secondary to severe thrombocytopenia and acute DVT request has now been made for IVC filter placement.  Past Medical History  Diagnosis Date  . GERD (gastroesophageal reflux disease)   . Essential hypertension, benign   . BPH (benign prostatic hyperplasia)   . Lumbar herniated disc     L4-5  . Hyperlipidemia   . Sleep apnea   . Multiple myeloma 02/19/2008  . Arthritis   . Diverticulosis   . Peptic ulcer disease   . Tubular adenoma 02/16/2008    Dr. Silvano Rusk  . MDS (myelodysplastic syndrome) 06/17/2014  . Diabetes mellitus without complication     Past Surgical History  Procedure Laterality Date  . Neck surgery    . Lumbar laminectomy/decompression microdiscectomy  11/01/2011    Procedure: LUMBAR LAMINECTOMY/DECOMPRESSION MICRODISCECTOMY 2 LEVELS;  Surgeon: Charlie Pitter, MD;  Location: [place] [facility];  Service: Neurosurgery;  Laterality: Right;  Lumbar Laminectomy/Microdiscectomy Decompression Lumbar Three-Four, Lumbar Four-Five    . Colonoscopy    . Hemorrhoid banding  2014  . Esophagogastroduodenoscopy      Allergies: Penicillins  Medications: Prior to Admission medications   Medication Sig Start Date End Date Taking? Authorizing Provider  acetaminophen (TYLENOL) 500 MG tablet Take 500 mg by mouth every 6 (six) hours as needed for moderate pain.   Yes Historical Provider, MD  acyclovir (ZOVIRAX) 400 MG tablet TAKE 1 TABLET (400 MG TOTAL) BY MOUTH 2 (TWO) TIMES DAILY. 07/30/14  Yes Ladell Pier, MD  albuterol (PROVENTIL HFA;VENTOLIN HFA) 108 (90 BASE) MCG/ACT inhaler Inhale 2 puffs into the lungs every 6 (six) hours as needed for wheezing or shortness of breath. 02/13/15  Yes Ladell Pier, MD  atorvastatin (LIPITOR) 20 MG tablet TAKE 1 TABLET (20 MG TOTAL) BY MOUTH DAILY.   Yes Lysbeth Penner, FNP  canagliflozin (INVOKANA) 100 MG TABS tablet Take 1 tablet (100 mg total) by mouth daily. 05/21/14  Yes Michelle Bozovich, PHARMD  clonazePAM (KLONOPIN) 0.5 MG tablet TAKE ONE TO TWO TABLETS BY MOUTH 30 MINUTES BEFORE BEDTIME AS NEEDED 10/22/14  Yes Deneise Lever, MD  dexamethasone (DECADRON) 4 MG tablet Take 1 tablet (4 mg total) by mouth 2 (two) times daily with a meal. For 2 days. Begin day after each chemo treatment. 02/13/15  Yes Ladell Pier, MD  dexamethasone (DECADRON) 4 MG tablet Take 20 mg (5 tablets) once a week. Patient taking differently: Take 20 mg by mouth every Friday.  02/21/15  Yes Owens Shark, NP  diclofenac sodium (VOLTAREN) 1 % GEL Apply 2 g topically 4 (four) times daily. 09/13/14  Yes Sharion Balloon, FNP  glucose blood (  ONETOUCH VERIO) test strip 1 each by Other route as needed for other. Use as instructed 08/21/14  Yes Sharion Balloon, FNP  KLOR-CON M20 20 MEQ tablet TAKE ONE TABLET BY MOUTH ONCE DAILY 02/27/15  Yes Sharion Balloon, FNP  loratadine (CLARITIN) 10 MG tablet TAKE 1 TABLET (10 MG TOTAL) BY MOUTH DAILY. 09/18/14  Yes Sharion Balloon, FNP  montelukast (SINGULAIR) 10 MG tablet Take 1  tablet (10 mg total) by mouth at bedtime. Day before and day of each chemo treatment. 02/13/15  Yes Ladell Pier, MD  [facility] DELICA LANCETS FINE MISC 1 each by Does not apply route daily. 08/21/14  Yes Sharion Balloon, FNP  pantoprazole (PROTONIX) 40 MG tablet Take 1 tablet (40 mg total) by mouth 2 (two) times daily. 02/21/15  Yes Owens Shark, NP  polyethylene glycol powder (GLYCOLAX/MIRALAX) powder TAKE 1-2 DOSES DAILY AS NEEDED Patient taking differently: TAKE 1-2 DOSES daily 06/24/14  Yes Gatha Mayer, MD  PRESCRIPTION MEDICATION Chemo - [place]   Yes Historical Provider, MD  prochlorperazine (COMPAZINE) 10 MG tablet TAKE 1 TABLET BY MOUTH EVERY SIX HOURS AS NEEDED   Yes Ladell Pier, MD  tamsulosin (FLOMAX) 0.4 MG CAPS capsule TAKE 1 CAPSULE (0.4 MG TOTAL) BY MOUTH 2 (TWO) TIMES DAILY. WITH A MEAL. 05/28/14  Yes Lysbeth Penner, FNP  traMADol (ULTRAM) 50 MG tablet Take 1 tablet (50 mg total) by mouth every 8 (eight) hours as needed. Patient taking differently: Take 50 mg by mouth every 8 (eight) hours as needed for moderate pain.  03/07/15  Yes Susanne Borders, NP  lactulose (CHRONULAC) 10 GM/15ML solution Take 15 mLs (10 g total) by mouth 2 (two) times daily as needed. Patient not taking: Reported on 03/11/2015 12/23/14   Owens Shark, NP  metFORMIN (GLUCOPHAGE) 500 MG tablet TAKE 1 TABLET (500 MG TOTAL) BY MOUTH 2 (TWO) TIMES DAILY WITH A MEAL. Patient not taking: Reported on 03/11/2015 12/19/14   Sharion Balloon, FNP     Family History  Problem Relation Age of Onset  . Adopted: Yes    Social History   Social History  . Marital Status: Married    Spouse Name: N/A  . Number of Children: 4  . Years of Education: N/A   Occupational History  . retired-disabled-Construction    Social History Main Topics  . Smoking status: Former Smoker -- 2.00 packs/day for 20 years    Types: Cigarettes    Start date: 09/15/1962    Quit date: 07/12/1990  . Smokeless tobacco: Never Used  .  Alcohol Use: 0.0 oz/week    0 Standard drinks or equivalent per week     Comment: "Very seldom"  . Drug Use: No  . Sexual Activity: No   Other Topics Concern  . None   Social History Narrative   Pt was adopted.   Married   Programmer, applications      Review of Systems  see above; patient currently denies fever, chest pain, worsening dyspnea, abdominal, back pain, nausea/ vomiting. He does have occasional headaches.   Vital Signs: BP 141/89 mmHg  Pulse 97  Temp(Src) 98.6 F (37 C) (Oral)  Resp 21  Ht 6' (1.829 m)  Wt 235 lb 14.3 oz (107 kg)  BMI 31.99 kg/m2  SpO2 100%  Physical Exam patient awake, alert. Chest - clear to auscultation bilaterally anteriorly ; heart slightly tachycardic but regular ; abdomen obese, positive bowel sounds, nontender. Extremities without significant edema  Mallampati Score:     Imaging: Dg Chest 2 View  03/11/2015   CLINICAL DATA:  Fever with weakness. Possible sepsis from tooth infection. Multiple myeloma. Initial encounter.  EXAM: CHEST  2 VIEW  COMPARISON:  02/28/2015 and 11/21/2014 radiographs.  FINDINGS: The heart size and mediastinal contours are stable. There are slightly lower lung volumes. On today's lateral view, there is increased density in the retrosternal clear space which may correspond with faint lingular opacity on the frontal examination. There is no consolidation or significant pleural effusion. Telemetry leads overlie the chest. Previous lower cervical fusion noted. There are lucencies in the proximal right humerus attributed to the patient's multiple myeloma.  IMPRESSION: Possible developing lingular pneumonia.   Electronically Signed   By: Richardean Sale M.D.   On: 03/11/2015 15:42   Dg Chest 2 View  02/28/2015   CLINICAL DATA:  Shortness of breath, mid left-sided chest pain for the past week, history of multiple myeloma, diabetes, and remote history of smoking  EXAM: CHEST  2 VIEW  COMPARISON:  PA lateral chest x-ray of Nov 21, 2014  FINDINGS: The lungs are well-expanded with hemidiaphragm flattening. There is no focal infiltrate. There is minimal stable blunting of the right lateral costophrenic angle. The heart and pulmonary vascularity are normal. The mediastinum is normal in width. The bony thorax exhibits no acute abnormality.  IMPRESSION: There is no CHF or pneumonia. Borderline hyperinflation with hemidiaphragm flattening is chronic and may reflect underlying COPD.   Electronically Signed   By: David  Martinique M.D.   On: 02/28/2015 09:52   Nm Pulmonary Perf And Vent  03/12/2015   CLINICAL DATA:  Difficulty breathing  EXAM: NUCLEAR MEDICINE VENTILATION - PERFUSION LUNG SCAN  Views: Anterior, posterior, left lateral, right lateral, RPO, LPO, RAO, LAO -ventilation and perfusion  Radionuclide: Technetium 45mDTPA -ventilation; Technetium 950macroaggregated albumin-perfusion  Dose:  42.3 mCi-ventilation; 5.4 mCi-perfusion  Route of administration: Inhalation -ventilation ; intravenous -perfusion  COMPARISON:  Chest radiograph March 11, 2015  FINDINGS: Ventilation: Radiotracer uptake is homogeneous and symmetric bilaterally.  Perfusion: Radiotracer uptake is homogeneous and symmetric bilaterally.  IMPRESSION: There are no appreciable ventilation or perfusion defects. This study constitutes a very low probability of pulmonary embolus.   Electronically Signed   By: WiLowella GripII M.D.   On: 03/12/2015 12:04   Ct Maxillofacial Wo Cm  03/12/2015   CLINICAL DATA:  Pain involving the residual left mandibular molar. Neutropenic fever. Sepsis.  EXAM: CT MAXILLOFACIAL WITHOUT CONTRAST  TECHNIQUE: Multidetector CT imaging of the maxillofacial structures was performed. Multiplanar CT image reconstructions were also generated. A small metallic BB was placed on the right temple in order to reliably differentiate right from left.  COMPARISON:  CT of the neck with contrast 09/01/2009.  FINDINGS: A dental caries is noted along the lateral  margin of the crown of the residual left mandibular molar. Periapical lucency is noted about the posterior roots. Lateral soft tissue swelling or early subperiosteal abscess formation is noted with an area of low-density measuring 13 x 15 x 6 mm. The mandible is otherwise intact.  A prominent dental caries are noted in the residual right maxillary molar with significant periapical lucency and minimal gas along the lateral margin of the root of the tooth.  Mandible is otherwise intact and located.  Mild mucosal thickening is present along the medial inferior aspect of the left maxillary sinus. There is soft tissue opacification of anterior left ethmoid air cells and the inferior left  frontal sinus. The opacification partially occludes the superior nasal cavity on the left is well.  The mastoid air cells are clear.  Limited imaging the brain demonstrates mild atrophy, within normal limits for age.  IMPRESSION: 1. Lateral alveolar soft tissue swelling adjacent to the residual left mandibular molar with an area of low-density representing early or resolving subperiosteal abscess as described. 2. Prominent dental caries involving the right maxillary tooth with periapical lucency and some gas formation lateral to the tooth root. 3. Soft tissue opacification anterior ethmoid air cells extending into the superior nasal cavity. While this may represent a benign polyp, neoplasm or early fungal infection is also considered. Recommend ENT consultation for direct visualization. The lesion was not present in 2010.   Electronically Signed   By: San Morelle M.D.   On: 03/12/2015 13:30    Labs:  CBC:  Recent Labs  03/11/15 1145 03/12/15 0912 03/13/15 0403 03/14/15 0511  WBC 0.8* 1.2* 1.6* 2.1*  HGB 8.1* 6.8* 8.5* 8.4*  HCT 23.2* 18.4* 23.3* 22.9*  PLT 16* 9* 44* 36*    COAGS:  Recent Labs  06/17/14 0800 01/23/15 0720 03/11/15 2335  INR 1.11 1.12 1.49  APTT 31 26 33    BMP:  Recent Labs   05/21/14 1345  03/11/15 1145 03/12/15 0912 03/13/15 0403 03/14/15 0511  NA 135  < > 126* 127* 127* 128*  K 4.2  < > 4.4 Sl. Hemolysis 3.9 3.4* 4.0  CL 96*  --   --  100* 99* 101  CO2 19  < > 21* 21* 21* 21*  GLUCOSE 157*  < > 122 101* 88 127*  BUN 24  < > 16._0 CALCIUM 8.9  < > 8.7 7.5* 7.5* 7.4*  CREATININE 1.24  < > 1.2 1.03 1.05 0.90  GFRNONAA 56*  --   --  >60 >60 >60  GFRAA 65  --   --  >60 >60 >60  < > = values in this interval not displayed.  LIVER FUNCTION TESTS:  Recent Labs  02/28/15 0815 03/03/15 1025 03/07/15 0926 03/11/15 1145  BILITOT 0.63 0.55 0.65 0.73  AST _1 ALT _2 ALKPHOS 47 38* 45 45  PROT 8.4* 8.1 8.2 8.2  ALBUMIN 2.9* 2.9* 2.7* 2.6*    TUMOR MARKERS: No results for input(s): AFPTM, CEA, CA199, CHROMGRNA in the last 8760 hours.  Assessment and Plan:  Patient with history of multiple myeloma, myelodysplasia, severe pancytopenia, recent dental extraction  , admitted now with neutropenic fever and findings of acute right lower extremity DVT as well as possible lingular pneumonia . Request made for IVC filter placement. Imaging studies and labs were reviewed by Dr. Annamaria Boots and patient was deemed candidate for procedure. Case planned for 9/ 3 AM. Risks and benefits discussed with the patient/wife including, but not limited to bleeding, infection, contrast induced renal failure, filter fracture or migration which can lead to emergency surgery or even death, strut penetration with damage or irritation to adjacent structures and caval thrombosis. All of the patient's questions were answered, patient is agreeable to proceed. Consent signed and in chart.    Thank you for this interesting consult.  I greatly enjoyed meeting Aria Pickrell and look forward to participating in their care.  A copy of this report was sent to the requesting provider on this date.  Signed: D. Rowe Robert 03/14/2015, 4:54 PM   I spent a total of 20 minutes  in face to face in clinical consultation, greater than 50% of which was counseling/coordinating care for IVC filter placement

## 2015-03-14 NOTE — Progress Notes (Signed)
Pt arrived back in room 1231 at 0940.  Pt vss, ice pack applied and is responsive. Minimal bleeding noted from incision site.  Irven Baltimore, RN

## 2015-03-14 NOTE — Discharge Instructions (Signed)

## 2015-03-14 NOTE — Anesthesia Postprocedure Evaluation (Signed)
  Anesthesia Post-op Note  Patient: [name]  Procedure(s) Performed: Procedure(s): EXTRACTION WITH ALVEOLOPLASTY (N/A)  Patient Location: PACU  Anesthesia Type:MAC  Level of Consciousness: awake and alert   Airway and Oxygen Therapy: Patient Spontanous Breathing  Post-op Pain: none  Post-op Assessment: Post-op Vital signs reviewed and Patient's Cardiovascular Status Stable              Post-op Vital Signs: Reviewed and stable  Last Vitals:  Filed Vitals:   03/14/15 0850  BP:   Pulse: 83  Temp:   Resp: 19    Complications: No apparent anesthesia complications

## 2015-03-14 NOTE — Progress Notes (Signed)
PRE-OPERATIVE NOTE:  03/14/2015   [name] [id number]  VITALS: BP 129/73 mmHg  Pulse 70  Temp(Src) 97.8 F (36.6 C) (Oral)  Resp 22  Ht 6' (1.829 m)  Wt 235 lb 14.3 oz (107 kg)  BMI 31.99 kg/m2  SpO2 100%  Lab Results  Component Value Date   WBC 2.1* 03/14/2015   HGB 8.4* 03/14/2015   HCT 22.9* 03/14/2015   MCV 87.7 03/14/2015   PLT 36* 03/14/2015   BMET    Component Value Date/Time   NA 128* 03/14/2015 0511   NA 126* 03/11/2015 1145   NA 135 05/21/2014 1345   K 4.0 03/14/2015 0511   K 4.4 Sl. Hemolysis 03/11/2015 1145   CL 101 03/14/2015 0511   CL 106 12/28/2012 1059   CO2 21* 03/14/2015 0511   CO2 21* 03/11/2015 1145   GLUCOSE 127* 03/14/2015 0511   GLUCOSE 122 03/11/2015 1145   GLUCOSE 157* 05/21/2014 1345   GLUCOSE 148* 12/28/2012 1059   BUN 17 03/14/2015 0511   BUN 16.1 03/11/2015 1145   BUN 24 05/21/2014 1345   CREATININE 0.90 03/14/2015 0511   CREATININE 1.2 03/11/2015 1145   CREATININE 0.89 01/31/2013 1217   CALCIUM 7.4* 03/14/2015 0511   CALCIUM 8.7 03/11/2015 1145   GFRNONAA >60 03/14/2015 0511   GFRNONAA 84 01/31/2013 1217   GFRAA >60 03/14/2015 0511   GFRAA >89 01/31/2013 1217    Lab Results  Component Value Date   INR 1.49 03/11/2015   INR 1.12 01/23/2015   INR 1.11 06/17/2014   No results found for: PTT   [name] presents for extraction of tooth #18 with alveoloplasty in the operating room with monitored anesthesia care. Patient with platelet level at 36,000 prior to transfusion of one unit of pheresed platelets started at 0500 am and completed at 0700 am.   SUBJECTIVE: The patient denies any acute medical or dental changes and agrees to proceed with treatment as planned.  EXAM: No sign of acute dental changes.  ASSESSMENT: Patient is affected by myeloma, myelodysplasia, thrombocytopenia, history of IV bisphosphonates, risk for bleeding, chronic apical periodontitis, dental caries, chronic periodontitis, and tooth  mobility.  PLAN: Patient agrees to proceed with treatment as planned in the operating room as previously discussed and accepts the risks, benefits, and complications of the proposed treatment. Patient is aware of the risk for significant bleeding, bruising, swelling, infection, pain, nerve damage, soft tissue damage, sinus involvement, root tip fracture, mandible fracture, and the risks of complications associated with the anesthesia. Patient is aware of the potential for complications up to and including death due to his overall medical compromise.  Patient also is aware of the potential for other complications not mentioned above.   Lenn Cal, DDS

## 2015-03-14 NOTE — Progress Notes (Signed)
[facility] PROGRESS NOTE  Othon Guardia YTK:[id number] DOB: 19-Dec-1935 DOA: 03/11/2015 PCP: Sharion Balloon, FNP  Assessment/Plan: 1-Neutropenic fever/SEPSIS: Likely secondary to HCAP from ongoing chemotherapy. Lactic acid trending down.  Continue with IV fluids, IV antibiotics.  Continue with Granix.  Taper steroid stress dose.  Decrease IV fluids to avoid pulmonary edema.   D-dimer ; V-Q scan negative. Check doppler. .   Dental infection: Remaining first left mandibular molar very loose with significant surrounding erythema but no appreciable fluctuance.  - antibiotics as above - Follow-up dental recommendations -CT maxillofacial: Lateral soft tissue swelling adjacent to the residual left mandibular molar representing early or resolving subperiosteal abscess.  -patient S/P Extraction of tooth #18 with alveoloplasty -tolerated procedure well.   DVT right popliteal vein;  No candidate for anticoagulation.  IR consulted for IVC filter.   Hyponatremia: 126 on admission. Previous 128 on 8/26 and 131 on 8/22.  - IVF NS -follow labs.  -stable.   Multiple Myeloma: ongoing treatment. Last chemo 5 days ago. All hematologic cell lines down. - continue Decadron Qwkly (Friday) on hold. Start stress dose steroids.  - Dr Benay Spice following.    DM: Will hold  invokana and metformin - A1c - SSI  Chronic diastolic CHF: Last echo showing EF of 75-17% grade 1 diastolic dysfunction. Monitor for pulmonary edema, continue with IV fluids.   ANxiety/insomnia: - continue Klonopin  BPH: - continue flomax  Code Status: Full Code.  Family Communication: Care discussed with patient.  Disposition Plan: Remain in the step down unit.    Consultants:  Dr Learta Codding  Procedures:  Right external Jugular.   Antibiotics:  Vancomycin 8-30  Aztreonam 8-30  HPI/Subjective: Feeling well, trying to eat some . No significant dental pain. Breathing well.   Objective: Filed Vitals:   03/14/15 1600  BP:   Pulse:   Temp: 98.6 F (37 C)  Resp:     Intake/Output Summary (Last 24 hours) at 03/14/15 1728 Last data filed at 03/14/15 1709  Gross per 24 hour  Intake 4167.16 ml  Output    600 ml  Net 3567.16 ml   Filed Weights   03/11/15 1635 03/14/15 0438  Weight: 101.152 kg (223 lb) 107 kg (235 lb 14.3 oz)    Exam:   General:  Alert in no distress.  Cardiovascular: S 1, S 2 RRR  Respiratory: crackles at bases.   Abdomen: Bs present, soft, nt  Musculoskeletal: no edema  Data Reviewed: Basic Metabolic Panel:  Recent Labs Lab 03/11/15 1145 03/12/15 0912 03/13/15 0403 03/14/15 0511  NA 126* 127* 127* 128*  K 4.4 Sl. Hemolysis 3.9 3.4* 4.0  CL  --  100* 99* 101  CO2 21* 21* 21* 21*  GLUCOSE 122 101* 88 127*  BUN 16.$Remov'1 12 13 17  'VWfQxP$ CREATININE 1.2 1.03 1.05 0.90  CALCIUM 8.7 7.5* 7.5* 7.4*   Liver Function Tests:  Recent Labs Lab 03/11/15 1145  AST 22  ALT 15  ALKPHOS 45  BILITOT 0.73  PROT 8.2  ALBUMIN 2.6*   No results for input(s): LIPASE, AMYLASE in the last 168 hours. No results for input(s): AMMONIA in the last 168 hours. CBC:  Recent Labs Lab 03/10/15 0836 03/11/15 1145 03/12/15 0912 03/13/15 0403 03/14/15 0511  WBC 1.3* 0.8* 1.2* 1.6* 2.1*  NEUTROABS 0.1* 0.0* 0.2* 0.2* 0.7*  HGB 7.9* 8.1* 6.8* 8.5* 8.4*  HCT 21.7* 23.2* 18.4* 23.3* 22.9*  MCV 92.7 99.6* 92.0 88.6 87.7  PLT 16* 16* 9* 44* 36*  Cardiac Enzymes: No results for input(s): CKTOTAL, CKMB, CKMBINDEX, TROPONINI in the last 168 hours. BNP (last 3 results) No results for input(s): BNP in the last 8760 hours.  ProBNP (last 3 results) No results for input(s): PROBNP in the last 8760 hours.  CBG:  Recent Labs Lab 03/13/15 2107 03/14/15 0829 03/14/15 0930 03/14/15 1244 03/14/15 1559  GLUCAP 125* 152* 151* 128* 189*    Recent Results (from the past 240 hour(s))  TECHNOLOGIST REVIEW     Status: None   Collection Time: 03/10/15  8:36 AM  Result Value  Ref Range Status   Technologist Review 2% Blast  Final  Urine Culture     Status: None   Collection Time: 03/11/15 11:45 AM  Result Value Ref Range Status   Urine Culture, Routine Culture, Urine  Final    Comment: Final - ===== COLONY COUNT: ===== NO GROWTH NO GROWTH   Culture, blood (routine x 2)     Status: None (Preliminary result)   Collection Time: 03/11/15  3:23 PM  Result Value Ref Range Status   Specimen Description BLOOD LEFT WRIST  Final   Special Requests IN PEDIATRIC BOTTLE 3CC  Final   Culture   Final    NO GROWTH 3 DAYS Performed at [facility]    Report Status PENDING  Incomplete  Culture, blood (routine x 2)     Status: None (Preliminary result)   Collection Time: 03/11/15  3:44 PM  Result Value Ref Range Status   Specimen Description BLOOD LEFT ANTECUBITAL  Final   Special Requests BOTTLES DRAWN AEROBIC AND ANAEROBIC 5CC  Final   Culture   Final    NO GROWTH 3 DAYS Performed at [facility]    Report Status PENDING  Incomplete  Urine culture     Status: None   Collection Time: 03/11/15  5:18 PM  Result Value Ref Range Status   Specimen Description URINE, CLEAN CATCH  Final   Special Requests NONE  Final   Culture   Final    3,000 COLONIES/mL INSIGNIFICANT GROWTH Performed at [facility]    Report Status 03/12/2015 FINAL  Final  MRSA PCR Screening     Status: None   Collection Time: 03/11/15  6:28 PM  Result Value Ref Range Status   MRSA by PCR NEGATIVE NEGATIVE Final    Comment:        The GeneXpert MRSA Assay (FDA approved for NASAL specimens only), is one component of a comprehensive MRSA colonization surveillance program. It is not intended to diagnose MRSA infection nor to guide or monitor treatment for MRSA infections.   Urine culture     Status: None   Collection Time: 03/11/15  9:30 PM  Result Value Ref Range Status   Specimen Description URINE, CLEAN CATCH  Final   Special Requests NONE  Final   Culture    Final    NO GROWTH 1 DAY Performed at [facility]    Report Status 03/13/2015 FINAL  Final     Studies: No results found.  Scheduled Meds: . sodium chloride   Intravenous Once  . acyclovir  400 mg Oral BID  . atorvastatin  20 mg Oral q1800  . aztreonam  2 g Intravenous 3 times per day  . diclofenac sodium  2 g Topical QID  . fentaNYL      . hydrocortisone sod succinate (SOLU-CORTEF) inj  100 mg Intravenous Q8H  . insulin aspart  0-15 Units Subcutaneous TID WC  .  insulin aspart  0-5 Units Subcutaneous QHS  . pantoprazole  40 mg Oral BID  . sodium chloride  10-40 mL Intracatheter Q12H  . sodium chloride  3 mL Intravenous Q12H  . tamsulosin  0.4 mg Oral QPC supper  . Tbo-Filgrastim  480 mcg Subcutaneous Q24H  . vancomycin  1,000 mg Intravenous Q8H   Continuous Infusions: . sodium chloride 100 mL/hr at 03/14/15 1709  . lactated ringers 10 mL/hr at 03/14/15 1116    Principal Problem:   Sepsis Active Problems:   GERD (gastroesophageal reflux disease)   Neutropenic fever   Chronic diastolic congestive heart failure   Hyponatremia   Dental infection   Diabetes mellitus with complication   Anxiety state   Thrombocytopenia   DVT (deep venous thrombosis)    Time spent: 35 minutes.     Niel Hummer A  [facility] Pager [phone]. If 7PM-7AM, please contact night-coverage at [email or web address], password [facility] 03/14/2015, 5:28 PM  LOS: 3 days

## 2015-03-14 NOTE — Progress Notes (Signed)
IP PROGRESS NOTE  Subjective:   [name] reports feeling better today. He underwent extraction of the left molar earlier today. No pain at present.  Objective: Vital signs in last 24 hours: Blood pressure 138/81, pulse 81, temperature 97.2 F (36.2 C), temperature source Oral, resp. rate 21, height 6' (1.829 m), weight 235 lb 14.3 oz (107 kg), SpO2 91 %.  Intake/Output from previous day: 09/01 0701 - 09/02 0700 In: 3338.3 [P.O.:240; I.V.:2165.8; Blood:182.5; IV Piggyback:750] Out: 601 [Urine:600; Stool:1]  Physical Exam:  HEENT: No thrush, no bleeding at the left lower gum extraction site Lungs: Clear anteriorly Cardiac: Regular rate and rhythm Abdomen: No hepatomegaly, nontender Extremities: No leg edema Neurologic: alert, follows commands   Lab Results:  Recent Labs  03/13/15 0403 03/14/15 0511  WBC 1.6* 2.1*  HGB 8.5* 8.4*  HCT 23.3* 22.9*  PLT 44* 36*   ANC 0.7  Peripheral blood smear 03/14/2015: The white cells are decreased in number, the majority of the white cells are lymphocytes and monocytes. A few blasts and mature neutrophils. Marketed variation in red cell size and shape with numerous burr cells, target cells, and a few schistocytes. The polychromasia is not increased. The platelets are decreased in number. No platelet clumps seen.  BMET  Recent Labs  03/13/15 0403 03/14/15 0511  NA 127* 128*  K 3.4* 4.0  CL 99* 101  CO2 21* 21*  GLUCOSE 88 127*  BUN 13 17  CREATININE 1.05 0.90  CALCIUM 7.5* 7.4*    Studies/Results: No results found.  Medications: I have reviewed the patient's current medications.  Assessment/Plan:  1. Multiple myeloma-currently being treated with salvage Daratumumab/Decadron therapy, last treated with week #3 on 03/07/2015  2. Myelodysplasia-treated with 5 azacytidine for progressive pancytopenia, last given June 2016  3. Severe pancytopenia secondary to multiple myeloma, myelodysplasia, and daratumumab  4.  Fever-likely related to an infected tooth, possible left lung pneumonia noted on an admission chest x-ray 03/11/2015  5. Pain/tenderness and inflammation at a left lower molar, tooth #18  6.  Chronic lung disease  7.  Poor peripheral IV access  8.  Right leg DVT noted on Doppler 03/13/2015  [name] underwent extraction of the left molar earlier today. His fever is down and he appears improved. The neutrophil count is higher today. The blasts seen on the peripheral blood smear may be related to myelodysplasia or the G-CSF. I suspect he has persistent myelodysplasia with a preleukemic phase.  He is not a candidate for anticoagulation therapy with the severe thrombocytopenia. I recommend placement of a Greenfield filter if he is confirmed to have an acute DVT on the final Doppler report.   Recommendations: 1. Continue broad-spectrum intravenous antibiotics  2. Increase diet and ambulation as tolerated 3. Transfuse platelets for a count of less than 10,000 or bleeding 4. Continue G-CSF 5. Consider placement of a Greenfield filter if an acute DVT confirmed on the final Doppler report 6. Daily CBC/differential 7. Please call hematology as needed. Dr. Marin Olp will follow him beginning 03/15/2015. I will be out until 03/19/2015.     LOS: 3 days   Audreyana Huntsberry  03/14/2015, 1:06 PM

## 2015-03-14 NOTE — Progress Notes (Signed)
ANTIBIOTIC CONSULT NOTE - Follow up  Pharmacy Consult for Vancomycin and Aztreonam Indication: Febrile neutropenia  Assessment: 79yo M from the [place] with suspected early sepsis, febrile, tachycardic, and neutropenic. Possible tooth infection although the patient reports this feels better after recent antibiotics. He has been on Cipro since 02/21/15. Pharmacy is asked to dose Vancomycin and Aztreonam (penicillin allergic) for febrile neutropenia, sepsis likely secondary to HCAP. Subperiosteal abscess and severe dental caries on CT mxf, now s/p dental tooth #18 extraction on 9/2.   8/30 >> Vanc >> 8/30 >> Aztreonam >>  9/1 resumed PTA acyclovir  Today, 03/14/2015:  Afebrile  WBC: neutropenic but trending up; ANC 700 (Granix 8/30 >> )  Renal: SCr stable wnl; CrCl 87 CG, 69 N  Vancomycin trough level 16 (on Vanc 1g q8h), within therapeutic range.  Goal of Therapy:  Vancomycin trough level 15-20 mcg/ml  Appropriate antibiotic dosing for renal function; eradication of infection  Plan:   Continue Vancomycin 1g IV q8h  Continue Aztreonam 2g IV q8h.  Follow up renal fxn, culture results, and clinical course.   Gretta Arab PharmD, BCPS Pager [phone] 03/14/2015 3:30 PM

## 2015-03-14 NOTE — Progress Notes (Signed)
POST OPERATIVE NOTE:  03/14/2015   [name] [id number]  VITALS: BP 138/81 mmHg  Pulse 81  Temp(Src) 97.2 F (36.2 C) (Oral)  Resp 21  Ht 6' (1.829 m)  Wt 235 lb 14.3 oz (107 kg)  BMI 31.99 kg/m2  SpO2 91%  LABS:  Lab Results  Component Value Date   WBC 2.1* 03/14/2015   HGB 8.4* 03/14/2015   HCT 22.9* 03/14/2015   MCV 87.7 03/14/2015   PLT 36* 03/14/2015   BMET    Component Value Date/Time   NA 128* 03/14/2015 0511   NA 126* 03/11/2015 1145   NA 135 05/21/2014 1345   K 4.0 03/14/2015 0511   K 4.4 Sl. Hemolysis 03/11/2015 1145   CL 101 03/14/2015 0511   CL 106 12/28/2012 1059   CO2 21* 03/14/2015 0511   CO2 21* 03/11/2015 1145   GLUCOSE 127* 03/14/2015 0511   GLUCOSE 122 03/11/2015 1145   GLUCOSE 157* 05/21/2014 1345   GLUCOSE 148* 12/28/2012 1059   BUN 17 03/14/2015 0511   BUN 16.1 03/11/2015 1145   BUN 24 05/21/2014 1345   CREATININE 0.90 03/14/2015 0511   CREATININE 1.2 03/11/2015 1145   CREATININE 0.89 01/31/2013 1217   CALCIUM 7.4* 03/14/2015 0511   CALCIUM 8.7 03/11/2015 1145   GFRNONAA >60 03/14/2015 0511   GFRNONAA 84 01/31/2013 1217   GFRAA >60 03/14/2015 0511   GFRAA >89 01/31/2013 1217    Lab Results  Component Value Date   INR 1.49 03/11/2015   INR 1.12 01/23/2015   INR 1.11 06/17/2014   No results found for: PTT   [name] is status post extraction of tooth #18 with alveoloplasty this morning.  SUBJECTIVE: Patient with some discomfort from the dental extraction.  EXAM: There is no sign of infection, heme, or ooze. Sutures are intact and clots are present.   ASSESSMENT: Post operative course is consistent with dental procedures performed in the OR.   PLAN: 1. Continue Amicar rinse as instructed for 10 doses. 2. Medical team to evaluate for additional blood products if bleeding arises. 3. Will re-evaluate on 9/6.  Lenn Cal, DDS   Lenn Cal, DDS

## 2015-03-14 NOTE — Progress Notes (Addendum)
Patient may go back to Room 1231-per Dr. Smith Robert

## 2015-03-14 NOTE — Progress Notes (Signed)
ANTIBIOTIC CONSULT NOTE - Follow up  Pharmacy Consult for Vancomycin and Aztreonam Indication: Febrile neutropenia  Allergies  Allergen Reactions  . Penicillins Hives and Rash    Patient Measurements: Height: 6' (182.9 cm) Weight: 235 lb 14.3 oz (107 kg) IBW/kg (Calculated) : 77.6 Adjusted Body Weight:   Vital Signs: Temp: 97.2 F (36.2 C) (09/02 0930) Temp Source: Oral (09/02 0652) BP: 138/81 mmHg (09/02 1000) Pulse Rate: 81 (09/02 1000) Intake/Output from previous day: 09/01 0701 - 09/02 0700 In: 3338.3 [P.O.:240; I.V.:2165.8; Blood:182.5; IV Piggyback:750] Out: 601 [Urine:600; Stool:1] Intake/Output from this shift: Total I/O In: 420 [I.V.:420] Out: 0   Labs:  Recent Labs  03/12/15 0912 03/13/15 0403 03/14/15 0511  WBC 1.2* 1.6* 2.1*  HGB 6.8* 8.5* 8.4*  PLT 9* 44* 36*  CREATININE 1.03 1.05 0.90   Estimated Creatinine Clearance: 86.9 mL/min (by C-G formula based on Cr of 0.9).  Recent Labs  03/13/15 0403  [place] 9*     Microbiology: Recent Results (from the past 720 hour(s))  TECHNOLOGIST REVIEW     Status: None   Collection Time: 02/14/15  9:06 AM  Result Value Ref Range Status   Technologist Review mod targets  Final  TECHNOLOGIST REVIEW     Status: None   Collection Time: 03/10/15  8:36 AM  Result Value Ref Range Status   Technologist Review 2% Blast  Final  Urine Culture     Status: None   Collection Time: 03/11/15 11:45 AM  Result Value Ref Range Status   Urine Culture, Routine Culture, Urine  Final    Comment: Final - ===== COLONY COUNT: ===== NO GROWTH NO GROWTH   Culture, blood (routine x 2)     Status: None (Preliminary result)   Collection Time: 03/11/15  3:23 PM  Result Value Ref Range Status   Specimen Description BLOOD LEFT WRIST  Final   Special Requests IN PEDIATRIC BOTTLE 3CC  Final   Culture   Final    NO GROWTH 2 DAYS Performed at [facility]    Report Status PENDING  Incomplete  Culture, blood (routine x  2)     Status: None (Preliminary result)   Collection Time: 03/11/15  3:44 PM  Result Value Ref Range Status   Specimen Description BLOOD LEFT ANTECUBITAL  Final   Special Requests BOTTLES DRAWN AEROBIC AND ANAEROBIC 5CC  Final   Culture   Final    NO GROWTH 2 DAYS Performed at [facility]    Report Status PENDING  Incomplete  Urine culture     Status: None   Collection Time: 03/11/15  5:18 PM  Result Value Ref Range Status   Specimen Description URINE, CLEAN CATCH  Final   Special Requests NONE  Final   Culture   Final    3,000 COLONIES/mL INSIGNIFICANT GROWTH Performed at [facility]    Report Status 03/12/2015 FINAL  Final  MRSA PCR Screening     Status: None   Collection Time: 03/11/15  6:28 PM  Result Value Ref Range Status   MRSA by PCR NEGATIVE NEGATIVE Final    Comment:        The GeneXpert MRSA Assay (FDA approved for NASAL specimens only), is one component of a comprehensive MRSA colonization surveillance program. It is not intended to diagnose MRSA infection nor to guide or monitor treatment for MRSA infections.   Urine culture     Status: None   Collection Time: 03/11/15  9:30 PM  Result Value  Ref Range Status   Specimen Description URINE, CLEAN CATCH  Final   Special Requests NONE  Final   Culture   Final    NO GROWTH 1 DAY Performed at [facility]    Report Status 03/13/2015 FINAL  Final    Medical History: Past Medical History  Diagnosis Date  . GERD (gastroesophageal reflux disease)   . Essential hypertension, benign   . BPH (benign prostatic hyperplasia)   . Lumbar herniated disc     L4-5  . Hyperlipidemia   . Sleep apnea   . Multiple myeloma 02/19/2008  . Arthritis   . Diverticulosis   . Peptic ulcer disease   . Tubular adenoma 02/16/2008    Dr. Silvano Rusk  . MDS (myelodysplastic syndrome) 06/17/2014  . Diabetes mellitus without complication     Medications:  Anti-infectives    Start     Dose/Rate Route  Frequency Ordered Stop   03/13/15 1130  acyclovir (ZOVIRAX) tablet 400 mg     400 mg Oral 2 times daily 03/13/15 1007     03/13/15 0600  vancomycin (VANCOCIN) IVPB 1000 mg/200 mL premix     1,000 mg 200 mL/hr over 60 Minutes Intravenous Every 8 hours 03/13/15 0526     03/12/15 0600  vancomycin (VANCOCIN) IVPB 1000 mg/200 mL premix  Status:  Discontinued     1,000 mg 200 mL/hr over 60 Minutes Intravenous Every 12 hours 03/11/15 1529 03/13/15 0526   03/11/15 1600  aztreonam (AZACTAM) 2 g in dextrose 5 % 50 mL IVPB     2 g 100 mL/hr over 30 Minutes Intravenous 3 times per day 03/11/15 1529     03/11/15 1600  vancomycin (VANCOCIN) 1,500 mg in sodium chloride 0.9 % 500 mL IVPB     1,500 mg 250 mL/hr over 120 Minutes Intravenous  Once 03/11/15 1529 03/11/15 1826     Assessment: 79yo M from the [facility] with suspected early sepsis, febrile, tachycardic, and neutropenic. Possible tooth infection although the patient reports this feels better after recent antibiotics. He has been on Cipro since 02/21/15. Pharmacy is asked to dose Vancomycin and Aztreonam (penicillin allergic) for febrile neutropenia, sepsis likely secondary to HCAP. Subperiosteal abscess and severe dental caries on CT mxf, now s/p dental tooth #18 extraction on 9/2.   8/30 >> Vanc >> 8/30 >> Aztreonam >>  9/1 resumed PTA acyclovir  Temp: afebrile now (103.2 on 8/31) WBC: neutropenic but trending up; ANC 700 (Granix 8/30 >> ) Renal: SCr stable wnl; CrCl 87 CG, 69 N  8/30 blood: NGTD 8/30 urine: 3k insignificant growth 8/30 urine repeat: NGF S. pneumo UAg: neg Legionella UAg: neg  Dose changes/levels: 9/1 0500 VT: 9 on 1 g q12 9/2 1330 VT: ______  Goal of Therapy:  Vancomycin trough level 15-20 mcg/ml  Appropriate antibiotic dosing for renal function; eradication of infection  Plan:  Continue Vancomycin 1g IV q8h and check vancomycin trough this afternoon.  Continue Aztreonam 2g IV q8h. Follow up renal fxn,  culture results, and clinical course.  Ralene Bathe, PharmD, BCPS 03/14/2015, 12:36 PM  Pager: [phone]

## 2015-03-14 NOTE — Op Note (Signed)
OPERATIVE REPORT  Patient:            [name] Date of Birth:  07-02-1936 MRN:                [id number]   DATE OF PROCEDURE:  03/14/2015  PREOPERATIVE DIAGNOSES: 1.  Multiple Myeloma 2. Myelodysplasia 3. Thrombocytopenia 4. History of IV bisphosphonate therapy 5. History of acute pulpitis 6. Chronic apical periodontitis 7. Chronic periodontitis 8. Dental caries 9. Tooth mobility  POSTOPERATIVE DIAGNOSES: 1.  Multiple Myeloma 2. Myelodysplasia 3. Thrombocytopenia 4. History of IV bisphosphonate therapy 5. History of acute pulpitis 6. Chronic apical periodontitis 7. Chronic periodontitis 8. Dental caries 9. Tooth mobility  OPERATIONS: 1. Extraction of tooth #18 with alveoloplasty   SURGEON: Lenn Cal, DDS  ASSISTANT: Camie Patience, (dental assistant)  ANESTHESIA: Monitored anesthesia care per the anesthesia team   MEDICATIONS: 1. Vancomycin 1 g IV per previous orders  2. Local anesthesia with a total utilization of 2 carpules each containing 34 mg of lidocaine with 0.017 mg of epinephrine   SPECIMENS: There was one tooth that was discarded.  DRAINS: None  CULTURES: None  COMPLICATIONS: None   ESTIMATED BLOOD LOSS: Less than 15 mLs.  INTRAVENOUS FLUIDS: 250 mLs of Lactated ringers solution.  INDICATIONS: The patient was recently admitted with sepsis and had a history of multiple myeloma, myelodysplasia, thrombocytopenia, history of IV bisphosphonate therapy, and recent chemotherapy.  A dental consultation was then requested to evaluate lower left molar as source of the infection.  The patient was examined and treatment planned for extraction of tooth #18.  This treatment plan was formulated to decrease the risks and complications associated with dental infection from further affecting the patient's systemic health.  OPERATIVE FINDINGS: Patient was examined operating room number 4.  Tooth #18 was identified for extraction. The patient was noted to  have a 4 x 4 millimeter area of exposed bone involving the buccal aspect of #18 that would increase the risk for potential osteonecrosis of the jaw related to previous IV bisphosphonate therapy. The patient was noted be affected by a history of acute pulpitis, chronic apical periodontitis, dental caries, chronic periodontitis, tooth mobility, history of IV bisphosphonate therapy with risk for osteonecrosis of the jaw with anticipated dental extraction.   DESCRIPTION OF PROCEDURE: Patient was brought to the main operating room number 4. Patient was then placed in the supine position on the operating table. Monitored anesthesia care was then induced per the anesthesia team. The patient was then prepped and draped in the usual manner for dental medicine procedure. A timeout was performed. The patient was identified and procedures were verified. The oral cavity was then thoroughly examined with the findings noted above. The patient was then ready for dental medicine procedure as follows:  Local anesthesia was then administered sequentially with a total utilization of 2 carpules each containing 34 mg of lidocaine with 0.017 mg of epinephrine.  The mandibular left quadrant was first approached. The patient was given a mandibular left inferior alveolar nerve block and long buccal nerve block, and infiltration around tooth #18 with the lidocaine with epinephrine. The soft tissues were then released around tooth #18. Tooth #18 was then removed with a 151 forceps without complications. Alveoloplasty was then performed utilizing a rongeurs and bone file. The tissues were approximated and trimmed appropriately. The surgical sites were then irrigated with copious amounts of sterile saline. A piece of Surgicel was then placed in the extraction sockets to aid hemostasis. The mandibular  left surgical site was then closed from the distal of 18 and extended to the distal of #19 utilizing 3-0 chromic gut material in a  continuous interrupted 2 technique 1. 2 individual interrupted sutures are then placed to further close the surgical site as needed.   At this point time, the entire mouth was irrigated with copious amounts of sterile saline. The patient was examined for complications, seeing none, the dental medicine procedure was deemed to be complete. A series of 4 x 4 gauze moistened with Amicar 5% rinse were placed in the mouth to aid hemostasis. The patient was then handed over to the anesthesia team for final disposition. After an appropriate amount of time, the patient was extubated and taken to the postanesthsia care unit in good condition. All counts were correct for the dental medicine procedure. The patient is to continue using Amicar 5% oral rinse. Patient is to rinse with 10 mls every hour for the next 10 hours and then as needed for persistent oozing. The patient is to use in a swish and spit manner. Patient is to be followed by hospitalist and medical oncology team with administration of blood products and platelets as indicated. Patient is to be maintained on the IV antibiotic therapy either medical team as indicated.   Lenn Cal, DDS.

## 2015-03-14 NOTE — Progress Notes (Signed)
CBG 152  

## 2015-03-14 NOTE — Transfer of Care (Signed)
Immediate Anesthesia Transfer of Care Note  Patient: [name]  Procedure(s) Performed: Procedure(s): EXTRACTION WITH ALVEOLOPLASTY (N/A)  Patient Location: PACU  Anesthesia Type:MAC  Level of Consciousness: awake, alert  and oriented  Airway & Oxygen Therapy: Patient Spontanous Breathing and Patient connected to nasal cannula oxygen  Post-op Assessment: Report given to RN and Post -op Vital signs reviewed and stable  Post vital signs: Reviewed and stable  Last Vitals:  Filed Vitals:   03/14/15 0652  BP: 129/73  Pulse: 70  Temp: 36.6 C  Resp: 22    Complications: No apparent anesthesia complications

## 2015-03-15 ENCOUNTER — Inpatient Hospital Stay (HOSPITAL_COMMUNITY): Payer: Medicare HMO

## 2015-03-15 DIAGNOSIS — R609 Edema, unspecified: Secondary | ICD-10-CM

## 2015-03-15 LAB — GLUCOSE, CAPILLARY
GLUCOSE-CAPILLARY: 108 mg/dL — AB (ref 65–99)
Glucose-Capillary: 145 mg/dL — ABNORMAL HIGH (ref 65–99)
Glucose-Capillary: 179 mg/dL — ABNORMAL HIGH (ref 65–99)

## 2015-03-15 LAB — BASIC METABOLIC PANEL
ANION GAP: 5 (ref 5–15)
ANION GAP: 5 (ref 5–15)
BUN: 19 mg/dL (ref 6–20)
BUN: 22 mg/dL — AB (ref 6–20)
CALCIUM: 7 mg/dL — AB (ref 8.9–10.3)
CALCIUM: 7.4 mg/dL — AB (ref 8.9–10.3)
CO2: 20 mmol/L — ABNORMAL LOW (ref 22–32)
CO2: 23 mmol/L (ref 22–32)
Chloride: 99 mmol/L — ABNORMAL LOW (ref 101–111)
Chloride: 101 mmol/L (ref 101–111)
Creatinine, Ser: 0.8 mg/dL (ref 0.61–1.24)
Creatinine, Ser: 0.96 mg/dL (ref 0.61–1.24)
GFR calc Af Amer: >60 mL/min (ref >60)
GFR calc non Af Amer: >60 mL/min (ref >60)
GLUCOSE: 125 mg/dL — AB (ref 65–99)
GLUCOSE: 211 mg/dL — AB (ref 65–99)
Potassium: 3.8 mmol/L (ref 3.5–5.1)
Potassium: 3.9 mmol/L (ref 3.5–5.1)
SODIUM: 124 mmol/L — AB (ref 135–145)
Sodium: 129 mmol/L — ABNORMAL LOW (ref 135–145)

## 2015-03-15 LAB — OSMOLALITY, URINE: Osmolality, Ur: 696 mOsm/kg (ref 390–1090)

## 2015-03-15 LAB — PROTIME-INR
INR: 1.31 (ref 0.00–1.49)
PROTHROMBIN TIME: 16.4 s — AB (ref 11.6–15.2)

## 2015-03-15 LAB — SODIUM, URINE, RANDOM: SODIUM UR: 12 mmol/L

## 2015-03-15 MED ORDER — FENTANYL CITRATE (PF) 100 MCG/2ML IJ SOLN
INTRAMUSCULAR | Status: AC
  Filled 2015-03-15: qty 6

## 2015-03-15 MED ORDER — IOHEXOL 300 MG/ML  SOLN
80.0000 mL | Freq: Once | INTRAMUSCULAR | Status: DC | PRN
  Administered 2015-03-15: 80 mL via INTRAVENOUS
  Filled 2015-03-15: qty 80

## 2015-03-15 MED ORDER — FENTANYL CITRATE (PF) 100 MCG/2ML IJ SOLN
INTRAMUSCULAR | Status: AC | PRN
  Administered 2015-03-15 (×2): 25 ug via INTRAVENOUS

## 2015-03-15 MED ORDER — MIDAZOLAM HCL 2 MG/2ML IJ SOLN
INTRAMUSCULAR | Status: AC
  Filled 2015-03-15: qty 6

## 2015-03-15 MED ORDER — POTASSIUM CHLORIDE CRYS ER 20 MEQ PO TBCR
40.0000 meq | EXTENDED_RELEASE_TABLET | Freq: Once | ORAL | Status: AC
  Administered 2015-03-15: 40 meq via ORAL
  Filled 2015-03-15: qty 2

## 2015-03-15 MED ORDER — FUROSEMIDE 10 MG/ML IJ SOLN
20.0000 mg | Freq: Once | INTRAMUSCULAR | Status: AC
  Administered 2015-03-15: 20 mg via INTRAVENOUS
  Filled 2015-03-15: qty 2

## 2015-03-15 MED ORDER — MIDAZOLAM HCL 2 MG/2ML IJ SOLN
INTRAMUSCULAR | Status: AC | PRN
  Administered 2015-03-15 (×2): 0.5 mg via INTRAVENOUS

## 2015-03-15 MED ORDER — LIDOCAINE HCL 1 % IJ SOLN
INTRAMUSCULAR | Status: AC
  Filled 2015-03-15: qty 20

## 2015-03-15 MED ORDER — POLYETHYLENE GLYCOL 3350 17 G PO PACK
17.0000 g | PACK | Freq: Two times a day (BID) | ORAL | Status: DC
  Administered 2015-03-15 – 2015-03-21 (×10): 17 g via ORAL
  Filled 2015-03-15 (×12): qty 1

## 2015-03-15 NOTE — Progress Notes (Signed)
Pt.right hand appeared to be larger then the right. Pt.reports that hand was not swollen earlier during the day. Radial pulse present, extremity warm, and capillary refill was less than 3 seconds. Called and notified Mid-level on call with [facility]. New order written for doppler.

## 2015-03-15 NOTE — Sedation Documentation (Signed)
Patient denies pain and is resting comfortably.  

## 2015-03-15 NOTE — Sedation Documentation (Signed)
Vital signs stable. 

## 2015-03-15 NOTE — Progress Notes (Signed)
VASCULAR LAB PRELIMINARY  PRELIMINARY  PRELIMINARY  PRELIMINARY  Right upper extremity venous duplex completed.    Preliminary report:  Right:  DVT noted in the brachial vein coursing from approximately 1 in above the elbow to approximately 2 inches below the axilla.    [name], RVS 03/15/2015, 8:43 AM

## 2015-03-15 NOTE — Progress Notes (Signed)
[facility] PROGRESS NOTE  [name] WRU:[id number] DOB: 1935-12-25 DOA: 03/11/2015 PCP: Sharion Balloon, FNP  Assessment/Plan: 1-Neutropenic fever/SEPSIS: Likely secondary to HCAP from ongoing chemotherapy and tooth infection. Lactic acid trending down.  Continue with IV antibiotics.  Continue with Granix.  Taper steroid stress dose.  Afebrile.   D-dimer ; V-Q scan negative. Doppler right LE DVT popliteal vein.   Dental infection: Remaining first left mandibular molar very loose with significant surrounding erythema but no appreciable fluctuance.  - antibiotics as above - Follow-up dental recommendations -CT maxillofacial: Lateral soft tissue swelling adjacent to the residual left mandibular molar representing early or resolving subperiosteal abscess.  -patient S/P Extraction of tooth #18 with alveoloplasty -tolerated procedure well.   DVT right popliteal vein;  No candidate for anticoagulation.  IR consulted for IVC filter.   Hyponatremia: 126 on admission. Previous 128 on 8/26 and 131 on 8/22.  Worsening hyponatremia. He has received IV fluids. Suspect now hypervolemic hyponatremia/  Will stop IV fluids.  Will give one time IV lasix. Repeat B-met this afternoon/  Check urine sodium and osmolality.   Multiple Myeloma: ongoing treatment. Last chemo 5 days ago. All hematologic cell lines down. - continue Decadron Qwkly (Friday) on hold. On stress steroids dose.  - Dr Benay Spice following.    DM: Will hold  invokana and metformin - A1c - SSI  Chronic diastolic CHF: Last echo showing EF of 91-47% grade 1 diastolic dysfunction. Resume Lasix.   ANxiety/insomnia: - continue Klonopin  BPH: - continue flomax  Right Arm swelling;  Doppler ordered.    Code Status: Full Code.  Family Communication: Care discussed with patient.  Disposition Plan: Remain in the step down unit.    Consultants:  Dr Learta Codding  Procedures:  Right external Jugular.    Antibiotics:  Vancomycin 8-30  Aztreonam 8-30  HPI/Subjective: He is breathing ok, asking about cup and rinse.  Swelling right arm.   Objective: Filed Vitals:   03/15/15 0400  BP: 137/82  Pulse: 79  Temp:   Resp: 20    Intake/Output Summary (Last 24 hours) at 03/15/15 0747 Last data filed at 03/14/15 2212  Gross per 24 hour  Intake 1528.83 ml  Output    850 ml  Net 678.83 ml   Filed Weights   03/11/15 1635 03/14/15 0438  Weight: 101.152 kg (223 lb) 107 kg (235 lb 14.3 oz)    Exam:   General:  Alert in no distress.  Cardiovascular: S 1, S 2 RRR  Respiratory: crackles at bases.   Abdomen: Bs present, soft, nt  Musculoskeletal: no edema  Data Reviewed: Basic Metabolic Panel:  Recent Labs Lab 03/11/15 1145 03/12/15 0912 03/13/15 0403 03/14/15 0511 03/15/15 0600  NA 126* 127* 127* 128* 124*  K 4.4 Sl. Hemolysis 3.9 3.4* 4.0 3.8  CL  --  100* 99* 101 99*  CO2 21* 21* 21* 21* 20*  GLUCOSE 122 101* 88 127* 125*  BUN 16._0 CREATININE 1.2 1.03 1.05 0.90 0.80  CALCIUM 8.7 7.5* 7.5* 7.4* 7.0*   Liver Function Tests:  Recent Labs Lab 03/11/15 1145  AST 22  ALT 15  ALKPHOS 45  BILITOT 0.73  PROT 8.2  ALBUMIN 2.6*   No results for input(s): LIPASE, AMYLASE in the last 168 hours. No results for input(s): AMMONIA in the last 168 hours. CBC:  Recent Labs Lab 03/11/15 1145 03/12/15 0912 03/13/15 0403 03/14/15 0511 03/15/15 0600  WBC 0.8* 1.2* 1.6* 2.1* 2.3*  NEUTROABS  0.0* 0.2* 0.2* 0.7* 0.7*  HGB 8.1* 6.8* 8.5* 8.4* 9.1*  HCT 23.2* 18.4* 23.3* 22.9* 24.7*  MCV 99.6* 92.0 88.6 87.7 88.2  PLT 16* 9* 44* 36* 43*   Cardiac Enzymes: No results for input(s): CKTOTAL, CKMB, CKMBINDEX, TROPONINI in the last 168 hours. BNP (last 3 results) No results for input(s): BNP in the last 8760 hours.  ProBNP (last 3 results) No results for input(s): PROBNP in the last 8760 hours.  CBG:  Recent Labs Lab 03/14/15 0829 03/14/15 0930  03/14/15 1244 03/14/15 1559 03/14/15 2146  GLUCAP 152* 151* 128* 189* 145*    Recent Results (from the past 240 hour(s))  TECHNOLOGIST REVIEW     Status: None   Collection Time: 03/10/15  8:36 AM  Result Value Ref Range Status   Technologist Review 2% Blast  Final  Urine Culture     Status: None   Collection Time: 03/11/15 11:45 AM  Result Value Ref Range Status   Urine Culture, Routine Culture, Urine  Final    Comment: Final - ===== COLONY COUNT: ===== NO GROWTH NO GROWTH   Culture, blood (routine x 2)     Status: None (Preliminary result)   Collection Time: 03/11/15  3:23 PM  Result Value Ref Range Status   Specimen Description BLOOD LEFT WRIST  Final   Special Requests IN PEDIATRIC BOTTLE 3CC  Final   Culture   Final    NO GROWTH 3 DAYS Performed at [facility]    Report Status PENDING  Incomplete  Culture, blood (routine x 2)     Status: None (Preliminary result)   Collection Time: 03/11/15  3:44 PM  Result Value Ref Range Status   Specimen Description BLOOD LEFT ANTECUBITAL  Final   Special Requests BOTTLES DRAWN AEROBIC AND ANAEROBIC 5CC  Final   Culture   Final    NO GROWTH 3 DAYS Performed at [facility]    Report Status PENDING  Incomplete  Urine culture     Status: None   Collection Time: 03/11/15  5:18 PM  Result Value Ref Range Status   Specimen Description URINE, CLEAN CATCH  Final   Special Requests NONE  Final   Culture   Final    3,000 COLONIES/mL INSIGNIFICANT GROWTH Performed at [facility]    Report Status 03/12/2015 FINAL  Final  MRSA PCR Screening     Status: None   Collection Time: 03/11/15  6:28 PM  Result Value Ref Range Status   MRSA by PCR NEGATIVE NEGATIVE Final    Comment:        The GeneXpert MRSA Assay (FDA approved for NASAL specimens only), is one component of a comprehensive MRSA colonization surveillance program. It is not intended to diagnose MRSA infection nor to guide or monitor treatment  for MRSA infections.   Urine culture     Status: None   Collection Time: 03/11/15  9:30 PM  Result Value Ref Range Status   Specimen Description URINE, CLEAN CATCH  Final   Special Requests NONE  Final   Culture   Final    NO GROWTH 1 DAY Performed at [facility]    Report Status 03/13/2015 FINAL  Final     Studies: No results found.  Scheduled Meds: . sodium chloride   Intravenous Once  . acyclovir  400 mg Oral BID  . atorvastatin  20 mg Oral q1800  . aztreonam  2 g Intravenous 3 times per day  . diclofenac   sodium  2 g Topical QID  . furosemide  20 mg Intravenous Once  . hydrocortisone sod succinate (SOLU-CORTEF) inj  100 mg Intravenous Q12H  . insulin aspart  0-15 Units Subcutaneous TID WC  . insulin aspart  0-5 Units Subcutaneous QHS  . pantoprazole  40 mg Oral BID  . sodium chloride  10-40 mL Intracatheter Q12H  . sodium chloride  3 mL Intravenous Q12H  . tamsulosin  0.4 mg Oral QPC supper  . Tbo-Filgrastim  480 mcg Subcutaneous Q24H  . vancomycin  1,000 mg Intravenous Q8H   Continuous Infusions: . lactated ringers 10 mL/hr at 03/14/15 1116    Principal Problem:   Sepsis Active Problems:   GERD (gastroesophageal reflux disease)   Neutropenic fever   Chronic diastolic congestive heart failure   Hyponatremia   Dental infection   Diabetes mellitus with complication   Anxiety state   Thrombocytopenia   DVT (deep venous thrombosis)    Time spent: 35 minutes.     ,  A  [facility] Pager [phone]. If 7PM-7AM, please contact night-coverage at [email or web address], password [email or web address] 03/15/2015, 7:47 AM  LOS: 4 days             

## 2015-03-15 NOTE — Procedures (Signed)
Interventional Radiology Procedure Note  Procedure: Placement of potentially retrievable IVC filter. Infrarenal.  Complications: None  Estimated Blood Loss: 0  Recommendations:  - Bedrest with HOB elevated x 2 hrs  Signed,  Criselda Peaches, MD

## 2015-03-16 ENCOUNTER — Inpatient Hospital Stay (HOSPITAL_COMMUNITY): Payer: Medicare HMO

## 2015-03-16 DIAGNOSIS — Z515 Encounter for palliative care: Secondary | ICD-10-CM | POA: Insufficient documentation

## 2015-03-16 DIAGNOSIS — Z7189 Other specified counseling: Secondary | ICD-10-CM

## 2015-03-16 DIAGNOSIS — G4730 Sleep apnea, unspecified: Secondary | ICD-10-CM

## 2015-03-16 DIAGNOSIS — R5081 Fever presenting with conditions classified elsewhere: Secondary | ICD-10-CM

## 2015-03-16 DIAGNOSIS — I82601 Acute embolism and thrombosis of unspecified veins of right upper extremity: Secondary | ICD-10-CM

## 2015-03-16 LAB — GLUCOSE, CAPILLARY
GLUCOSE-CAPILLARY: 108 mg/dL — AB (ref 65–99)
GLUCOSE-CAPILLARY: 131 mg/dL — AB (ref 65–99)
GLUCOSE-CAPILLARY: 218 mg/dL — AB (ref 65–99)
Glucose-Capillary: 238 mg/dL — ABNORMAL HIGH (ref 65–99)
Glucose-Capillary: 241 mg/dL — ABNORMAL HIGH (ref 65–99)

## 2015-03-16 LAB — URINALYSIS, ROUTINE W REFLEX MICROSCOPIC
Bilirubin Urine: NEGATIVE
GLUCOSE, UA: NEGATIVE mg/dL
HGB URINE DIPSTICK: NEGATIVE
Ketones, ur: NEGATIVE mg/dL
Leukocytes, UA: NEGATIVE
Nitrite: NEGATIVE
PH: 6 (ref 5.0–8.0)
Protein, ur: NEGATIVE mg/dL
SPECIFIC GRAVITY, URINE: 1.026 (ref 1.005–1.030)
Urobilinogen, UA: 2 mg/dL — ABNORMAL HIGH (ref 0.0–1.0)

## 2015-03-16 LAB — BASIC METABOLIC PANEL
Anion gap: 9 (ref 5–15)
BUN: 19 mg/dL (ref 6–20)
CALCIUM: 7.7 mg/dL — AB (ref 8.9–10.3)
CO2: 22 mmol/L (ref 22–32)
CREATININE: 0.88 mg/dL (ref 0.61–1.24)
Chloride: 101 mmol/L (ref 101–111)
GFR calc Af Amer: >60 mL/min (ref >60)
GFR calc non Af Amer: >60 mL/min (ref >60)
GLUCOSE: 82 mg/dL (ref 65–99)
Potassium: 3.5 mmol/L (ref 3.5–5.1)
Sodium: 132 mmol/L — ABNORMAL LOW (ref 135–145)

## 2015-03-16 LAB — CULTURE, BLOOD (ROUTINE X 2)
Culture: NO GROWTH
Culture: NO GROWTH

## 2015-03-16 LAB — PREPARE PLATELET PHERESIS: Unit division: 0

## 2015-03-16 MED ORDER — HYDRALAZINE HCL 20 MG/ML IJ SOLN
10.0000 mg | Freq: Four times a day (QID) | INTRAMUSCULAR | Status: DC | PRN
  Administered 2015-03-16 – 2015-03-18 (×5): 10 mg via INTRAVENOUS
  Filled 2015-03-16 (×5): qty 1

## 2015-03-16 MED ORDER — FLUCONAZOLE IN SODIUM CHLORIDE 100-0.9 MG/50ML-% IV SOLN
100.0000 mg | INTRAVENOUS | Status: DC
  Administered 2015-03-16 – 2015-03-20 (×5): 100 mg via INTRAVENOUS
  Filled 2015-03-16 (×5): qty 50

## 2015-03-16 MED ORDER — MORPHINE SULFATE (PF) 2 MG/ML IV SOLN
1.0000 mg | INTRAVENOUS | Status: DC | PRN
  Administered 2015-03-16 – 2015-03-19 (×4): 2 mg via INTRAVENOUS
  Filled 2015-03-16 (×5): qty 1

## 2015-03-16 NOTE — Progress Notes (Signed)
[name] is having self time. Unfortunately, he now has a thrombus in his right arm. It is apparent that despite the marked thrombocytopenia, that he is hypercoagulable. I think he already has a thrombus in the leg. He has a filter in.  He does not complain of any pain. His right arm might be a little bit swollen.  He's had a low-grade temperature.  His labs today show white cell count 2.5. Hemoglobin 9.4. Platelet count 19,000.  It is going to be very difficult to anticoagulate him with his thrombocytopenia. Typically, upper extremity thromboembolism does not go to the lungs. As such, I then we can just watch this for right now.  He says he is not hurting in the right arm.  He has the sleep apnea. He has the CPAP in.  I like sure if he is really getting out of bed.  He is on broad-spectrum antibiotic coverage.  He is also receiving Neupogen.  Overall, his prognosis just is not that great. He has refractory myeloma. He now is hypercoagulable. He is not a candidate for anticoagulation.  He is not bleeding. As such, I would not worry about the thrombocytopenia.  His vital signs are relatively stable. His pulse is up a little bit. His right arm really does not look as swollen. Has a PICC line in the left arm. Abdomen is soft. Cardiac exam take heart regular. I really cannot appreciate any murmurs.  Again, this is a very complicated situation. Having the thromboembolic disease does not make it any easier. He just is not able to be anticoagulated. He understands the problems that he has. He is very appreciative of the care that he is getting.  I appreciate the outstanding care that he is getting for everybody down in the ICU.!!  [name] E.  Hebrews 12:12

## 2015-03-16 NOTE — Progress Notes (Signed)
Pt woke up around 0345 with complain of  pain in right arm and right leg, he described the pain as shooting ans rated it as 10/10. Pt denied shortness of breath, pulses are strong and equal bilaterally. Patient was given PRN pain meds as ordered on his MAR. Interventional radiologist on all was made aware of patient's pain location, because patient recently had an IVC filter placed for right brachial and popiteal DVT, the radiologist did not think the pain is as a result of the IVC filter. [facility] was also notified, order for a chest X-ray was received due to patient's complain of right lower chest pain. Dr Sandra Cockayne with oncology was also made aware during his morning round.

## 2015-03-16 NOTE — Progress Notes (Signed)
[facility] PROGRESS NOTE  [name] AKV:[id number] DOB: 16-Jun-1936 DOA: 03/11/2015 PCP: Junie Spencer, FNP  Assessment/Plan: 1-Neutropenic fever/SEPSIS: Likely secondary to HCAP from ongoing chemotherapy and tooth infection. Lactic acid trending down.  Continue with IV antibiotics.  Continue with Granix.  On steroid stress dose.  Spike fever today 9-04. Chest x ray with no active diseases. Will send UA.  Will add fluconazole. He has a soft tissue opacification in the superior nasal cavity.   Dental infection: - Remaining first left mandibular molar very loose with significant surrounding erythema but no appreciable fluctuance.  - antibiotics as above -CT maxillofacial: Lateral soft tissue swelling adjacent to the residual left mandibular molar representing early or resolving subperiosteal abscess.  -patient S/P Extraction of tooth #18 with alveoloplasty -tolerated procedure well.   DVT right popliteal vein;  No candidate for anticoagulation.  S/P infrarenal IVC filter 9-03  Right Arm swelling;  Doppler positive for RUE DVT. Not a candidate for anticoagulation.   Hyponatremia: 126 on admission. Previous 128 on 8/26 and 131 on 8/22.  Worsening hyponatremia. He has received IV fluids. Suspect now hypervolemic hyponatremia/  Stop IV fluids.  Sodium improved after IV lasix.   Multiple Myeloma: ongoing treatment. Last chemo 5 days ago. All hematologic cell lines down. - continue Decadron Qwkly (Friday) on hold. On stress steroids dose.  - Dr Truett Perna and Myna Hidalgo following.    DM: Will hold  invokana and metformin - A1c - SSI  Chronic diastolic CHF: Last echo showing EF of 55-60% grade 1 diastolic dysfunction. Holding lasix in setting of fever, incase of sepsis.   ANxiety/insomnia: - continue Klonopin  BPH: - continue flomax  D-dimer ; V-Q scan negative. Doppler right LE DVT popliteal vein.    Code Status: Full Code.  Family Communication: Care discussed with  patient.  Disposition Plan: Remain in the step down unit. Palliative care consulted.    Consultants:  Dr Myrle Sheng  Procedures:  Right external Jugular.   Antibiotics:  Vancomycin 8-30  Aztreonam 8-30  HPI/Subjective: Events from last night notice. He denies chest pain.. No right arm pain at this time.  He is breathing ok. He is very concern with his medical situation. He agree to speak with Palliative care for goals of care.    Objective: Filed Vitals:   03/16/15 0600  BP: 151/47  Pulse: 119  Temp:   Resp: 26    Intake/Output Summary (Last 24 hours) at 03/16/15 0815 Last data filed at 03/16/15 0600  Gross per 24 hour  Intake   1420 ml  Output   1925 ml  Net   -505 ml   Filed Weights   03/11/15 1635 03/14/15 0438  Weight: 101.152 kg (223 lb) 107 kg (235 lb 14.3 oz)    Exam:   General:  Alert in no distress.  Cardiovascular: S 1, S 2 RRR  Respiratory: crackles at bases.   Abdomen: Bs present, soft, nt  Musculoskeletal: right upper extremity edema, right Lower extremity edema.   Data Reviewed: Basic Metabolic Panel:  Recent Labs Lab 03/13/15 0403 03/14/15 0511 03/15/15 0600 03/15/15 1717 03/16/15 0600  NA 127* 128* 124* 129* 132*  K 3.4* 4.0 3.8 3.9 3.5  CL 99* 101 99* 101 101  CO2 21* 21* 20* 23 22  GLUCOSE 88 127* 125* 211* 82  BUN 13 17 19  22* 19  CREATININE 1.05 0.90 0.80 0.96 0.88  CALCIUM 7.5* 7.4* 7.0* 7.4* 7.7*   Liver Function Tests:  Recent Labs Lab 03/11/15  1145  AST 22  ALT 15  ALKPHOS 45  BILITOT 0.73  PROT 8.2  ALBUMIN 2.6*   No results for input(s): LIPASE, AMYLASE in the last 168 hours. No results for input(s): AMMONIA in the last 168 hours. CBC:  Recent Labs Lab 03/12/15 0912 03/13/15 0403 03/14/15 0511 03/15/15 0600 03/16/15 0600  WBC 1.2* 1.6* 2.1* 2.3* 2.5*  NEUTROABS 0.2* 0.2* 0.7* 0.7* 0.6*  HGB 6.8* 8.5* 8.4* 9.1* 9.4*  HCT 18.4* 23.3* 22.9* 24.7* 26.0*  MCV 92.0 88.6 87.7 88.2 88.4  PLT 9* 44*  36* 43* 19*   Cardiac Enzymes: No results for input(s): CKTOTAL, CKMB, CKMBINDEX, TROPONINI in the last 168 hours. BNP (last 3 results) No results for input(s): BNP in the last 8760 hours.  ProBNP (last 3 results) No results for input(s): PROBNP in the last 8760 hours.  CBG:  Recent Labs Lab 03/14/15 2146 03/15/15 0847 03/15/15 1306 03/15/15 1824 03/15/15 2156  GLUCAP 145* 145* 108* 179* 238*    Recent Results (from the past 240 hour(s))  TECHNOLOGIST REVIEW     Status: None   Collection Time: 03/10/15  8:36 AM  Result Value Ref Range Status   Technologist Review 2% Blast  Final  Urine Culture     Status: None   Collection Time: 03/11/15 11:45 AM  Result Value Ref Range Status   Urine Culture, Routine Culture, Urine  Final    Comment: Final - ===== COLONY COUNT: ===== NO GROWTH NO GROWTH   Culture, blood (routine x 2)     Status: None (Preliminary result)   Collection Time: 03/11/15  3:23 PM  Result Value Ref Range Status   Specimen Description BLOOD LEFT WRIST  Final   Special Requests IN PEDIATRIC BOTTLE 3CC  Final   Culture   Final    NO GROWTH 4 DAYS Performed at [facility]    Report Status PENDING  Incomplete  Culture, blood (routine x 2)     Status: None (Preliminary result)   Collection Time: 03/11/15  3:44 PM  Result Value Ref Range Status   Specimen Description BLOOD LEFT ANTECUBITAL  Final   Special Requests BOTTLES DRAWN AEROBIC AND ANAEROBIC 5CC  Final   Culture   Final    NO GROWTH 4 DAYS Performed at [facility]    Report Status PENDING  Incomplete  Urine culture     Status: None   Collection Time: 03/11/15  5:18 PM  Result Value Ref Range Status   Specimen Description URINE, CLEAN CATCH  Final   Special Requests NONE  Final   Culture   Final    3,000 COLONIES/mL INSIGNIFICANT GROWTH Performed at [facility]    Report Status 03/12/2015 FINAL  Final  MRSA PCR Screening     Status: None   Collection Time: 03/11/15   6:28 PM  Result Value Ref Range Status   MRSA by PCR NEGATIVE NEGATIVE Final    Comment:        The GeneXpert MRSA Assay (FDA approved for NASAL specimens only), is one component of a comprehensive MRSA colonization surveillance program. It is not intended to diagnose MRSA infection nor to guide or monitor treatment for MRSA infections.   Urine culture     Status: None   Collection Time: 03/11/15  9:30 PM  Result Value Ref Range Status   Specimen Description URINE, CLEAN CATCH  Final   Special Requests NONE  Final   Culture   Final  NO GROWTH 1 DAY Performed at [facility]    Report Status 03/13/2015 FINAL  Final     Studies: Ir Ivc Filter Plmt / S&i /img Guid/mod Sed  03/15/2015   INDICATION: 79 year old male with acute right popliteal and right brachial DVT in the setting of multiple myeloma, myelodysplastic syndrome and thrombocytopenia. He is not a candidate for anticoagulation therapy and therefore caval interruption is warranted for PE prophylaxis.  EXAM: ULTRASOUND GUIDANCE FOR VASCULARACCESS  IVC CATHETERIZATION AND VENOGRAM  IVC FILTER INSERTION  COMPARISON:  None.  MEDICATIONS: Fentanyl 50 mcg IV; Versed 1 mg IV  ANESTHESIA/SEDATION: Sedation Time  11 minutes  CONTRAST:  79mL OMNIPAQUE IOHEXOL 300 MG/ML  SOLN  FLUOROSCOPY TIME:  1 minutes 24 seconds  503 mGy  COMPLICATIONS: None immediate  PROCEDURE: Informed consent was obtained from the patient following explanation of the procedure, risks, benefits and alternatives. The patient understands, agrees and consents for the procedure. All questions were addressed. A time out was performed prior to the initiation of the procedure.  Maximal barrier sterile technique utilized including caps, mask, sterile gowns, sterile gloves, large sterile drape, hand hygiene, and Betadine prep.  Under sterile condition and local anesthesia, right internal jugular venous access was performed with ultrasound. An ultrasound image was saved  and sent to PACS. Over a guidewire, the IVC filter delivery sheath and inner dilator were advanced into the IVC just above the IVC bifurcation. Contrast injection was performed for an IVC venogram.  Through the delivery sheath, a retrievable Denali IVC filter was deployed below the level of the renal veins and above the IVC bifurcation. Limited post deployment venacavagram was performed.  The delivery sheath was removed and hemostasis was obtained with manual compression. A dressing was placed. The patient tolerated the procedure well without immediate post procedural complication.  FINDINGS: The IVC is patent. No evidence of thrombus, stenosis, or occlusion. No variant venous anatomy. Successful placement of the IVC filter below the level of the renal veins.  IMPRESSION: Successful ultrasound and fluoroscopically guided placement of an infrarenal retrievable IVC filter via right jugular approach.  Due to patient related comorbidities and/or clinical necessity, this IVC filter should be considered a permanent device. This patient will not be actively followed for future filter retrieval.  Signed,  Criselda Peaches, MD  [facility]  [facility]   Electronically Signed   By: Jacqulynn Cadet M.D.   On: 03/15/2015 11:46   Dg Chest Port 1 View  03/16/2015   CLINICAL DATA:  Generalized pain.  Hypertension.  EXAM: PORTABLE CHEST - 1 VIEW  COMPARISON:  03/11/2015  FINDINGS: There is a left upper extremity PICC line with tip in the low SVC. The lungs are grossly clear. There is no large effusion. Pulmonary vasculature is normal.  IMPRESSION: No active disease.   Electronically Signed   By: Andreas Newport M.D.   On: 03/16/2015 05:50    Scheduled Meds: . sodium chloride   Intravenous Once  . acyclovir  400 mg Oral BID  . atorvastatin  20 mg Oral q1800  . aztreonam  2 g Intravenous 3 times per day  . diclofenac sodium  2 g Topical QID  . hydrocortisone sod  succinate (SOLU-CORTEF) inj  100 mg Intravenous Q12H  . insulin aspart  0-15 Units Subcutaneous TID WC  . insulin aspart  0-5 Units Subcutaneous QHS  . pantoprazole  40 mg Oral BID  . polyethylene glycol  17 g Oral BID  .  sodium chloride  10-40 mL Intracatheter Q12H  . sodium chloride  3 mL Intravenous Q12H  . tamsulosin  0.4 mg Oral QPC supper  . Tbo-Filgrastim  480 mcg Subcutaneous Q24H  . vancomycin  1,000 mg Intravenous Q8H   Continuous Infusions: . lactated ringers 10 mL/hr at 03/14/15 1116    Principal Problem:   Sepsis Active Problems:   GERD (gastroesophageal reflux disease)   Neutropenic fever   Chronic diastolic congestive heart failure   Hyponatremia   Dental infection   Diabetes mellitus with complication   Anxiety state   Thrombocytopenia   DVT (deep venous thrombosis)    Time spent: 35 minutes.     Niel Hummer A  [facility] Pager [phone]. If 7PM-7AM, please contact night-coverage at [email or web address], password [email or web address] 03/16/2015, 8:15 AM  LOS: 5 days

## 2015-03-16 NOTE — Progress Notes (Signed)
CRITICAL VALUE ALERT  Critical value received:  Platelet 19  Date of notification:  03/16/15  Time of notification:  0600  Critical value read back:Yes.    Nurse who received alert:  Jari Favre RN  MD notified (1st page):  Dr Sandra Cockayne (present on the unit)  Time of first page:  0715  MD notified (2nd page):  Time of second page:  Responding MD:  Dr Sandra Cockayne  Time MD responded:  0715, no new order received

## 2015-03-16 NOTE — Progress Notes (Signed)
Consultation Note Date: 03/16/2015   Patient Name: [name]  DOB: 1935/09/23  MRN: [id number]  Age / Sex: 79 y.o., male   PCP: Sharion Balloon, FNP Referring Physician: Elmarie Shiley, MD  Reason for Consultation: Establishing goals of care  Palliative Care Assessment and Plan Summary of Established Goals of Care and Medical Treatment Preferences   Clinical Assessment/Narrative: [name] is a 79 year old male with past medical history of multiple myeloma, diabetes mellitus, chronic diastolic heart failure, BPH, anxiety, who is currently admitted with neutropenic fever/sepsis, dental infection, DVT of the right popliteal vein, and right upper extremity DVT. Is also noted to have hyponatremia on admission. Palliative consulted for goals of care in light of his acute status and chronic medical conditions. He is noted to have thrombocytopenia as well as his hypercoagulable state and is not a candidate for anticoagulation at this time.  I met with the patient and his wife. When asked what is most important to him, [name] states his faith. He reports being involved in his church and missing when he cannot get their for services. He also reports that his family and being at home are very important to him as well. In the past is enjoyed fishing but has not been able to do so for the last several years.  I asked him regarding his understanding of his medical condition and he was able to tell me with fairly good detail about his recent course and all of his problems. It appears that he and his wife have been following all of his individual problems, but I am not sure if they truly grasp the severity of his situation.  I asked his thoughts regarding his care at the end of his life, and he reports he would want to die naturally to go to be with the [place]. He reports he is not afraid of dying.  We discussed that his main goal moving forward is to be well enough to be at home and enjoy his time there.  I told him that we should focus our interventions on things that are likely to help him get to this goal. I also introduced the concept of not pursuing treatments if they are unlikely to get him well enough to achieve his goal of going back home. I also expressed my concern about his overall clinical status and wanted to ensure that the choices made moving forward in his care are in line with his goal of being out of the hospital. I expressed concern that if his condition were to deteriorate to the point that he died or required mechanical ventilation that he would never recover to a point that would allow him to achieve his goal of leaving the hospital and returning to his home.  Both he and his wife were very polite during conversation, but I did note whenever conversation turned to any ideas other than his complete recovery that they would often revert to watching TV as a method of avoiding conversation. When I asked his thoughts, he agreed that he felt he had obtained more information than he could process at this time and would like to follow-up again tomorrow.  - The patient reports his daughter is coming tomorrow. He was agreeable to be following up at that time in order to continue conversation.  Contacts/Participants in Discussion: Primary Decision Maker: Patient and his wife   HCPOA: None on chart   Code Status/Advance Care Planning:  Full code  Symptom Management:   Denies  acute symptoms: reports he is feeling tired but otherwise well  Additional Recommendations (Limitations, Scope, Preferences):  We'll continue conversation when his daughters present tomorrow Psycho-social/Spiritual:   Support System: Family and church  Desire for further Chaplaincy support:no  Prognosis: Unable to determine as he remains critically ill  Discharge Planning:  To be determined       Chief Complaint/History of Present Illness:  79 year old male with multiple myeloma admitted with sepsis  and found to have lower extremity DVT, thrombocytopenia, and upper extremity DVT  Primary Diagnoses  Present on Admission:  . Sepsis . GERD (gastroesophageal reflux disease) . Neutropenic fever . (Resolved) Chronic apical periodontitis  Palliative Review of Systems: Denies any symptoms other than fatigue I have reviewed the medical record, interviewed the patient and family, and examined the patient. The following aspects are pertinent.  Past Medical History  Diagnosis Date  . GERD (gastroesophageal reflux disease)   . Essential hypertension, benign   . BPH (benign prostatic hyperplasia)   . Lumbar herniated disc     L4-5  . Hyperlipidemia   . Sleep apnea   . Multiple myeloma 02/19/2008  . Arthritis   . Diverticulosis   . Peptic ulcer disease   . Tubular adenoma 02/16/2008    Dr. Silvano Rusk  . MDS (myelodysplastic syndrome) 06/17/2014  . Diabetes mellitus without complication    Social History   Social History  . Marital Status: Married    Spouse Name: N/A  . Number of Children: 4  . Years of Education: N/A   Occupational History  . retired-disabled-Construction    Social History Main Topics  . Smoking status: Former Smoker -- 2.00 packs/day for 20 years    Types: Cigarettes    Start date: 09/15/1962    Quit date: 07/12/1990  . Smokeless tobacco: Never Used  . Alcohol Use: 0.0 oz/week    0 Standard drinks or equivalent per week     Comment: "Very seldom"  . Drug Use: No  . Sexual Activity: No   Other Topics Concern  . None   Social History Narrative   Pt was adopted.   Married   Programmer, applications   Family History  Problem Relation Age of Onset  . Adopted: Yes   Scheduled Meds: . sodium chloride   Intravenous Once  . acyclovir  400 mg Oral BID  . atorvastatin  20 mg Oral q1800  . aztreonam  2 g Intravenous 3 times per day  . diclofenac sodium  2 g Topical QID  . fluconazole (DIFLUCAN) IV  100 mg Intravenous Q24H  . hydrocortisone sod succinate  (SOLU-CORTEF) inj  100 mg Intravenous Q12H  . insulin aspart  0-15 Units Subcutaneous TID WC  . insulin aspart  0-5 Units Subcutaneous QHS  . pantoprazole  40 mg Oral BID  . polyethylene glycol  17 g Oral BID  . sodium chloride  10-40 mL Intracatheter Q12H  . sodium chloride  3 mL Intravenous Q12H  . tamsulosin  0.4 mg Oral QPC supper  . Tbo-Filgrastim  480 mcg Subcutaneous Q24H  . vancomycin  1,000 mg Intravenous Q8H   Continuous Infusions: . lactated ringers 10 mL/hr at 03/14/15 1116   PRN Meds:.acetaminophen **OR** acetaminophen, clonazePAM, hydrALAZINE, iohexol, loratadine, morphine injection, oxyCODONE, prochlorperazine, sodium chloride, technetium TC 11M diethylenetriame-pentaacetic acid, traMADol Medications Prior to Admission:  Prior to Admission medications   Medication Sig Start Date End Date Taking? Authorizing Provider  acetaminophen (TYLENOL) 500 MG tablet Take 500 mg by mouth every 6 (  six) hours as needed for moderate pain.   Yes Historical Provider, MD  acyclovir (ZOVIRAX) 400 MG tablet TAKE 1 TABLET (400 MG TOTAL) BY MOUTH 2 (TWO) TIMES DAILY. 07/30/14  Yes Ladene Artist, MD  albuterol (PROVENTIL HFA;VENTOLIN HFA) 108 (90 BASE) MCG/ACT inhaler Inhale 2 puffs into the lungs every 6 (six) hours as needed for wheezing or shortness of breath. 02/13/15  Yes Ladene Artist, MD  atorvastatin (LIPITOR) 20 MG tablet TAKE 1 TABLET (20 MG TOTAL) BY MOUTH DAILY.   Yes Deatra Canter, FNP  canagliflozin (INVOKANA) 100 MG TABS tablet Take 1 tablet (100 mg total) by mouth daily. 05/21/14  Yes Michelle Bozovich, PHARMD  clonazePAM (KLONOPIN) 0.5 MG tablet TAKE ONE TO TWO TABLETS BY MOUTH 30 MINUTES BEFORE BEDTIME AS NEEDED 10/22/14  Yes Waymon Budge, MD  dexamethasone (DECADRON) 4 MG tablet Take 1 tablet (4 mg total) by mouth 2 (two) times daily with a meal. For 2 days. Begin day after each chemo treatment. 02/13/15  Yes Ladene Artist, MD  dexamethasone (DECADRON) 4 MG tablet Take 20 mg  (5 tablets) once a week. Patient taking differently: Take 20 mg by mouth every Friday.  02/21/15  Yes Rana Snare, NP  diclofenac sodium (VOLTAREN) 1 % GEL Apply 2 g topically 4 (four) times daily. 09/13/14  Yes Junie Spencer, FNP  glucose blood (ONETOUCH VERIO) test strip 1 each by Other route as needed for other. Use as instructed 08/21/14  Yes Junie Spencer, FNP  KLOR-CON M20 20 MEQ tablet TAKE ONE TABLET BY MOUTH ONCE DAILY 02/27/15  Yes Junie Spencer, FNP  loratadine (CLARITIN) 10 MG tablet TAKE 1 TABLET (10 MG TOTAL) BY MOUTH DAILY. 09/18/14  Yes Junie Spencer, FNP  montelukast (SINGULAIR) 10 MG tablet Take 1 tablet (10 mg total) by mouth at bedtime. Day before and day of each chemo treatment. 02/13/15  Yes Ladene Artist, MD  [facility] DELICA LANCETS FINE MISC 1 each by Does not apply route daily. 08/21/14  Yes Junie Spencer, FNP  pantoprazole (PROTONIX) 40 MG tablet Take 1 tablet (40 mg total) by mouth 2 (two) times daily. 02/21/15  Yes Rana Snare, NP  polyethylene glycol powder (GLYCOLAX/MIRALAX) powder TAKE 1-2 DOSES DAILY AS NEEDED Patient taking differently: TAKE 1-2 DOSES daily 06/24/14  Yes Iva Boop, MD  PRESCRIPTION MEDICATION Chemo - [facility]   Yes Historical Provider, MD  prochlorperazine (COMPAZINE) 10 MG tablet TAKE 1 TABLET BY MOUTH EVERY SIX HOURS AS NEEDED   Yes Ladene Artist, MD  tamsulosin (FLOMAX) 0.4 MG CAPS capsule TAKE 1 CAPSULE (0.4 MG TOTAL) BY MOUTH 2 (TWO) TIMES DAILY. WITH A MEAL. 05/28/14  Yes Deatra Canter, FNP  traMADol (ULTRAM) 50 MG tablet Take 1 tablet (50 mg total) by mouth every 8 (eight) hours as needed. Patient taking differently: Take 50 mg by mouth every 8 (eight) hours as needed for moderate pain.  03/07/15  Yes Tillman Abide, NP  lactulose (CHRONULAC) 10 GM/15ML solution Take 15 mLs (10 g total) by mouth 2 (two) times daily as needed. Patient not taking: Reported on 03/11/2015 12/23/14   Rana Snare, NP  metFORMIN (GLUCOPHAGE) 500 MG tablet  TAKE 1 TABLET (500 MG TOTAL) BY MOUTH 2 (TWO) TIMES DAILY WITH A MEAL. Patient not taking: Reported on 03/11/2015 12/19/14   Junie Spencer, FNP   Allergies  Allergen Reactions  . Penicillins Hives and Rash   CBC:  Component Value Date/Time   WBC 2.5* 03/16/2015 0600   WBC 0.8* 03/11/2015 1145   WBC 6.1 09/03/2013 1157   HGB 9.4* 03/16/2015 0600   HGB 8.1* 03/11/2015 1145   HGB 12.3* 09/03/2013 1157   HCT 26.0* 03/16/2015 0600   HCT 23.2* 03/11/2015 1145   HCT 38.4* 09/03/2013 1157   PLT 19* 03/16/2015 0600   PLT 16* 03/11/2015 1145   MCV 88.4 03/16/2015 0600   MCV 99.6* 03/11/2015 1145   MCV 87.8 09/03/2013 1157   NEUTROABS 0.6* 03/16/2015 0600   NEUTROABS 0.0* 03/11/2015 1145   LYMPHSABS 1.1 03/16/2015 0600   LYMPHSABS 0.5* 03/11/2015 1145   MONOABS 0.7 03/16/2015 0600   MONOABS 0.2 03/11/2015 1145   EOSABS 0.0 03/16/2015 0600   EOSABS 0.0 03/11/2015 1145   BASOSABS 0.0 03/16/2015 0600   BASOSABS 0.0 03/11/2015 1145   Comprehensive Metabolic Panel:    Component Value Date/Time   NA 132* 03/16/2015 0600   NA 126* 03/11/2015 1145   NA 135 05/21/2014 1345   K 3.5 03/16/2015 0600   K 4.4 Sl. Hemolysis 03/11/2015 1145   CL 101 03/16/2015 0600   CL 106 12/28/2012 1059   CO2 22 03/16/2015 0600   CO2 21* 03/11/2015 1145   BUN 19 03/16/2015 0600   BUN 16.1 03/11/2015 1145   BUN 24 05/21/2014 1345   CREATININE 0.88 03/16/2015 0600   CREATININE 1.2 03/11/2015 1145   CREATININE 0.89 01/31/2013 1217   GLUCOSE 82 03/16/2015 0600   GLUCOSE 122 03/11/2015 1145   GLUCOSE 157* 05/21/2014 1345   GLUCOSE 148* 12/28/2012 1059   CALCIUM 7.7* 03/16/2015 0600   CALCIUM 8.7 03/11/2015 1145   AST 22 03/11/2015 1145   AST 13 05/21/2014 1345   ALT 15 03/11/2015 1145   ALT 9 05/21/2014 1345   ALKPHOS 45 03/11/2015 1145   ALKPHOS 61 05/21/2014 1345   BILITOT 0.73 03/11/2015 1145   BILITOT 0.7 05/21/2014 1345   PROT 8.2 03/11/2015 1145   PROT 7.7 05/21/2014 1345   PROT 6.2  08/08/2013 0940   ALBUMIN 2.6* 03/11/2015 1145   ALBUMIN 3.2* 08/08/2013 0940    Physical Exam: Vital Signs: BP 158/66 mmHg  Pulse 83  Temp(Src) 98.9 F (37.2 C) (Oral)  Resp 21  Ht 6' (1.829 m)  Wt 107 kg (235 lb 14.3 oz)  BMI 31.99 kg/m2  SpO2 100% SpO2: SpO2: 100 % O2 Device: O2 Device: Nasal Cannula O2 Flow Rate: O2 Flow Rate (L/min): 2 L/min Intake/output summary:  Intake/Output Summary (Last 24 hours) at 03/16/15 2019 Last data filed at 03/16/15 1900  Gross per 24 hour  Intake   1760 ml  Output   1200 ml  Net    560 ml   LBM: Last BM Date: 03/15/15 Baseline Weight: Weight: 101.152 kg (223 lb) Most recent weight: Weight: 107 kg (235 lb 14.3 oz)  Exam Findings:  General: Alert, awake, in no acute distress.  HEENT: No bruits, no goiter, no JVD Heart: Regular rate and rhythm. No murmur appreciated. Lungs: Good air movement, clear other than crackles at bases  Abdomen: Soft, nontender, nondistended, positive bowel sounds.  Ext: Edema right upper extremity and right lower extremity  Skin: warm and dry Neuro: Grossly intact, nonfocal.         Palliative Performance Scale:40               Additional Data Reviewed: Recent Labs     03/15/15  0600  03/15/15  1717  03/16/15  0600  WBC  2.3*   --   2.5*  HGB  9.1*   --   9.4*  PLT  43*   --   19*  NA  124*  129*  132*  BUN  19  22*  19  CREATININE  0.80  0.96  0.88     Time In: 130  Time Out: 230 Time Total: 60 Greater than 50%  of this time was spent counseling and coordinating care related to the above assessment and plan.  Signed by: Micheline Rough, MD  Micheline Rough, MD  03/16/2015, 8:19 PM  Please contact Palliative Medicine Team phone at [phone] for questions and concerns.

## 2015-03-17 LAB — BASIC METABOLIC PANEL
ANION GAP: 7 (ref 5–15)
BUN: 18 mg/dL (ref 6–20)
CHLORIDE: 102 mmol/L (ref 101–111)
CO2: 23 mmol/L (ref 22–32)
Calcium: 7.8 mg/dL — ABNORMAL LOW (ref 8.9–10.3)
Creatinine, Ser: 0.84 mg/dL (ref 0.61–1.24)
GFR calc Af Amer: >60 mL/min (ref >60)
GFR calc non Af Amer: >60 mL/min (ref >60)
Glucose, Bld: 132 mg/dL — ABNORMAL HIGH (ref 65–99)
POTASSIUM: 3.7 mmol/L (ref 3.5–5.1)
SODIUM: 132 mmol/L — AB (ref 135–145)

## 2015-03-17 LAB — GLUCOSE, CAPILLARY
GLUCOSE-CAPILLARY: 211 mg/dL — AB (ref 65–99)
GLUCOSE-CAPILLARY: 219 mg/dL — AB (ref 65–99)
GLUCOSE-CAPILLARY: 210 mg/dL — AB (ref 65–99)
Glucose-Capillary: 162 mg/dL — ABNORMAL HIGH (ref 65–99)

## 2015-03-17 LAB — CBC WITH DIFFERENTIAL/PLATELET
BLASTS: 20 %
Band Neutrophils: 0 % (ref 0–10)
Basophils Absolute: 0 10*3/uL (ref 0.0–0.1)
Basophils Relative: 0 % (ref 0–1)
EOS ABS: 0 10*3/uL (ref 0.0–0.7)
Eosinophils Relative: 1 % (ref 0–5)
HCT: 23.4 % — ABNORMAL LOW (ref 39.0–52.0)
HEMOGLOBIN: 8.5 g/dL — AB (ref 13.0–17.0)
LYMPHS PCT: 36 % (ref 12–46)
Lymphs Abs: 0.8 10*3/uL (ref 0.7–4.0)
MCH: 31.7 pg (ref 26.0–34.0)
MCHC: 36.3 g/dL — ABNORMAL HIGH (ref 30.0–36.0)
MCV: 87.3 fL (ref 78.0–100.0)
Metamyelocytes Relative: 0 %
Monocytes Absolute: 0.2 10*3/uL (ref 0.1–1.0)
Monocytes Relative: 8 % (ref 3–12)
Myelocytes: 0 %
NEUTROS PCT: 35 % — AB (ref 43–77)
NRBC: 0 /100{WBCs}
Neutro Abs: 0.8 10*3/uL — ABNORMAL LOW (ref 1.7–7.7)
OTHER: 0 %
PROMYELOCYTES ABS: 0 %
Platelets: 9 10*3/uL — CL (ref 150–400)
RBC: 2.68 MIL/uL — ABNORMAL LOW (ref 4.22–5.81)
RDW: 19.6 % — ABNORMAL HIGH (ref 11.5–15.5)
WBC: 2.2 10*3/uL — ABNORMAL LOW (ref 4.0–10.5)

## 2015-03-17 MED ORDER — HYDROCORTISONE NA SUCCINATE PF 100 MG IJ SOLR
50.0000 mg | Freq: Two times a day (BID) | INTRAMUSCULAR | Status: DC
  Administered 2015-03-17 (×2): 50 mg via INTRAVENOUS
  Filled 2015-03-17 (×2): qty 2

## 2015-03-17 MED ORDER — HEPARIN SOD (PORK) LOCK FLUSH 100 UNIT/ML IV SOLN
250.0000 [IU] | INTRAVENOUS | Status: DC | PRN
  Filled 2015-03-17: qty 3

## 2015-03-17 MED ORDER — HEPARIN SOD (PORK) LOCK FLUSH 100 UNIT/ML IV SOLN
500.0000 [IU] | Freq: Every day | INTRAVENOUS | Status: DC | PRN
  Filled 2015-03-17: qty 5

## 2015-03-17 MED ORDER — SODIUM CHLORIDE 0.9 % IJ SOLN: 10.0000 mL | INTRAMUSCULAR | Status: DC | PRN

## 2015-03-17 MED ORDER — SODIUM CHLORIDE 0.9 % IV SOLN: 250.0000 mL | Freq: Once | INTRAVENOUS | Status: DC

## 2015-03-17 MED ORDER — POTASSIUM CHLORIDE CRYS ER 20 MEQ PO TBCR
40.0000 meq | EXTENDED_RELEASE_TABLET | Freq: Once | ORAL | Status: AC
  Administered 2015-03-17: 40 meq via ORAL
  Filled 2015-03-17: qty 2

## 2015-03-17 MED ORDER — FUROSEMIDE 20 MG PO TABS
20.0000 mg | ORAL_TABLET | Freq: Every day | ORAL | Status: DC
  Administered 2015-03-17: 20 mg via ORAL
  Filled 2015-03-17: qty 1

## 2015-03-17 MED ORDER — SODIUM CHLORIDE 0.9 % IJ SOLN: 3.0000 mL | INTRAMUSCULAR | Status: DC | PRN

## 2015-03-17 NOTE — Progress Notes (Addendum)
Inpatient Diabetes Program Recommendations  AACE/ADA: New Consensus Statement on Inpatient Glycemic Control (2013)  Target Ranges:  Prepandial:   less than 140 mg/dL      Peak postprandial:   less than 180 mg/dL (1-2 hours)      Critically ill patients:  140 - 180 mg/dL   Results for RASHED, EDLER (MRN [id number]) as of 03/18/2015 07:46  Ref. Range 03/17/2015 07:39 03/17/2015 11:50 03/17/2015 16:54 03/17/2015 21:19  Glucose-Capillary Latest Ref Range: 65-99 mg/dL 162 (H) 211 (H) 219 (H) 210 (H)     Admit with: Sepsis  History: DM, Multiple Myeloma  Home DM Meds: Invokana 100 mg daily       Metformin 500 mg bid (pt not taking per pt report)  Current DM Orders: Novolog Moderate SSI (0-15 units) TID AC + HS     MD- Please consider the following in-hospital insulin adjustments if within goals of care for patient:  1. Start low dose Novolog Meal Coverage while home oral DM medications on hold-  Novolog 3 units tid with meals  2. Change PO diet to Carbohydrate Modified diet (currently ordered as Regular diet with no carbohydrate restrictions)     Will follow Wyn Quaker RN, MSN, CDE Diabetes Coordinator Inpatient Glycemic Control Team Team Pager: [phone] (8a-5p)

## 2015-03-17 NOTE — Progress Notes (Signed)
I met briefly with the patient and his wife again today. I attempted to speak with him again regarding his goals moving forward. He reported, "I have all the information I need" and did not want to discuss further today.  We will continue to follow-up with patient.  Micheline Rough, MD [place] Team [phone]

## 2015-03-17 NOTE — Progress Notes (Signed)
ANTIBIOTIC CONSULT NOTE - Follow up  Pharmacy Consult for Vancomycin and Aztreonam Indication: Febrile neutropenia  Allergies  Allergen Reactions  . Penicillins Hives and Rash    Patient Measurements: Height: 6' (182.9 cm) Weight: 235 lb 14.3 oz (107 kg) IBW/kg (Calculated) : 77.6 Adjusted Body Weight:   Vital Signs: Temp: 98.2 F (36.8 C) (09/05 0800) Temp Source: Oral (09/05 0800) BP: 171/62 mmHg (09/05 0600) Pulse Rate: 109 (09/05 0900) Intake/Output from previous day: 09/04 0701 - 09/05 0700 In: 1830 [P.O.:570; I.V.:230; IV Piggyback:800] Out: 2050 [Urine:2050] Intake/Output from this shift: Total I/O In: 300 [P.O.:240; I.V.:30; Other:30] Out: -   Labs:  Recent Labs  03/15/15 0600 03/15/15 1717 03/16/15 0600 03/17/15 0520  WBC 2.3*  --  2.5* 2.2*  HGB 9.1*  --  9.4* 8.5*  PLT 43*  --  19* 9*  CREATININE 0.80 0.96 0.88 0.84   Estimated Creatinine Clearance: 93.1 mL/min (by C-G formula based on Cr of 0.84).  Recent Labs  03/14/15 1345  VANCOTROUGH 16     Microbiology: Recent Results (from the past 720 hour(s))  TECHNOLOGIST REVIEW     Status: None   Collection Time: 03/10/15  8:36 AM  Result Value Ref Range Status   Technologist Review 2% Blast  Final  Urine Culture     Status: None   Collection Time: 03/11/15 11:45 AM  Result Value Ref Range Status   Urine Culture, Routine Culture, Urine  Final    Comment: Final - ===== COLONY COUNT: ===== NO GROWTH NO GROWTH   Culture, blood (routine x 2)     Status: None   Collection Time: 03/11/15  3:23 PM  Result Value Ref Range Status   Specimen Description BLOOD LEFT WRIST  Final   Special Requests IN PEDIATRIC BOTTLE 3CC  Final   Culture   Final    NO GROWTH 5 DAYS Performed at [facility]    Report Status 03/16/2015 FINAL  Final  Culture, blood (routine x 2)     Status: None   Collection Time: 03/11/15  3:44 PM  Result Value Ref Range Status   Specimen Description BLOOD LEFT ANTECUBITAL   Final   Special Requests BOTTLES DRAWN AEROBIC AND ANAEROBIC 5CC  Final   Culture   Final    NO GROWTH 5 DAYS Performed at [facility]    Report Status 03/16/2015 FINAL  Final  Urine culture     Status: None   Collection Time: 03/11/15  5:18 PM  Result Value Ref Range Status   Specimen Description URINE, CLEAN CATCH  Final   Special Requests NONE  Final   Culture   Final    3,000 COLONIES/mL INSIGNIFICANT GROWTH Performed at [facility]    Report Status 03/12/2015 FINAL  Final  MRSA PCR Screening     Status: None   Collection Time: 03/11/15  6:28 PM  Result Value Ref Range Status   MRSA by PCR NEGATIVE NEGATIVE Final    Comment:        The GeneXpert MRSA Assay (FDA approved for NASAL specimens only), is one component of a comprehensive MRSA colonization surveillance program. It is not intended to diagnose MRSA infection nor to guide or monitor treatment for MRSA infections.   Urine culture     Status: None   Collection Time: 03/11/15  9:30 PM  Result Value Ref Range Status   Specimen Description URINE, CLEAN CATCH  Final   Special Requests NONE  Final  Culture   Final    NO GROWTH 1 DAY Performed at [facility]    Report Status 03/13/2015 FINAL  Final    Medical History: Past Medical History  Diagnosis Date  . GERD (gastroesophageal reflux disease)   . Essential hypertension, benign   . BPH (benign prostatic hyperplasia)   . Lumbar herniated disc     L4-5  . Hyperlipidemia   . Sleep apnea   . Multiple myeloma 02/19/2008  . Arthritis   . Diverticulosis   . Peptic ulcer disease   . Tubular adenoma 02/16/2008    Dr. Silvano Rusk  . MDS (myelodysplastic syndrome) 06/17/2014  . Diabetes mellitus without complication     Medications:  Anti-infectives    Start     Dose/Rate Route Frequency Ordered Stop   03/16/15 1000  fluconazole (DIFLUCAN) IVPB 100 mg     100 mg 50 mL/hr over 60 Minutes Intravenous Every 24 hours 03/16/15 0826      03/13/15 1130  acyclovir (ZOVIRAX) tablet 400 mg     400 mg Oral 2 times daily 03/13/15 1007     03/13/15 0600  vancomycin (VANCOCIN) IVPB 1000 mg/200 mL premix     1,000 mg 200 mL/hr over 60 Minutes Intravenous Every 8 hours 03/13/15 0526     03/12/15 0600  vancomycin (VANCOCIN) IVPB 1000 mg/200 mL premix  Status:  Discontinued     1,000 mg 200 mL/hr over 60 Minutes Intravenous Every 12 hours 03/11/15 1529 03/13/15 0526   03/11/15 1600  aztreonam (AZACTAM) 2 g in dextrose 5 % 50 mL IVPB     2 g 100 mL/hr over 30 Minutes Intravenous 3 times per day 03/11/15 1529     03/11/15 1600  vancomycin (VANCOCIN) 1,500 mg in sodium chloride 0.9 % 500 mL IVPB     1,500 mg 250 mL/hr over 120 Minutes Intravenous  Once 03/11/15 1529 03/11/15 1826     Assessment: 79[facility] M from the [facility] with suspected early sepsis, febrile, tachycardic, and neutropenic. Possible tooth infection although the patient reports this feels better after recent antibiotics. He has been on Cipro since 02/21/15. Pharmacy is asked to dose Vancomycin and Aztreonam (penicillin allergic) for febrile neutropenia, sepsis likely secondary to HCAP. Subperiosteal abscess and severe dental caries on CT mxf, now s/p dental tooth #18 extraction on 9/2.   Antibiotics:  8/30 >> Vanc >> 8/30 >> Aztreonam >>   9/1 resumed PTA acyclovir (may adjust per MD, dose ok for now) 9/4 >> Fluconazole >>  Vitals/Labs: Temp: 100.2 (103.2 on 8/31) WBC: neutropenic, trending up, ANC 800 (Granix 8/30 >> ) Renal: SCr wnl; CrCl 89, CG, 74 N  Microbiology: 8/30 blood: NGTD 8/30 urine: 3k insignificant growth 8/30 urine repeat: NGF S. pneumo UAg: neg Legionella UAg: neg  Dose changes/levels: 9/1 0500 VT: 9 on 1 g q12 9/2 1330 VT: 16 on 1g q8h  Goal of Therapy:  Vancomycin trough level 15-20 mcg/ml  Appropriate antibiotic dosing for renal function; eradication of infection  Plan:  Continue Vancomycin 1g IV q8h  Continue Aztreonam 2g IV  q8h. Fluconazole $RemoveBeforeDE'100mg'[id number]$  IV q24h per MD - dose ok for renal function Follow up renal fxn, culture results, and clinical course.  Ralene Bathe, PharmD, BCPS 03/17/2015, 10:39 AM  Pager: [phone]

## 2015-03-17 NOTE — Progress Notes (Signed)
CRITICAL VALUE ALERT  Critical value received:  Platelets -9   Date of notification:  03/17/15  Time of notification:  07:32  Critical value read back:Yes.    Nurse who received alert:  Irene Pap, RN  MD notified (1st page):  Dr. Tyrell Antonio  Time of first page:  07:33  MD notified (2nd page):  Time of second page:  Responding MD:  Dr. Tyrell Antonio  Time MD responded:  07:35

## 2015-03-17 NOTE — Progress Notes (Signed)
[facility] PROGRESS NOTE  [name] DIY:[id number] DOB: Feb 16, 1936 DOA: 03/11/2015 PCP: Sharion Balloon, FNP  Assessment/Plan: 1-Neutropenic fever/SEPSIS: Likely secondary to HCAP from ongoing chemotherapy and tooth infection. Lactic acid trending down.  Continue with IV antibiotics.  Continue with Granix.  On steroid stress dose.  Spike fever  9-04. Chest x ray with no active diseases.UA negative for infection.  Started  Fluconazole 9-04 . He has a soft tissue opacification in the superior nasal cavity.  Afebrile today.   Dental infection: - Remaining first left mandibular molar very loose with significant surrounding erythema but no appreciable fluctuance.  - antibiotics as above -CT maxillofacial: Lateral soft tissue swelling adjacent to the residual left mandibular molar representing early or resolving subperiosteal abscess.  -patient S/P Extraction of tooth #18 with alveoloplasty -tolerated procedure well.   DVT right popliteal vein;  No candidate for anticoagulation.  S/P infrarenal IVC filter 9-03  Right Arm swelling;  Doppler positive for RUE DVT. Not a candidate for anticoagulation.   Hyponatremia: 126 on admission. Previous 128 on 8/26 and 131 on 8/22.  Worsening hyponatremia. He has received IV fluids. Suspect now hypervolemic hyponatremia/  Stop IV fluids.  Sodium improved after IV lasix.   Multiple Myeloma: = Last chemo 5 days prior to admission. All hematologic cell lines down. - continue Decadron Qwkly (Friday) on hold. On stress steroids dose.  - Dr Benay Spice and Marin Olp following.  -thrombocytopenia, oncologist was contacted regarding thrombocytopenia./   DM: Will hold  invokana and metformin - A1c - SSI  Chronic diastolic CHF: Last echo showing EF of 07-68% grade 1 diastolic dysfunction. One time dose of lasix.   ANxiety/insomnia: - continue Klonopin  BPH: - continue flomax  D-dimer ; V-Q scan negative. Doppler right LE DVT popliteal vein.     Code Status: Full Code.  Family Communication: Care discussed with patient.  Disposition Plan: transfer out of step down in 24 hours.    Consultants:  Dr Learta Codding  Procedures:  Right external Jugular.   Antibiotics:  Vancomycin 8-30  Aztreonam 8-30  Fluconazole 9-04  HPI/Subjective: He is feeling ok, just had a BM. No evidence of active bleeding.    Objective: Filed Vitals:   03/17/15 0600  BP: 171/62  Pulse: 80  Temp:   Resp: 19    Intake/Output Summary (Last 24 hours) at 03/17/15 0738 Last data filed at 03/17/15 0631  Gross per 24 hour  Intake   1830 ml  Output   2050 ml  Net   -220 ml   Filed Weights   03/11/15 1635 03/14/15 0438  Weight: 101.152 kg (223 lb) 107 kg (235 lb 14.3 oz)    Exam:   General:  Alert in no distress.  Cardiovascular: S 1, S 2 RRR  Respiratory: crackles at bases.   Abdomen: Bs present, soft, nt  Musculoskeletal: right upper extremity edema, right Lower extremity edema.   Data Reviewed: Basic Metabolic Panel:  Recent Labs Lab 03/14/15 0511 03/15/15 0600 03/15/15 1717 03/16/15 0600 03/17/15 0520  NA 128* 124* 129* 132* 132*  K 4.0 3.8 3.9 3.5 3.7  CL 101 99* 101 101 102  CO2 21* 20* $Remov'23 22 23  'AEuagn$ GLUCOSE 127* 125* 211* 82 132*  BUN 17 19 22* 19 18  CREATININE 0.90 0.80 0.96 0.88 0.84  CALCIUM 7.4* 7.0* 7.4* 7.7* 7.8*   Liver Function Tests:  Recent Labs Lab 03/11/15 1145  AST 22  ALT 15  ALKPHOS 45  BILITOT 0.73  PROT 8.2  ALBUMIN 2.6*   No results for input(s): LIPASE, AMYLASE in the last 168 hours. No results for input(s): AMMONIA in the last 168 hours. CBC:  Recent Labs Lab 03/13/15 0403 03/14/15 0511 03/15/15 0600 03/16/15 0600 03/17/15 0520  WBC 1.6* 2.1* 2.3* 2.5* 2.2*  NEUTROABS 0.2* 0.7* 0.7* 0.6* PENDING  HGB 8.5* 8.4* 9.1* 9.4* 8.5*  HCT 23.3* 22.9* 24.7* 26.0* 23.4*  MCV 88.6 87.7 88.2 88.4 87.3  PLT 44* 36* 43* 19* 9*   Cardiac Enzymes: No results for input(s): CKTOTAL,  CKMB, CKMBINDEX, TROPONINI in the last 168 hours. BNP (last 3 results) No results for input(s): BNP in the last 8760 hours.  ProBNP (last 3 results) No results for input(s): PROBNP in the last 8760 hours.  CBG:  Recent Labs Lab 03/15/15 2156 03/16/15 0832 03/16/15 1246 03/16/15 1713 03/16/15 2240  GLUCAP 238* 108* 131* 218* 241*    Recent Results (from the past 240 hour(s))  TECHNOLOGIST REVIEW     Status: None   Collection Time: 03/10/15  8:36 AM  Result Value Ref Range Status   Technologist Review 2% Blast  Final  Urine Culture     Status: None   Collection Time: 03/11/15 11:45 AM  Result Value Ref Range Status   Urine Culture, Routine Culture, Urine  Final    Comment: Final - ===== COLONY COUNT: ===== NO GROWTH NO GROWTH   Culture, blood (routine x 2)     Status: None   Collection Time: 03/11/15  3:23 PM  Result Value Ref Range Status   Specimen Description BLOOD LEFT WRIST  Final   Special Requests IN PEDIATRIC BOTTLE 3CC  Final   Culture   Final    NO GROWTH 5 DAYS Performed at [facility]    Report Status 03/16/2015 FINAL  Final  Culture, blood (routine x 2)     Status: None   Collection Time: 03/11/15  3:44 PM  Result Value Ref Range Status   Specimen Description BLOOD LEFT ANTECUBITAL  Final   Special Requests BOTTLES DRAWN AEROBIC AND ANAEROBIC 5CC  Final   Culture   Final    NO GROWTH 5 DAYS Performed at [facility]    Report Status 03/16/2015 FINAL  Final  Urine culture     Status: None   Collection Time: 03/11/15  5:18 PM  Result Value Ref Range Status   Specimen Description URINE, CLEAN CATCH  Final   Special Requests NONE  Final   Culture   Final    3,000 COLONIES/mL INSIGNIFICANT GROWTH Performed at [facility]    Report Status 03/12/2015 FINAL  Final  MRSA PCR Screening     Status: None   Collection Time: 03/11/15  6:28 PM  Result Value Ref Range Status   MRSA by PCR NEGATIVE NEGATIVE Final    Comment:         The GeneXpert MRSA Assay (FDA approved for NASAL specimens only), is one component of a comprehensive MRSA colonization surveillance program. It is not intended to diagnose MRSA infection nor to guide or monitor treatment for MRSA infections.   Urine culture     Status: None   Collection Time: 03/11/15  9:30 PM  Result Value Ref Range Status   Specimen Description URINE, CLEAN CATCH  Final   Special Requests NONE  Final   Culture   Final    NO GROWTH 1 DAY Performed at [facility]    Report Status 03/13/2015 FINAL  Final  Studies: Ir Ivc Filter Plmt / S&i /img Guid/mod Sed  03/15/2015   INDICATION: 79 year old male with acute right popliteal and right brachial DVT in the setting of multiple myeloma, myelodysplastic syndrome and thrombocytopenia. He is not a candidate for anticoagulation therapy and therefore caval interruption is warranted for PE prophylaxis.  EXAM: ULTRASOUND GUIDANCE FOR VASCULARACCESS  IVC CATHETERIZATION AND VENOGRAM  IVC FILTER INSERTION  COMPARISON:  None.  MEDICATIONS: Fentanyl 50 mcg IV; Versed 1 mg IV  ANESTHESIA/SEDATION: Sedation Time  11 minutes  CONTRAST:  47mL OMNIPAQUE IOHEXOL 300 MG/ML  SOLN  FLUOROSCOPY TIME:  1 minutes 24 seconds  026 mGy  COMPLICATIONS: None immediate  PROCEDURE: Informed consent was obtained from the patient following explanation of the procedure, risks, benefits and alternatives. The patient understands, agrees and consents for the procedure. All questions were addressed. A time out was performed prior to the initiation of the procedure.  Maximal barrier sterile technique utilized including caps, mask, sterile gowns, sterile gloves, large sterile drape, hand hygiene, and Betadine prep.  Under sterile condition and local anesthesia, right internal jugular venous access was performed with ultrasound. An ultrasound image was saved and sent to PACS. Over a guidewire, the IVC filter delivery sheath and inner dilator were advanced  into the IVC just above the IVC bifurcation. Contrast injection was performed for an IVC venogram.  Through the delivery sheath, a retrievable Denali IVC filter was deployed below the level of the renal veins and above the IVC bifurcation. Limited post deployment venacavagram was performed.  The delivery sheath was removed and hemostasis was obtained with manual compression. A dressing was placed. The patient tolerated the procedure well without immediate post procedural complication.  FINDINGS: The IVC is patent. No evidence of thrombus, stenosis, or occlusion. No variant venous anatomy. Successful placement of the IVC filter below the level of the renal veins.  IMPRESSION: Successful ultrasound and fluoroscopically guided placement of an infrarenal retrievable IVC filter via right jugular approach.  Due to patient related comorbidities and/or clinical necessity, this IVC filter should be considered a permanent device. This patient will not be actively followed for future filter retrieval.  Signed,  Criselda Peaches, MD  [facility]  [facility]   Electronically Signed   By: Jacqulynn Cadet M.D.   On: 03/15/2015 11:46   Dg Chest Port 1 View  03/16/2015   CLINICAL DATA:  Generalized pain.  Hypertension.  EXAM: PORTABLE CHEST - 1 VIEW  COMPARISON:  03/11/2015  FINDINGS: There is a left upper extremity PICC line with tip in the low SVC. The lungs are grossly clear. There is no large effusion. Pulmonary vasculature is normal.  IMPRESSION: No active disease.   Electronically Signed   By: Andreas Newport M.D.   On: 03/16/2015 05:50    Scheduled Meds: . sodium chloride   Intravenous Once  . acyclovir  400 mg Oral BID  . atorvastatin  20 mg Oral q1800  . aztreonam  2 g Intravenous 3 times per day  . diclofenac sodium  2 g Topical QID  . fluconazole (DIFLUCAN) IV  100 mg Intravenous Q24H  . hydrocortisone sod succinate (SOLU-CORTEF) inj  100 mg Intravenous  Q12H  . insulin aspart  0-15 Units Subcutaneous TID WC  . insulin aspart  0-5 Units Subcutaneous QHS  . pantoprazole  40 mg Oral BID  . polyethylene glycol  17 g Oral BID  . sodium chloride  10-40 mL Intracatheter Q12H  . sodium chloride  3 mL Intravenous Q12H  . tamsulosin  0.4 mg Oral QPC supper  . Tbo-Filgrastim  480 mcg Subcutaneous Q24H  . vancomycin  1,000 mg Intravenous Q8H   Continuous Infusions: . lactated ringers 10 mL/hr at 03/14/15 1116    Principal Problem:   Sepsis Active Problems:   GERD (gastroesophageal reflux disease)   Neutropenic fever   Chronic diastolic congestive heart failure   Hyponatremia   Dental infection   Diabetes mellitus with complication   Anxiety state   Thrombocytopenia   DVT (deep venous thrombosis)   Palliative care encounter   Goals of care, counseling/discussion    Time spent: 35 minutes.     Niel Hummer A  [facility] Pager [phone]. If 7PM-7AM, please contact night-coverage at [email or web address], password [email or web address] 03/17/2015, 7:38 AM  LOS: 6 days

## 2015-03-17 NOTE — Progress Notes (Signed)
Marland Kitchen   HEMATOLOGY/ONCOLOGY INPATIENT PROGRESS NOTE  Date of Service: 03/17/2015  Inpatient Attending: .Elmarie Shiley, MD   SUBJECTIVE  [name] was seen this afternoon for follow-up. He spiked a fever yesterday but no fever since this morning. No issues with bleeding. Has some intermittent cough. Continues to be an IV antibiotics and status post steroids. Fluconazole was added to his antibiotics yesterday. No obvious bleeding from his tooth extraction site. Has some mild leg swelling from his right popliteal DVT but no significant pain. Also some right upper extremity discomfort and swelling due to right upper extremity DVT. Not a candidate for anticoagulation. His platelet counts have dropped to 9000 today and he gave verbal consent for platelet transfusion orders were placed for one apheresis unit of irradiated platelets- ordered confirmed with blood Bank   OBJECTIVE:  Patient notes no acute distress  PHYSICAL EXAMINATION: . Filed Vitals:   03/17/15 1000 03/17/15 1100 03/17/15 1200 03/17/15 1220  BP:    164/57  Pulse: 105 97 95 93  Temp:   98.8 F (37.1 C)   TempSrc:   Oral   Resp: $Remo'20 23 23 6  'TNyfP$ Height:      Weight:      SpO2: 98% 97% 95% 96%   Filed Weights   03/11/15 1635 03/14/15 0438  Weight: 223 lb (101.152 kg) 235 lb 14.3 oz (107 kg)   .Body mass index is 31.99 kg/(m^2).  GENERAL:alert, in no acute distress and comfortable EYES: normal, conjunctiva are pink and non-injected, sclera clear OROPHARYNX  oral mucosa moist NECK: supple, no JVD, thyroid normal size, non-tender, without nodularity LYMPH:  no palpable lymphadenopathy in the cervical, axillary or inguinal LUNGS: clear to auscultation with normal respiratory effort HEART: S1-S2 regular ABDOMEN: abdomen soft, non-tender, normoactive bowel sounds  Musculoskeletal: Right upper extremity and right lower extremity swelling PSYCH: alert & oriented x 3 with fluent speech NEURO: no focal motor/sensory  deficits  MEDICAL HISTORY:  Past Medical History  Diagnosis Date  . GERD (gastroesophageal reflux disease)   . Essential hypertension, benign   . BPH (benign prostatic hyperplasia)   . Lumbar herniated disc     L4-5  . Hyperlipidemia   . Sleep apnea   . Multiple myeloma 02/19/2008  . Arthritis   . Diverticulosis   . Peptic ulcer disease   . Tubular adenoma 02/16/2008    Dr. Silvano Rusk  . MDS (myelodysplastic syndrome) 06/17/2014  . Diabetes mellitus without complication     SURGICAL HISTORY: Past Surgical History  Procedure Laterality Date  . Neck surgery    . Lumbar laminectomy/decompression microdiscectomy  11/01/2011    Procedure: LUMBAR LAMINECTOMY/DECOMPRESSION MICRODISCECTOMY 2 LEVELS;  Surgeon: Charlie Pitter, MD;  Location: [facility];  Service: Neurosurgery;  Laterality: Right;  Lumbar Laminectomy/Microdiscectomy Decompression Lumbar Three-Four, Lumbar Four-Five   . Colonoscopy    . Hemorrhoid banding  2014  . Esophagogastroduodenoscopy      SOCIAL HISTORY: Social History   Social History  . Marital Status: Married    Spouse Name: N/A  . Number of Children: 4  . Years of Education: N/A   Occupational History  . retired-disabled-Construction    Social History Main Topics  . Smoking status: Former Smoker -- 2.00 packs/day for 20 years    Types: Cigarettes    Start date: 09/15/1962    Quit date: 07/12/1990  . Smokeless tobacco: Never Used  . Alcohol Use: 0.0 oz/week    0 Standard drinks or equivalent per week  Comment: "Very seldom"  . Drug Use: No  . Sexual Activity: No   Other Topics Concern  . Not on file   Social History Narrative   Pt was adopted.   Married   Programmer, applications    FAMILY HISTORY: Family History  Problem Relation Age of Onset  . Adopted: Yes    ALLERGIES:  is allergic to penicillins.  MEDICATIONS:  Scheduled Meds: . sodium chloride   Intravenous Once  . acyclovir  400 mg Oral BID  . atorvastatin  20 mg Oral q1800   . aztreonam  2 g Intravenous 3 times per day  . diclofenac sodium  2 g Topical QID  . fluconazole (DIFLUCAN) IV  100 mg Intravenous Q24H  . furosemide  20 mg Oral Daily  . hydrocortisone sod succinate (SOLU-CORTEF) inj  50 mg Intravenous Q12H  . insulin aspart  0-15 Units Subcutaneous TID WC  . insulin aspart  0-5 Units Subcutaneous QHS  . pantoprazole  40 mg Oral BID  . polyethylene glycol  17 g Oral BID  . sodium chloride  10-40 mL Intracatheter Q12H  . sodium chloride  3 mL Intravenous Q12H  . tamsulosin  0.4 mg Oral QPC supper  . Tbo-Filgrastim  480 mcg Subcutaneous Q24H  . vancomycin  1,000 mg Intravenous Q8H   Continuous Infusions: . lactated ringers 10 mL/hr at 03/17/15 0900   PRN Meds:.acetaminophen **OR** acetaminophen, clonazePAM, hydrALAZINE, iohexol, loratadine, morphine injection, oxyCODONE, prochlorperazine, sodium chloride, technetium TC 79M diethylenetriame-pentaacetic acid, traMADol  REVIEW OF SYSTEMS:    10 Point review of Systems was done is negative except as noted above.   LABORATORY DATA:  I have reviewed the data as listed  . CBC Latest Ref Rng 03/17/2015 03/16/2015 03/15/2015  WBC 4.0 - 10.5 K/uL 2.2(L) 2.5(L) 2.3(L)  Hemoglobin 13.0 - 17.0 g/dL 8.5(L) 9.4(L) 9.1(L)  Hematocrit 39.0 - 52.0 % 23.4(L) 26.0(L) 24.7(L)  Platelets 150 - 400 K/uL 9(LL) 19(LL) 43(L)   ANC has appropriate 800.  CBC    Component Value Date/Time   WBC 2.2* 03/17/2015 0520   WBC 0.8* 03/11/2015 1145   WBC 6.1 09/03/2013 1157   RBC 2.68* 03/17/2015 0520   RBC 2.33* 03/11/2015 1145   RBC 4.4* 09/03/2013 1157   HGB 8.5* 03/17/2015 0520   HGB 8.1* 03/11/2015 1145   HGB 12.3* 09/03/2013 1157   HCT 23.4* 03/17/2015 0520   HCT 23.2* 03/11/2015 1145   HCT 38.4* 09/03/2013 1157   PLT 9* 03/17/2015 0520   PLT 16* 03/11/2015 1145   MCV 87.3 03/17/2015 0520   MCV 99.6* 03/11/2015 1145   MCV 87.8 09/03/2013 1157   MCH 31.7 03/17/2015 0520   MCH 34.7* 03/11/2015 1145   MCH 28.2  09/03/2013 1157   MCHC 36.3* 03/17/2015 0520   MCHC 34.9 03/11/2015 1145   MCHC 32.1 09/03/2013 1157   RDW 19.6* 03/17/2015 0520   RDW 22.4* 03/11/2015 1145   LYMPHSABS 0.8 03/17/2015 0520   LYMPHSABS 0.5* 03/11/2015 1145   MONOABS 0.2 03/17/2015 0520   MONOABS 0.2 03/11/2015 1145   EOSABS 0.0 03/17/2015 0520   EOSABS 0.0 03/11/2015 1145   BASOSABS 0.0 03/17/2015 0520   BASOSABS 0.0 03/11/2015 1145     . CMP Latest Ref Rng 03/17/2015 03/16/2015 03/15/2015  Glucose 65 - 99 mg/dL 132(H) 82 211(H)  BUN 6 - 20 mg/dL 18 19 22(H)  Creatinine 0.61 - 1.24 mg/dL 0.84 0.88 0.96  Sodium 135 - 145 mmol/L 132(L) 132(L) 129(L)  Potassium 3.5 -  5.1 mmol/L 3.7 3.5 3.9  Chloride 101 - 111 mmol/L 102 101 101  CO2 22 - 32 mmol/L 23 22 23   Calcium 8.9 - 10.3 mg/dL 7.8(L) 7.7(L) 7.4(L)  Total Protein 6.4 - 8.3 g/dL - - -  Albumin 3.5 - 4.8 g/dL - - -  Total Bilirubin 0.20 - 1.20 mg/dL - - -  Alkaline Phos 40 - 150 U/L - - -  AST 5 - 34 U/L - - -  ALT 0 - 55 U/L - - -     RADIOGRAPHIC STUDIES: I have personally reviewed the radiological images as listed and agreed with the findings in the report. Dg Chest 2 View  03/11/2015   CLINICAL DATA:  Fever with weakness. Possible sepsis from tooth infection. Multiple myeloma. Initial encounter.  EXAM: CHEST  2 VIEW  COMPARISON:  02/28/2015 and 11/21/2014 radiographs.  FINDINGS: The heart size and mediastinal contours are stable. There are slightly lower lung volumes. On today's lateral view, there is increased density in the retrosternal clear space which may correspond with faint lingular opacity on the frontal examination. There is no consolidation or significant pleural effusion. Telemetry leads overlie the chest. Previous lower cervical fusion noted. There are lucencies in the proximal right humerus attributed to the patient's multiple myeloma.  IMPRESSION: Possible developing lingular pneumonia.   Electronically Signed   By: 01/21/2015 M.D.   On:  03/11/2015 15:42   Dg Chest 2 View  02/28/2015   CLINICAL DATA:  Shortness of breath, mid left-sided chest pain for the past week, history of multiple myeloma, diabetes, and remote history of smoking  EXAM: CHEST  2 VIEW  COMPARISON:  PA lateral chest x-ray of Nov 21, 2014  FINDINGS: The lungs are well-expanded with hemidiaphragm flattening. There is no focal infiltrate. There is minimal stable blunting of the right lateral costophrenic angle. The heart and pulmonary vascularity are normal. The mediastinum is normal in width. The bony thorax exhibits no acute abnormality.  IMPRESSION: There is no CHF or pneumonia. Borderline hyperinflation with hemidiaphragm flattening is chronic and may reflect underlying COPD.   Electronically Signed   By: David  Nov 23, 2014 M.D.   On: 02/28/2015 09:52   Ir Ivc Filter Plmt / S&i /img Guid/mod Sed  03/15/2015   INDICATION: 79 year old male with acute right popliteal and right brachial DVT in the setting of multiple myeloma, myelodysplastic syndrome and thrombocytopenia. He is not a candidate for anticoagulation therapy and therefore caval interruption is warranted for PE prophylaxis.  EXAM: ULTRASOUND GUIDANCE FOR VASCULARACCESS  IVC CATHETERIZATION AND VENOGRAM  IVC FILTER INSERTION  COMPARISON:  None.  MEDICATIONS: Fentanyl 50 mcg IV; Versed 1 mg IV  ANESTHESIA/SEDATION: Sedation Time  11 minutes  CONTRAST:  66mL OMNIPAQUE IOHEXOL 300 MG/ML  SOLN  FLUOROSCOPY TIME:  1 minutes 24 seconds  173 mGy  COMPLICATIONS: None immediate  PROCEDURE: Informed consent was obtained from the patient following explanation of the procedure, risks, benefits and alternatives. The patient understands, agrees and consents for the procedure. All questions were addressed. A time out was performed prior to the initiation of the procedure.  Maximal barrier sterile technique utilized including caps, mask, sterile gowns, sterile gloves, large sterile drape, hand hygiene, and Betadine prep.  Under sterile  condition and local anesthesia, right internal jugular venous access was performed with ultrasound. An ultrasound image was saved and sent to PACS. Over a guidewire, the IVC filter delivery sheath and inner dilator were advanced into the IVC just above the IVC bifurcation.  Contrast injection was performed for an IVC venogram.  Through the delivery sheath, a retrievable Denali IVC filter was deployed below the level of the renal veins and above the IVC bifurcation. Limited post deployment venacavagram was performed.  The delivery sheath was removed and hemostasis was obtained with manual compression. A dressing was placed. The patient tolerated the procedure well without immediate post procedural complication.  FINDINGS: The IVC is patent. No evidence of thrombus, stenosis, or occlusion. No variant venous anatomy. Successful placement of the IVC filter below the level of the renal veins.  IMPRESSION: Successful ultrasound and fluoroscopically guided placement of an infrarenal retrievable IVC filter via right jugular approach.  Due to patient related comorbidities and/or clinical necessity, this IVC filter should be considered a permanent device. This patient will not be actively followed for future filter retrieval.  Signed,  Criselda Peaches, MD  [facility]  [facility]   Electronically Signed   By: Jacqulynn Cadet M.D.   On: 03/15/2015 11:46   Nm Pulmonary Perf And Vent  03/12/2015   CLINICAL DATA:  Difficulty breathing  EXAM: NUCLEAR MEDICINE VENTILATION - PERFUSION LUNG SCAN  Views: Anterior, posterior, left lateral, right lateral, RPO, LPO, RAO, LAO -ventilation and perfusion  Radionuclide: Technetium 46m DTPA -ventilation; Technetium 24m macroaggregated albumin-perfusion  Dose:  42.3 mCi-ventilation; 5.4 mCi-perfusion  Route of administration: Inhalation -ventilation ; intravenous -perfusion  COMPARISON:  Chest radiograph March 11, 2015  FINDINGS:  Ventilation: Radiotracer uptake is homogeneous and symmetric bilaterally.  Perfusion: Radiotracer uptake is homogeneous and symmetric bilaterally.  IMPRESSION: There are no appreciable ventilation or perfusion defects. This study constitutes a very low probability of pulmonary embolus.   Electronically Signed   By: Lowella Grip III M.D.   On: 03/12/2015 12:04   Dg Chest Port 1 View  03/16/2015   CLINICAL DATA:  Generalized pain.  Hypertension.  EXAM: PORTABLE CHEST - 1 VIEW  COMPARISON:  03/11/2015  FINDINGS: There is a left upper extremity PICC line with tip in the low SVC. The lungs are grossly clear. There is no large effusion. Pulmonary vasculature is normal.  IMPRESSION: No active disease.   Electronically Signed   By: Andreas Newport M.D.   On: 03/16/2015 05:50   Ct Maxillofacial Wo Cm  03/12/2015   CLINICAL DATA:  Pain involving the residual left mandibular molar. Neutropenic fever. Sepsis.  EXAM: CT MAXILLOFACIAL WITHOUT CONTRAST  TECHNIQUE: Multidetector CT imaging of the maxillofacial structures was performed. Multiplanar CT image reconstructions were also generated. A small metallic BB was placed on the right temple in order to reliably differentiate right from left.  COMPARISON:  CT of the neck with contrast 09/01/2009.  FINDINGS: A dental caries is noted along the lateral margin of the crown of the residual left mandibular molar. Periapical lucency is noted about the posterior roots. Lateral soft tissue swelling or early subperiosteal abscess formation is noted with an area of low-density measuring 13 x 15 x 6 mm. The mandible is otherwise intact.  A prominent dental caries are noted in the residual right maxillary molar with significant periapical lucency and minimal gas along the lateral margin of the root of the tooth.  Mandible is otherwise intact and located.  Mild mucosal thickening is present along the medial inferior aspect of the left maxillary sinus. There is soft tissue  opacification of anterior left ethmoid air cells and the inferior left frontal sinus. The opacification partially occludes the superior nasal cavity on the left is  well.  The mastoid air cells are clear.  Limited imaging the brain demonstrates mild atrophy, within normal limits for age.  IMPRESSION: 1. Lateral alveolar soft tissue swelling adjacent to the residual left mandibular molar with an area of low-density representing early or resolving subperiosteal abscess as described. 2. Prominent dental caries involving the right maxillary tooth with periapical lucency and some gas formation lateral to the tooth root. 3. Soft tissue opacification anterior ethmoid air cells extending into the superior nasal cavity. While this may represent a benign polyp, neoplasm or early fungal infection is also considered. Recommend ENT consultation for direct visualization. The lesion was not present in 2010.   Electronically Signed   By: San Morelle M.D.   On: 03/12/2015 13:30    ASSESSMENT & PLAN:     1. Multiple myeloma-currently being treated with salvage Daratumumab/Decadron therapy, last treated with week #3 on 03/07/2015  2. Myelodysplasia-treated with 5 azacytidine for progressive pancytopenia, last given June 2016. He was also noted to have some blasts in peripheral blood smear which could be due to G-CSF or preleukemic stage.  3. Severe pancytopenia secondary to multiple myeloma, myelodysplasia, and daratumumab.   4. Fever-likely related to an infected tooth, possible left lung pneumonia noted on an admission chest x-ray 03/11/2015  5. Pain/tenderness and inflammation at a left lower molar, tooth #18  6. Chronic lung disease  7. Poor peripheral IV access  8. Right leg DVT noted on Doppler 03/13/2015 and now with right upper extremity DVT. Patient's thrombocytopenia has precluded any anticoagulation. He had an IVC filter placed.  9. Thrombocytopenia - platelet counts are now down to 9000.  Thrombocytopenia has been multifactorial from his MDS, myeloma, Daratumumab and possibly sepsis/antibiotics.  Plan - continue antibiotics as per the hospitalist service. -Will transfuse 1 unit of pheresed irradiated platelets to maintain counts more than 10,000 . Additional transfusions if bleeding actively. -Not a candidate for anticoagulation given his severe thrombocytopenia at this time  - Increase diet and ambulation as tolerated - Daily CBC/differential -Continue daily Neupogen until [place] more than thousand for 2 days.   Please call with any questions.  The total time spent in the appointment was 25 minutes and more than 50% was on counseling and direct patient cares.    Sullivan Lone MD [place] [facility] Hematology/Oncology Physician [place]  (Office):       [phone] (Work cell):  [phone] (Fax):           [phone]  03/17/2015 1:32 PM

## 2015-03-18 ENCOUNTER — Encounter (HOSPITAL_COMMUNITY): Payer: Self-pay | Admitting: Dentistry

## 2015-03-18 DIAGNOSIS — K08409 Partial loss of teeth, unspecified cause, unspecified class: Secondary | ICD-10-CM

## 2015-03-18 LAB — BASIC METABOLIC PANEL
ANION GAP: 5 (ref 5–15)
BUN: 17 mg/dL (ref 6–20)
CHLORIDE: 100 mmol/L — AB (ref 101–111)
CO2: 26 mmol/L (ref 22–32)
Calcium: 7.8 mg/dL — ABNORMAL LOW (ref 8.9–10.3)
Creatinine, Ser: 0.85 mg/dL (ref 0.61–1.24)
GFR calc Af Amer: >60 mL/min (ref >60)
GFR calc non Af Amer: >60 mL/min (ref >60)
GLUCOSE: 121 mg/dL — AB (ref 65–99)
POTASSIUM: 3.6 mmol/L (ref 3.5–5.1)
Sodium: 131 mmol/L — ABNORMAL LOW (ref 135–145)

## 2015-03-18 LAB — CBC WITH DIFFERENTIAL/PLATELET
BAND NEUTROPHILS: 0 % (ref 0–10)
BASOS ABS: 0 10*3/uL (ref 0.0–0.1)
BASOS PCT: 2 % — AB (ref 0–1)
BASOS PCT: 1 % (ref 0–1)
Band Neutrophils: 0 % (ref 0–10)
Basophils Absolute: 0 10*3/uL (ref 0.0–0.1)
Blasts: 29 %
Blasts: 18 %
EOS ABS: 0 10*3/uL (ref 0.0–0.7)
EOS PCT: 2 % (ref 0–5)
EOS PCT: 1 % (ref 0–5)
Eosinophils Absolute: 0 10*3/uL (ref 0.0–0.7)
HCT: 24.7 % — ABNORMAL LOW (ref 39.0–52.0)
HEMATOCRIT: 26 % — AB (ref 39.0–52.0)
Hemoglobin: 9.1 g/dL — ABNORMAL LOW (ref 13.0–17.0)
Hemoglobin: 9.4 g/dL — ABNORMAL LOW (ref 13.0–17.0)
LYMPHS PCT: 55 % — AB (ref 12–46)
Lymphocytes Relative: 40 % (ref 12–46)
Lymphs Abs: 0.9 10*3/uL (ref 0.7–4.0)
Lymphs Abs: 1.4 10*3/uL (ref 0.7–4.0)
MCH: 32.5 pg (ref 26.0–34.0)
MCH: 32 pg (ref 26.0–34.0)
MCHC: 36.8 g/dL — ABNORMAL HIGH (ref 30.0–36.0)
MCHC: 36.2 g/dL — AB (ref 30.0–36.0)
MCV: 88.2 fL (ref 78.0–100.0)
MCV: 88.4 fL (ref 78.0–100.0)
METAMYELOCYTES PCT: 0 %
MONO ABS: 0.1 10*3/uL (ref 0.1–1.0)
MONO ABS: 0.4 10*3/uL (ref 0.1–1.0)
MONOS PCT: 14 % — AB (ref 3–12)
MYELOCYTES: 0 %
Metamyelocytes Relative: 0 %
Monocytes Relative: 4 % (ref 3–12)
Myelocytes: 0 %
NEUTROS PCT: 23 % — AB (ref 43–77)
NRBC: 0 /100{WBCs}
NRBC: 0 /100{WBCs}
Neutro Abs: 0.5 10*3/uL — ABNORMAL LOW (ref 1.7–7.7)
Neutro Abs: 0.3 10*3/uL — ABNORMAL LOW (ref 1.7–7.7)
Neutrophils Relative %: 11 % — ABNORMAL LOW (ref 43–77)
Other: 0 %
Other: 0 %
PLATELETS: 43 10*3/uL — AB (ref 150–400)
PROMYELOCYTES ABS: 0 %
PROMYELOCYTES ABS: 0 %
Platelets: 19 10*3/uL — CL (ref 150–400)
RBC: 2.8 MIL/uL — AB (ref 4.22–5.81)
RBC: 2.94 MIL/uL — ABNORMAL LOW (ref 4.22–5.81)
RDW: 19.8 % — AB (ref 11.5–15.5)
RDW: 19.9 % — AB (ref 11.5–15.5)
WBC: 2.3 10*3/uL — AB (ref 4.0–10.5)
WBC: 2.5 10*3/uL — ABNORMAL LOW (ref 4.0–10.5)

## 2015-03-18 LAB — GLUCOSE, CAPILLARY
GLUCOSE-CAPILLARY: 116 mg/dL — AB (ref 65–99)
GLUCOSE-CAPILLARY: 159 mg/dL — AB (ref 65–99)
Glucose-Capillary: 146 mg/dL — ABNORMAL HIGH (ref 65–99)
Glucose-Capillary: 158 mg/dL — ABNORMAL HIGH (ref 65–99)

## 2015-03-18 LAB — SAVE SMEAR

## 2015-03-18 LAB — CBC
HEMATOCRIT: 23.1 % — AB (ref 39.0–52.0)
HEMOGLOBIN: 8.4 g/dL — AB (ref 13.0–17.0)
MCH: 31.9 pg (ref 26.0–34.0)
MCHC: 36.4 g/dL — ABNORMAL HIGH (ref 30.0–36.0)
MCV: 87.8 fL (ref 78.0–100.0)
Platelets: 13 10*3/uL — CL (ref 150–400)
RBC: 2.63 MIL/uL — AB (ref 4.22–5.81)
RDW: 19.7 % — ABNORMAL HIGH (ref 11.5–15.5)
WBC: 2.2 10*3/uL — ABNORMAL LOW (ref 4.0–10.5)

## 2015-03-18 LAB — PREPARE PLATELET PHERESIS: UNIT DIVISION: 0

## 2015-03-18 MED ORDER — FUROSEMIDE 10 MG/ML IJ SOLN
20.0000 mg | Freq: Once | INTRAMUSCULAR | Status: AC
  Administered 2015-03-18: 20 mg via INTRAVENOUS
  Filled 2015-03-18: qty 2

## 2015-03-18 MED ORDER — ACETAMINOPHEN 500 MG PO TABS
1000.0000 mg | ORAL_TABLET | Freq: Once | ORAL | Status: AC
  Administered 2015-03-18: 1000 mg via ORAL
  Filled 2015-03-18: qty 2

## 2015-03-18 MED ORDER — ACETAMINOPHEN 325 MG PO TABS
650.0000 mg | ORAL_TABLET | Freq: Once | ORAL | Status: AC
  Administered 2015-03-18: 650 mg via ORAL
  Filled 2015-03-18: qty 2

## 2015-03-18 MED ORDER — POTASSIUM CHLORIDE CRYS ER 20 MEQ PO TBCR
40.0000 meq | EXTENDED_RELEASE_TABLET | Freq: Once | ORAL | Status: AC
  Administered 2015-03-18: 40 meq via ORAL
  Filled 2015-03-18: qty 2

## 2015-03-18 MED ORDER — SODIUM CHLORIDE 0.9 % IR SOLN: 200.0000 mL | Status: DC | PRN

## 2015-03-18 MED ORDER — SODIUM CHLORIDE 0.9 % IV SOLN
250.0000 mL | Freq: Once | INTRAVENOUS | Status: AC
  Administered 2015-03-18: 250 mL via INTRAVENOUS

## 2015-03-18 MED ORDER — CHLORHEXIDINE GLUCONATE 0.12 % MT SOLN
15.0000 mL | Freq: Two times a day (BID) | OROMUCOSAL | Status: DC
  Administered 2015-03-18 – 2015-03-21 (×6): 15 mL via OROMUCOSAL
  Filled 2015-03-18 (×6): qty 15

## 2015-03-18 MED ORDER — HYDROCORTISONE NA SUCCINATE PF 100 MG IJ SOLR
50.0000 mg | Freq: Every day | INTRAMUSCULAR | Status: DC
  Administered 2015-03-18 – 2015-03-20 (×3): 50 mg via INTRAVENOUS
  Filled 2015-03-18 (×3): qty 2

## 2015-03-18 MED ORDER — IPRATROPIUM-ALBUTEROL 0.5-2.5 (3) MG/3ML IN SOLN
3.0000 mL | Freq: Four times a day (QID) | RESPIRATORY_TRACT | Status: DC
  Administered 2015-03-18 – 2015-03-19 (×3): 3 mL via RESPIRATORY_TRACT
  Filled 2015-03-18 (×2): qty 3

## 2015-03-18 MED ORDER — IPRATROPIUM-ALBUTEROL 0.5-2.5 (3) MG/3ML IN SOLN
RESPIRATORY_TRACT | Status: AC
  Filled 2015-03-18: qty 3

## 2015-03-18 NOTE — Care Management Note (Signed)
Case Management Note  Patient Details  Name: [name] MRN: [id number] Date of Birth: [birth year]  Subjective/Objective:       Wbc 2.2 platlets 13/in sdu due to oral infection, airway worries and  isolation due to low wbc             Action/Plan: Date:  Sept.6, 2016 U.R. performed for needs and level of care. Will continue to follow for Case Management needs.  Velva Harman, RN, BSN, [place]   [phone]   Expected Discharge Date:   (unknown)               Expected Discharge Plan:     In-House Referral:     Discharge planning Services     Post Acute Care Choice:    Choice offered to:     DME Arranged:    DME Agency:     HH Arranged:    [place] Agency:     Status of Service:     Medicare Important Message Given:    Date Medicare IM Given:    Medicare IM give by:    Date Additional Medicare IM Given:    Additional Medicare Important Message give by:     If discussed at [place] of Stay Meetings, dates discussed:  [id number]  Additional Comments:  Leeroy Cha, RN 03/18/2015, 1:46 PM

## 2015-03-18 NOTE — Progress Notes (Addendum)
2 clear capsules with brown liquid inside, found in bedside table drawer. Patient grabbed cup and took one of pills. When asked what pills were for patient stated "vitamin supplement that his daughter brought to him." Patient instructed that outside medication should not be taken without attending physician approval. Patient daughter informed that outside medication should not be taken without notifying physician first. Patient daughter Lattie Haw to take capsule home. Dr. Tyrell Antonio notified.

## 2015-03-18 NOTE — Progress Notes (Signed)
POST OPERATIVE NOTE: Postop day #4  03/18/2015   [name] [id number]  VITALS: BP 148/63 mmHg  Pulse 114  Temp(Src) 99.5 F (37.5 C) (Oral)  Resp 23  Ht 6' (1.829 m)  Wt 235 lb 14.3 oz (107 kg)  BMI 31.99 kg/m2  SpO2 98%  LABS:  Lab Results  Component Value Date   WBC 2.2* 03/18/2015   HGB 8.4* 03/18/2015   HCT 23.1* 03/18/2015   MCV 87.8 03/18/2015   PLT 13* 03/18/2015   BMET    Component Value Date/Time   NA 131* 03/18/2015 0527   NA 126* 03/11/2015 1145   NA 135 05/21/2014 1345   K 3.6 03/18/2015 0527   K 4.4 Sl. Hemolysis 03/11/2015 1145   CL 100* 03/18/2015 0527   CL 106 12/28/2012 1059   CO2 26 03/18/2015 0527   CO2 21* 03/11/2015 1145   GLUCOSE 121* 03/18/2015 0527   GLUCOSE 122 03/11/2015 1145   GLUCOSE 157* 05/21/2014 1345   GLUCOSE 148* 12/28/2012 1059   BUN 17 03/18/2015 0527   BUN 16.1 03/11/2015 1145   BUN 24 05/21/2014 1345   CREATININE 0.85 03/18/2015 0527   CREATININE 1.2 03/11/2015 1145   CREATININE 0.89 01/31/2013 1217   CALCIUM 7.8* 03/18/2015 0527   CALCIUM 8.7 03/11/2015 1145   GFRNONAA >60 03/18/2015 0527   GFRNONAA 84 01/31/2013 1217   GFRAA >60 03/18/2015 0527   GFRAA >89 01/31/2013 1217    Lab Results  Component Value Date   INR 1.31 03/15/2015   INR 1.49 03/11/2015   INR 1.12 01/23/2015   No results found for: PTT   [name] is status post extraction of tooth #18 with alveoloplasty on 03/14/2015. Patient has history of multiple myeloma, myelodysplasia, thrombocytopenia, and chemotherapy. Patient was initially seen for postop evaluation the evening of the dental extraction on 03/14/2015. No significant bleeding was noted. Patient is now seen for reevaluation of bleeding.  SUBJECTIVE: Patient with some discomfort from the lower left extraction site. Patient denies having any active bleeding. Patient has just completed breakfast.  EXAM: There is no sign of oral infection, heme, or ooze. Sutures are intact. Clot is  present. Food debris in mouth consistent with recent breakfast.  PROCEDURE: The patient was given a chlorhexidine gluconate rinse for 30 seconds. Sutures were then removed without complication. Patient tolerated the procedure well.  ASSESSMENT: Post operative course is consistent with dental procedures performed in the OR on 03/14/15. No bleeding from dental extraction site.  PLAN: 1. Chlorhexidine gluconate rinses after breakfast and at bedtime. 2. Salt water rinses every 2 hours in between the chlorhexidine rinses 3. Advance diet as tolerated. 4. To follow for possible development of osteonecrosis of the jaw   Lenn Cal, DDS

## 2015-03-18 NOTE — Progress Notes (Signed)
[facility] PROGRESS NOTE  Yisrael Obryan ZOX:[id number] DOB: [birth year] DOA: 03/11/2015 PCP: Sharion Balloon, FNP  Assessment/Plan: 79 year old with PMH significant for MM, Myelodysplasic syndrome, thrombocytopenia, admitted 80-30-2016 with neutropenic fever, thrombocytopenia, dental infection, and possible PNA. Patient was started on broad spectrum antibiotics Vancomycin and Aztreonam. He is currently on Granix for leukopenia, stress dose steroids for sepsis. He underwent dental extraction on 9-2. He continue to have pancytopenia. He spike fever 9-04. He was started on IV fluconazole. CT maxillofacial soft tissue opacification in the superior nasal cavity. Awaiting hematology evaluation. He continue to have thrombocytopenia. He received platelet transfusion 9-05. Palliative care was consulted. Patient is not ready for conversation regarding Code status. He wants to discussed this with his wife.    1-Neutropenic fever/SEPSIS: Likely secondary to HCAP from ongoing chemotherapy and tooth infection. Lactic acid trending down.  Continue with IV antibiotics. Day 7. Continue with Granix.  On steroid stress dose. Tapering steroid. He is on Decadron  weekly.  Spike fever  9-04. Chest x ray with no active diseases.UA negative for infection.  Started  Fluconazole 9-04 . He has a soft tissue opacification in the superior nasal cavity. Might need ENT evaluation. Awaiting hematology recommendation unclear if it will safe to proceed with any procedure due to patient hypercoagulable state and thrombocytopenia.   Dental infection: -patient S/P Extraction of tooth #18 with alveoloplasty -tolerated procedure well.  -  first left mandibular molar very loose with significant surrounding erythema but no appreciable fluctuance.  - antibiotics as above -CT maxillofacial: Lateral soft tissue swelling adjacent to the residual left mandibular molar representing early or resolving subperiosteal abscess.   DVT right  popliteal vein;  No candidate for anticoagulation.  S/P infrarenal IVC filter 9-03  Right Arm swelling;  Doppler positive for RUE DVT. Not a candidate for anticoagulation.   Hyponatremia: 126 on admission. Previous 128 on 8/26 and 131 on 8/22.  Worsening hyponatremia. He has received IV fluids. Suspect now hypervolemic hyponatremia/  Stop IV fluids.  Sodium improved after IV lasix.  Stable.   Multiple Myeloma: = Last chemo 5 days prior to admission. All hematologic cell lines down. - continue Decadron Qwkly (Friday) on hold. On stress steroids dose.  - Dr Benay Spice and Marin Olp following.  -thrombocytopenia, oncologist was contacted regarding thrombocytopenia./  -no significant improvement of platelet count  after transfusion.   DM: Will hold  invokana and metformin - A1c - SSI  Chronic diastolic CHF: Last echo showing EF of 81-19% grade 1 diastolic dysfunction. One dose IV lasix.  ANxiety/insomnia: - continue Klonopin  BPH: - continue flomax  D-dimer ; V-Q scan negative. Doppler right LE DVT popliteal vein.    Code Status: Full Code.  Family Communication: Care discussed with patient.  Disposition Plan: awaiting hematology evaluation.    Consultants:  Dr Learta Codding  Procedures:  Right external Jugular.   Antibiotics:  Vancomycin 8-30  Aztreonam 8-30  Fluconazole 9-04  HPI/Subjective: He is feeling the same. He is not ready to talk about live support or resuscitation.     Objective: Filed Vitals:   03/18/15 0600  BP: 166/57  Pulse: 95  Temp:   Resp: 18    Intake/Output Summary (Last 24 hours) at 03/18/15 0842 Last data filed at 03/18/15 0706  Gross per 24 hour  Intake   2230 ml  Output    875 ml  Net   1355 ml   Filed Weights   03/11/15 1635 03/14/15 0438  Weight: 101.152 kg (223  lb) 107 kg (235 lb 14.3 oz)    Exam:   General:  Alert in no distress.  Cardiovascular: S 1, S 2 RRR  Respiratory: crackles at bases.   Abdomen: Bs  present, soft, nt  Musculoskeletal: right upper extremity edema, right Lower extremity edema.   Data Reviewed: Basic Metabolic Panel:  Recent Labs Lab 03/15/15 0600 03/15/15 1717 03/16/15 0600 03/17/15 0520 03/18/15 0527  NA 124* 129* 132* 132* 131*  K 3.8 3.9 3.5 3.7 3.6  CL 99* 101 101 102 100*  CO2 20* $Remov'23 22 23 26  'vKQHem$ GLUCOSE 125* 211* 82 132* 121*  BUN 19 22* $Remo'19 18 17  'KCbwV$ CREATININE 0.80 0.96 0.88 0.84 0.85  CALCIUM 7.0* 7.4* 7.7* 7.8* 7.8*   Liver Function Tests:  Recent Labs Lab 03/11/15 1145  AST 22  ALT 15  ALKPHOS 45  BILITOT 0.73  PROT 8.2  ALBUMIN 2.6*   No results for input(s): LIPASE, AMYLASE in the last 168 hours. No results for input(s): AMMONIA in the last 168 hours. CBC:  Recent Labs Lab 03/13/15 0403 03/14/15 0511 03/15/15 0600 03/16/15 0600 03/17/15 0520 03/18/15 0527  WBC 1.6* 2.1* 2.3* 2.5* 2.2* 2.2*  NEUTROABS 0.2* 0.7* 0.7* 0.6* 0.8*  --   HGB 8.5* 8.4* 9.1* 9.4* 8.5* 8.4*  HCT 23.3* 22.9* 24.7* 26.0* 23.4* 23.1*  MCV 88.6 87.7 88.2 88.4 87.3 87.8  PLT 44* 36* 43* 19* 9* 13*   Cardiac Enzymes: No results for input(s): CKTOTAL, CKMB, CKMBINDEX, TROPONINI in the last 168 hours. BNP (last 3 results) No results for input(s): BNP in the last 8760 hours.  ProBNP (last 3 results) No results for input(s): PROBNP in the last 8760 hours.  CBG:  Recent Labs Lab 03/17/15 0739 03/17/15 1150 03/17/15 1654 03/17/15 2119 03/18/15 0736  GLUCAP 162* 211* 219* 210* 146*    Recent Results (from the past 240 hour(s))  TECHNOLOGIST REVIEW     Status: None   Collection Time: 03/10/15  8:36 AM  Result Value Ref Range Status   Technologist Review 2% Blast  Final  Urine Culture     Status: None   Collection Time: 03/11/15 11:45 AM  Result Value Ref Range Status   Urine Culture, Routine Culture, Urine  Final    Comment: Final - ===== COLONY COUNT: ===== NO GROWTH NO GROWTH   Culture, blood (routine x 2)     Status: None   Collection Time:  03/11/15  3:23 PM  Result Value Ref Range Status   Specimen Description BLOOD LEFT WRIST  Final   Special Requests IN PEDIATRIC BOTTLE 3CC  Final   Culture   Final    NO GROWTH 5 DAYS Performed at [facility]    Report Status 03/16/2015 FINAL  Final  Culture, blood (routine x 2)     Status: None   Collection Time: 03/11/15  3:44 PM  Result Value Ref Range Status   Specimen Description BLOOD LEFT ANTECUBITAL  Final   Special Requests BOTTLES DRAWN AEROBIC AND ANAEROBIC 5CC  Final   Culture   Final    NO GROWTH 5 DAYS Performed at [facility]    Report Status 03/16/2015 FINAL  Final  Urine culture     Status: None   Collection Time: 03/11/15  5:18 PM  Result Value Ref Range Status   Specimen Description URINE, CLEAN CATCH  Final   Special Requests NONE  Final   Culture   Final    3,000 COLONIES/mL INSIGNIFICANT GROWTH  Performed at [facility]    Report Status 03/12/2015 FINAL  Final  MRSA PCR Screening     Status: None   Collection Time: 03/11/15  6:28 PM  Result Value Ref Range Status   MRSA by PCR NEGATIVE NEGATIVE Final    Comment:        The GeneXpert MRSA Assay (FDA approved for NASAL specimens only), is one component of a comprehensive MRSA colonization surveillance program. It is not intended to diagnose MRSA infection nor to guide or monitor treatment for MRSA infections.   Urine culture     Status: None   Collection Time: 03/11/15  9:30 PM  Result Value Ref Range Status   Specimen Description URINE, CLEAN CATCH  Final   Special Requests NONE  Final   Culture   Final    NO GROWTH 1 DAY Performed at [facility]    Report Status 03/13/2015 FINAL  Final     Studies: No results found.  Scheduled Meds: . acyclovir  400 mg Oral BID  . atorvastatin  20 mg Oral q1800  . aztreonam  2 g Intravenous 3 times per day  . diclofenac sodium  2 g Topical QID  . fluconazole (DIFLUCAN) IV  100 mg Intravenous Q24H  . hydrocortisone  sod succinate (SOLU-CORTEF) inj  50 mg Intravenous Q12H  . insulin aspart  0-15 Units Subcutaneous TID WC  . insulin aspart  0-5 Units Subcutaneous QHS  . pantoprazole  40 mg Oral BID  . polyethylene glycol  17 g Oral BID  . sodium chloride  10-40 mL Intracatheter Q12H  . sodium chloride  3 mL Intravenous Q12H  . tamsulosin  0.4 mg Oral QPC supper  . Tbo-Filgrastim  480 mcg Subcutaneous Q24H  . vancomycin  1,000 mg Intravenous Q8H   Continuous Infusions: . lactated ringers 10 mL/hr at 03/18/15 0411    Principal Problem:   Sepsis Active Problems:   GERD (gastroesophageal reflux disease)   Neutropenic fever   Chronic diastolic congestive heart failure   Hyponatremia   Dental infection   Diabetes mellitus with complication   Anxiety state   Thrombocytopenia   DVT (deep venous thrombosis)   Palliative care encounter   Goals of care, counseling/discussion    Time spent: 35 minutes.     Niel Hummer A  [facility] Pager [phone]. If 7PM-7AM, please contact night-coverage at [email or web address], password [facility] 03/18/2015, 8:42 AM  LOS: 7 days

## 2015-03-19 ENCOUNTER — Inpatient Hospital Stay (HOSPITAL_COMMUNITY): Payer: Medicare HMO

## 2015-03-19 DIAGNOSIS — R509 Fever, unspecified: Secondary | ICD-10-CM

## 2015-03-19 LAB — GLUCOSE, CAPILLARY
GLUCOSE-CAPILLARY: 92 mg/dL (ref 65–99)
GLUCOSE-CAPILLARY: 78 mg/dL (ref 65–99)
Glucose-Capillary: 68 mg/dL (ref 65–99)
Glucose-Capillary: 110 mg/dL — ABNORMAL HIGH (ref 65–99)
Glucose-Capillary: 196 mg/dL — ABNORMAL HIGH (ref 65–99)

## 2015-03-19 LAB — HEPATIC FUNCTION PANEL
ALT: 41 U/L (ref 17–63)
AST: 62 U/L — AB (ref 15–41)
Albumin: 1.7 g/dL — ABNORMAL LOW (ref 3.5–5.0)
Alkaline Phosphatase: 80 U/L (ref 38–126)
BILIRUBIN DIRECT: 0.7 mg/dL — AB (ref 0.1–0.5)
BILIRUBIN INDIRECT: 0.5 mg/dL (ref 0.3–0.9)
Total Bilirubin: 1.2 mg/dL (ref 0.3–1.2)
Total Protein: 5.8 g/dL — ABNORMAL LOW (ref 6.5–8.1)

## 2015-03-19 LAB — BASIC METABOLIC PANEL
ANION GAP: 7 (ref 5–15)
BUN: 21 mg/dL — ABNORMAL HIGH (ref 6–20)
CALCIUM: 7.7 mg/dL — AB (ref 8.9–10.3)
CHLORIDE: 102 mmol/L (ref 101–111)
CO2: 24 mmol/L (ref 22–32)
Creatinine, Ser: 1.21 mg/dL (ref 0.61–1.24)
GFR calc non Af Amer: 56 mL/min — ABNORMAL LOW (ref >60)
Glucose, Bld: 81 mg/dL (ref 65–99)
POTASSIUM: 3.8 mmol/L (ref 3.5–5.1)
Sodium: 133 mmol/L — ABNORMAL LOW (ref 135–145)

## 2015-03-19 LAB — CBC
HEMATOCRIT: 23.2 % — AB (ref 39.0–52.0)
HEMOGLOBIN: 8.4 g/dL — AB (ref 13.0–17.0)
MCH: 31.7 pg (ref 26.0–34.0)
MCHC: 36.2 g/dL — ABNORMAL HIGH (ref 30.0–36.0)
MCV: 87.5 fL (ref 78.0–100.0)
Platelets: 6 10*3/uL — CL (ref 150–400)
RBC: 2.65 MIL/uL — AB (ref 4.22–5.81)
RDW: 19.9 % — AB (ref 11.5–15.5)
WBC: 1.7 10*3/uL — AB (ref 4.0–10.5)

## 2015-03-19 LAB — VANCOMYCIN, TROUGH: Vancomycin Tr: 26 ug/mL (ref 10.0–20.0)

## 2015-03-19 MED ORDER — VANCOMYCIN HCL IN DEXTROSE 1-5 GM/200ML-% IV SOLN
1000.0000 mg | Freq: Two times a day (BID) | INTRAVENOUS | Status: DC
  Administered 2015-03-19 – 2015-03-20 (×2): 1000 mg via INTRAVENOUS
  Filled 2015-03-19: qty 200

## 2015-03-19 MED ORDER — SODIUM CHLORIDE 0.9 % IV BOLUS (SEPSIS)
250.0000 mL | Freq: Once | INTRAVENOUS | Status: AC
  Administered 2015-03-19: 250 mL via INTRAVENOUS

## 2015-03-19 MED ORDER — GLUCERNA SHAKE PO LIQD
237.0000 mL | Freq: Three times a day (TID) | ORAL | Status: DC
  Administered 2015-03-19 – 2015-03-20 (×3): 237 mL via ORAL
  Filled 2015-03-19 (×10): qty 237

## 2015-03-19 MED ORDER — IPRATROPIUM-ALBUTEROL 0.5-2.5 (3) MG/3ML IN SOLN
3.0000 mL | Freq: Three times a day (TID) | RESPIRATORY_TRACT | Status: DC
  Administered 2015-03-19 – 2015-03-20 (×4): 3 mL via RESPIRATORY_TRACT
  Filled 2015-03-19 (×4): qty 3

## 2015-03-19 MED ORDER — MORPHINE SULFATE (PF) 2 MG/ML IV SOLN
1.0000 mg | INTRAVENOUS | Status: DC | PRN
  Administered 2015-03-20 (×4): 2 mg via INTRAVENOUS
  Filled 2015-03-19 (×4): qty 1

## 2015-03-19 MED ORDER — ACETAMINOPHEN 500 MG PO TABS
1000.0000 mg | ORAL_TABLET | Freq: Once | ORAL | Status: AC
  Administered 2015-03-19: 1000 mg via ORAL

## 2015-03-19 MED ORDER — ACETAMINOPHEN 500 MG PO TABS
ORAL_TABLET | ORAL | Status: AC
  Filled 2015-03-19: qty 2

## 2015-03-19 MED ORDER — SODIUM CHLORIDE 0.9 % IV SOLN
Freq: Once | INTRAVENOUS | Status: AC
  Administered 2015-03-19: 10:00:00 via INTRAVENOUS

## 2015-03-19 NOTE — Progress Notes (Addendum)
[facility] Progress Note   Tuan Tippin  TZG:[id number]  DOB: 07-27-1935  DOA: 03/11/2015 PCP: Sharion Balloon, FNP  Brief narrative: [name] is a 79 y.o. male with multiple myeloma, myelodysplastic syndrome, hypercoagulable state, significant thrombocytopenia, diabetes mellitus on oral medication admitted with neutropenic fever from a dental infection and a possible pneumonia. He was given granix for leukopenia. He underwent extraction of tooth (left lower molar) with alveoloplasty on 9/2. And then developed a fever of 102.6 on 9/4 despite being on vancomycin and aztreonam. Fluconazole was added however, he continues to have fevers. He also has a right popliteal DVT for which she received an IVC filter on 9/3. He was subsequently had swelling of his right arm was found to have a right brachial DVT on 9/3. Due to thrombocytopenia, he is not able to be anticoagulated.   Subjective: Complains of pain in his right arm. He has no other complaints.   Assessment/Plan: Principal Problem:   Sepsis/neutropenic fever -Spectrum to be secondary to HCAP and dental infection-status post dental extraction on 9/2 -Has had over a week of antibiotics (started 9/1) and is currently receiving fluconazole, acyclovir, Azactam and vancomycin but continues to spike fevers-oncology suspects that fevers are likely secondary to underlying cancer -Palliative care has been called to speak with patient and wife but they refused hospice -  Dr. Learta Codding is advising hospice home today which I'm in agreement with as well- He and his wife plan on him to go home with hospice. According to the nurse it is taking 2 people to help him out to a bedside commode. He tells me himself that he is too weak to turn in the bed and is asking for me to call the nurse to help turn him. I have discussed in detail that he is clearly too weak to go home and should consider [facility]. I have explained this to his daughter Lattie Haw as well  who is in agreement with this. -He does not want to change his CODE STATUS at this time  Acute respiratory distress -Currently tachypnea and tachycardic likely due to ongoing fevers and pain but will get a chest x-ray to ensure that he is not fluid overloaded  Multiple myeloma and myelodysplasia -Dr. Benay Spice does not recommend any further chemotherapy  Pancytopenia -Platelets down to 6 again today and therefore I transfuse one unit of packed red blood cells-Dr. Benay Spice recommends holding off on platelet transfusion unless he is bleeding -Granix discontinued by Dr. Benay Spice  Right arm DVT/ right leg DVT -IVC filter for right leg DVT -Due to severe thrombocytopenia, not a candidate for anticoagulation  Diabetes mellitus -Hold and Invokana and metformin and continue sliding scale  Chronic diastolic heart failure - compensated  BPH -Continue Flomax  Anxiety -Continue when necessary Klonopin      Code Status:     Code Status Orders        Start     Ordered   03/14/15 0936  Full code   Continuous     03/14/15 0936     Family Communication: With wife at bedside and daughter Lattie Haw Disposition Plan: [facility] DVT prophylaxis: SCDs Consultants: Oncology, palliative, dental or jury Procedures:. 9/2 Extraction of tooth #18 with alveoloplasty PICC line  Antibiotics: Anti-infectives    Start     Dose/Rate Route Frequency Ordered Stop   03/16/15 1000  fluconazole (DIFLUCAN) IVPB 100 mg     100 mg 50 mL/hr over 60 Minutes Intravenous Every 24 hours 03/16/15 0826  03/13/15 1130  acyclovir (ZOVIRAX) tablet 400 mg     400 mg Oral 2 times daily 03/13/15 1007     03/13/15 0600  vancomycin (VANCOCIN) IVPB 1000 mg/200 mL premix     1,000 mg 200 mL/hr over 60 Minutes Intravenous Every 8 hours 03/13/15 0526     03/12/15 0600  vancomycin (VANCOCIN) IVPB 1000 mg/200 mL premix  Status:  Discontinued     1,000 mg 200 mL/hr over 60 Minutes Intravenous  Every 12 hours 03/11/15 1529 03/13/15 0526   03/11/15 1600  aztreonam (AZACTAM) 2 g in dextrose 5 % 50 mL IVPB     2 g 100 mL/hr over 30 Minutes Intravenous 3 times per day 03/11/15 1529     03/11/15 1600  vancomycin (VANCOCIN) 1,500 mg in sodium chloride 0.9 % 500 mL IVPB     1,500 mg 250 mL/hr over 120 Minutes Intravenous  Once 03/11/15 1529 03/11/15 1826      Objective: Filed Weights   03/11/15 1635 03/14/15 0438  Weight: 101.152 kg (223 lb) 107 kg (235 lb 14.3 oz)    Intake/Output Summary (Last 24 hours) at 03/19/15 0723 Last data filed at 03/19/15 0700  Gross per 24 hour  Intake   1420 ml  Output   1200 ml  Net    220 ml     Vitals Filed Vitals:   03/19/15 0500 03/19/15 0600 03/19/15 0647 03/19/15 0700  BP:      Pulse: 141 147 140 139  Temp:  100.5 F (38.1 C)    TempSrc:  Oral    Resp: 30 33 26 33  Height:      Weight:      SpO2: 93% 96% 96% 90%    Exam:  General:  Pt is alert, rapid respiratory rate noted  HEENT: No icterus, No thrush, oral mucosa moist  Cardiovascular: regular rate and rhythm, S1/S2 No murmur, tachycardia  Respiratory: clear to auscultation bilaterally   Abdomen: Soft, +Bowel sounds, non tender, non distended, no guarding  MSK: No LE edema, cyanosis or clubbing- left hand is swollen  Data Reviewed: Basic Metabolic Panel:  Recent Labs Lab 03/15/15 1717 03/16/15 0600 03/17/15 0520 03/18/15 0527 03/19/15 0500  NA 129* 132* 132* 131* 133*  K 3.9 3.5 3.7 3.6 3.8  CL 101 101 102 100* 102  CO2 $Re'23 22 23 26 24  'XSY$ GLUCOSE 211* 82 132* 121* 81  BUN 22* $Remov'19 18 17 'pVfLEa$ 21*  CREATININE 0.96 0.88 0.84 0.85 1.21  CALCIUM 7.4* 7.7* 7.8* 7.8* 7.7*   Liver Function Tests: No results for input(s): AST, ALT, ALKPHOS, BILITOT, PROT, ALBUMIN in the last 168 hours. No results for input(s): LIPASE, AMYLASE in the last 168 hours. No results for input(s): AMMONIA in the last 168 hours. CBC:  Recent Labs Lab 03/13/15 0403 03/14/15 0511  03/15/15 0600 03/16/15 0600 03/17/15 0520 03/18/15 0527 03/19/15 0500  WBC 1.6* 2.1* 2.3* 2.5* 2.2* 2.2* 1.7*  NEUTROABS 0.2* 0.7* 0.5* 0.3* 0.8*  --   --   HGB 8.5* 8.4* 9.1* 9.4* 8.5* 8.4* 8.4*  HCT 23.3* 22.9* 24.7* 26.0* 23.4* 23.1* 23.2*  MCV 88.6 87.7 88.2 88.4 87.3 87.8 87.5  PLT 44* 36* 43* 19* 9* 13* 6*   Cardiac Enzymes: No results for input(s): CKTOTAL, CKMB, CKMBINDEX, TROPONINI in the last 168 hours. BNP (last 3 results) No results for input(s): BNP in the last 8760 hours.  ProBNP (last 3 results) No results for input(s): PROBNP in the last 8760 hours.  CBG:  Recent Labs Lab 03/17/15 2119 03/18/15 0736 03/18/15 1153 03/18/15 1650 03/18/15 2210  GLUCAP 210* 146* 116* 159* 158*    Recent Results (from the past 240 hour(s))  TECHNOLOGIST REVIEW     Status: None   Collection Time: 03/10/15  8:36 AM  Result Value Ref Range Status   Technologist Review 2% Blast  Final  Urine Culture     Status: None   Collection Time: 03/11/15 11:45 AM  Result Value Ref Range Status   Urine Culture, Routine Culture, Urine  Final    Comment: Final - ===== COLONY COUNT: ===== NO GROWTH NO GROWTH   Culture, blood (routine x 2)     Status: None   Collection Time: 03/11/15  3:23 PM  Result Value Ref Range Status   Specimen Description BLOOD LEFT WRIST  Final   Special Requests IN PEDIATRIC BOTTLE 3CC  Final   Culture   Final    NO GROWTH 5 DAYS Performed at [facility]    Report Status 03/16/2015 FINAL  Final  Culture, blood (routine x 2)     Status: None   Collection Time: 03/11/15  3:44 PM  Result Value Ref Range Status   Specimen Description BLOOD LEFT ANTECUBITAL  Final   Special Requests BOTTLES DRAWN AEROBIC AND ANAEROBIC 5CC  Final   Culture   Final    NO GROWTH 5 DAYS Performed at [facility]    Report Status 03/16/2015 FINAL  Final  Urine culture     Status: None   Collection Time: 03/11/15  5:18 PM  Result Value Ref Range Status    Specimen Description URINE, CLEAN CATCH  Final   Special Requests NONE  Final   Culture   Final    3,000 COLONIES/mL INSIGNIFICANT GROWTH Performed at [facility]    Report Status 03/12/2015 FINAL  Final  MRSA PCR Screening     Status: None   Collection Time: 03/11/15  6:28 PM  Result Value Ref Range Status   MRSA by PCR NEGATIVE NEGATIVE Final    Comment:        The GeneXpert MRSA Assay (FDA approved for NASAL specimens only), is one component of a comprehensive MRSA colonization surveillance program. It is not intended to diagnose MRSA infection nor to guide or monitor treatment for MRSA infections.   Urine culture     Status: None   Collection Time: 03/11/15  9:30 PM  Result Value Ref Range Status   Specimen Description URINE, CLEAN CATCH  Final   Special Requests NONE  Final   Culture   Final    NO GROWTH 1 DAY Performed at [facility]    Report Status 03/13/2015 FINAL  Final     Studies: No results found.  Scheduled Meds:  Scheduled Meds: . acyclovir  400 mg Oral BID  . atorvastatin  20 mg Oral q1800  . aztreonam  2 g Intravenous 3 times per day  . chlorhexidine  15 mL Mouth/Throat BID  . diclofenac sodium  2 g Topical QID  . fluconazole (DIFLUCAN) IV  100 mg Intravenous Q24H  . hydrocortisone sod succinate (SOLU-CORTEF) inj  50 mg Intravenous Daily  . insulin aspart  0-15 Units Subcutaneous TID WC  . insulin aspart  0-5 Units Subcutaneous QHS  . ipratropium-albuterol  3 mL Nebulization TID  . pantoprazole  40 mg Oral BID  . polyethylene glycol  17 g Oral BID  . sodium chloride  10-40 mL Intracatheter  Q12H  . sodium chloride  3 mL Intravenous Q12H  . tamsulosin  0.4 mg Oral QPC supper  . Tbo-Filgrastim  480 mcg Subcutaneous Q24H  . vancomycin  1,000 mg Intravenous Q8H   Continuous Infusions: . lactated ringers 1,000 mL (03/18/15 1959)    Time spent on care of this patient: 53 min   [place], MD 03/19/2015, 7:23 AM  LOS: 8 days    [facility] Office  [phone] Pager - Text Page per [email or web address] If 7PM-7AM, please contact night-coverage [email or web address]

## 2015-03-19 NOTE — Progress Notes (Signed)
[facility]   HEMATOLOGY/ONCOLOGY INPATIENT PROGRESS NOTE  Date of Service: 03/18/2015 (late entry due to computer issues)  Inpatient Attending: .Debbe Odea, MD   SUBJECTIVE  [name] was seen at 5:30 pm in the ICU. Wife at bedside updated and explained the situation in details. Patient still continues to spike and have low platelets and WBC counts. On broad spectrum antibiotics. Notes that he feels weak. Still with some cough. Using chlorhexidine mouthwashes. No overt bleeding.  OBJECTIVE:  Patient notes no acute distress  PHYSICAL EXAMINATION: VS per EPIC Tmax was 103F .Body mass index is 31.99 kg/(m^2).  GENERAL:alert, feels fatigued EYES: normal, conjunctiva are pink and non-injected, sclera clear OROPHARYNX  oral mucosa moist NECK: supple, no JVD, thyroid normal size, non-tender, without nodularity LYMPH:  no palpable lymphadenopathy in the cervical, axillary or inguinal LUNGS: clear to auscultation with normal respiratory effort HEART: S1-S2 regular ABDOMEN: abdomen soft, non-tender, normoactive bowel sounds  Musculoskeletal: Right upper extremity and right lower extremity swelling PSYCH: alert & oriented x 3 with fluent speech NEURO: no focal motor/sensory deficits  MEDICAL HISTORY:  Past Medical History  Diagnosis Date  . GERD (gastroesophageal reflux disease)   . Essential hypertension, benign   . BPH (benign prostatic hyperplasia)   . Lumbar herniated disc     L4-5  . Hyperlipidemia   . Sleep apnea   . Multiple myeloma 02/19/2008  . Arthritis   . Diverticulosis   . Peptic ulcer disease   . Tubular adenoma 02/16/2008    Dr. Silvano Rusk  . MDS (myelodysplastic syndrome) 06/17/2014  . Diabetes mellitus without complication     SURGICAL HISTORY: Past Surgical History  Procedure Laterality Date  . Neck surgery    . Lumbar laminectomy/decompression microdiscectomy  11/01/2011    Procedure: LUMBAR LAMINECTOMY/DECOMPRESSION MICRODISCECTOMY 2 LEVELS;  Surgeon: Charlie Pitter, MD;  Location: [facility];  Service: Neurosurgery;  Laterality: Right;  Lumbar Laminectomy/Microdiscectomy Decompression Lumbar Three-Four, Lumbar Four-Five   . Colonoscopy    . Hemorrhoid banding  2014  . Esophagogastroduodenoscopy    . Multiple extractions with alveoloplasty N/A 03/14/2015    Procedure: EXTRACTION WITH ALVEOLOPLASTY;  Surgeon: Lenn Cal, DDS;  Location: [facility];  Service: Oral Surgery;  Laterality: N/A;    SOCIAL HISTORY: Social History   Social History  . Marital Status: Married    Spouse Name: N/A  . Number of Children: 4  . Years of Education: N/A   Occupational History  . retired-disabled-Construction    Social History Main Topics  . Smoking status: Former Smoker -- 2.00 packs/day for 20 years    Types: Cigarettes    Start date: 09/15/1962    Quit date: 07/12/1990  . Smokeless tobacco: Never Used  . Alcohol Use: 0.0 oz/week    0 Standard drinks or equivalent per week     Comment: "Very seldom"  . Drug Use: No  . Sexual Activity: No   Other Topics Concern  . Not on file   Social History Narrative   Pt was adopted.   Married   Programmer, applications    FAMILY HISTORY: Family History  Problem Relation Age of Onset  . Adopted: Yes    ALLERGIES:  is allergic to penicillins.  MEDICATIONS:  Scheduled Meds: . acyclovir  400 mg Oral BID  . atorvastatin  20 mg Oral q1800  . aztreonam  2 g Intravenous 3 times per day  . chlorhexidine  15 mL Mouth/Throat BID  . diclofenac sodium  2 g Topical  QID  . fluconazole (DIFLUCAN) IV  100 mg Intravenous Q24H  . hydrocortisone sod succinate (SOLU-CORTEF) inj  50 mg Intravenous Daily  . insulin aspart  0-15 Units Subcutaneous TID WC  . insulin aspart  0-5 Units Subcutaneous QHS  . ipratropium-albuterol  3 mL Nebulization TID  . pantoprazole  40 mg Oral BID  . polyethylene glycol  17 g Oral BID  . sodium chloride  10-40 mL Intracatheter Q12H  . sodium chloride  3 mL Intravenous Q12H  . tamsulosin   0.4 mg Oral QPC supper  . Tbo-Filgrastim  480 mcg Subcutaneous Q24H  . vancomycin  1,000 mg Intravenous Q8H   Continuous Infusions: . lactated ringers 1,000 mL (03/18/15 1959)   PRN Meds:.acetaminophen **OR** acetaminophen, clonazePAM, hydrALAZINE, iohexol, loratadine, morphine injection, oxyCODONE, prochlorperazine, sodium chloride, sodium chloride irrigation, technetium TC 27M diethylenetriame-pentaacetic acid, traMADol  REVIEW OF SYSTEMS:    10 Point review of Systems was done is negative except as noted above.   LABORATORY DATA:  I have reviewed the data as listed  Component     Latest Ref Rng 03/18/2015  Sodium     135 - 145 mmol/L 131 (L)  Potassium     3.5 - 5.1 mmol/L 3.6  Chloride     101 - 111 mmol/L 100 (L)  CO2     22 - 32 mmol/L 26  Glucose     65 - 99 mg/dL 121 (H)  BUN     6 - 20 mg/dL 17  Creatinine     0.61 - 1.24 mg/dL 0.85  Calcium     8.9 - 10.3 mg/dL 7.8 (L)  EGFR (Non-African Amer.)     >60 mL/min >60  EGFR (African American)     >60 mL/min >60  Anion gap     5 - 15 5  WBC     4.0 - 10.5 K/uL 2.2 (L)  RBC     4.22 - 5.81 MIL/uL 2.63 (L)  Hemoglobin     13.0 - 17.0 g/dL 8.4 (L)  HCT     39.0 - 52.0 % 23.1 (L)  MCV     78.0 - 100.0 fL 87.8  MCH     26.0 - 34.0 pg 31.9  MCHC     30.0 - 36.0 g/dL 36.4 (H)  RDW     11.5 - 15.5 % 19.7 (H)  Platelets     150 - 400 K/uL 13 (LL)    RADIOGRAPHIC STUDIES: I have personally reviewed the radiological images as listed and agreed with the findings in the report. Dg Chest 2 View  03/11/2015   CLINICAL DATA:  Fever with weakness. Possible sepsis from tooth infection. Multiple myeloma. Initial encounter.  EXAM: CHEST  2 VIEW  COMPARISON:  02/28/2015 and 11/21/2014 radiographs.  FINDINGS: The heart size and mediastinal contours are stable. There are slightly lower lung volumes. On today's lateral view, there is increased density in the retrosternal clear space which may correspond with faint lingular  opacity on the frontal examination. There is no consolidation or significant pleural effusion. Telemetry leads overlie the chest. Previous lower cervical fusion noted. There are lucencies in the proximal right humerus attributed to the patient's multiple myeloma.  IMPRESSION: Possible developing lingular pneumonia.   Electronically Signed   By: Richardean Sale M.D.   On: 03/11/2015 15:42   Dg Chest 2 View  02/28/2015   CLINICAL DATA:  Shortness of breath, mid left-sided chest pain for the past week, history of multiple  myeloma, diabetes, and remote history of smoking  EXAM: CHEST  2 VIEW  COMPARISON:  [facility] lateral chest x-ray of Nov 21, 2014  FINDINGS: The lungs are well-expanded with hemidiaphragm flattening. There is no focal infiltrate. There is minimal stable blunting of the right lateral costophrenic angle. The heart and pulmonary vascularity are normal. The mediastinum is normal in width. The bony thorax exhibits no acute abnormality.  IMPRESSION: There is no CHF or pneumonia. Borderline hyperinflation with hemidiaphragm flattening is chronic and may reflect underlying COPD.   Electronically Signed   By: David  Martinique M.D.   On: 02/28/2015 09:52   Ir Ivc Filter Plmt / S&i /img Guid/mod Sed  03/15/2015   INDICATION: 79 year old male with acute right popliteal and right brachial DVT in the setting of multiple myeloma, myelodysplastic syndrome and thrombocytopenia. He is not a candidate for anticoagulation therapy and therefore caval interruption is warranted for PE prophylaxis.  EXAM: ULTRASOUND GUIDANCE FOR VASCULARACCESS  IVC CATHETERIZATION AND VENOGRAM  IVC FILTER INSERTION  COMPARISON:  None.  MEDICATIONS: Fentanyl 50 mcg IV; Versed 1 mg IV  ANESTHESIA/SEDATION: Sedation Time  11 minutes  CONTRAST:  39mL OMNIPAQUE IOHEXOL 300 MG/ML  SOLN  FLUOROSCOPY TIME:  1 minutes 24 seconds  820 mGy  COMPLICATIONS: None immediate  PROCEDURE: Informed consent was obtained from the patient following explanation of the  procedure, risks, benefits and alternatives. The patient understands, agrees and consents for the procedure. All questions were addressed. A time out was performed prior to the initiation of the procedure.  Maximal barrier sterile technique utilized including caps, mask, sterile gowns, sterile gloves, large sterile drape, hand hygiene, and Betadine prep.  Under sterile condition and local anesthesia, right internal jugular venous access was performed with ultrasound. An ultrasound image was saved and sent to PACS. Over a guidewire, the IVC filter delivery sheath and inner dilator were advanced into the IVC just above the IVC bifurcation. Contrast injection was performed for an IVC venogram.  Through the delivery sheath, a retrievable Denali IVC filter was deployed below the level of the renal veins and above the IVC bifurcation. Limited post deployment venacavagram was performed.  The delivery sheath was removed and hemostasis was obtained with manual compression. A dressing was placed. The patient tolerated the procedure well without immediate post procedural complication.  FINDINGS: The IVC is patent. No evidence of thrombus, stenosis, or occlusion. No variant venous anatomy. Successful placement of the IVC filter below the level of the renal veins.  IMPRESSION: Successful ultrasound and fluoroscopically guided placement of an infrarenal retrievable IVC filter via right jugular approach.  Due to patient related comorbidities and/or clinical necessity, this IVC filter should be considered a permanent device. This patient will not be actively followed for future filter retrieval.  Signed,  Criselda Peaches, MD  [facility]  [facility]   Electronically Signed   By: Jacqulynn Cadet M.D.   On: 03/15/2015 11:46   Nm Pulmonary Perf And Vent  03/12/2015   CLINICAL DATA:  Difficulty breathing  EXAM: NUCLEAR MEDICINE VENTILATION - PERFUSION LUNG SCAN  Views: Anterior,  posterior, left lateral, right lateral, RPO, LPO, RAO, LAO -ventilation and perfusion  Radionuclide: Technetium 55m DTPA -ventilation; Technetium 47m macroaggregated albumin-perfusion  Dose:  42.3 mCi-ventilation; 5.4 mCi-perfusion  Route of administration: Inhalation -ventilation ; intravenous -perfusion  COMPARISON:  Chest radiograph March 11, 2015  FINDINGS: Ventilation: Radiotracer uptake is homogeneous and symmetric bilaterally.  Perfusion: Radiotracer uptake is homogeneous and symmetric bilaterally.  IMPRESSION: There are no appreciable ventilation or perfusion defects. This study constitutes a very low probability of pulmonary embolus.   Electronically Signed   By: Lowella Grip III M.D.   On: 03/12/2015 12:04   Dg Chest Port 1 View  03/16/2015   CLINICAL DATA:  Generalized pain.  Hypertension.  EXAM: PORTABLE CHEST - 1 VIEW  COMPARISON:  03/11/2015  FINDINGS: There is a left upper extremity PICC line with tip in the low SVC. The lungs are grossly clear. There is no large effusion. Pulmonary vasculature is normal.  IMPRESSION: No active disease.   Electronically Signed   By: Andreas Newport M.D.   On: 03/16/2015 05:50   Ct Maxillofacial Wo Cm  03/12/2015   CLINICAL DATA:  Pain involving the residual left mandibular molar. Neutropenic fever. Sepsis.  EXAM: CT MAXILLOFACIAL WITHOUT CONTRAST  TECHNIQUE: Multidetector CT imaging of the maxillofacial structures was performed. Multiplanar CT image reconstructions were also generated. A small metallic BB was placed on the right temple in order to reliably differentiate right from left.  COMPARISON:  CT of the neck with contrast 09/01/2009.  FINDINGS: A dental caries is noted along the lateral margin of the crown of the residual left mandibular molar. Periapical lucency is noted about the posterior roots. Lateral soft tissue swelling or early subperiosteal abscess formation is noted with an area of low-density measuring 13 x 15 x 6 mm. The mandible is  otherwise intact.  A prominent dental caries are noted in the residual right maxillary molar with significant periapical lucency and minimal gas along the lateral margin of the root of the tooth.  Mandible is otherwise intact and located.  Mild mucosal thickening is present along the medial inferior aspect of the left maxillary sinus. There is soft tissue opacification of anterior left ethmoid air cells and the inferior left frontal sinus. The opacification partially occludes the superior nasal cavity on the left is well.  The mastoid air cells are clear.  Limited imaging the brain demonstrates mild atrophy, within normal limits for age.  IMPRESSION: 1. Lateral alveolar soft tissue swelling adjacent to the residual left mandibular molar with an area of low-density representing early or resolving subperiosteal abscess as described. 2. Prominent dental caries involving the right maxillary tooth with periapical lucency and some gas formation lateral to the tooth root. 3. Soft tissue opacification anterior ethmoid air cells extending into the superior nasal cavity. While this may represent a benign polyp, neoplasm or early fungal infection is also considered. Recommend ENT consultation for direct visualization. The lesion was not present in 2010.   Electronically Signed   By: San Morelle M.D.   On: 03/12/2015 13:30    ASSESSMENT & PLAN:     1. Multiple myeloma-currently being treated with salvage Daratumumab/Decadron therapy, last treated with week #3 on 03/07/2015  2. Myelodysplasia-treated with 5 azacytidine for progressive pancytopenia, last given June 2016. He was also noted to have some blasts in peripheral blood smear which could be due to G-CSF or preleukemic stage.  3. Severe pancytopenia secondary to multiple myeloma, myelodysplasia, and daratumumab.   4. Fever-likely related to an infected tooth, possible left lung pneumonia noted on an admission chest x-ray 03/11/2015  5.  Pain/tenderness and inflammation at a left lower molar, tooth #18 s/p dental extraction.  6. Chronic lung disease  7. Poor peripheral IV access  8. Right leg DVT noted on Doppler 03/13/2015 and now with right upper extremity DVT. Patient's thrombocytopenia has precluded any anticoagulation. He had  an IVC filter placed.  9. Thrombocytopenia - platelet counts are now down to 9000. Thrombocytopenia has been multifactorial from his MDS, myeloma, Daratumumab and possibly sepsis/antibiotics.  Plan -patient continues to spike despite broad spectrum antibiotic coverage + fluconazole. Could have fevers from extensive DVT as well. - continue antibiotics as per the hospitalist service. -dental following to ensure local source control -transfuse irradiated platelets to maintain counts more than 10,000. Additional transfusions if bleeding actively.  -Not a candidate for anticoagulation given his severe thrombocytopenia at this time  - Increase diet and ambulation as tolerated - Daily CBC/differential -Continue daily Neupogen until [place] more than thousand for 2 days. (will need CBC with diff everyday) -consider ID consultation. -Updated wife at bedside in details regarding his current clinical and hematological status. -will update Dr Learta Codding (his primary hematologist)   Please call with any questions.  The total time spent in the appointment was 25 minutes and more than 50% was on counseling and direct patient cares.    Sullivan Lone MD [place] AAHIVMS [facility] Hematology/Oncology Physician [place]  (Office):       [phone] (Work cell):  [phone] (Fax):           [phone]

## 2015-03-19 NOTE — Progress Notes (Signed)
Initial Nutrition Assessment  DOCUMENTATION CODES:   Obesity unspecified  INTERVENTION:  - Will order Glucerna Shake TID, each supplement provides 220 kcal and 10 grams of protein - RD will continue to monitor for needs  NUTRITION DIAGNOSIS:   Increased nutrient needs related to catabolic illness, cancer and cancer related treatments as evidenced by estimated needs.  GOAL:   Patient will meet greater than or equal to 90% of their needs  MONITOR:   PO intake, Supplement acceptance, Weight trends, Labs, I & O's  REASON FOR ASSESSMENT:   Malnutrition Screening Tool  ASSESSMENT:   From H&P on 8/30: Patient states that he can reschedule he developed a painful tooth on his left mandible. Patient was started and a biotics 1 day ago. Has been no change in his pain since starting the antibiotic. Denies any discharge out of the tooth. Some soft tissue swelling around the tooth. Additionally, yesterday evening when he developed bilateral crampy leg pain. This made it difficult for her to walk. Pain is constant. Denies leg swelling. Getting worse. Patient states that this morning when he woke up he was short of breath. This is worsened by ambulation. Excision with a general ill feeling. Has not taken any medicines for his symptoms. Patient's last chemotherapy was on Friday. Patient was scheduled for port placement today but was found to be febrile and seen here in the ED.  Pt seen for MST. BMI indicates obesity. Pt has been eating 50% of meals on average per chart review. Pt reports that he ate a little bit of lunch today but wife and RN state that pt did not eat lunch. RN reports that he had some juice earlier today and that he ate "a little bit" yesterday.  Pt not very responsive to RD questions at time of visit. Did not press wife, who is at bedside, concerning information about appetite and weight PTA. Palliative Care is following.  Did not complete physical assessment at this time but no  visual muscle or fat wasting noted. Per chart review, pt's weight has been stable for the past 6 months (no weight hx available before that time).  Will order Glucerna Shake to supplement as pt seems to be tolerating fluids better than solid food. He does indicate that he has some oral discomfort with solid foods. Chart review indicates pt with dental infection and recent extraction.  Not meeting needs. Medications reviewed. Labs reviewed; CBGs: 68-219 mg/dL, Na: 133 mmol/L, BUN/creatinine elevated, Ca: 7.7 mg/dL, GFR: 56.   Diet Order:  Diet regular Room service appropriate?: Yes; Fluid consistency:: Thin  Skin:  Wound (see comment) (incision to lip)  Last BM:  9/5  Height:   Ht Readings from Last 1 Encounters:  03/11/15 6' (1.829 m)    Weight:   Wt Readings from Last 1 Encounters:  03/14/15 235 lb 14.3 oz (107 kg)    Ideal Body Weight:  80.91 kg (kg)  BMI:  Body mass index is 31.99 kg/(m^2).  Estimated Nutritional Needs:   Kcal:  2140-2340  Protein:  105-115 grams  Fluid:  2 L/day  EDUCATION NEEDS:   No education needs identified at this time     Jarome Matin, RD, LDN Inpatient Clinical Dietitian Pager # [phone] After hours/weekend pager # [phone]

## 2015-03-19 NOTE — Progress Notes (Addendum)
IP PROGRESS NOTE  Subjective:   [name] complains of shortness of breath this morning. The tooth pain has improved. He also has pain at the right arm.  Objective: Vital signs in last 24 hours: Blood pressure 178/47, pulse 141, temperature 100.3 F (37.9 C), temperature source Oral, resp. rate 24, height 6' (1.829 m), weight 235 lb 14.3 oz (107 kg), SpO2 97 %.  Intake/Output from previous day: 09/06 0701 - 09/07 0700 In: 9357 [P.O.:240; I.V.:250; IV Piggyback:800] Out: 1325 [Urine:1325]  Physical Exam:  HEENT: No thrush, no bleeding at the left lower gum extraction site Lungs: Clear except for a few coarse rhonchi at the right base, no respiratory distress Cardiac: Regular rate and rhythm Abdomen: No hepatomegaly, nontender Extremities: No leg edema, tender induration at the medial aspect of the right upper arm Neurologic: alert, follows commands   Lab Results:  Recent Labs  03/18/15 0527 03/19/15 0500  WBC 2.2* 1.7*  HGB 8.4* 8.4*  HCT 23.1* 23.2*  PLT 13* 6*   ANC 0.8 on 03/17/2015  Peripheral blood smear 03/14/2015: The white cells are decreased in number, the majority of the white cells are lymphocytes and monocytes. A few blasts and mature neutrophils. Marketed variation in red cell size and shape with numerous burr cells, target cells, and a few schistocytes. The polychromasia is not increased. The platelets are decreased in number. No platelet clumps seen.  BMET  Recent Labs  03/18/15 0527 03/19/15 0500  NA 131* 133*  K 3.6 3.8  CL 100* 102  CO2 26 24  GLUCOSE 121* 81  BUN 17 21*  CREATININE 0.85 1.21  CALCIUM 7.8* 7.7*    Studies/Results: No results found.  Medications: I have reviewed the patient's current medications.  Assessment/Plan:  1. Multiple myeloma-currently being treated with salvage Daratumumab/Decadron therapy, last treated with week #3 on 03/07/2015  2. Myelodysplasia-treated with 5 azacytidine for progressive pancytopenia, last  given June 2016  3. Severe pancytopenia secondary to multiple myeloma, myelodysplasia, and daratumumab  4. Fever-likely related to an infected tooth, possible left lung pneumonia noted on an admission chest x-ray 03/11/2015, progressive fever today  5. Pain/tenderness and inflammation at a left lower molar, tooth #18 status post extraction on 03/14/2015  6.  Chronic lung disease  7.  Poor peripheral IV access  8.  Right leg DVT noted on Doppler 03/13/2015, right brachial DVT 03/15/2015  [name] remains critically ill with pancytopenia, fever, and underlying multiple myeloma/myelodysplasia. Blasts noted in the peripheral blood smear last week are likely secondary to progression of myelodysplasia. His prognosis is poor. He has upper and lower chairman a DVTs. The fever persists despite broad-spectrum antibody. The fever may be related to a systemic infection or "tumor "fever.  I discussed hospice care briefly [name] this morning. His wife is not present. I recommend switching to a comfort care approach.   Recommendations: 1. Hospice referral for home care or transfer to [facility] 2. Hold platelet transfusions unless he has bleeding 3. I will return to see [name] in the a.m. on 03/20/2015. I will discuss the situation with his wife. 4. Stop G-CSF     LOS: 8 days   Kirra Verga  03/19/2015, 1:26 PM

## 2015-03-19 NOTE — Progress Notes (Signed)
ANTIBIOTIC CONSULT NOTE - Follow up  Pharmacy Consult for Vancomycin and Aztreonam Indication: Febrile neutropenia  Allergies  Allergen Reactions  . Penicillins Hives and Rash    Patient Measurements: Height: 6' (182.9 cm) Weight: 235 lb 14.3 oz (107 kg) IBW/kg (Calculated) : 77.6   Vital Signs: Temp: 101.1 F (38.4 C) (09/07 1300) Temp Source: Oral (09/07 1300) BP: 181/35 mmHg (09/07 1245) Pulse Rate: 142 (09/07 1245) Intake/Output from previous day: 09/06 0701 - 09/07 0700 In: 0623 [P.O.:240; I.V.:250; IV Piggyback:800] Out: 1325 [Urine:1325]  Labs:  Recent Labs  03/17/15 0520 03/18/15 0527 03/19/15 0500  WBC 2.2* 2.2* 1.7*  HGB 8.5* 8.4* 8.4*  PLT 9* 13* 6*  CREATININE 0.84 0.85 1.21   Estimated Creatinine Clearance: 64.6 mL/min (by C-G formula based on Cr of 1.21). No results for input(s): VANCOTROUGH, VANCOPEAK, VANCORANDOM, GENTTROUGH, GENTPEAK, GENTRANDOM, TOBRATROUGH, TOBRAPEAK, TOBRARND, AMIKACINPEAK, AMIKACINTROU, AMIKACIN in the last 72 hours.   Assessment: 79yo M from the [place] with suspected early sepsis, febrile, tachycardic, and neutropenic. Possible tooth infection although the patient reports this feels better after recent antibiotics. He has been on Cipro since 02/21/15. Pharmacy is asked to dose Vancomycin and Aztreonam (penicillin allergic) for febrile neutropenia, sepsis likely secondary to HCAP. Subperiosteal abscess and severe dental caries on CT mxf, now s/p dental tooth #18 extraction on 9/2.   8/30 >> Vanc >> 8/30 >> Aztreonam >>   9/1 resumed PTA acyclovir (may adjust per MD, dose ok for now) 9/4 >> Fluconazole >>  Today, 03/19/2015:  Temp: 103 (increased fever may be d/t infection, VTE, or "tumor fever")  WBC: decreased to 1.7 (last ANC 800 on 9/5) (Granix 8/30 >> )  Renal: SCr increased (0.85 > 1.21) with CrCl 65 ml/min CG, 52 ml/min N  Dose changes/levels: 9/1 0500 VT: 9 on 1 g q12 9/2 1330 VT: 16 on 1g q8h  9/7 1330 VT:  26 on 1g q8h.  Current dose discontinued (about 1/2 of dose had infused).  Suspect elevated level is d/t accumulation and slight decrease in renal function.   Goal of Therapy:  Vancomycin trough level 15-20 mcg/ml  Appropriate antibiotic dosing for renal function; eradication of infection  Plan:   Hold current Vanc dose, then reduce to Vancomycin 1g IV q12h   Recheck VT at steady state  Continue Aztreonam 2g IV q8h.  Follow up renal fxn, culture results, and clinical course.  Follow up plans for antibiotic duration.  Gretta Arab PharmD, BCPS Pager [phone] 03/19/2015 3:17 PM

## 2015-03-20 DIAGNOSIS — K047 Periapical abscess without sinus: Secondary | ICD-10-CM | POA: Insufficient documentation

## 2015-03-20 DIAGNOSIS — R0603 Acute respiratory distress: Secondary | ICD-10-CM

## 2015-03-20 DIAGNOSIS — R0600 Dyspnea, unspecified: Secondary | ICD-10-CM

## 2015-03-20 LAB — CBC WITH DIFFERENTIAL/PLATELET
Band Neutrophils: 0 % (ref 0–10)
Basophils Absolute: 0 10*3/uL (ref 0.0–0.1)
Basophils Relative: 0 % (ref 0–1)
Blasts: 36 %
EOS PCT: 3 % (ref 0–5)
Eosinophils Absolute: 0.1 10*3/uL (ref 0.0–0.7)
HEMATOCRIT: 19.2 % — AB (ref 39.0–52.0)
Hemoglobin: 7 g/dL — ABNORMAL LOW (ref 13.0–17.0)
LYMPHS ABS: 0.6 10*3/uL — AB (ref 0.7–4.0)
Lymphocytes Relative: 31 % (ref 12–46)
MCH: 31.5 pg (ref 26.0–34.0)
MCHC: 36.5 g/dL — ABNORMAL HIGH (ref 30.0–36.0)
MCV: 86.5 fL (ref 78.0–100.0)
MONOS PCT: 4 % (ref 3–12)
MYELOCYTES: 0 %
Metamyelocytes Relative: 0 %
Monocytes Absolute: 0.1 10*3/uL (ref 0.1–1.0)
NEUTROS ABS: 0.5 10*3/uL — AB (ref 1.7–7.7)
NEUTROS PCT: 26 % — AB (ref 43–77)
NRBC: 0 /100{WBCs}
Other: 0 %
Platelets: 18 10*3/uL — CL (ref 150–400)
Promyelocytes Absolute: 0 %
RBC: 2.22 MIL/uL — AB (ref 4.22–5.81)
RDW: 19.9 % — AB (ref 11.5–15.5)
WBC: 1.9 10*3/uL — AB (ref 4.0–10.5)

## 2015-03-20 LAB — GLUCOSE, CAPILLARY
GLUCOSE-CAPILLARY: 256 mg/dL — AB (ref 65–99)
Glucose-Capillary: 145 mg/dL — ABNORMAL HIGH (ref 65–99)
Glucose-Capillary: 157 mg/dL — ABNORMAL HIGH (ref 65–99)
Glucose-Capillary: 263 mg/dL — ABNORMAL HIGH (ref 65–99)

## 2015-03-20 LAB — PREPARE PLATELET PHERESIS: Unit division: 0

## 2015-03-20 MED ORDER — ACETAMINOPHEN 650 MG RE SUPP: 650.0000 mg | Freq: Three times a day (TID) | RECTAL | Status: DC

## 2015-03-20 MED ORDER — ACETAMINOPHEN 325 MG PO TABS
650.0000 mg | ORAL_TABLET | Freq: Three times a day (TID) | ORAL | Status: DC
  Administered 2015-03-20 – 2015-03-21 (×3): 650 mg via ORAL
  Filled 2015-03-20 (×3): qty 2

## 2015-03-20 MED ORDER — ALTEPLASE 2 MG IJ SOLR
2.0000 mg | Freq: Once | INTRAMUSCULAR | Status: AC
  Administered 2015-03-20: 2 mg
  Filled 2015-03-20: qty 2

## 2015-03-20 MED ORDER — MORPHINE SULFATE (PF) 2 MG/ML IV SOLN
1.0000 mg | INTRAVENOUS | Status: DC | PRN
  Administered 2015-03-21: 2 mg via INTRAVENOUS
  Filled 2015-03-20: qty 1

## 2015-03-20 MED ORDER — MORPHINE SULFATE ER 15 MG PO TBCR
15.0000 mg | EXTENDED_RELEASE_TABLET | Freq: Two times a day (BID) | ORAL | Status: DC
  Administered 2015-03-20 – 2015-03-21 (×3): 15 mg via ORAL
  Filled 2015-03-20 (×3): qty 1

## 2015-03-20 NOTE — Progress Notes (Signed)
Daily Progress Note   Patient Name: [name]       Date: 03/20/2015 DOB: 1935-07-13  Age: 79 y.o. MRN#: [id number] Attending Physician: Debbe Odea, MD Primary Care Physician: Sharion Balloon, FNP Admit Date: 03/11/2015  Reason for Consultation/Follow-up: Establishing goals of care  Subjective:  patient is resting in bed, appears sleepy tired, he is not awake alert enough to participate  Family meeting: Met with the patient's wife along with Roselyn Reef from [place]. [name] stated that she is the only caregiver for the patient at home, she recalls meeting [name] Benay Spice, she is aware that the patient is chronically ill and may not get better.  Discussed code status with [name] Pop: she believes the patient would not want to be placed on machines, he would not want to be kept alive by artificial means. Hence, she is leaning strongly towards dnr dni. Unfortunately, [name] is not in a position to be awake alert and conversant at this time.   Discussed safe appropriate D/C options. Hospice consult to [facility] and consideration for transfer to inpatient hospice in [place] was discussed in detail.   All questions answered.  Length of Stay: 9 days  Current Medications: Scheduled Meds:  . acetaminophen  650 mg Oral TID PC & HS   Or  . acetaminophen  650 mg Rectal TID PC & HS  . chlorhexidine  15 mL Mouth/Throat BID  . diclofenac sodium  2 g Topical QID  . feeding supplement (GLUCERNA SHAKE)  237 mL Oral TID BM  . insulin aspart  0-15 Units Subcutaneous TID WC  . insulin aspart  0-5 Units Subcutaneous QHS  . morphine  15 mg Oral Q12H  . pantoprazole  40 mg Oral BID  . polyethylene glycol  17 g Oral BID  . sodium chloride  10-40 mL Intracatheter Q12H  . sodium chloride  3 mL Intravenous Q12H  . tamsulosin  0.4 mg Oral QPC supper    Continuous Infusions: . lactated ringers 10 mL/hr at 03/20/15 0800    PRN Meds: clonazePAM, hydrALAZINE, iohexol, loratadine, morphine  injection, oxyCODONE, prochlorperazine, sodium chloride, sodium chloride irrigation, technetium TC 3M diethylenetriame-pentaacetic acid  Palliative Performance Scale: 20%     Vital Signs: BP 124/39 mmHg  Pulse 121  Temp(Src) 100 F (37.8 C) (Oral)  Resp 26  Ht 6' (1.829 m)  Wt 107 kg (235 lb 14.3 oz)  BMI 31.99 kg/m2  SpO2 93% SpO2: SpO2: 93 % O2 Device: O2 Device: CPAP O2 Flow Rate: O2 Flow Rate (L/min): 4 L/min  Intake/output summary:  Intake/Output Summary (Last 24 hours) at 03/20/15 1543 Last data filed at 03/20/15 1400  Gross per 24 hour  Intake   1280 ml  Output    400 ml  Net    880 ml   LBM:   Baseline Weight: Weight: 101.152 kg (223 lb) Most recent weight: Weight: 107 kg (235 lb 14.3 oz)  Physical Exam: Patient resting in bed appears chronically ill Trace edema Not fully examined      Additional Data Reviewed: Recent Labs     03/18/15  0527  03/19/15  0500  03/20/15  0600  WBC  2.2*  1.7*  1.9*  HGB  8.4*  8.4*  7.0*  PLT  13*  6*  18*  NA  131*  133*   --   BUN  17  21*   --   CREATININE  0.85  1.21   --  Problem List:  Patient Active Problem List   Diagnosis Date Noted  . Palliative care encounter   . Goals of care, counseling/discussion   . DVT (deep venous thrombosis)   . Sepsis 03/11/2015  . Neutropenic fever 03/11/2015  . Chronic diastolic congestive heart failure 03/11/2015  . Hyponatremia 03/11/2015  . Dental infection 03/11/2015  . Diabetes mellitus with complication 74/16/3845  . Anxiety state 03/11/2015  . Thrombocytopenia 03/11/2015  . Pain, dental 03/07/2015  . Diabetes mellitus without complication   . Anal skin tags 07/18/2014  . MDS (myelodysplastic syndrome) 06/17/2014  . Fatigue 02/08/2014  . Chest pain 02/08/2014  . Shoulder pain 02/08/2014  . Hypoalbuminemia due to protein-calorie malnutrition 02/08/2014  . Peripheral edema 02/08/2014  . GERD (gastroesophageal reflux disease) 08/14/2013  . Hypoxia 08/07/2013   . SOB (shortness of breath) 08/07/2013  . Pneumonia 08/07/2013  . Diastolic CHF 36/46/8032  . Chest congestion 08/06/2013  . Fever, unspecified 08/06/2013  . Low oxygen saturation 08/06/2013  . Asthma with COPD 08/06/2013  . Recurrent isolated sleep paralysis 06/11/2013  . Dyspnea on exertion 06/11/2013  . Seasonal allergic rhinitis 03/23/2013  . Hemorrhoids, internal, with bleeding and grade 2 prolapse 03/09/2013  . Other malaise and fatigue 01/31/2013  . Essential hypertension, benign 11/06/2012  . Mixed hyperlipidemia 11/06/2012  . Constipation 03/06/2012  . Personal history of colonic adenoma 03/06/2012  . Renal insufficiency 11/09/2011  . Lumbar disc herniation with radiculopathy 11/01/2011  . Insomnia with sleep apnea 06/06/2011  . Obstructive sleep apnea 03/08/2011  . Multiple myeloma 02/19/2008     Palliative Care Assessment & Plan    Code Status:  Full code for now, will attempt to discuss directly with [name] if possible on 03-21-15  Goals of Care:  Hospice consult   Desire for further Chaplaincy support:no  3. Symptom Management:   continue current management.   4. Palliative Prophylaxis:  Stool Softener: yes  5. Prognosis: < 2 weeks  5. Discharge Planning: Hospice facility   Care plan was discussed with  Patient's wife [name] and York Cerise from [place]  Thank you for allowing the Palliative Medicine Team to assist in the care of this patient.   Time In: 1500 Time Out: 1535 Total Time 35 Prolonged Time Billed  no     Greater than 50%  of this time was spent counseling and coordinating care related to the above assessment and plan.  [name] Shevlin Burt Knack, MD  03/20/2015, 3:43 PM  [phone]   Please contact Palliative Medicine Team phone at [phone] for questions and concerns.

## 2015-03-20 NOTE — Progress Notes (Signed)
IP PROGRESS NOTE  Subjective:   [name] continues to have shortness of breath and right arm pain. He and his wife are discussing disposition plans.  Objective: Vital signs in last 24 hours: Blood pressure 129/45, pulse 144, temperature 102.7 F (39.3 C), temperature source Oral, resp. rate 17, height 6' (1.829 m), weight 235 lb 14.3 oz (107 kg), SpO2 93 %.  Intake/Output from previous day: 09/07 0701 - 09/08 0700 In: 1213.8 [I.V.:240; Blood:333.8; IV Piggyback:600] Out: 200 [Urine:200]  Physical Exam:  HEENT: No thrush, no bleeding  Lungs: Clear anteriorly Cardiac: Regular rate and rhythm, tachycardia Abdomen: No hepatomegaly, nontender Extremities: No leg edema, tender induration at the medial aspect of the right upper arm, pain with movement of the right arm Neurologic: alert, follows commands   Lab Results:  Recent Labs  03/19/15 0500 03/20/15 0600  WBC 1.7* 1.9*  HGB 8.4* 7.0*  HCT 23.2* 19.2*  PLT 6* 18*   ANC 0.5  Peripheral blood smear 03/14/2015: The white cells are decreased in number, the majority of the white cells are lymphocytes and monocytes. A few blasts and mature neutrophils. Marketed variation in red cell size and shape with numerous burr cells, target cells, and a few schistocytes. The polychromasia is not increased. The platelets are decreased in number. No platelet clumps seen.  BMET  Recent Labs  03/18/15 0527 03/19/15 0500  NA 131* 133*  K 3.6 3.8  CL 100* 102  CO2 26 24  GLUCOSE 121* 81  BUN 17 21*  CREATININE 0.85 1.21  CALCIUM 7.8* 7.7*    Studies/Results: Dg Chest Port 1 View  03/19/2015   CLINICAL DATA:  Respiratory distress.  EXAM: PORTABLE CHEST - 1 VIEW  COMPARISON:  March 16, 2015.  FINDINGS: The heart size and mediastinal contours are within normal limits. No pneumothorax or pleural effusion is noted. Left-sided PICC line is unchanged with distal tip overlying expected position of the SVC. Probable scarring or  subsegmental atelectasis is noted in right midlung. Interval development of new opacity seen in left midlung concerning for subsegmental atelectasis. The visualized skeletal structures are unremarkable.  IMPRESSION: Interval development of small opacity in left midlung most consistent with subsegmental atelectasis. No other significant abnormality seen in the chest.   Electronically Signed   By: Marijo Conception, M.D.   On: 03/19/2015 15:53    Medications: I have reviewed the patient's current medications.  Assessment/Plan:  1. Multiple myeloma-most recently treated with salvage Daratumumab/Decadron therapy, last treated with week #3 on 03/07/2015  2. Myelodysplasia-treated with 5 azacytidine for progressive pancytopenia, last given June 2016  3. Severe pancytopenia secondary to multiple myeloma, myelodysplasia, and daratumumab  4. Fever-persistent, potentially related to pneumonia, thrombophlebitis, or tumor fever   5. Pain/tenderness and inflammation at a left lower molar, tooth #18 status post extraction on 03/14/2015  6.  Chronic lung disease  7.  Poor peripheral IV access, status post PICC placement  8.  Right leg DVT noted on Doppler 03/13/2015, right brachial DVT 03/15/2015  [name] remains ill with a fever, severe pancytopenia, and thrombophlebitis. I recommend comfort care and Hospice. I discussed home hospice versus placement with hospice with [name] and his wife. I feel he would be a good candidate for the [facility] or [facility]. We also discussed CPR and ACLS issues. He was unable to decide on a CODE STATUS. I think he would benefit from a palliative care discussion, but Ms. Wilbourne declines this at present.  Recommendations: 1. Hospice referral for home care or transfer to residential Hospice 2. Hold platelet transfusions unless he has bleeding 3. Continue discussions regarding CODE STATUS and recommend palliative care discussion. 4. Consider adding  scheduled Tylenol or naproxen for palliation of fever 5. Increase morphine dose   I will be out until 03/31/2015. Dr.Ennever will follow [name] beginning 03/21/2015.     LOS: 9 days   [place]  03/20/2015, 11:13 AM

## 2015-03-20 NOTE — Clinical Social Work Note (Signed)
Clinical Social Work Assessment  Patient Details  Name: [name] MRN: [id number] Date of Birth: [birth year]  Date of referral:  03/20/15               Reason for consult:  Discharge Planning                Permission sought to share information with:  Chartered certified accountant granted to share information::  Yes, Verbal Permission Granted  Name::        Agency::     Relationship::     Contact Information:     Housing/Transportation Living arrangements for the past 2 months:  Single Family Home Source of Information:  Spouse Patient Interpreter Needed:  None Criminal Activity/Legal Involvement Pertinent to Current Situation/Hospitalization:  No - Comment as needed Significant Relationships:    Lives with:  Adult Children, Spouse Do you feel safe going back to the place where you live?   ([place] is recommended.) Need for family participation in patient care:  Yes (Comment)  Care giving concerns: Pt's care cannot be managed at home following hospital d/c.   Social Worker assessment / plan:  Pt hospitalized on 03/11/15 with sepsis ( likely secondary to HCAP from ongoing chemotherapy, MD notes ). CSW met with pt's spouse and Palliative Care to assist with d/c planning. Pt was sleeping soundly during visit. Spouse has accepted plan for Residential Hospice placement and feels her husband will also be in agreement with this plan. Code Status discussed with spouse. MD will return to attempt to have this discussion with pt. Spouse is interested in [facility]. CSW has contacted hospice home and will send referral once DNR is in place. Employment status:  Retired Nurse, adult PT Recommendations:  Not assessed at this time Information / Referral to community resources:  Other (Comment Required) (Residential Hospice info provided.)  Patient/Family's Response to care:  Spouse wants her husband to be well cared for and she  realizes she cannot provide this care on her own. She now accepts residential hospice home placement for spouse.  Patient/Family's Understanding of and Emotional Response to Diagnosis, Current Treatment, and Prognosis:  After much discussion, spouse appears to  understands that pt will not recover from his illness. " I want my husband to be taken care of. I can't do that alone. I know he wants to go home but that's not what's best right now."  Spouse feels her husband would support residential placement decision.  Emotional Assessment Appearance:  Appears older than stated age Attitude/Demeanor/Rapport:  Other (cooperative) Affect (typically observed):  Other (Sleepy, tiired) Orientation:  Oriented to Self, Oriented to Place Alcohol / Substance use:  Not Applicable Psych involvement (Current and /or in the community):  No (Comment)  Discharge Needs  Concerns to be addressed:  Discharge Planning Concerns Readmission within the last 30 days:  No Current discharge risk:  None Barriers to Discharge:  No Barriers Identified   [name], [place] 03/20/2015, 4:01 PM

## 2015-03-20 NOTE — Progress Notes (Signed)
[facility] Progress Note   [name]  QAS:[id number]  DOB: Feb 24, 1936  DOA: 03/11/2015 PCP: Sharion Balloon, FNP  Brief narrative: [name] is a 79 y.o. male with multiple myeloma, myelodysplastic syndrome, hypercoagulable state, significant thrombocytopenia, diabetes mellitus on oral medication admitted with neutropenic fever from a dental infection and a possible pneumonia. He was given granix for leukopenia. He underwent extraction of tooth (left lower molar) with alveoloplasty on 9/2. And then developed a fever of 102.6 on 9/4 despite being on vancomycin and aztreonam. Fluconazole was added however, he continues to have fevers. He also has a right popliteal DVT for which she received an IVC filter on 9/3. He was subsequently had swelling of his right arm was found to have a right brachial DVT on 9/3. Due to thrombocytopenia, he is not able to be anticoagulated.   Subjective: Complains of pain in his right leg today. No other complaints.  Assessment/Plan: Principal Problem:   Sepsis/neutropenic fever -Spectrum to be secondary to HCAP and dental infection-status post dental extraction on 9/2 -Has had over a week of antibiotics (started 9/1) and is currently receiving fluconazole, acyclovir, Azactam and vancomycin but continues to spike fevers-oncology suspects that fevers are likely secondary to underlying cancer -Palliative care has been called to speak with patient and wife but they refused hospice -  Dr. Learta Codding is advising hospice home which I'm in agreement with as well- he is too weak to get out of bed on his own -I had an extensive conversation with the patient and his wife again today to explain his poor prognosis once again-palliative care has spoken with them as well. -Plan is for patient to go to hospice home -CODE STATUS to be addressed tomorrow to palliative care-as of today the patient was still telling me he wanted to be a full code  Acute respiratory  distress -Currently tachypnea and tachycardic likely due to ongoing fevers and pain - chest x-ray obtained yesterday negative for pulmonary edema  Multiple myeloma and myelodysplasia -Dr. Benay Spice does not recommend any further chemotherapy  Generalized pain -He has been using IV morphine frequently-will transition to MS Contin  Pancytopenia -Platelets down to 6 again today and therefore I transfuse one unit of packed red blood cells-Dr. Benay Spice recommends holding off on platelet transfusion unless he is bleeding -Granix discontinued by Dr. Benay Spice  Right arm DVT/ right leg DVT -IVC filter for right leg DVT -Due to severe thrombocytopenia, not a candidate for anticoagulation  Diabetes mellitus -Hold and Invokana and metformin and continue sliding scale  Chronic diastolic heart failure - compensated  BPH -Continue Flomax  Anxiety -Continue when necessary Klonopin   Code Status:     Code Status Orders        Start     Ordered   03/14/15 0936  Full code   Continuous     03/14/15 0936     Family Communication: With wife at bedside and daughter Lattie Haw Disposition Plan: Hospice-peripherally [facility] DVT prophylaxis: SCDs Consultants: Oncology, palliative, dental surgery Procedures:. 9/2 Extraction of tooth #18 with alveoloplasty PICC line  Antibiotics: Anti-infectives    Start     Dose/Rate Route Frequency Ordered Stop   03/20/15 0000  vancomycin (VANCOCIN) IVPB 1000 mg/200 mL premix  Status:  Discontinued     1,000 mg 200 mL/hr over 60 Minutes Intravenous Every 12 hours 03/19/15 1526 03/20/15 1246   03/16/15 1000  fluconazole (DIFLUCAN) IVPB 100 mg  Status:  Discontinued     100 mg 50  mL/hr over 60 Minutes Intravenous Every 24 hours 03/16/15 0826 03/20/15 1246   03/13/15 1130  acyclovir (ZOVIRAX) tablet 400 mg  Status:  Discontinued     400 mg Oral 2 times daily 03/13/15 1007 03/20/15 1246   03/13/15 0600  vancomycin (VANCOCIN) IVPB 1000 mg/200 mL premix   Status:  Discontinued     1,000 mg 200 mL/hr over 60 Minutes Intravenous Every 8 hours 03/13/15 0526 03/19/15 1522   03/12/15 0600  vancomycin (VANCOCIN) IVPB 1000 mg/200 mL premix  Status:  Discontinued     1,000 mg 200 mL/hr over 60 Minutes Intravenous Every 12 hours 03/11/15 1529 03/13/15 0526   03/11/15 1600  aztreonam (AZACTAM) 2 g in dextrose 5 % 50 mL IVPB  Status:  Discontinued     2 g 100 mL/hr over 30 Minutes Intravenous 3 times per day 03/11/15 1529 03/20/15 1246   03/11/15 1600  vancomycin (VANCOCIN) 1,500 mg in sodium chloride 0.9 % 500 mL IVPB     1,500 mg 250 mL/hr over 120 Minutes Intravenous  Once 03/11/15 1529 03/11/15 1826      Objective: Filed Weights   03/11/15 1635 03/14/15 0438  Weight: 101.152 kg (223 lb) 107 kg (235 lb 14.3 oz)    Intake/Output Summary (Last 24 hours) at 03/20/15 1632 Last data filed at 03/20/15 1400  Gross per 24 hour  Intake   1270 ml  Output    400 ml  Net    870 ml     Vitals Filed Vitals:   03/20/15 1155 03/20/15 1200 03/20/15 1300 03/20/15 1400  BP:  124/39    Pulse:  130 129 121  Temp: 100 F (37.8 C)     TempSrc: Oral     Resp:  $Remo'24 28 26  'woyEz$ Height:      Weight:      SpO2:  94% 89% 93%    Exam:  General:  Pt is alert, rapid respiratory rate noted  HEENT: No icterus, No thrush, oral mucosa moist  Cardiovascular: regular rate and rhythm, S1/S2 No murmur, tachycardia  Respiratory: clear to auscultation bilaterally -respiratory rate in high 20s  Abdomen: Soft, +Bowel sounds, non tender, non distended, no guarding  MSK: No LE edema, cyanosis or clubbing- left hand is swollen  Data Reviewed: Basic Metabolic Panel:  Recent Labs Lab 03/15/15 1717 03/16/15 0600 03/17/15 0520 03/18/15 0527 03/19/15 0500  NA 129* 132* 132* 131* 133*  K 3.9 3.5 3.7 3.6 3.8  CL 101 101 102 100* 102  CO2 $Re'23 22 23 26 24  'sKp$ GLUCOSE 211* 82 132* 121* 81  BUN 22* $Remov'19 18 17 'dXwcHh$ 21*  CREATININE 0.96 0.88 0.84 0.85 1.21  CALCIUM 7.4* 7.7*  7.8* 7.8* 7.7*   Liver Function Tests:  Recent Labs Lab 03/19/15 0500  AST 62*  ALT 41  ALKPHOS 80  BILITOT 1.2  PROT 5.8*  ALBUMIN 1.7*   No results for input(s): LIPASE, AMYLASE in the last 168 hours. No results for input(s): AMMONIA in the last 168 hours. CBC:  Recent Labs Lab 03/14/15 0511 03/15/15 0600 03/16/15 0600 03/17/15 0520 03/18/15 0527 03/19/15 0500 03/20/15 0600  WBC 2.1* 2.3* 2.5* 2.2* 2.2* 1.7* 1.9*  NEUTROABS 0.7* 0.5* 0.3* 0.8*  --   --  0.5*  HGB 8.4* 9.1* 9.4* 8.5* 8.4* 8.4* 7.0*  HCT 22.9* 24.7* 26.0* 23.4* 23.1* 23.2* 19.2*  MCV 87.7 88.2 88.4 87.3 87.8 87.5 86.5  PLT 36* 43* 19* 9* 13* 6* 18*   Cardiac Enzymes: No  results for input(s): CKTOTAL, CKMB, CKMBINDEX, TROPONINI in the last 168 hours. BNP (last 3 results) No results for input(s): BNP in the last 8760 hours.  ProBNP (last 3 results) No results for input(s): PROBNP in the last 8760 hours.  CBG:  Recent Labs Lab 03/19/15 1301 03/19/15 1549 03/19/15 2137 03/20/15 0840 03/20/15 1218  GLUCAP 78 110* 196* 145* 157*    Recent Results (from the past 240 hour(s))  Urine Culture     Status: None   Collection Time: 03/11/15 11:45 AM  Result Value Ref Range Status   Urine Culture, Routine Culture, Urine  Final    Comment: Final - ===== COLONY COUNT: ===== NO GROWTH NO GROWTH   Culture, blood (routine x 2)     Status: None   Collection Time: 03/11/15  3:23 PM  Result Value Ref Range Status   Specimen Description BLOOD LEFT WRIST  Final   Special Requests IN PEDIATRIC BOTTLE 3CC  Final   Culture   Final    NO GROWTH 5 DAYS Performed at [facility]    Report Status 03/16/2015 FINAL  Final  Culture, blood (routine x 2)     Status: None   Collection Time: 03/11/15  3:44 PM  Result Value Ref Range Status   Specimen Description BLOOD LEFT ANTECUBITAL  Final   Special Requests BOTTLES DRAWN AEROBIC AND ANAEROBIC 5CC  Final   Culture   Final    NO GROWTH 5  DAYS Performed at [facility]    Report Status 03/16/2015 FINAL  Final  Urine culture     Status: None   Collection Time: 03/11/15  5:18 PM  Result Value Ref Range Status   Specimen Description URINE, CLEAN CATCH  Final   Special Requests NONE  Final   Culture   Final    3,000 COLONIES/mL INSIGNIFICANT GROWTH Performed at [facility]    Report Status 03/12/2015 FINAL  Final  MRSA PCR Screening     Status: None   Collection Time: 03/11/15  6:28 PM  Result Value Ref Range Status   MRSA by PCR NEGATIVE NEGATIVE Final    Comment:        The GeneXpert MRSA Assay (FDA approved for NASAL specimens only), is one component of a comprehensive MRSA colonization surveillance program. It is not intended to diagnose MRSA infection nor to guide or monitor treatment for MRSA infections.   Urine culture     Status: None   Collection Time: 03/11/15  9:30 PM  Result Value Ref Range Status   Specimen Description URINE, CLEAN CATCH  Final   Special Requests NONE  Final   Culture   Final    NO GROWTH 1 DAY Performed at [facility]    Report Status 03/13/2015 FINAL  Final     Studies: Dg Chest Port 1 View  03/19/2015   CLINICAL DATA:  Respiratory distress.  EXAM: PORTABLE CHEST - 1 VIEW  COMPARISON:  March 16, 2015.  FINDINGS: The heart size and mediastinal contours are within normal limits. No pneumothorax or pleural effusion is noted. Left-sided PICC line is unchanged with distal tip overlying expected position of the SVC. Probable scarring or subsegmental atelectasis is noted in right midlung. Interval development of new opacity seen in left midlung concerning for subsegmental atelectasis. The visualized skeletal structures are unremarkable.  IMPRESSION: Interval development of small opacity in left midlung most consistent with subsegmental atelectasis. No other significant abnormality seen in the chest.   Electronically  Signed   By: Marijo Conception, M.D.   On:  03/19/2015 15:53    Scheduled Meds:  Scheduled Meds: . acetaminophen  650 mg Oral TID PC & HS   Or  . acetaminophen  650 mg Rectal TID PC & HS  . chlorhexidine  15 mL Mouth/Throat BID  . diclofenac sodium  2 g Topical QID  . feeding supplement (GLUCERNA SHAKE)  237 mL Oral TID BM  . insulin aspart  0-15 Units Subcutaneous TID WC  . insulin aspart  0-5 Units Subcutaneous QHS  . morphine  15 mg Oral Q12H  . pantoprazole  40 mg Oral BID  . polyethylene glycol  17 g Oral BID  . sodium chloride  10-40 mL Intracatheter Q12H  . sodium chloride  3 mL Intravenous Q12H  . tamsulosin  0.4 mg Oral QPC supper   Continuous Infusions: . lactated ringers 10 mL/hr at 03/20/15 0800    Time spent on care of this patient: 71 min   [place], MD 03/20/2015, 4:32 PM  LOS: 9 days   [facility] Office  [phone] Pager - Text Page per [email or web address] If 7PM-7AM, please contact night-coverage [email or web address]

## 2015-03-20 NOTE — Progress Notes (Addendum)
CSW consulted to assist with d/c planning. PN reviewed. Spoke with Hospitalist. Pt is sleeping soundly at this time. No family at bedside. PT eval / recommendations are pending.   Pt has [employer] which requires prior approval for [facility]. CSW will return later this afternoon to meet with pt / spouse to review d/c options.  Werner Lean LCSW [phone]  [phone] : Met with pt briefly to offer assistance with d/c planning. Spouse is due back to hospital later this afternoon. Pt would like CSW to return when spouse is present. NSG to contact Crystal when spouse returns.  Werner Lean LCSW [phone]

## 2015-03-21 ENCOUNTER — Ambulatory Visit: Payer: Self-pay

## 2015-03-21 DIAGNOSIS — K045 Chronic apical periodontitis: Secondary | ICD-10-CM

## 2015-03-21 LAB — GLUCOSE, CAPILLARY
GLUCOSE-CAPILLARY: 121 mg/dL — AB (ref 65–99)
Glucose-Capillary: 127 mg/dL — ABNORMAL HIGH (ref 65–99)
Glucose-Capillary: 189 mg/dL — ABNORMAL HIGH (ref 65–99)

## 2015-03-21 MED ORDER — HEPARIN SOD (PORK) LOCK FLUSH 100 UNIT/ML IV SOLN
250.0000 [IU] | INTRAVENOUS | Status: DC | PRN
  Filled 2015-03-21: qty 5

## 2015-03-21 MED ORDER — POLYETHYLENE GLYCOL 3350 17 G PO PACK: 17.0000 g | PACK | Freq: Two times a day (BID) | ORAL | Status: AC

## 2015-03-21 MED ORDER — ACETAMINOPHEN 325 MG PO TABS: 650.0000 mg | ORAL_TABLET | Freq: Three times a day (TID) | ORAL | Status: AC

## 2015-03-21 MED ORDER — HEPARIN SOD (PORK) LOCK FLUSH 100 UNIT/ML IV SOLN
250.0000 [IU] | Freq: Every day | INTRAVENOUS | Status: DC
  Administered 2015-03-21: 250 [IU]
  Filled 2015-03-21: qty 5

## 2015-03-21 MED ORDER — MORPHINE SULFATE ER 15 MG PO TBCR: 15.0000 mg | EXTENDED_RELEASE_TABLET | Freq: Two times a day (BID) | ORAL | Status: AC

## 2015-03-21 MED ORDER — OXYCODONE HCL 5 MG PO TABS: 5.0000 mg | ORAL_TABLET | ORAL | Status: AC | PRN

## 2015-03-21 MED ORDER — CLONAZEPAM 0.5 MG PO TABS: 0.5000 mg | ORAL_TABLET | Freq: Two times a day (BID) | ORAL | Status: AC | PRN

## 2015-03-21 MED ORDER — GLUCERNA SHAKE PO LIQD: 237.0000 mL | Freq: Three times a day (TID) | ORAL | Status: AC

## 2015-03-21 NOTE — Progress Notes (Signed)
Report given to nurse Maudie Mercury at [facility]. And nurse mentioned to nurse to keep PICC line for IV pain meds.

## 2015-03-21 NOTE — Progress Notes (Signed)
Patient d/c to Hospice at [facility] via ambulance. Patient is alert. IV Morphine 2 mg given before patient was d/c. Wife present in the room.

## 2015-03-21 NOTE — Discharge Summary (Signed)
Physician Discharge Summary  Shaquill Iseman KNL:[id number] DOB: July 08, 1936 DOA: 03/11/2015  PCP: Sharion Balloon, FNP  Admit date: 03/11/2015 Discharge date: 03/21/2015  Time spent: 60 minutes  Recommendations for Outpatient Follow-up:  1. Will be discharged to hospice home 2. Uses CPAP while asleep 3. Continue around the clock Tyelenol for control of fevers 4. Titrate MS Contin (started last night)  Discharge Condition: fair Diet recommendation: diet as tolerated-   Discharge Diagnoses:  Principal Problem:   Sepsis Active Problems:   MDS (myelodysplastic syndrome)   Neutropenic fever   Multiple myeloma   GERD (gastroesophageal reflux disease)   Chronic diastolic congestive heart failure   Hyponatremia   Dental infection   Diabetes mellitus with complication   Anxiety state   Thrombocytopenia   DVT (deep venous thrombosis)   Palliative care encounter   Goals of care, counseling/discussion   Respiratory distress   Tooth abscess   History of present illness:  [name] is a 79 y.o. male with multiple myeloma, myelodysplastic syndrome, hypercoagulable state, significant thrombocytopenia, diabetes mellitus on oral medication admitted with neutropenic fever from a dental infection and a possible pneumonia. He was given granix for leukopenia. He underwent extraction of tooth (left lower molar) with alveoloplasty on 9/2 and then developed a fever of 102.6 on 9/4 despite being on vancomycin and aztreonam. Acyclovir and Fluconazole were added however, he continues to have fevers. He also has a right popliteal DVT for which she received an IVC filter on 9/3. He was subsequently had swelling of his right arm was found to have a right brachial DVT on 9/3. Due to thrombocytopenia, he is not able to be anticoagulated.  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 Learta Codding, his oncologist feels that his prognosis is poor and his recommending that chemotherapy be discontinued-he is advising hospice home which I'm in agreement with as well- he is too weak to get out of bed on his own -Patient and wife were quite hesitant to accept palliative care and hospice and after multiple conversations with Dr. Benay Spice, myself and palliative care attendings, they have realized that the prognosis is poor and have decided that he would be best served at a hospice home   Tachypnea/ tachycardia- respiratory distress - has been tachypnea and tachycardic likely due to ongoing fevers and pain - chest x-ray obtained 9/7- negative for pulmonary edema  Generalized pain -He has been using IV morphine frequently for pain mostly in right arm but also has various other areas of pain on a daily basis - transitioned to MS Contin last night - appears to be doing well with it thus far  Pancytopenia -Despite being given Granix WBC count failing to improve-blood and numerous platelet transfusions also given-at this point oncology recommends holding off on transfusions- see above  Right arm DVT/ right leg DVT -IVC filter for right leg DVT -Due to severe thrombocytopenia, not a candidate for anticoagulation  Diabetes mellitus -Hold and Invokana and metformin  Chronic diastolic heart failure - compensated  BPH -Continue  Flomax- currently has foley but if it begins to irritate him, may need to remove it - therefore, recommend continued Flomax  Anxiety -Continue  Klonopin   Consultants: Oncology, palliative, dental surgery Procedures:. 9/2 Extraction of tooth #18 with alveoloplasty PICC line  Discharge Exam: Filed Weights   03/11/15 1635 03/14/15 0438  Weight: 101.152 kg (223 lb) 107 kg (235 lb 14.3 oz)   Filed Vitals:   03/21/15 0533  BP: 108/61  Pulse: 130  Temp: 98.2 F (36.8 C)  Resp: 20    General: AAO x 3, no distress Cardiovascular: RRR, no murmurs  Respiratory: clear to auscultation bilaterally GI: soft, non-tender, non-distended, bowel sound positive  Discharge Instructions You were cared for by a hospitalist during your hospital stay. If you have any questions about your discharge medications or the care you received while you were in the hospital after you are discharged, you can call the unit and asked to speak with the hospitalist on call if the hospitalist that took care of you is not available. Once you are discharged, your primary care physician will handle any further medical issues. Please note that NO REFILLS for any discharge medications will be authorized once you are discharged, as it is imperative that you return to your primary care physician (or establish a relationship with a primary care physician if you do not have one) for your aftercare needs so that they can reassess your need for medications and monitor your lab values.     Medication List    STOP taking these medications        acyclovir 400 MG tablet  Commonly known as:  ZOVIRAX     atorvastatin 20 MG tablet  Commonly known as:  LIPITOR     canagliflozin 100 MG Tabs tablet  Commonly known as:  INVOKANA     glucose blood test strip  Commonly known as:  ONETOUCH VERIO     KLOR-CON M20 20 MEQ tablet  Generic drug:  potassium chloride SA     loratadine 10 MG tablet  Commonly known as:  CLARITIN     montelukast 10 MG tablet  Commonly known as:  SINGULAIR     ONETOUCH DELICA LANCETS FINE Misc     polyethylene glycol powder powder   Commonly known as:  GLYCOLAX/MIRALAX  Replaced by:  polyethylene glycol packet     PRESCRIPTION MEDICATION     traMADol 50 MG tablet  Commonly known as:  ULTRAM      TAKE these medications        acetaminophen 325 MG tablet  Commonly known as:  TYLENOL  Take 2 tablets (650 mg total) by mouth 4 (four) times daily - after meals and at bedtime.     albuterol 108 (90 BASE) MCG/ACT inhaler  Commonly known as:  PROVENTIL HFA;VENTOLIN HFA  Inhale 2 puffs into the lungs every 6 (six) hours as needed for wheezing or shortness of breath.     clonazePAM 0.5 MG tablet  Commonly known as:  KLONOPIN  Take 1 tablet (0.5 mg total) by mouth 3 times/day as needed-between meals & bedtime (sleep).     dexamethasone 4 MG tablet  Commonly known as:  DECADRON  Take 1 tablet (4 mg total) by mouth 2 (two) times daily with a meal. For 2 days. Begin day after each chemo treatment.     diclofenac sodium 1 % Gel  Commonly known as:  VOLTAREN  Apply 2 g topically 4 (four) times daily.  feeding supplement (GLUCERNA SHAKE) Liqd  Take 237 mLs by mouth 3 (three) times daily between meals.     morphine 15 MG 12 hr tablet  Commonly known as:  MS CONTIN  Take 1 tablet (15 mg total) by mouth every 12 (twelve) hours.     oxyCODONE 5 MG immediate release tablet  Commonly known as:  Oxy IR/ROXICODONE  Take 1 tablet (5 mg total) by mouth every 4 (four) hours as needed for moderate pain.     pantoprazole 40 MG tablet  Commonly known as:  PROTONIX  Take 1 tablet (40 mg total) by mouth 2 (two) times daily.     polyethylene glycol packet  Commonly known as:  MIRALAX / GLYCOLAX  Take 17 g by mouth 2 (two) times daily.     prochlorperazine 10 MG tablet  Commonly known as:  COMPAZINE  TAKE 1 TABLET BY MOUTH EVERY SIX HOURS AS NEEDED     tamsulosin 0.4 MG Caps capsule  Commonly known as:  FLOMAX  TAKE 1 CAPSULE (0.4 MG TOTAL) BY MOUTH 2 (TWO) TIMES DAILY. WITH A MEAL.       Allergies  Allergen  Reactions  . Penicillins Hives and Rash      The results of significant diagnostics from this hospitalization (including imaging, microbiology, ancillary and laboratory) are listed below for reference.    Significant Diagnostic Studies: Dg Chest 2 View  03/11/2015   CLINICAL DATA:  Fever with weakness. Possible sepsis from tooth infection. Multiple myeloma. Initial encounter.  EXAM: CHEST  2 VIEW  COMPARISON:  02/28/2015 and 11/21/2014 radiographs.  FINDINGS: The heart size and mediastinal contours are stable. There are slightly lower lung volumes. On today's lateral view, there is increased density in the retrosternal clear space which may correspond with faint lingular opacity on the frontal examination. There is no consolidation or significant pleural effusion. Telemetry leads overlie the chest. Previous lower cervical fusion noted. There are lucencies in the proximal right humerus attributed to the patient's multiple myeloma.  IMPRESSION: Possible developing lingular pneumonia.   Electronically Signed   By: Richardean Sale M.D.   On: 03/11/2015 15:42   Dg Chest 2 View  02/28/2015   CLINICAL DATA:  Shortness of breath, mid left-sided chest pain for the past week, history of multiple myeloma, diabetes, and remote history of smoking  EXAM: CHEST  2 VIEW  COMPARISON:  PA lateral chest x-ray of Nov 21, 2014  FINDINGS: The lungs are well-expanded with hemidiaphragm flattening. There is no focal infiltrate. There is minimal stable blunting of the right lateral costophrenic angle. The heart and pulmonary vascularity are normal. The mediastinum is normal in width. The bony thorax exhibits no acute abnormality.  IMPRESSION: There is no CHF or pneumonia. Borderline hyperinflation with hemidiaphragm flattening is chronic and may reflect underlying COPD.   Electronically Signed   By: David  Martinique M.D.   On: 02/28/2015 09:52   Ir Ivc Filter Plmt / S&i /img Guid/mod Sed  03/15/2015   INDICATION: 79 year old  male with acute right popliteal and right brachial DVT in the setting of multiple myeloma, myelodysplastic syndrome and thrombocytopenia. He is not a candidate for anticoagulation therapy and therefore caval interruption is warranted for PE prophylaxis.  EXAM: ULTRASOUND GUIDANCE FOR VASCULARACCESS  IVC CATHETERIZATION AND VENOGRAM  IVC FILTER INSERTION  COMPARISON:  None.  MEDICATIONS: Fentanyl 50 mcg IV; Versed 1 mg IV  ANESTHESIA/SEDATION: Sedation Time  11 minutes  CONTRAST:  44m OMNIPAQUE IOHEXOL 300 MG/ML  SOLN  FLUOROSCOPY TIME:  1 minutes 24 seconds  062 mGy  COMPLICATIONS: None immediate  PROCEDURE: Informed consent was obtained from the patient following explanation of the procedure, risks, benefits and alternatives. The patient understands, agrees and consents for the procedure. All questions were addressed. A time out was performed prior to the initiation of the procedure.  Maximal barrier sterile technique utilized including caps, mask, sterile gowns, sterile gloves, large sterile drape, hand hygiene, and Betadine prep.  Under sterile condition and local anesthesia, right internal jugular venous access was performed with ultrasound. An ultrasound image was saved and sent to PACS. Over a guidewire, the IVC filter delivery sheath and inner dilator were advanced into the IVC just above the IVC bifurcation. Contrast injection was performed for an IVC venogram.  Through the delivery sheath, a retrievable Denali IVC filter was deployed below the level of the renal veins and above the IVC bifurcation. Limited post deployment venacavagram was performed.  The delivery sheath was removed and hemostasis was obtained with manual compression. A dressing was placed. The patient tolerated the procedure well without immediate post procedural complication.  FINDINGS: The IVC is patent. No evidence of thrombus, stenosis, or occlusion. No variant venous anatomy. Successful placement of the IVC filter below the level of  the renal veins.  IMPRESSION: Successful ultrasound and fluoroscopically guided placement of an infrarenal retrievable IVC filter via right jugular approach.  Due to patient related comorbidities and/or clinical necessity, this IVC filter should be considered a permanent device. This patient will not be actively followed for future filter retrieval.  Signed,  Criselda Peaches, MD  [facility]  [facility]   Electronically Signed   By: Jacqulynn Cadet M.D.   On: 03/15/2015 11:46   Nm Pulmonary Perf And Vent  03/12/2015   CLINICAL DATA:  Difficulty breathing  EXAM: NUCLEAR MEDICINE VENTILATION - PERFUSION LUNG SCAN  Views: Anterior, posterior, left lateral, right lateral, RPO, LPO, RAO, LAO -ventilation and perfusion  Radionuclide: Technetium 36mDTPA -ventilation; Technetium 969macroaggregated albumin-perfusion  Dose:  42.3 mCi-ventilation; 5.4 mCi-perfusion  Route of administration: Inhalation -ventilation ; intravenous -perfusion  COMPARISON:  Chest radiograph March 11, 2015  FINDINGS: Ventilation: Radiotracer uptake is homogeneous and symmetric bilaterally.  Perfusion: Radiotracer uptake is homogeneous and symmetric bilaterally.  IMPRESSION: There are no appreciable ventilation or perfusion defects. This study constitutes a very low probability of pulmonary embolus.   Electronically Signed   By: WiLowella GripII M.D.   On: 03/12/2015 12:04   Dg Chest Port 1 View  03/19/2015   CLINICAL DATA:  Respiratory distress.  EXAM: PORTABLE CHEST - 1 VIEW  COMPARISON:  March 16, 2015.  FINDINGS: The heart size and mediastinal contours are within normal limits. No pneumothorax or pleural effusion is noted. Left-sided PICC line is unchanged with distal tip overlying expected position of the SVC. Probable scarring or subsegmental atelectasis is noted in right midlung. Interval development of new opacity seen in left midlung concerning for subsegmental  atelectasis. The visualized skeletal structures are unremarkable.  IMPRESSION: Interval development of small opacity in left midlung most consistent with subsegmental atelectasis. No other significant abnormality seen in the chest.   Electronically Signed   By: JaMarijo ConceptionM.D.   On: 03/19/2015 15:53   Dg Chest Port 1 View  03/16/2015   CLINICAL DATA:  Generalized pain.  Hypertension.  EXAM: PORTABLE CHEST - 1 VIEW  COMPARISON:  03/11/2015  FINDINGS: There is a left upper extremity PICC  line with tip in the low SVC. The lungs are grossly clear. There is no large effusion. Pulmonary vasculature is normal.  IMPRESSION: No active disease.   Electronically Signed   By: Andreas Newport M.D.   On: 03/16/2015 05:50   Ct Maxillofacial Wo Cm  03/12/2015   CLINICAL DATA:  Pain involving the residual left mandibular molar. Neutropenic fever. Sepsis.  EXAM: CT MAXILLOFACIAL WITHOUT CONTRAST  TECHNIQUE: Multidetector CT imaging of the maxillofacial structures was performed. Multiplanar CT image reconstructions were also generated. A small metallic BB was placed on the right temple in order to reliably differentiate right from left.  COMPARISON:  CT of the neck with contrast 09/01/2009.  FINDINGS: A dental caries is noted along the lateral margin of the crown of the residual left mandibular molar. Periapical lucency is noted about the posterior roots. Lateral soft tissue swelling or early subperiosteal abscess formation is noted with an area of low-density measuring 13 x 15 x 6 mm. The mandible is otherwise intact.  A prominent dental caries are noted in the residual right maxillary molar with significant periapical lucency and minimal gas along the lateral margin of the root of the tooth.  Mandible is otherwise intact and located.  Mild mucosal thickening is present along the medial inferior aspect of the left maxillary sinus. There is soft tissue opacification of anterior left ethmoid air cells and the inferior left  frontal sinus. The opacification partially occludes the superior nasal cavity on the left is well.  The mastoid air cells are clear.  Limited imaging the brain demonstrates mild atrophy, within normal limits for age.  IMPRESSION: 1. Lateral alveolar soft tissue swelling adjacent to the residual left mandibular molar with an area of low-density representing early or resolving subperiosteal abscess as described. 2. Prominent dental caries involving the right maxillary tooth with periapical lucency and some gas formation lateral to the tooth root. 3. Soft tissue opacification anterior ethmoid air cells extending into the superior nasal cavity. While this may represent a benign polyp, neoplasm or early fungal infection is also considered. Recommend ENT consultation for direct visualization. The lesion was not present in 2010.   Electronically Signed   By: San Morelle M.D.   On: 03/12/2015 13:30    Microbiology: Recent Results (from the past 240 hour(s))  Urine Culture     Status: None   Collection Time: 03/11/15 11:45 AM  Result Value Ref Range Status   Urine Culture, Routine Culture, Urine  Final    Comment: Final - ===== COLONY COUNT: ===== NO GROWTH NO GROWTH   Culture, blood (routine x 2)     Status: None   Collection Time: 03/11/15  3:23 PM  Result Value Ref Range Status   Specimen Description BLOOD LEFT WRIST  Final   Special Requests IN PEDIATRIC BOTTLE 3CC  Final   Culture   Final    NO GROWTH 5 DAYS Performed at [facility]    Report Status 03/16/2015 FINAL  Final  Culture, blood (routine x 2)     Status: None   Collection Time: 03/11/15  3:44 PM  Result Value Ref Range Status   Specimen Description BLOOD LEFT ANTECUBITAL  Final   Special Requests BOTTLES DRAWN AEROBIC AND ANAEROBIC 5CC  Final   Culture   Final    NO GROWTH 5 DAYS Performed at [facility]    Report Status 03/16/2015 FINAL  Final  Urine culture     Status: None   Collection Time:  03/11/15  5:18 PM  Result Value Ref Range Status   Specimen Description URINE, CLEAN CATCH  Final   Special Requests NONE  Final   Culture   Final    3,000 COLONIES/mL INSIGNIFICANT GROWTH Performed at [facility]    Report Status 03/12/2015 FINAL  Final  MRSA PCR Screening     Status: None   Collection Time: 03/11/15  6:28 PM  Result Value Ref Range Status   MRSA by PCR NEGATIVE NEGATIVE Final    Comment:        The GeneXpert MRSA Assay (FDA approved for NASAL specimens only), is one component of a comprehensive MRSA colonization surveillance program. It is not intended to diagnose MRSA infection nor to guide or monitor treatment for MRSA infections.   Urine culture     Status: None   Collection Time: 03/11/15  9:30 PM  Result Value Ref Range Status   Specimen Description URINE, CLEAN CATCH  Final   Special Requests NONE  Final   Culture   Final    NO GROWTH 1 DAY Performed at [facility]    Report Status 03/13/2015 FINAL  Final     Labs: Basic Metabolic Panel:  Recent Labs Lab 03/15/15 1717 03/16/15 0600 03/17/15 0520 03/18/15 0527 03/19/15 0500  NA 129* 132* 132* 131* 133*  K 3.9 3.5 3.7 3.6 3.8  CL 101 101 102 100* 102  CO2 _0 GLUCOSE 211* 82 132* 121* 81  BUN 22* _1 21*  CREATININE 0.96 0.88 0.84 0.85 1.21  CALCIUM 7.4* 7.7* 7.8* 7.8* 7.7*   Liver Function Tests:  Recent Labs Lab 03/19/15 0500  AST 62*  ALT 41  ALKPHOS 80  BILITOT 1.2  PROT 5.8*  ALBUMIN 1.7*   No results for input(s): LIPASE, AMYLASE in the last 168 hours. No results for input(s): AMMONIA in the last 168 hours. CBC:  Recent Labs Lab 03/15/15 0600 03/16/15 0600 03/17/15 0520 03/18/15 0527 03/19/15 0500 03/20/15 0600  WBC 2.3* 2.5* 2.2* 2.2* 1.7* 1.9*  NEUTROABS 0.5* 0.3* 0.8*  --   --  0.5*  HGB 9.1* 9.4* 8.5* 8.4* 8.4* 7.0*  HCT 24.7* 26.0* 23.4* 23.1* 23.2* 19.2*  MCV 88.2 88.4 87.3 87.8 87.5 86.5  PLT 43* 19* 9* 13* 6* 18*    Cardiac Enzymes: No results for input(s): CKTOTAL, CKMB, CKMBINDEX, TROPONINI in the last 168 hours. BNP: BNP (last 3 results) No results for input(s): BNP in the last 8760 hours.  ProBNP (last 3 results) No results for input(s): PROBNP in the last 8760 hours.  CBG:  Recent Labs Lab 03/20/15 1218 03/20/15 1640 03/20/15 2136 03/21/15 0006 03/21/15 0754  GLUCAP 157* 263* 256* 189* 121*       SignedDebbe Odea, MD [facility] 03/21/2015, 10:44 AM

## 2015-03-21 NOTE — Progress Notes (Signed)
Pt for discharge to [facility] [place].   CSW facilitated pt discharge needs including contacting facility, faxing pt discharge information via [facility], discussing with pt and pt wife at bedside, providing RN phone number to call report, and arranging ambulance transport for pt to [facility] [place].   CSW notified pt wife that [facility] does not provide pt CPAP, but pt wife states that she can get pt CPAP from home to provide to the facility.  CSW provided support to pt family members surrounding transition to SNF.   No further social work needs identified at this time.  CSW signing off.   Alison Murray, MSW, [place] Work [phone]

## 2015-03-21 NOTE — Progress Notes (Signed)
CSW received consult to place referral for [facility] for pt.   CSW made referral to [facility] [place].   CSW discussed with pt daughter, Lattie Haw at bedside who reports that pt wife is on the way to the hospital and pt daughter confirmed wishes for [facility] [place]. CSW notified pt daughter that CSW made referral to [facility] [place] and facility may be in contact with pt family.   CSW received notification from [facility] [place] that facility plan to speak with pt family, but if pt family in agreement then pt can transfer to [facility] [place] today.   Per MD, pt is stable for transport to [facility] [place].   CSW to follow up with pt family after their discussion with [facility].   CSW to continue to follow.  Alison Murray, MSW, [place] Work [phone]

## 2015-03-21 NOTE — Progress Notes (Signed)
I had a long talk with [name] and his wife this morning. It is apparent that his prognosis is going to be incredibly limited. He has, in essence, bone marrow failure. I'm sure that transfusions really are going to be beneficial for him.  We did discuss CODE STATUS. I explained to him and his wife that if he were to be put on life support, that he would never come off. His body is just too weak. There are be a very high risk of bleeding. After explaining things to he and his wife, he decided that he does not want to be kept alive on a machine. he just wants to be Comfortable. I totally agree with this. As such, he is a DO NOT RESUSCITATE.  I really think that the his decline over the past week is highly indicative of the future. I really don't think that he is going to make it more than 2 weeks. He just has declined so rapidly. He really is not eating. He has temperatures. I suspect that the temperatures probably from his underlying blood disorder. 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  I appreciate everybody's help with him.  On his physical exam today, he is afebrile. His pulse is 1:30. Blood pressure 108/61. Eyes exam, his cardiac exam is tachycardia irregular. Lungs show some decreased breath sounds in the bases. Abdomen is soft. Bowel sounds are decreased. Extremities shows some swelling in the right arm and right leg.  Again, I spent over 30 minutes with he and his wife. I wanted to make sure that I spent time with them so that they understood exactly what was going on. He has exhausted all therapeutic options. His bone marrow has failed him.  I just don't see that we need to be transfusing him. I just do not think that transfusions are going to help his quality of life.  Hopefully, he will be oh to get up to [facility]. They [place]. Again it would be very convenient for his family to be able to see him and be with him.  At this point, I  just want to make sure that he has comfort, respect and dignity. From my point of view, there is no limit to how much pain medicine he should get.  Lum Keas  2 Timothy 4:16-18

## 2015-03-28 ENCOUNTER — Ambulatory Visit: Payer: Self-pay

## 2015-04-12 DEATH — deceased

## 2015-05-26 ENCOUNTER — Ambulatory Visit: Payer: Self-pay | Admitting: Internal Medicine

## 2015-07-08 ENCOUNTER — Other Ambulatory Visit: Payer: Self-pay | Admitting: Nurse Practitioner

## 2016-02-08 LAB — PULMONARY FUNCTION TEST
DL/VA % pred: 67 %
DL/VA: 3.18 ml/min/mmHg/L
DLCO cor % pred: 50 %
DLCO cor: 17.53 ml/min/mmHg
DLCO unc % pred: 47 %
DLCO unc: 16.75 ml/min/mmHg
FEF 25-75 Post: 1.63 L/sec
FEF 25-75 Pre: 1.76 L/sec
FEF2575-%Change-Post: -7 %
FEF2575-%Pred-Post: 66 %
FEF2575-%Pred-Pre: 72 %
FEV1-%Change-Post: 0 %
FEV1-%Pred-Post: 76 %
FEV1-%Pred-Pre: 76 %
FEV1-Post: 2.28 L
FEV1-Pre: 2.3 L
FEV1FVC-%Change-Post: 7 %
FEV1FVC-%Pred-Pre: 98 %
FEV6-%Change-Post: -7 %
FEV6-%Pred-Post: 74 %
FEV6-%Pred-Pre: 80 %
FEV6-Post: 2.88 L
FEV6-Pre: 3.11 L
FEV6FVC-%Pred-Post: 105 %
FEV6FVC-%Pred-Pre: 105 %
FVC-%Change-Post: -7 %
FVC-%Pred-Post: 71 %
FVC-%Pred-Pre: 76 %
FVC-Post: 2.88 L
Post FEV1/FVC ratio: 79 %
Post FEV6/FVC ratio: 100 %
Pre FEV1/FVC ratio: 74 %
Pre FEV6/FVC Ratio: 100 %
RV % pred: 110 %
RV: 2.95 L
TLC % pred: 83 %
TLC: 6.19 L

## 2024-01-31 NOTE — Progress Notes (Signed)
 This encounter was created in error - please disregard.
